# Patient Record
Sex: Female | Born: 1940 | Race: Black or African American | Hispanic: No | Smoking: Never smoker
Health system: Southern US, Community
[De-identification: ages and names within clinical notes are randomized; demographics above are authoritative.]

## PROBLEM LIST (undated history)

## (undated) DIAGNOSIS — K219 Gastro-esophageal reflux disease without esophagitis: Secondary | ICD-10-CM

## (undated) DIAGNOSIS — M1990 Unspecified osteoarthritis, unspecified site: Secondary | ICD-10-CM

## (undated) DIAGNOSIS — E119 Type 2 diabetes mellitus without complications: Secondary | ICD-10-CM

## (undated) DIAGNOSIS — Z9289 Personal history of other medical treatment: Secondary | ICD-10-CM

## (undated) DIAGNOSIS — I10 Essential (primary) hypertension: Secondary | ICD-10-CM

## (undated) DIAGNOSIS — R0600 Dyspnea, unspecified: Secondary | ICD-10-CM

## (undated) DIAGNOSIS — D126 Benign neoplasm of colon, unspecified: Secondary | ICD-10-CM

## (undated) DIAGNOSIS — Z950 Presence of cardiac pacemaker: Secondary | ICD-10-CM

## (undated) DIAGNOSIS — E785 Hyperlipidemia, unspecified: Secondary | ICD-10-CM

## (undated) DIAGNOSIS — I442 Atrioventricular block, complete: Secondary | ICD-10-CM

## (undated) HISTORY — PX: JOINT REPLACEMENT: SHX530

## (undated) HISTORY — PX: TUBAL LIGATION: SHX77

## (undated) HISTORY — PX: COLONOSCOPY: SHX5424

## (undated) HISTORY — DX: Benign neoplasm of colon, unspecified: D12.6

## (undated) HISTORY — DX: Personal history of other medical treatment: Z92.89

---

## 1999-02-28 ENCOUNTER — Inpatient Hospital Stay (HOSPITAL_COMMUNITY): Admission: EM | Admit: 1999-02-28 | Discharge: 1999-03-02 | Payer: Self-pay | Admitting: *Deleted

## 2003-06-27 ENCOUNTER — Emergency Department (HOSPITAL_COMMUNITY): Admission: EM | Admit: 2003-06-27 | Discharge: 2003-06-27 | Payer: Self-pay | Admitting: *Deleted

## 2003-06-27 ENCOUNTER — Encounter: Payer: Self-pay | Admitting: *Deleted

## 2004-12-13 ENCOUNTER — Emergency Department (HOSPITAL_COMMUNITY): Admission: EM | Admit: 2004-12-13 | Discharge: 2004-12-14 | Payer: Self-pay | Admitting: *Deleted

## 2006-03-08 ENCOUNTER — Ambulatory Visit (HOSPITAL_COMMUNITY): Admission: RE | Admit: 2006-03-08 | Discharge: 2006-03-08 | Payer: Self-pay | Admitting: Family Medicine

## 2006-03-29 ENCOUNTER — Ambulatory Visit (HOSPITAL_COMMUNITY): Admission: RE | Admit: 2006-03-29 | Discharge: 2006-03-29 | Payer: Self-pay | Admitting: Family Medicine

## 2006-10-25 ENCOUNTER — Ambulatory Visit (HOSPITAL_COMMUNITY): Admission: RE | Admit: 2006-10-25 | Discharge: 2006-10-25 | Payer: Self-pay | Admitting: Family Medicine

## 2007-03-26 ENCOUNTER — Encounter: Admission: RE | Admit: 2007-03-26 | Discharge: 2007-03-26 | Payer: Self-pay | Admitting: Family Medicine

## 2008-02-17 ENCOUNTER — Emergency Department (HOSPITAL_COMMUNITY): Admission: EM | Admit: 2008-02-17 | Discharge: 2008-02-17 | Payer: Self-pay | Admitting: Emergency Medicine

## 2008-03-26 ENCOUNTER — Encounter: Admission: RE | Admit: 2008-03-26 | Discharge: 2008-03-26 | Payer: Self-pay | Admitting: Family Medicine

## 2008-11-25 ENCOUNTER — Ambulatory Visit (HOSPITAL_COMMUNITY): Admission: RE | Admit: 2008-11-25 | Discharge: 2008-11-25 | Payer: Self-pay | Admitting: Gastroenterology

## 2008-11-25 ENCOUNTER — Encounter: Payer: Self-pay | Admitting: Gastroenterology

## 2008-11-25 ENCOUNTER — Ambulatory Visit: Payer: Self-pay | Admitting: Gastroenterology

## 2009-04-08 ENCOUNTER — Ambulatory Visit (HOSPITAL_COMMUNITY): Admission: RE | Admit: 2009-04-08 | Discharge: 2009-04-08 | Payer: Self-pay | Admitting: Family Medicine

## 2009-07-20 ENCOUNTER — Ambulatory Visit: Payer: Self-pay | Admitting: Orthopedic Surgery

## 2009-07-20 DIAGNOSIS — M1710 Unilateral primary osteoarthritis, unspecified knee: Secondary | ICD-10-CM

## 2009-07-20 DIAGNOSIS — IMO0002 Reserved for concepts with insufficient information to code with codable children: Secondary | ICD-10-CM | POA: Insufficient documentation

## 2009-07-20 DIAGNOSIS — I10 Essential (primary) hypertension: Secondary | ICD-10-CM

## 2009-07-20 DIAGNOSIS — M25569 Pain in unspecified knee: Secondary | ICD-10-CM | POA: Insufficient documentation

## 2009-07-20 DIAGNOSIS — E119 Type 2 diabetes mellitus without complications: Secondary | ICD-10-CM | POA: Insufficient documentation

## 2009-10-19 ENCOUNTER — Ambulatory Visit: Payer: Self-pay | Admitting: Orthopedic Surgery

## 2009-11-13 ENCOUNTER — Telehealth: Payer: Self-pay | Admitting: Orthopedic Surgery

## 2010-04-12 ENCOUNTER — Ambulatory Visit (HOSPITAL_COMMUNITY): Admission: RE | Admit: 2010-04-12 | Discharge: 2010-04-12 | Payer: Self-pay | Admitting: Internal Medicine

## 2011-04-11 ENCOUNTER — Other Ambulatory Visit (HOSPITAL_COMMUNITY): Payer: Self-pay | Admitting: Internal Medicine

## 2011-04-11 DIAGNOSIS — Z139 Encounter for screening, unspecified: Secondary | ICD-10-CM

## 2011-04-14 ENCOUNTER — Ambulatory Visit (HOSPITAL_COMMUNITY)
Admission: RE | Admit: 2011-04-14 | Discharge: 2011-04-14 | Disposition: A | Discharge status: Home / Self Care | Payer: Medicare Other | Source: Ambulatory Visit | Attending: Internal Medicine | Admitting: Internal Medicine

## 2011-04-14 DIAGNOSIS — Z1231 Encounter for screening mammogram for malignant neoplasm of breast: Secondary | ICD-10-CM | POA: Insufficient documentation

## 2011-04-14 DIAGNOSIS — Z139 Encounter for screening, unspecified: Secondary | ICD-10-CM

## 2011-05-10 NOTE — Op Note (Signed)
NAMEGIBSON, Natalie Castro                  ACCOUNT NO.:  [id number]   MEDICAL RECORD NO.:  [id number]          PATIENT TYPE:  AMB   LOCATION:  DAY                           FACILITY:  [facility]   PHYSICIAN:  Kassie Mends, M.D.      DATE OF BIRTH:  [birth year]   DATE OF PROCEDURE:  11/25/2008  DATE OF DISCHARGE:                                PROCEDURE NOTE   REFERRING PHYSICIAN:  Scott A. Gerda Diss, MD   PROCEDURE:  Colonoscopy with snare cautery polypectomy.   INDICATION FOR EXAMINATION:  [name] is a 70 year old female who  presents for average-risk colon cancer screening.   FINDINGS:  1. A 6 mm sessile hepatic flexure polyp removed via cold forceps.  2. A 6-mm pedunculated descending colon polyp removed via snare      cautery.  A 4-mm sessile descending colon polyp removed via snare      cautery.  3. Frequent sigmoid colon diverticula.  Otherwise, no masses,      inflammatory changes, or AVM seen.  4. Normal retroflexed view of the rectum.   RECOMMENDATIONS:  1. Will call [name] with the results of her biopsies.  If she has a      simple adenoma then a screening colonoscopy in 10 years.  2. She should follow a high-fiber diet.  She was given a handout on      high-fiber diet and diverticulosis.  3. No aspirin, NSAIDS, or anticoagulation for 7 days.   PROCEDURE TECHNIQUE:  Physical exam was performed.  Informed consent was  obtained from the patient after explaining the benefits, risks, and  alternatives to the procedure.  The patient was connected to the monitor  and placed in the left lateral position.  Continuous oxygen was provided  by nasal cannula.  IV medicine was administered through an indwelling  cannula.  After administration of sedation and rectal exam, the  patient's rectum was intubated and the scope was advanced under direct  visualization to the cecum.  The scope was removed slowly  by carefully examining the color, texture, anatomy, and integrity of the  mucosa on the  way out.  The patient was recovered in Endoscopy and  discharged home in satisfactory condition.   PATH:  Simple adenomas. TCS in 10 yrs.      Kassie Mends, M.D.  Electronically Signed     SM/MEDQ  D:  11/25/2008  T:  11/25/2008  Job:  [id number]   cc:   Lorin Picket A. Gerda Diss, MD  Fax: [phone]

## 2011-09-16 LAB — BASIC METABOLIC PANEL
BUN: 15
CO2: 29
Calcium: 8.7
Chloride: 100
Creatinine, Ser: 0.85
GFR calc Af Amer: >60
GFR calc non Af Amer: >60
Glucose, Bld: 276 — ABNORMAL HIGH
Potassium: 3.3 — ABNORMAL LOW
Sodium: 139

## 2011-09-16 LAB — DIFFERENTIAL
Basophils Absolute: 0
Basophils Relative: 0
Eosinophils Absolute: 0.1
Eosinophils Relative: 1
Lymphocytes Relative: 21
Lymphs Abs: 1.2
Monocytes Absolute: 0.3
Monocytes Relative: 5
Neutro Abs: 4.2
Neutrophils Relative %: 72

## 2011-09-16 LAB — CBC
HCT: 39.1
Hemoglobin: 13.3
MCHC: 34.1
MCV: 85.1
Platelets: 177
RBC: 4.59
RDW: 13.7
WBC: 5.8

## 2011-09-16 LAB — URINALYSIS, ROUTINE W REFLEX MICROSCOPIC
Glucose, UA: 250 — AB
Hgb urine dipstick: NEGATIVE
Ketones, ur: 15 — AB
Nitrite: NEGATIVE
Protein, ur: NEGATIVE
Specific Gravity, Urine: >1.03 — ABNORMAL HIGH
Urobilinogen, UA: 0.2
pH: 5.5

## 2011-09-30 LAB — GLUCOSE, CAPILLARY: Glucose-Capillary: 142 mg/dL — ABNORMAL HIGH (ref 70–99)

## 2012-03-13 ENCOUNTER — Other Ambulatory Visit (HOSPITAL_COMMUNITY): Payer: Self-pay | Admitting: Internal Medicine

## 2012-03-13 DIAGNOSIS — Z139 Encounter for screening, unspecified: Secondary | ICD-10-CM

## 2012-04-16 ENCOUNTER — Ambulatory Visit (HOSPITAL_COMMUNITY)
Admission: RE | Admit: 2012-04-16 | Discharge: 2012-04-16 | Disposition: A | Discharge status: Home / Self Care | Payer: Medicare Other | Source: Ambulatory Visit | Attending: Internal Medicine | Admitting: Internal Medicine

## 2012-04-16 DIAGNOSIS — Z139 Encounter for screening, unspecified: Secondary | ICD-10-CM

## 2012-04-16 DIAGNOSIS — Z1231 Encounter for screening mammogram for malignant neoplasm of breast: Secondary | ICD-10-CM | POA: Insufficient documentation

## 2013-04-29 ENCOUNTER — Other Ambulatory Visit (HOSPITAL_COMMUNITY): Payer: Self-pay | Admitting: Internal Medicine

## 2013-04-29 DIAGNOSIS — Z139 Encounter for screening, unspecified: Secondary | ICD-10-CM

## 2013-05-02 ENCOUNTER — Ambulatory Visit (HOSPITAL_COMMUNITY)
Admission: RE | Admit: 2013-05-02 | Discharge: 2013-05-02 | Disposition: A | Discharge status: Home / Self Care | Payer: Medicare Other | Source: Ambulatory Visit | Attending: Internal Medicine | Admitting: Internal Medicine

## 2013-05-02 DIAGNOSIS — Z1231 Encounter for screening mammogram for malignant neoplasm of breast: Secondary | ICD-10-CM | POA: Insufficient documentation

## 2013-05-02 DIAGNOSIS — Z139 Encounter for screening, unspecified: Secondary | ICD-10-CM

## 2014-04-24 ENCOUNTER — Other Ambulatory Visit (HOSPITAL_COMMUNITY): Payer: Self-pay | Admitting: Internal Medicine

## 2014-04-24 DIAGNOSIS — Z1231 Encounter for screening mammogram for malignant neoplasm of breast: Secondary | ICD-10-CM

## 2014-05-05 ENCOUNTER — Ambulatory Visit (HOSPITAL_COMMUNITY)
Admission: RE | Admit: 2014-05-05 | Discharge: 2014-05-05 | Disposition: A | Discharge status: Home / Self Care | Payer: Medicare Other | Source: Ambulatory Visit | Attending: Internal Medicine | Admitting: Internal Medicine

## 2014-05-05 DIAGNOSIS — Z1231 Encounter for screening mammogram for malignant neoplasm of breast: Secondary | ICD-10-CM | POA: Insufficient documentation

## 2015-01-13 DIAGNOSIS — E119 Type 2 diabetes mellitus without complications: Secondary | ICD-10-CM | POA: Diagnosis not present

## 2015-01-13 DIAGNOSIS — H5203 Hypermetropia, bilateral: Secondary | ICD-10-CM | POA: Diagnosis not present

## 2015-01-13 DIAGNOSIS — H52223 Regular astigmatism, bilateral: Secondary | ICD-10-CM | POA: Diagnosis not present

## 2015-01-13 DIAGNOSIS — H524 Presbyopia: Secondary | ICD-10-CM | POA: Diagnosis not present

## 2015-04-06 ENCOUNTER — Other Ambulatory Visit (HOSPITAL_COMMUNITY): Payer: Self-pay | Admitting: Internal Medicine

## 2015-04-06 DIAGNOSIS — Z1231 Encounter for screening mammogram for malignant neoplasm of breast: Secondary | ICD-10-CM

## 2015-04-09 DIAGNOSIS — E669 Obesity, unspecified: Secondary | ICD-10-CM | POA: Diagnosis not present

## 2015-04-09 DIAGNOSIS — M1990 Unspecified osteoarthritis, unspecified site: Secondary | ICD-10-CM | POA: Diagnosis not present

## 2015-04-09 DIAGNOSIS — E785 Hyperlipidemia, unspecified: Secondary | ICD-10-CM | POA: Diagnosis not present

## 2015-04-09 DIAGNOSIS — I10 Essential (primary) hypertension: Secondary | ICD-10-CM | POA: Diagnosis not present

## 2015-05-11 ENCOUNTER — Ambulatory Visit (HOSPITAL_COMMUNITY)
Admission: RE | Admit: 2015-05-11 | Discharge: 2015-05-11 | Disposition: A | Discharge status: Home / Self Care | Payer: Medicare Other | Source: Ambulatory Visit | Attending: Internal Medicine | Admitting: Internal Medicine

## 2015-05-11 DIAGNOSIS — Z1231 Encounter for screening mammogram for malignant neoplasm of breast: Secondary | ICD-10-CM | POA: Diagnosis not present

## 2015-06-18 ENCOUNTER — Encounter (HOSPITAL_COMMUNITY): Payer: Self-pay | Admitting: *Deleted

## 2015-06-18 ENCOUNTER — Emergency Department (HOSPITAL_COMMUNITY)
Admission: EM | Admit: 2015-06-18 | Discharge: 2015-06-19 | Disposition: A | Discharge status: Home / Self Care | Payer: Medicare Other | Attending: Emergency Medicine | Admitting: Emergency Medicine

## 2015-06-18 DIAGNOSIS — K5730 Diverticulosis of large intestine without perforation or abscess without bleeding: Secondary | ICD-10-CM | POA: Diagnosis not present

## 2015-06-18 DIAGNOSIS — R531 Weakness: Secondary | ICD-10-CM | POA: Diagnosis not present

## 2015-06-18 DIAGNOSIS — R110 Nausea: Secondary | ICD-10-CM | POA: Diagnosis not present

## 2015-06-18 DIAGNOSIS — Z880 Allergy status to penicillin: Secondary | ICD-10-CM | POA: Diagnosis not present

## 2015-06-18 DIAGNOSIS — R109 Unspecified abdominal pain: Secondary | ICD-10-CM | POA: Diagnosis not present

## 2015-06-18 DIAGNOSIS — M6281 Muscle weakness (generalized): Secondary | ICD-10-CM | POA: Diagnosis not present

## 2015-06-18 DIAGNOSIS — E119 Type 2 diabetes mellitus without complications: Secondary | ICD-10-CM | POA: Diagnosis not present

## 2015-06-18 DIAGNOSIS — R1031 Right lower quadrant pain: Secondary | ICD-10-CM | POA: Diagnosis not present

## 2015-06-18 DIAGNOSIS — K219 Gastro-esophageal reflux disease without esophagitis: Secondary | ICD-10-CM | POA: Diagnosis not present

## 2015-06-18 DIAGNOSIS — Z79899 Other long term (current) drug therapy: Secondary | ICD-10-CM | POA: Diagnosis not present

## 2015-06-18 DIAGNOSIS — I10 Essential (primary) hypertension: Secondary | ICD-10-CM | POA: Insufficient documentation

## 2015-06-18 DIAGNOSIS — R42 Dizziness and giddiness: Secondary | ICD-10-CM | POA: Insufficient documentation

## 2015-06-18 HISTORY — DX: Hyperlipidemia, unspecified: E78.5

## 2015-06-18 HISTORY — DX: Gastro-esophageal reflux disease without esophagitis: K21.9

## 2015-06-18 HISTORY — DX: Unspecified osteoarthritis, unspecified site: M19.90

## 2015-06-18 LAB — URINE MICROSCOPIC-ADD ON

## 2015-06-18 LAB — URINALYSIS, ROUTINE W REFLEX MICROSCOPIC
Bilirubin Urine: NEGATIVE
Glucose, UA: NEGATIVE mg/dL
Hgb urine dipstick: NEGATIVE
Nitrite: NEGATIVE
PH: 6 (ref 5.0–8.0)
Protein, ur: NEGATIVE mg/dL
Specific Gravity, Urine: 1.02 (ref 1.005–1.030)
Urobilinogen, UA: 0.2 mg/dL (ref 0.0–1.0)

## 2015-06-18 NOTE — ED Notes (Signed)
Pt reporting pain in right flank area moving into lower abdomen starting about an hour ago.  Reporting some nausea, denies vomiting.  Also reporting small amount of generalized weakness.

## 2015-06-18 NOTE — ED Provider Notes (Signed)
CSN: [id number]     Arrival date & time 06/18/15  2029 History  This chart was scribed for Jola Schmidt, MD by Rayfield Citizen, ED Scribe. This patient was seen in room [facility] and the patient's care was started at 11:43 PM.    Chief Complaint  Patient presents with  . Flank Pain   The history is provided by the patient. No language interpreter was used.     HPI Comments: Natalie Castro is a 74 y.o. female with past medical history of DM, HTN, HLD who presents to the Emergency Department complaining of sudden onset right flank pain, radiating into her RLQ, beginning around 20:00 tonight. Patient notes associated nausea, lightheadedness, and mild generalized weakness. She explains that her pain lasted for approximately 10 minutes before resolving without treatment. She denies vomiting. Patient denies prior history of kidney stones, denies experience of discomfort after eating.   Past Medical History  Diagnosis Date  . Diabetes mellitus without complication   . Hypertension   . Arthritis   . Hyperlipidemia   . Acid reflux    Past Surgical History  Procedure Laterality Date  . Tubal ligation    . Joint replacement     History reviewed. No pertinent family history. History  Substance Use Topics  . Smoking status: Never Smoker   . Smokeless tobacco: Not on file  . Alcohol Use: No   OB History    No data available     Review of Systems  A complete 10 system review of systems was obtained and all systems are negative except as noted in the HPI and PMH.    Allergies  Penicillins  Home Medications   Prior to Admission medications   Medication Sig Start Date End Date Taking? Authorizing Provider  hydrALAZINE (APRESOLINE) 50 MG tablet Take 50 mg by mouth 3 (three) times daily.   Yes Historical Provider, MD  metFORMIN (GLUCOPHAGE) 1000 MG tablet Take 1,000 mg by mouth 2 (two) times daily with a meal.   Yes Historical Provider, MD  metoCLOPramide (REGLAN) 5 MG tablet Take 5 mg by  mouth 4 (four) times daily.   Yes Historical Provider, MD  NIFEdipine (ADALAT CC) 90 MG 24 hr tablet Take 90 mg by mouth daily.   Yes Historical Provider, MD  ranitidine (ZANTAC) 300 MG capsule Take 300 mg by mouth every evening.   Yes Historical Provider, MD   BP 148/70 mmHg  Pulse 78  Temp(Src) 98.7 F (37.1 C) (Oral)  Resp 18  Ht 5\' 4"  (1.626 m)  Wt 196 lb (88.905 kg)  BMI 33.63 kg/m2  SpO2 99% Physical Exam  Constitutional: She is oriented to person, place, and time. She appears well-developed and well-nourished. No distress.  HENT:  Head: Normocephalic and atraumatic.  Eyes: EOM are normal.  Neck: Normal range of motion.  Cardiovascular: Normal rate, regular rhythm and normal heart sounds.   Pulmonary/Chest: Effort normal and breath sounds normal.  Abdominal: Soft. She exhibits no distension. There is tenderness (Mild right-sided abdominal tenderness). There is no rebound and no guarding.  Musculoskeletal: Normal range of motion.  Neurological: She is alert and oriented to person, place, and time.  Skin: Skin is warm and dry.  Psychiatric: She has a normal mood and affect. Judgment normal.  Nursing note and vitals reviewed.   ED Course  Procedures   DIAGNOSTIC STUDIES: Oxygen Saturation is 100% on RA, normal by my interpretation.    COORDINATION OF CARE: 11:47 PM Discussed treatment plan with pt  at bedside and pt agreed to plan.   Labs Review Labs Reviewed  URINALYSIS, ROUTINE W REFLEX MICROSCOPIC (NOT AT [facility]) - Abnormal; Notable for the following:    Ketones, ur TRACE (*)    Leukocytes, UA SMALL (*)    All other components within normal limits  CBC WITH DIFFERENTIAL/PLATELET - Abnormal; Notable for the following:    RBC 3.28 (*)    Hemoglobin 11.4 (*)    HCT 34.1 (*)    MCV 104.0 (*)    MCH 34.8 (*)    All other components within normal limits  COMPREHENSIVE METABOLIC PANEL - Abnormal; Notable for the following:    Potassium 3.3 (*)    Glucose, Bld 162 (*)     Calcium 8.6 (*)    Albumin 3.4 (*)    AST 13 (*)    ALT 11 (*)    All other components within normal limits  URINE MICROSCOPIC-ADD ON - Abnormal; Notable for the following:    Squamous Epithelial / LPF MANY (*)    Bacteria, UA MANY (*)    Casts HYALINE CASTS (*)    All other components within normal limits  LIPASE, BLOOD    Imaging Review Ct Abdomen Pelvis W Contrast  06/19/2015   CLINICAL DATA:  Sudden onset right flank pain radiating into the right lower quadrant.  EXAM: CT ABDOMEN AND PELVIS WITH CONTRAST  TECHNIQUE: Multidetector CT imaging of the abdomen and pelvis was performed using the standard protocol following bolus administration of intravenous contrast.  CONTRAST:  5mL OMNIPAQUE IOHEXOL 300 MG/ML SOLN, 16mL OMNIPAQUE IOHEXOL 300 MG/ML SOLN  COMPARISON:  None.  FINDINGS: Lower chest: Normal heart size. Dependent atelectasis and or scarring within the right lower lobe.  Hepatobiliary: 2 adjacent cysts are identified within the right hepatic lobe measuring up to 2.6 cm. Additional smaller low-attenuation hepatic lesions are identified, too small to characterize. Gallbladder is decompressed. No intrahepatic or extrahepatic biliary ductal dilatation.  Pancreas: Unremarkable  Spleen: Unremarkable  Adrenals/Urinary Tract: Adrenal glands are normal. Kidneys enhance symmetrically with contrast. No hydronephrosis. Urinary bladder is unremarkable.  Stomach/Bowel: Sigmoid colonic diverticulosis. No CT evidence for acute diverticulitis. Normal appendix. No free fluid or free intraperitoneal air.  Vascular/Lymphatic: Normal caliber abdominal aorta. No retroperitoneal lymphadenopathy.  Other: Uterus is unremarkable. Multiple calcifications demonstrated within the adnexal structures bilaterally.  Musculoskeletal: Diastases of the rectus abdominus musculature. Lower lumbar spine degenerative changes. Possible sebaceous cyst left gluteal fat (image 63; series 2)  IMPRESSION: No acute process within  the abdomen or pelvis.  Sigmoid colonic diverticulosis without evidence for acute diverticulitis. Normal appendix.   Electronically Signed   By: Lovey Newcomer M.D.   On: 06/19/2015 01:51  I personally reviewed the imaging tests through PACS system I reviewed available ER/hospitalization records through the EMR    EKG Interpretation None      MDM   Final diagnoses:  Abdominal pain, unspecified abdominal location    2:10 AM Pt is feeling better at this time. Vitals are normal. Ct without acute pathology. Labs and urine without abnormality. Given the acute nature of this one considers ureteral colic that resolved prior to evaluation. Well appearing now. Dc home. pcp follow up. Understands to return to ER for new or worsening symptoms   I personally performed the services described in this documentation, which was scribed in my presence. The recorded information has been reviewed and is accurate.       Jola Schmidt, MD 06/19/15 [phone]

## 2015-06-19 ENCOUNTER — Emergency Department (HOSPITAL_COMMUNITY): Payer: Medicare Other

## 2015-06-19 DIAGNOSIS — K5730 Diverticulosis of large intestine without perforation or abscess without bleeding: Secondary | ICD-10-CM | POA: Diagnosis not present

## 2015-06-19 LAB — LIPASE, BLOOD: LIPASE: 27 U/L (ref 22–51)

## 2015-06-19 LAB — COMPREHENSIVE METABOLIC PANEL
ALT: 11 U/L — ABNORMAL LOW (ref 14–54)
ANION GAP: 7 (ref 5–15)
AST: 13 U/L — ABNORMAL LOW (ref 15–41)
Albumin: 3.4 g/dL — ABNORMAL LOW (ref 3.5–5.0)
Alkaline Phosphatase: 80 U/L (ref 38–126)
BILIRUBIN TOTAL: 0.5 mg/dL (ref 0.3–1.2)
BUN: 15 mg/dL (ref 6–20)
CHLORIDE: 106 mmol/L (ref 101–111)
CO2: 28 mmol/L (ref 22–32)
CREATININE: 0.76 mg/dL (ref 0.44–1.00)
Calcium: 8.6 mg/dL — ABNORMAL LOW (ref 8.9–10.3)
GFR calc Af Amer: >60 mL/min (ref >60)
GFR calc non Af Amer: >60 mL/min (ref >60)
Glucose, Bld: 162 mg/dL — ABNORMAL HIGH (ref 65–99)
Potassium: 3.3 mmol/L — ABNORMAL LOW (ref 3.5–5.1)
Sodium: 141 mmol/L (ref 135–145)
Total Protein: 7.6 g/dL (ref 6.5–8.1)

## 2015-06-19 LAB — CBC WITH DIFFERENTIAL/PLATELET
Basophils Absolute: 0 10*3/uL (ref 0.0–0.1)
Basophils Relative: 0 % (ref 0–1)
EOS ABS: 0.2 10*3/uL (ref 0.0–0.7)
Eosinophils Relative: 2 % (ref 0–5)
HCT: 34.1 % — ABNORMAL LOW (ref 36.0–46.0)
Hemoglobin: 11.4 g/dL — ABNORMAL LOW (ref 12.0–15.0)
LYMPHS ABS: 3 10*3/uL (ref 0.7–4.0)
LYMPHS PCT: 33 % (ref 12–46)
MCH: 34.8 pg — ABNORMAL HIGH (ref 26.0–34.0)
MCHC: 33.4 g/dL (ref 30.0–36.0)
MCV: 104 fL — AB (ref 78.0–100.0)
Monocytes Absolute: 0.5 10*3/uL (ref 0.1–1.0)
Monocytes Relative: 6 % (ref 3–12)
NEUTROS ABS: 5.3 10*3/uL (ref 1.7–7.7)
NEUTROS PCT: 59 % (ref 43–77)
PLATELETS: 204 10*3/uL (ref 150–400)
RBC: 3.28 MIL/uL — AB (ref 3.87–5.11)
RDW: 14.3 % (ref 11.5–15.5)
WBC: 9 10*3/uL (ref 4.0–10.5)

## 2015-06-19 MED ORDER — IOHEXOL 300 MG/ML  SOLN
100.0000 mL | Freq: Once | INTRAMUSCULAR | Status: AC | PRN
  Administered 2015-06-19: 100 mL via INTRAVENOUS

## 2015-06-19 MED ORDER — IOHEXOL 300 MG/ML  SOLN
25.0000 mL | Freq: Once | INTRAMUSCULAR | Status: AC | PRN
  Administered 2015-06-19: 25 mL via ORAL

## 2015-06-19 NOTE — Discharge Instructions (Signed)

## 2015-07-02 DIAGNOSIS — R531 Weakness: Secondary | ICD-10-CM | POA: Diagnosis not present

## 2015-07-02 DIAGNOSIS — E119 Type 2 diabetes mellitus without complications: Secondary | ICD-10-CM | POA: Diagnosis not present

## 2015-07-02 DIAGNOSIS — E669 Obesity, unspecified: Secondary | ICD-10-CM | POA: Diagnosis not present

## 2015-07-02 DIAGNOSIS — I973 Postprocedural hypertension: Secondary | ICD-10-CM | POA: Diagnosis not present

## 2015-07-02 DIAGNOSIS — M545 Low back pain: Secondary | ICD-10-CM | POA: Diagnosis not present

## 2015-07-02 DIAGNOSIS — E785 Hyperlipidemia, unspecified: Secondary | ICD-10-CM | POA: Diagnosis not present

## 2015-07-02 DIAGNOSIS — E1140 Type 2 diabetes mellitus with diabetic neuropathy, unspecified: Secondary | ICD-10-CM | POA: Diagnosis not present

## 2015-07-06 DIAGNOSIS — I10 Essential (primary) hypertension: Secondary | ICD-10-CM | POA: Diagnosis not present

## 2015-07-06 DIAGNOSIS — E1140 Type 2 diabetes mellitus with diabetic neuropathy, unspecified: Secondary | ICD-10-CM | POA: Diagnosis not present

## 2015-07-06 DIAGNOSIS — E785 Hyperlipidemia, unspecified: Secondary | ICD-10-CM | POA: Diagnosis not present

## 2015-07-06 DIAGNOSIS — E669 Obesity, unspecified: Secondary | ICD-10-CM | POA: Diagnosis not present

## 2015-10-05 DIAGNOSIS — Z23 Encounter for immunization: Secondary | ICD-10-CM | POA: Diagnosis not present

## 2015-10-05 DIAGNOSIS — I10 Essential (primary) hypertension: Secondary | ICD-10-CM | POA: Diagnosis not present

## 2015-10-05 DIAGNOSIS — E669 Obesity, unspecified: Secondary | ICD-10-CM | POA: Diagnosis not present

## 2015-10-05 DIAGNOSIS — E1140 Type 2 diabetes mellitus with diabetic neuropathy, unspecified: Secondary | ICD-10-CM | POA: Diagnosis not present

## 2015-10-05 DIAGNOSIS — E785 Hyperlipidemia, unspecified: Secondary | ICD-10-CM | POA: Diagnosis not present

## 2015-10-05 DIAGNOSIS — E1165 Type 2 diabetes mellitus with hyperglycemia: Secondary | ICD-10-CM | POA: Diagnosis not present

## 2015-11-23 ENCOUNTER — Inpatient Hospital Stay
Admission: AD | Admit: 2015-11-23 | Payer: Medicare Other | Source: Other Acute Inpatient Hospital | Admitting: Cardiology

## 2015-11-23 ENCOUNTER — Inpatient Hospital Stay (HOSPITAL_COMMUNITY)
Admission: EM | Admit: 2015-11-23 | Discharge: 2015-11-25 | DRG: 244 | Disposition: A | Discharge status: Home / Self Care | Payer: Medicare Other | Attending: Internal Medicine | Admitting: Internal Medicine

## 2015-11-23 ENCOUNTER — Emergency Department (HOSPITAL_COMMUNITY): Payer: Medicare Other

## 2015-11-23 ENCOUNTER — Encounter (HOSPITAL_COMMUNITY): Payer: Self-pay | Admitting: Emergency Medicine

## 2015-11-23 DIAGNOSIS — E7800 Pure hypercholesterolemia, unspecified: Secondary | ICD-10-CM | POA: Diagnosis not present

## 2015-11-23 DIAGNOSIS — I10 Essential (primary) hypertension: Secondary | ICD-10-CM | POA: Diagnosis not present

## 2015-11-23 DIAGNOSIS — Z7984 Long term (current) use of oral hypoglycemic drugs: Secondary | ICD-10-CM | POA: Diagnosis not present

## 2015-11-23 DIAGNOSIS — Z79899 Other long term (current) drug therapy: Secondary | ICD-10-CM

## 2015-11-23 DIAGNOSIS — R0602 Shortness of breath: Secondary | ICD-10-CM | POA: Diagnosis not present

## 2015-11-23 DIAGNOSIS — R001 Bradycardia, unspecified: Secondary | ICD-10-CM | POA: Diagnosis not present

## 2015-11-23 DIAGNOSIS — R55 Syncope and collapse: Secondary | ICD-10-CM | POA: Diagnosis not present

## 2015-11-23 DIAGNOSIS — Z880 Allergy status to penicillin: Secondary | ICD-10-CM | POA: Diagnosis not present

## 2015-11-23 DIAGNOSIS — I442 Atrioventricular block, complete: Principal | ICD-10-CM

## 2015-11-23 DIAGNOSIS — E785 Hyperlipidemia, unspecified: Secondary | ICD-10-CM

## 2015-11-23 DIAGNOSIS — K219 Gastro-esophageal reflux disease without esophagitis: Secondary | ICD-10-CM | POA: Diagnosis not present

## 2015-11-23 DIAGNOSIS — E119 Type 2 diabetes mellitus without complications: Secondary | ICD-10-CM | POA: Diagnosis not present

## 2015-11-23 DIAGNOSIS — Z96653 Presence of artificial knee joint, bilateral: Secondary | ICD-10-CM | POA: Diagnosis not present

## 2015-11-23 DIAGNOSIS — R0600 Dyspnea, unspecified: Secondary | ICD-10-CM | POA: Diagnosis not present

## 2015-11-23 DIAGNOSIS — Z959 Presence of cardiac and vascular implant and graft, unspecified: Secondary | ICD-10-CM

## 2015-11-23 DIAGNOSIS — Z8249 Family history of ischemic heart disease and other diseases of the circulatory system: Secondary | ICD-10-CM

## 2015-11-23 DIAGNOSIS — Z6834 Body mass index (BMI) 34.0-34.9, adult: Secondary | ICD-10-CM | POA: Diagnosis not present

## 2015-11-23 DIAGNOSIS — Z7982 Long term (current) use of aspirin: Secondary | ICD-10-CM | POA: Diagnosis not present

## 2015-11-23 DIAGNOSIS — E669 Obesity, unspecified: Secondary | ICD-10-CM | POA: Diagnosis present

## 2015-11-23 DIAGNOSIS — J90 Pleural effusion, not elsewhere classified: Secondary | ICD-10-CM | POA: Diagnosis not present

## 2015-11-23 DIAGNOSIS — Z950 Presence of cardiac pacemaker: Secondary | ICD-10-CM | POA: Diagnosis not present

## 2015-11-23 HISTORY — DX: Essential (primary) hypertension: I10

## 2015-11-23 HISTORY — DX: Atrioventricular block, complete: I44.2

## 2015-11-23 HISTORY — DX: Type 2 diabetes mellitus without complications: E11.9

## 2015-11-23 LAB — CBG MONITORING, ED: Glucose-Capillary: 94 mg/dL (ref 65–99)

## 2015-11-23 LAB — CBC WITH DIFFERENTIAL/PLATELET
BASOS ABS: 0 10*3/uL (ref 0.0–0.1)
Basophils Relative: 0 %
Eosinophils Absolute: 0.2 10*3/uL (ref 0.0–0.7)
Eosinophils Relative: 2 %
HEMATOCRIT: 33.9 % — AB (ref 36.0–46.0)
Hemoglobin: 11.1 g/dL — ABNORMAL LOW (ref 12.0–15.0)
LYMPHS PCT: 34 %
Lymphs Abs: 3.1 10*3/uL (ref 0.7–4.0)
MCH: 29.2 pg (ref 26.0–34.0)
MCHC: 32.7 g/dL (ref 30.0–36.0)
MCV: 89.2 fL (ref 78.0–100.0)
MONO ABS: 0.9 10*3/uL (ref 0.1–1.0)
Monocytes Relative: 10 %
NEUTROS ABS: 4.9 10*3/uL (ref 1.7–7.7)
Neutrophils Relative %: 54 %
Platelets: 234 10*3/uL (ref 150–400)
RBC: 3.8 MIL/uL — AB (ref 3.87–5.11)
RDW: 13.4 % (ref 11.5–15.5)
WBC: 9.1 10*3/uL (ref 4.0–10.5)

## 2015-11-23 LAB — APTT: APTT: 25 s (ref 24–37)

## 2015-11-23 LAB — I-STAT CHEM 8, ED
BUN: 25 mg/dL — AB (ref 6–20)
Calcium, Ion: 1.12 mmol/L — ABNORMAL LOW (ref 1.13–1.30)
Chloride: 108 mmol/L (ref 101–111)
Creatinine, Ser: 0.8 mg/dL (ref 0.44–1.00)
Glucose, Bld: 119 mg/dL — ABNORMAL HIGH (ref 65–99)
HEMATOCRIT: 36 % (ref 36.0–46.0)
Hemoglobin: 12.2 g/dL (ref 12.0–15.0)
POTASSIUM: 4 mmol/L (ref 3.5–5.1)
Sodium: 145 mmol/L (ref 135–145)
TCO2: 20 mmol/L (ref 0–100)

## 2015-11-23 LAB — BASIC METABOLIC PANEL
ANION GAP: 9 (ref 5–15)
BUN: 23 mg/dL — ABNORMAL HIGH (ref 6–20)
CO2: 21 mmol/L — ABNORMAL LOW (ref 22–32)
Calcium: 8.5 mg/dL — ABNORMAL LOW (ref 8.9–10.3)
Chloride: 112 mmol/L — ABNORMAL HIGH (ref 101–111)
Creatinine, Ser: 0.87 mg/dL (ref 0.44–1.00)
GFR calc Af Amer: >60 mL/min (ref >60)
GFR calc non Af Amer: >60 mL/min (ref >60)
GLUCOSE: 122 mg/dL — AB (ref 65–99)
POTASSIUM: 3.5 mmol/L (ref 3.5–5.1)
Sodium: 142 mmol/L (ref 135–145)

## 2015-11-23 LAB — I-STAT TROPONIN, ED: Troponin i, poc: 0.02 ng/mL (ref 0.00–0.08)

## 2015-11-23 LAB — TROPONIN I

## 2015-11-23 LAB — TSH: TSH: 0.713 u[IU]/mL (ref 0.350–4.500)

## 2015-11-23 LAB — PROTIME-INR
INR: 1.19 (ref 0.00–1.49)
PROTHROMBIN TIME: 15.3 s — AB (ref 11.6–15.2)

## 2015-11-23 LAB — MAGNESIUM: Magnesium: 1.2 mg/dL — ABNORMAL LOW (ref 1.7–2.4)

## 2015-11-23 MED ORDER — ATROPINE SULFATE 0.1 MG/ML IJ SOLN
INTRAMUSCULAR | Status: AC
  Filled 2015-11-23: qty 10

## 2015-11-23 MED ORDER — FAMOTIDINE 20 MG PO TABS
20.0000 mg | ORAL_TABLET | Freq: Every day | ORAL | Status: DC
  Administered 2015-11-25: 20 mg via ORAL
  Filled 2015-11-23: qty 1

## 2015-11-23 MED ORDER — ATORVASTATIN CALCIUM 40 MG PO TABS: 40.0000 mg | ORAL_TABLET | Freq: Every day | ORAL | Status: DC

## 2015-11-23 MED ORDER — PANTOPRAZOLE SODIUM 40 MG PO TBEC
40.0000 mg | DELAYED_RELEASE_TABLET | Freq: Every day | ORAL | Status: DC
  Administered 2015-11-23 – 2015-11-25 (×2): 40 mg via ORAL
  Filled 2015-11-23 (×2): qty 1

## 2015-11-23 MED ORDER — LOSARTAN POTASSIUM 50 MG PO TABS
100.0000 mg | ORAL_TABLET | Freq: Every day | ORAL | Status: DC
  Filled 2015-11-23 (×2): qty 2

## 2015-11-23 MED ORDER — LOSARTAN POTASSIUM 25 MG PO TABS: 25.0000 mg | ORAL_TABLET | Freq: Every day | ORAL | Status: DC

## 2015-11-23 MED ORDER — ASPIRIN 81 MG PO CHEW
81.0000 mg | CHEWABLE_TABLET | Freq: Once | ORAL | Status: AC
  Administered 2015-11-23: 81 mg via ORAL
  Filled 2015-11-23: qty 1

## 2015-11-23 MED ORDER — METOCLOPRAMIDE HCL 5 MG PO TABS
5.0000 mg | ORAL_TABLET | Freq: Three times a day (TID) | ORAL | Status: DC
  Administered 2015-11-23 – 2015-11-25 (×5): 5 mg via ORAL
  Filled 2015-11-23 (×5): qty 1

## 2015-11-23 MED ORDER — HYDRALAZINE HCL 25 MG PO TABS: 25.0000 mg | ORAL_TABLET | Freq: Two times a day (BID) | ORAL | Status: DC

## 2015-11-23 MED ORDER — ATORVASTATIN CALCIUM 40 MG PO TABS
40.0000 mg | ORAL_TABLET | Freq: Every day | ORAL | Status: DC
  Administered 2015-11-23 – 2015-11-24 (×2): 40 mg via ORAL
  Filled 2015-11-23 (×4): qty 1

## 2015-11-23 MED ORDER — MAGNESIUM SULFATE 4 GM/100ML IV SOLN
4.0000 g | Freq: Once | INTRAVENOUS | Status: AC
  Administered 2015-11-23: 4 g via INTRAVENOUS
  Filled 2015-11-23: qty 100

## 2015-11-23 MED ORDER — HYDRALAZINE HCL 25 MG PO TABS
25.0000 mg | ORAL_TABLET | Freq: Three times a day (TID) | ORAL | Status: DC
  Administered 2015-11-23: 25 mg via ORAL
  Filled 2015-11-23 (×7): qty 1

## 2015-11-23 MED ORDER — METFORMIN HCL 500 MG PO TABS: 1000.0000 mg | ORAL_TABLET | Freq: Two times a day (BID) | ORAL | Status: DC

## 2015-11-23 MED ORDER — METFORMIN HCL 500 MG PO TABS
1000.0000 mg | ORAL_TABLET | Freq: Two times a day (BID) | ORAL | Status: DC
  Filled 2015-11-23: qty 2

## 2015-11-23 NOTE — ED Provider Notes (Signed)
CSN: [id number]     Arrival date & time 11/23/15  1527 History   First MD Initiated Contact with Patient 11/23/15 1533     Chief Complaint  Patient presents with  . Shortness of Breath    Patient is a 74 y.o. female presenting with shortness of breath. The history is provided by the patient and a relative.  Shortness of Breath Severity:  Moderate Onset quality:  Gradual Duration: several days. Timing:  Intermittent Progression:  Worsening Chronicity:  New Relieved by:  Rest Worsened by:  Activity Associated symptoms: syncope   Associated symptoms: no abdominal pain, no chest pain, no fever and no vomiting   Patient reports over past several days she had dyspnea on exertion and fatigue/weakness She reports syncopal episode a month ago, none since No CP No fever/vomiting No new meds She started feeling worse last week and had an appointment with PCP Today who told her to go to the ER She has no previous cardiac hhistory   Past Medical History  Diagnosis Date  . Diabetes mellitus without complication ([place])   . Hypertension   . Arthritis   . Hyperlipidemia   . Acid reflux    Past Surgical History  Procedure Laterality Date  . Tubal ligation    . Joint replacement     History reviewed. No pertinent family history. Social History  Substance Use Topics  . Smoking status: Never Smoker   . Smokeless tobacco: None  . Alcohol Use: No   OB History    No data available     Review of Systems  Constitutional: Positive for fatigue. Negative for fever.  Respiratory: Positive for shortness of breath.   Cardiovascular: Positive for syncope. Negative for chest pain.  Gastrointestinal: Negative for vomiting, abdominal pain and blood in stool.  Neurological: Positive for syncope and weakness.  All other systems reviewed and are negative.     Allergies  Penicillins  Home Medications   Prior to Admission medications   Medication Sig Start Date End Date Taking?  Authorizing Provider  hydrALAZINE (APRESOLINE) 50 MG tablet Take 50 mg by mouth 3 (three) times daily.    Historical Provider, MD  metFORMIN (GLUCOPHAGE) 1000 MG tablet Take 1,000 mg by mouth 2 (two) times daily with a meal.    Historical Provider, MD  metoCLOPramide (REGLAN) 5 MG tablet Take 5 mg by mouth 4 (four) times daily.    Historical Provider, MD  NIFEdipine (ADALAT CC) 90 MG 24 hr tablet Take 90 mg by mouth daily.    Historical Provider, MD  ranitidine (ZANTAC) 300 MG capsule Take 300 mg by mouth every evening.    Historical Provider, MD   BP 155/64 mmHg  Pulse 39  Temp(Src) 97.8 F (36.6 C) (Oral)  Resp 28  Ht 5\' 4"  (1.626 m)  Wt 93.895 kg  BMI 35.51 kg/m2  SpO2 100% Physical Exam CONSTITUTIONAL: Well developed/well nourished HEAD: Normocephalic/atraumatic EYES: EOMI ENMT: Mucous membranes moist NECK: supple no meningeal signs SPINE/BACK:entire spine nontender CV: bradycardic, no murmurs/rubs/gallops noted LUNGS: Lungs are clear to auscultation bilaterally, no apparent distress ABDOMEN: soft, nontender, no rebound or guarding, bowel sounds noted throughout abdomen NEURO: Pt is awake/alert/appropriate, moves all extremitiesx4.  No facial droop.   EXTREMITIES: pulses normal/equal, full ROM SKIN: warm, color normal PSYCH: no abnormalities of mood noted, alert and oriented to situation  ED Course  Procedures  CRITICAL CARE Performed by: Sharyon Cable Total critical care time: 33 minutes Critical care time was exclusive of separately billable  procedures and treating other patients. Critical care was necessary to treat or prevent imminent or life-threatening deterioration. Critical care was time spent personally by me on the following activities: development of treatment plan with patient and/or surrogate as well as nursing, discussions with consultants, evaluation of patient's response to treatment, examination of patient, obtaining history from patient or surrogate,  ordering and performing treatments and interventions, ordering and review of laboratory studies, ordering and review of radiographic studies, pulse oximetry and re-evaluation of patient's condition. PATIENT WITH COMPLETE HEART BLOCK, PT REQUIRED CONSTANT MONITORING, CONSULTED CARDIOLOGY AND REQUIRES TRANSFER TO CARDIAC CENTER  4:04 PM D/w dr Domenic Polite cardiology He reviewed EKG Pt has complete heart block He will arrange transfer to [facility] 4:24 PM Cardiology now evaluating patient for transfer/admission Pt currently without hypotension, she is awake/alert, no distress Will have monitor/pacer at bedside prior to transfer   Labs Review Labs Reviewed  CBC WITH DIFFERENTIAL/PLATELET - Abnormal; Notable for the following:    RBC 3.80 (*)    Hemoglobin 11.1 (*)    HCT 33.9 (*)    All other components within normal limits  I-STAT CHEM 8, ED - Abnormal; Notable for the following:    BUN 25 (*)    Glucose, Bld 119 (*)    Calcium, Ion 1.12 (*)    All other components within normal limits  BASIC METABOLIC PANEL  I-STAT TROPOININ, ED    Imaging Review Dg Chest Portable 1 View  11/23/2015  CLINICAL DATA:  Shortness of breath, dizzy spells for a month. Blacked out. EXAM: PORTABLE CHEST 1 VIEW COMPARISON:  02/17/2008 FINDINGS: There is no focal parenchymal opacity. There is no pleural effusion or pneumothorax. The heart and mediastinal contours are unremarkable. There is mild stable cardiomegaly. IMPRESSION: No active disease. Electronically Signed   By: Kathreen Devoid   On: 11/23/2015 16:15   I have personally reviewed and evaluated these images and lab results as part of my medical decision-making.   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

## 2015-11-23 NOTE — ED Notes (Signed)
Pt states she feels fine as long as she is not moving around. Denies any pain. No needs voiced at this time. Waiting on transport.
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
## 2015-11-23 NOTE — ED Notes (Signed)
Pt states that she has been short of breath for the past few months.  States that she went to the doctor today and they told her to come here for evaluation.

## 2015-11-23 NOTE — ED Notes (Signed)
Cardiology at bedside.

## 2015-11-23 NOTE — Progress Notes (Addendum)
Please see note by Dr. Domenic Polite signed early today 11/28. In extreme brevis, pt is a 74 yo F w/ a PMH significant for HTN and DMII p/w exertional fatigue, dyspnea, and presyncopal symptoms. Symptomatology have been going on for 1 month. Pt passed out on one occasion. Today presented to OSH ER w/ CHB w/ junctional vs. high ventricular escape rhythm in the 30s. BP stable. Asymptomatic at rest. Labs notable for neg CE and TSH WNL. Not on AV nodal agents. Given stable escape rhythm at rest, do not anticipate any overnight events. Will perform TTE to assess for DC PPM vs. CRT-P based on BLOCK-HF. 1.) Admit to ICU/telemetry, transcutaneous pacer pads/atropine at bedside 2.) Hold all AV nodal agents and anti-HTN (not on any at home) 3.) Bed rest 4.) NPO after midnight 5.) AM labs 6.) TTE in AM 7.) DC PPM vs. CRT-P  Jay Schlichter, MD
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
## 2015-11-24 ENCOUNTER — Inpatient Hospital Stay (HOSPITAL_COMMUNITY): Payer: Medicare Other

## 2015-11-24 ENCOUNTER — Encounter (HOSPITAL_COMMUNITY)
Admission: EM | Disposition: A | Discharge status: Home / Self Care | Payer: Medicare Other | Source: Home / Self Care | Attending: Cardiology

## 2015-11-24 DIAGNOSIS — R0600 Dyspnea, unspecified: Secondary | ICD-10-CM

## 2015-11-24 DIAGNOSIS — I442 Atrioventricular block, complete: Secondary | ICD-10-CM

## 2015-11-24 HISTORY — PX: EP IMPLANTABLE DEVICE: SHX172B

## 2015-11-24 LAB — TROPONIN I: Troponin I: 0.03 ng/mL (ref <0.031)

## 2015-11-24 LAB — CBC
HEMATOCRIT: 31 % — AB (ref 36.0–46.0)
HEMOGLOBIN: 10 g/dL — AB (ref 12.0–15.0)
MCH: 28.6 pg (ref 26.0–34.0)
MCHC: 32.3 g/dL (ref 30.0–36.0)
MCV: 88.6 fL (ref 78.0–100.0)
Platelets: 192 10*3/uL (ref 150–400)
RBC: 3.5 MIL/uL — AB (ref 3.87–5.11)
RDW: 13.5 % (ref 11.5–15.5)
WBC: 6.8 10*3/uL (ref 4.0–10.5)

## 2015-11-24 LAB — BASIC METABOLIC PANEL
ANION GAP: 9 (ref 5–15)
BUN: 16 mg/dL (ref 6–20)
CO2: 21 mmol/L — ABNORMAL LOW (ref 22–32)
Calcium: 8.5 mg/dL — ABNORMAL LOW (ref 8.9–10.3)
Chloride: 112 mmol/L — ABNORMAL HIGH (ref 101–111)
Creatinine, Ser: 0.8 mg/dL (ref 0.44–1.00)
GFR calc Af Amer: >60 mL/min (ref >60)
GLUCOSE: 162 mg/dL — AB (ref 65–99)
POTASSIUM: 4.1 mmol/L (ref 3.5–5.1)
Sodium: 142 mmol/L (ref 135–145)

## 2015-11-24 LAB — MAGNESIUM: MAGNESIUM: 2.2 mg/dL (ref 1.7–2.4)

## 2015-11-24 LAB — HEMOGLOBIN A1C
Hgb A1c MFr Bld: 7.6 % — ABNORMAL HIGH (ref 4.8–5.6)
MEAN PLASMA GLUCOSE: 171 mg/dL

## 2015-11-24 LAB — PROTIME-INR
INR: 1.13 (ref 0.00–1.49)
Prothrombin Time: 14.7 seconds (ref 11.6–15.2)

## 2015-11-24 LAB — MRSA PCR SCREENING: MRSA BY PCR: NEGATIVE

## 2015-11-24 SURGERY — PACEMAKER IMPLANT

## 2015-11-24 MED ORDER — CHLORHEXIDINE GLUCONATE 4 % EX LIQD
60.0000 mL | Freq: Once | CUTANEOUS | Status: AC
  Administered 2015-11-24: 4 via TOPICAL

## 2015-11-24 MED ORDER — YOU HAVE A PACEMAKER BOOK
Freq: Once | Status: AC
  Administered 2015-11-25: 02:00:00
  Filled 2015-11-24: qty 1

## 2015-11-24 MED ORDER — HEPARIN (PORCINE) IN NACL 2-0.9 UNIT/ML-% IJ SOLN
INTRAMUSCULAR | Status: AC
  Filled 2015-11-24: qty 500

## 2015-11-24 MED ORDER — ACETAMINOPHEN 325 MG PO TABS: 325.0000 mg | ORAL_TABLET | ORAL | Status: DC | PRN

## 2015-11-24 MED ORDER — SODIUM CHLORIDE 0.9 % IR SOLN
80.0000 mg | Status: AC
  Administered 2015-11-24: 80 mg
  Filled 2015-11-24: qty 2

## 2015-11-24 MED ORDER — LIDOCAINE HCL (PF) 1 % IJ SOLN
INTRAMUSCULAR | Status: AC
  Filled 2015-11-24: qty 30

## 2015-11-24 MED ORDER — SODIUM CHLORIDE 0.9 % IV SOLN
INTRAVENOUS | Status: DC
  Administered 2015-11-24: 11:00:00 via INTRAVENOUS

## 2015-11-24 MED ORDER — LIDOCAINE HCL (PF) 1 % IJ SOLN
INTRAMUSCULAR | Status: AC
  Filled 2015-11-24: qty 60

## 2015-11-24 MED ORDER — CHLORHEXIDINE GLUCONATE 4 % EX LIQD
60.0000 mL | Freq: Once | CUTANEOUS | Status: AC
  Filled 2015-11-24: qty 60

## 2015-11-24 MED ORDER — LIDOCAINE HCL (PF) 1 % IJ SOLN
INTRAMUSCULAR | Status: DC | PRN
  Administered 2015-11-24: 60 mL

## 2015-11-24 MED ORDER — ONDANSETRON HCL 4 MG/2ML IJ SOLN: 4.0000 mg | Freq: Four times a day (QID) | INTRAMUSCULAR | Status: DC | PRN

## 2015-11-24 MED ORDER — SODIUM CHLORIDE 0.9 % IV SOLN
1000.0000 mg | INTRAVENOUS | Status: DC | PRN
  Administered 2015-11-24: 1000 mg via INTRAVENOUS

## 2015-11-24 MED ORDER — VANCOMYCIN HCL IN DEXTROSE 1-5 GM/200ML-% IV SOLN
1000.0000 mg | Freq: Two times a day (BID) | INTRAVENOUS | Status: AC
  Administered 2015-11-25: 1000 mg via INTRAVENOUS
  Filled 2015-11-24: qty 200

## 2015-11-24 MED ORDER — SODIUM CHLORIDE 0.9 % IJ SOLN: 3.0000 mL | INTRAMUSCULAR | Status: DC | PRN

## 2015-11-24 MED ORDER — IOHEXOL 350 MG/ML SOLN
INTRAVENOUS | Status: DC | PRN
  Administered 2015-11-24: 15 mL via INTRAVENOUS

## 2015-11-24 MED ORDER — HYDROCODONE-ACETAMINOPHEN 5-325 MG PO TABS: 1.0000 | ORAL_TABLET | ORAL | Status: DC | PRN

## 2015-11-24 MED ORDER — VANCOMYCIN HCL IN DEXTROSE 1-5 GM/200ML-% IV SOLN
1000.0000 mg | INTRAVENOUS | Status: DC
Start: 2015-11-24 — End: 2015-11-24
  Filled 2015-11-24: qty 200

## 2015-11-24 MED ORDER — SODIUM CHLORIDE 0.9 % IJ SOLN
3.0000 mL | Freq: Two times a day (BID) | INTRAMUSCULAR | Status: DC
  Administered 2015-11-24 – 2015-11-25 (×2): 3 mL via INTRAVENOUS

## 2015-11-24 MED ORDER — CETYLPYRIDINIUM CHLORIDE 0.05 % MT LIQD
7.0000 mL | Freq: Two times a day (BID) | OROMUCOSAL | Status: DC
  Administered 2015-11-24: 7 mL via OROMUCOSAL

## 2015-11-24 MED ORDER — SODIUM CHLORIDE 0.9 % IR SOLN
Status: AC
  Filled 2015-11-24: qty 2

## 2015-11-24 MED ORDER — SODIUM CHLORIDE 0.9 % IV SOLN: 250.0000 mL | INTRAVENOUS | Status: DC | PRN

## 2015-11-24 SURGICAL SUPPLY — 7 items
CABLE SURGICAL S-101-97-12 (CABLE) ×4 IMPLANT
LEAD CAPSURE NOVUS 5076-58CM (Lead) ×2 IMPLANT
LEAD TENDRIL SDX 2088TC-46CM (Lead) ×2 IMPLANT
PAD DEFIB LIFELINK (PAD) ×2 IMPLANT
PPM ASSURITY DR PM2240 (Pacemaker) ×2 IMPLANT
SHEATH CLASSIC 7F (SHEATH) ×4 IMPLANT
TRAY PACEMAKER INSERTION (PACKS) ×2 IMPLANT
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
## 2015-11-24 NOTE — Care Management Note (Signed)
Case Management Note  Patient Details  Name: Natalie Castro MRN: [id number] Date of Birth: 1941/08/07  Subjective/Objective:     Adm w complete heart block               Action/Plan: lives w husband, pcp [name]   Expected Discharge Date:                  Expected Discharge Plan:     In-House Referral:     Discharge planning Services     Post Acute Care Choice:    Choice offered to:     DME Arranged:    DME Agency:     HH Arranged:    [place] Agency:     Status of Service:     Medicare Important Message Given:    Date Medicare IM Given:    Medicare IM give by:    Date Additional Medicare IM Given:    Additional Medicare Important Message give by:     If discussed at [place] of Stay Meetings, dates discussed:    Additional Comments: ur review done  Lacretia Leigh, RN 11/24/2015, 8:47 AM

## 2015-11-24 NOTE — Progress Notes (Signed)
   11/24/15 1200  Clinical Encounter Type  Visited With Patient and family together  Visit Type Initial;Spiritual support  Referral From Chaplain;Nurse  Spiritual Encounters  Spiritual Needs Literature;Prayer  Chaplain visited with Pt and family members; Chaplain ask about Pt overall well being; Chaplain said a prayer while leaving the room; Pt did ask for a bible, Family chimed in a said that she already had a bible of her own; Family and Pt was very appreciative to my visit
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
## 2015-11-24 NOTE — Progress Notes (Signed)
Patient transferred to tele post pacemaker placement. Personal belongings given to daughter. Teeth, article of clothing and bible.

## 2015-11-24 NOTE — Interval H&P Note (Signed)
History and Physical Interval Note:  11/24/2015 10:22 AM  Natalie Castro  has presented today for surgery, with the diagnosis of hb  The various methods of treatment have been discussed with the patient and family. After consideration of risks, benefits and other options for treatment, the patient has consented to  Procedure(s): Pacemaker Implant (N/A) as a surgical intervention .  The patient's history has been reviewed, patient examined, no change in status, stable for surgery.  I have reviewed the patient's chart and labs.  Questions were answered to the patient's satisfaction.     [name]

## 2015-11-24 NOTE — Discharge Instructions (Signed)
° ° °  Supplemental Discharge Instructions for  Pacemaker/Defibrillator Patients  Activity No heavy lifting or vigorous activity with your left/right arm for 6 to 8 weeks.  Do not raise your left/right arm above your head for one week.  Gradually raise your affected arm as drawn below.           __        11-28-15               11-29-15                   11-30-15                  12-01-15  NO DRIVING   WOUND CARE - Keep the wound area clean and dry.  Do not get this area wet for one week. No showers for one week; you may shower on  12-01-15   . - The tape/steri-strips on your wound will fall off; do not pull them off.  No bandage is needed on the site.  DO  NOT apply any creams, oils, or ointments to the wound area. - If you notice any drainage or discharge from the wound, any swelling or bruising at the site, or you develop a fever > 101? F after you are discharged home, call the office at once.  Special Instructions - You are still able to use cellular telephones; use the ear opposite the side where you have your pacemaker/defibrillator.  Avoid carrying your cellular phone near your device. - When traveling through airports, show security personnel your identification card to avoid being screened in the metal detectors.  Ask the security personnel to use the hand wand. - Avoid arc welding equipment, MRI testing (magnetic resonance imaging), TENS units (transcutaneous nerve stimulators).  Call the office for questions about other devices. - Avoid electrical appliances that are in poor condition or are not properly grounded. - Microwave ovens are safe to be near or to operate.

## 2015-11-24 NOTE — Progress Notes (Signed)
  Echocardiogram 2D Echocardiogram has been performed.  Natalie Castro 11/24/2015, 10:17 AM

## 2015-11-25 ENCOUNTER — Other Ambulatory Visit: Payer: Self-pay | Admitting: Physician Assistant

## 2015-11-25 ENCOUNTER — Inpatient Hospital Stay (HOSPITAL_COMMUNITY): Payer: Medicare Other

## 2015-11-25 ENCOUNTER — Encounter (HOSPITAL_COMMUNITY): Payer: Self-pay | Admitting: Internal Medicine

## 2015-11-25 DIAGNOSIS — E876 Hypokalemia: Secondary | ICD-10-CM

## 2015-11-25 LAB — BASIC METABOLIC PANEL
ANION GAP: 8 (ref 5–15)
BUN: 11 mg/dL (ref 6–20)
CO2: 24 mmol/L (ref 22–32)
Calcium: 8.1 mg/dL — ABNORMAL LOW (ref 8.9–10.3)
Chloride: 108 mmol/L (ref 101–111)
Creatinine, Ser: 0.77 mg/dL (ref 0.44–1.00)
GFR calc Af Amer: >60 mL/min (ref >60)
GLUCOSE: 166 mg/dL — AB (ref 65–99)
POTASSIUM: 3.3 mmol/L — AB (ref 3.5–5.1)
Sodium: 140 mmol/L (ref 135–145)

## 2015-11-25 LAB — GLUCOSE, CAPILLARY: Glucose-Capillary: 154 mg/dL — ABNORMAL HIGH (ref 65–99)

## 2015-11-25 MED ORDER — METFORMIN HCL 500 MG PO TABS
1000.0000 mg | ORAL_TABLET | Freq: Two times a day (BID) | ORAL | Status: DC
  Administered 2015-11-25: 1000 mg via ORAL
  Filled 2015-11-25: qty 2

## 2015-11-25 MED ORDER — POTASSIUM CHLORIDE CRYS ER 20 MEQ PO TBCR
40.0000 meq | EXTENDED_RELEASE_TABLET | Freq: Once | ORAL | Status: AC
  Administered 2015-11-25: 40 meq via ORAL
  Filled 2015-11-25 (×2): qty 2

## 2015-11-25 MED FILL — Heparin Sodium (Porcine) 2 Unit/ML in Sodium Chloride 0.9%: INTRAMUSCULAR | Qty: 500 | Status: AC

## 2015-11-25 MED FILL — Lidocaine HCl Local Preservative Free (PF) Inj 1%: INTRAMUSCULAR | Qty: 30 | Status: AC

## 2015-11-25 NOTE — Progress Notes (Signed)
Reviewed discharge instructions with patient and daughter, verbalize understanding. Pain free, pacer site with steri strips clean, dry & intact.  Portable bedside commode sent home with patient.   York Spaniel

## 2015-11-25 NOTE — Care Management Note (Signed)
Case Management Note Previous CM note initiated by Donne Anon RN CM on 11/24/15  Patient Details  Name: DESHAUNDRA HILMAN MRN: [id number] Date of Birth: [birth year]  Subjective/Objective:     Adm w complete heart block               Action/Plan: lives w husband, pcp [name]   Expected Discharge Date:      11/25/15            Expected Discharge Plan:  Home/Self Care  In-House Referral:     Discharge planning Services  CM Consult  Post Acute Care Choice:    Choice offered to:     DME Arranged:    DME Agency:     HH Arranged:    [place]:     Status of Service:  Completed, signed off  Medicare Important Message Given:    Date Medicare IM Given:    Medicare IM give by:    Date Additional Medicare IM Given:    Additional Medicare Important Message give by:     If discussed at [place] of Stay Meetings, dates discussed:    Additional Comments: ur review done  Dawayne Patricia, RN 11/25/2015, 9:50 AM

## 2015-11-25 NOTE — Progress Notes (Signed)
Patient  And daughter  Viewed the pacemaker video.  Mervyn Skeeters, RN

## 2015-11-25 NOTE — Progress Notes (Addendum)
Left leg and left hand remain edematous and warm. Good pulses. :fob up and left leg up on 2 pillows. Left hand up on pillow

## 2015-12-07 ENCOUNTER — Encounter: Payer: Self-pay | Admitting: Internal Medicine

## 2015-12-07 ENCOUNTER — Ambulatory Visit (INDEPENDENT_AMBULATORY_CARE_PROVIDER_SITE_OTHER): Payer: Medicare Other | Admitting: *Deleted

## 2015-12-07 DIAGNOSIS — I442 Atrioventricular block, complete: Secondary | ICD-10-CM | POA: Diagnosis not present

## 2015-12-07 LAB — CUP PACEART INCLINIC DEVICE CHECK
Battery Remaining Longevity: 76.8
Brady Statistic RA Percent Paced: 0.14 %
Brady Statistic RV Percent Paced: 99.81 %
Date Time Interrogation Session: 20161212143613
Implantable Lead Implant Date: 20161129
Implantable Lead Location: 753859
Lead Channel Impedance Value: 400 Ohm
Lead Channel Impedance Value: 475 Ohm
Lead Channel Pacing Threshold Amplitude: 1 V
Lead Channel Pacing Threshold Pulse Width: 0.4 ms
Lead Channel Sensing Intrinsic Amplitude: 5 mV
MDC IDC LEAD IMPLANT DT: 20161129
MDC IDC LEAD LOCATION: 753860
MDC IDC MSMT BATTERY VOLTAGE: 3.01 V
MDC IDC MSMT LEADCHNL RV PACING THRESHOLD AMPLITUDE: 0.75 V
MDC IDC MSMT LEADCHNL RV PACING THRESHOLD AMPLITUDE: 0.75 V
MDC IDC MSMT LEADCHNL RV PACING THRESHOLD PULSEWIDTH: 0.4 ms
MDC IDC MSMT LEADCHNL RV PACING THRESHOLD PULSEWIDTH: 0.4 ms
MDC IDC MSMT LEADCHNL RV SENSING INTR AMPL: 9.6 mV
MDC IDC PG SERIAL: 7826037
MDC IDC SET LEADCHNL RA PACING AMPLITUDE: 2 V
MDC IDC SET LEADCHNL RV PACING AMPLITUDE: 3.5 V
MDC IDC SET LEADCHNL RV PACING PULSEWIDTH: 0.4 ms
MDC IDC SET LEADCHNL RV SENSING SENSITIVITY: 4 mV

## 2015-12-07 NOTE — Progress Notes (Signed)
Wound check appointment. Steri-strips removed. Wound without redness or edema. Incision edges approximated, wound well healed. Normal device function. Thresholds, sensing, and impedances consistent with implant measurements. Device programmed at 3.5V (RV) and auto capture is programmed on (RA) for extra safety margin until 3 month visit. Histogram distribution appropriate for patient and level of activity. (1) mode switch episode x 8 sec @ 192/70--AT. No high ventricular rates noted. Patient educated about wound care, arm mobility, lifting restrictions. ROV in 3 months with GT/R.

## 2015-12-27 DIAGNOSIS — Z86711 Personal history of pulmonary embolism: Secondary | ICD-10-CM

## 2015-12-27 HISTORY — DX: Personal history of pulmonary embolism: Z86.711

## 2016-01-11 DIAGNOSIS — E785 Hyperlipidemia, unspecified: Secondary | ICD-10-CM | POA: Diagnosis not present

## 2016-01-11 DIAGNOSIS — E1140 Type 2 diabetes mellitus with diabetic neuropathy, unspecified: Secondary | ICD-10-CM | POA: Diagnosis not present

## 2016-01-11 DIAGNOSIS — Z950 Presence of cardiac pacemaker: Secondary | ICD-10-CM | POA: Diagnosis not present

## 2016-01-11 DIAGNOSIS — I10 Essential (primary) hypertension: Secondary | ICD-10-CM | POA: Diagnosis not present

## 2016-01-11 DIAGNOSIS — I442 Atrioventricular block, complete: Secondary | ICD-10-CM | POA: Diagnosis not present

## 2016-01-11 DIAGNOSIS — Z23 Encounter for immunization: Secondary | ICD-10-CM | POA: Diagnosis not present

## 2016-01-11 DIAGNOSIS — E1165 Type 2 diabetes mellitus with hyperglycemia: Secondary | ICD-10-CM | POA: Diagnosis not present

## 2016-01-15 DIAGNOSIS — H524 Presbyopia: Secondary | ICD-10-CM | POA: Diagnosis not present

## 2016-01-15 DIAGNOSIS — H52223 Regular astigmatism, bilateral: Secondary | ICD-10-CM | POA: Diagnosis not present

## 2016-01-15 DIAGNOSIS — H5203 Hypermetropia, bilateral: Secondary | ICD-10-CM | POA: Diagnosis not present

## 2016-01-15 DIAGNOSIS — E113293 Type 2 diabetes mellitus with mild nonproliferative diabetic retinopathy without macular edema, bilateral: Secondary | ICD-10-CM | POA: Diagnosis not present

## 2016-02-01 ENCOUNTER — Encounter: Payer: Self-pay | Admitting: Internal Medicine

## 2016-03-04 ENCOUNTER — Ambulatory Visit (INDEPENDENT_AMBULATORY_CARE_PROVIDER_SITE_OTHER): Payer: Medicare Other | Admitting: Internal Medicine

## 2016-03-04 ENCOUNTER — Encounter: Payer: Self-pay | Admitting: Internal Medicine

## 2016-03-04 VITALS — BP 122/72 | HR 95 | Ht 65.0 in | Wt 201.0 lb

## 2016-03-04 DIAGNOSIS — I442 Atrioventricular block, complete: Secondary | ICD-10-CM | POA: Diagnosis not present

## 2016-03-04 LAB — CUP PACEART INCLINIC DEVICE CHECK
Date Time Interrogation Session: 20170328080158
Implantable Lead Implant Date: 20161129
MDC IDC LEAD IMPLANT DT: 20161129
MDC IDC LEAD LOCATION: 753859
MDC IDC LEAD LOCATION: 753860
MDC IDC PG SERIAL: 7826037
Pulse Gen Model: 2240

## 2016-03-04 NOTE — Patient Instructions (Signed)
Your physician wants you to follow-up in: 9 Months with Dr. Lovena Le.  You will receive a reminder letter in the mail two months in advance. If you don't receive a letter, please call our office to schedule the follow-up appointment.  Your physician recommends that you continue on your current medications as directed. Please refer to the Current Medication list given to you today.  Remote monitoring is used to monitor your Pacemaker of ICD from home. This monitoring reduces the number of office visits required to check your device to one time per year. It allows [place] to keep an eye on the functioning of your device to ensure it is working properly. You are scheduled for a device check from home on 06/06/16. You may send your transmission at any time that day. If you have a wireless device, the transmission will be sent automatically. After your physician reviews your transmission, you will receive a postcard with your next transmission date.  If you need a refill on your cardiac medications before your next appointment, please call your pharmacy.  Thank you for choosing [facility]!

## 2016-03-04 NOTE — Progress Notes (Signed)
HPI Natalie Castro presents today for ongoing evaluation and management of her PPM. She is a pleasant 75 yo woman with a h/o HTN and DM who developed syncope and sob and was found to have CHB, and underwent PPM insertion. She has been stable in the interim but does note that she will feel thumping on her left side when she turns onto this side while trying to sleep. No chest pain. Allergies  Allergen Reactions  . Penicillins Hives and Itching     Current Outpatient Prescriptions  Medication Sig Dispense Refill  . aspirin EC 81 MG tablet Take 81 mg by mouth every morning.    Marland Kitchen atorvastatin (LIPITOR) 40 MG tablet Take 40 mg by mouth at bedtime.     . hydrALAZINE (APRESOLINE) 25 MG tablet Take 25 mg by mouth 2 (two) times daily.    Marland Kitchen LANTUS SOLOSTAR 100 UNIT/ML Solostar Pen Inject 25 Units into the skin at bedtime.     Marland Kitchen losartan (COZAAR) 100 MG tablet Take 100 mg by mouth every morning.     . metFORMIN (GLUCOPHAGE) 1000 MG tablet Take 1,000 mg by mouth 2 (two) times daily with a meal.    . metoCLOPramide (REGLAN) 5 MG tablet Take 5 mg by mouth 4 (four) times daily.    Marland Kitchen NIFEdipine (PROCARDIA XL/ADALAT-CC) 90 MG 24 hr tablet Take 90 mg by mouth every morning.     . ranitidine (ZANTAC) 300 MG tablet Take 300 mg by mouth every morning.      No current facility-administered medications for this visit.     Past Medical History  Diagnosis Date  . Type 2 diabetes mellitus ([place])   . Essential hypertension   . Arthritis   . Hyperlipidemia   . Acid reflux   . Complete heart block (HCC)     STJ PPM Dr. [name] 11/24/15    ROS:   All systems reviewed and negative except as noted in the HPI.   Past Surgical History  Procedure Laterality Date  . Tubal ligation    . Joint replacement      knees bilat.  Marland Kitchen Ep implantable device N/A 11/24/2015    Procedure: Pacemaker Implant;  Surgeon: [name], MD;  Location: [facility];  Service: Cardiovascular;  Laterality: N/A;      Family History  Problem Relation Age of Onset  . Heart failure Mother   . Heart failure Sister   . Hypertension Mother   . Hypertension Father      Social History   Social History  . Marital Status: Married    Spouse Name: N/A  . Number of Children: N/A  . Years of Education: N/A   Occupational History  . Not on file.   Social History Main Topics  . Smoking status: Never Smoker   . Smokeless tobacco: Not on file  . Alcohol Use: No  . Drug Use: No  . Sexual Activity: Yes    Birth Control/ Protection: Post-menopausal   Other Topics Concern  . Not on file   Social History Narrative     BP 122/72 mmHg  Pulse 95  Ht 5\' 5"  (1.651 m)  Wt 201 lb (91.173 kg)  BMI 33.45 kg/m2  SpO2 99%  Physical Exam:  Well appearing NAD HEENT: Unremarkable Neck:  No JVD, no thyromegally Lymphatics:  No adenopathy Back:  No CVA tenderness Lungs:  Clear with no wheezes, well healed PPM incision. HEART:  Regular rate rhythm, no murmurs, no  rubs, no clicks Abd:  soft, positive bowel sounds, no organomegally, no rebound, no guarding Ext:  2 plus pulses, no edema, no cyanosis, no clubbing Skin:  No rashes no nodules Neuro:  CN II through XII intact, motor grossly intact  DEVICE  Normal device function.  See PaceArt for details.   Assess/Plan: 1. Complete heart block - she is doing well, s/p PPM insertion. She has some conduction. 2. Syncope - none since her PPM insertion 3. Diaphragmatic stimulation - this is fairly mild. We were able to reproduce with her outputs turned up. We have reduce her pacing outputs and will allow her to conduct some by increasing her AV delay. 4. HTN - her blood pressure today is well controlled. Will follow.  Mikle Bosworth.D.

## 2016-04-04 DIAGNOSIS — E1140 Type 2 diabetes mellitus with diabetic neuropathy, unspecified: Secondary | ICD-10-CM | POA: Diagnosis not present

## 2016-04-04 DIAGNOSIS — Z950 Presence of cardiac pacemaker: Secondary | ICD-10-CM | POA: Diagnosis not present

## 2016-04-04 DIAGNOSIS — I10 Essential (primary) hypertension: Secondary | ICD-10-CM | POA: Diagnosis not present

## 2016-04-21 ENCOUNTER — Other Ambulatory Visit (HOSPITAL_COMMUNITY): Payer: Self-pay | Admitting: Internal Medicine

## 2016-04-21 DIAGNOSIS — Z1231 Encounter for screening mammogram for malignant neoplasm of breast: Secondary | ICD-10-CM

## 2016-05-12 ENCOUNTER — Ambulatory Visit (HOSPITAL_COMMUNITY): Payer: Medicare Other

## 2016-05-19 ENCOUNTER — Ambulatory Visit (HOSPITAL_COMMUNITY)
Admission: RE | Admit: 2016-05-19 | Discharge: 2016-05-19 | Disposition: A | Discharge status: Home / Self Care | Payer: Medicare Other | Source: Ambulatory Visit | Attending: Internal Medicine | Admitting: Internal Medicine

## 2016-05-19 DIAGNOSIS — Z1231 Encounter for screening mammogram for malignant neoplasm of breast: Secondary | ICD-10-CM | POA: Insufficient documentation

## 2016-06-06 ENCOUNTER — Ambulatory Visit (INDEPENDENT_AMBULATORY_CARE_PROVIDER_SITE_OTHER): Payer: Medicare Other | Admitting: *Deleted

## 2016-06-06 ENCOUNTER — Telehealth: Payer: Self-pay | Admitting: Cardiology

## 2016-06-06 DIAGNOSIS — Z959 Presence of cardiac and vascular implant and graft, unspecified: Secondary | ICD-10-CM | POA: Diagnosis not present

## 2016-06-06 DIAGNOSIS — I442 Atrioventricular block, complete: Secondary | ICD-10-CM | POA: Diagnosis not present

## 2016-06-06 NOTE — Telephone Encounter (Signed)
Spoke with pt and reminded pt of remote transmission that is due today. Pt verbalized understanding.   

## 2016-06-07 NOTE — Progress Notes (Signed)
Remote pacemaker transmission.   

## 2016-06-13 LAB — CUP PACEART REMOTE DEVICE CHECK
Implantable Lead Implant Date: 20161129
Implantable Lead Location: 753860
Implantable Lead Model: 5076
Lead Channel Impedance Value: 400 Ohm
Lead Channel Impedance Value: 550 Ohm
Lead Channel Pacing Threshold Amplitude: 0.875 V
Lead Channel Pacing Threshold Pulse Width: 0.4 ms
Lead Channel Sensing Intrinsic Amplitude: 5 mV
Lead Channel Setting Pacing Amplitude: 3.5 V
MDC IDC LEAD IMPLANT DT: 20161129
MDC IDC LEAD LOCATION: 753859
MDC IDC MSMT LEADCHNL RV SENSING INTR AMPL: 10.2 mV
MDC IDC SESS DTM: 20170619153910
MDC IDC SET LEADCHNL RA PACING AMPLITUDE: 2 V
MDC IDC SET LEADCHNL RV PACING PULSEWIDTH: 0.4 ms
MDC IDC SET LEADCHNL RV SENSING SENSITIVITY: 4 mV
MDC IDC STAT BRADY RA PERCENT PACED: 1 % — AB
MDC IDC STAT BRADY RV PERCENT PACED: 62 %
Pulse Gen Serial Number: 7826037

## 2016-07-13 DIAGNOSIS — I442 Atrioventricular block, complete: Secondary | ICD-10-CM | POA: Diagnosis not present

## 2016-07-13 DIAGNOSIS — I10 Essential (primary) hypertension: Secondary | ICD-10-CM | POA: Diagnosis not present

## 2016-07-13 DIAGNOSIS — Z950 Presence of cardiac pacemaker: Secondary | ICD-10-CM | POA: Diagnosis not present

## 2016-07-13 DIAGNOSIS — E1140 Type 2 diabetes mellitus with diabetic neuropathy, unspecified: Secondary | ICD-10-CM | POA: Diagnosis not present

## 2016-09-06 ENCOUNTER — Telehealth: Payer: Self-pay | Admitting: Cardiology

## 2016-09-06 ENCOUNTER — Ambulatory Visit (INDEPENDENT_AMBULATORY_CARE_PROVIDER_SITE_OTHER): Payer: Medicare Other | Admitting: *Deleted

## 2016-09-06 DIAGNOSIS — I442 Atrioventricular block, complete: Secondary | ICD-10-CM

## 2016-09-06 NOTE — Telephone Encounter (Signed)
Attempted to confirm remote transmission with pt. No answer and was unable to leave a message.   

## 2016-09-09 ENCOUNTER — Encounter: Payer: Self-pay | Admitting: Cardiology

## 2016-09-09 NOTE — Progress Notes (Signed)
Remote pacemaker transmission.   

## 2016-09-16 LAB — CUP PACEART REMOTE DEVICE CHECK
Battery Remaining Longevity: 115 mo
Battery Remaining Percentage: 95.5 %
Battery Voltage: 3.01 V
Brady Statistic AP VP Percent: 1 %
Brady Statistic AP VS Percent: 1 %
Brady Statistic RA Percent Paced: 1 %
Brady Statistic RV Percent Paced: 40 %
Implantable Lead Implant Date: 20161129
Implantable Lead Implant Date: 20161129
Implantable Lead Location: 753859
Lead Channel Impedance Value: 360 Ohm
Lead Channel Impedance Value: 490 Ohm
Lead Channel Pacing Threshold Amplitude: 0.75 V
Lead Channel Pacing Threshold Amplitude: 0.75 V
Lead Channel Pacing Threshold Pulse Width: 0.4 ms
Lead Channel Sensing Intrinsic Amplitude: 5 mV
Lead Channel Setting Pacing Pulse Width: 0.4 ms
Lead Channel Setting Sensing Sensitivity: 4 mV
MDC IDC LEAD LOCATION: 753860
MDC IDC MSMT LEADCHNL RV PACING THRESHOLD PULSEWIDTH: 0.4 ms
MDC IDC MSMT LEADCHNL RV SENSING INTR AMPL: 9.3 mV
MDC IDC PG SERIAL: 7826037
MDC IDC SESS DTM: 20170912173751
MDC IDC SET LEADCHNL RA PACING AMPLITUDE: 1.75 V
MDC IDC SET LEADCHNL RV PACING AMPLITUDE: 2 V
MDC IDC STAT BRADY AS VP PERCENT: 39 %
MDC IDC STAT BRADY AS VS PERCENT: 60 %

## 2016-10-10 DIAGNOSIS — E1140 Type 2 diabetes mellitus with diabetic neuropathy, unspecified: Secondary | ICD-10-CM | POA: Diagnosis not present

## 2016-10-10 DIAGNOSIS — I442 Atrioventricular block, complete: Secondary | ICD-10-CM | POA: Diagnosis not present

## 2016-10-10 DIAGNOSIS — Z23 Encounter for immunization: Secondary | ICD-10-CM | POA: Diagnosis not present

## 2016-10-10 DIAGNOSIS — Z950 Presence of cardiac pacemaker: Secondary | ICD-10-CM | POA: Diagnosis not present

## 2016-10-10 DIAGNOSIS — I10 Essential (primary) hypertension: Secondary | ICD-10-CM | POA: Diagnosis not present

## 2016-11-04 ENCOUNTER — Emergency Department (HOSPITAL_COMMUNITY): Payer: Medicare Other

## 2016-11-04 ENCOUNTER — Encounter (HOSPITAL_COMMUNITY): Payer: Self-pay | Admitting: Emergency Medicine

## 2016-11-04 ENCOUNTER — Inpatient Hospital Stay (HOSPITAL_COMMUNITY)
Admission: EM | Admit: 2016-11-04 | Discharge: 2016-11-10 | DRG: 176 | Disposition: A | Discharge status: Skilled Nursing Facility | Payer: Medicare Other | Attending: Internal Medicine | Admitting: Internal Medicine

## 2016-11-04 DIAGNOSIS — I10 Essential (primary) hypertension: Secondary | ICD-10-CM | POA: Diagnosis present

## 2016-11-04 DIAGNOSIS — Z950 Presence of cardiac pacemaker: Secondary | ICD-10-CM | POA: Diagnosis not present

## 2016-11-04 DIAGNOSIS — K219 Gastro-esophageal reflux disease without esophagitis: Secondary | ICD-10-CM | POA: Diagnosis not present

## 2016-11-04 DIAGNOSIS — Z96653 Presence of artificial knee joint, bilateral: Secondary | ICD-10-CM | POA: Diagnosis present

## 2016-11-04 DIAGNOSIS — B964 Proteus (mirabilis) (morganii) as the cause of diseases classified elsewhere: Secondary | ICD-10-CM | POA: Diagnosis present

## 2016-11-04 DIAGNOSIS — I959 Hypotension, unspecified: Secondary | ICD-10-CM | POA: Diagnosis not present

## 2016-11-04 DIAGNOSIS — I82402 Acute embolism and thrombosis of unspecified deep veins of left lower extremity: Secondary | ICD-10-CM | POA: Diagnosis not present

## 2016-11-04 DIAGNOSIS — E119 Type 2 diabetes mellitus without complications: Secondary | ICD-10-CM | POA: Diagnosis not present

## 2016-11-04 DIAGNOSIS — I2602 Saddle embolus of pulmonary artery with acute cor pulmonale: Secondary | ICD-10-CM

## 2016-11-04 DIAGNOSIS — I2692 Saddle embolus of pulmonary artery without acute cor pulmonale: Principal | ICD-10-CM | POA: Diagnosis present

## 2016-11-04 DIAGNOSIS — R293 Abnormal posture: Secondary | ICD-10-CM | POA: Diagnosis not present

## 2016-11-04 DIAGNOSIS — I824Z2 Acute embolism and thrombosis of unspecified deep veins of left distal lower extremity: Secondary | ICD-10-CM | POA: Diagnosis present

## 2016-11-04 DIAGNOSIS — Z743 Need for continuous supervision: Secondary | ICD-10-CM | POA: Diagnosis not present

## 2016-11-04 DIAGNOSIS — R7989 Other specified abnormal findings of blood chemistry: Secondary | ICD-10-CM | POA: Diagnosis present

## 2016-11-04 DIAGNOSIS — R0602 Shortness of breath: Secondary | ICD-10-CM | POA: Diagnosis not present

## 2016-11-04 DIAGNOSIS — Z79899 Other long term (current) drug therapy: Secondary | ICD-10-CM | POA: Diagnosis not present

## 2016-11-04 DIAGNOSIS — R748 Abnormal levels of other serum enzymes: Secondary | ICD-10-CM | POA: Diagnosis present

## 2016-11-04 DIAGNOSIS — Z880 Allergy status to penicillin: Secondary | ICD-10-CM | POA: Diagnosis not present

## 2016-11-04 DIAGNOSIS — E785 Hyperlipidemia, unspecified: Secondary | ICD-10-CM | POA: Diagnosis not present

## 2016-11-04 DIAGNOSIS — Z8249 Family history of ischemic heart disease and other diseases of the circulatory system: Secondary | ICD-10-CM

## 2016-11-04 DIAGNOSIS — R778 Other specified abnormalities of plasma proteins: Secondary | ICD-10-CM | POA: Diagnosis present

## 2016-11-04 DIAGNOSIS — Z86711 Personal history of pulmonary embolism: Secondary | ICD-10-CM | POA: Diagnosis present

## 2016-11-04 DIAGNOSIS — R509 Fever, unspecified: Secondary | ICD-10-CM | POA: Diagnosis not present

## 2016-11-04 DIAGNOSIS — Z7982 Long term (current) use of aspirin: Secondary | ICD-10-CM | POA: Diagnosis not present

## 2016-11-04 DIAGNOSIS — N390 Urinary tract infection, site not specified: Secondary | ICD-10-CM | POA: Diagnosis present

## 2016-11-04 DIAGNOSIS — Z7901 Long term (current) use of anticoagulants: Secondary | ICD-10-CM | POA: Diagnosis not present

## 2016-11-04 DIAGNOSIS — Z794 Long term (current) use of insulin: Secondary | ICD-10-CM

## 2016-11-04 DIAGNOSIS — M6281 Muscle weakness (generalized): Secondary | ICD-10-CM | POA: Diagnosis not present

## 2016-11-04 DIAGNOSIS — M1990 Unspecified osteoarthritis, unspecified site: Secondary | ICD-10-CM | POA: Diagnosis not present

## 2016-11-04 DIAGNOSIS — R279 Unspecified lack of coordination: Secondary | ICD-10-CM | POA: Diagnosis not present

## 2016-11-04 DIAGNOSIS — I2699 Other pulmonary embolism without acute cor pulmonale: Secondary | ICD-10-CM | POA: Diagnosis not present

## 2016-11-04 DIAGNOSIS — R609 Edema, unspecified: Secondary | ICD-10-CM | POA: Diagnosis not present

## 2016-11-04 LAB — COMPREHENSIVE METABOLIC PANEL
ALBUMIN: 3.6 g/dL (ref 3.5–5.0)
ALT: 14 U/L (ref 14–54)
AST: 17 U/L (ref 15–41)
Alkaline Phosphatase: 77 U/L (ref 38–126)
Anion gap: 9 (ref 5–15)
BILIRUBIN TOTAL: 0.9 mg/dL (ref 0.3–1.2)
BUN: 13 mg/dL (ref 6–20)
CHLORIDE: 104 mmol/L (ref 101–111)
CO2: 26 mmol/L (ref 22–32)
Calcium: 9.3 mg/dL (ref 8.9–10.3)
Creatinine, Ser: 0.73 mg/dL (ref 0.44–1.00)
GFR calc Af Amer: >60 mL/min (ref >60)
GFR calc non Af Amer: >60 mL/min (ref >60)
GLUCOSE: 225 mg/dL — AB (ref 65–99)
POTASSIUM: 3.7 mmol/L (ref 3.5–5.1)
Sodium: 139 mmol/L (ref 135–145)
TOTAL PROTEIN: 7.5 g/dL (ref 6.5–8.1)

## 2016-11-04 LAB — CBC WITH DIFFERENTIAL/PLATELET
BASOS ABS: 0 10*3/uL (ref 0.0–0.1)
BASOS PCT: 0 %
Eosinophils Absolute: 0.1 10*3/uL (ref 0.0–0.7)
Eosinophils Relative: 1 %
HEMATOCRIT: 35.6 % — AB (ref 36.0–46.0)
Hemoglobin: 12.3 g/dL (ref 12.0–15.0)
Lymphocytes Relative: 19 %
Lymphs Abs: 1.7 10*3/uL (ref 0.7–4.0)
MCH: 38.9 pg — ABNORMAL HIGH (ref 26.0–34.0)
MCHC: 34.6 g/dL (ref 30.0–36.0)
MCV: 112.7 fL — ABNORMAL HIGH (ref 78.0–100.0)
MONO ABS: 0.5 10*3/uL (ref 0.1–1.0)
Monocytes Relative: 6 %
NEUTROS ABS: 6.7 10*3/uL (ref 1.7–7.7)
NEUTROS PCT: 75 %
Platelets: 184 10*3/uL (ref 150–400)
RBC: 3.16 MIL/uL — ABNORMAL LOW (ref 3.87–5.11)
RDW: 14.1 % (ref 11.5–15.5)
WBC: 9 10*3/uL (ref 4.0–10.5)

## 2016-11-04 LAB — GLUCOSE, CAPILLARY: Glucose-Capillary: 198 mg/dL — ABNORMAL HIGH (ref 65–99)

## 2016-11-04 LAB — I-STAT TROPONIN, ED: TROPONIN I, POC: 0.14 ng/mL — AB (ref 0.00–0.08)

## 2016-11-04 LAB — BRAIN NATRIURETIC PEPTIDE: B Natriuretic Peptide: 64 pg/mL (ref 0.0–100.0)

## 2016-11-04 LAB — TROPONIN I: Troponin I: 0.18 ng/mL (ref <0.03)

## 2016-11-04 LAB — D-DIMER, QUANTITATIVE: D-Dimer, Quant: 12.84 ug/mL-FEU — ABNORMAL HIGH (ref 0.00–0.50)

## 2016-11-04 MED ORDER — ONDANSETRON HCL 4 MG PO TABS: 4.0000 mg | ORAL_TABLET | Freq: Four times a day (QID) | ORAL | Status: DC | PRN

## 2016-11-04 MED ORDER — SODIUM CHLORIDE 0.9% FLUSH
3.0000 mL | Freq: Two times a day (BID) | INTRAVENOUS | Status: DC
  Administered 2016-11-05 – 2016-11-10 (×9): 3 mL via INTRAVENOUS

## 2016-11-04 MED ORDER — HEPARIN BOLUS VIA INFUSION
4000.0000 [IU] | Freq: Once | INTRAVENOUS | Status: AC
  Administered 2016-11-04: 4000 [IU] via INTRAVENOUS

## 2016-11-04 MED ORDER — ACETAMINOPHEN 325 MG PO TABS
650.0000 mg | ORAL_TABLET | Freq: Four times a day (QID) | ORAL | Status: DC | PRN
  Administered 2016-11-06 – 2016-11-09 (×5): 650 mg via ORAL
  Filled 2016-11-04 (×6): qty 2

## 2016-11-04 MED ORDER — ASPIRIN EC 81 MG PO TBEC
81.0000 mg | DELAYED_RELEASE_TABLET | Freq: Every morning | ORAL | Status: DC
  Administered 2016-11-05 – 2016-11-10 (×6): 81 mg via ORAL
  Filled 2016-11-04 (×6): qty 1

## 2016-11-04 MED ORDER — HEPARIN (PORCINE) IN NACL 100-0.45 UNIT/ML-% IJ SOLN
1300.0000 [IU]/h | INTRAMUSCULAR | Status: DC
  Administered 2016-11-04 – 2016-11-08 (×4): 1300 [IU]/h via INTRAVENOUS
  Filled 2016-11-04 (×5): qty 250

## 2016-11-04 MED ORDER — INSULIN ASPART 100 UNIT/ML ~~LOC~~ SOLN
0.0000 [IU] | Freq: Three times a day (TID) | SUBCUTANEOUS | Status: DC
  Administered 2016-11-05: 5 [IU] via SUBCUTANEOUS
  Administered 2016-11-05: 2 [IU] via SUBCUTANEOUS
  Administered 2016-11-05: 3 [IU] via SUBCUTANEOUS
  Administered 2016-11-06 (×2): 2 [IU] via SUBCUTANEOUS
  Administered 2016-11-07: 3 [IU] via SUBCUTANEOUS
  Administered 2016-11-07: 8 [IU] via SUBCUTANEOUS
  Administered 2016-11-07: 5 [IU] via SUBCUTANEOUS
  Administered 2016-11-08: 2 [IU] via SUBCUTANEOUS
  Administered 2016-11-08: 3 [IU] via SUBCUTANEOUS
  Administered 2016-11-08 – 2016-11-10 (×5): 2 [IU] via SUBCUTANEOUS

## 2016-11-04 MED ORDER — OXYCODONE HCL 5 MG PO TABS
5.0000 mg | ORAL_TABLET | ORAL | Status: DC | PRN
  Administered 2016-11-07: 5 mg via ORAL
  Filled 2016-11-04 (×4): qty 1

## 2016-11-04 MED ORDER — METOCLOPRAMIDE HCL 5 MG PO TABS
5.0000 mg | ORAL_TABLET | Freq: Three times a day (TID) | ORAL | Status: DC
  Administered 2016-11-05 – 2016-11-10 (×22): 5 mg via ORAL
  Filled 2016-11-04 (×23): qty 1

## 2016-11-04 MED ORDER — INSULIN ASPART 100 UNIT/ML ~~LOC~~ SOLN
4.0000 [IU] | Freq: Three times a day (TID) | SUBCUTANEOUS | Status: DC
  Administered 2016-11-05 – 2016-11-07 (×8): 4 [IU] via SUBCUTANEOUS

## 2016-11-04 MED ORDER — INSULIN GLARGINE 100 UNIT/ML ~~LOC~~ SOLN
30.0000 [IU] | Freq: Every day | SUBCUTANEOUS | Status: DC
  Administered 2016-11-04 – 2016-11-06 (×3): 30 [IU] via SUBCUTANEOUS
  Filled 2016-11-04 (×4): qty 0.3

## 2016-11-04 MED ORDER — ACETAMINOPHEN 650 MG RE SUPP
650.0000 mg | Freq: Four times a day (QID) | RECTAL | Status: DC | PRN
Start: 2016-11-04 — End: 2016-11-10

## 2016-11-04 MED ORDER — FAMOTIDINE 20 MG PO TABS
20.0000 mg | ORAL_TABLET | Freq: Every day | ORAL | Status: DC
  Administered 2016-11-04 – 2016-11-09 (×6): 20 mg via ORAL
  Filled 2016-11-04 (×6): qty 1

## 2016-11-04 MED ORDER — IOPAMIDOL (ISOVUE-370) INJECTION 76%
100.0000 mL | Freq: Once | INTRAVENOUS | Status: AC | PRN
  Administered 2016-11-04: 100 mL via INTRAVENOUS

## 2016-11-04 MED ORDER — ATORVASTATIN CALCIUM 40 MG PO TABS
40.0000 mg | ORAL_TABLET | Freq: Every day | ORAL | Status: DC
  Administered 2016-11-04 – 2016-11-09 (×6): 40 mg via ORAL
  Filled 2016-11-04 (×6): qty 1

## 2016-11-04 MED ORDER — INSULIN ASPART 100 UNIT/ML ~~LOC~~ SOLN
0.0000 [IU] | Freq: Every day | SUBCUTANEOUS | Status: DC
  Administered 2016-11-08: 2 [IU] via SUBCUTANEOUS

## 2016-11-04 MED ORDER — ONDANSETRON HCL 4 MG/2ML IJ SOLN: 4.0000 mg | Freq: Four times a day (QID) | INTRAMUSCULAR | Status: DC | PRN

## 2016-11-04 MED ORDER — POLYETHYLENE GLYCOL 3350 17 G PO PACK: 17.0000 g | PACK | Freq: Every day | ORAL | Status: DC | PRN

## 2016-11-04 MED ORDER — SODIUM CHLORIDE 0.9 % IV BOLUS (SEPSIS)
1000.0000 mL | Freq: Once | INTRAVENOUS | Status: AC
  Administered 2016-11-04: 1000 mL via INTRAVENOUS

## 2016-11-04 NOTE — Progress Notes (Signed)
ANTICOAGULATION CONSULT NOTE - Initial Consult  Pharmacy Consult for heparin Indication: pulmonary embolus  Allergies  Allergen Reactions  . Penicillins Hives and Itching    Has patient had a PCN reaction causing immediate rash, facial/tongue/throat swelling, SOB or lightheadedness with hypotension: No Has patient had a PCN reaction causing severe rash involving mucus membranes or skin necrosis: Yes Has patient had a PCN reaction that required hospitalization No Has patient had a PCN reaction occurring within the last 10 years: No If all of the above answers are "NO", then may proceed with Cephalosporin use.     Patient Measurements: Height: 5\' 5"  (165.1 cm) Weight: 201 lb (91.2 kg) IBW/kg (Calculated) : 57 Heparin Dosing Weight: 77 kg  Vital Signs: Temp: 98.1 F (36.7 C) (11/10 1542) Temp Source: Oral (11/10 1542) BP: 111/65 (11/10 1830) Pulse Rate: 109 (11/10 1830)  Labs:  Recent Labs  11/04/16 1633  HGB 12.3  HCT 35.6*  PLT 184  CREATININE 0.73  TROPONINI 0.18*    Estimated Creatinine Clearance: 68.9 mL/min (by C-G formula based on SCr of 0.73 mg/dL).   Medical History: Past Medical History:  Diagnosis Date  . Acid reflux   . Arthritis   . Complete heart block (HCC)    STJ PPM Dr. [name] 11/24/15  . Essential hypertension   . Hyperlipidemia   . Type 2 diabetes mellitus (HCC)     Medications:  See medication history  Assessment: 75 yo lady to start heparin for submassive PE.  CT shows evidence of right heart strain. Goal of Therapy:  Heparin level 0.3-0.7 units/ml Monitor platelets by anticoagulation protocol: Yes   Plan:  Heparin bolus 4000 units and drip at 1300 units/hr Check heparin level ~ 6-8 hours after start  Daily HL and CBC Monitor for bleeding complications  Ysidro Ramsay Poteet 11/04/2016,7:58 PM

## 2016-11-04 NOTE — ED Provider Notes (Signed)
[facility] DEPT Provider Note   CSN: [id number] Arrival date & time: 11/04/16  1533     History   Chief Complaint Chief Complaint  Patient presents with  . Weakness    HPI ORADELL SKOGSTAD is a 75 y.o. female.   Weakness  Primary symptoms include dizziness. This is a new problem. The current episode started 3 to 5 hours ago. The problem has not changed since onset.There was no focality noted. There has been no fever. Associated symptoms include shortness of breath and chest pain. There were no medications administered prior to arrival. Associated medical issues do not include trauma.    Past Medical History:  Diagnosis Date  . Acid reflux   . Arthritis   . Complete heart block (HCC)    STJ PPM Dr. [name] 11/24/15  . Essential hypertension   . Hyperlipidemia   . Type 2 diabetes mellitus [facility])     Patient Active Problem List   Diagnosis Date Noted  . Saddle pulmonary embolus ([place]) 11/04/2016  . Elevated troponin I level 11/04/2016  . Complete heart block ([place]) 11/23/2015  . Diabetes type 2, controlled ([place]) 07/20/2009  . KNEE, ARTHRITIS, 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  . KNEE PAIN 07/20/2009  . Essential hypertension 07/20/2009    Past Surgical History:  Procedure Laterality Date  . EP IMPLANTABLE DEVICE N/A 11/24/2015   Procedure: Pacemaker Implant;  Surgeon: [name], MD;  Location: [facility];  Service: Cardiovascular;  Laterality: N/A;  . JOINT REPLACEMENT     knees bilat.  . TUBAL LIGATION      OB History    Gravida Para Term Preterm AB Living   5 5 5     4    SAB TAB Ectopic Multiple Live Births                   Home Medications    Prior to Admission medications   Medication Sig Start Date End Date Taking? Authorizing Provider  aspirin EC 81 MG tablet Take 81 mg by mouth every morning.   Yes Historical Provider, MD  ibuprofen (ADVIL,MOTRIN) 200 MG tablet Take 200 mg by mouth every 6 (six) hours as needed for mild pain or moderate pain.    Yes Historical Provider, MD  LANTUS SOLOSTAR 100 UNIT/ML Solostar Pen Inject 30 Units into the skin at bedtime.  11/16/15  Yes Historical Provider, MD  metFORMIN (GLUCOPHAGE) 1000 MG tablet Take 1,000 mg by mouth 2 (two) times daily with a meal.   Yes Historical Provider, MD  atorvastatin (LIPITOR) 40 MG tablet Take 40 mg by mouth at bedtime.  11/02/15   Historical Provider, MD  hydrALAZINE (APRESOLINE) 25 MG tablet Take 25 mg by mouth 2 (two) times daily. 11/02/15   Historical Provider, MD  losartan (COZAAR) 100 MG tablet Take 100 mg by mouth every morning.  11/02/15   Historical Provider, MD  metoCLOPramide (REGLAN) 5 MG tablet Take 5 mg by mouth 4 (four) times daily.    Historical Provider, MD  NIFEdipine (PROCARDIA XL/ADALAT-CC) 90 MG 24 hr tablet Take 90 mg by mouth every morning.  11/02/15   Historical Provider, MD  ranitidine (ZANTAC) 300 MG tablet Take 300 mg by mouth every morning.  11/02/15   Historical Provider, MD    Family History Family History  Problem Relation Age of Onset  . Heart failure Mother   . Hypertension Mother   . Heart failure Sister   . Hypertension Father     Social History Social History  Substance Use Topics  . Smoking status: Never Smoker  . Smokeless tobacco: Never Used  . Alcohol use No     Allergies   Penicillins   Review of Systems Review of Systems  Respiratory: Positive for shortness of breath.   Cardiovascular: Positive for chest pain.  Neurological: Positive for dizziness and weakness.  All other systems reviewed and are negative.    Physical Exam Updated Vital Signs BP 118/71 (BP Location: Left Arm)   Pulse (!) 108   Temp 98.8 F (37.1 C) (Oral)   Resp (!) 24   Ht 5\' 5"  (1.651 m)   Wt 201 lb (91.2 kg)   SpO2 95%   BMI 33.45 kg/m   Physical Exam  Constitutional: She is oriented to person, place, and time. She appears well-developed and well-nourished. No distress.  HENT:  Head: Normocephalic and atraumatic.  Eyes:  Conjunctivae and EOM are normal. Pupils are equal, round, and reactive to light.  Neck: Neck supple.  Cardiovascular: Regular rhythm.  Tachycardia present.   No murmur heard. Pulmonary/Chest: Effort normal and breath sounds normal. Tachypnea noted. No respiratory distress.  Abdominal: Soft. There is no tenderness.  Musculoskeletal: She exhibits no edema.  Neurological: She is alert and oriented to person, place, and time.  Skin: Skin is warm and dry.  Psychiatric: She has a normal mood and affect.  Nursing note and vitals reviewed.    ED Treatments / Results  Labs (all labs ordered are listed, but only abnormal results are displayed) Labs Reviewed  CBC WITH DIFFERENTIAL/PLATELET - Abnormal; Notable for the following:       Result Value   RBC 3.16 (*)    HCT 35.6 (*)    MCV 112.7 (*)    MCH 38.9 (*)    All other components within normal limits  COMPREHENSIVE METABOLIC PANEL - Abnormal; Notable for the following:    Glucose, Bld 225 (*)    All other components within normal limits  TROPONIN I - Abnormal; Notable for the following:    Troponin I 0.18 (*)    All other components within normal limits  D-DIMER, QUANTITATIVE (NOT AT [facility]) - Abnormal; Notable for the following:    D-Dimer, Quant 12.84 (*)    All other components within normal limits  I-STAT TROPOININ, ED - Abnormal; Notable for the following:    Troponin i, poc 0.14 (*)    All other components within normal limits  BRAIN NATRIURETIC PEPTIDE  HEPARIN LEVEL (UNFRACTIONATED)  CBC  TROPONIN I  TROPONIN I  TROPONIN I    EKG  EKG Interpretation  Date/Time:  Friday November 04 2016 15:47:16 EST Ventricular Rate:  134 PR Interval:    QRS Duration: 140 QT Interval:  374 QTC Calculation: 558 R Axis:   -77 Text Interpretation:  Wide QRS tachycardia Left axis deviation Left bundle branch block Abnormal ECG Confirmed by Elvert Cumpton MD, [name] [phone] on 11/04/2016 4:28:32 PM       Radiology Ct Angio Chest Pe W  And/or Wo Contrast  Result Date: 11/04/2016 CLINICAL DATA:  Weakness and dyspnea since this morning. EXAM: CT ANGIOGRAPHY CHEST WITH CONTRAST TECHNIQUE: Multidetector CT imaging of the chest was performed using the standard protocol during bolus administration of intravenous contrast. Multiplanar CT image reconstructions and MIPs were obtained to evaluate the vascular anatomy. CONTRAST:  100 cc IV Isovue 370 COMPARISON:  Same day chest radiograph CT abdomen from 06/19/2015 FINDINGS: Cardiovascular: There is a large saddle embolus with extension into the right and left  lobar and right proximal segmental pulmonary arteries. RV/ LV ratio 1.62 consistent with right heart strain. The heart is enlarged. Aortic atherosclerosis without dissection. No pericardial effusion. Mediastinum/Nodes: Unremarkable esophagus and trachea. Substernal goiter. More No adenopathy. Lungs/Pleura: No pneumothorax. Minimal right and left lower lobe atelectasis. No pneumonic consolidation or dominant mass. Upper Abdomen: Stable pair of right hepatic lobe cysts the largest measuring 2.6 cm have remained stable. The gallbladder is physiologically distended. The visualized spleen and pancreas as well as adrenal glands are without acute appearing abnormalities. Musculoskeletal: No chest wall abnormality. No acute osseous abnormality. Multilevel mild degenerative disc disease of the visualized dorsal spine. Review of the MIP images confirms the above findings. IMPRESSION: Positive for acute PE with CT evidence of right heart strain (RV/LV Ratio = 1.6) consistent with at least submassive (intermediate risk) PE. The presence of right heart strain has been associated with an increased risk of morbidity and mortality. Saddle pulmonary embolus with extension into bilateral lower lobar and proximal right lower segmental pulmonary arteries. Critical Value/emergent results were called by telephone at the time of interpretation on 11/04/2016 at 7:29 pm to  Dr. Merrily Pew , who verbally acknowledged these results. Electronically Signed   By: Ashley Royalty M.D.   On: 11/04/2016 19:30   Dg Chest Portable 1 View  Result Date: 11/04/2016 CLINICAL DATA:  Tachycardia and shortness of breath EXAM: PORTABLE CHEST 1 VIEW COMPARISON:  November 25, 2015 FINDINGS: Mild cardiomegaly. Stable pacemaker. No pneumothorax. No pulmonary nodules, masses, or focal infiltrates. IMPRESSION: No active disease. Electronically Signed   By: Dorise Bullion III M.D   On: 11/04/2016 17:23    Procedures Procedures (including critical care time)  CRITICAL CARE Performed by: Merrily Pew Total critical care time: 35 minutes Critical care time was exclusive of separately billable procedures and treating other patients. Critical care was necessary to treat or prevent imminent or life-threatening deterioration. Critical care was time spent personally by me on the following activities: development of treatment plan with patient and/or surrogate as well as nursing, discussions with consultants, evaluation of patient's response to treatment, examination of patient, obtaining history from patient or surrogate, ordering and performing treatments and interventions, ordering and review of laboratory studies, ordering and review of radiographic studies, pulse oximetry and re-evaluation of patient's condition.   Medications Ordered in ED Medications  heparin ADULT infusion 100 units/mL (25000 units/243mL sodium chloride 0.45%) (1,300 Units/hr Intravenous Transfusing/Transfer 11/04/16 2046)  atorvastatin (LIPITOR) tablet 40 mg (not administered)  aspirin EC tablet 81 mg (not administered)  insulin glargine (LANTUS) injection 30 Units (not administered)  famotidine (PEPCID) tablet 20 mg (not administered)  metoCLOPramide (REGLAN) tablet 5 mg (not administered)  acetaminophen (TYLENOL) tablet 650 mg (not administered)    Or  acetaminophen (TYLENOL) suppository 650 mg (not administered)   insulin aspart (novoLOG) injection 0-15 Units (not administered)  insulin aspart (novoLOG) injection 0-5 Units (not administered)  insulin aspart (novoLOG) injection 4 Units (not administered)  sodium chloride flush (NS) 0.9 % injection 3 mL (not administered)  oxyCODONE (Oxy IR/ROXICODONE) immediate release tablet 5 mg (not administered)  polyethylene glycol (MIRALAX / GLYCOLAX) packet 17 g (not administered)  ondansetron (ZOFRAN) tablet 4 mg (not administered)    Or  ondansetron (ZOFRAN) injection 4 mg (not administered)  sodium chloride 0.9 % bolus 1,000 mL (0 mLs Intravenous Stopped 11/04/16 2059)  iopamidol (ISOVUE-370) 76 % injection 100 mL (100 mLs Intravenous Contrast Given 11/04/16 1847)  heparin bolus via infusion 4,000 Units (4,000 Units Intravenous Bolus  from Bag 11/04/16 2018)     Initial Impression / Assessment and Plan / ED Course  I have reviewed the triage vital signs and the nursing notes.  Pertinent labs & imaging results that were available during my care of the patient were reviewed by me and considered in my medical decision making (see chart for details).  Clinical Course     Patient with saddle embolus of unknown origin. No hypotension. Mild hypoxia but better with 2 L nasal cannula. No respiratory distress and no altered mental status. Patient started on heparin. Also has evidence of right heart strain on her CT scan so I spoke with medicine about admission and they recommended I speak with ICU to make sure she didn't need ICU care and spoke with Dr. Rolla Etienne and he felt the patient was okay for stepdown admission. He stated that someone was to see the patient when she arrived there and help with management.  Final Clinical Impressions(s) / ED Diagnoses   Final diagnoses:  Acute saddle pulmonary embolism without acute cor pulmonale [facility])    New Prescriptions Current Discharge Medication List       Merrily Pew, MD 11/04/16 2222

## 2016-11-04 NOTE — H&P (Signed)
History and Physical  Natalie Castro [id number] DOB: 1941-07-22 DOA: 11/04/2016  Referring physician: Dr Dayna Barker, ED physician PCP: [name], MD  Outpatient Specialists: None  Chief Complaint: Shortness of breath, lightheadedness, chest pain  HPI: Natalie Castro is a 75 y.o. female with a history of essential hypertension, type 2 diabetes, hyperlipidemia, complete heart block. Patient has been having a couple weeks of not feeling well with increased dyspnea on exertion and mild chest pain or the past couple of weeks. This has been worsening is acutely worse today. Symptoms are worse with activity and improved with rest. Patient came to emergency department for evaluation.  Emergency department course: Patient had a d-dimer that was 15 and a CTA of the chest which showed subtle embolus with right heart strain. Patient has not been hypotensive and has been on room air. PCN was consulted, who determined the patient could be admitted to stepdown, although they would evaluate the patient upon arrival at [facility] to see if condition is changed and whether the patient needed intervention.   Review of Systems:   Pt denies any fevers, chills, nausea, vomiting, diarrhea, constipation, abdominal pain, orthopnea, cough, wheezing, palpitations, headache, vision changes, lightheadedness, dizziness, melena, rectal bleeding.  Review of systems are otherwise negative  Past Medical History:  Diagnosis Date  . Acid reflux   . Arthritis   . Complete heart block (HCC)    STJ PPM Dr. [name] 11/24/15  . Essential hypertension   . Hyperlipidemia   . Type 2 diabetes mellitus ([address])    Past Surgical History:  Procedure Laterality Date  . EP IMPLANTABLE DEVICE N/A 11/24/2015   Procedure: Pacemaker Implant;  Surgeon: [name], MD;  Location: [place] [facility];  Service: Cardiovascular;  Laterality: N/A;  . JOINT REPLACEMENT     knees bilat.  . TUBAL LIGATION     Social History:  reports that  she has never smoked. She has never used smokeless tobacco. She reports that she does not drink alcohol or use drugs. Patient lives at [place]  . Penicillins Hives and Itching    Has patient had a PCN reaction causing immediate rash, facial/tongue/throat swelling, SOB or lightheadedness with hypotension: No Has patient had a PCN reaction causing severe rash involving mucus membranes or skin necrosis: Yes Has patient had a PCN reaction that required hospitalization No Has patient had a PCN reaction occurring within the last 10 years: No If all of the above answers are "NO", then may proceed with Cephalosporin use.     Family History  Problem Relation Age of Onset  . Heart failure Mother   . Hypertension Mother   . Heart failure Sister   . Hypertension Father      Prior to Admission medications   Medication Sig Start Date End Date Taking? Authorizing Provider  aspirin EC 81 MG tablet Take 81 mg by mouth every morning.   Yes Historical Provider, MD  ibuprofen (ADVIL,MOTRIN) 200 MG tablet Take 200 mg by mouth every 6 (six) hours as needed for mild pain or moderate pain.   Yes Historical Provider, MD  LANTUS SOLOSTAR 100 UNIT/ML Solostar Pen Inject 30 Units into the skin at bedtime.  11/16/15  Yes Historical Provider, MD  metFORMIN (GLUCOPHAGE) 1000 MG tablet Take 1,000 mg by mouth 2 (two) times daily with a meal.   Yes Historical Provider, MD  atorvastatin (LIPITOR) 40 MG tablet Take 40 mg by mouth at bedtime.  11/02/15   Historical  Provider, MD  hydrALAZINE (APRESOLINE) 25 MG tablet Take 25 mg by mouth 2 (two) times daily. 11/02/15   Historical Provider, MD  losartan (COZAAR) 100 MG tablet Take 100 mg by mouth every morning.  11/02/15   Historical Provider, MD  metoCLOPramide (REGLAN) 5 MG tablet Take 5 mg by mouth 4 (four) times daily.    Historical Provider, MD  NIFEdipine (PROCARDIA XL/ADALAT-CC) 90 MG 24 hr tablet Take 90 mg by mouth every morning.  11/02/15    Historical Provider, MD  ranitidine (ZANTAC) 300 MG tablet Take 300 mg by mouth every morning.  11/02/15   Historical Provider, MD    Physical Exam: BP 136/90 (BP Location: Left Arm)   Pulse 113   Temp 98.1 F (36.7 C) (Oral)   Resp 22   Ht 5\' 5"  (1.651 m)   Wt 91.2 kg (201 lb)   SpO2 100%   BMI 33.45 kg/m   General: Older black female. Awake and alert and oriented x3. No acute cardiopulmonary distress.  HEENT: Normocephalic atraumatic.  Right and left ears normal in appearance.  Pupils equal, round, reactive to light. Extraocular muscles are intact. Sclerae anicteric and noninjected.  Moist mucosal membranes. No mucosal lesions.  Neck: Neck supple without lymphadenopathy. No carotid bruits. No masses palpated.  Cardiovascular: Regular rate with normal S1-S2 sounds. No murmurs, rubs, gallops auscultated. No JVD.  Respiratory: Good respiratory effort with no wheezes, rales, rhonchi. Lungs clear to auscultation bilaterally.  No accessory muscle use. Abdomen: Soft, nontender, nondistended. Active bowel sounds. No masses or hepatosplenomegaly  Skin: No rashes, lesions, or ulcerations.  Dry, warm to touch. 2+ dorsalis pedis and radial pulses. Musculoskeletal: No calf or leg pain. All major joints not erythematous nontender.  No upper or lower joint deformation.  Good ROM.  No contractures  Psychiatric: Intact judgment and insight. Pleasant and cooperative. Neurologic: No focal neurological deficits. Strength is 5/5 and symmetric in upper and lower extremities.  Cranial nerves II through XII are grossly intact.           Labs on Admission: I have personally reviewed following labs and imaging studies  CBC:  Recent Labs Lab 11/04/16 1633  WBC 9.0  NEUTROABS 6.7  HGB 12.3  HCT 35.6*  MCV 112.7*  PLT [id number]   Basic Metabolic Panel:  Recent Labs Lab 11/04/16 1633  NA 139  K 3.7  CL 104  CO2 26  GLUCOSE 225*  BUN 13  CREATININE 0.73  CALCIUM 9.3   GFR: Estimated Creatinine  Clearance: 68.9 mL/min (by C-G formula based on SCr of 0.73 mg/dL). Liver Function Tests:  Recent Labs Lab 11/04/16 1633  AST 17  ALT 14  ALKPHOS 77  BILITOT 0.9  PROT 7.5  ALBUMIN 3.6   No results for input(s): LIPASE, AMYLASE in the last 168 hours. No results for input(s): AMMONIA in the last 168 hours. Coagulation Profile: No results for input(s): INR, PROTIME in the last 168 hours. Cardiac Enzymes:  Recent Labs Lab 11/04/16 1633  TROPONINI 0.18*   BNP (last 3 results) No results for input(s): PROBNP in the last 8760 hours. HbA1C: No results for input(s): HGBA1C in the last 72 hours. CBG: No results for input(s): GLUCAP in the last 168 hours. Lipid Profile: No results for input(s): CHOL, HDL, LDLCALC, TRIG, CHOLHDL, LDLDIRECT in the last 72 hours. Thyroid Function Tests: No results for input(s): TSH, T4TOTAL, FREET4, T3FREE, THYROIDAB in the last 72 hours. Anemia Panel: No results for input(s): VITAMINB12, FOLATE, FERRITIN, TIBC, IRON,  RETICCTPCT in the last 72 hours. Urine analysis:    Component Value Date/Time   COLORURINE YELLOW 06/18/2015 2300   APPEARANCEUR CLEAR 06/18/2015 2300   LABSPEC 1.020 06/18/2015 2300   PHURINE 6.0 06/18/2015 2300   GLUCOSEU NEGATIVE 06/18/2015 2300   HGBUR NEGATIVE 06/18/2015 2300   BILIRUBINUR NEGATIVE 06/18/2015 2300   KETONESUR TRACE (A) 06/18/2015 2300   PROTEINUR NEGATIVE 06/18/2015 2300   UROBILINOGEN 0.2 06/18/2015 2300   NITRITE NEGATIVE 06/18/2015 2300   LEUKOCYTESUR SMALL (A) 06/18/2015 2300   Sepsis Labs: @LABRCNTIP (procalcitonin:4,lacticidven:4) )No results found for this or any previous visit (from the past 240 hour(s)).   Radiological Exams on Admission: Ct Angio Chest Pe W And/or Wo Contrast  Result Date: 11/04/2016 CLINICAL DATA:  Weakness and dyspnea since this morning. EXAM: CT ANGIOGRAPHY CHEST WITH CONTRAST TECHNIQUE: Multidetector CT imaging of the chest was performed using the standard protocol during  bolus administration of intravenous contrast. Multiplanar CT image reconstructions and MIPs were obtained to evaluate the vascular anatomy. CONTRAST:  100 cc IV Isovue 370 COMPARISON:  Same day chest radiograph CT abdomen from 06/19/2015 FINDINGS: Cardiovascular: There is a large saddle embolus with extension into the right and left lobar and right proximal segmental pulmonary arteries. RV/ LV ratio 1.62 consistent with right heart strain. The heart is enlarged. Aortic atherosclerosis without dissection. No pericardial effusion. Mediastinum/Nodes: Unremarkable esophagus and trachea. Substernal goiter. More No adenopathy. Lungs/Pleura: No pneumothorax. Minimal right and left lower lobe atelectasis. No pneumonic consolidation or dominant mass. Upper Abdomen: Stable pair of right hepatic lobe cysts the largest measuring 2.6 cm have remained stable. The gallbladder is physiologically distended. The visualized spleen and pancreas as well as adrenal glands are without acute appearing abnormalities. Musculoskeletal: No chest wall abnormality. No acute osseous abnormality. Multilevel mild degenerative disc disease of the visualized dorsal spine. Review of the MIP images confirms the above findings. IMPRESSION: Positive for acute PE with CT evidence of right heart strain (RV/LV Ratio = 1.6) consistent with at least submassive (intermediate risk) PE. The presence of right heart strain has been associated with an increased risk of morbidity and mortality. Saddle pulmonary embolus with extension into bilateral lower lobar and proximal right lower segmental pulmonary arteries. Critical Value/emergent results were called by telephone at the time of interpretation on 11/04/2016 at 7:29 pm to Dr. Merrily Pew , who verbally acknowledged these results. Electronically Signed   By: Ashley Royalty M.D.   On: 11/04/2016 19:30   Dg Chest Portable 1 View  Result Date: 11/04/2016 CLINICAL DATA:  Tachycardia and shortness of breath EXAM:  PORTABLE CHEST 1 VIEW COMPARISON:  November 25, 2015 FINDINGS: Mild cardiomegaly. Stable pacemaker. No pneumothorax. No pulmonary nodules, masses, or focal infiltrates. IMPRESSION: No active disease. Electronically Signed   By: Dorise Bullion III M.D   On: 11/04/2016 17:23    EKG: Independently reviewed. Wide-complex tachycardia with a left bundle branch block. No acute ST changes.  Assessment/Plan: Principal Problem:   Saddle pulmonary embolus (HCC) Active Problems:   Diabetes type 2, controlled ([place])   Essential hypertension   Elevated troponin I level    This patient was discussed with the ED physician, including pertinent vitals, physical exam findings, labs, and imaging.  We also discussed care given by the ED provider.  #1 acute saddle pulmonary embolus  Admit to stepdown at [facility]  Continue heparin drip per pharmacy consult  Check CBC in the morning  Echo cardiogram in the morning to evaluate right heart strain #2 elevated  troponin I level  Follow troponins #3 essential hypertension  Hold antihypertensives as the patient is mildly hypotensive #4 diabetes type 2  Hold metformin secondary to IV contrast  Signed scale insulin with pre-meal insulin line CBG before meals and daily at bedtime  Continue long-acting insulin  DVT prophylaxis: On heparin drip Consultants: Pharmacy Code Status: Full code Family Communication: Multiple daughters in the room  Disposition Plan: Patient should be able to return home following admission   Truett Mainland, DO [facility] Pager [phone]  If 7PM-7AM, please contact night-coverage [email or web address] Password [email or web address]

## 2016-11-04 NOTE — ED Triage Notes (Signed)
Pt reports weakness and SOB that started this am when she awakened. Pt able to speak in complete sentences and RR is normal. Pt states she has a pacemaker that was placed last year.

## 2016-11-05 ENCOUNTER — Inpatient Hospital Stay (HOSPITAL_COMMUNITY): Payer: Medicare Other

## 2016-11-05 DIAGNOSIS — I10 Essential (primary) hypertension: Secondary | ICD-10-CM

## 2016-11-05 DIAGNOSIS — R748 Abnormal levels of other serum enzymes: Secondary | ICD-10-CM

## 2016-11-05 DIAGNOSIS — I2692 Saddle embolus of pulmonary artery without acute cor pulmonale: Principal | ICD-10-CM

## 2016-11-05 DIAGNOSIS — R609 Edema, unspecified: Secondary | ICD-10-CM

## 2016-11-05 DIAGNOSIS — E119 Type 2 diabetes mellitus without complications: Secondary | ICD-10-CM

## 2016-11-05 DIAGNOSIS — Z794 Long term (current) use of insulin: Secondary | ICD-10-CM

## 2016-11-05 DIAGNOSIS — I2699 Other pulmonary embolism without acute cor pulmonale: Secondary | ICD-10-CM

## 2016-11-05 LAB — CBC
HEMATOCRIT: 29 % — AB (ref 36.0–46.0)
HEMOGLOBIN: 9.7 g/dL — AB (ref 12.0–15.0)
MCH: 37.3 pg — ABNORMAL HIGH (ref 26.0–34.0)
MCHC: 33.4 g/dL (ref 30.0–36.0)
MCV: 111.5 fL — ABNORMAL HIGH (ref 78.0–100.0)
Platelets: 175 10*3/uL (ref 150–400)
RBC: 2.6 MIL/uL — ABNORMAL LOW (ref 3.87–5.11)
RDW: 14 % (ref 11.5–15.5)
WBC: 8.4 10*3/uL (ref 4.0–10.5)

## 2016-11-05 LAB — TROPONIN I
TROPONIN I: 0.47 ng/mL — AB (ref <0.03)
TROPONIN I: 0.26 ng/mL — AB (ref <0.03)
Troponin I: 0.5 ng/mL (ref <0.03)

## 2016-11-05 LAB — GLUCOSE, CAPILLARY
GLUCOSE-CAPILLARY: 205 mg/dL — AB (ref 65–99)
GLUCOSE-CAPILLARY: 168 mg/dL — AB (ref 65–99)
Glucose-Capillary: 137 mg/dL — ABNORMAL HIGH (ref 65–99)
Glucose-Capillary: 135 mg/dL — ABNORMAL HIGH (ref 65–99)

## 2016-11-05 LAB — MRSA PCR SCREENING: MRSA BY PCR: NEGATIVE

## 2016-11-05 LAB — HEPARIN LEVEL (UNFRACTIONATED)
HEPARIN UNFRACTIONATED: 0.65 [IU]/mL (ref 0.30–0.70)
Heparin Unfractionated: 0.63 IU/mL (ref 0.30–0.70)

## 2016-11-05 NOTE — Progress Notes (Signed)
ANTICOAGULATION CONSULT NOTE - Follow Up Consult  Pharmacy Consult for heparin Indication: pulmonary embolus  Patient Measurements: Height: 5\' 5"  (165.1 cm) Weight: 201 lb (91.2 kg) IBW/kg (Calculated) : 57 Heparin Dosing Weight: 77 kg  Vital Signs: Temp: 98.2 F (36.8 C) (11/11 1200) Temp Source: Oral (11/11 1200) BP: 124/69 (11/11 1200) Pulse Rate: 94 (11/11 1200)  Labs:  Recent Labs  11/04/16 1633 11/04/16 2302 11/05/16 0327 11/05/16 0929 11/05/16 1523  HGB 12.3  --  9.7*  --   --   HCT 35.6*  --  29.0*  --   --   PLT 184  --  175  --   --   HEPARINUNFRC  --   --   --  0.65 0.63  CREATININE 0.73  --   --   --   --   TROPONINI 0.18* 0.50* 0.47* 0.26*  --    Estimated Creatinine Clearance: 68.9 mL/min (by C-G formula based on SCr of 0.73 mg/dL).  Medications:  Prescriptions Prior to Admission  Medication Sig Dispense Refill Last Dose  . aspirin EC 81 MG tablet Take 81 mg by mouth every morning.   11/04/2016 at Unknown time  . atorvastatin (LIPITOR) 40 MG tablet Take 40 mg by mouth at bedtime.    11/04/2016 at Unknown time  . hydrALAZINE (APRESOLINE) 25 MG tablet Take 25 mg by mouth 2 (two) times daily.   11/04/2016 at Unknown time  . ibuprofen (ADVIL,MOTRIN) 200 MG tablet Take 200 mg by mouth every 6 (six) hours as needed for mild pain or moderate pain.   Past Week at Unknown time  . LANTUS SOLOSTAR 100 UNIT/ML Solostar Pen Inject 30 Units into the skin at bedtime.    11/04/2016 at Unknown time  . losartan (COZAAR) 100 MG tablet Take 100 mg by mouth every morning.    11/04/2016 at Unknown time  . metFORMIN (GLUCOPHAGE) 1000 MG tablet Take 1,000 mg by mouth 2 (two) times daily with a meal.   11/04/2016 at Unknown time  . NIFEdipine (PROCARDIA XL/ADALAT-CC) 90 MG 24 hr tablet Take 90 mg by mouth every morning.    11/04/2016 at Unknown time  . ranitidine (ZANTAC) 300 MG tablet Take 300 mg by mouth every morning.    11/04/2016 at Unknown time    Assessment: 76 yo lady  to start heparin for submassive PE.  CT shows evidence of right heart strain.  Confirmatory heparin level at goal (0.6). No bleeding issues noted. Will continue at current rate and check labs in am.  Goal of Therapy:  Heparin level 0.3-0.7 units/ml Monitor platelets by anticoagulation protocol: Yes  Plan:  Continue heparin gtt at 1300 units/hr Daily HL and CBC Monitor for bleeding complications  Erin Hearing PharmD., BCPS Clinical Pharmacist Pager [phone] 11/05/2016 4:22 PM

## 2016-11-05 NOTE — Progress Notes (Signed)
[place] TEAM 1 - Stepdown/ICU TEAM  SUHEILY KAUTZ  [id number] DOB: [birth year] DOA: 11/04/2016 PCP: [name], MD    Brief Narrative:  75 y.o. female with a history of essential hypertension, type 2 diabetes, hyperlipidemia, complete heart block who reported dyspnea on exertion and mild chest pain for a couple of weeks.  In the ED CTA of the chest showed saddle embolus with right heart strain.   Subjective: The patient is currently resting comfortably in bed.  She denies chest pain shortness breath fevers chills nausea or vomiting.  She does report some intermittent crampy-type pains in both legs.  She is alert and quite pleasant.  Assessment & Plan:  acute saddle pulmonary embolus heparin drip per pharmacy consult x48hrs - LE venous duplex pending - TTE pending   elevated troponin I  Due to above - follow trend   essential hypertension Blood pressures currently well-controlled  diabetes type 2 Follow trend without change in treatment plan today  CHB s/p pacer   DVT prophylaxis: IV heparin  Code Status: FULL CODE Family Communication: Spoke with patient and multiple siblings at bedside Disposition Plan: SDU  Consultants:  PCCM  Procedures: TTE - pending  Venous duplex - pending  Antimicrobials:  none  Objective: Blood pressure 124/69, pulse 94, temperature 98.2 F (36.8 C), temperature source Oral, resp. rate (!) 24, height 5\' 5"  (1.651 m), weight 91.2 kg (201 lb), SpO2 99 %.  Intake/Output Summary (Last 24 hours) at 11/05/16 1426 Last data filed at 11/05/16 1200  Gross per 24 hour  Intake           635.57 ml  Output              750 ml  Net          -114.43 ml   Filed Weights   11/04/16 1541  Weight: 91.2 kg (201 lb)    Examination: General: No acute respiratory distress Lungs: Clear to auscultation bilaterally without wheezes or crackles Cardiovascular: Regular rate and rhythm without murmur gallop or rub normal S1 and S2 Abdomen:  Nontender, nondistended, soft, bowel sounds positive, no rebound, no ascites, no appreciable mass Extremities: No significant cyanosis, clubbing, or edema bilateral lower extremities  CBC:  Recent Labs Lab 11/04/16 1633 11/05/16 0327  WBC 9.0 8.4  NEUTROABS 6.7  --   HGB 12.3 9.7*  HCT 35.6* 29.0*  MCV 112.7* 111.5*  PLT 184 0000000   Basic Metabolic Panel:  Recent Labs Lab 11/04/16 1633  NA 139  K 3.7  CL 104  CO2 26  GLUCOSE 225*  BUN 13  CREATININE 0.73  CALCIUM 9.3   GFR: Estimated Creatinine Clearance: 68.9 mL/min (by C-G formula based on SCr of 0.73 mg/dL).  Liver Function Tests:  Recent Labs Lab 11/04/16 1633  AST 17  ALT 14  ALKPHOS 77  BILITOT 0.9  PROT 7.5  ALBUMIN 3.6   Cardiac Enzymes:  Recent Labs Lab 11/04/16 1633 11/04/16 2302 11/05/16 0327 11/05/16 0929  TROPONINI 0.18* 0.50* 0.47* 0.26*    HbA1C: Hgb A1c MFr Bld  Date/Time Value Ref Range Status  11/23/2015 03:59 PM 7.6 (H) 4.8 - 5.6 % Final    Comment:    (NOTE)         Pre-diabetes: 5.7 - 6.4         Diabetes: >6.4         Glycemic control for adults with diabetes: <7.0     CBG:  Recent Labs Lab 11/04/16 2229 11/05/16  [id number] 11/05/16 1311  GLUCAP 198* 205* 168*    Recent Results (from the past 240 hour(s))  MRSA PCR Screening     Status: None   Collection Time: 11/05/16 12:08 AM  Result Value Ref Range Status   MRSA by PCR NEGATIVE NEGATIVE Final    Comment:        The GeneXpert MRSA Assay (FDA approved for NASAL specimens only), is one component of a comprehensive MRSA colonization surveillance program. It is not intended to diagnose MRSA infection nor to guide or monitor treatment for MRSA infections.      Scheduled Meds: . aspirin EC  81 mg Oral q morning - 10a  . atorvastatin  40 mg Oral QHS  . famotidine  20 mg Oral QHS  . insulin aspart  0-15 Units Subcutaneous TID WC  . insulin aspart  0-5 Units Subcutaneous QHS  . insulin aspart  4 Units  Subcutaneous TID WC  . insulin glargine  30 Units Subcutaneous QHS  . metoCLOPramide  5 mg Oral TID AC & HS  . sodium chloride flush  3 mL Intravenous Q12H   Continuous Infusions: . heparin 1,300 Units/hr (11/05/16 1231)     LOS: 1 day   Cherene Altes, MD [facility] Office  [phone] Pager - Text Page per Shea Evans as per below:  On-Call/Text Page:      [email or web address]      password [email or web address]  If 7PM-7AM, please contact night-coverage [email or web address] Password [email or web address] 11/05/2016, 2:26 PM

## 2016-11-05 NOTE — Consult Note (Signed)
Name: Natalie Castro MRN: [id number] DOB: 13-Jun-1941    ADMISSION DATE:  11/04/2016 CONSULTATION DATE:  11/05/16  REFERRING MD :  Dr. Thereasa Solo   CHIEF COMPLAINT:  SOB, Chest Pain - Pulmonary Embolism   HISTORY OF PRESENT ILLNESS:  75 y/o F, never smoker, 2nd hand exposure through husband with PMH of GERD, DM II, arthritis, HTN, HLD, and complete heart block s/p pacemaker who presented to [facility] on 11/10 with complaints of dyspnea and chest discomfort.    ER evaluation found her to be hemodynamically stable.  D-Dimer assessed and 12.84 which lead to a CTA of the chest which was positive for acute saddle pulmonary embolism with extension into bilateral lobar and proximal RL segment pulmonary arteries.  RV/LV ratio of 1.6.  Initial troponin 0.18.  The patient was started on IV heparin per pharmacy.  She was admitted per TRH.  Troponin rose to a peak of 0.50.  The patient had minimal oxygen requirement, 98-100% on 2 L.  ECHO is pending as well as LE venous duplex.    The patient reports she is mostly sedentary. She predominantly sits in her chair at home. Her family prepares her meals. She is able to bathe herself independently. She notes that she has been short of breath and has been less active since her pacemaker placement in 2016. Occasionally she and her husband will go out for a drive but no other physical activity.  She reports 2 weeks prior to presentation she began developing increasing shortness of breath with exertion. She also noted central chest tightness/discomfort. No reported syncope/presyncope.  She has noted lower extremity swelling, left greater than right. She reports she is followed by Dr. Legrand Rams in [place] and is up-to-date on colonoscopy/cancer screenings.  She has lost approximately 30 lbs in the last year with decreased appetite.   PCCM consulted for evaluation of PE.    PAST MEDICAL HISTORY :   has a past medical history of Acid reflux; Arthritis; Complete heart block ([place]);  Essential hypertension; Hyperlipidemia; and Type 2 diabetes mellitus ([place]).   has a past surgical history that includes Tubal ligation; Joint replacement; and Cardiac catheterization (N/A, 11/24/2015).  Prior to Admission medications   Medication Sig Start Date End Date Taking? Authorizing Provider  aspirin EC 81 MG tablet Take 81 mg by mouth every morning.   Yes Historical Provider, MD  ibuprofen (ADVIL,MOTRIN) 200 MG tablet Take 200 mg by mouth every 6 (six) hours as needed for mild pain or moderate pain.   Yes Historical Provider, MD  LANTUS SOLOSTAR 100 UNIT/ML Solostar Pen Inject 30 Units into the skin at bedtime.  11/16/15  Yes Historical Provider, MD  metFORMIN (GLUCOPHAGE) 1000 MG tablet Take 1,000 mg by mouth 2 (two) times daily with a meal.   Yes Historical Provider, MD  atorvastatin (LIPITOR) 40 MG tablet Take 40 mg by mouth at bedtime.  11/02/15   Historical Provider, MD  hydrALAZINE (APRESOLINE) 25 MG tablet Take 25 mg by mouth 2 (two) times daily. 11/02/15   Historical Provider, MD  losartan (COZAAR) 100 MG tablet Take 100 mg by mouth every morning.  11/02/15   Historical Provider, MD  metoCLOPramide (REGLAN) 5 MG tablet Take 5 mg by mouth 4 (four) times daily.    Historical Provider, MD  NIFEdipine (PROCARDIA XL/ADALAT-CC) 90 MG 24 hr tablet Take 90 mg by mouth every morning.  11/02/15   Historical Provider, MD  ranitidine (ZANTAC) 300 MG tablet Take 300 mg by mouth every morning.  11/02/15   Historical Provider, MD    Allergies  Allergen Reactions  . Penicillins Hives and Itching    Has patient had a PCN reaction causing immediate rash, facial/tongue/throat swelling, SOB or lightheadedness with hypotension: No Has patient had a PCN reaction causing severe rash involving mucus membranes or skin necrosis: Yes Has patient had a PCN reaction that required hospitalization No Has patient had a PCN reaction occurring within the last 10 years: No If all of the above answers are "NO", then  may proceed with Cephalosporin use.     FAMILY HISTORY:  family history includes Heart failure in her mother and sister; Hypertension in her father and mother.  SOCIAL HISTORY:  reports that she has never smoked. She has never used smokeless tobacco. She reports that she does not drink alcohol or use drugs.  REVIEW OF SYSTEMS:  POSITIVES IN BOLD Constitutional: Negative for fever, chills, weight loss, malaise/fatigue and diaphoresis.  HENT: Negative for hearing loss, ear pain, nosebleeds, congestion, sore throat, neck pain, tinnitus and ear discharge.   Eyes: Negative for blurred vision, double vision, photophobia, pain, discharge and redness.  Respiratory: Negative for cough, hemoptysis, sputum production, shortness of breath, wheezing and stridor.   Cardiovascular: Negative for chest pain, palpitations, orthopnea, claudication, leg swelling and PND.  Gastrointestinal: Negative for heartburn, nausea, vomiting, abdominal pain, diarrhea, constipation, blood in stool and melena.  Genitourinary: Negative for dysuria, urgency, frequency, hematuria and flank pain.  Musculoskeletal: Negative for myalgias, back pain, joint pain and falls.  Skin: Negative for itching and rash.  Neurological: Negative for dizziness, tingling, tremors, sensory change, speech change, focal weakness, seizures, loss of consciousness, weakness and headaches.  Endo/Heme/Allergies: Negative for environmental allergies and polydipsia. Does not bruise/bleed easily.  SUBJECTIVE:   VITAL SIGNS: Temp:  [98.1 F (36.7 C)-98.8 F (37.1 C)] 98.3 F (36.8 C) (11/11 0800) Pulse Rate:  [91-127] 91 (11/11 0800) Resp:  [7-26] 9 (11/11 0800) BP: (91-136)/(55-90) 103/59 (11/11 0800) SpO2:  [90 %-100 %] 98 % (11/11 0800) Weight:  [201 lb (91.2 kg)] 201 lb (91.2 kg) (11/10 1541)  PHYSICAL EXAMINATION: General:  Well developed female in NAD Neuro:  AAOx4, speech clear, MAE HEENT:  MM pink/moist, no jvd, no LAN Cardiovascular:   s1s2 rrr, no m/r/g Lungs:  Even/non-labored, diminished breath sounds  Abdomen:  Obese/soft, bsx4 active, non-tender  Musculoskeletal:  No acute deformities  Skin:  Warm/dry, LLE larger than RLE   Recent Labs Lab 11/04/16 1633  NA 139  K 3.7  CL 104  CO2 26  BUN 13  CREATININE 0.73  GLUCOSE 225*     Recent Labs Lab 11/04/16 1633 11/05/16 0327  HGB 12.3 9.7*  HCT 35.6* 29.0*  WBC 9.0 8.4  PLT 184 175    Ct Angio Chest Pe W And/or Wo Contrast  Result Date: 11/04/2016 CLINICAL DATA:  Weakness and dyspnea since this morning. EXAM: CT ANGIOGRAPHY CHEST WITH CONTRAST TECHNIQUE: Multidetector CT imaging of the chest was performed using the standard protocol during bolus administration of intravenous contrast. Multiplanar CT image reconstructions and MIPs were obtained to evaluate the vascular anatomy. CONTRAST:  100 cc IV Isovue 370 COMPARISON:  Same day chest radiograph CT abdomen from 06/19/2015 FINDINGS: Cardiovascular: There is a large saddle embolus with extension into the right and left lobar and right proximal segmental pulmonary arteries. RV/ LV ratio 1.62 consistent with right heart strain. The heart is enlarged. Aortic atherosclerosis without dissection. No pericardial effusion. Mediastinum/Nodes: Unremarkable esophagus and trachea. Substernal goiter. More  No adenopathy. Lungs/Pleura: No pneumothorax. Minimal right and left lower lobe atelectasis. No pneumonic consolidation or dominant mass. Upper Abdomen: Stable pair of right hepatic lobe cysts the largest measuring 2.6 cm have remained stable. The gallbladder is physiologically distended. The visualized spleen and pancreas as well as adrenal glands are without acute appearing abnormalities. Musculoskeletal: No chest wall abnormality. No acute osseous abnormality. Multilevel mild degenerative disc disease of the visualized dorsal spine. Review of the MIP images confirms the above findings. IMPRESSION: Positive for acute PE with  CT evidence of right heart strain (RV/LV Ratio = 1.6) consistent with at least submassive (intermediate risk) PE. The presence of right heart strain has been associated with an increased risk of morbidity and mortality. Saddle pulmonary embolus with extension into bilateral lower lobar and proximal right lower segmental pulmonary arteries. Critical Value/emergent results were called by telephone at the time of interpretation on 11/04/2016 at 7:29 pm to Dr. Merrily Pew , who verbally acknowledged these results. Electronically Signed   By: Ashley Royalty M.D.   On: 11/04/2016 19:30   Dg Chest Portable 1 View  Result Date: 11/04/2016 CLINICAL DATA:  Tachycardia and shortness of breath EXAM: PORTABLE CHEST 1 VIEW COMPARISON:  November 25, 2015 FINDINGS: Mild cardiomegaly. Stable pacemaker. No pneumothorax. No pulmonary nodules, masses, or focal infiltrates. IMPRESSION: No active disease. Electronically Signed   By: Dorise Bullion III M.D   On: 11/04/2016 17:23      SIGNIFICANT EVENTS  11/10  Admit with DOE, chest discomfort for ~2 weeks, work up positive for PE  STUDIES:  CTA Chest 11/10 >> acute PE with CT evidence of RV strain, RV/LV ratio 1.6, saddle PE with extension into bilateral lobar and proximal RL segment pulmonary arteries ECHO 11/11 >>   ASSESSMENT / PLAN:  Saddle Pulmonary Embolism - suspect provoked embolism with sedentary lifestyle. However, she has noted decreased appetite and weight loss which is concerning / raises question of underlying malignancy.  She reports she is up to date on screenings.  No masses or lesions identified on CTA chest with partial imagine of upper abdomen. Currently, she is hemodynamically stable and does not warrant thrombolytics.  Plan: Continue heparin gtt per pharmacy  Consider transition to Bre[place] / eliquis in am pending insurance review of coverage Will ask CM to review coverage for above  O2 to support sats >92% Pulmonary hygiene  Would not  mobilize until heparin level therapeutic Assess LE venous duplex to r/o mobile clot Await ECHO  Trend troponin  Intermittent CXR   Noe Gens, NP-C [facility] Pgr: [phone] or if no answer [id number] 11/05/2016, 11:15 AM      Noe Gens, NP-C [facility] Pgr: [phone] or if no answer [phone] 11/05/2016, 10:08 AM

## 2016-11-05 NOTE — Progress Notes (Signed)
ANTICOAGULATION CONSULT NOTE - Follow Up Consult  Pharmacy Consult for heparin Indication: pulmonary embolus  Patient Measurements: Height: 5\' 5"  (165.1 cm) Weight: 201 lb (91.2 kg) IBW/kg (Calculated) : 57 Heparin Dosing Weight: 77 kg  Vital Signs: Temp: 98.3 F (36.8 C) (11/11 0800) Temp Source: Oral (11/11 0800) BP: 103/59 (11/11 0800) Pulse Rate: 91 (11/11 0800)  Labs:  Recent Labs  11/04/16 1633 11/04/16 2302 11/05/16 0327 11/05/16 0929  HGB 12.3  --  9.7*  --   HCT 35.6*  --  29.0*  --   PLT 184  --  175  --   HEPARINUNFRC  --   --   --  0.65  CREATININE 0.73  --   --   --   TROPONINI 0.18* 0.50* 0.47* 0.26*   Estimated Creatinine Clearance: 68.9 mL/min (by C-G formula based on SCr of 0.73 mg/dL).  Medications:  Prescriptions Prior to Admission  Medication Sig Dispense Refill Last Dose  . aspirin EC 81 MG tablet Take 81 mg by mouth every morning.   Past Week at Unknown time  . ibuprofen (ADVIL,MOTRIN) 200 MG tablet Take 200 mg by mouth every 6 (six) hours as needed for mild pain or moderate pain.   Past Week at Unknown time  . LANTUS SOLOSTAR 100 UNIT/ML Solostar Pen Inject 30 Units into the skin at bedtime.    11/03/2016 at Unknown time  . metFORMIN (GLUCOPHAGE) 1000 MG tablet Take 1,000 mg by mouth 2 (two) times daily with a meal.   11/04/2016 at Unknown time  . atorvastatin (LIPITOR) 40 MG tablet Take 40 mg by mouth at bedtime.    Taking  . hydrALAZINE (APRESOLINE) 25 MG tablet Take 25 mg by mouth 2 (two) times daily.   Taking  . losartan (COZAAR) 100 MG tablet Take 100 mg by mouth every morning.    Taking  . metoCLOPramide (REGLAN) 5 MG tablet Take 5 mg by mouth 4 (four) times daily.   Taking  . NIFEdipine (PROCARDIA XL/ADALAT-CC) 90 MG 24 hr tablet Take 90 mg by mouth every morning.    Taking  . ranitidine (ZANTAC) 300 MG tablet Take 300 mg by mouth every morning.    Taking    Assessment: 75 yo lady to start heparin for submassive PE.  CT shows evidence of  right heart strain.  Goal of Therapy:  Heparin level 0.3-0.7 units/ml Monitor platelets by anticoagulation protocol: Yes  Plan:  Continue heparin gtt at 1300 units/hr Confirmatory heparin level at 1600 Daily HL and CBC Monitor for bleeding complications  Nicole Kindred L Deagen Krass 11/05/2016,12:39 PM

## 2016-11-05 NOTE — Progress Notes (Signed)
MD made aware via text page of lower extremity dopplers, positive for multiple DVT. Heparin level therapeutic at this time per pharmacy. No new orders received at this time, will continue to monitor patient closely.

## 2016-11-05 NOTE — Progress Notes (Signed)
Vascular tech advised Echo & Doppler studies are unable to be completed 11/05/16 due to low staffing. Per MD Thereasa Solo, ok to wait for studies until morning of Sunday 11/06/16.

## 2016-11-05 NOTE — Progress Notes (Signed)
VASCULAR LAB PRELIMINARY  PRELIMINARY  PRELIMINARY  PRELIMINARY  Bilateral lower extremity venous duplex completed.    Preliminary report:  There is acute, occluisve DVT noted in the left posterior tibial, peroneal, and popliteal veins.  There is acute, non occlusive DVT noted in the left mid to distal femoral veins.    Called report to [place], Therapist, sports.  Shermika Balthaser, RVT 11/05/2016, 6:35 PM

## 2016-11-05 NOTE — Progress Notes (Signed)
CRITICAL VALUE ALERT  Critical value received: Troponin 0.50  Date of notification: 11/05/2016   Time of notification: 0005  Critical value read back: YES  Nurse who received alert: Eulis Foster RN  MD notified (1st page): NP Jonette Eva  Time of first page: 0009  MD notified (2nd page):  Time of second page:  Responding MD:  NP Jonette Eva  Time MD responded:  [id number]

## 2016-11-06 ENCOUNTER — Inpatient Hospital Stay (HOSPITAL_COMMUNITY): Payer: Medicare Other

## 2016-11-06 DIAGNOSIS — I2699 Other pulmonary embolism without acute cor pulmonale: Secondary | ICD-10-CM | POA: Diagnosis not present

## 2016-11-06 LAB — GLUCOSE, CAPILLARY
GLUCOSE-CAPILLARY: 119 mg/dL — AB (ref 65–99)
GLUCOSE-CAPILLARY: 133 mg/dL — AB (ref 65–99)
GLUCOSE-CAPILLARY: 195 mg/dL — AB (ref 65–99)
Glucose-Capillary: 122 mg/dL — ABNORMAL HIGH (ref 65–99)
Glucose-Capillary: 175 mg/dL — ABNORMAL HIGH (ref 65–99)

## 2016-11-06 LAB — TROPONIN I: TROPONIN I: 0.19 ng/mL — AB (ref <0.03)

## 2016-11-06 LAB — CBC
HEMATOCRIT: 29.1 % — AB (ref 36.0–46.0)
HEMOGLOBIN: 9.8 g/dL — AB (ref 12.0–15.0)
MCH: 37.8 pg — AB (ref 26.0–34.0)
MCHC: 33.7 g/dL (ref 30.0–36.0)
MCV: 112.4 fL — ABNORMAL HIGH (ref 78.0–100.0)
Platelets: 156 10*3/uL (ref 150–400)
RBC: 2.59 MIL/uL — AB (ref 3.87–5.11)
RDW: 14.1 % (ref 11.5–15.5)
WBC: 8.2 10*3/uL (ref 4.0–10.5)

## 2016-11-06 LAB — ECHOCARDIOGRAM COMPLETE
Height: 65 in
Weight: 3216 oz

## 2016-11-06 LAB — BASIC METABOLIC PANEL
Anion gap: 5 (ref 5–15)
BUN: 13 mg/dL (ref 6–20)
CHLORIDE: 106 mmol/L (ref 101–111)
CO2: 28 mmol/L (ref 22–32)
Calcium: 8.7 mg/dL — ABNORMAL LOW (ref 8.9–10.3)
Creatinine, Ser: 0.92 mg/dL (ref 0.44–1.00)
GFR calc non Af Amer: 60 mL/min — ABNORMAL LOW (ref >60)
Glucose, Bld: 158 mg/dL — ABNORMAL HIGH (ref 65–99)
POTASSIUM: 3.9 mmol/L (ref 3.5–5.1)
SODIUM: 139 mmol/L (ref 135–145)

## 2016-11-06 LAB — HEPARIN LEVEL (UNFRACTIONATED): Heparin Unfractionated: 0.62 IU/mL (ref 0.30–0.70)

## 2016-11-06 MED ORDER — NIFEDIPINE ER OSMOTIC RELEASE 30 MG PO TB24
90.0000 mg | ORAL_TABLET | Freq: Every morning | ORAL | Status: DC
  Administered 2016-11-07 – 2016-11-10 (×4): 90 mg via ORAL
  Filled 2016-11-06: qty 1
  Filled 2016-11-06 (×3): qty 3

## 2016-11-06 NOTE — Plan of Care (Signed)
Problem: Consults Goal: Diagnosis - Venous Thromboembolism (VTE) Choose a selection Outcome: Completed/Met Date Met: 11/06/16 PE (Pulmonary Embolism) - confirmed by CT, DVT confirmed by venous extremity dopplers Goal: Pharmacy Consult for anticoagulation Outcome: Completed/Met Date Met: 11/06/16 Pharmacy is managing heparin IV Goal: Nutrition Consult-if indicated Outcome: Not Applicable Date Met: 11/06/16 Patient consumes 100% of each meal provided Goal: Diabetes Guidelines if Diabetic/Glucose > 140 If diabetic or lab glucose is > 140 mg/dl - Initiate Diabetes/Hyperglycemia Guidelines & Document Interventions  Outcome: Progressing Patient is receiving sliding scale insulin at each meal  Problem: Phase I Progression Outcomes Goal: Pain controlled with appropriate interventions Outcome: Progressing Patient c/o bilateral pain in knees, requested tylenol and administered. Goal: Dyspnea controlled at rest (PE) Outcome: Progressing Patient is not experiencing dyspnea while at rest however when sleeping oxygen saturations drop to 83, 2L of oxygen applied via nasal cannula while sleeping. Oxygen saturations remain 99 after cannula application. Goal: Tolerating diet Outcome: Completed/Met Date Met: 11/06/16 Patient given scheduled reglan before each meal and has no complaints of nausea or vomiting. Goal: Voiding-avoid urinary catheter unless indicated Outcome: Completed/Met Date Met: 11/06/16 Patient able to urinate sufficient quantities of urine using bedpan.   Problem: Phase II Progression Outcomes Goal: Therapeutic drug levels for anticoagulation Outcome: Progressing Patient has therapeutic heparin levels at this time with no signs of bleeding.   

## 2016-11-06 NOTE — Progress Notes (Signed)
ANTICOAGULATION CONSULT NOTE - Follow Up Consult  Pharmacy Consult for heparin Indication: pulmonary embolus  Patient Measurements: Height: 5\' 5"  (165.1 cm) Weight: 201 lb (91.2 kg) IBW/kg (Calculated) : 57 Heparin Dosing Weight: 77 kg  Vital Signs: Temp: 97.8 F (36.6 C) (11/12 0748) Temp Source: Oral (11/12 0748) BP: 155/79 (11/12 0748) Pulse Rate: 94 (11/12 0714)  Labs:  Recent Labs  11/04/16 1633  11/05/16 0327 11/05/16 0929 11/05/16 1523 11/06/16 0238  HGB 12.3  --  9.7*  --   --  9.8*  HCT 35.6*  --  29.0*  --   --  29.1*  PLT 184  --  175  --   --  156  HEPARINUNFRC  --   --   --  0.65 0.63 0.62  CREATININE 0.73  --   --   --   --  0.92  TROPONINI 0.18*  < > 0.47* 0.26*  --  0.19*  < > = values in this interval not displayed. Estimated Creatinine Clearance: 59.9 mL/min (by C-G formula based on SCr of 0.92 mg/dL).  Medications:  Prescriptions Prior to Admission  Medication Sig Dispense Refill Last Dose  . aspirin EC 81 MG tablet Take 81 mg by mouth every morning.   11/04/2016 at Unknown time  . atorvastatin (LIPITOR) 40 MG tablet Take 40 mg by mouth at bedtime.    11/04/2016 at Unknown time  . hydrALAZINE (APRESOLINE) 25 MG tablet Take 25 mg by mouth 2 (two) times daily.   11/04/2016 at Unknown time  . ibuprofen (ADVIL,MOTRIN) 200 MG tablet Take 200 mg by mouth every 6 (six) hours as needed for mild pain or moderate pain.   Past Week at Unknown time  . LANTUS SOLOSTAR 100 UNIT/ML Solostar Pen Inject 30 Units into the skin at bedtime.    11/04/2016 at Unknown time  . losartan (COZAAR) 100 MG tablet Take 100 mg by mouth every morning.    11/04/2016 at Unknown time  . metFORMIN (GLUCOPHAGE) 1000 MG tablet Take 1,000 mg by mouth 2 (two) times daily with a meal.   11/04/2016 at Unknown time  . NIFEdipine (PROCARDIA XL/ADALAT-CC) 90 MG 24 hr tablet Take 90 mg by mouth every morning.    11/04/2016 at Unknown time  . ranitidine (ZANTAC) 300 MG tablet Take 300 mg by mouth  every morning.    11/04/2016 at Unknown time   Assessment: 75 yo lady to start heparin for submassive PE. Chest CT shows evidence of right heart strain and bilateral lower extremity venous duplex showed multiple DVTs. RN reports blood around IV site, but not actively bleeding. Advised to watch closely for additional s/sx of bleeding and report any changes. Patient has been therapeutic on heparin 1300 units/hr; will continue current rate given large clot burden. HgB low but stable, Plt WNL stable.   Goal of Therapy:  Heparin level 0.3-0.7 units/ml Monitor platelets by anticoagulation protocol: Yes  Plan:  Continue heparin gtt at 1300 units/hr Daily HL and CBC Monitor for bleeding complications  Georga Bora, PharmD Clinical Pharmacist Pager: [phone] 11/06/2016 12:10 PM

## 2016-11-06 NOTE — Progress Notes (Signed)
[place] TEAM 1 - Stepdown/ICU TEAM  Natalie Castro  [id number] DOB: Mar 28, 1941 DOA: 11/04/2016 PCP: [name], MD    Brief Narrative:  75 y.o. female with a history of essential hypertension, type 2 diabetes, hyperlipidemia, complete heart block who reported dyspnea on exertion and mild chest pain for a couple of weeks.  In the ED CTA of the chest showed saddle embolus with right heart strain.   Subjective: Resting comfortably in bed.  Alert oriented and quite pleasant.  Reports only mild dyspnea at rest.  Has not yet ambulated.  Complains of arthritic type pain in left knee but no lower extremity crampy-type pains.  Assessment & Plan:  acute saddle pulmonary embolus - extensive L LE DVT heparin drip per pharmacy consult x48hrs before transitioning to oral tx - TTE pending - duplex confirms extensive DVT L LE - investigate co-pay status of NOACs - no clear inciting factor - elevated risk due to overweight and mobility limited by arthritis   elevated troponin I  due to above - appears to have peaked at 0.5  essential hypertension Blood pressure trending upward - resume home meds and stepwise fashion  diabetes type 2 CBG stable  CHB s/p pacer   DVT prophylaxis: IV heparin  Code Status: FULL CODE Family Communication: Spoke with patient and multiple daughters at bedside at length Disposition Plan: SDU  Consultants:  PCCM  Procedures: TTE - pending  Venous duplex - extensive left lower extremity acute DVT  Antimicrobials:  none  Objective: Blood pressure (!) 161/79, pulse 94, temperature (P) 99.4 F (37.4 C), temperature source (P) Oral, resp. rate (!) 23, height 5\' 5"  (1.651 m), weight 91.2 kg (201 lb), SpO2 98 %.  Intake/Output Summary (Last 24 hours) at 11/06/16 1644 Last data filed at 11/06/16 1422  Gross per 24 hour  Intake           791.72 ml  Output              825 ml  Net           -33.28 ml   Filed Weights   11/04/16 1541  Weight: 91.2 kg (201  lb)    Examination: General: No acute respiratory distress Lungs: Clear to auscultation bilaterally  Cardiovascular: Regular rate and rhythm without murmur  Abdomen: Nontender, nondistended, soft, bowel sounds positive, no rebound, no ascites, no appreciable mass Extremities: No significant cyanosis, clubbing, or edema bilateral lower extremities  CBC:  Recent Labs Lab 11/04/16 1633 11/05/16 0327 11/06/16 0238  WBC 9.0 8.4 8.2  NEUTROABS 6.7  --   --   HGB 12.3 9.7* 9.8*  HCT 35.6* 29.0* 29.1*  MCV 112.7* 111.5* 112.4*  PLT 184 175 [id number]   Basic Metabolic Panel:  Recent Labs Lab 11/04/16 1633 11/06/16 0238  NA 139 139  K 3.7 3.9  CL 104 106  CO2 26 28  GLUCOSE 225* 158*  BUN 13 13  CREATININE 0.73 0.92  CALCIUM 9.3 8.7*   GFR: Estimated Creatinine Clearance: 59.9 mL/min (by C-G formula based on SCr of 0.92 mg/dL).  Liver Function Tests:  Recent Labs Lab 11/04/16 1633  AST 17  ALT 14  ALKPHOS 77  BILITOT 0.9  PROT 7.5  ALBUMIN 3.6   Cardiac Enzymes:  Recent Labs Lab 11/04/16 1633 11/04/16 2302 11/05/16 0327 11/05/16 0929 11/06/16 0238  TROPONINI 0.18* 0.50* 0.47* 0.26* 0.19*    HbA1C: Hgb A1c MFr Bld  Date/Time Value Ref Range Status  11/23/2015 03:59 PM 7.6 (H)  4.8 - 5.6 % Final    Comment:    (NOTE)         Pre-diabetes: 5.7 - 6.4         Diabetes: >6.4         Glycemic control for adults with diabetes: <7.0     CBG:  Recent Labs Lab 11/05/16 1311 11/05/16 1708 11/05/16 2105 11/06/16 0900 11/06/16 1247  GLUCAP 168* 137* 135* 119* 122*    Recent Results (from the past 240 hour(s))  MRSA PCR Screening     Status: None   Collection Time: 11/05/16 12:08 AM  Result Value Ref Range Status   MRSA by PCR NEGATIVE NEGATIVE Final    Comment:        The GeneXpert MRSA Assay (FDA approved for NASAL specimens only), is one component of a comprehensive MRSA colonization surveillance program. It is not intended to diagnose  MRSA infection nor to guide or monitor treatment for MRSA infections.      Scheduled Meds: . aspirin EC  81 mg Oral q morning - 10a  . atorvastatin  40 mg Oral QHS  . famotidine  20 mg Oral QHS  . insulin aspart  0-15 Units Subcutaneous TID WC  . insulin aspart  0-5 Units Subcutaneous QHS  . insulin aspart  4 Units Subcutaneous TID WC  . insulin glargine  30 Units Subcutaneous QHS  . metoCLOPramide  5 mg Oral TID AC & HS  . sodium chloride flush  3 mL Intravenous Q12H   Continuous Infusions: . heparin Stopped (11/06/16 1250)     LOS: 2 days   Cherene Altes, MD [facility] Office  [phone] Pager - Text Page per Shea Evans as per below:  On-Call/Text Page:      [email or web address]      password [email or web address]  If 7PM-7AM, please contact night-coverage [email or web address] Password [facility] 11/06/2016, 4:44 PM

## 2016-11-07 LAB — CBC
HCT: 32.1 % — ABNORMAL LOW (ref 36.0–46.0)
HEMOGLOBIN: 10.9 g/dL — AB (ref 12.0–15.0)
MCH: 37.7 pg — ABNORMAL HIGH (ref 26.0–34.0)
MCHC: 34 g/dL (ref 30.0–36.0)
MCV: 111.1 fL — ABNORMAL HIGH (ref 78.0–100.0)
PLATELETS: 167 10*3/uL (ref 150–400)
RBC: 2.89 MIL/uL — AB (ref 3.87–5.11)
RDW: 14.1 % (ref 11.5–15.5)
WBC: 8.6 10*3/uL (ref 4.0–10.5)

## 2016-11-07 LAB — URINALYSIS, ROUTINE W REFLEX MICROSCOPIC
Bilirubin Urine: NEGATIVE
Glucose, UA: NEGATIVE mg/dL
Ketones, ur: NEGATIVE mg/dL
Leukocytes, UA: NEGATIVE
Nitrite: NEGATIVE
Protein, ur: NEGATIVE mg/dL
Specific Gravity, Urine: 1.005 (ref 1.005–1.030)
pH: 7.5 (ref 5.0–8.0)

## 2016-11-07 LAB — GLUCOSE, CAPILLARY
GLUCOSE-CAPILLARY: 149 mg/dL — AB (ref 65–99)
GLUCOSE-CAPILLARY: 253 mg/dL — AB (ref 65–99)
Glucose-Capillary: 212 mg/dL — ABNORMAL HIGH (ref 65–99)
Glucose-Capillary: 166 mg/dL — ABNORMAL HIGH (ref 65–99)
Glucose-Capillary: 187 mg/dL — ABNORMAL HIGH (ref 65–99)

## 2016-11-07 LAB — HEPARIN LEVEL (UNFRACTIONATED): HEPARIN UNFRACTIONATED: 0.41 [IU]/mL (ref 0.30–0.70)

## 2016-11-07 LAB — URINE MICROSCOPIC-ADD ON

## 2016-11-07 MED ORDER — INSULIN GLARGINE 100 UNIT/ML ~~LOC~~ SOLN
32.0000 [IU] | Freq: Every day | SUBCUTANEOUS | Status: DC
  Administered 2016-11-07 – 2016-11-09 (×3): 32 [IU] via SUBCUTANEOUS
  Filled 2016-11-07 (×5): qty 0.32

## 2016-11-07 MED ORDER — METFORMIN HCL 500 MG PO TABS
1000.0000 mg | ORAL_TABLET | Freq: Two times a day (BID) | ORAL | Status: DC
  Administered 2016-11-07 – 2016-11-08 (×2): 1000 mg via ORAL
  Filled 2016-11-07 (×2): qty 2

## 2016-11-07 MED ORDER — INSULIN ASPART 100 UNIT/ML ~~LOC~~ SOLN
6.0000 [IU] | Freq: Three times a day (TID) | SUBCUTANEOUS | Status: DC
  Administered 2016-11-07 – 2016-11-10 (×9): 6 [IU] via SUBCUTANEOUS

## 2016-11-07 NOTE — Progress Notes (Signed)
Attempted to transition patient from bed to chair for breakfast. Patient able to stand with Lizabeth Fellner and 2 person max assist. Patient unable to move feet to step and turn to chair. Patient stated she felt the same way as before she came in. Patient positioned sitting on EOB with daughter at bedside. VSS. Patient has PT/OT eval scheduled while inpatient. Will continue to monitor patient.

## 2016-11-07 NOTE — Evaluation (Addendum)
Occupational Therapy Evaluation Patient Details Name: Natalie Castro MRN: [id number] DOB: Dec 25, 1941 Today's Date: 11/07/2016    History of Present Illness Pt admitted with saddle pulmonary embolus. PMH: HTN, DM, HLD, complete heart block, arthritis in knees.   Clinical Impression   Pt was ambulating with a cane and assisted for IADL. She was able to perform self care independently and supervise her husband who has dementia. Pt presents with generalized weakness with L side slightly weaker than the R and reports of intermittent numbness on L side, none currently. Pt requires 2 person assist for OOB. Recommending short term rehab upon discharge, but family would like to try to take her home with home health if possible. Will follow acutely.  Follow Up Recommendations  SNF;Supervision/Assistance - 24 hour    Equipment Recommendations  3 in 1 bedside comode    Recommendations for Other Services       Precautions / Restrictions Precautions Precautions: Fall Restrictions Weight Bearing Restrictions: No      Mobility Bed Mobility Overal bed mobility: Needs Assistance Bed Mobility: Supine to Sit     Supine to sit: Supervision     General bed mobility comments: from flat bed  Transfers Overall transfer level: Needs assistance Equipment used: Rolling walker (2 wheeled) Transfers: Sit to/from [employer] Transfers Sit to Stand: +2 physical assistance;Max assist Stand pivot transfers: +2 physical assistance;Max assist       General transfer comment: assist to rise, cues for hand placement, flexed posture    Balance Overall balance assessment: Needs assistance Sitting-balance support: Feet supported Sitting balance-Leahy Scale: Fair       Standing balance-Leahy Scale: Poor                              ADL Overall ADL's : Needs assistance/impaired Eating/Feeding: Independent;Sitting   Grooming: Set up;Sitting   Upper Body Bathing: Minimal  assitance;Sitting   Lower Body Bathing: Total assistance;+2 for physical assistance;Sit to/from stand   Upper Body Dressing : Minimal assistance;Sitting   Lower Body Dressing: +2 for physical assistance;Total assistance;Sit to/from stand                       Vision     Perception     Praxis      Pertinent Vitals/Pain Pain Assessment: No/denies pain     Hand Dominance Right   Extremity/Trunk Assessment Upper Extremity Assessment Upper Extremity Assessment: Generalized weakness (4/5 R, 4-/5 L)   Lower Extremity Assessment Lower Extremity Assessment: Defer to PT evaluation       Communication Communication Communication: No difficulties   Cognition Arousal/Alertness: Awake/alert Behavior During Therapy: WFL for tasks assessed/performed Overall Cognitive Status: Within Functional Limits for tasks assessed                     General Comments       Exercises       Shoulder Instructions      Home Living Family/patient expects to be discharged to:: Private residence Living Arrangements: Spouse/significant other (spouse has dementia) Available Help at Discharge: Family;Available PRN/intermittently Type of Home: House Home Access: Stairs to enter [employer] of Steps: 3 Entrance Stairs-Rails: Right Home Layout: One level     Bathroom Shower/Tub: Teacher, early years/pre: Standard     Home Equipment: Cane - single point;Shower seat          Prior Functioning/Environment Level of  Independence: Independent with assistive device(s)        Comments: walks with cane, sponge bathes, does not drive        OT Problem List: Decreased strength;Decreased activity tolerance;Impaired balance (sitting and/or standing);Decreased knowledge of use of DME or AE;Obesity   OT Treatment/Interventions: Self-care/ADL training;DME and/or AE instruction;Therapeutic activities;Patient/family education;Balance training    OT  Goals(Current goals can be found in the care plan section) Acute Rehab OT Goals Patient Stated Goal: return home OT Goal Formulation: With patient Time For Goal Achievement: 11/21/16 Potential to Achieve Goals: Good ADL Goals Pt Will Perform Grooming: with supervision;standing Pt Will Perform Upper Body Bathing: with set-up;sitting Pt Will Perform Lower Body Bathing: with supervision;sit to/from stand Pt Will Perform Upper Body Dressing: with set-up;sitting Pt Will Perform Lower Body Dressing: with supervision;sit to/from stand Pt Will Transfer to Toilet: with supervision;ambulating;bedside commode Pt Will Perform Toileting - Clothing Manipulation and hygiene: with supervision;sit to/from stand  OT Frequency: Min 2X/week   Barriers to D/C:            Co-evaluation PT/OT/SLP Co-Evaluation/Treatment: Yes Reason for Co-Treatment: For patient/therapist safety   OT goals addressed during session: ADL's and self-care      End of Session Equipment Utilized During Treatment: Gait belt;Rolling walker Nurse Communication: Mobility status  Activity Tolerance: Patient tolerated treatment well Patient left: in chair;with call bell/phone within reach;with family/visitor present   Time: 0825-0849 OT Time Calculation (min): 23 min Charges:  OT General Charges $OT Visit: 1 Procedure OT Evaluation $OT Eval Moderate Complexity: 1 Procedure G-Codes:    Malka So 11/07/2016, 9:26 AM  [phone]

## 2016-11-07 NOTE — Progress Notes (Addendum)
Complaining of sudden polyuria and increased frequency beginning around 2100 tonight. Schorr, NP notified and urinalysis ordered. Post void residual bladder scan 38 ml, cbg 149. Will continue to monitor.

## 2016-11-07 NOTE — Evaluation (Signed)
Physical Therapy Evaluation Patient Details Name: Natalie Castro MRN: [id number] DOB: 21-Jan-1941 Today's Date: 11/07/2016   History of Present Illness  Pt admitted with saddle pulmonary embolus. PMH: HTN, DM, HLD, complete heart block, arthritis in knees.  Clinical Impression  Patient demonstrates deficits in functional mobility as indicated below. Will need continued skilled PT to address deficits and maximize function. Will see as indicated and progress as tolerated. Patient was ambulating with SPC prior to admission, currently requires +2 assist for OOB, unable to ambulate at this time. Recommend ST SNF upon acute discharge.    Follow Up Recommendations SNF;Supervision/Assistance - 24 hour    Equipment Recommendations  Rolling walker with 5" wheels    Recommendations for Other Services       Precautions / Restrictions Precautions Precautions: Fall Restrictions Weight Bearing Restrictions: No      Mobility  Bed Mobility Overal bed mobility: Needs Assistance Bed Mobility: Supine to Sit     Supine to sit: Supervision     General bed mobility comments: from flat bed  Transfers Overall transfer level: Needs assistance Equipment used: Rolling walker (2 wheeled) Transfers: Sit to/from Stand;Stand Pivot Transfers Sit to Stand: +2 physical assistance;Max assist Stand pivot transfers: +2 physical assistance;Max assist       General transfer comment: assist to rise, cues for hand placement, flexed posture  Ambulation/Gait             General Gait Details: attempted side steps to [facility], able to shuffle approx 1 foot with +2 moderate assist, max cues for upright posture and positioning, faciliatation of weight shifts required  Stairs            Wheelchair Mobility    Modified Rankin (Stroke Patients Only)       Balance Overall balance assessment: Needs assistance Sitting-balance support: Feet supported Sitting balance-Leahy Scale: Fair       Standing  balance-Leahy Scale: Poor                               Pertinent Vitals/Pain Pain Assessment: No/denies pain    Home Living Family/patient expects to be discharged to:: Private residence Living Arrangements: Spouse/significant other (spouse has dementia) Available Help at Discharge: Family;Available PRN/intermittently Type of Home: House Home Access: Stairs to enter Entrance Stairs-Rails: Right Entrance Stairs-Number of Steps: 3 Home Layout: One level Home Equipment: Cane - single point;Shower seat      Prior Function Level of Independence: Independent with assistive device(s)         Comments: walks with cane, sponge bathes, does not drive, family helps with IADL     Hand Dominance   Dominant Hand: Right    Extremity/Trunk Assessment   Upper Extremity Assessment: Generalized weakness (4/5 R, 4-/5 L)           Lower Extremity Assessment: Generalized weakness;LLE deficits/detail         Communication   Communication: No difficulties  Cognition Arousal/Alertness: Awake/alert Behavior During Therapy: WFL for tasks assessed/performed Overall Cognitive Status: Within Functional Limits for tasks assessed                      General Comments      Exercises     Assessment/Plan    PT Assessment Patient needs continued PT services  PT Problem List Decreased strength;Decreased activity tolerance;Decreased balance;Decreased mobility;Decreased cognition;Decreased safety awareness;Obesity;Pain          PT Treatment Interventions  DME instruction;Gait training;Stair training;Functional mobility training;Therapeutic activities;Therapeutic exercise;Balance training;Patient/family education    PT Goals (Current goals can be found in the Care Plan section)  Acute Rehab PT Goals Patient Stated Goal: return home PT Goal Formulation: With patient/family Time For Goal Achievement: 11/21/16 Potential to Achieve Goals: Fair    Frequency Min  2X/week   Barriers to discharge Decreased caregiver support      Co-evaluation PT/OT/SLP Co-Evaluation/Treatment: Yes Reason for Co-Treatment: For patient/therapist safety PT goals addressed during session: Mobility/safety with mobility OT goals addressed during session: ADL's and self-care       End of Session Equipment Utilized During Treatment: Gait belt Activity Tolerance: Patient limited by fatigue;Patient limited by pain Patient left: in chair;with call bell/phone within reach;with family/visitor present Nurse Communication: Mobility status         Time: 0825-0850 PT Time Calculation (min) (ACUTE ONLY): 25 min   Charges:   PT Evaluation $PT Eval Moderate Complexity: 1 Procedure     PT G CodesDuncan Dull 11-09-16, 10:58 AM Alben Deeds, PT DPT  [phone]

## 2016-11-07 NOTE — Progress Notes (Signed)
Pt received from 4E to 2w28. Tele box placed, CCMD notified x2. Pt in no pain, no SOB. Daughters at bedside. Updated on plan of care. Will continue to monitor.

## 2016-11-07 NOTE — Clinical Social Work Note (Signed)
Clinical Social Work Assessment  Patient Details  Name: Natalie Castro MRN: [id number] Date of Birth: July 08, 1941  Date of referral:  11/07/16               Reason for consult:  Facility Placement                Permission sought to share information with:  Facility Sport and exercise psychologist, Family Supports Permission granted to share information::  Yes, Verbal Permission Granted  Name::     Pharmacologist::  SNFs  Relationship::  Daughter  Contact Information:  [phone]  Housing/Transportation Living arrangements for the past 2 months:  Single Family Home Source of Information:  Patient, Adult Children, Spouse Patient Interpreter Needed:  None Criminal Activity/Legal Involvement Pertinent to Current Situation/Hospitalization:  No - Comment as needed Significant Relationships:  Adult Children, Spouse, Other Family Members Lives with:  Spouse Do you feel safe going back to the place where you live?  No Need for family participation in patient care:  Yes (Comment)  Care giving concerns:  CSW received consult for possible SNF placement at time of discharge. CSW met with patient and patient's spouse and two daughters at bedside regarding PT recommendation of SNF placement at time of discharge. Per patient's daughters, patient's spouse is currently unable to care for patient at their home given patient's current physical needs and fall risk. Patient and patient's spouse and daughters expressed understanding of PT recommendation and may be agreeable to SNF placement at time of discharge. CSW to continue to follow and assist with discharge planning needs.   Social Worker assessment / plan:  CSW spoke with patient and patient's spouse and daughters concerning possibility of rehab at [facility] before returning home.  Employment status:  Retired Nurse, adult PT Recommendations:  [place] / Referral to community resources:  [place]  Patient/Family's Response to care:  Patient and patient's spouse and daughters recognize need for rehab before returning home and may be agreeable to a SNF in [place] or [place]. Patient wants to see how she feels at discharge and to see which snfs are available before making her decision.  Patient/Family's Understanding of and Emotional Response to Diagnosis, Current Treatment, and Prognosis:  Patient/family is realistic regarding therapy needs and expressed being hopeful for SNF placement. Patient expressed understanding of CSW role and discharge process. No questions/concerns about plan or treatment.    Emotional Assessment Appearance:  Appears stated age Attitude/Demeanor/Rapport:  Other (Appropriate) Affect (typically observed):  Accepting, Appropriate Orientation:  Oriented to Self, Oriented to Situation, Oriented to Place, Oriented to  Time Alcohol / Substance use:  Not Applicable Psych involvement (Current and /or in the community):  No (Comment)  Discharge Needs  Concerns to be addressed:  Care Coordination Readmission within the last 30 days:  No Current discharge risk:  None Barriers to Discharge:  Continued Medical Work up   [employer], [place] 11/07/2016, 2:38 PM

## 2016-11-07 NOTE — Progress Notes (Signed)
ANTICOAGULATION CONSULT NOTE - Follow Up Consult  Pharmacy Consult for heparin Indication: pulmonary embolus  Patient Measurements: Height: 5\' 5"  (165.1 cm) Weight: 201 lb (91.2 kg) IBW/kg (Calculated) : 57 Heparin Dosing Weight: 77 kg  Vital Signs: Temp: 99.9 F (37.7 C) (11/13 0400) Temp Source: Oral (11/13 0400) BP: 186/95 (11/13 0400) Pulse Rate: 103 (11/13 0400)   Assessment: 74 yo lady to start heparin for submassive PE. Chest CT shows evidence of right heart strain and bilateral lower extremity venous duplex showed multiple DVTs. Last HL therapeutic at 0.41. Hgb stable at 10.9, plts wnl. No s/s of bleed.  Goal of Therapy:  Heparin level 0.3-0.7 units/ml Monitor platelets by anticoagulation protocol: Yes  Plan:  Continue heparin gtt at 1300 units/hr Monitor daily HL, CBC, s/s of bleed F/U transition to oral anticoag  Elenor Quinones, PharmD, BCPS Clinical Pharmacist Pager [phone] 11/07/2016 7:25 AM

## 2016-11-07 NOTE — Progress Notes (Signed)
[place] TEAM 1 - Stepdown/ICU TEAM  Natalie Castro  [id number] DOB: 06-06-1941 DOA: 11/04/2016 PCP: [name], MD    Brief Narrative:  75 y.o. female with a history of essential hypertension, type 2 diabetes, hyperlipidemia, complete heart block who reported dyspnea on exertion and mild chest pain for a couple of weeks.  In the ED CTA of the chest showed saddle embolus with right heart strain.   Subjective: The patient is resting comfortably in bed.  She denies chest pain shortness breath nausea vomiting fevers or chills.  She cannot with physical therapy today but was found to be extremely weak in general and required full assist.  Assessment & Plan:  acute saddle pulmonary embolus - extensive L LE DVT heparin drip per pharmacy until able to clarify oral tx options available per her insurance - TTE noted only moderate RV dysfunction - duplex confirms extensive DVT L LE - no clear inciting factor - elevated risk due to overweight and mobility limited by arthritis - 1 year full anticoagulation given the significant clot burden is likely most appropriate - as such time that she can temporarily safely come off of anticoagulation a reevaluation of ongoing clotting risk should be carried out  elevated troponin I  due to above - appears to have peaked at 0.5  essential hypertension Blood pressure stable today   diabetes type 2 CBG trending up - adjust treatment and follow  CHB s/p pacer   DVT prophylaxis: IV heparin  Code Status: FULL CODE Family Communication: Spoke with patient and 2 daughters at bedside at length Disposition Plan: Transfer to telemetry bed - therapy suggesting SNF but patient likely to refuse - investigate options for oral anticoagulation  Consultants:  PCCM  Procedures: TTE - preserved systolic function - moderately dilated RV with moderately decreased RV systolic function  Venous duplex - extensive left lower extremity acute DVT  Antimicrobials:    none  Objective: Blood pressure 118/71, pulse 93, temperature 99 F (37.2 C), temperature source Oral, resp. rate 11, height 5\' 5"  (1.651 m), weight 91.2 kg (201 lb), SpO2 97 %.  Intake/Output Summary (Last 24 hours) at 11/07/16 1545 Last data filed at 11/07/16 1400  Gross per 24 hour  Intake          1402.89 ml  Output              200 ml  Net          1202.89 ml   Filed Weights   11/04/16 1541  Weight: 91.2 kg (201 lb)    Examination: General: No acute respiratory distress Lungs: Clear to auscultation bilaterally - no wheezing  Cardiovascular: Regular rate and rhythm - no murmur  Abdomen: Nontender, nondistended, soft, bowel sounds positive, no rebound Extremities: No significant cyanosis, clubbing, edema bilateral lower extremities  CBC:  Recent Labs Lab 11/04/16 1633 11/05/16 0327 11/06/16 0238 11/07/16 0236  WBC 9.0 8.4 8.2 8.6  NEUTROABS 6.7  --   --   --   HGB 12.3 9.7* 9.8* 10.9*  HCT 35.6* 29.0* 29.1* 32.1*  MCV 112.7* 111.5* 112.4* 111.1*  PLT 184 175 156 [id number]   Basic Metabolic Panel:  Recent Labs Lab 11/04/16 1633 11/06/16 0238  NA 139 139  K 3.7 3.9  CL 104 106  CO2 26 28  GLUCOSE 225* 158*  BUN 13 13  CREATININE 0.73 0.92  CALCIUM 9.3 8.7*   GFR: Estimated Creatinine Clearance: 59.9 mL/min (by C-G formula based on SCr of 0.92 mg/dL).  Liver Function Tests:  Recent Labs Lab 11/04/16 1633  AST 17  ALT 14  ALKPHOS 77  BILITOT 0.9  PROT 7.5  ALBUMIN 3.6   Cardiac Enzymes:  Recent Labs Lab 11/04/16 1633 11/04/16 2302 11/05/16 0327 11/05/16 0929 11/06/16 0238  TROPONINI 0.18* 0.50* 0.47* 0.26* 0.19*    HbA1C: Hgb A1c MFr Bld  Date/Time Value Ref Range Status  11/23/2015 03:59 PM 7.6 (H) 4.8 - 5.6 % Final    Comment:    (NOTE)         Pre-diabetes: 5.7 - 6.4         Diabetes: >6.4         Glycemic control for adults with diabetes: <7.0     CBG:  Recent Labs Lab 11/06/16 2054 11/06/16 2247 11/07/16 0429  11/07/16 0843 11/07/16 1235  GLUCAP 195* 175* 149* 212* 253*    Recent Results (from the past 240 hour(s))  MRSA PCR Screening     Status: None   Collection Time: 11/05/16 12:08 AM  Result Value Ref Range Status   MRSA by PCR NEGATIVE NEGATIVE Final    Comment:        The GeneXpert MRSA Assay (FDA approved for NASAL specimens only), is one component of a comprehensive MRSA colonization surveillance program. It is not intended to diagnose MRSA infection nor to guide or monitor treatment for MRSA infections.      Scheduled Meds: . aspirin EC  81 mg Oral q morning - 10a  . atorvastatin  40 mg Oral QHS  . famotidine  20 mg Oral QHS  . insulin aspart  0-15 Units Subcutaneous TID WC  . insulin aspart  0-5 Units Subcutaneous QHS  . insulin aspart  4 Units Subcutaneous TID WC  . insulin glargine  30 Units Subcutaneous QHS  . metoCLOPramide  5 mg Oral TID AC & HS  . NIFEdipine  90 mg Oral q morning - 10a  . sodium chloride flush  3 mL Intravenous Q12H   Continuous Infusions: . heparin 1,300 Units/hr (11/07/16 0413)     LOS: 3 days   Cherene Altes, MD [facility] Office  [phone] Pager - Text Page per Shea Evans as per below:  On-Call/Text Page:      [email or web address]      password [email or web address]  If 7PM-7AM, please contact night-coverage [email or web address] Password [email or web address] 11/07/2016, 3:45 PM

## 2016-11-07 NOTE — NC FL2 (Signed)
[place] MEDICAID FL2 LEVEL OF CARE SCREENING TOOL     IDENTIFICATION  Patient Name: Natalie Castro Birthdate: [birth year] Sex: female Admission Date (Current Location): 11/04/2016  [facility] and [place] Number:  Herbalist and Address:  [facility], [place] [address] [place], [place] [address]      Provider Number: [id number]  Attending Physician Name and Address:  Cherene Altes, MD  Relative Name and Phone Number:  [name], daughter, [phone]    Current Level of Care: Hospital Recommended Level of Care: [place] Prior Approval Number:    Date Approved/Denied:   PASRR Number: [id number]  Discharge Plan: SNF    Current Diagnoses: Patient Active Problem List   Diagnosis Date Noted  . Saddle pulmonary embolus ([place]) 11/04/2016  . Elevated troponin I level 11/04/2016  . Complete heart block ([place]) 11/23/2015  . Diabetes type 2, controlled ([place]) 07/20/2009  . KNEE, ARTHRITIS, 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  . KNEE PAIN 07/20/2009  . Essential hypertension 07/20/2009    Orientation RESPIRATION BLADDER Height & Weight     Self, Time, Situation, Place  Normal Continent Weight: 91.2 kg (201 lb) Height:  5\' 5"  (165.1 cm)  BEHAVIORAL SYMPTOMS/MOOD NEUROLOGICAL BOWEL NUTRITION STATUS      Continent Diet (Please see DC Summary)  AMBULATORY STATUS COMMUNICATION OF NEEDS Skin   Limited Assist Verbally Normal                       Personal Care Assistance Level of Assistance  Bathing, Feeding, Dressing Bathing Assistance: Limited assistance Feeding assistance: Independent Dressing Assistance: Limited assistance     Functional Limitations Info             SPECIAL CARE FACTORS FREQUENCY  PT (By licensed PT)     PT Frequency: 5x/week              Contractures      Additional Factors Info  Code Status, Allergies, Insulin Sliding Scale Code Status Info: Full Allergies Info: Penicilins   Insulin Sliding  Scale Info: 8x/day       Current Medications (11/07/2016):  This is the current hospital active medication list Current Facility-Administered Medications  Medication Dose Route Frequency Provider Last Rate Last Dose  . acetaminophen (TYLENOL) tablet 650 mg  650 mg Oral Q6H PRN Truett Mainland, DO   650 mg at 11/07/16 [id number]   Or  . acetaminophen (TYLENOL) suppository 650 mg  650 mg Rectal Q6H PRN Truett Mainland, DO      . aspirin EC tablet 81 mg  81 mg Oral q morning - 10a Tanna Savoy Stinson, DO   81 mg at 11/07/16 [id number]  . atorvastatin (LIPITOR) tablet 40 mg  40 mg Oral QHS Tanna Savoy Stinson, DO   40 mg at 11/06/16 2247  . famotidine (PEPCID) tablet 20 mg  20 mg Oral QHS Tanna Savoy Stinson, DO   20 mg at 11/06/16 2247  . heparin ADULT infusion 100 units/mL (25000 units/225mL sodium chloride 0.45%)  1,300 Units/hr Intravenous Continuous Merrily Pew, MD 13 mL/hr at 11/07/16 0413 1,300 Units/hr at 11/07/16 0413  . insulin aspart (novoLOG) injection 0-15 Units  0-15 Units Subcutaneous TID WC Tanna Savoy Stinson, DO   8 Units at 11/07/16 1309  . insulin aspart (novoLOG) injection 0-5 Units  0-5 Units Subcutaneous QHS Tanna Savoy Stinson, DO      . insulin aspart (novoLOG) injection 4 Units  4 Units Subcutaneous TID WC  Truett Mainland, DO   4 Units at 11/07/16 1308  . insulin glargine (LANTUS) injection 30 Units  30 Units Subcutaneous QHS Tanna Savoy Stinson, DO   30 Units at 11/06/16 2248  . metoCLOPramide (REGLAN) tablet 5 mg  5 mg Oral TID AC & HS Tanna Savoy Stinson, DO   5 mg at 11/07/16 1220  . NIFEdipine (PROCARDIA XL/ADALAT-CC) 24 hr tablet 90 mg  90 mg Oral q morning - 10a Cherene Altes, MD   90 mg at 11/07/16 0738  . ondansetron (ZOFRAN) tablet 4 mg  4 mg Oral Q6H PRN Tanna Savoy Stinson, DO       Or  . ondansetron [facility]) injection 4 mg  4 mg Intravenous Q6H PRN Tanna Savoy Stinson, DO      . oxyCODONE (Oxy IR/ROXICODONE) immediate release tablet 5 mg  5 mg Oral Q4H PRN Tanna Savoy Stinson, DO   5 mg at 11/07/16 1220   . polyethylene glycol (MIRALAX / GLYCOLAX) packet 17 g  17 g Oral Daily PRN Tanna Savoy Stinson, DO      . sodium chloride flush (NS) 0.9 % injection 3 mL  3 mL Intravenous Q12H Tanna Savoy Stinson, DO   3 mL at 11/06/16 2251     Discharge Medications: Please see discharge summary for a list of discharge medications.  Relevant Imaging Results:  Relevant Lab Results:   Additional Information SSN: [place], [place]

## 2016-11-08 ENCOUNTER — Inpatient Hospital Stay (HOSPITAL_COMMUNITY): Payer: Medicare Other

## 2016-11-08 LAB — URINALYSIS, ROUTINE W REFLEX MICROSCOPIC
BILIRUBIN URINE: NEGATIVE
GLUCOSE, UA: NEGATIVE mg/dL
Ketones, ur: NEGATIVE mg/dL
Nitrite: NEGATIVE
PROTEIN: NEGATIVE mg/dL
SPECIFIC GRAVITY, URINE: 1.026 (ref 1.005–1.030)
pH: 6 (ref 5.0–8.0)

## 2016-11-08 LAB — URINE MICROSCOPIC-ADD ON

## 2016-11-08 LAB — CBC
HCT: 30.7 % — ABNORMAL LOW (ref 36.0–46.0)
HEMOGLOBIN: 10.5 g/dL — AB (ref 12.0–15.0)
MCH: 38 pg — AB (ref 26.0–34.0)
MCHC: 34.2 g/dL (ref 30.0–36.0)
MCV: 111.2 fL — ABNORMAL HIGH (ref 78.0–100.0)
PLATELETS: 187 10*3/uL (ref 150–400)
RBC: 2.76 MIL/uL — AB (ref 3.87–5.11)
RDW: 13.7 % (ref 11.5–15.5)
WBC: 9.9 10*3/uL (ref 4.0–10.5)

## 2016-11-08 LAB — GLUCOSE, CAPILLARY
GLUCOSE-CAPILLARY: 157 mg/dL — AB (ref 65–99)
GLUCOSE-CAPILLARY: 143 mg/dL — AB (ref 65–99)
GLUCOSE-CAPILLARY: 141 mg/dL — AB (ref 65–99)
GLUCOSE-CAPILLARY: 210 mg/dL — AB (ref 65–99)

## 2016-11-08 LAB — HEPARIN LEVEL (UNFRACTIONATED): HEPARIN UNFRACTIONATED: 0.33 [IU]/mL (ref 0.30–0.70)

## 2016-11-08 MED ORDER — APIXABAN 5 MG PO TABS
10.0000 mg | ORAL_TABLET | Freq: Two times a day (BID) | ORAL | Status: DC
  Administered 2016-11-08 – 2016-11-10 (×5): 10 mg via ORAL
  Filled 2016-11-08 (×5): qty 2

## 2016-11-08 MED ORDER — APIXABAN 5 MG PO TABS: 5.0000 mg | ORAL_TABLET | Freq: Two times a day (BID) | ORAL | Status: DC

## 2016-11-08 NOTE — Progress Notes (Addendum)
ANTICOAGULATION CONSULT NOTE - Follow Up Consult  Pharmacy Consult for IV heparin > switch  Indication: pulmonary embolus and DVT  Allergies  Allergen Reactions  . Penicillins Hives and Itching    Has patient had a PCN reaction causing immediate rash, facial/tongue/throat swelling, SOB or lightheadedness with hypotension: No Has patient had a PCN reaction causing severe rash involving mucus membranes or skin necrosis: Yes Has patient had a PCN reaction that required hospitalization No Has patient had a PCN reaction occurring within the last 10 years: No If all of the above answers are "NO", then may proceed with Cephalosporin use.     Patient Measurements: Height: 5\' 5"  (165.1 cm) Weight: 201 lb (91.2 kg) IBW/kg (Calculated) : 57 Heparin Dosing Weight: 77.2 kg  Vital Signs: Temp: 98.2 F (36.8 C) (11/14 0553) Temp Source: Oral (11/14 0553) BP: 136/63 (11/14 0553) Pulse Rate: 91 (11/14 0553)  Labs:  Recent Labs  11/06/16 0238 11/07/16 0236 11/08/16 0310  HGB 9.8* 10.9* 10.5*  HCT 29.1* 32.1* 30.7*  PLT 156 167 187  HEPARINUNFRC 0.62 0.41 0.33  CREATININE 0.92  --   --   TROPONINI 0.19*  --   --     Estimated Creatinine Clearance: 59.9 mL/min (by C-G formula based on SCr of 0.92 mg/dL).   Medications:  Infusions:  . heparin 1,300 Units/hr (11/08/16 0227)    Assessment: 75 yo F on IV heparin for submassive PE. Chest CT shows evidence of right heart strain and bilateral lower extremity venous duplex showed multiple DVTs. Heparin level remains therapeutic on IV heparin. Hgb stable at 10.5, plts wnl. No s/s of bleed.  Asked by Dr. Tyrell Antonio to switch to Eliquis this morning.  Goal of Therapy:  Heparin level 0.3-0.7 units/ml Monitor platelets by anticoagulation protocol: Yes   Plan:  1. D/c IV heparin. 2. Start Eliquis 10 mg BID x 7 days, then 5 mg BID. 3. Completed education with patient and daughter.  Uvaldo Rising, BCPS  Clinical  Pharmacist Pager [phone]  11/08/2016 10:15 AM

## 2016-11-08 NOTE — Progress Notes (Signed)
Natalie Castro  [id number] DOB: 03/14/1941 DOA: 11/04/2016 PCP: [name], MD    Brief Narrative:  75 y.o. female with a history of essential hypertension, type 2 diabetes, hyperlipidemia, complete heart block who reported dyspnea on exertion and mild chest pain for a couple of weeks.  In the ED CTA of the chest showed saddle embolus with right heart strain.   Subjective: She is feeling well, denies cough. No worsening dyspnea.  Denies dysuria.   Assessment & Plan:  Acute saddle pulmonary embolus - extensive L LE DVT; -Treated with heparin Gtt initially. Transition to Eliquis 11-14. Discussed option with patient and family. They are aware that there is not reversal agent. Patient would like to proceed with eliquis.  -TTE noted only moderate RV dysfunction - duplex confirms extensive DVT L LE - no clear inciting factor - elevated risk due to overweight and mobility limited by arthritis - 1 year full anticoagulation given the significant clot burden is likely most appropriate - as such time that she can temporarily safely come off of anticoagulation a reevaluation of ongoing clotting risk should be carried out  Fever;  Check ua, urine culture.  Chest x ray negative for infection.  If spike fever again, might need to start IV antibiotics.   elevated troponin I  due to above - appears to have peaked at 0.5  essential hypertension Blood pressure stable today   diabetes type 2 CBG trending up - adjust treatment and follow  CHB s/p pacer   DVT prophylaxis: IV heparin  Code Status: FULL CODE Family Communication: Spoke with patient and 2 daughters at bedside at length Disposition Plan: Transfer to telemetry bed - therapy suggesting SNF  - investigate options for oral anticoagulation  Consultants:  PCCM  Procedures: TTE - preserved systolic function - moderately dilated RV with moderately decreased RV systolic function  Venous duplex - extensive left lower extremity  acute DVT  Antimicrobials:    Objective: Blood pressure 136/63, pulse 91, temperature 98.2 F (36.8 C), temperature source Oral, resp. rate 18, height 5\' 5"  (1.651 m), weight 91.2 kg (201 lb), SpO2 96 %.  Intake/Output Summary (Last 24 hours) at 11/08/16 0949 Last data filed at 11/08/16 0900  Gross per 24 hour  Intake           891.56 ml  Output              350 ml  Net           541.56 ml   Filed Weights   11/04/16 1541  Weight: 91.2 kg (201 lb)    Examination: General: No acute respiratory distress Lungs: Clear to auscultation bilaterally - no wheezing  Cardiovascular: Regular rate and rhythm - no murmur  Abdomen: Nontender, nondistended, soft, bowel sounds positive, no rebound Extremities: No significant cyanosis, clubbing, edema bilateral lower extremities  CBC:  Recent Labs Lab 11/04/16 1633 11/05/16 0327 11/06/16 0238 11/07/16 0236 11/08/16 0310  WBC 9.0 8.4 8.2 8.6 9.9  NEUTROABS 6.7  --   --   --   --   HGB 12.3 9.7* 9.8* 10.9* 10.5*  HCT 35.6* 29.0* 29.1* 32.1* 30.7*  MCV 112.7* 111.5* 112.4* 111.1* 111.2*  PLT 184 175 156 167 [id number]   Basic Metabolic Panel:  Recent Labs Lab 11/04/16 1633 11/06/16 0238  NA 139 139  K 3.7 3.9  CL 104 106  CO2 26 28  GLUCOSE 225* 158*  BUN 13 13  CREATININE 0.73 0.92  CALCIUM 9.3 8.7*  GFR: Estimated Creatinine Clearance: 59.9 mL/min (by C-G formula based on SCr of 0.92 mg/dL).  Liver Function Tests:  Recent Labs Lab 11/04/16 1633  AST 17  ALT 14  ALKPHOS 77  BILITOT 0.9  PROT 7.5  ALBUMIN 3.6   Cardiac Enzymes:  Recent Labs Lab 11/04/16 1633 11/04/16 2302 11/05/16 0327 11/05/16 0929 11/06/16 0238  TROPONINI 0.18* 0.50* 0.47* 0.26* 0.19*    HbA1C: Hgb A1c MFr Bld  Date/Time Value Ref Range Status  11/23/2015 03:59 PM 7.6 (H) 4.8 - 5.6 % Final    Comment:    (NOTE)         Pre-diabetes: 5.7 - 6.4         Diabetes: >6.4         Glycemic control for adults with diabetes: <7.0      CBG:  Recent Labs Lab 11/07/16 0843 11/07/16 1235 11/07/16 1733 11/07/16 2110 11/08/16 0616  GLUCAP 212* 253* 166* 187* 157*    Recent Results (from the past 240 hour(s))  MRSA PCR Screening     Status: None   Collection Time: 11/05/16 12:08 AM  Result Value Ref Range Status   MRSA by PCR NEGATIVE NEGATIVE Final    Comment:        The GeneXpert MRSA Assay (FDA approved for NASAL specimens only), is one component of a comprehensive MRSA colonization surveillance program. It is not intended to diagnose MRSA infection nor to guide or monitor treatment for MRSA infections.      Scheduled Meds: . aspirin EC  81 mg Oral q morning - 10a  . atorvastatin  40 mg Oral QHS  . famotidine  20 mg Oral QHS  . insulin aspart  0-15 Units Subcutaneous TID WC  . insulin aspart  0-5 Units Subcutaneous QHS  . insulin aspart  6 Units Subcutaneous TID WC  . insulin glargine  32 Units Subcutaneous QHS  . metFORMIN  1,000 mg Oral BID WC  . metoCLOPramide  5 mg Oral TID AC & HS  . NIFEdipine  90 mg Oral q morning - 10a  . sodium chloride flush  3 mL Intravenous Q12H   Continuous Infusions: . heparin 1,300 Units/hr (11/08/16 0227)     LOS: 4 days   Niel Hummer,  MD [facility] [phone] Pager - Text Page per Shea Evans as per below:  On-Call/Text Page:      [email or web address]      password [email or web address]  If 7PM-7AM, please contact night-coverage [email or web address] Password [facility] 11/08/2016, 9:49 AM

## 2016-11-08 NOTE — Progress Notes (Signed)
Insurance check completed per MD request for Eliquis, Xarelto, Pradaxa S/W [facility] @ [facility] #  [phone]   1.ELIQUIS 2.5 MG BID     COVER- YES               CO-PAY- $ 45.00   Q/L 2 PER DAY          TIER- 3 DRUG               PRIOR APPROVAL- YES # [id number]       1. ELIQUIS  5 MG BID  SAME AS ABOVE   2. XARELTO 15 MG BID FOR 21 DAYS  COVER- YES  CO-PAY- $ 45.00  Q/L 2 PER DAY  TIER- 3 DRUG  PRIOR APPROVAL- YES # [id number]   2. XARELTO 20 MG QD  SAME AS ABOVE    3.PRADAXA 150 BID  COVER- YES  CO-PAY- $ 95.00 Q/L 2 PER DAY  TIER-4 DRUG  PRIOR APPROVAL- YES [phone]    PHARMACY : [facility] [place], [facility], [facility] AND  [facility]

## 2016-11-08 NOTE — Care Management Note (Signed)
Case Management Note Marvetta Gibbons RN, BSN Unit 2W-Case Manager [phone]  Patient Details  Name: Natalie Castro MRN: [id number] Date of Birth: 1941-07-31  Subjective/Objective:  Pt admitted with bil PEand multiple DVT                   Action/Plan: PTA pt lived at home with spouse, PT recommendation for SNF- CSW has been consulted for possible STSNF placement- pt has been started on Eliquis- per insurance check- .ELIQUIS 2.5 MG BID     COVER- YES               CO-PAY- $ 45.00   Q/L 2 PER DAY          TIER- 3 DRUG               PRIOR APPROVAL- YES # [id number]       1. ELIQUIS  5 MG BID  SAME AS ABOVE ----   spoke with pt at bedside- coverage info for Eliquis shared with pt and 30 day free card given to pt- plan at this time is for STSNF- CSW following.     Expected Discharge Date:                  Expected Discharge Plan:  [place]  In-House Referral:  Clinical Social Work  Discharge planning Services  CM Consult, Medication Assistance  Post Acute Care Choice:    Choice offered to:     DME Arranged:    DME Agency:     HH Arranged:    HH Agency:     Status of Service:  In process, will continue to follow  If discussed at Long Length of Stay Meetings, dates discussed:    Additional Comments:  Dawayne Patricia, RN 11/08/2016, 2:37 PM

## 2016-11-08 NOTE — Care Management Important Message (Signed)
Important Message  Patient Details  Name: Natalie Castro MRN: [id number] Date of Birth: 1941-06-10   Medicare Important Message Given:  Yes    [name] May 11/08/2016, 8:43 AM

## 2016-11-08 NOTE — Discharge Instructions (Signed)
Information on my medicine - ELIQUIS (apixaban)  This medication education was reviewed with me or my healthcare representative as part of my discharge preparation.  The pharmacist that spoke with me during my hospital stay was:  Pat Patrick, [facility]  Why was Eliquis prescribed for you? Eliquis was prescribed to treat blood clots that may have been found in the veins of your legs (deep vein thrombosis) or in your lungs (pulmonary embolism) and to reduce the risk of them occurring again.  What do You need to know about Eliquis ? The starting dose is 10 mg (two 5 mg tablets) taken TWICE daily for the FIRST SEVEN (7) DAYS, then on (enter date)  11/15/16  the dose is reduced to ONE 5 mg tablet taken TWICE daily.  Eliquis may be taken with or without food.   Try to take the dose about the same time in the morning and in the evening. If you have difficulty swallowing the tablet whole please discuss with your pharmacist how to take the medication safely.  Take Eliquis exactly as prescribed and DO NOT stop taking Eliquis without talking to the doctor who prescribed the medication.  Stopping may increase your risk of developing a new blood clot.  Refill your prescription before you run out.  After discharge, you should have regular check-up appointments with your healthcare provider that is prescribing your Eliquis.    What do you do if you miss a dose? If a dose of ELIQUIS is not taken at the scheduled time, take it as soon as possible on the same day and twice-daily administration should be resumed. The dose should not be doubled to make up for a missed dose.  Important Safety Information A possible side effect of Eliquis is bleeding. You should call your healthcare provider right away if you experience any of the following: ? Bleeding from an injury or your nose that does not stop. ? Unusual colored urine (red or dark brown) or unusual colored stools (red or black). ? Unusual bruising  for unknown reasons. ? A serious fall or if you hit your head (even if there is no bleeding).  Some medicines may interact with Eliquis and might increase your risk of bleeding or clotting while on Eliquis. To help avoid this, consult your healthcare provider or pharmacist prior to using any new prescription or non-prescription medications, including herbals, vitamins, non-steroidal anti-inflammatory drugs (NSAIDs) and supplements.  This website has more information on Eliquis (apixaban): http://[email or web address].com/eliquis/home

## 2016-11-08 NOTE — Consult Note (Signed)
   [facility] CM Primary Care Navigator  11/08/2016  Natalie Castro 1941/09/21 [id number]   Met with patient and daughter [name]) at the bedside to identify possible discharge needs.  Patient verbalized "not feeling well", increased shortness of breath with activity and chest pain relieved with rest that had led to this admission. Patient reports Dr. Truett Mainland with [facility] office as her primary care provider.    Patient states using [facility] to obtain medications with no problem at this time. She reports managing her own medications at home using blistered pack system.  Patient's husband [name]) provides transportation to her doctors' appointments as stated.  Daughters Little Silver, Vega and [name]) will serve asprimary caregivers for patient when she discharges home. Patient lives with husband at home.  Discharge plan is for skilled nursing facility for short term rehabilitation prior to going back home as stated by daughter.Awaiting for list of skilled nursing facilities to choose from.  Patient and daughter voiced understanding to call primary care provider's office once discharged home, for a post discharge follow-up appointment within a week or sooner if needs arise. Patient letter provided as her reminder.  Patient and daughter Erline Hau needs for disease management/ education and care coordination at this time.   For additional questions please contact:  Edwena Felty A. Tangi Shroff, BSN, RN-BC [facility] PRIMARY CARE Navigator Cell: [phone]

## 2016-11-09 LAB — BASIC METABOLIC PANEL WITH GFR
Anion gap: 7 (ref 5–15)
BUN: 18 mg/dL (ref 6–20)
CO2: 28 mmol/L (ref 22–32)
Calcium: 8.8 mg/dL — ABNORMAL LOW (ref 8.9–10.3)
Chloride: 104 mmol/L (ref 101–111)
Creatinine, Ser: 0.77 mg/dL (ref 0.44–1.00)
GFR calc Af Amer: >60 mL/min
GFR calc non Af Amer: >60 mL/min
Glucose, Bld: 108 mg/dL — ABNORMAL HIGH (ref 65–99)
Potassium: 4 mmol/L (ref 3.5–5.1)
Sodium: 139 mmol/L (ref 135–145)

## 2016-11-09 LAB — CBC
HEMATOCRIT: 31 % — AB (ref 36.0–46.0)
Hemoglobin: 10.5 g/dL — ABNORMAL LOW (ref 12.0–15.0)
MCH: 37.8 pg — AB (ref 26.0–34.0)
MCHC: 33.9 g/dL (ref 30.0–36.0)
MCV: 111.5 fL — AB (ref 78.0–100.0)
Platelets: 214 10*3/uL (ref 150–400)
RBC: 2.78 MIL/uL — AB (ref 3.87–5.11)
RDW: 13.5 % (ref 11.5–15.5)
WBC: 8.7 10*3/uL (ref 4.0–10.5)

## 2016-11-09 LAB — HEPARIN LEVEL (UNFRACTIONATED)

## 2016-11-09 LAB — GLUCOSE, CAPILLARY
Glucose-Capillary: 104 mg/dL — ABNORMAL HIGH (ref 65–99)
Glucose-Capillary: 144 mg/dL — ABNORMAL HIGH (ref 65–99)
Glucose-Capillary: 124 mg/dL — ABNORMAL HIGH (ref 65–99)
Glucose-Capillary: 138 mg/dL — ABNORMAL HIGH (ref 65–99)

## 2016-11-09 NOTE — Progress Notes (Signed)
PROGRESS NOTE  Natalie Castro [id number] DOB: August 29, 1941 DOA: 11/04/2016 PCP: [name], MD   LOS: 5 days   Brief Narrative: 75 y.o.femalewith a history of essential hypertension, type 2 diabetes, hyperlipidemia, complete heart block who reported dyspnea on exertion and mild chest pain for a couple of weeks. In the ED CTA of the chest showed saddle embolus with right heart strain.   Assessment & Plan: Principal Problem:   Saddle pulmonary embolus (HCC) Active Problems:   Diabetes type 2, controlled ([place])   Essential hypertension   Elevated troponin I level   Acute saddle pulmonary embolus - extensive L LE DVT; - Treated with heparin Gtt initially. Transition to Eliquis 11-14. Discussed option with patient and family. They are aware that there is not reversal agent. Patient would like to proceed with eliquis.  - TTE noted only moderate RV dysfunction - duplex confirms extensive DVT L LE - no clear inciting factor - elevated risk due to overweight and mobility limited by arthritis - 1 year full anticoagulation given the significant clot burden is likely most appropriate - as such time that she can temporarily safely come off of anticoagulation a reevaluation of ongoing clotting risk should be carried out  Fever - check ua, urine culture pending, not resulted - Chest x ray negative for infection.  - If spike fever again, might need to start IV antibiotics, d/c to SNF tomorrow if remains afebrile tonight  elevated troponin I  - due to above - appears to have peaked at 0.5  essential hypertension - Blood pressure stable today   diabetes type 2 - CBG trending up - adjust treatment and follow  CHB s/p pacer   DVT prophylaxis: Eliquis Code Status: Full Family Communication: daughters bedside Disposition Plan: SNF 1 day  Consultants:   None   Procedures:  TTE - preserved systolic function - moderately dilated RV with moderately decreased RV systolic function    Venous duplex - extensive left lower extremity acute DVT  Antimicrobials:  None    Subjective: - no chest pain, shortness of breath, no abdominal pain, nausea or vomiting.   Objective: Vitals:   11/08/16 0553 11/08/16 1300 11/08/16 2115 11/09/16 0504  BP: 136/63 125/62 134/69 (!) 118/53  Pulse: 91 93 (!) 104 91  Resp: 18 18 18 18   Temp: 98.2 F (36.8 C) 98.9 F (37.2 C) 99.8 F (37.7 C) 98.2 F (36.8 C)  TempSrc: Oral Oral Oral Oral  SpO2: 96% 95% 92% 94%  Weight:      Height:        Intake/Output Summary (Last 24 hours) at 11/09/16 1157 Last data filed at 11/09/16 0830  Gross per 24 hour  Intake              840 ml  Output              200 ml  Net              640 ml   Filed Weights   11/04/16 1541  Weight: 91.2 kg (201 lb)    Examination: Constitutional: NAD Vitals:   11/08/16 0553 11/08/16 1300 11/08/16 2115 11/09/16 0504  BP: 136/63 125/62 134/69 (!) 118/53  Pulse: 91 93 (!) 104 91  Resp: 18 18 18 18   Temp: 98.2 F (36.8 C) 98.9 F (37.2 C) 99.8 F (37.7 C) 98.2 F (36.8 C)  TempSrc: Oral Oral Oral Oral  SpO2: 96% 95% 92% 94%  Weight:      Height:  Eyes: PERRL, lids and conjunctivae normal Respiratory: clear to auscultation bilaterally, no wheezing, no crackles. Cardiovascular: Regular rate and rhythm, no murmurs / rubs / gallops.  Abdomen: no tenderness. Bowel sounds positive.  Musculoskeletal: no clubbing / cyanosis.  Skin: no rashes   Data Reviewed: I have personally reviewed following labs and imaging studies  CBC:  Recent Labs Lab 11/04/16 1633 11/05/16 0327 11/06/16 0238 11/07/16 0236 11/08/16 0310 11/09/16 0339  WBC 9.0 8.4 8.2 8.6 9.9 8.7  NEUTROABS 6.7  --   --   --   --   --   HGB 12.3 9.7* 9.8* 10.9* 10.5* 10.5*  HCT 35.6* 29.0* 29.1* 32.1* 30.7* 31.0*  MCV 112.7* 111.5* 112.4* 111.1* 111.2* 111.5*  PLT 184 175 156 167 187 [id number]   Basic Metabolic Panel:  Recent Labs Lab 11/04/16 1633 11/06/16 0238  11/09/16 0339  NA 139 139 139  K 3.7 3.9 4.0  CL 104 106 104  CO2 26 28 28   GLUCOSE 225* 158* 108*  BUN 13 13 18   CREATININE 0.73 0.92 0.77  CALCIUM 9.3 8.7* 8.8*   GFR: Estimated Creatinine Clearance: 68.9 mL/min (by C-G formula based on SCr of 0.77 mg/dL). Liver Function Tests:  Recent Labs Lab 11/04/16 1633  AST 17  ALT 14  ALKPHOS 77  BILITOT 0.9  PROT 7.5  ALBUMIN 3.6   No results for input(s): LIPASE, AMYLASE in the last 168 hours. No results for input(s): AMMONIA in the last 168 hours. Coagulation Profile: No results for input(s): INR, PROTIME in the last 168 hours. Cardiac Enzymes:  Recent Labs Lab 11/04/16 1633 11/04/16 2302 11/05/16 0327 11/05/16 0929 11/06/16 0238  TROPONINI 0.18* 0.50* 0.47* 0.26* 0.19*   BNP (last 3 results) No results for input(s): PROBNP in the last 8760 hours. HbA1C: No results for input(s): HGBA1C in the last 72 hours. CBG:  Recent Labs Lab 11/08/16 1122 11/08/16 1659 11/08/16 2113 11/09/16 0602 11/09/16 1121  GLUCAP 143* 141* 210* 104* 144*   Lipid Profile: No results for input(s): CHOL, HDL, LDLCALC, TRIG, CHOLHDL, LDLDIRECT in the last 72 hours. Thyroid Function Tests: No results for input(s): TSH, T4TOTAL, FREET4, T3FREE, THYROIDAB in the last 72 hours. Anemia Panel: No results for input(s): VITAMINB12, FOLATE, FERRITIN, TIBC, IRON, RETICCTPCT in the last 72 hours. Urine analysis:    Component Value Date/Time   COLORURINE AMBER (A) 11/08/2016 1605   APPEARANCEUR CLOUDY (A) 11/08/2016 1605   LABSPEC 1.026 11/08/2016 1605   PHURINE 6.0 11/08/2016 1605   GLUCOSEU NEGATIVE 11/08/2016 1605   HGBUR TRACE (A) 11/08/2016 1605   BILIRUBINUR NEGATIVE 11/08/2016 1605   KETONESUR NEGATIVE 11/08/2016 1605   PROTEINUR NEGATIVE 11/08/2016 1605   UROBILINOGEN 0.2 06/18/2015 2300   NITRITE NEGATIVE 11/08/2016 1605   LEUKOCYTESUR TRACE (A) 11/08/2016 1605   Sepsis Labs: Invalid input(s): PROCALCITONIN,  LACTICIDVEN  Recent Results (from the past 240 hour(s))  MRSA PCR Screening     Status: None   Collection Time: 11/05/16 12:08 AM  Result Value Ref Range Status   MRSA by PCR NEGATIVE NEGATIVE Final    Comment:        The GeneXpert MRSA Assay (FDA approved for NASAL specimens only), is one component of a comprehensive MRSA colonization surveillance program. It is not intended to diagnose MRSA infection nor to guide or monitor treatment for MRSA infections.       Radiology Studies: Dg Chest Port 1 View  Result Date: 11/08/2016 CLINICAL DATA:  75 year old female with fever. Subsequent encounter. EXAM:  PORTABLE CHEST 1 VIEW COMPARISON:  11/04/2016, 11/25/2015 and 02/17/2008. FINDINGS: Sequential pacemaker enters from the left with leads in relatively similar position. Cardiomegaly. No infiltrate, congestive heart failure or pneumothorax. Calcified aorta No plain film evidence of pulmonary malignancy. IMPRESSION: No infiltrate or congestive heart failure. Pacemaker in place with mild cardiomegaly. Aortic atherosclerosis. Electronically Signed   By: Genia Del M.D.   On: 11/08/2016 12:07     Scheduled Meds: . apixaban  10 mg Oral BID   Followed by  . [START ON 11/15/2016] apixaban  5 mg Oral BID  . aspirin EC  81 mg Oral q morning - 10a  . atorvastatin  40 mg Oral QHS  . famotidine  20 mg Oral QHS  . insulin aspart  0-15 Units Subcutaneous TID WC  . insulin aspart  0-5 Units Subcutaneous QHS  . insulin aspart  6 Units Subcutaneous TID WC  . insulin glargine  32 Units Subcutaneous QHS  . metoCLOPramide  5 mg Oral TID AC & HS  . NIFEdipine  90 mg Oral q morning - 10a  . sodium chloride flush  3 mL Intravenous Q12H   Continuous Infusions:   Marzetta Board, MD, PhD [facility] Pager [phone]  If 7PM-7AM, please contact night-coverage [email or web address] Password [email or web address] 11/09/2016, 11:57 AM

## 2016-11-09 NOTE — Clinical Social Work Note (Signed)
CSW provided family with SNF responses and their preference is [facility]. Contact made with SW Lieutenant Diego at [facility] and they can take patient on Thursday, 11/16. CSW will continue to follow and assist with discharge to a skilled nursing facility when medically stable.  Ellis Mehaffey Givens, MSW, LCSW Licensed Clinical Social Worker [place] [phone]

## 2016-11-09 NOTE — Progress Notes (Signed)
Physical Therapy Treatment Patient Details Name: Natalie Castro MRN: [id number] DOB: [birth year] Today's Date: 11/09/2016    History of Present Illness Pt admitted with saddle pulmonary embolus. PMH: HTN, DM, HLD, complete heart block, arthritis in knees.    PT Comments    Pt able to ambulate 10' today with RW and min A +2, was very encouraged to be out of the bed and walking. Limited by fatigue. PT will continue to follow.   Follow Up Recommendations  SNF;Supervision/Assistance - 24 hour     Equipment Recommendations  Rolling walker with 5" wheels    Recommendations for Other Services       Precautions / Restrictions Precautions Precautions: Fall Restrictions Weight Bearing Restrictions: No    Mobility  Bed Mobility Overal bed mobility: Needs Assistance Bed Mobility: Supine to Sit     Supine to sit: Supervision     General bed mobility comments: pt needed increased time to get to EOB but no physical assistance  Transfers Overall transfer level: Needs assistance Equipment used: Rolling walker (2 wheeled) Transfers: Sit to/from Stand Sit to Stand: Min assist;+2 physical assistance         General transfer comment: min A +2 for power up and steadying as well as increased time  Ambulation/Gait Ambulation/Gait assistance: Min assist;+2 safety/equipment Ambulation Distance (Feet): 10 Feet Assistive device: Rolling walker (2 wheeled) Gait Pattern/deviations: Shuffle;Trunk flexed Gait velocity: decreased Gait velocity interpretation: <1.8 ft/sec, indicative of risk for recurrent falls General Gait Details: Min A to steady, pt with fwd flexed posture and increased fatigue so could not achieve complete upright, chair brought behind pt for safety. Decreased step height   Stairs            Wheelchair Mobility    Modified Rankin (Stroke Patients Only)       Balance Overall balance assessment: Needs assistance Sitting-balance support: Single extremity  supported Sitting balance-Leahy Scale: Poor Sitting balance - Comments: relies on UE for support EOB   Standing balance support: Bilateral upper extremity supported Standing balance-Leahy Scale: Poor Standing balance comment: heavy reliance on RW                    Cognition Arousal/Alertness: Awake/alert Behavior During Therapy: WFL for tasks assessed/performed Overall Cognitive Status: Within Functional Limits for tasks assessed                      Exercises General Exercises - Lower Extremity Long Arc Quad: AROM;Both;10 reps;Seated    General Comments General comments (skin integrity, edema, etc.): pt encouraged to be able to ambulate any distance      Pertinent Vitals/Pain Pain Assessment: No/denies pain    Home Living                      Prior Function            PT Goals (current goals can now be found in the care plan section) Acute Rehab PT Goals Patient Stated Goal: return home PT Goal Formulation: With patient/family Time For Goal Achievement: 11/21/16 Potential to Achieve Goals: Fair Progress towards PT goals: Progressing toward goals    Frequency    Min 2X/week      PT Plan Current plan remains appropriate    Co-evaluation             End of Session Equipment Utilized During Treatment: Gait belt Activity Tolerance: Patient tolerated treatment well;Patient limited by fatigue Patient left: in  chair;with call bell/phone within reach;with family/visitor present;with chair alarm set     Time: [phone] PT Time Calculation (min) (ACUTE ONLY): 19 min  Charges:  $Gait Training: 8-22 mins                    G Codes:     Leighton Roach, PT  Acute Rehab Services  [place] 11/09/2016, 4:31 PM

## 2016-11-10 ENCOUNTER — Inpatient Hospital Stay
Admission: RE | Admit: 2016-11-10 | Discharge: 2016-11-30 | Disposition: A | Discharge status: Home / Self Care | Payer: Medicare Other | Source: Ambulatory Visit | Attending: Internal Medicine | Admitting: Internal Medicine

## 2016-11-10 DIAGNOSIS — M130 Polyarthritis, unspecified: Secondary | ICD-10-CM | POA: Diagnosis not present

## 2016-11-10 DIAGNOSIS — I442 Atrioventricular block, complete: Secondary | ICD-10-CM | POA: Diagnosis not present

## 2016-11-10 DIAGNOSIS — R293 Abnormal posture: Secondary | ICD-10-CM | POA: Diagnosis not present

## 2016-11-10 DIAGNOSIS — Z794 Long term (current) use of insulin: Secondary | ICD-10-CM | POA: Diagnosis not present

## 2016-11-10 DIAGNOSIS — M1990 Unspecified osteoarthritis, unspecified site: Secondary | ICD-10-CM | POA: Diagnosis not present

## 2016-11-10 DIAGNOSIS — R2681 Unsteadiness on feet: Secondary | ICD-10-CM | POA: Diagnosis not present

## 2016-11-10 DIAGNOSIS — K219 Gastro-esophageal reflux disease without esophagitis: Secondary | ICD-10-CM | POA: Diagnosis not present

## 2016-11-10 DIAGNOSIS — D649 Anemia, unspecified: Secondary | ICD-10-CM | POA: Diagnosis not present

## 2016-11-10 DIAGNOSIS — R42 Dizziness and giddiness: Secondary | ICD-10-CM | POA: Diagnosis not present

## 2016-11-10 DIAGNOSIS — I82402 Acute embolism and thrombosis of unspecified deep veins of left lower extremity: Secondary | ICD-10-CM | POA: Diagnosis not present

## 2016-11-10 DIAGNOSIS — E119 Type 2 diabetes mellitus without complications: Secondary | ICD-10-CM | POA: Diagnosis not present

## 2016-11-10 DIAGNOSIS — M6281 Muscle weakness (generalized): Secondary | ICD-10-CM | POA: Diagnosis not present

## 2016-11-10 DIAGNOSIS — E785 Hyperlipidemia, unspecified: Secondary | ICD-10-CM | POA: Diagnosis not present

## 2016-11-10 DIAGNOSIS — I10 Essential (primary) hypertension: Secondary | ICD-10-CM | POA: Diagnosis not present

## 2016-11-10 DIAGNOSIS — I2692 Saddle embolus of pulmonary artery without acute cor pulmonale: Secondary | ICD-10-CM | POA: Diagnosis not present

## 2016-11-10 DIAGNOSIS — Z7901 Long term (current) use of anticoagulants: Secondary | ICD-10-CM | POA: Diagnosis not present

## 2016-11-10 DIAGNOSIS — I82432 Acute embolism and thrombosis of left popliteal vein: Secondary | ICD-10-CM | POA: Diagnosis not present

## 2016-11-10 DIAGNOSIS — N390 Urinary tract infection, site not specified: Secondary | ICD-10-CM | POA: Diagnosis not present

## 2016-11-10 LAB — URINE CULTURE: Culture: 80000 — AB

## 2016-11-10 LAB — CBC
HCT: 31 % — ABNORMAL LOW (ref 36.0–46.0)
HEMOGLOBIN: 10.7 g/dL — AB (ref 12.0–15.0)
MCH: 38.5 pg — ABNORMAL HIGH (ref 26.0–34.0)
MCHC: 34.5 g/dL (ref 30.0–36.0)
MCV: 111.5 fL — ABNORMAL HIGH (ref 78.0–100.0)
PLATELETS: 191 10*3/uL (ref 150–400)
RBC: 2.78 MIL/uL — AB (ref 3.87–5.11)
RDW: 13.4 % (ref 11.5–15.5)
WBC: 7.5 10*3/uL (ref 4.0–10.5)

## 2016-11-10 LAB — GLUCOSE, CAPILLARY
GLUCOSE-CAPILLARY: 132 mg/dL — AB (ref 65–99)
GLUCOSE-CAPILLARY: 133 mg/dL — AB (ref 65–99)

## 2016-11-10 MED ORDER — APIXABAN 5 MG PO TABS: 10.0000 mg | ORAL_TABLET | Freq: Two times a day (BID) | ORAL | Status: DC

## 2016-11-10 MED ORDER — CIPROFLOXACIN HCL 500 MG PO TABS
500.0000 mg | ORAL_TABLET | Freq: Two times a day (BID) | ORAL | Status: DC
  Administered 2016-11-10: 500 mg via ORAL
  Filled 2016-11-10: qty 1

## 2016-11-10 MED ORDER — CIPROFLOXACIN HCL 500 MG PO TABS: 500.0000 mg | ORAL_TABLET | Freq: Two times a day (BID) | ORAL | Status: DC

## 2016-11-10 MED ORDER — OXYCODONE HCL 5 MG PO TABS: 5.0000 mg | ORAL_TABLET | ORAL | 0 refills | Status: DC | PRN

## 2016-11-10 NOTE — Clinical Social Work Placement (Signed)
   CLINICAL SOCIAL WORK PLACEMENT  NOTE  Date:  11/10/2016  Patient Details  Name: Natalie Castro MRN: [id number] Date of Birth: October 15, 1941  Clinical Social Work is seeking post-discharge placement for this patient at the [place] level of care (*CSW will initial, date and re-position this form in  chart as items are completed):  Yes   Patient/family provided with [facility] Work Department's list of facilities offering this level of care within the geographic area requested by the patient (or if unable, by the patient's family).  Yes   Patient/family informed of their freedom to choose among providers that offer the needed level of care, that participate in Medicare, Medicaid or managed care program needed by the patient, have an available bed and are willing to accept the patient.  Yes   Patient/family informed of 's ownership interest in [facility] and [facility], as well as of the fact that they are under no obligation to receive care at these facilities.  PASRR submitted to EDS on 11/07/16     PASRR number received on 11/07/16     Existing PASRR number confirmed on       FL2 transmitted to all facilities in geographic area requested by pt/family on 11/07/16     FL2 transmitted to all facilities within larger geographic area on       Patient informed that his/her managed care company has contracts with or will negotiate with certain facilities, including the following:        Yes   Patient/family informed of bed offers received.  Patient chooses bed at [facility]     Physician recommends and patient chooses bed at      Patient to be transferred to [facility] on 11/10/16.  Patient to be transferred to facility by Ambulance     Patient family notified on 11/10/16 of transfer.  Name of family member notified:  Patient Daughter at bedside     PHYSICIAN Please sign FL2     Additional Comment:     Barbette Or, [place]

## 2016-11-10 NOTE — Discharge Summary (Signed)
Physician Discharge Summary  Natalie Castro [id number] DOB: [birth year] DOA: 11/04/2016  PCP: Natalie Fire, MD  Admit date: 11/04/2016 Discharge date: 11/10/2016  Admitted From: home Disposition:  SNF  Recommendations for Outpatient Follow-up:  1. Follow up with PCP in 1-2 weeks 2. Patient started on Eliquis for PE, continue 10 mg twice daily up until 10/21 when she needs to be transitioned to 5 mg twice daily 3. Ciprofloxacin for 3 additional days   Home Health: none Equipment/Devices: none  Discharge Condition: stable CODE STATUS: Full Diet recommendation: heart healthy / diabetic  HPI: Per Dr. Nehemiah Castro, Natalie Castro is a 75 y.o. female with a history of essential hypertension, type 2 diabetes, hyperlipidemia, complete heart block. Patient has been having a couple weeks of not feeling well with increased dyspnea on exertion and mild chest pain or the past couple of weeks. This has been worsening is acutely worse today. Symptoms are worse with activity and improved with rest. Patient came to emergency department for evaluation.  Emergency department course: Patient had a d-dimer that was 15 and a CTA of the chest which showed subtle embolus with right heart strain. Patient has not been hypotensive and has been on room air. PCN was consulted, who determined the patient could be admitted to stepdown, although they would evaluate the patient upon arrival at [facility] to see if condition is changed and whether the patient needed intervention.  Hospital Course: Discharge Diagnoses:  Principal Problem:   Saddle pulmonary embolus ([place]) Active Problems:   Diabetes type 2, controlled ([place])   Essential hypertension   Elevated troponin I level  Acute saddle pulmonary embolus - extensive L LE DVT - Treated with heparin Gtt initially. Transition to Eliquis 11-14. Discussed option with patient and family. They are aware that there is not reversal agent. Patient would like to proceed  with eliquis - TTE noted only moderate RV dysfunction - duplex confirms extensive DVT L LE - no clear inciting factor - elevated risk due to overweight and mobility limited by arthritis - 1 year full anticoagulation given the significant clot burden is likely most appropriate - as such time that she can temporarily safely come off of anticoagulation a reevaluation of ongoing clotting risk should be carried out Fever due to UTI - patient with slight evidence of UTI, urine cultures with Proteus pan-sensitive, place on Ciprofloxacin for 3 days. Asymptomatic, fever resolved.  elevated troponin I - due to above - appears to have peaked at 0.5 essential hypertension - Blood pressure stable diabetes type 2 - continue home regimen  CHB s/p pacer    Discharge Instructions     Medication List    STOP taking these medications   aspirin EC 81 MG tablet   ibuprofen 200 MG tablet Commonly known as:  ADVIL,MOTRIN     TAKE these medications   apixaban 5 MG Tabs tablet Commonly known as:  ELIQUIS Take 2 tablets (10 mg total) by mouth 2 (two) times daily. For 4 more days, on Tuesday, 11/15/16, change dose to 5 mg twice daily   atorvastatin 40 MG tablet Commonly known as:  LIPITOR Take 40 mg by mouth at bedtime.   ciprofloxacin 500 MG tablet Commonly known as:  CIPRO Take 1 tablet (500 mg total) by mouth 2 (two) times daily. For 3 days   hydrALAZINE 25 MG tablet Commonly known as:  APRESOLINE Take 25 mg by mouth 2 (two) times daily.   LANTUS SOLOSTAR 100 UNIT/ML Solostar Pen Generic drug:  Insulin Glargine Inject 30 Units into the skin at bedtime.   losartan 100 MG tablet Commonly known as:  COZAAR Take 100 mg by mouth every morning.   metFORMIN 1000 MG tablet Commonly known as:  GLUCOPHAGE Take 1,000 mg by mouth 2 (two) times daily with a meal.   NIFEdipine 90 MG 24 hr tablet Commonly known as:  PROCARDIA XL/ADALAT-CC Take 90 mg by mouth every morning.   oxyCODONE 5 MG immediate  release tablet Commonly known as:  Oxy IR/ROXICODONE Take 1 tablet (5 mg total) by mouth every 4 (four) hours as needed for moderate pain.   ranitidine 300 MG tablet Commonly known as:  ZANTAC Take 300 mg by mouth every morning.      Follow-up Information    FANTA,TESFAYE, MD. Schedule an appointment as soon as possible for a visit in 1 month(s).   Specialty:  Internal Medicine Contact information: [place] [address] [phone]          Allergies  Allergen Reactions  . Penicillins Hives and Itching    Has patient had a PCN reaction causing immediate rash, facial/tongue/throat swelling, SOB or lightheadedness with hypotension: No Has patient had a PCN reaction causing severe rash involving mucus membranes or skin necrosis: Yes Has patient had a PCN reaction that required hospitalization No Has patient had a PCN reaction occurring within the last 10 years: No If all of the above answers are "NO", then may proceed with Cephalosporin use.     Consultations:  None   Procedures/Studies:  2D echo Impressions: - Normal LV size with EF 50-55%, low normal to moderately decreased. Moderately dilated RV with moderately decreased systolic function. D-shaped inteventricular septum suggestive of RV pressure/volume overload. Mild pulmonary hypertension  Ct Angio Chest Pe W And/or Wo Contrast  Result Date: 11/04/2016 CLINICAL DATA:  Weakness and dyspnea since this morning. EXAM: CT ANGIOGRAPHY CHEST WITH CONTRAST TECHNIQUE: Multidetector CT imaging of the chest was performed using the standard protocol during bolus administration of intravenous contrast. Multiplanar CT image reconstructions and MIPs were obtained to evaluate the vascular anatomy. CONTRAST:  100 cc IV Isovue 370 COMPARISON:  Same day chest radiograph CT abdomen from 06/19/2015 FINDINGS: Cardiovascular: There is a large saddle embolus with extension into the right and left lobar and right  proximal segmental pulmonary arteries. RV/ LV ratio 1.62 consistent with right heart strain. The heart is enlarged. Aortic atherosclerosis without dissection. No pericardial effusion. Mediastinum/Nodes: Unremarkable esophagus and trachea. Substernal goiter. More No adenopathy. Lungs/Pleura: No pneumothorax. Minimal right and left lower lobe atelectasis. No pneumonic consolidation or dominant mass. Upper Abdomen: Stable pair of right hepatic lobe cysts the largest measuring 2.6 cm have remained stable. The gallbladder is physiologically distended. The visualized spleen and pancreas as well as adrenal glands are without acute appearing abnormalities. Musculoskeletal: No chest wall abnormality. No acute osseous abnormality. Multilevel mild degenerative disc disease of the visualized dorsal spine. Review of the MIP images confirms the above findings. IMPRESSION: Positive for acute PE with CT evidence of right heart strain (RV/LV Ratio = 1.6) consistent with at least submassive (intermediate risk) PE. The presence of right heart strain has been associated with an increased risk of morbidity and mortality. Saddle pulmonary embolus with extension into bilateral lower lobar and proximal right lower segmental pulmonary arteries. Critical Value/emergent results were called by telephone at the time of interpretation on 11/04/2016 at 7:29 pm to Dr. Merrily Pew , who verbally acknowledged these results. Electronically Signed   By:  Ashley Royalty M.D.   On: 11/04/2016 19:30   Dg Chest Port 1 View  Result Date: 11/08/2016 CLINICAL DATA:  75 year old female with fever. Subsequent encounter. EXAM: PORTABLE CHEST 1 VIEW COMPARISON:  11/04/2016, 11/25/2015 and 02/17/2008. FINDINGS: Sequential pacemaker enters from the left with leads in relatively similar position. Cardiomegaly. No infiltrate, congestive heart failure or pneumothorax. Calcified aorta No plain film evidence of pulmonary malignancy. IMPRESSION: No infiltrate or  congestive heart failure. Pacemaker in place with mild cardiomegaly. Aortic atherosclerosis. Electronically Signed   By: Genia Del M.D.   On: 11/08/2016 12:07   Dg Chest Portable 1 View  Result Date: 11/04/2016 CLINICAL DATA:  Tachycardia and shortness of breath EXAM: PORTABLE CHEST 1 VIEW COMPARISON:  November 25, 2015 FINDINGS: Mild cardiomegaly. Stable pacemaker. No pneumothorax. No pulmonary nodules, masses, or focal infiltrates. IMPRESSION: No active disease. Electronically Signed   By: Dorise Bullion III M.D   On: 11/04/2016 17:23     Subjective: - no chest pain, shortness of breath, no abdominal pain, nausea or vomiting.   Discharge Exam: Vitals:   11/09/16 2009 11/10/16 0556  BP: 132/62 (!) 122/50  Pulse: 89 88  Resp:  18  Temp: 98.3 F (36.8 C) 98.1 F (36.7 C)   Vitals:   11/09/16 0504 11/09/16 1351 11/09/16 2009 11/10/16 0556  BP: (!) 118/53 129/71 132/62 (!) 122/50  Pulse: 91 95 89 88  Resp: 18 18  18   Temp: 98.2 F (36.8 C) 98.4 F (36.9 C) 98.3 F (36.8 C) 98.1 F (36.7 C)  TempSrc: Oral Oral Oral Oral  SpO2: 94% 98% 97% 93%  Weight:      Height:        General: Pt is alert, awake, not in acute distress Cardiovascular: RRR, S1/S2 +, no rubs, no gallops Respiratory: CTA bilaterally, no wheezing, no rhonchi Abdominal: Soft, NT, ND, bowel sounds + Extremities: no edema, no cyanosis    The results of significant diagnostics from this hospitalization (including imaging, microbiology, ancillary and laboratory) are listed below for reference.     Microbiology: Recent Results (from the past 240 hour(s))  MRSA PCR Screening     Status: None   Collection Time: 11/05/16 12:08 AM  Result Value Ref Range Status   MRSA by PCR NEGATIVE NEGATIVE Final    Comment:        The GeneXpert MRSA Assay (FDA approved for NASAL specimens only), is one component of a comprehensive MRSA colonization surveillance program. It is not intended to diagnose  MRSA infection nor to guide or monitor treatment for MRSA infections.   Urine culture     Status: Abnormal   Collection Time: 11/08/16  4:05 PM  Result Value Ref Range Status   Specimen Description URINE, RANDOM  Final   Special Requests NONE  Final   Culture 80,000 COLONIES/mL PROTEUS MIRABILIS (A)  Final   Report Status 11/10/2016 FINAL  Final   Organism ID, Bacteria PROTEUS MIRABILIS (A)  Final      Susceptibility   Proteus mirabilis - MIC*    AMPICILLIN <=2 SENSITIVE Sensitive     CEFAZOLIN <=4 SENSITIVE Sensitive     CEFTRIAXONE <=1 SENSITIVE Sensitive     CIPROFLOXACIN <=0.25 SENSITIVE Sensitive     GENTAMICIN <=1 SENSITIVE Sensitive     IMIPENEM 2 SENSITIVE Sensitive     NITROFURANTOIN 128 RESISTANT Resistant     TRIMETH/SULFA <=20 SENSITIVE Sensitive     AMPICILLIN/SULBACTAM <=2 SENSITIVE Sensitive     PIP/TAZO <=4 SENSITIVE Sensitive     *  80,000 COLONIES/mL PROTEUS MIRABILIS     Labs: BNP (last 3 results)  Recent Labs  11/04/16 1633  BNP [id number]   Basic Metabolic Panel:  Recent Labs Lab 11/04/16 1633 11/06/16 0238 11/09/16 0339  NA 139 139 139  K 3.7 3.9 4.0  CL 104 106 104  CO2 26 28 28   GLUCOSE 225* 158* 108*  BUN 13 13 18   CREATININE 0.73 0.92 0.77  CALCIUM 9.3 8.7* 8.8*   Liver Function Tests:  Recent Labs Lab 11/04/16 1633  AST 17  ALT 14  ALKPHOS 77  BILITOT 0.9  PROT 7.5  ALBUMIN 3.6   No results for input(s): LIPASE, AMYLASE in the last 168 hours. No results for input(s): AMMONIA in the last 168 hours. CBC:  Recent Labs Lab 11/04/16 1633  11/06/16 0238 11/07/16 0236 11/08/16 0310 11/09/16 0339 11/10/16 0316  WBC 9.0  < > 8.2 8.6 9.9 8.7 7.5  NEUTROABS 6.7  --   --   --   --   --   --   HGB 12.3  < > 9.8* 10.9* 10.5* 10.5* 10.7*  HCT 35.6*  < > 29.1* 32.1* 30.7* 31.0* 31.0*  MCV 112.7*  < > 112.4* 111.1* 111.2* 111.5* 111.5*  PLT 184  < > 156 167 187 214 191  < > = values in this interval not displayed. Cardiac  Enzymes:  Recent Labs Lab 11/04/16 1633 11/04/16 2302 11/05/16 0327 11/05/16 0929 11/06/16 0238  TROPONINI 0.18* 0.50* 0.47* 0.26* 0.19*   BNP: Invalid input(s): POCBNP CBG:  Recent Labs Lab 11/09/16 0602 11/09/16 1121 11/09/16 1635 11/09/16 2256 11/10/16 0634  GLUCAP 104* 144* 124* 138* 132*   D-Dimer No results for input(s): DDIMER in the last 72 hours. Hgb A1c No results for input(s): HGBA1C in the last 72 hours. Lipid Profile No results for input(s): CHOL, HDL, LDLCALC, TRIG, CHOLHDL, LDLDIRECT in the last 72 hours. Thyroid function studies No results for input(s): TSH, T4TOTAL, T3FREE, THYROIDAB in the last 72 hours.  Invalid input(s): FREET3 Anemia work up No results for input(s): VITAMINB12, FOLATE, FERRITIN, TIBC, IRON, RETICCTPCT in the last 72 hours. Urinalysis    Component Value Date/Time   COLORURINE AMBER (A) 11/08/2016 1605   APPEARANCEUR CLOUDY (A) 11/08/2016 1605   LABSPEC 1.026 11/08/2016 1605   PHURINE 6.0 11/08/2016 1605   GLUCOSEU NEGATIVE 11/08/2016 1605   HGBUR TRACE (A) 11/08/2016 1605   BILIRUBINUR NEGATIVE 11/08/2016 1605   KETONESUR NEGATIVE 11/08/2016 1605   PROTEINUR NEGATIVE 11/08/2016 1605   UROBILINOGEN 0.2 06/18/2015 2300   NITRITE NEGATIVE 11/08/2016 1605   LEUKOCYTESUR TRACE (A) 11/08/2016 1605   Sepsis Labs Invalid input(s): PROCALCITONIN,  WBC,  LACTICIDVEN Microbiology Recent Results (from the past 240 hour(s))  MRSA PCR Screening     Status: None   Collection Time: 11/05/16 12:08 AM  Result Value Ref Range Status   MRSA by PCR NEGATIVE NEGATIVE Final    Comment:        The GeneXpert MRSA Assay (FDA approved for NASAL specimens only), is one component of a comprehensive MRSA colonization surveillance program. It is not intended to diagnose MRSA infection nor to guide or monitor treatment for MRSA infections.   Urine culture     Status: Abnormal   Collection Time: 11/08/16  4:05 PM  Result Value Ref Range  Status   Specimen Description URINE, RANDOM  Final   Special Requests NONE  Final   Culture 80,000 COLONIES/mL PROTEUS MIRABILIS (A)  Final   Report Status  11/10/2016 FINAL  Final   Organism ID, Bacteria PROTEUS MIRABILIS (A)  Final      Susceptibility   Proteus mirabilis - MIC*    AMPICILLIN <=2 SENSITIVE Sensitive     CEFAZOLIN <=4 SENSITIVE Sensitive     CEFTRIAXONE <=1 SENSITIVE Sensitive     CIPROFLOXACIN <=0.25 SENSITIVE Sensitive     GENTAMICIN <=1 SENSITIVE Sensitive     IMIPENEM 2 SENSITIVE Sensitive     NITROFURANTOIN 128 RESISTANT Resistant     TRIMETH/SULFA <=20 SENSITIVE Sensitive     AMPICILLIN/SULBACTAM <=2 SENSITIVE Sensitive     PIP/TAZO <=4 SENSITIVE Sensitive     * 80,000 COLONIES/mL PROTEUS MIRABILIS     Time coordinating discharge: Over 30 minutes  SIGNED:  Marzetta Board, MD  [facility] 11/10/2016, 10:26 AM Pager [phone]  If 7PM-7AM, please contact night-coverage [email or web address] Password [email or web address]

## 2016-11-10 NOTE — Progress Notes (Signed)
Order to discharge received, IV and tele removed.  CCMD notified

## 2016-11-10 NOTE — Progress Notes (Signed)
[facility] here to transport.  Report called, given to [facility] at [facility] [phone]).

## 2016-11-10 NOTE — Clinical Social Work Note (Signed)
Clinical Social Worker facilitated patient discharge including contacting patient family and facility to confirm patient discharge plans.  Clinical information faxed to facility and family agreeable with plan.  CSW arranged ambulance transport via [facility] to [facility].  RN to call report prior to discharge.  Clinical Social Worker will sign off for now as social work intervention is no longer needed. Please consult us again if new need arises.  Jesse Henri Guedes, LCSW [phone] 

## 2016-11-11 ENCOUNTER — Non-Acute Institutional Stay (SKILLED_NURSING_FACILITY): Payer: Medicare Other | Admitting: Internal Medicine

## 2016-11-11 ENCOUNTER — Encounter: Payer: Self-pay | Admitting: Internal Medicine

## 2016-11-11 ENCOUNTER — Other Ambulatory Visit: Payer: Self-pay

## 2016-11-11 DIAGNOSIS — I2692 Saddle embolus of pulmonary artery without acute cor pulmonale: Secondary | ICD-10-CM | POA: Diagnosis not present

## 2016-11-11 DIAGNOSIS — I10 Essential (primary) hypertension: Secondary | ICD-10-CM | POA: Diagnosis not present

## 2016-11-11 DIAGNOSIS — E119 Type 2 diabetes mellitus without complications: Secondary | ICD-10-CM | POA: Diagnosis not present

## 2016-11-11 DIAGNOSIS — Z794 Long term (current) use of insulin: Secondary | ICD-10-CM | POA: Diagnosis not present

## 2016-11-11 DIAGNOSIS — N390 Urinary tract infection, site not specified: Secondary | ICD-10-CM

## 2016-11-11 MED ORDER — OXYCODONE HCL 5 MG PO TABS: ORAL_TABLET | ORAL | 0 refills | Status: DC

## 2016-11-11 NOTE — Progress Notes (Signed)
Location:    [facility] Room Number: 157/D Place of Service:  SNF (31) Provider:  Glennon Hamilton, MD  Patient Care Team: [name], MD as PCP - General (Internal Medicine)  Extended Emergency Contact Information Primary Emergency Contact: Lyla Son [place] of [place] Work Phone: [phone] Relation: Daughter Secondary Emergency Contact: Gerre Couch [place] Mobile Phone: [phone] Relation: Daughter  Code Status:  Full Code Goals of care: Advanced Directive information Advanced Directives 11/11/2016  Does patient have an advance directive? Yes  Type of Advance Directive (No Data)  Does patient want to make changes to advanced directive? No - Patient declined  Copy of advanced directive(s) in chart? Yes  Would patient like information on creating an advanced directive? -     Acute visit follow-up pulmonary embolism-requiring recent hospitalization  HPI:  Pt is a 75 y.o. female seen today for an acute visit for recent pulmonary embolism that required hospitalization.  Apparently before hospitalization she been having increased shortness of breath on exertion chest pain.  This worsened acutely and she went to the ER where workup revealed acute saddle pulmonary embolus as well as a left lower extremity DVT-she was treated initially with heparin-she has been discharged on Eliq  She also was found to have a fever with the UTI Proteus that was pansensitive she is finishing a course of ciprofloxacin.  Her L her medical issues including hypertension diabetes apparently were stable during her stay.  Currently she is lying in bed comfortably does not complain of any chest pain or shortness of breath vital signs appear stable-she is quite motivated to get back home    Past Medical History:  Diagnosis Date  . Acid reflux   . Arthritis   . Complete heart block (HCC)    STJ PPM Dr. [name] 11/24/15  .  Essential hypertension   . Hyperlipidemia   . Type 2 diabetes mellitus ([place])    Past Surgical History:  Procedure Laterality Date  . EP IMPLANTABLE DEVICE N/A 11/24/2015   Procedure: Pacemaker Implant;  Surgeon: [name], MD;  Location: [facility];  Service: Cardiovascular;  Laterality: N/A;  . JOINT REPLACEMENT     knees bilat.  . TUBAL LIGATION      Allergies  Allergen Reactions  . Penicillins Hives and Itching    Has patient had a PCN reaction causing immediate rash, facial/tongue/throat swelling, SOB or lightheadedness with hypotension: No Has patient had a PCN reaction causing severe rash involving mucus membranes or skin necrosis: Yes Has patient had a PCN reaction that required hospitalization No Has patient had a PCN reaction occurring within the last 10 years: No If all of the above answers are "NO", then may proceed with Cephalosporin use.       Medication List       Accurate as of 11/11/16  9:49 AM. Always use your most recent med list.          apixaban 5 MG Tabs tablet Commonly known as:  ELIQUIS Take 2 tablets (10 mg total) by mouth 2 (two) times daily. For 4 more days, on Tuesday, 11/15/16, change dose to 5 mg twice daily   atorvastatin 40 MG tablet Commonly known as:  LIPITOR Take 40 mg by mouth at bedtime.   ciprofloxacin 500 MG tablet Commonly known as:  CIPRO Take 1 tablet (500 mg total) by mouth 2 (two) times daily. For 3 days   hydrALAZINE 25 MG tablet Commonly known  as:  APRESOLINE Take 25 mg by mouth 2 (two) times daily.   LANTUS SOLOSTAR 100 UNIT/ML Solostar Pen Generic drug:  Insulin Glargine Inject 30 Units into the skin at bedtime.   losartan 100 MG tablet Commonly known as:  COZAAR Take 100 mg by mouth every morning.   metFORMIN 1000 MG tablet Commonly known as:  GLUCOPHAGE Take 1,000 mg by mouth 2 (two) times daily with a meal.   NIFEdipine 90 MG 24 hr tablet Commonly known as:  PROCARDIA XL/ADALAT-CC Take 90 mg  by mouth every morning.   oxyCODONE 5 MG immediate release tablet Commonly known as:  Oxy IR/ROXICODONE Take 1 tablet (5 mg total) by mouth every 4 (four) hours as needed for moderate pain.   ranitidine 300 MG tablet Commonly known as:  ZANTAC Take 300 mg by mouth every morning.       Review of Systems  General no complaints of fever or chills currently.  Skin does not complain of any rashes or itching.  Head ears eyes nose mouth and throat is not complaining of a visual changes or sore throat.  Respiratory is not complaining of shortness breath or cough.  Cardiac denies chest pain area  GI does not complain of nausea vomiting diarrhea constipation or abdominal discomfort.  GU is completing a course of Cipro for Proteus UTI does not complaining of dysuria currently..  Musculoskeletal is not complaining of joint pain does have some debility status post hospitalization -- history of obesity   Neurologic does not complain of headache dizziness numbness or syncope.  Psych does not complain of overt depression or anxiety.     There is no immunization history on file for this patient. Pertinent  Health Maintenance Due  Topic Date Due  . FOOT EXAM  03/05/1951  . OPHTHALMOLOGY EXAM  03/05/1951  . COLONOSCOPY  03/05/1992  . DEXA SCAN  03/06/2007  . PNA vac Low Risk Adult (1 of 2 - PCV13) 03/06/2007  . HEMOGLOBIN A1C  05/22/2016  . INFLUENZA VACCINE  07/26/2016  . MAMMOGRAM  05/19/2018   No flowsheet data found. Functional Status Survey:    Vitals:   11/11/16 0846  BP: 134/78  Pulse: 84  Resp: 20  Temp: 97.7 F (36.5 C)  TempSrc: Oral    Physical Exam   In general this is a pleasant obese elderly female in no distress resting comfortably in bed.  Her skin is warm and dry.  Eyes pupils appear reactive light sclera and conjunctiva are clear visual acuity appears intact.  Oropharynx is clear mucous membranes moist.  Heart is regular rate and rhythm without  murmur gallop or rub she does not really have significant lower extremity edema.  Abdomen is obese soft nontender with positive bowel sounds.  GU could not really appreciate significant suprapubic tenderness.  Musculoskeletal is able to move all extremities 4 with appropriate strength.  Neurologic is grossly intact her speech is clear no lateralizing findings.  Psych she is alert and oriented pleasant and appropriate   Labs reviewed:  Recent Labs  11/23/15 1559  11/24/15 0520  11/04/16 1633 11/06/16 0238 11/09/16 0339  NA 142  < > 142  < > 139 139 139  K 3.5  < > 4.1  < > 3.7 3.9 4.0  CL 112*  < > 112*  < > 104 106 104  CO2 21*  --  21*  < > 26 28 28   GLUCOSE 122*  < > 162*  < > 225* 158*  108*  BUN 23*  < > 16  < > 13 13 18   CREATININE 0.87  < > 0.80  < > 0.73 0.92 0.77  CALCIUM 8.5*  --  8.5*  < > 9.3 8.7* 8.8*  MG 1.2*  --  2.2  --   --   --   --   < > = values in this interval not displayed.  Recent Labs  11/04/16 1633  AST 17  ALT 14  ALKPHOS 77  BILITOT 0.9  PROT 7.5  ALBUMIN 3.6    Recent Labs  11/23/15 1559  11/04/16 1633  11/08/16 0310 11/09/16 0339 11/10/16 0316  WBC 9.1  < > 9.0  < > 9.9 8.7 7.5  NEUTROABS 4.9  --  6.7  --   --   --   --   HGB 11.1*  < > 12.3  < > 10.5* 10.5* 10.7*  HCT 33.9*  < > 35.6*  < > 30.7* 31.0* 31.0*  MCV 89.2  < > 112.7*  < > 111.2* 111.5* 111.5*  PLT 234  < > 184  < > 187 214 191  < > = values in this interval not displayed. Lab Results  Component Value Date   TSH 0.713 11/23/2015   Lab Results  Component Value Date   HGBA1C 7.6 (H) 11/23/2015   No results found for: CHOL, HDL, LDLCALC, LDLDIRECT, TRIG, CHOLHDL  Significant Diagnostic Results in last 30 days:  Ct Angio Chest Pe W And/or Wo Contrast  Result Date: 11/04/2016 CLINICAL DATA:  Weakness and dyspnea since this morning. EXAM: CT ANGIOGRAPHY CHEST WITH CONTRAST TECHNIQUE: Multidetector CT imaging of the chest was performed using the standard protocol  during bolus administration of intravenous contrast. Multiplanar CT image reconstructions and MIPs were obtained to evaluate the vascular anatomy. CONTRAST:  100 cc IV Isovue 370 COMPARISON:  Same day chest radiograph CT abdomen from 06/19/2015 FINDINGS: Cardiovascular: There is a large saddle embolus with extension into the right and left lobar and right proximal segmental pulmonary arteries. RV/ LV ratio 1.62 consistent with right heart strain. The heart is enlarged. Aortic atherosclerosis without dissection. No pericardial effusion. Mediastinum/Nodes: Unremarkable esophagus and trachea. Substernal goiter. More No adenopathy. Lungs/Pleura: No pneumothorax. Minimal right and left lower lobe atelectasis. No pneumonic consolidation or dominant mass. Upper Abdomen: Stable pair of right hepatic lobe cysts the largest measuring 2.6 cm have remained stable. The gallbladder is physiologically distended. The visualized spleen and pancreas as well as adrenal glands are without acute appearing abnormalities. Musculoskeletal: No chest wall abnormality. No acute osseous abnormality. Multilevel mild degenerative disc disease of the visualized dorsal spine. Review of the MIP images confirms the above findings. IMPRESSION: Positive for acute PE with CT evidence of right heart strain (RV/LV Ratio = 1.6) consistent with at least submassive (intermediate risk) PE. The presence of right heart strain has been associated with an increased risk of morbidity and mortality. Saddle pulmonary embolus with extension into bilateral lower lobar and proximal right lower segmental pulmonary arteries. Critical Value/emergent results were called by telephone at the time of interpretation on 11/04/2016 at 7:29 pm to Dr. Merrily Pew , who verbally acknowledged these results. Electronically Signed   By: Ashley Royalty M.D.   On: 11/04/2016 19:30   Dg Chest Port 1 View  Result Date: 11/08/2016 CLINICAL DATA:  75 year old female with fever.  Subsequent encounter. EXAM: PORTABLE CHEST 1 VIEW COMPARISON:  11/04/2016, 11/25/2015 and 02/17/2008. FINDINGS: Sequential pacemaker enters from the left with leads  in relatively similar position. Cardiomegaly. No infiltrate, congestive heart failure or pneumothorax. Calcified aorta No plain film evidence of pulmonary malignancy. IMPRESSION: No infiltrate or congestive heart failure. Pacemaker in place with mild cardiomegaly. Aortic atherosclerosis. Electronically Signed   By: Genia Del M.D.   On: 11/08/2016 12:07   Dg Chest Portable 1 View  Result Date: 11/04/2016 CLINICAL DATA:  Tachycardia and shortness of breath EXAM: PORTABLE CHEST 1 VIEW COMPARISON:  November 25, 2015 FINDINGS: Mild cardiomegaly. Stable pacemaker. No pneumothorax. No pulmonary nodules, masses, or focal infiltrates. IMPRESSION: No active disease. Electronically Signed   By: Dorise Bullion III M.D   On: 11/04/2016 17:23    Assessment/Plan #1-.history of saddle pulmonary embolus with left leg DVT-again she is on   Eliquist--10 mg twice a day until November 21 at which time it will be reduced to 5 mg twice a day recommendation to continue anticoagulation for 1 year-clinically she appears stable no complaints of chest pain or shortness of breath.   #2 history of hypertension at this point appears to be stable but will continue to monitor readings she is on hydralazine 25 mg twice a day Cozaar 100 mg daily-and nifedipine 90 mg a day.  #3 history UTI positive for Proteus she is on Cipro completing a course of this does not complain of dysuria at this time.  #4-history of GERD continues on Zantac.  #5 history diabetes type 2 she is on Lantus 30 units daily as well as Glucophage thousand milligrams twice a day at this point will monitor CBGs.  #6-history of hyperlipidemia she continues atorvastatin 40 mg daily at bedtime--  #7-history of heart block she does have history of pacemaker heart rate appears to be controlled this  apparently has been stable and was not an issue either in the hospital.  Will update lab work including a CBC BMP next week to ensure stability.  [id number] note greater than 35 minutes spent assessing patient-reviewing her chart-reviewing her labs-and coordinating and formulating a plan of care for numerous diagnoses-of note greater than 50% of time spent coordinating plan of care        Oralia Manis, [place]

## 2016-11-11 NOTE — Telephone Encounter (Signed)
Rx request [employer]

## 2016-11-16 ENCOUNTER — Encounter (HOSPITAL_COMMUNITY)
Admission: RE | Admit: 2016-11-16 | Discharge: 2016-11-16 | Disposition: A | Discharge status: Home / Self Care | Payer: Medicare Other | Source: Skilled Nursing Facility | Attending: Internal Medicine | Admitting: Internal Medicine

## 2016-11-16 DIAGNOSIS — Z794 Long term (current) use of insulin: Secondary | ICD-10-CM | POA: Insufficient documentation

## 2016-11-16 DIAGNOSIS — Z7901 Long term (current) use of anticoagulants: Secondary | ICD-10-CM | POA: Insufficient documentation

## 2016-11-16 DIAGNOSIS — Z5189 Encounter for other specified aftercare: Secondary | ICD-10-CM | POA: Insufficient documentation

## 2016-11-16 DIAGNOSIS — I2692 Saddle embolus of pulmonary artery without acute cor pulmonale: Secondary | ICD-10-CM | POA: Insufficient documentation

## 2016-11-16 DIAGNOSIS — I82402 Acute embolism and thrombosis of unspecified deep veins of left lower extremity: Secondary | ICD-10-CM | POA: Insufficient documentation

## 2016-11-16 LAB — BASIC METABOLIC PANEL
Anion gap: 5 (ref 5–15)
BUN: 17 mg/dL (ref 6–20)
CO2: 27 mmol/L (ref 22–32)
CREATININE: 0.77 mg/dL (ref 0.44–1.00)
Calcium: 8.9 mg/dL (ref 8.9–10.3)
Chloride: 105 mmol/L (ref 101–111)
GFR calc Af Amer: >60 mL/min (ref >60)
GLUCOSE: 87 mg/dL (ref 65–99)
Potassium: 4.2 mmol/L (ref 3.5–5.1)
SODIUM: 137 mmol/L (ref 135–145)

## 2016-11-16 LAB — CBC
HCT: 32.1 % — ABNORMAL LOW (ref 36.0–46.0)
Hemoglobin: 10.6 g/dL — ABNORMAL LOW (ref 12.0–15.0)
MCH: 36.9 pg — AB (ref 26.0–34.0)
MCHC: 33 g/dL (ref 30.0–36.0)
MCV: 111.8 fL — AB (ref 78.0–100.0)
PLATELETS: 336 10*3/uL (ref 150–400)
RBC: 2.87 MIL/uL — ABNORMAL LOW (ref 3.87–5.11)
RDW: 14.1 % (ref 11.5–15.5)
WBC: 7.4 10*3/uL (ref 4.0–10.5)

## 2016-11-17 ENCOUNTER — Encounter (HOSPITAL_COMMUNITY)
Admission: RE | Admit: 2016-11-17 | Discharge: 2016-11-17 | Disposition: A | Discharge status: Home / Self Care | Payer: Medicare Other | Source: Skilled Nursing Facility | Attending: *Deleted | Admitting: *Deleted

## 2016-11-17 LAB — BASIC METABOLIC PANEL
ANION GAP: 8 (ref 5–15)
BUN: 17 mg/dL (ref 6–20)
CALCIUM: 8.7 mg/dL — AB (ref 8.9–10.3)
CO2: 26 mmol/L (ref 22–32)
Chloride: 104 mmol/L (ref 101–111)
Creatinine, Ser: 0.75 mg/dL (ref 0.44–1.00)
GFR calc Af Amer: >60 mL/min (ref >60)
GLUCOSE: 121 mg/dL — AB (ref 65–99)
Potassium: 4.1 mmol/L (ref 3.5–5.1)
SODIUM: 138 mmol/L (ref 135–145)

## 2016-11-17 LAB — CBC
HCT: 32.2 % — ABNORMAL LOW (ref 36.0–46.0)
HEMOGLOBIN: 10.7 g/dL — AB (ref 12.0–15.0)
MCH: 36.9 pg — ABNORMAL HIGH (ref 26.0–34.0)
MCHC: 33.2 g/dL (ref 30.0–36.0)
MCV: 111 fL — ABNORMAL HIGH (ref 78.0–100.0)
Platelets: 352 10*3/uL (ref 150–400)
RBC: 2.9 MIL/uL — ABNORMAL LOW (ref 3.87–5.11)
RDW: 14.5 % (ref 11.5–15.5)
WBC: 8.3 10*3/uL (ref 4.0–10.5)

## 2016-11-18 ENCOUNTER — Encounter (HOSPITAL_COMMUNITY)
Admission: AD | Admit: 2016-11-18 | Discharge: 2016-11-18 | Disposition: A | Discharge status: Home / Self Care | Payer: Medicare Other | Source: Skilled Nursing Facility | Attending: Internal Medicine | Admitting: Internal Medicine

## 2016-11-21 ENCOUNTER — Non-Acute Institutional Stay (SKILLED_NURSING_FACILITY): Payer: Medicare Other | Admitting: Internal Medicine

## 2016-11-21 ENCOUNTER — Encounter: Payer: Self-pay | Admitting: Internal Medicine

## 2016-11-21 DIAGNOSIS — R42 Dizziness and giddiness: Secondary | ICD-10-CM | POA: Diagnosis not present

## 2016-11-21 DIAGNOSIS — I10 Essential (primary) hypertension: Secondary | ICD-10-CM | POA: Diagnosis not present

## 2016-11-21 DIAGNOSIS — D649 Anemia, unspecified: Secondary | ICD-10-CM | POA: Diagnosis not present

## 2016-11-21 DIAGNOSIS — Z794 Long term (current) use of insulin: Secondary | ICD-10-CM | POA: Diagnosis not present

## 2016-11-21 DIAGNOSIS — E119 Type 2 diabetes mellitus without complications: Secondary | ICD-10-CM

## 2016-11-21 DIAGNOSIS — I2692 Saddle embolus of pulmonary artery without acute cor pulmonale: Secondary | ICD-10-CM | POA: Diagnosis not present

## 2016-11-21 NOTE — Progress Notes (Signed)
Provider:Datrell Dunton,Tayleigh Wetherell   Location:   [facility] Room Number: 157/D Place of Service:  SNF (31)  PCP: [name], MD Patient Care Team: [name], MD as PCP - General (Internal Medicine)  Extended Emergency Contact Information Primary Emergency Contact: [place] Work Phone: [phone] Relation: Daughter Secondary Emergency Contact: Gerre Couch [place] of [place] Mobile Phone: [phone] Relation: Daughter  Code Status: Full Code  Goals of Care: Advanced Directive information Advanced Directives 11/21/2016  Does Patient Have a Medical Advance Directive? Yes  Type of Advance Directive (No Data)  Does patient want to make changes to medical advance directive? No - Patient declined  Copy of [place] in Chart? -  Would patient like information on creating a medical advance directive? No - Patient declined      Chief Complaint  Patient presents with  . New Admit To SNF    HPI: Patient is a 75 y.o. female seen today for admission to SNF for therapy.  Patient has h/o Hypertension , Diabetes, Hyperlipidemia and Completes heart block S/P Permanent Pacemaker Insertion in 11/16. She presented to ED with Chest pain and SOB. The Chest CT scan showed Saddle Pulmonary Embolism. She was also found to have Left LE DVT. She was started on Eliquis. Her TTE showed Normal LV size with EF 50-55% Moderately dilated RV with moderately decreased systolic function. She was also treated for UTI.  Patient is doing well in SNF . But she says that she gets dizzy if she stands for long time. This has been going on for last few months. She was very independent before this illness. Lives with her husband. Was walking with [employer]. Has lot of family and help around her.  Past Medical History:  Diagnosis Date  . Acid reflux   . Arthritis   . Complete heart block (HCC)    STJ PPM Dr. [name] 11/24/15  . Essential  hypertension   . Hyperlipidemia   . Type 2 diabetes mellitus ([place])    Past Surgical History:  Procedure Laterality Date  . EP IMPLANTABLE DEVICE N/A 11/24/2015   Procedure: Pacemaker Implant;  Surgeon: [name], MD;  Location: [facility];  Service: Cardiovascular;  Laterality: N/A;  . JOINT REPLACEMENT     knees bilat.  . TUBAL LIGATION      reports that she has never smoked. She has never used smokeless tobacco. She reports that she does not drink alcohol or use drugs. Social History   Social History  . Marital status: Married    Spouse name: N/A  . Number of children: N/A  . Years of education: N/A   Occupational History  . Not on file.   Social History Main Topics  . Smoking status: Never Smoker  . Smokeless tobacco: Never Used  . Alcohol use No  . Drug use: No  . Sexual activity: Yes    Birth control/ protection: Post-menopausal   Other Topics Concern  . Not on file   Social History Narrative  . No narrative on file    Functional Status Survey:    Family History  Problem Relation Age of Onset  . Heart failure Mother   . Hypertension Mother   . Heart failure Sister   . Hypertension Father     Health Maintenance  Topic Date Due  . FOOT EXAM  03/05/1951  . OPHTHALMOLOGY EXAM  03/05/1951  . URINE MICROALBUMIN  03[birth year]05/1952  . TETANUS/TDAP  03/05/1961  .  COLONOSCOPY  03/05/1992  . ZOSTAVAX  03/05/2002  . DEXA SCAN  03/06/2007  . PNA vac Low Risk Adult (1 of 2 - PCV13) 03/06/2007  . HEMOGLOBIN A1C  05/22/2016  . INFLUENZA VACCINE  07/26/2016  . MAMMOGRAM  05/19/2018    Allergies  Allergen Reactions  . Penicillins Hives and Itching    Has patient had a PCN reaction causing immediate rash, facial/tongue/throat swelling, SOB or lightheadedness with hypotension: No Has patient had a PCN reaction causing severe rash involving mucus membranes or skin necrosis: Yes Has patient had a PCN reaction that required hospitalization No Has patient had a  PCN reaction occurring within the last 10 years: No If all of the above answers are "NO", then may proceed with Cephalosporin use.     Current Outpatient Prescriptions on File Prior to Visit  Medication Sig Dispense Refill  . atorvastatin (LIPITOR) 40 MG tablet Take 40 mg by mouth at bedtime.     . hydrALAZINE (APRESOLINE) 25 MG tablet Take 25 mg by mouth 2 (two) times daily.    Marland Kitchen LANTUS SOLOSTAR 100 UNIT/ML Solostar Pen Inject 30 Units into the skin at bedtime.     Marland Kitchen losartan (COZAAR) 100 MG tablet Take 100 mg by mouth every morning.     . metFORMIN (GLUCOPHAGE) 1000 MG tablet Take 1,000 mg by mouth 2 (two) times daily with a meal.    . NIFEdipine (PROCARDIA XL/ADALAT-CC) 90 MG 24 hr tablet Take 90 mg by mouth every morning.     Marland Kitchen oxyCODONE (OXY IR/ROXICODONE) 5 MG immediate release tablet Take one tablet by mouth every 4 hours as needed for pain 180 tablet 0  . ranitidine (ZANTAC) 300 MG tablet Take 300 mg by mouth every morning.     Also On Eliquis 5 mg BID No current facility-administered medications on file prior to visit.     Review of Systems  Constitutional: Negative for activity change, appetite change, chills, diaphoresis, fatigue and fever.  HENT: Negative for postnasal drip, rhinorrhea, sneezing and sore throat.   Respiratory: Negative for apnea, cough, choking, chest tightness, shortness of breath and stridor.   Cardiovascular: Positive for leg swelling. Negative for chest pain and palpitations.  Gastrointestinal: Negative for abdominal distention, abdominal pain, anal bleeding, blood in stool, constipation, diarrhea and nausea.  Genitourinary: Negative for difficulty urinating, dyspareunia, dysuria, flank pain and frequency.  Musculoskeletal: Negative for arthralgias, back pain, gait problem, joint swelling and myalgias.  Neurological: Positive for dizziness, weakness and light-headedness. Negative for tremors, seizures, syncope, speech difficulty and numbness.    Psychiatric/Behavioral: Negative for behavioral problems, confusion, decreased concentration, dysphoric mood and hallucinations.    Vitals:   11/21/16 1040  BP: (!) 148/75  Pulse: 90  Resp: 20  Temp: 99.1 F (37.3 C)  TempSrc: Oral   There is no height or weight on file to calculate BMI. Physical Exam  Constitutional: She is oriented to person, place, and time. She appears well-developed and well-nourished.  HENT:  Head: Normocephalic.  Mouth/Throat: Oropharynx is clear and moist.  Cardiovascular: Normal rate, regular rhythm and normal heart sounds.   No murmur heard. Pulmonary/Chest: Effort normal and breath sounds normal. No respiratory distress. She has no wheezes. She has no rales.  Abdominal: Soft. Bowel sounds are normal. She exhibits no distension and no mass. There is no tenderness. There is no rebound and no guarding.  Musculoskeletal: She exhibits edema.  Bilateral Edema. Left more then right.  Neurological: She is alert and oriented to person, place,  and time.  Good strength in all extremities.    Labs reviewed: Basic Metabolic Panel:  Recent Labs  11/23/15 1559  11/24/15 0520  11/09/16 0339 11/16/16 0500 11/17/16 0820  NA 142  < > 142  < > 139 137 138  K 3.5  < > 4.1  < > 4.0 4.2 4.1  CL 112*  < > 112*  < > 104 105 104  CO2 21*  --  21*  < > 28 27 26   GLUCOSE 122*  < > 162*  < > 108* 87 121*  BUN 23*  < > 16  < > 18 17 17   CREATININE 0.87  < > 0.80  < > 0.77 0.77 0.75  CALCIUM 8.5*  --  8.5*  < > 8.8* 8.9 8.7*  MG 1.2*  --  2.2  --   --   --   --   < > = values in this interval not displayed. Liver Function Tests:  Recent Labs  11/04/16 1633  AST 17  ALT 14  ALKPHOS 77  BILITOT 0.9  PROT 7.5  ALBUMIN 3.6   No results for input(s): LIPASE, AMYLASE in the last 8760 hours. No results for input(s): AMMONIA in the last 8760 hours. CBC:  Recent Labs  11/23/15 1559  11/04/16 1633  11/10/16 0316 11/16/16 0500 11/17/16 0820  WBC 9.1  < > 9.0   < > 7.5 7.4 8.3  NEUTROABS 4.9  --  6.7  --   --   --   --   HGB 11.1*  < > 12.3  < > 10.7* 10.6* 10.7*  HCT 33.9*  < > 35.6*  < > 31.0* 32.1* 32.2*  MCV 89.2  < > 112.7*  < > 111.5* 111.8* 111.0*  PLT 234  < > 184  < > 191 336 352  < > = values in this interval not displayed. Cardiac Enzymes:  Recent Labs  11/05/16 0327 11/05/16 0929 11/06/16 0238  TROPONINI 0.47* 0.26* 0.19*   BNP: Invalid input(s): POCBNP Lab Results  Component Value Date   HGBA1C 7.6 (H) 11/23/2015   Lab Results  Component Value Date   TSH 0.713 11/23/2015   No results found for: VITAMINB12 No results found for: FOLATE No results found for: IRON, TIBC, FERRITIN  Imaging and Procedures obtained prior to SNF admission: No results found.  Assessment/Plan  Acute saddle pulmonary embolism   Patient is on Eliquis. Plan is to continue Eliquis for 1 year and then needs revaluation before discontinuing. Her sedentary life style was thought to be reason for her LE clots leading to PE .No further investigation required right now.   Controlled type 2 diabetes mellitus Patient BS in facility are running b/w 100-200. Continue same dose of insulin and Metformin. Continue Accu check. Will Follow A1C.  Dizziness  Patient is on multiple BP meds. Will decrease the dose of Nifedipine to 30mg  Continue to follow BP. Slightly increased today but has been low in the hospital and facility before. Also check orthostatics.  Essential hypertension Patient is on losartan and Hydralazine. Will decreased the dose of Nifedipine and continue to follow BP  Anemia HGB  Has been stable but Low. MCV is elevated. Will check Iron studies and Vit B12.  UTI  Just finished Cipro. Patient stays asymptomatic. Hyperlipidemia Continue Atrovasatatin.   Family/ staff Communication:   Labs/tests ordered:

## 2016-11-22 ENCOUNTER — Encounter (HOSPITAL_COMMUNITY)
Admission: AD | Admit: 2016-11-22 | Discharge: 2016-11-22 | Disposition: A | Discharge status: Home / Self Care | Payer: Medicare Other | Source: Skilled Nursing Facility | Attending: Internal Medicine | Admitting: Internal Medicine

## 2016-11-22 DIAGNOSIS — Z794 Long term (current) use of insulin: Secondary | ICD-10-CM | POA: Insufficient documentation

## 2016-11-22 DIAGNOSIS — Z5189 Encounter for other specified aftercare: Secondary | ICD-10-CM | POA: Insufficient documentation

## 2016-11-22 DIAGNOSIS — I2692 Saddle embolus of pulmonary artery without acute cor pulmonale: Secondary | ICD-10-CM | POA: Insufficient documentation

## 2016-11-22 DIAGNOSIS — Z7901 Long term (current) use of anticoagulants: Secondary | ICD-10-CM | POA: Insufficient documentation

## 2016-11-22 DIAGNOSIS — I10 Essential (primary) hypertension: Secondary | ICD-10-CM | POA: Insufficient documentation

## 2016-11-22 LAB — FERRITIN: FERRITIN: 75 ng/mL (ref 11–307)

## 2016-11-22 LAB — IRON AND TIBC
IRON: 50 ug/dL (ref 28–170)
Saturation Ratios: 22 % (ref 10.4–31.8)
TIBC: 230 ug/dL — ABNORMAL LOW (ref 250–450)
UIBC: 180 ug/dL

## 2016-11-22 LAB — VITAMIN B12

## 2016-11-22 LAB — FOLATE: FOLATE: 9.2 ng/mL (ref >5.9)

## 2016-11-23 LAB — FOLATE RBC
FOLATE, HEMOLYSATE: 226.1 ng/mL
Folate, RBC: 621 ng/mL (ref >498)
Hematocrit: 36.4 % (ref 34.0–46.6)

## 2016-11-23 LAB — HEMOGLOBIN A1C
Hgb A1c MFr Bld: 7.4 % — ABNORMAL HIGH (ref 4.8–5.6)
MEAN PLASMA GLUCOSE: 166 mg/dL

## 2016-11-28 ENCOUNTER — Non-Acute Institutional Stay (SKILLED_NURSING_FACILITY): Payer: Medicare Other | Admitting: Internal Medicine

## 2016-11-28 ENCOUNTER — Encounter: Payer: Self-pay | Admitting: Internal Medicine

## 2016-11-28 DIAGNOSIS — I10 Essential (primary) hypertension: Secondary | ICD-10-CM | POA: Diagnosis not present

## 2016-11-28 DIAGNOSIS — K219 Gastro-esophageal reflux disease without esophagitis: Secondary | ICD-10-CM | POA: Diagnosis not present

## 2016-11-28 DIAGNOSIS — I442 Atrioventricular block, complete: Secondary | ICD-10-CM | POA: Diagnosis not present

## 2016-11-28 DIAGNOSIS — I2692 Saddle embolus of pulmonary artery without acute cor pulmonale: Secondary | ICD-10-CM

## 2016-11-28 DIAGNOSIS — Z794 Long term (current) use of insulin: Secondary | ICD-10-CM | POA: Diagnosis not present

## 2016-11-28 DIAGNOSIS — E119 Type 2 diabetes mellitus without complications: Secondary | ICD-10-CM

## 2016-11-28 NOTE — Progress Notes (Signed)
Location:   [facility] of Service:   SNF room 157/D  Provider: Veleta Miners  PCP: [name], MD Patient Care Team: [name], MD as PCP - General (Internal Medicine)  Extended Emergency Contact Information Primary Emergency Contact: Lyla Son [place] Work Phone: [phone] Relation: Daughter Secondary Emergency Contact: Gerre Couch [place] Mobile Phone: [phone] Relation: Daughter  Code Status: Full Code Goals of care:  Advanced Directive information Advanced Directives 11/28/2016  Does Patient Have a Medical Advance Directive? Yes  Type of Advance Directive (No Data)  Does patient want to make changes to medical advance directive? No - Patient declined  Copy of [place] in Chart? -  Would patient like information on creating a medical advance directive? No - Patient declined     Allergies  Allergen Reactions  . Penicillins Hives and Itching    Has patient had a PCN reaction causing immediate rash, facial/tongue/throat swelling, SOB or lightheadedness with hypotension: No Has patient had a PCN reaction causing severe rash involving mucus membranes or skin necrosis: Yes Has patient had a PCN reaction that required hospitalization No Has patient had a PCN reaction occurring within the last 10 years: No If all of the above answers are "NO", then may proceed with Cephalosporin use.     Chief Complaint  Patient presents with  . Discharge Note    HPI:  75 y.o. female seen today for discharge later this week.  She was admitted here after hospitalization for a saddle pulmonary embolism.  She was also found to have a left lower extremity DVT and continues on Eldoquin.  She also has a history of hypertension diabetes hyperlipidemia and complete heart block status post permanent pacemaker exertion.  Her stay here is been quite unremarkable Dr.Gupta did reduce her nifedipine  secondary to patient complaining of dizziness and she says her dizziness has improved.  Blood pressures appear to be stable 134/55-129/75-127/81 most recently.  She is not complaining of any shortness of breath or chest pain.  He does have a history of diabetes type 2 is on Glucophage as well as Lantus this appears relatively well controlled with blood sugars it appears largely in the 100s occasional spikes into the 200s but this does not appear to be consistent.  She was also found to be significantly B12 deficient and has been started on supplementation  She will be going home with family and will be at least for a while with her daughter family is very supportive.        Past Medical History:  Diagnosis Date  . Acid reflux   . Arthritis   . Complete heart block (HCC)    STJ PPM Dr. [name] 11/24/15  . Essential hypertension   . Hyperlipidemia   . Type 2 diabetes mellitus ([place])     Past Surgical History:  Procedure Laterality Date  . EP IMPLANTABLE DEVICE N/A 11/24/2015   Procedure: Pacemaker Implant;  Surgeon: [name], MD;  Location: [facility];  Service: Cardiovascular;  Laterality: N/A;  . JOINT REPLACEMENT     knees bilat.  . TUBAL LIGATION        reports that she has never smoked. She has never used smokeless tobacco. She reports that she does not drink alcohol or use drugs. Social History   Social History  . Marital status: Married    Spouse name: N/A  . Number of children: N/A  . Years of education: N/A  Occupational History  . Not on file.   Social History Main Topics  . Smoking status: Never Smoker  . Smokeless tobacco: Never Used  . Alcohol use No  . Drug use: No  . Sexual activity: Yes    Birth control/ protection: Post-menopausal   Other Topics Concern  . Not on file   Social History Narrative  . No narrative on file   Functional Status Survey:    Allergies  Allergen Reactions  . Penicillins Hives and Itching    Has  patient had a PCN reaction causing immediate rash, facial/tongue/throat swelling, SOB or lightheadedness with hypotension: No Has patient had a PCN reaction causing severe rash involving mucus membranes or skin necrosis: Yes Has patient had a PCN reaction that required hospitalization No Has patient had a PCN reaction occurring within the last 10 years: No If all of the above answers are "NO", then may proceed with Cephalosporin use.     Pertinent  Health Maintenance Due  Topic Date Due  . FOOT EXAM  03/05/1951  . OPHTHALMOLOGY EXAM  03/05/1951  . URINE MICROALBUMIN  03/05/1951  . COLONOSCOPY  03/05/1992  . DEXA SCAN  03/06/2007  . INFLUENZA VACCINE  11/25/2017 (Originally 07/26/2016)  . PNA vac Low Risk Adult (1 of 2 - PCV13) 11/25/2017 (Originally 03/06/2007)  . HEMOGLOBIN A1C  05/22/2017  . MAMMOGRAM  05/19/2018    Medications: Review of Systems  General no complaints of fever or chills currently.  Skin does not complain of any rashes or itching.  Head ears eyes nose mouth and throat is not complaining of a visual changes or sore throat.  Respiratory is not complaining of shortness breath or cough.  Cardiac denies chest pain area  GI does not complain of nausea vomiting diarrhea constipation or abdominal discomfort.  GU recently treated for UTI does not complaining of dysuria  Musculoskeletal is not complaining of joint pain does have some debility status post hospitalization -- history of obesity   Says the dizziness has improved does not complain of headache  numbness or syncope.--  Psych does not complain of overt depression or anxiety.     Current Outpatient Prescriptions on File Prior to Visit  Medication Sig Dispense Refill  . apixaban (ELIQUIS) 5 MG TABS tablet Take 5 mg by mouth 2 (two) times daily.    Marland Kitchen atorvastatin (LIPITOR) 40 MG tablet Take 40 mg by mouth at bedtime.     . hydrALAZINE (APRESOLINE) 25 MG tablet Take 25 mg by mouth 2 (two) times  daily.    Marland Kitchen LANTUS SOLOSTAR 100 UNIT/ML Solostar Pen Inject 30 Units into the skin at bedtime.     Marland Kitchen losartan (COZAAR) 100 MG tablet Take 100 mg by mouth every morning.     . metFORMIN (GLUCOPHAGE) 1000 MG tablet Take 1,000 mg by mouth 2 (two) times daily with a meal.    . oxyCODONE (OXY IR/ROXICODONE) 5 MG immediate release tablet Take one tablet by mouth every 4 hours as needed for pain 180 tablet 0  . promethazine (PHENERGAN) 12.5 MG tablet Take 12.5 mg by mouth every 6 (six) hours as needed for nausea or vomiting. End Date 11/28/16    . ranitidine (ZANTAC) 300 MG tablet Take 300 mg by mouth every morning.      No current facility-administered medications on file prior to visit.   Of note she also is on nifedipine 30 mg a day      Temperature 97.8 pulse 94 respirations 18 blood pressure  134/55  Physical Exam In general this is a pleasant obese elderly female in no distress   Her skin is warm and dry.  Eyes pupils appear reactive light sclera and conjunctiva are clear visual acuity appears intact.  Oropharynx is clear mucous membranes moist.  Heart is regular rate and rhythm without murmur gallop or rub she appears to have some edema of her left leg she says this is been fairly chronic especially when in a dependent position since the DVT pedal pulse is intact-there is no erythema or tenderness  Abdomen is obese soft nontender with positive bowel sounds.  GU could not really appreciate significant suprapubic tenderness.  Musculoskeletal is able to move all extremities 4 with appropriate strength. Currently ambulating in wheelchair  Neurologic is grossly intact her speech is clear no lateralizing findings.  Psych she is alert and oriented pleasant and appropriate   Labs reviewed: Basic Metabolic Panel:  Recent Labs  11/09/16 0339 11/16/16 0500 11/17/16 0820  NA 139 137 138  K 4.0 4.2 4.1  CL 104 105 104  CO2 28 27 26   GLUCOSE 108* 87 121*  BUN 18 17 17     CREATININE 0.77 0.77 0.75  CALCIUM 8.8* 8.9 8.7*   Liver Function Tests:  Recent Labs  11/04/16 1633  AST 17  ALT 14  ALKPHOS 77  BILITOT 0.9  PROT 7.5  ALBUMIN 3.6   No results for input(s): LIPASE, AMYLASE in the last 8760 hours. No results for input(s): AMMONIA in the last 8760 hours. CBC:  Recent Labs  11/04/16 1633  11/10/16 0316 11/16/16 0500 11/17/16 0820 11/22/16 0700  WBC 9.0  < > 7.5 7.4 8.3  --   NEUTROABS 6.7  --   --   --   --   --   HGB 12.3  < > 10.7* 10.6* 10.7*  --   HCT 35.6*  < > 31.0* 32.1* 32.2* 36.4  MCV 112.7*  < > 111.5* 111.8* 111.0*  --   PLT 184  < > 191 336 352  --   < > = values in this interval not displayed. Cardiac Enzymes:  Recent Labs  11/05/16 0327 11/05/16 0929 11/06/16 0238  TROPONINI 0.47* 0.26* 0.19*   BNP: Invalid input(s): POCBNP CBG:  Recent Labs  11/09/16 2256 11/10/16 0634 11/10/16 1208  GLUCAP 138* 132* 133*    Procedures and Imaging Studies During Stay: Ct Angio Chest Pe W And/or Wo Contrast  Result Date: 11/04/2016 CLINICAL DATA:  Weakness and dyspnea since this morning. EXAM: CT ANGIOGRAPHY CHEST WITH CONTRAST TECHNIQUE: Multidetector CT imaging of the chest was performed using the standard protocol during bolus administration of intravenous contrast. Multiplanar CT image reconstructions and MIPs were obtained to evaluate the vascular anatomy. CONTRAST:  100 cc IV Isovue 370 COMPARISON:  Same day chest radiograph CT abdomen from 06/19/2015 FINDINGS: Cardiovascular: There is a large saddle embolus with extension into the right and left lobar and right proximal segmental pulmonary arteries. RV/ LV ratio 1.62 consistent with right heart strain. The heart is enlarged. Aortic atherosclerosis without dissection. No pericardial effusion. Mediastinum/Nodes: Unremarkable esophagus and trachea. Substernal goiter. More No adenopathy. Lungs/Pleura: No pneumothorax. Minimal right and left lower lobe atelectasis. No  pneumonic consolidation or dominant mass. Upper Abdomen: Stable pair of right hepatic lobe cysts the largest measuring 2.6 cm have remained stable. The gallbladder is physiologically distended. The visualized spleen and pancreas as well as adrenal glands are without acute appearing abnormalities. Musculoskeletal: No chest wall abnormality. No acute  osseous abnormality. Multilevel mild degenerative disc disease of the visualized dorsal spine. Review of the MIP images confirms the above findings. IMPRESSION: Positive for acute PE with CT evidence of right heart strain (RV/LV Ratio = 1.6) consistent with at least submassive (intermediate risk) PE. The presence of right heart strain has been associated with an increased risk of morbidity and mortality. Saddle pulmonary embolus with extension into bilateral lower lobar and proximal right lower segmental pulmonary arteries. Critical Value/emergent results were called by telephone at the time of interpretation on 11/04/2016 at 7:29 pm to Dr. Merrily Pew , who verbally acknowledged these results. Electronically Signed   By: Ashley Royalty M.D.   On: 11/04/2016 19:30   Dg Chest Port 1 View  Result Date: 11/08/2016 CLINICAL DATA:  75 year old female with fever. Subsequent encounter. EXAM: PORTABLE CHEST 1 VIEW COMPARISON:  11/04/2016, 11/25/2015 and 02/17/2008. FINDINGS: Sequential pacemaker enters from the left with leads in relatively similar position. Cardiomegaly. No infiltrate, congestive heart failure or pneumothorax. Calcified aorta No plain film evidence of pulmonary malignancy. IMPRESSION: No infiltrate or congestive heart failure. Pacemaker in place with mild cardiomegaly. Aortic atherosclerosis. Electronically Signed   By: Genia Del M.D.   On: 11/08/2016 12:07   Dg Chest Portable 1 View  Result Date: 11/04/2016 CLINICAL DATA:  Tachycardia and shortness of breath EXAM: PORTABLE CHEST 1 VIEW COMPARISON:  November 25, 2015 FINDINGS: Mild cardiomegaly.  Stable pacemaker. No pneumothorax. No pulmonary nodules, masses, or focal infiltrates. IMPRESSION: No active disease. Electronically Signed   By: Dorise Bullion III M.D   On: 11/04/2016 17:23    Assessment/Plan:    .#1-.history of saddle pulmonary embolus with left leg DVT-again she is on   Eliquist-- 5 mg twice a day recommendation to continue anticoagulation for 1 year-clinically she appears stable no complaints of chest pain or shortness of breath.   #2 history of hypertension at this point appears to be stable -- she is on hydralazine 25 mg twice a day Cozaar 100 mg daily-and nifedipine 30 mg a day. This was reduced secondary to concerns of dizziness-apparently dizziness has improved   #3 history UTI positive for Proteus she has completed a course of Cipro is not complaining of dysuria today  #4-history of GERD continues on Zantac.  #5 history diabetes type 2 she is on Lantus 30 units daily as well as Glucophage thousand milligrams twice a day CBGs appear to be fairly stable in 100's range  with occasional spikes above 200.  #6-history of hyperlipidemia she continues atorvastatin 40 mg daily at bedtime--since her stay here has been short will defer follow-up to primary care provider  #7-history of heart block she does have history of pacemaker heart rate appears to be controlled this apparently has been stable and was not an issue either in the hospital.  #8-B12 deficiency she has been started on supplementation  Anemia-hemoglobin has been stable at 10.7 follow-up with primary care provider as warranted-she has been started on B12-her MCV was elevated             Patient is being discharged with the following home health services:  PT and OT for strengthening    :    Patient has been advised to f/u with their PCP in 1-2 weeks to bring them up to date on their rehab stay.  Social services at facility was responsible for arranging this appointment.  Pt was  provided with a 30 day supply of prescriptions for medications and refills must be obtained  from their PCP.  For controlled substances, a more limited supply may be provided adequate until PCP appointment only.   [id number] note greater than 30 minutes spent on this discharge summary-greater than 50% of time spent coordinating a plan of care for numerous diagnoses

## 2016-12-01 DIAGNOSIS — E119 Type 2 diabetes mellitus without complications: Secondary | ICD-10-CM | POA: Diagnosis not present

## 2016-12-01 DIAGNOSIS — I82402 Acute embolism and thrombosis of unspecified deep veins of left lower extremity: Secondary | ICD-10-CM | POA: Diagnosis not present

## 2016-12-01 DIAGNOSIS — Z7984 Long term (current) use of oral hypoglycemic drugs: Secondary | ICD-10-CM | POA: Diagnosis not present

## 2016-12-01 DIAGNOSIS — I2692 Saddle embolus of pulmonary artery without acute cor pulmonale: Secondary | ICD-10-CM | POA: Diagnosis not present

## 2016-12-01 DIAGNOSIS — Z7901 Long term (current) use of anticoagulants: Secondary | ICD-10-CM | POA: Diagnosis not present

## 2016-12-01 DIAGNOSIS — I10 Essential (primary) hypertension: Secondary | ICD-10-CM | POA: Diagnosis not present

## 2016-12-01 DIAGNOSIS — E785 Hyperlipidemia, unspecified: Secondary | ICD-10-CM | POA: Diagnosis not present

## 2016-12-01 DIAGNOSIS — Z950 Presence of cardiac pacemaker: Secondary | ICD-10-CM | POA: Diagnosis not present

## 2016-12-02 DIAGNOSIS — Z7901 Long term (current) use of anticoagulants: Secondary | ICD-10-CM | POA: Diagnosis not present

## 2016-12-02 DIAGNOSIS — I2692 Saddle embolus of pulmonary artery without acute cor pulmonale: Secondary | ICD-10-CM | POA: Diagnosis not present

## 2016-12-02 DIAGNOSIS — I10 Essential (primary) hypertension: Secondary | ICD-10-CM | POA: Diagnosis not present

## 2016-12-02 DIAGNOSIS — Z950 Presence of cardiac pacemaker: Secondary | ICD-10-CM | POA: Diagnosis not present

## 2016-12-02 DIAGNOSIS — Z7984 Long term (current) use of oral hypoglycemic drugs: Secondary | ICD-10-CM | POA: Diagnosis not present

## 2016-12-02 DIAGNOSIS — E119 Type 2 diabetes mellitus without complications: Secondary | ICD-10-CM | POA: Diagnosis not present

## 2016-12-02 DIAGNOSIS — E785 Hyperlipidemia, unspecified: Secondary | ICD-10-CM | POA: Diagnosis not present

## 2016-12-02 DIAGNOSIS — I82402 Acute embolism and thrombosis of unspecified deep veins of left lower extremity: Secondary | ICD-10-CM | POA: Diagnosis not present

## 2016-12-06 DIAGNOSIS — E119 Type 2 diabetes mellitus without complications: Secondary | ICD-10-CM | POA: Diagnosis not present

## 2016-12-06 DIAGNOSIS — Z950 Presence of cardiac pacemaker: Secondary | ICD-10-CM | POA: Diagnosis not present

## 2016-12-06 DIAGNOSIS — I10 Essential (primary) hypertension: Secondary | ICD-10-CM | POA: Diagnosis not present

## 2016-12-06 DIAGNOSIS — I2692 Saddle embolus of pulmonary artery without acute cor pulmonale: Secondary | ICD-10-CM | POA: Diagnosis not present

## 2016-12-06 DIAGNOSIS — E785 Hyperlipidemia, unspecified: Secondary | ICD-10-CM | POA: Diagnosis not present

## 2016-12-06 DIAGNOSIS — I82402 Acute embolism and thrombosis of unspecified deep veins of left lower extremity: Secondary | ICD-10-CM | POA: Diagnosis not present

## 2016-12-06 DIAGNOSIS — Z7901 Long term (current) use of anticoagulants: Secondary | ICD-10-CM | POA: Diagnosis not present

## 2016-12-06 DIAGNOSIS — Z7984 Long term (current) use of oral hypoglycemic drugs: Secondary | ICD-10-CM | POA: Diagnosis not present

## 2016-12-07 DIAGNOSIS — Z7984 Long term (current) use of oral hypoglycemic drugs: Secondary | ICD-10-CM | POA: Diagnosis not present

## 2016-12-07 DIAGNOSIS — I82402 Acute embolism and thrombosis of unspecified deep veins of left lower extremity: Secondary | ICD-10-CM | POA: Diagnosis not present

## 2016-12-07 DIAGNOSIS — I2782 Chronic pulmonary embolism: Secondary | ICD-10-CM | POA: Diagnosis not present

## 2016-12-07 DIAGNOSIS — E785 Hyperlipidemia, unspecified: Secondary | ICD-10-CM | POA: Diagnosis not present

## 2016-12-07 DIAGNOSIS — I442 Atrioventricular block, complete: Secondary | ICD-10-CM | POA: Diagnosis not present

## 2016-12-07 DIAGNOSIS — E119 Type 2 diabetes mellitus without complications: Secondary | ICD-10-CM | POA: Diagnosis not present

## 2016-12-07 DIAGNOSIS — Z950 Presence of cardiac pacemaker: Secondary | ICD-10-CM | POA: Diagnosis not present

## 2016-12-07 DIAGNOSIS — I2692 Saddle embolus of pulmonary artery without acute cor pulmonale: Secondary | ICD-10-CM | POA: Diagnosis not present

## 2016-12-07 DIAGNOSIS — Z7901 Long term (current) use of anticoagulants: Secondary | ICD-10-CM | POA: Diagnosis not present

## 2016-12-07 DIAGNOSIS — E1140 Type 2 diabetes mellitus with diabetic neuropathy, unspecified: Secondary | ICD-10-CM | POA: Diagnosis not present

## 2016-12-07 DIAGNOSIS — I10 Essential (primary) hypertension: Secondary | ICD-10-CM | POA: Diagnosis not present

## 2016-12-08 DIAGNOSIS — Z7901 Long term (current) use of anticoagulants: Secondary | ICD-10-CM | POA: Diagnosis not present

## 2016-12-08 DIAGNOSIS — E119 Type 2 diabetes mellitus without complications: Secondary | ICD-10-CM | POA: Diagnosis not present

## 2016-12-08 DIAGNOSIS — I2692 Saddle embolus of pulmonary artery without acute cor pulmonale: Secondary | ICD-10-CM | POA: Diagnosis not present

## 2016-12-08 DIAGNOSIS — I82402 Acute embolism and thrombosis of unspecified deep veins of left lower extremity: Secondary | ICD-10-CM | POA: Diagnosis not present

## 2016-12-08 DIAGNOSIS — E785 Hyperlipidemia, unspecified: Secondary | ICD-10-CM | POA: Diagnosis not present

## 2016-12-08 DIAGNOSIS — Z7984 Long term (current) use of oral hypoglycemic drugs: Secondary | ICD-10-CM | POA: Diagnosis not present

## 2016-12-08 DIAGNOSIS — Z950 Presence of cardiac pacemaker: Secondary | ICD-10-CM | POA: Diagnosis not present

## 2016-12-08 DIAGNOSIS — I10 Essential (primary) hypertension: Secondary | ICD-10-CM | POA: Diagnosis not present

## 2016-12-09 DIAGNOSIS — I82402 Acute embolism and thrombosis of unspecified deep veins of left lower extremity: Secondary | ICD-10-CM | POA: Diagnosis not present

## 2016-12-09 DIAGNOSIS — Z7984 Long term (current) use of oral hypoglycemic drugs: Secondary | ICD-10-CM | POA: Diagnosis not present

## 2016-12-09 DIAGNOSIS — Z7901 Long term (current) use of anticoagulants: Secondary | ICD-10-CM | POA: Diagnosis not present

## 2016-12-09 DIAGNOSIS — I10 Essential (primary) hypertension: Secondary | ICD-10-CM | POA: Diagnosis not present

## 2016-12-09 DIAGNOSIS — Z950 Presence of cardiac pacemaker: Secondary | ICD-10-CM | POA: Diagnosis not present

## 2016-12-09 DIAGNOSIS — E785 Hyperlipidemia, unspecified: Secondary | ICD-10-CM | POA: Diagnosis not present

## 2016-12-09 DIAGNOSIS — I2692 Saddle embolus of pulmonary artery without acute cor pulmonale: Secondary | ICD-10-CM | POA: Diagnosis not present

## 2016-12-09 DIAGNOSIS — E119 Type 2 diabetes mellitus without complications: Secondary | ICD-10-CM | POA: Diagnosis not present

## 2016-12-12 DIAGNOSIS — E119 Type 2 diabetes mellitus without complications: Secondary | ICD-10-CM | POA: Diagnosis not present

## 2016-12-12 DIAGNOSIS — I82402 Acute embolism and thrombosis of unspecified deep veins of left lower extremity: Secondary | ICD-10-CM | POA: Diagnosis not present

## 2016-12-12 DIAGNOSIS — Z7984 Long term (current) use of oral hypoglycemic drugs: Secondary | ICD-10-CM | POA: Diagnosis not present

## 2016-12-12 DIAGNOSIS — E785 Hyperlipidemia, unspecified: Secondary | ICD-10-CM | POA: Diagnosis not present

## 2016-12-12 DIAGNOSIS — Z950 Presence of cardiac pacemaker: Secondary | ICD-10-CM | POA: Diagnosis not present

## 2016-12-12 DIAGNOSIS — I10 Essential (primary) hypertension: Secondary | ICD-10-CM | POA: Diagnosis not present

## 2016-12-12 DIAGNOSIS — I2692 Saddle embolus of pulmonary artery without acute cor pulmonale: Secondary | ICD-10-CM | POA: Diagnosis not present

## 2016-12-12 DIAGNOSIS — Z7901 Long term (current) use of anticoagulants: Secondary | ICD-10-CM | POA: Diagnosis not present

## 2016-12-13 DIAGNOSIS — E785 Hyperlipidemia, unspecified: Secondary | ICD-10-CM | POA: Diagnosis not present

## 2016-12-13 DIAGNOSIS — E119 Type 2 diabetes mellitus without complications: Secondary | ICD-10-CM | POA: Diagnosis not present

## 2016-12-13 DIAGNOSIS — Z7984 Long term (current) use of oral hypoglycemic drugs: Secondary | ICD-10-CM | POA: Diagnosis not present

## 2016-12-13 DIAGNOSIS — I10 Essential (primary) hypertension: Secondary | ICD-10-CM | POA: Diagnosis not present

## 2016-12-13 DIAGNOSIS — I82402 Acute embolism and thrombosis of unspecified deep veins of left lower extremity: Secondary | ICD-10-CM | POA: Diagnosis not present

## 2016-12-13 DIAGNOSIS — I2692 Saddle embolus of pulmonary artery without acute cor pulmonale: Secondary | ICD-10-CM | POA: Diagnosis not present

## 2016-12-13 DIAGNOSIS — Z7901 Long term (current) use of anticoagulants: Secondary | ICD-10-CM | POA: Diagnosis not present

## 2016-12-13 DIAGNOSIS — Z950 Presence of cardiac pacemaker: Secondary | ICD-10-CM | POA: Diagnosis not present

## 2016-12-14 DIAGNOSIS — Z7984 Long term (current) use of oral hypoglycemic drugs: Secondary | ICD-10-CM | POA: Diagnosis not present

## 2016-12-14 DIAGNOSIS — Z7901 Long term (current) use of anticoagulants: Secondary | ICD-10-CM | POA: Diagnosis not present

## 2016-12-14 DIAGNOSIS — I82402 Acute embolism and thrombosis of unspecified deep veins of left lower extremity: Secondary | ICD-10-CM | POA: Diagnosis not present

## 2016-12-14 DIAGNOSIS — I10 Essential (primary) hypertension: Secondary | ICD-10-CM | POA: Diagnosis not present

## 2016-12-14 DIAGNOSIS — E119 Type 2 diabetes mellitus without complications: Secondary | ICD-10-CM | POA: Diagnosis not present

## 2016-12-14 DIAGNOSIS — I2692 Saddle embolus of pulmonary artery without acute cor pulmonale: Secondary | ICD-10-CM | POA: Diagnosis not present

## 2016-12-14 DIAGNOSIS — Z950 Presence of cardiac pacemaker: Secondary | ICD-10-CM | POA: Diagnosis not present

## 2016-12-14 DIAGNOSIS — E785 Hyperlipidemia, unspecified: Secondary | ICD-10-CM | POA: Diagnosis not present

## 2016-12-20 DIAGNOSIS — I2692 Saddle embolus of pulmonary artery without acute cor pulmonale: Secondary | ICD-10-CM | POA: Diagnosis not present

## 2016-12-20 DIAGNOSIS — E785 Hyperlipidemia, unspecified: Secondary | ICD-10-CM | POA: Diagnosis not present

## 2016-12-20 DIAGNOSIS — I10 Essential (primary) hypertension: Secondary | ICD-10-CM | POA: Diagnosis not present

## 2016-12-20 DIAGNOSIS — Z7901 Long term (current) use of anticoagulants: Secondary | ICD-10-CM | POA: Diagnosis not present

## 2016-12-20 DIAGNOSIS — Z950 Presence of cardiac pacemaker: Secondary | ICD-10-CM | POA: Diagnosis not present

## 2016-12-20 DIAGNOSIS — I82402 Acute embolism and thrombosis of unspecified deep veins of left lower extremity: Secondary | ICD-10-CM | POA: Diagnosis not present

## 2016-12-20 DIAGNOSIS — E119 Type 2 diabetes mellitus without complications: Secondary | ICD-10-CM | POA: Diagnosis not present

## 2016-12-20 DIAGNOSIS — Z7984 Long term (current) use of oral hypoglycemic drugs: Secondary | ICD-10-CM | POA: Diagnosis not present

## 2016-12-22 DIAGNOSIS — Z950 Presence of cardiac pacemaker: Secondary | ICD-10-CM | POA: Diagnosis not present

## 2016-12-22 DIAGNOSIS — E785 Hyperlipidemia, unspecified: Secondary | ICD-10-CM | POA: Diagnosis not present

## 2016-12-22 DIAGNOSIS — I82402 Acute embolism and thrombosis of unspecified deep veins of left lower extremity: Secondary | ICD-10-CM | POA: Diagnosis not present

## 2016-12-22 DIAGNOSIS — I2692 Saddle embolus of pulmonary artery without acute cor pulmonale: Secondary | ICD-10-CM | POA: Diagnosis not present

## 2016-12-22 DIAGNOSIS — Z7984 Long term (current) use of oral hypoglycemic drugs: Secondary | ICD-10-CM | POA: Diagnosis not present

## 2016-12-22 DIAGNOSIS — I10 Essential (primary) hypertension: Secondary | ICD-10-CM | POA: Diagnosis not present

## 2016-12-22 DIAGNOSIS — E119 Type 2 diabetes mellitus without complications: Secondary | ICD-10-CM | POA: Diagnosis not present

## 2016-12-22 DIAGNOSIS — Z7901 Long term (current) use of anticoagulants: Secondary | ICD-10-CM | POA: Diagnosis not present

## 2016-12-29 DIAGNOSIS — I2692 Saddle embolus of pulmonary artery without acute cor pulmonale: Secondary | ICD-10-CM | POA: Diagnosis not present

## 2017-01-09 ENCOUNTER — Other Ambulatory Visit (HOSPITAL_COMMUNITY): Payer: Self-pay | Admitting: Internal Medicine

## 2017-01-09 DIAGNOSIS — I442 Atrioventricular block, complete: Secondary | ICD-10-CM | POA: Diagnosis not present

## 2017-01-09 DIAGNOSIS — E785 Hyperlipidemia, unspecified: Secondary | ICD-10-CM | POA: Diagnosis not present

## 2017-01-09 DIAGNOSIS — I10 Essential (primary) hypertension: Secondary | ICD-10-CM | POA: Diagnosis not present

## 2017-01-09 DIAGNOSIS — E1165 Type 2 diabetes mellitus with hyperglycemia: Secondary | ICD-10-CM | POA: Diagnosis not present

## 2017-01-09 DIAGNOSIS — Z950 Presence of cardiac pacemaker: Secondary | ICD-10-CM | POA: Diagnosis not present

## 2017-01-09 DIAGNOSIS — Z780 Asymptomatic menopausal state: Secondary | ICD-10-CM

## 2017-01-09 DIAGNOSIS — E1140 Type 2 diabetes mellitus with diabetic neuropathy, unspecified: Secondary | ICD-10-CM | POA: Diagnosis not present

## 2017-01-11 ENCOUNTER — Other Ambulatory Visit (HOSPITAL_COMMUNITY): Payer: Medicare Other

## 2017-01-18 ENCOUNTER — Ambulatory Visit (HOSPITAL_COMMUNITY)
Admission: RE | Admit: 2017-01-18 | Discharge: 2017-01-18 | Disposition: A | Discharge status: Home / Self Care | Payer: Medicare Other | Source: Ambulatory Visit | Attending: Internal Medicine | Admitting: Internal Medicine

## 2017-01-18 DIAGNOSIS — Z780 Asymptomatic menopausal state: Secondary | ICD-10-CM | POA: Diagnosis not present

## 2017-01-20 DIAGNOSIS — I10 Essential (primary) hypertension: Secondary | ICD-10-CM | POA: Diagnosis not present

## 2017-02-16 ENCOUNTER — Ambulatory Visit (INDEPENDENT_AMBULATORY_CARE_PROVIDER_SITE_OTHER): Payer: Medicare Other | Admitting: Internal Medicine

## 2017-02-16 ENCOUNTER — Encounter: Payer: Self-pay | Admitting: Internal Medicine

## 2017-02-16 DIAGNOSIS — I442 Atrioventricular block, complete: Secondary | ICD-10-CM

## 2017-02-16 NOTE — Progress Notes (Signed)
HPI Natalie Castro presents today for ongoing evaluation and management of her PPM. She is a pleasant 76 yo woman with a h/o HTN and DM who developed syncope and sob and was found to have CHB, and underwent PPM insertion. She did not take her meds this morning. She was hospitalized in November with pulmonary emboli. No chest pain. Allergies  Allergen Reactions  . Penicillins Hives and Itching    Has patient had a PCN reaction causing immediate rash, facial/tongue/throat swelling, SOB or lightheadedness with hypotension: No Has patient had a PCN reaction causing severe rash involving mucus membranes or skin necrosis: Yes Has patient had a PCN reaction that required hospitalization No Has patient had a PCN reaction occurring within the last 10 years: No If all of the above answers are "NO", then may proceed with Cephalosporin use.      Current Outpatient Prescriptions  Medication Sig Dispense Refill  . apixaban (ELIQUIS) 5 MG TABS tablet Take 5 mg by mouth 2 (two) times daily.    Marland Kitchen atorvastatin (LIPITOR) 40 MG tablet Take 40 mg by mouth at bedtime.     . hydrALAZINE (APRESOLINE) 25 MG tablet Take 25 mg by mouth 2 (two) times daily.    Marland Kitchen LANTUS SOLOSTAR 100 UNIT/ML Solostar Pen Inject 30 Units into the skin at bedtime.     Marland Kitchen losartan (COZAAR) 100 MG tablet Take 100 mg by mouth every morning.     . metFORMIN (GLUCOPHAGE) 1000 MG tablet Take 1,000 mg by mouth 2 (two) times daily with a meal.    . NIFEdipine (PROCARDIA-XL/ADALAT CC) 30 MG 24 hr tablet Take 30 mg by mouth daily.    . ranitidine (ZANTAC) 300 MG tablet Take 300 mg by mouth every morning.      No current facility-administered medications for this visit.      Past Medical History:  Diagnosis Date  . Acid reflux   . Arthritis   . Complete heart block (HCC)    STJ PPM Dr. [name] 11/24/15  . Essential hypertension   . Hyperlipidemia   . Type 2 diabetes mellitus (HCC)     ROS:   All systems reviewed and negative  except as noted in the HPI.   Past Surgical History:  Procedure Laterality Date  . EP IMPLANTABLE DEVICE N/A 11/24/2015   Procedure: Pacemaker Implant;  Surgeon: [name], MD;  Location: [facility];  Service: Cardiovascular;  Laterality: N/A;  . JOINT REPLACEMENT     knees bilat.  . TUBAL LIGATION       Family History  Problem Relation Age of Onset  . Heart failure Mother   . Hypertension Mother   . Heart failure Sister   . Hypertension Father      Social History   Social History  . Marital status: Married    Spouse name: N/A  . Number of children: N/A  . Years of education: N/A   Occupational History  . Not on file.   Social History Main Topics  . Smoking status: Never Smoker  . Smokeless tobacco: Never Used  . Alcohol use No  . Drug use: No  . Sexual activity: Yes    Birth control/ protection: Post-menopausal   Other Topics Concern  . Not on file   Social History Narrative  . No narrative on file     BP (!) 166/80   Pulse 97   Ht 5\' 5"  (1.651 m)   Wt 206 lb (93.4 kg)  SpO2 95%   BMI 34.28 kg/m   Physical Exam:  Well appearing NAD HEENT: Unremarkable Neck:  No JVD, no thyromegally Lymphatics:  No adenopathy Back:  No CVA tenderness Lungs:  Clear with no wheezes, well healed PPM incision. HEART:  Regular rate rhythm, no murmurs, no rubs, no clicks Abd:  soft, positive bowel sounds, no organomegally, no rebound, no guarding Ext:  2 plus pulses, no edema, no cyanosis, no clubbing Skin:  No rashes no nodules Neuro:  CN II through XII intact, motor grossly intact  DEVICE  Normal device function.  See PaceArt for details.   Assess/Plan: 1. Complete heart block - she is doing well, s/p PPM insertion. She has some conduction. 2. Syncope - none since her PPM insertion 3. Diaphragmatic stimulation - this has resolved. Will follow.  4. HTN - her blood pressure today is elevated but she notes that she did not take her meds this morning.  Will follow.  Mikle Bosworth.D.

## 2017-02-16 NOTE — Patient Instructions (Signed)
Your physician wants you to follow-up in: 1 Year with Dr. Lovena Le. You will receive a reminder letter in the mail two months in advance. If you don't receive a letter, please call our office to schedule the follow-up appointment.  Your physician recommends that you continue on your current medications as directed. Please refer to the Current Medication list given to you today.  Remote monitoring is used to monitor your Pacemaker of ICD from home. This monitoring reduces the number of office visits required to check your device to one time per year. It allows [place] to keep an eye on the functioning of your device to ensure it is working properly. You are scheduled for a device check from home on 05/18/17. You may send your transmission at any time that day. If you have a wireless device, the transmission will be sent automatically. After your physician reviews your transmission, you will receive a postcard with your next transmission date.  If you need a refill on your cardiac medications before your next appointment, please call your pharmacy.  Thank you for choosing [facility]!

## 2017-02-17 LAB — CUP PACEART INCLINIC DEVICE CHECK
Battery Remaining Longevity: 117 mo
Battery Remaining Percentage: 95 %
Battery Voltage: 3.02 V
Brady Statistic RV Percent Paced: 23 %
Implantable Lead Implant Date: 20161129
Implantable Lead Location: 753860
Implantable Lead Model: 5076
Implantable Pulse Generator Implant Date: 20161129
Lead Channel Impedance Value: 390 Ohm
Lead Channel Pacing Threshold Amplitude: 0.5 V
Lead Channel Pacing Threshold Amplitude: 0.75 V
Lead Channel Pacing Threshold Pulse Width: 0.4 ms
Lead Channel Sensing Intrinsic Amplitude: 11 mV
Lead Channel Setting Pacing Amplitude: 2 V
Lead Channel Setting Pacing Pulse Width: 0.4 ms
Lead Channel Setting Sensing Sensitivity: 4 mV
MDC IDC LEAD IMPLANT DT: 20161129
MDC IDC LEAD LOCATION: 753859
MDC IDC MSMT LEADCHNL RA SENSING INTR AMPL: 4.7 mV
MDC IDC MSMT LEADCHNL RV IMPEDANCE VALUE: 530 Ohm
MDC IDC MSMT LEADCHNL RV PACING THRESHOLD PULSEWIDTH: 0.4 ms
MDC IDC PG SERIAL: 7826037
MDC IDC SESS DTM: 20180223103339
MDC IDC SET LEADCHNL RA PACING AMPLITUDE: 1.875
MDC IDC STAT BRADY RA PERCENT PACED: 1 % — AB

## 2017-02-22 ENCOUNTER — Inpatient Hospital Stay (HOSPITAL_COMMUNITY)
Admission: EM | Admit: 2017-02-22 | Discharge: 2017-02-26 | DRG: 195 | Disposition: A | Discharge status: Home / Self Care | Payer: Medicare Other | Attending: Internal Medicine | Admitting: Internal Medicine

## 2017-02-22 ENCOUNTER — Encounter (HOSPITAL_COMMUNITY): Payer: Self-pay | Admitting: *Deleted

## 2017-02-22 ENCOUNTER — Emergency Department (HOSPITAL_COMMUNITY): Payer: Medicare Other

## 2017-02-22 DIAGNOSIS — Z950 Presence of cardiac pacemaker: Secondary | ICD-10-CM

## 2017-02-22 DIAGNOSIS — Z880 Allergy status to penicillin: Secondary | ICD-10-CM

## 2017-02-22 DIAGNOSIS — Z7984 Long term (current) use of oral hypoglycemic drugs: Secondary | ICD-10-CM

## 2017-02-22 DIAGNOSIS — M1990 Unspecified osteoarthritis, unspecified site: Secondary | ICD-10-CM | POA: Diagnosis present

## 2017-02-22 DIAGNOSIS — I442 Atrioventricular block, complete: Secondary | ICD-10-CM | POA: Diagnosis not present

## 2017-02-22 DIAGNOSIS — R410 Disorientation, unspecified: Secondary | ICD-10-CM

## 2017-02-22 DIAGNOSIS — Z7901 Long term (current) use of anticoagulants: Secondary | ICD-10-CM | POA: Diagnosis not present

## 2017-02-22 DIAGNOSIS — M25569 Pain in unspecified knee: Secondary | ICD-10-CM | POA: Diagnosis present

## 2017-02-22 DIAGNOSIS — J189 Pneumonia, unspecified organism: Secondary | ICD-10-CM | POA: Diagnosis not present

## 2017-02-22 DIAGNOSIS — Z86711 Personal history of pulmonary embolism: Secondary | ICD-10-CM

## 2017-02-22 DIAGNOSIS — E119 Type 2 diabetes mellitus without complications: Secondary | ICD-10-CM | POA: Diagnosis not present

## 2017-02-22 DIAGNOSIS — Z96653 Presence of artificial knee joint, bilateral: Secondary | ICD-10-CM | POA: Diagnosis present

## 2017-02-22 DIAGNOSIS — E1165 Type 2 diabetes mellitus with hyperglycemia: Secondary | ICD-10-CM | POA: Diagnosis not present

## 2017-02-22 DIAGNOSIS — Z23 Encounter for immunization: Secondary | ICD-10-CM | POA: Diagnosis not present

## 2017-02-22 DIAGNOSIS — K219 Gastro-esophageal reflux disease without esophagitis: Secondary | ICD-10-CM | POA: Diagnosis present

## 2017-02-22 DIAGNOSIS — Z8249 Family history of ischemic heart disease and other diseases of the circulatory system: Secondary | ICD-10-CM

## 2017-02-22 DIAGNOSIS — E785 Hyperlipidemia, unspecified: Secondary | ICD-10-CM | POA: Diagnosis present

## 2017-02-22 DIAGNOSIS — I10 Essential (primary) hypertension: Secondary | ICD-10-CM | POA: Diagnosis present

## 2017-02-22 DIAGNOSIS — R05 Cough: Secondary | ICD-10-CM | POA: Diagnosis not present

## 2017-02-22 HISTORY — DX: Presence of cardiac pacemaker: Z95.0

## 2017-02-22 LAB — COMPREHENSIVE METABOLIC PANEL
ALBUMIN: 3.1 g/dL — AB (ref 3.5–5.0)
ALK PHOS: 57 U/L (ref 38–126)
ALT: 11 U/L — AB (ref 14–54)
ANION GAP: 10 (ref 5–15)
AST: 20 U/L (ref 15–41)
BUN: 10 mg/dL (ref 6–20)
CALCIUM: 8.6 mg/dL — AB (ref 8.9–10.3)
CO2: 24 mmol/L (ref 22–32)
CREATININE: 0.88 mg/dL (ref 0.44–1.00)
Chloride: 102 mmol/L (ref 101–111)
GFR calc Af Amer: >60 mL/min (ref >60)
GFR calc non Af Amer: >60 mL/min (ref >60)
GLUCOSE: 242 mg/dL — AB (ref 65–99)
Potassium: 3.4 mmol/L — ABNORMAL LOW (ref 3.5–5.1)
Sodium: 136 mmol/L (ref 135–145)
TOTAL PROTEIN: 7.4 g/dL (ref 6.5–8.1)
Total Bilirubin: 0.5 mg/dL (ref 0.3–1.2)

## 2017-02-22 LAB — CBC WITH DIFFERENTIAL/PLATELET
BASOS PCT: 0 %
Basophils Absolute: 0 10*3/uL (ref 0.0–0.1)
EOS PCT: 0 %
Eosinophils Absolute: 0 10*3/uL (ref 0.0–0.7)
HCT: 37.4 % (ref 36.0–46.0)
Hemoglobin: 12.3 g/dL (ref 12.0–15.0)
LYMPHS ABS: 1.9 10*3/uL (ref 0.7–4.0)
Lymphocytes Relative: 10 %
MCH: 29.6 pg (ref 26.0–34.0)
MCHC: 32.9 g/dL (ref 30.0–36.0)
MCV: 90.1 fL (ref 78.0–100.0)
MONOS PCT: 8 %
Monocytes Absolute: 1.5 10*3/uL — ABNORMAL HIGH (ref 0.1–1.0)
NEUTROS PCT: 82 %
Neutro Abs: 14.7 10*3/uL — ABNORMAL HIGH (ref 1.7–7.7)
Platelets: 208 10*3/uL (ref 150–400)
RBC: 4.15 MIL/uL (ref 3.87–5.11)
RDW: 14.1 % (ref 11.5–15.5)
WBC: 18 10*3/uL — ABNORMAL HIGH (ref 4.0–10.5)

## 2017-02-22 LAB — CBG MONITORING, ED: GLUCOSE-CAPILLARY: 262 mg/dL — AB (ref 65–99)

## 2017-02-22 LAB — LIPASE, BLOOD: Lipase: 12 U/L (ref 11–51)

## 2017-02-22 LAB — LACTIC ACID, PLASMA: LACTIC ACID, VENOUS: 1.3 mmol/L (ref 0.5–1.9)

## 2017-02-22 MED ORDER — LEVOFLOXACIN IN D5W 750 MG/150ML IV SOLN
750.0000 mg | Freq: Once | INTRAVENOUS | Status: AC
  Administered 2017-02-22: 750 mg via INTRAVENOUS
  Filled 2017-02-22: qty 150

## 2017-02-22 MED ORDER — VANCOMYCIN HCL IN DEXTROSE 1-5 GM/200ML-% IV SOLN: 1000.0000 mg | Freq: Once | INTRAVENOUS | Status: DC

## 2017-02-22 MED ORDER — SODIUM CHLORIDE 0.9 % IV BOLUS (SEPSIS)
500.0000 mL | Freq: Once | INTRAVENOUS | Status: AC
  Administered 2017-02-22: 500 mL via INTRAVENOUS

## 2017-02-22 NOTE — H&P (Signed)
History and Physical    Natalie Castro [id number] DOB: 1941-04-13 DOA: 02/22/2017  PCP: [name], MD  Patient coming from: Home.    Chief Complaint:  Confusion, transient.   HPI: Natalie Castro is an 76 y.o. female lives at home with her husband, hx of GERD, HTN, DM, heart block s/p ppm, brought to the ER as she was confused at home.  Her confusion was mild, and it was transient, as when she came to the ER, she was lucid and back to her baseline, confirmed by her son.  Work up included a CXR which suggested PNA, and she was given IV Levoquin.  She was found to have leukocytosis with WBC of 18K, normal renal Fx tst, and BS of 247.  Hospitalist was asked to admit her for CAP in the setting of transient confusion.  She was not hypoxic, but she was febrile.   ED Course:  See above.  Rewiew of Systems:  Constitutional: Negative for malaise, fever and chills. No significant weight loss or weight gain Eyes: Negative for eye pain, redness and discharge, diplopia, visual changes, or flashes of light. ENMT: Negative for ear pain, hoarseness, nasal congestion, sinus pressure and sore throat. No headaches; tinnitus, drooling, or problem swallowing. Cardiovascular: Negative for chest pain, palpitations, diaphoresis, dyspnea and peripheral edema. ; No orthopnea, PND Respiratory: Negative for hemoptysis, wheezing and stridor. No pleuritic chestpain. Gastrointestinal: Negative for diarrhea, constipation,  melena, blood in stool, hematemesis, jaundice and rectal bleeding.    Genitourinary: Negative for frequency, dysuria, incontinence,flank pain and hematuria; Musculoskeletal: Negative for back pain and neck pain. Negative for swelling and trauma.;  Skin: . Negative for pruritus, rash, abrasions, bruising and skin lesion.; ulcerations Neuro: Negative for headache, lightheadedness and neck stiffness. Negative for weakness, altered level of consciousness , altered mental status, extremity weakness, burning  feet, involuntary movement, seizure and syncope.  Psych: negative for anxiety, depression, insomnia, tearfulness, panic attacks, hallucinations, paranoia, suicidal or homicidal ideation    Past Medical History:  Diagnosis Date  . Acid reflux   . Arthritis   . Complete heart block (HCC)    STJ PPM Dr. [name] 11/24/15  . Essential hypertension   . Hyperlipidemia   . Type 2 diabetes mellitus ([place])     Past Surgical History:  Procedure Laterality Date  . EP IMPLANTABLE DEVICE N/A 11/24/2015   Procedure: Pacemaker Implant;  Surgeon: [name], MD;  Location: [facility];  Service: Cardiovascular;  Laterality: N/A;  . JOINT REPLACEMENT     knees bilat.  . TUBAL LIGATION       reports that she has never smoked. She has never used smokeless tobacco. She reports that she does not drink alcohol or use drugs.  Allergies  Allergen Reactions  . Penicillins Hives and Itching    Has patient had a PCN reaction causing immediate rash, facial/tongue/throat swelling, SOB or lightheadedness with hypotension: No Has patient had a PCN reaction causing severe rash involving mucus membranes or skin necrosis: Yes Has patient had a PCN reaction that required hospitalization No Has patient had a PCN reaction occurring within the last 10 years: No If all of the above answers are "NO", then may proceed with Cephalosporin use.     Family History  Problem Relation Age of Onset  . Heart failure Mother   . Hypertension Mother   . Heart failure Sister   . Hypertension Father      Prior to Admission medications   Medication Sig Start Date  End Date Taking? Authorizing Provider  apixaban (ELIQUIS) 5 MG TABS tablet Take 5 mg by mouth 2 (two) times daily.   Yes Historical Provider, MD  atorvastatin (LIPITOR) 40 MG tablet Take 40 mg by mouth at bedtime.  11/02/15  Yes Historical Provider, MD  hydrALAZINE (APRESOLINE) 25 MG tablet Take 25 mg by mouth 2 (two) times daily. 11/02/15  Yes Historical  Provider, MD  LANTUS SOLOSTAR 100 UNIT/ML Solostar Pen Inject 30 Units into the skin at bedtime.  11/16/15  Yes Historical Provider, MD  losartan (COZAAR) 100 MG tablet Take 100 mg by mouth every morning.  11/02/15  Yes Historical Provider, MD  metFORMIN (GLUCOPHAGE) 1000 MG tablet Take 1,000 mg by mouth 2 (two) times daily with a meal.   Yes Historical Provider, MD  NIFEdipine (PROCARDIA-XL/ADALAT CC) 30 MG 24 hr tablet Take 30 mg by mouth daily.   Yes Historical Provider, MD  ranitidine (ZANTAC) 300 MG tablet Take 300 mg by mouth every morning.  11/02/15  Yes Historical Provider, MD    Physical Exam: Vitals:   02/22/17 2125 02/22/17 2200 02/22/17 2230 02/22/17 2300  BP: 159/65 147/63 144/79 (!) 132/109  Pulse: 104 94 97 93  Resp: 20 16 22 22   Temp: 101.2 F (38.4 C)     TempSrc: Oral     SpO2: 97% 97% 92% 96%  Weight: 93.4 kg (206 lb)     Height: 5\' 5"  (1.651 m)      Constitutional: NAD, calm, comfortable Vitals:   02/22/17 2125 02/22/17 2200 02/22/17 2230 02/22/17 2300  BP: 159/65 147/63 144/79 (!) 132/109  Pulse: 104 94 97 93  Resp: 20 16 22 22   Temp: 101.2 F (38.4 C)     TempSrc: Oral     SpO2: 97% 97% 92% 96%  Weight: 93.4 kg (206 lb)     Height: 5\' 5"  (1.651 m)      Eyes: PERRL, lids and conjunctivae normal ENMT: Mucous membranes are moist. Posterior pharynx clear of any exudate or lesions.Normal dentition.  Neck: normal, supple, no masses, no thyromegaly Respiratory: clear to auscultation bilaterally, no wheezing, no crackles. Normal respiratory effort. No accessory muscle use.  Cardiovascular: Regular rate and rhythm, no murmurs / rubs / gallops. No extremity edema. 2+ pedal pulses. No carotid bruits.  Abdomen: no tenderness, no masses palpated. No hepatosplenomegaly. Bowel sounds positive.  Musculoskeletal: no clubbing / cyanosis. No joint deformity upper and lower extremities. Good ROM, no contractures. Normal muscle tone.  Skin: no rashes, lesions, ulcers. No  induration Neurologic: CN 2-12 grossly intact. Sensation intact, DTR normal. Strength 5/5 in all 4.  Psychiatric: Normal judgment and insight. Alert and oriented x 3. Normal mood.    Labs on Admission: I have personally reviewed following labs and imaging studies CBC:  Recent Labs Lab 02/22/17 2141  WBC 18.0*  NEUTROABS 14.7*  HGB 12.3  HCT 37.4  MCV 90.1  PLT 123XX123   Basic Metabolic Panel:  Recent Labs Lab 02/22/17 2141  NA 136  K 3.4*  CL 102  CO2 24  GLUCOSE 242*  BUN 10  CREATININE 0.88  CALCIUM 8.6*   GFR: Estimated Creatinine Clearance: 63.4 mL/min (by C-G formula based on SCr of 0.88 mg/dL). Liver Function Tests:  Recent Labs Lab 02/22/17 2141  AST 20  ALT 11*  ALKPHOS 57  BILITOT 0.5  PROT 7.4  ALBUMIN 3.1*    Recent Labs Lab 02/22/17 2141  LIPASE 12   CBG:  Recent Labs Lab 02/22/17 2127  GLUCAP 262*   Urine analysis:    Component Value Date/Time   COLORURINE AMBER (A) 11/08/2016 1605   APPEARANCEUR CLOUDY (A) 11/08/2016 1605   LABSPEC 1.026 11/08/2016 1605   PHURINE 6.0 11/08/2016 1605   GLUCOSEU NEGATIVE 11/08/2016 1605   HGBUR TRACE (A) 11/08/2016 1605   BILIRUBINUR NEGATIVE 11/08/2016 1605   KETONESUR NEGATIVE 11/08/2016 1605   PROTEINUR NEGATIVE 11/08/2016 1605   UROBILINOGEN 0.2 06/18/2015 2300   NITRITE NEGATIVE 11/08/2016 1605   LEUKOCYTESUR TRACE (A) 11/08/2016 1605    Radiological Exams on Admission: Dg Chest 2 View  Result Date: 02/22/2017 CLINICAL DATA:  Cough.  Altered mental status. 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 Mild cardiomegaly and mediastinal contours are stable. There is atherosclerosis of the aortic arch. Patchy opacity in the left mid lung zone, new from prior. Right lung is clear. No pleural fluid or pneumothorax. There is degenerative change in the spine. IMPRESSION: 1. Patchy left midlung zone opacities  suspicious for pneumonia. Followup PA and lateral chest X-ray is recommended in 3-4 weeks following trial of antibiotic therapy to ensure resolution and exclude underlying malignancy. 2. Stable mild cardiomegaly and aortic atherosclerosis. Electronically Signed   By: Jeb Levering M.D.   On: 02/22/2017 22:43    EKG: Independently reviewed.   Assessment/Plan Principal Problem:   CAP (community acquired pneumonia) Active Problems:   Diabetes type 2, controlled ([place])   KNEE PAIN   Essential hypertension   Complete heart block (HCC)    PLAN:   CONFUSION:  Could be from her fever.  In any event, she is lucid now.  Will just follow.   CAP:  Will continue with IV Levoquin.  She is stable respiratory wise. HTN:  BP is controlled.  Will continue with home meds. DM:  Continue with her Lantus.  Reduce from 30 units to 20 units.  Moderate SSI, and continue Metformin.    DVT prophylaxis: Eliquis.  Code Status: FULL CODE.  Family Communication: Son at bedside.  Disposition Plan: Home when ready. Consults called: None. Admission status: OBS.    Corinda Ammon MD FACP. [facility]  If 7PM-7AM, please contact night-coverage [email or web address] Password [facility]  02/22/2017, 11:35 PM

## 2017-02-22 NOTE — ED Provider Notes (Signed)
Emergency Department Provider Note   I have reviewed the triage vital signs and the nursing notes.  By signing my name below, I, Natalie Castro, attest that this documentation has been prepared under the direction and in the presence of Natalie Fast, MD. Electronically signed, Natalie Castro, ED Scribe. 02/22/17. 9:43 PM.   HISTORY  Chief Complaint Altered Mental Status  HPI Comments: Natalie Castro is a 76 y.o. female BIB family who presents to the Emergency Department for complaints of altered mental status today. Pt is unsure why she is here. Her grandchildren reportedly note associated decreased appetite, confusion and fatigue. They state the pt became confused when checking her blood sugar today. The pt notes productive cough x 2-3 weeks. Pt reportedly taking prescribed Eliquis as advised at home. She denies SOB, chest pain, fever, dysuria, medications, any pain, falls or head trauma.  Level 5 caveat: Confusion    Past Medical History:  Diagnosis Date  . Acid reflux   . Arthritis   . Complete heart block (HCC)    STJ PPM Dr. [name] 11/24/15  . Essential hypertension   . Hyperlipidemia   . Presence of permanent cardiac pacemaker   . Type 2 diabetes mellitus [facility])     Patient Active Problem List   Diagnosis Date Noted  . CAP (community acquired pneumonia) 02/22/2017  . 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 Elevated troponin I level 11/04/2016  . Complete heart block ([place]) 11/23/2015  . Diabetes type 2, controlled ([place]) 07/20/2009  . KNEE, ARTHRITIS, 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  . KNEE PAIN 07/20/2009  . Essential hypertension 07/20/2009    Past Surgical History:  Procedure Laterality Date  . EP IMPLANTABLE DEVICE N/A 11/24/2015   Procedure: Pacemaker Implant;  Surgeon: [name], MD;  Location: [place] [facility];  Service: Cardiovascular;  Laterality: N/A;  . JOINT REPLACEMENT     knees bilat.  . TUBAL LIGATION        Allergies Penicillins  Family  History  Problem Relation Age of Onset  . Heart failure Mother   . Hypertension Mother   . Heart failure Sister   . Hypertension Father     Social History Social History  Substance Use Topics  . Smoking status: Never Smoker  . Smokeless tobacco: Never Used  . Alcohol use No    Review of Systems  10-point ROS otherwise negative.  ____________________________________________   PHYSICAL EXAM:  VITAL SIGNS: ED Triage Vitals [02/22/17 2125]  Enc Vitals Group     BP 159/65     Pulse Rate 104     Resp 20     Temp 101.2 F (38.4 C)     Temp Source Oral     SpO2 97 %     Weight 206 lb (93.4 kg)     Height 5\' 5"  (1.651 m)   Constitutional: Alert. Well appearing and in no acute distress. Eyes: Conjunctivae are normal. Head: Atraumatic. Nose: No congestion/rhinnorhea. Mouth/Throat: Mucous membranes are moist.  Oropharynx non-erythematous. Neck: No stridor.   Cardiovascular: Normal rate, regular rhythm. Good peripheral circulation. Grossly normal heart sounds.   Respiratory: Normal respiratory effort.  No retractions. Lungs with bilateral rhonchi.  Gastrointestinal: Soft and nontender. No distention.  Musculoskeletal: No lower extremity tenderness nor edema. No gross deformities of extremities. Neurologic:  Normal speech and language. No gross focal neurologic deficits are appreciated.  Skin:  Skin is warm, dry and intact. No rash noted.  ____________________________________________   DIAGNOSTIC STUDIES: Oxygen Saturation is 97%  on RA, normal by my interpretation.    COORDINATION OF CARE: 9:43 PM Discussed treatment plan with pt at bedside and pt agreed to plan. Will order labs and reassess. LABS (all labs ordered are listed, but only abnormal results are displayed)  Labs Reviewed  COMPREHENSIVE METABOLIC PANEL - Abnormal; Notable for the following:       Result Value   Potassium 3.4 (*)    Glucose, Bld 242 (*)    Calcium 8.6 (*)    Albumin 3.1 (*)    ALT 11 (*)     All other components within normal limits  CBC WITH DIFFERENTIAL/PLATELET - Abnormal; Notable for the following:    WBC 18.0 (*)    Neutro Abs 14.7 (*)    Monocytes Absolute 1.5 (*)    All other components within normal limits  URINALYSIS, ROUTINE W REFLEX MICROSCOPIC - Abnormal; Notable for the following:    APPearance HAZY (*)    Glucose, UA 50 (*)    Hgb urine dipstick SMALL (*)    Protein, ur 30 (*)    Leukocytes, UA SMALL (*)    Bacteria, UA RARE (*)    All other components within normal limits  TSH - Abnormal; Notable for the following:    TSH 0.150 (*)    All other components within normal limits  BASIC METABOLIC PANEL - Abnormal; Notable for the following:    Potassium 3.2 (*)    Glucose, Bld 132 (*)    Calcium 8.2 (*)    All other components within normal limits  CBC - Abnormal; Notable for the following:    WBC 15.8 (*)    RBC 3.81 (*)    Hemoglobin 11.2 (*)    HCT 34.6 (*)    All other components within normal limits  GLUCOSE, CAPILLARY - Abnormal; Notable for the following:    Glucose-Capillary 228 (*)    All other components within normal limits  GLUCOSE, CAPILLARY - Abnormal; Notable for the following:    Glucose-Capillary 109 (*)    All other components within normal limits  GLUCOSE, CAPILLARY - Abnormal; Notable for the following:    Glucose-Capillary 158 (*)    All other components within normal limits  CBG MONITORING, ED - Abnormal; Notable for the following:    Glucose-Capillary 262 (*)    All other components within normal limits  CULTURE, BLOOD (ROUTINE X 2)  CULTURE, BLOOD (ROUTINE X 2)  LIPASE, BLOOD  LACTIC ACID, PLASMA   ____________________________________________  EKG   EKG Interpretation  Date/Time:  Wednesday February 22 2017 21:46:41 EST Ventricular Rate:  94 PR Interval:    QRS Duration: 143 QT Interval:  400 QTC Calculation: 501 R Axis:   147 Text Interpretation:  Sinus rhythm Nonspecific intraventricular conduction delay  Anterolateral infarct, recent No STEMI.  Confirmed by Deaun Rocha MD, Bernise Sylvain [phone]) on 02/22/2017 10:47:38 PM       ____________________________________________  RADIOLOGY  Dg Chest 2 View  Result Date: 02/22/2017 CLINICAL DATA:  Cough.  Altered mental status. 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 Mild cardiomegaly and mediastinal contours are stable. There is atherosclerosis of the aortic arch. Patchy opacity in the left mid lung zone, new from prior. Right lung is clear. No pleural fluid or pneumothorax. There is degenerative change in the spine. IMPRESSION: 1. Patchy left midlung zone opacities suspicious for pneumonia. Followup PA and lateral chest X-ray is recommended in 3-4 weeks  following trial of antibiotic therapy to ensure resolution and exclude underlying malignancy. 2. Stable mild cardiomegaly and aortic atherosclerosis. Electronically Signed   By: Jeb Levering M.D.   On: 02/22/2017 22:43    ____________________________________________   PROCEDURES  Procedure(s) performed:   Procedures  None ____________________________________________   INITIAL IMPRESSION / ASSESSMENT AND PLAN / ED COURSE  Pertinent labs & imaging results that were available during my care of the patient were reviewed by me and considered in my medical decision making (see chart for details).  Patient presents to the ED with confusion and fever and is subsequently found to have PNA. Patient meeting Sepsis criteria shortly after arrival. Normal lactate. Started abx and IVF. No hypoxemia but confused, weak, and having difficulty with ADLs.   Discussed patient's case with hospitalist, Dr. Marin Comment.  Patient and family (if present) updated with plan. Care transferred to Hospitalist service.  I reviewed all nursing notes, vitals, pertinent old records, EKGs, labs, imaging (as  available).  ____________________________________________  FINAL CLINICAL IMPRESSION(S) / ED DIAGNOSES  Final diagnoses:  Community acquired pneumonia, unspecified laterality  Disorientation     MEDICATIONS GIVEN DURING THIS VISIT:  Medications  NIFEdipine (PROCARDIA-XL/ADALAT-CC/NIFEDICAL-XL) 24 hr tablet 30 mg (30 mg Oral Given 02/23/17 0929)  apixaban (ELIQUIS) tablet 5 mg (5 mg Oral Given 02/23/17 0929)  atorvastatin (LIPITOR) tablet 40 mg (40 mg Oral Given 02/23/17 0128)  hydrALAZINE (APRESOLINE) tablet 25 mg (25 mg Oral Given 02/23/17 0929)  losartan (COZAAR) tablet 100 mg (100 mg Oral Given 02/23/17 0929)  famotidine (PEPCID) tablet 20 mg (20 mg Oral Given 02/23/17 0929)  metFORMIN (GLUCOPHAGE) tablet 1,000 mg (1,000 mg Oral Given 02/23/17 0929)  acetaminophen (TYLENOL) tablet 650 mg (650 mg Oral Given 02/23/17 0933)    Or  acetaminophen (TYLENOL) suppository 650 mg ( Rectal See Alternative 02/23/17 0933)  levofloxacin (LEVAQUIN) IVPB 750 mg (not administered)  pneumococcal 23 valent vaccine (PNU-IMMUNE) injection 0.5 mL (not administered)  insulin aspart (novoLOG) injection 0-5 Units (2 Units Subcutaneous Given 02/23/17 0154)  insulin aspart (novoLOG) injection 0-15 Units (3 Units Subcutaneous Given 02/23/17 1319)  insulin glargine (LANTUS) injection 20 Units (20 Units Subcutaneous Given 02/23/17 0155)  potassium chloride SA (K-DUR,KLOR-CON) CR tablet 40 mEq (40 mEq Oral Given 02/23/17 0929)  sodium chloride 0.9 % bolus 500 mL (500 mLs Intravenous New Bag/Given 02/22/17 2343)  levofloxacin (LEVAQUIN) IVPB 750 mg (750 mg Intravenous New Bag/Given 02/22/17 2343)     NEW OUTPATIENT MEDICATIONS STARTED DURING THIS VISIT:  None   Note:  This document was prepared using Dragon voice recognition software and may include unintentional dictation errors.  Natalie Quinton, MD Emergency Medicine   I personally performed the services described in this documentation, which was scribed in my presence. The  recorded information has been reviewed and is accurate.     Natalie Fast, MD 02/23/17 [phone]

## 2017-02-22 NOTE — ED Triage Notes (Signed)
Pt c/o not feeling well; pt's granddaughter states pt has been acting tired and confused today; pt's cbg has been elevated today with last one at 2030 at 281

## 2017-02-23 ENCOUNTER — Encounter (HOSPITAL_COMMUNITY): Payer: Self-pay | Admitting: *Deleted

## 2017-02-23 LAB — CBC
HEMATOCRIT: 34.6 % — AB (ref 36.0–46.0)
HEMOGLOBIN: 11.2 g/dL — AB (ref 12.0–15.0)
MCH: 29.4 pg (ref 26.0–34.0)
MCHC: 32.4 g/dL (ref 30.0–36.0)
MCV: 90.8 fL (ref 78.0–100.0)
Platelets: 190 10*3/uL (ref 150–400)
RBC: 3.81 MIL/uL — ABNORMAL LOW (ref 3.87–5.11)
RDW: 14.2 % (ref 11.5–15.5)
WBC: 15.8 10*3/uL — ABNORMAL HIGH (ref 4.0–10.5)

## 2017-02-23 LAB — BASIC METABOLIC PANEL
ANION GAP: 9 (ref 5–15)
BUN: 12 mg/dL (ref 6–20)
CO2: 26 mmol/L (ref 22–32)
Calcium: 8.2 mg/dL — ABNORMAL LOW (ref 8.9–10.3)
Chloride: 104 mmol/L (ref 101–111)
Creatinine, Ser: 0.78 mg/dL (ref 0.44–1.00)
GFR calc Af Amer: >60 mL/min (ref >60)
GFR calc non Af Amer: >60 mL/min (ref >60)
GLUCOSE: 132 mg/dL — AB (ref 65–99)
Potassium: 3.2 mmol/L — ABNORMAL LOW (ref 3.5–5.1)
Sodium: 139 mmol/L (ref 135–145)

## 2017-02-23 LAB — GLUCOSE, CAPILLARY
GLUCOSE-CAPILLARY: 138 mg/dL — AB (ref 65–99)
Glucose-Capillary: 109 mg/dL — ABNORMAL HIGH (ref 65–99)
Glucose-Capillary: 158 mg/dL — ABNORMAL HIGH (ref 65–99)
Glucose-Capillary: 168 mg/dL — ABNORMAL HIGH (ref 65–99)
Glucose-Capillary: 228 mg/dL — ABNORMAL HIGH (ref 65–99)

## 2017-02-23 LAB — URINALYSIS, ROUTINE W REFLEX MICROSCOPIC
Bilirubin Urine: NEGATIVE
Glucose, UA: 50 mg/dL — AB
Ketones, ur: NEGATIVE mg/dL
Nitrite: NEGATIVE
PROTEIN: 30 mg/dL — AB
SPECIFIC GRAVITY, URINE: 1.014 (ref 1.005–1.030)
pH: 7 (ref 5.0–8.0)

## 2017-02-23 LAB — TSH: TSH: 0.15 u[IU]/mL — ABNORMAL LOW (ref 0.350–4.500)

## 2017-02-23 MED ORDER — PNEUMOCOCCAL VAC POLYVALENT 25 MCG/0.5ML IJ INJ
0.5000 mL | INJECTION | INTRAMUSCULAR | Status: AC
  Administered 2017-02-24: 0.5 mL via INTRAMUSCULAR
  Filled 2017-02-23: qty 0.5

## 2017-02-23 MED ORDER — ONDANSETRON HCL 4 MG/2ML IJ SOLN: 4.0000 mg | Freq: Four times a day (QID) | INTRAMUSCULAR | Status: DC | PRN

## 2017-02-23 MED ORDER — ONDANSETRON HCL 4 MG PO TABS
4.0000 mg | ORAL_TABLET | Freq: Four times a day (QID) | ORAL | Status: DC | PRN
Start: 2017-02-23 — End: 2017-02-23

## 2017-02-23 MED ORDER — INSULIN ASPART 100 UNIT/ML ~~LOC~~ SOLN
0.0000 [IU] | Freq: Three times a day (TID) | SUBCUTANEOUS | Status: DC
  Administered 2017-02-23: 2 [IU] via SUBCUTANEOUS
  Administered 2017-02-23: 3 [IU] via SUBCUTANEOUS
  Administered 2017-02-24: 2 [IU] via SUBCUTANEOUS
  Administered 2017-02-25: 3 [IU] via SUBCUTANEOUS
  Administered 2017-02-25: 2 [IU] via SUBCUTANEOUS

## 2017-02-23 MED ORDER — LOSARTAN POTASSIUM 50 MG PO TABS
100.0000 mg | ORAL_TABLET | Freq: Every morning | ORAL | Status: DC
  Administered 2017-02-23 – 2017-02-26 (×4): 100 mg via ORAL
  Filled 2017-02-23 (×4): qty 2

## 2017-02-23 MED ORDER — METFORMIN HCL 500 MG PO TABS
1000.0000 mg | ORAL_TABLET | Freq: Two times a day (BID) | ORAL | Status: DC
  Administered 2017-02-23 – 2017-02-26 (×7): 1000 mg via ORAL
  Filled 2017-02-23 (×7): qty 2

## 2017-02-23 MED ORDER — APIXABAN 5 MG PO TABS
5.0000 mg | ORAL_TABLET | Freq: Two times a day (BID) | ORAL | Status: DC
  Administered 2017-02-23 – 2017-02-26 (×8): 5 mg via ORAL
  Filled 2017-02-23 (×8): qty 1

## 2017-02-23 MED ORDER — ATORVASTATIN CALCIUM 40 MG PO TABS
40.0000 mg | ORAL_TABLET | Freq: Every day | ORAL | Status: DC
  Administered 2017-02-23 – 2017-02-25 (×4): 40 mg via ORAL
  Filled 2017-02-23 (×4): qty 1

## 2017-02-23 MED ORDER — ACETAMINOPHEN 650 MG RE SUPP: 650.0000 mg | Freq: Four times a day (QID) | RECTAL | Status: DC | PRN

## 2017-02-23 MED ORDER — HYDRALAZINE HCL 25 MG PO TABS
25.0000 mg | ORAL_TABLET | Freq: Two times a day (BID) | ORAL | Status: DC
  Administered 2017-02-23 – 2017-02-26 (×8): 25 mg via ORAL
  Filled 2017-02-23 (×8): qty 1

## 2017-02-23 MED ORDER — INSULIN ASPART 100 UNIT/ML ~~LOC~~ SOLN
0.0000 [IU] | Freq: Every day | SUBCUTANEOUS | Status: DC
  Administered 2017-02-23: 2 [IU] via SUBCUTANEOUS

## 2017-02-23 MED ORDER — NIFEDIPINE ER OSMOTIC RELEASE 30 MG PO TB24
30.0000 mg | ORAL_TABLET | Freq: Every day | ORAL | Status: DC
Start: 2017-02-23 — End: 2017-02-26
  Administered 2017-02-23 – 2017-02-26 (×4): 30 mg via ORAL
  Filled 2017-02-23 (×4): qty 1

## 2017-02-23 MED ORDER — LEVOFLOXACIN IN D5W 750 MG/150ML IV SOLN
750.0000 mg | INTRAVENOUS | Status: DC
  Administered 2017-02-23 – 2017-02-25 (×3): 750 mg via INTRAVENOUS
  Filled 2017-02-23 (×3): qty 150

## 2017-02-23 MED ORDER — FAMOTIDINE 20 MG PO TABS
20.0000 mg | ORAL_TABLET | Freq: Every day | ORAL | Status: DC
  Administered 2017-02-23 – 2017-02-26 (×4): 20 mg via ORAL
  Filled 2017-02-23 (×4): qty 1

## 2017-02-23 MED ORDER — ACETAMINOPHEN 325 MG PO TABS
650.0000 mg | ORAL_TABLET | Freq: Four times a day (QID) | ORAL | Status: DC | PRN
  Administered 2017-02-23 – 2017-02-25 (×7): 650 mg via ORAL
  Filled 2017-02-23 (×7): qty 2

## 2017-02-23 MED ORDER — INSULIN GLARGINE 100 UNIT/ML ~~LOC~~ SOLN
20.0000 [IU] | Freq: Every day | SUBCUTANEOUS | Status: DC
Start: 2017-02-23 — End: 2017-02-26
  Administered 2017-02-23 – 2017-02-25 (×4): 20 [IU] via SUBCUTANEOUS
  Filled 2017-02-23 (×5): qty 0.2

## 2017-02-23 MED ORDER — POTASSIUM CHLORIDE CRYS ER 20 MEQ PO TBCR
40.0000 meq | EXTENDED_RELEASE_TABLET | Freq: Two times a day (BID) | ORAL | Status: AC
  Administered 2017-02-23 (×2): 40 meq via ORAL
  Filled 2017-02-23 (×2): qty 2

## 2017-02-23 NOTE — ACP (Advance Care Planning) (Signed)
Gave Advance Directive information to Ms Journigan. Will follow up on completion on 02/24/17.

## 2017-02-23 NOTE — Care Management Note (Signed)
Case Management Note  Patient Details  Name: GLEMA COMFORT MRN: [id number] Date of Birth: 03/26/1941  Subjective/Objective:   Patient adm from home with CAP. Currently staying with daughter while sick. Usually lives with husband. Ind with ADL's, walks with a rollator. No current home health. Plans to return home at DC.                  Action/Plan: Anticipate DC home with self care. Will follow for needs.    Expected Discharge Date:    02/25/2017              Expected Discharge Plan:  Home/Self Care  In-House Referral:     Discharge planning Services  CM Consult  Post Acute Care Choice:  NA Choice offered to:  NA  DME Arranged:    DME Agency:     HH Arranged:    HH Agency:     Status of Service:  In process, will continue to follow  If discussed at Long Length of Stay Meetings, dates discussed:    Additional Comments:  Phylliss Strege, Chauncey Reading, RN 02/23/2017, 12:09 PM

## 2017-02-23 NOTE — Progress Notes (Signed)
Subjective: This is a 76  Years old female who was brought to ER due to recurrent cough and confusion. She was found leucocytosis and patchy infiltrate on chest x-ray. She was admitted as case of pneumonia and was started on IV antibiotics. Patient feels much better this morning.  Objective: Vital signs in last 24 hours: Temp:  [99.9 F (37.7 C)-101.2 F (38.4 C)] 99.9 F (37.7 C) (03/01 0553) Pulse Rate:  [87-104] 89 (03/01 0553) Resp:  [16-22] 16 (03/01 0553) BP: (132-159)/(63-109) 138/70 (03/01 0553) SpO2:  [92 %-100 %] 98 % (03/01 0553) Weight:  [93.4 kg (206 lb)] 93.4 kg (206 lb) (02/28 2125) Weight change:  Last BM Date: 02/22/17  Intake/Output from previous day: 02/28 0701 - 03/01 0700 In: 240 [P.O.:240] Out: 200 [Urine:200]  PHYSICAL EXAM General appearance: alert and no distress Resp: diminished breath sounds bilaterally and rhonchi bilaterally Cardio: S1, S2 normal GI: soft, non-tender; bowel sounds normal; no masses,  no organomegaly Extremities: extremities normal, atraumatic, no cyanosis or edema  Lab Results:  Results for orders placed or performed during the hospital encounter of 02/22/17 (from the past 48 hour(s))  POC CBG, ED     Status: Abnormal   Collection Time: 02/22/17  9:27 PM  Result Value Ref Range   Glucose-Capillary 262 (H) 65 - 99 mg/dL  Comprehensive metabolic panel     Status: Abnormal   Collection Time: 02/22/17  9:41 PM  Result Value Ref Range   Sodium 136 135 - 145 mmol/L   Potassium 3.4 (L) 3.5 - 5.1 mmol/L   Chloride 102 101 - 111 mmol/L   CO2 24 22 - 32 mmol/L   Glucose, Bld 242 (H) 65 - 99 mg/dL   BUN 10 6 - 20 mg/dL   Creatinine, Ser 0.88 0.44 - 1.00 mg/dL   Calcium 8.6 (L) 8.9 - 10.3 mg/dL   Total Protein 7.4 6.5 - 8.1 g/dL   Albumin 3.1 (L) 3.5 - 5.0 g/dL   AST 20 15 - 41 U/L   ALT 11 (L) 14 - 54 U/L   Alkaline Phosphatase 57 38 - 126 U/L   Total Bilirubin 0.5 0.3 - 1.2 mg/dL   GFR calc non Af Amer >60 >60 mL/min   GFR calc  Af Amer >60 >60 mL/min    Comment: (NOTE) The eGFR has been calculated using the CKD EPI equation. This calculation has not been validated in all clinical situations. eGFR's persistently <60 mL/min signify possible Chronic Kidney Disease.    Anion gap 10 5 - 15  Lipase, blood     Status: None   Collection Time: 02/22/17  9:41 PM  Result Value Ref Range   Lipase 12 11 - 51 U/L  CBC with Differential     Status: Abnormal   Collection Time: 02/22/17  9:41 PM  Result Value Ref Range   WBC 18.0 (H) 4.0 - 10.5 K/uL   RBC 4.15 3.87 - 5.11 MIL/uL   Hemoglobin 12.3 12.0 - 15.0 g/dL   HCT 37.4 36.0 - 46.0 %   MCV 90.1 78.0 - 100.0 fL   MCH 29.6 26.0 - 34.0 pg   MCHC 32.9 30.0 - 36.0 g/dL   RDW 14.1 11.5 - 15.5 %   Platelets 208 150 - 400 K/uL   Neutrophils Relative % 82 %   Neutro Abs 14.7 (H) 1.7 - 7.7 K/uL   Lymphocytes Relative 10 %   Lymphs Abs 1.9 0.7 - 4.0 K/uL   Monocytes Relative 8 %  Monocytes Absolute 1.5 (H) 0.1 - 1.0 K/uL   Eosinophils Relative 0 %   Eosinophils Absolute 0.0 0.0 - 0.7 K/uL   Basophils Relative 0 %   Basophils Absolute 0.0 0.0 - 0.1 K/uL  Urinalysis, Routine w reflex microscopic     Status: Abnormal   Collection Time: 02/22/17  9:41 PM  Result Value Ref Range   Color, Urine YELLOW YELLOW   APPearance HAZY (A) CLEAR   Specific Gravity, Urine 1.014 1.005 - 1.030   pH 7.0 5.0 - 8.0   Glucose, UA 50 (A) NEGATIVE mg/dL   Hgb urine dipstick SMALL (A) NEGATIVE   Bilirubin Urine NEGATIVE NEGATIVE   Ketones, ur NEGATIVE NEGATIVE mg/dL   Protein, ur 30 (A) NEGATIVE mg/dL   Nitrite NEGATIVE NEGATIVE   Leukocytes, UA SMALL (A) NEGATIVE   RBC / HPF 0-5 0 - 5 RBC/hpf   WBC, UA 6-30 0 - 5 WBC/hpf   Bacteria, UA RARE (A) NONE SEEN  Blood Culture (routine x 2)     Status: None (Preliminary result)   Collection Time: 02/22/17 11:01 PM  Result Value Ref Range   Specimen Description LEFT ANTECUBITAL    Special Requests BOTTLES DRAWN AEROBIC AND ANAEROBIC [place]     Culture PENDING    Report Status PENDING   Lactic acid, plasma     Status: None   Collection Time: 02/22/17 11:01 PM  Result Value Ref Range   Lactic Acid, Venous 1.3 0.5 - 1.9 mmol/L  Blood Culture (routine x 2)     Status: None (Preliminary result)   Collection Time: 02/22/17 11:12 PM  Result Value Ref Range   Specimen Description BLOOD LEFT HAND    Special Requests BOTTLES DRAWN AEROBIC AND ANAEROBIC 6CC    Culture PENDING    Report Status PENDING   TSH     Status: Abnormal   Collection Time: 02/22/17 11:12 PM  Result Value Ref Range   TSH 0.150 (L) 0.350 - 4.500 uIU/mL    Comment: Performed by a 3rd Generation assay with a functional sensitivity of <=0.01 uIU/mL.  Glucose, capillary     Status: Abnormal   Collection Time: 02/23/17 12:55 AM  Result Value Ref Range   Glucose-Capillary 228 (H) 65 - 99 mg/dL   Comment 1 Notify RN    Comment 2 Document in Chart   Basic metabolic panel     Status: Abnormal   Collection Time: 02/23/17  5:30 AM  Result Value Ref Range   Sodium 139 135 - 145 mmol/L   Potassium 3.2 (L) 3.5 - 5.1 mmol/L   Chloride 104 101 - 111 mmol/L   CO2 26 22 - 32 mmol/L   Glucose, Bld 132 (H) 65 - 99 mg/dL   BUN 12 6 - 20 mg/dL   Creatinine, Ser 0.78 0.44 - 1.00 mg/dL   Calcium 8.2 (L) 8.9 - 10.3 mg/dL   GFR calc non Af Amer >60 >60 mL/min   GFR calc Af Amer >60 >60 mL/min    Comment: (NOTE) The eGFR has been calculated using the CKD EPI equation. This calculation has not been validated in all clinical situations. eGFR's persistently <60 mL/min signify possible Chronic Kidney Disease.    Anion gap 9 5 - 15  CBC     Status: Abnormal   Collection Time: 02/23/17  5:30 AM  Result Value Ref Range   WBC 15.8 (H) 4.0 - 10.5 K/uL   RBC 3.81 (L) 3.87 - 5.11 MIL/uL   Hemoglobin 11.2 (L)  12.0 - 15.0 g/dL   HCT 34.6 (L) 36.0 - 46.0 %   MCV 90.8 78.0 - 100.0 fL   MCH 29.4 26.0 - 34.0 pg   MCHC 32.4 30.0 - 36.0 g/dL   RDW 14.2 11.5 - 15.5 %   Platelets 190 150  - 400 K/uL  Glucose, capillary     Status: Abnormal   Collection Time: 02/23/17  7:56 AM  Result Value Ref Range   Glucose-Capillary 109 (H) 65 - 99 mg/dL   Comment 1 Notify RN    Comment 2 Document in Chart     ABGS No results for input(s): PHART, PO2ART, TCO2, HCO3 in the last 72 hours.  Invalid input(s): PCO2 CULTURES Recent Results (from the past 240 hour(s))  Blood Culture (routine x 2)     Status: None (Preliminary result)   Collection Time: 02/22/17 11:01 PM  Result Value Ref Range Status   Specimen Description LEFT ANTECUBITAL  Final   Special Requests BOTTLES DRAWN AEROBIC AND ANAEROBIC [place]  Final   Culture PENDING  Incomplete   Report Status PENDING  Incomplete  Blood Culture (routine x 2)     Status: None (Preliminary result)   Collection Time: 02/22/17 11:12 PM  Result Value Ref Range Status   Specimen Description BLOOD LEFT HAND  Final   Special Requests BOTTLES DRAWN AEROBIC AND ANAEROBIC 6CC  Final   Culture PENDING  Incomplete   Report Status PENDING  Incomplete   Studies/Results: Dg Chest 2 View  Result Date: 02/22/2017 CLINICAL DATA:  Cough.  Altered mental status. 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 Mild cardiomegaly and mediastinal contours are stable. There is atherosclerosis of the aortic arch. Patchy opacity in the left mid lung zone, new from prior. Right lung is clear. No pleural fluid or pneumothorax. There is degenerative change in the spine. IMPRESSION: 1. Patchy left midlung zone opacities suspicious for pneumonia. Followup PA and lateral chest X-ray is recommended in 3-4 weeks following trial of antibiotic therapy to ensure resolution and exclude underlying malignancy. 2. Stable mild cardiomegaly and aortic atherosclerosis. Electronically Signed   By: Jeb Levering M.D.   On: 02/22/2017 22:43    Medications: I have reviewed the patient's current  medications.  Assesment:  Principal Problem:   CAP (community acquired pneumonia) Active Problems:   Diabetes type 2, controlled ([place])   KNEE PAIN   Essential hypertension   Complete heart block ([place])    Plan:  Medications reviewed Continue IV antibiotics Will supplement K+ Continue regular treatment.    LOS: 1 day   Granger Chui 02/23/2017, 8:17 AM

## 2017-02-24 LAB — GLUCOSE, CAPILLARY
GLUCOSE-CAPILLARY: 136 mg/dL — AB (ref 65–99)
Glucose-Capillary: 98 mg/dL (ref 65–99)
Glucose-Capillary: 108 mg/dL — ABNORMAL HIGH (ref 65–99)
Glucose-Capillary: 191 mg/dL — ABNORMAL HIGH (ref 65–99)

## 2017-02-24 NOTE — Care Management Important Message (Signed)
Important Message  Patient Details  Name: LACEY VENDER MRN: [id number] Date of Birth: 06/21/1941   Medicare Important Message Given:  Yes    Sherald Barge, RN 02/24/2017, 9:33 AM

## 2017-02-24 NOTE — Progress Notes (Signed)
Subjective: Patient is more alert and awake. Her po intake is improving. She feels better. She is coughing intermittently but no shortness of breath.  Objective: Vital signs in last 24 hours: Temp:  [99.1 F (37.3 C)-102 F (38.9 C)] 99.7 F (37.6 C) (03/02 0551) Pulse Rate:  [78-105] 78 (03/02 0551) Resp:  [18] 18 (03/02 0551) BP: (131-170)/(59-101) 131/101 (03/02 0551) SpO2:  [97 %-99 %] 97 % (03/02 0551) Weight change:  Last BM Date: 02/23/17  Intake/Output from previous day: 03/01 0701 - 03/02 0700 In: 390 [P.O.:240; IV Piggyback:150] Out: 750 [Urine:750]  PHYSICAL EXAM General appearance: alert and no distress Resp: diminished breath sounds bilaterally and rhonchi bilaterally Cardio: S1, S2 normal GI: soft, non-tender; bowel sounds normal; no masses,  no organomegaly Extremities: extremities normal, atraumatic, no cyanosis or edema  Lab Results:  Results for orders placed or performed during the hospital encounter of 02/22/17 (from the past 48 hour(s))  POC CBG, ED     Status: Abnormal   Collection Time: 02/22/17  9:27 PM  Result Value Ref Range   Glucose-Capillary 262 (H) 65 - 99 mg/dL  Comprehensive metabolic panel     Status: Abnormal   Collection Time: 02/22/17  9:41 PM  Result Value Ref Range   Sodium 136 135 - 145 mmol/L   Potassium 3.4 (L) 3.5 - 5.1 mmol/L   Chloride 102 101 - 111 mmol/L   CO2 24 22 - 32 mmol/L   Glucose, Bld 242 (H) 65 - 99 mg/dL   BUN 10 6 - 20 mg/dL   Creatinine, Ser 0.88 0.44 - 1.00 mg/dL   Calcium 8.6 (L) 8.9 - 10.3 mg/dL   Total Protein 7.4 6.5 - 8.1 g/dL   Albumin 3.1 (L) 3.5 - 5.0 g/dL   AST 20 15 - 41 U/L   ALT 11 (L) 14 - 54 U/L   Alkaline Phosphatase 57 38 - 126 U/L   Total Bilirubin 0.5 0.3 - 1.2 mg/dL   GFR calc non Af Amer >60 >60 mL/min   GFR calc Af Amer >60 >60 mL/min    Comment: (NOTE) The eGFR has been calculated using the CKD EPI equation. This calculation has not been validated in all clinical  situations. eGFR's persistently <60 mL/min signify possible Chronic Kidney Disease.    Anion gap 10 5 - 15  Lipase, blood     Status: None   Collection Time: 02/22/17  9:41 PM  Result Value Ref Range   Lipase 12 11 - 51 U/L  CBC with Differential     Status: Abnormal   Collection Time: 02/22/17  9:41 PM  Result Value Ref Range   WBC 18.0 (H) 4.0 - 10.5 K/uL   RBC 4.15 3.87 - 5.11 MIL/uL   Hemoglobin 12.3 12.0 - 15.0 g/dL   HCT 37.4 36.0 - 46.0 %   MCV 90.1 78.0 - 100.0 fL   MCH 29.6 26.0 - 34.0 pg   MCHC 32.9 30.0 - 36.0 g/dL   RDW 14.1 11.5 - 15.5 %   Platelets 208 150 - 400 K/uL   Neutrophils Relative % 82 %   Neutro Abs 14.7 (H) 1.7 - 7.7 K/uL   Lymphocytes Relative 10 %   Lymphs Abs 1.9 0.7 - 4.0 K/uL   Monocytes Relative 8 %   Monocytes Absolute 1.5 (H) 0.1 - 1.0 K/uL   Eosinophils Relative 0 %   Eosinophils Absolute 0.0 0.0 - 0.7 K/uL   Basophils Relative 0 %   Basophils Absolute 0.0  0.0 - 0.1 K/uL  Urinalysis, Routine w reflex microscopic     Status: Abnormal   Collection Time: 02/22/17  9:41 PM  Result Value Ref Range   Color, Urine YELLOW YELLOW   APPearance HAZY (A) CLEAR   Specific Gravity, Urine 1.014 1.005 - 1.030   pH 7.0 5.0 - 8.0   Glucose, UA 50 (A) NEGATIVE mg/dL   Hgb urine dipstick SMALL (A) NEGATIVE   Bilirubin Urine NEGATIVE NEGATIVE   Ketones, ur NEGATIVE NEGATIVE mg/dL   Protein, ur 30 (A) NEGATIVE mg/dL   Nitrite NEGATIVE NEGATIVE   Leukocytes, UA SMALL (A) NEGATIVE   RBC / HPF 0-5 0 - 5 RBC/hpf   WBC, UA 6-30 0 - 5 WBC/hpf   Bacteria, UA RARE (A) NONE SEEN  Blood Culture (routine x 2)     Status: None (Preliminary result)   Collection Time: 02/22/17 11:01 PM  Result Value Ref Range   Specimen Description LEFT ANTECUBITAL    Special Requests BOTTLES DRAWN AEROBIC AND ANAEROBIC [place]    Culture NO GROWTH < 12 HOURS    Report Status PENDING   Lactic acid, plasma     Status: None   Collection Time: 02/22/17 11:01 PM  Result Value Ref Range    Lactic Acid, Venous 1.3 0.5 - 1.9 mmol/L  Blood Culture (routine x 2)     Status: None (Preliminary result)   Collection Time: 02/22/17 11:12 PM  Result Value Ref Range   Specimen Description BLOOD LEFT HAND    Special Requests BOTTLES DRAWN AEROBIC AND ANAEROBIC 6CC    Culture NO GROWTH < 12 HOURS    Report Status PENDING   TSH     Status: Abnormal   Collection Time: 02/22/17 11:12 PM  Result Value Ref Range   TSH 0.150 (L) 0.350 - 4.500 uIU/mL    Comment: Performed by a 3rd Generation assay with a functional sensitivity of <=0.01 uIU/mL.  Glucose, capillary     Status: Abnormal   Collection Time: 02/23/17 12:55 AM  Result Value Ref Range   Glucose-Capillary 228 (H) 65 - 99 mg/dL   Comment 1 Notify RN    Comment 2 Document in Chart   Basic metabolic panel     Status: Abnormal   Collection Time: 02/23/17  5:30 AM  Result Value Ref Range   Sodium 139 135 - 145 mmol/L   Potassium 3.2 (L) 3.5 - 5.1 mmol/L   Chloride 104 101 - 111 mmol/L   CO2 26 22 - 32 mmol/L   Glucose, Bld 132 (H) 65 - 99 mg/dL   BUN 12 6 - 20 mg/dL   Creatinine, Ser 0.78 0.44 - 1.00 mg/dL   Calcium 8.2 (L) 8.9 - 10.3 mg/dL   GFR calc non Af Amer >60 >60 mL/min   GFR calc Af Amer >60 >60 mL/min    Comment: (NOTE) The eGFR has been calculated using the CKD EPI equation. This calculation has not been validated in all clinical situations. eGFR's persistently <60 mL/min signify possible Chronic Kidney Disease.    Anion gap 9 5 - 15  CBC     Status: Abnormal   Collection Time: 02/23/17  5:30 AM  Result Value Ref Range   WBC 15.8 (H) 4.0 - 10.5 K/uL   RBC 3.81 (L) 3.87 - 5.11 MIL/uL   Hemoglobin 11.2 (L) 12.0 - 15.0 g/dL   HCT 34.6 (L) 36.0 - 46.0 %   MCV 90.8 78.0 - 100.0 fL   MCH 29.4 26.0 -  34.0 pg   MCHC 32.4 30.0 - 36.0 g/dL   RDW 14.2 11.5 - 15.5 %   Platelets 190 150 - 400 K/uL  Glucose, capillary     Status: Abnormal   Collection Time: 02/23/17  7:56 AM  Result Value Ref Range    Glucose-Capillary 109 (H) 65 - 99 mg/dL   Comment 1 Notify RN    Comment 2 Document in Chart   Glucose, capillary     Status: Abnormal   Collection Time: 02/23/17 12:08 PM  Result Value Ref Range   Glucose-Capillary 158 (H) 65 - 99 mg/dL   Comment 1 Notify RN    Comment 2 Document in Chart   Glucose, capillary     Status: Abnormal   Collection Time: 02/23/17  5:36 PM  Result Value Ref Range   Glucose-Capillary 138 (H) 65 - 99 mg/dL   Comment 1 Notify RN    Comment 2 Document in Chart   Glucose, capillary     Status: Abnormal   Collection Time: 02/23/17  9:34 PM  Result Value Ref Range   Glucose-Capillary 168 (H) 65 - 99 mg/dL   Comment 1 Notify RN    Comment 2 Document in Chart   Glucose, capillary     Status: None   Collection Time: 02/24/17  7:41 AM  Result Value Ref Range   Glucose-Capillary 98 65 - 99 mg/dL    ABGS No results for input(s): PHART, PO2ART, TCO2, HCO3 in the last 72 hours.  Invalid input(s): PCO2 CULTURES Recent Results (from the past 240 hour(s))  Blood Culture (routine x 2)     Status: None (Preliminary result)   Collection Time: 02/22/17 11:01 PM  Result Value Ref Range Status   Specimen Description LEFT ANTECUBITAL  Final   Special Requests BOTTLES DRAWN AEROBIC AND ANAEROBIC [place]  Final   Culture NO GROWTH < 12 HOURS  Final   Report Status PENDING  Incomplete  Blood Culture (routine x 2)     Status: None (Preliminary result)   Collection Time: 02/22/17 11:12 PM  Result Value Ref Range Status   Specimen Description BLOOD LEFT HAND  Final   Special Requests BOTTLES DRAWN AEROBIC AND ANAEROBIC 6CC  Final   Culture NO GROWTH < 12 HOURS  Final   Report Status PENDING  Incomplete   Studies/Results: Dg Chest 2 View  Result Date: 02/22/2017 CLINICAL DATA:  Cough.  Altered mental status. 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 Mild cardiomegaly and mediastinal  contours are stable. There is atherosclerosis of the aortic arch. Patchy opacity in the left mid lung zone, new from prior. Right lung is clear. No pleural fluid or pneumothorax. There is degenerative change in the spine. IMPRESSION: 1. Patchy left midlung zone opacities suspicious for pneumonia. Followup PA and lateral chest X-ray is recommended in 3-4 weeks following trial of antibiotic therapy to ensure resolution and exclude underlying malignancy. 2. Stable mild cardiomegaly and aortic atherosclerosis. Electronically Signed   By: Jeb Levering M.D.   On: 02/22/2017 22:43    Medications: I have reviewed the patient's current medications.  Assesment:  Principal Problem:   CAP (community acquired pneumonia) Active Problems:   Diabetes type 2, controlled ([place])   KNEE PAIN   Essential hypertension   Complete heart block ([place])    Plan:  Medications reviewed Continue IV antibiotics CBC and BMP in AM Continue regular treatment.    LOS:  2 days   Fritz Cauthon 02/24/2017, 8:12 AM

## 2017-02-25 LAB — CBC
HCT: 34.1 % — ABNORMAL LOW (ref 36.0–46.0)
Hemoglobin: 11.1 g/dL — ABNORMAL LOW (ref 12.0–15.0)
MCH: 29.6 pg (ref 26.0–34.0)
MCHC: 32.6 g/dL (ref 30.0–36.0)
MCV: 90.9 fL (ref 78.0–100.0)
PLATELETS: 239 10*3/uL (ref 150–400)
RBC: 3.75 MIL/uL — ABNORMAL LOW (ref 3.87–5.11)
RDW: 13.9 % (ref 11.5–15.5)
WBC: 9.5 10*3/uL (ref 4.0–10.5)

## 2017-02-25 LAB — GLUCOSE, CAPILLARY
GLUCOSE-CAPILLARY: 131 mg/dL — AB (ref 65–99)
Glucose-Capillary: 114 mg/dL — ABNORMAL HIGH (ref 65–99)
Glucose-Capillary: 192 mg/dL — ABNORMAL HIGH (ref 65–99)
Glucose-Capillary: 139 mg/dL — ABNORMAL HIGH (ref 65–99)

## 2017-02-25 LAB — BASIC METABOLIC PANEL
Anion gap: 8 (ref 5–15)
BUN: 9 mg/dL (ref 6–20)
CHLORIDE: 105 mmol/L (ref 101–111)
CO2: 24 mmol/L (ref 22–32)
CREATININE: 0.68 mg/dL (ref 0.44–1.00)
Calcium: 8.9 mg/dL (ref 8.9–10.3)
GFR calc Af Amer: >60 mL/min (ref >60)
GFR calc non Af Amer: >60 mL/min (ref >60)
Glucose, Bld: 129 mg/dL — ABNORMAL HIGH (ref 65–99)
Potassium: 3.8 mmol/L (ref 3.5–5.1)
SODIUM: 137 mmol/L (ref 135–145)

## 2017-02-25 NOTE — Progress Notes (Signed)
Subjective: Patient feels better. She is more alert and awake. Her breathing is better.  Objective: Vital signs in last 24 hours: Temp:  [98.9 F (37.2 C)-101.1 F (38.4 C)] 99.1 F (37.3 C) (03/03 0500) Pulse Rate:  [81-93] 81 (03/03 0500) Resp:  [20] 20 (03/03 0500) BP: (128-161)/(55-82) 152/70 (03/03 0500) SpO2:  [96 %-100 %] 98 % (03/03 0500) Weight change:  Last BM Date: 02/23/17  Intake/Output from previous day: 03/02 0701 - 03/03 0700 In: 480 [P.O.:480] Out: 1550 [Urine:1550]  PHYSICAL EXAM General appearance: alert and no distress Resp: diminished breath sounds bilaterally and rhonchi bilaterally Cardio: S1, S2 normal GI: soft, non-tender; bowel sounds normal; no masses,  no organomegaly Extremities: extremities normal, atraumatic, no cyanosis or edema  Lab Results:  Results for orders placed or performed during the hospital encounter of 02/22/17 (from the past 48 hour(s))  Glucose, capillary     Status: Abnormal   Collection Time: 02/23/17 12:08 PM  Result Value Ref Range   Glucose-Capillary 158 (H) 65 - 99 mg/dL   Comment 1 Notify RN    Comment 2 Document in Chart   Glucose, capillary     Status: Abnormal   Collection Time: 02/23/17  5:36 PM  Result Value Ref Range   Glucose-Capillary 138 (H) 65 - 99 mg/dL   Comment 1 Notify RN    Comment 2 Document in Chart   Glucose, capillary     Status: Abnormal   Collection Time: 02/23/17  9:34 PM  Result Value Ref Range   Glucose-Capillary 168 (H) 65 - 99 mg/dL   Comment 1 Notify RN    Comment 2 Document in Chart   Glucose, capillary     Status: None   Collection Time: 02/24/17  7:41 AM  Result Value Ref Range   Glucose-Capillary 98 65 - 99 mg/dL  Glucose, capillary     Status: Abnormal   Collection Time: 02/24/17 11:07 AM  Result Value Ref Range   Glucose-Capillary 136 (H) 65 - 99 mg/dL  Glucose, capillary     Status: Abnormal   Collection Time: 02/24/17  4:50 PM  Result Value Ref Range   Glucose-Capillary  108 (H) 65 - 99 mg/dL   Comment 1 Notify RN    Comment 2 Document in Chart   Glucose, capillary     Status: Abnormal   Collection Time: 02/24/17  8:40 PM  Result Value Ref Range   Glucose-Capillary 191 (H) 65 - 99 mg/dL   Comment 1 Notify RN    Comment 2 Document in Chart   Basic metabolic panel     Status: Abnormal   Collection Time: 02/25/17  5:48 AM  Result Value Ref Range   Sodium 137 135 - 145 mmol/L   Potassium 3.8 3.5 - 5.1 mmol/L   Chloride 105 101 - 111 mmol/L   CO2 24 22 - 32 mmol/L   Glucose, Bld 129 (H) 65 - 99 mg/dL   BUN 9 6 - 20 mg/dL   Creatinine, Ser 0.68 0.44 - 1.00 mg/dL   Calcium 8.9 8.9 - 10.3 mg/dL   GFR calc non Af Amer >60 >60 mL/min   GFR calc Af Amer >60 >60 mL/min    Comment: (NOTE) The eGFR has been calculated using the CKD EPI equation. This calculation has not been validated in all clinical situations. eGFR's persistently <60 mL/min signify possible Chronic Kidney Disease.    Anion gap 8 5 - 15  CBC     Status: Abnormal  Collection Time: 02/25/17  5:48 AM  Result Value Ref Range   WBC 9.5 4.0 - 10.5 K/uL   RBC 3.75 (L) 3.87 - 5.11 MIL/uL   Hemoglobin 11.1 (L) 12.0 - 15.0 g/dL   HCT 34.1 (L) 36.0 - 46.0 %   MCV 90.9 78.0 - 100.0 fL   MCH 29.6 26.0 - 34.0 pg   MCHC 32.6 30.0 - 36.0 g/dL   RDW 13.9 11.5 - 15.5 %   Platelets 239 150 - 400 K/uL  Glucose, capillary     Status: Abnormal   Collection Time: 02/25/17  7:32 AM  Result Value Ref Range   Glucose-Capillary 114 (H) 65 - 99 mg/dL   Comment 1 Notify RN    Comment 2 Document in Chart     ABGS No results for input(s): PHART, PO2ART, TCO2, HCO3 in the last 72 hours.  Invalid input(s): PCO2 CULTURES Recent Results (from the past 240 hour(s))  Blood Culture (routine x 2)     Status: None (Preliminary result)   Collection Time: 02/22/17 11:01 PM  Result Value Ref Range Status   Specimen Description LEFT ANTECUBITAL  Final   Special Requests BOTTLES DRAWN AEROBIC AND ANAEROBIC [place]   Final   Culture NO GROWTH 3 DAYS  Final   Report Status PENDING  Incomplete  Blood Culture (routine x 2)     Status: None (Preliminary result)   Collection Time: 02/22/17 11:12 PM  Result Value Ref Range Status   Specimen Description BLOOD LEFT HAND  Final   Special Requests BOTTLES DRAWN AEROBIC AND ANAEROBIC 6CC  Final   Culture NO GROWTH 3 DAYS  Final   Report Status PENDING  Incomplete   Studies/Results: No results found.  Medications: I have reviewed the patient's current medications.  Assesment:  Principal Problem:   CAP (community acquired pneumonia) Active Problems:   Diabetes type 2, controlled ([place])   KNEE PAIN   Essential hypertension   Complete heart block ([place])    Plan:  Medications reviewed Continue IV antibiotics Continue regular treatment. Supportive care.    LOS: 3 days   Giordan Fordham 02/25/2017, 8:34 AM

## 2017-02-25 NOTE — Plan of Care (Signed)
Problem: Safety: Goal: Ability to remain free from injury will improve Outcome: Progressing Bed alarm on , up with assistance

## 2017-02-26 DIAGNOSIS — Z23 Encounter for immunization: Secondary | ICD-10-CM | POA: Diagnosis not present

## 2017-02-26 LAB — GLUCOSE, CAPILLARY: Glucose-Capillary: 101 mg/dL — ABNORMAL HIGH (ref 65–99)

## 2017-02-26 MED ORDER — LEVOFLOXACIN 500 MG PO TABS: 500.0000 mg | ORAL_TABLET | Freq: Every day | ORAL | 0 refills | Status: DC

## 2017-02-26 NOTE — Progress Notes (Signed)
Pt's IV catheter removed and intact. Pt's IV site clean dry and intact. Discharge instructions including medications and follow up appointments were reviewed and discussed with patient's daughter. Pt's daughter verbalized understanding of discharge instructions including medications and follow up appointments. All questions were answered and no further questions at this time. Pt in stable condition and in no acute distress at time of discharge. Pt escorted by RN.

## 2017-02-26 NOTE — Discharge Summary (Signed)
Physician Discharge Summary  Patient ID: Natalie Castro MRN: [id number] DOB/AGE: 01/10/1941 76 y.o. Primary [place], MD Admit date: 02/22/2017 Discharge date: 02/26/2017    Discharge Diagnoses:   Principal Problem:   CAP (community acquired pneumonia) Active Problems:   Diabetes type 2, controlled ([place])   KNEE PAIN   Essential hypertension   Complete heart block (HCC)   Allergies as of 02/26/2017      Reactions   Penicillins Hives, Itching   Has patient had a PCN reaction causing immediate rash, facial/tongue/throat swelling, SOB or lightheadedness with hypotension: No Has patient had a PCN reaction causing severe rash involving mucus membranes or skin necrosis: Yes Has patient had a PCN reaction that required hospitalization No Has patient had a PCN reaction occurring within the last 10 years: No If all of the above answers are "NO", then may proceed with Cephalosporin use.      Medication List    TAKE these medications   apixaban 5 MG Tabs tablet Commonly known as:  ELIQUIS Take 5 mg by mouth 2 (two) times daily.   atorvastatin 40 MG tablet Commonly known as:  LIPITOR Take 40 mg by mouth at bedtime.   hydrALAZINE 25 MG tablet Commonly known as:  APRESOLINE Take 25 mg by mouth 2 (two) times daily.   LANTUS SOLOSTAR 100 UNIT/ML Solostar Pen Generic drug:  Insulin Glargine Inject 30 Units into the skin at bedtime.   levofloxacin 500 MG tablet Commonly known as:  LEVAQUIN Take 1 tablet (500 mg total) by mouth daily.   losartan 100 MG tablet Commonly known as:  COZAAR Take 100 mg by mouth every morning.   metFORMIN 1000 MG tablet Commonly known as:  GLUCOPHAGE Take 1,000 mg by mouth 2 (two) times daily with a meal.   NIFEdipine 30 MG 24 hr tablet Commonly known as:  PROCARDIA-XL/ADALAT CC Take 30 mg by mouth daily.   ranitidine 300 MG tablet Commonly known as:  ZANTAC Take 300 mg by mouth every morning.       Discharged Condition:  improved    Consults: none  Significant Diagnostic Studies: Dg Chest 2 View  Result Date: 02/22/2017 CLINICAL DATA:  Cough.  Altered mental status. 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 Mild cardiomegaly and mediastinal contours are stable. There is atherosclerosis of the aortic arch. Patchy opacity in the left mid lung zone, new from prior. Right lung is clear. No pleural fluid or pneumothorax. There is degenerative change in the spine. IMPRESSION: 1. Patchy left midlung zone opacities suspicious for pneumonia. Followup PA and lateral chest X-ray is recommended in 3-4 weeks following trial of antibiotic therapy to ensure resolution and exclude underlying malignancy. 2. Stable mild cardiomegaly and aortic atherosclerosis. Electronically Signed   By: Jeb Levering M.D.   On: 02/22/2017 22:43    Lab Results: Basic Metabolic Panel:  Recent Labs  02/25/17 0548  NA 137  K 3.8  CL 105  CO2 24  GLUCOSE 129*  BUN 9  CREATININE 0.68  CALCIUM 8.9   Liver Function Tests: No results for input(s): AST, ALT, ALKPHOS, BILITOT, PROT, ALBUMIN in the last 72 hours.   CBC:  Recent Labs  02/25/17 0548  WBC 9.5  HGB 11.1*  HCT 34.1*  MCV 90.9  PLT 239    Recent Results (from the past 240 hour(s))  Blood Culture (routine x 2)     Status: None (Preliminary result)  Collection Time: 02/22/17 11:01 PM  Result Value Ref Range Status   Specimen Description LEFT ANTECUBITAL  Final   Special Requests BOTTLES DRAWN AEROBIC AND ANAEROBIC [place]  Final   Culture NO GROWTH 4 DAYS  Final   Report Status PENDING  Incomplete  Blood Culture (routine x 2)     Status: None (Preliminary result)   Collection Time: 02/22/17 11:12 PM  Result Value Ref Range Status   Specimen Description BLOOD LEFT HAND  Final   Special Requests BOTTLES DRAWN AEROBIC AND ANAEROBIC 6CC  Final   Culture NO GROWTH 4 DAYS  Final    Report Status PENDING  Incomplete     Hospital Course:   This is a 76 years old female who come to ER due to confusion and change in mental status. Patient evaluations including chest x-ray was suggestive of pneumonia. Patient was started on IV Levaquin. Patient improved and back to her baseline. She is being discharged home in stable condition on oral antibiotics to be followed in the office.  Discharge Exam: Blood pressure (!) 144/75, pulse 77, temperature 98.1 F (36.7 C), temperature source Oral, resp. rate 18, height 5\' 5"  (1.651 m), weight 93.4 kg (206 lb), SpO2 98 %.    Disposition:  home    Follow-up Information    Jiovanny Burdell, MD Follow up in 1 week(s).   Specialty:  Internal Medicine Contact information: [place] [address] [phone]           Signed: [name]   02/26/2017, 9:03 AM

## 2017-02-27 LAB — CULTURE, BLOOD (ROUTINE X 2)
Culture: NO GROWTH
Culture: NO GROWTH

## 2017-03-09 DIAGNOSIS — J189 Pneumonia, unspecified organism: Secondary | ICD-10-CM | POA: Diagnosis not present

## 2017-03-09 DIAGNOSIS — E1140 Type 2 diabetes mellitus with diabetic neuropathy, unspecified: Secondary | ICD-10-CM | POA: Diagnosis not present

## 2017-03-09 DIAGNOSIS — I442 Atrioventricular block, complete: Secondary | ICD-10-CM | POA: Diagnosis not present

## 2017-03-09 DIAGNOSIS — I10 Essential (primary) hypertension: Secondary | ICD-10-CM | POA: Diagnosis not present

## 2017-03-16 DIAGNOSIS — H524 Presbyopia: Secondary | ICD-10-CM | POA: Diagnosis not present

## 2017-03-16 DIAGNOSIS — H25813 Combined forms of age-related cataract, bilateral: Secondary | ICD-10-CM | POA: Diagnosis not present

## 2017-03-16 DIAGNOSIS — H52223 Regular astigmatism, bilateral: Secondary | ICD-10-CM | POA: Diagnosis not present

## 2017-03-16 DIAGNOSIS — H5203 Hypermetropia, bilateral: Secondary | ICD-10-CM | POA: Diagnosis not present

## 2017-04-28 ENCOUNTER — Other Ambulatory Visit: Payer: Self-pay | Admitting: *Deleted

## 2017-04-28 NOTE — Patient Outreach (Signed)
[facility]) Care Management  04/28/2017  Natalie Castro 1941-09-25 [id number]  Care Coordination  During [facility] CM visit with Natalie. SAMARRAH Castro, the patient's spouse, on 4/02/01/17 Natalie Castro inquired about need of Tree surgeon service.  Natalie Castro reports hospitalization x 2 in November 2017 and one hospitalization in February 2018.  She and Natalie Zollner have no longer been able to care for themselves in their home and have moved in with their daughter [name] not to far from their home.   Natalie Castro is a 77 year old female with [place] healthcare medicare coverage and PMH of DM type II, arthritis of knees, essential hypertension, pneumonia, complete heart block, saddle pulmonary embolism and elevated troponin level.  EPIC lists a [place] registry [place] for Natalie Castro.  Her pcp is Dr [name] (participating [facility] provider) Natalie Castro has had 2 admissions in the last 6 months (November 2017 and February 2018).  She uses a Corporate investment banker for mobility and is able to complete her own ADLs (bathing, feeding and dressing) with minimal set up from her   Plans:  [facility] CM made contact with [facility] CMA after reviewing EPIC chart information of Natalie Castro for referral as an active [facility] patient.   THN CM assisted Natalie Castro with finding a used rolling walker from [place] and an application for LEAF services during the last home visit to see Natalie Frein [facility] CM contacted [facility] connected with [place] healthcare to order Natalie Castro a first Medical catalog to be sent to her home to assist with DME as needed [facility] CM will see Natalie Castro in 1-2 weeks for follow hone visit  [place]. [name], RN, BSN, [place] Care Management [phone]

## 2017-05-05 ENCOUNTER — Other Ambulatory Visit: Payer: Self-pay | Admitting: *Deleted

## 2017-05-05 NOTE — Patient Outreach (Signed)
[facility]) Care Management   05/06/2017  JAMYLAH MARINACCIO [birth year] [id number]  BIANCA RANERI is an 76 y.o. female lives at her daughter ([name]/"juanita") home with her husband, hx of GERD, DM, heart block s/p ppm, confused PNA, arthritis of knees, essential hypertension, complete heart block (with a STJ dual chamber pacemaker place by surgeon, [name] on 11/24/15), saddle pulmonary embolism and elevated troponin level   Mrs Lundeen inquired about [place] referral services during the 04/21/17 [facility] CM home visit to see her husband, [name].  THN CM verified Mrs Schlegel had been hospitalized x 2 in the last 6 months with DM and HTN (chronic medical conditions followed by [facility]).  Mrs Rozar reports hospitalization x 2 in November 2017 and one hospitalization in February 2018.  THN CM initiated referral process   Subjective:  "I feel good today"  "I have not gotten the catalog yet" "the cost of my medicines have gone from thirty -six dollars to seventy two dollars"  Objective:   BP (!) 138/91 (BP Location: Left Arm, Patient Position: Sitting, Cuff Size: Large)   Pulse 77   Resp 20   SpO2 96%   Review of Systems  Constitutional: Negative for chills, diaphoresis, fever, malaise/fatigue and weight loss.  HENT: Negative for congestion, ear discharge, ear pain, hearing loss, nosebleeds, sinus pain, sore throat and tinnitus.   Eyes: Negative.  Negative for blurred vision, double vision, photophobia, pain, discharge and redness.  Respiratory: Negative.  Negative for cough, hemoptysis, sputum production, shortness of breath, wheezing and stridor.   Cardiovascular: Positive for leg swelling. Negative for chest pain, palpitations, orthopnea, claudication and PND.       Has a pacemaker   Gastrointestinal: Positive for constipation. Negative for abdominal pain, blood in stool, diarrhea, heartburn, melena, nausea and vomiting.  Genitourinary: Negative.  Negative for dysuria, flank pain,  frequency, hematuria and urgency.  Musculoskeletal: Negative for falls and myalgias.  Skin: Negative for itching and rash.  Neurological: Positive for weakness. Negative for dizziness, tingling, tremors, sensory change, speech change, focal weakness, seizures, loss of consciousness and headaches.  Endo/Heme/Allergies: Negative for environmental allergies and polydipsia. Does not bruise/bleed easily.  Psychiatric/Behavioral: Negative for depression, hallucinations, memory loss, substance abuse and suicidal ideas. The patient is not nervous/anxious and does not have insomnia.     Physical Exam  Constitutional: She is oriented to person, place, and time. She appears well-developed and well-nourished.  HENT:  Head: Normocephalic and atraumatic.  Right Ear: External ear normal.  Left Ear: External ear normal.  Eyes: Conjunctivae are normal. Pupils are equal, round, and reactive to light.  Neck: Normal range of motion. Neck supple.  Cardiovascular: Normal rate, regular rhythm, normal heart sounds and intact distal pulses.   Respiratory: Breath sounds normal.  GI: Soft. Bowel sounds are normal.  Musculoskeletal: She exhibits edema.  Neurological: She is alert and oriented to person, place, and time.  Skin: Skin is warm and dry.  Psychiatric: She has a normal mood and affect. Her behavior is normal. Judgment and thought content normal.    Encounter Medications:   Outpatient Encounter Prescriptions as of 05/05/2017  Medication Sig Note  . apixaban (ELIQUIS) 5 MG TABS tablet Take 5 mg by mouth 2 (two) times daily.   Marland Kitchen atorvastatin (LIPITOR) 40 MG tablet Take 40 mg by mouth at bedtime.    . hydrALAZINE (APRESOLINE) 25 MG tablet Take 25 mg by mouth 2 (two) times daily.   Marland Kitchen LANTUS SOLOSTAR 100 UNIT/ML  Solostar Pen Inject 30 Units into the skin at bedtime.    Marland Kitchen losartan (COZAAR) 100 MG tablet Take 100 mg by mouth every morning.    . metFORMIN (GLUCOPHAGE) 1000 MG tablet Take 1,000 mg by mouth 2  (two) times daily with a meal.   . NIFEdipine (PROCARDIA-XL/ADALAT CC) 30 MG 24 hr tablet Take 30 mg by mouth daily.   . ranitidine (ZANTAC) 300 MG tablet Take 300 mg by mouth every morning.    Marland Kitchen levofloxacin (LEVAQUIN) 500 MG tablet Take 1 tablet (500 mg total) by mouth daily. (Patient not taking: Reported on 05/05/2017) 05/05/2017: Finished    No facility-administered encounter medications on file as of 05/05/2017.     Functional Status:   In your present state of health, do you have any difficulty performing the following activities: 02/26/2017 02/23/2017  Hearing? - N  Vision? - N  Difficulty concentrating or making decisions? - N  Walking or climbing stairs? - Y  Dressing or bathing? - Y  Doing errands, shopping? N N  Some recent data might be hidden    Fall/Depression Screening:    No flowsheet data found. No flowsheet data found.  Assessment:    Mrs Arrazola presents today sitting in the living room of her daughter, [name]'s trailer Dressed and alert x 3 with her Rolator present bur also has a rolling walker she prefers to take with her when she goes out that she reports is not as bulky as her Rolator for transporting in her family member's cars..  She is pleasant and cooperative and is able to sign her own [facility] consent form after it was reviewed with her.   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  Acute medical conditions No acute medical issues identified today  Chronic medical conditions  Last BM this morning, formed.  Her DM and HTN are monitored by [name] and controlled with insulin, Lantus and BP medications.  She has voiced concerns about the cost of her medications and agrees to a referral to [place] to check on medication assistance programs.  Her pacemaker is monitored by Dr "Marcello Moores" She is scheduled to have her pacemaker checked on 05/18/17 at 0920 remotely.   She was noted to eat a serving of fried potatoes with onions and eat a pastry for dessert for lunch with  cranberry juice cooked and provided by her daughter and is reporting an increase in cbgs "since I my last time being in the hospital."  When [facility] CM inquired about recent sick days she denied having any in the last 2 weeks.  THN CM educated her in increase cbgs with an intake of increased carbohydrates.and encouraged a balanced diet to include proteins, carbohydrates, fruits and vegetables.  Mrs Noto discussed wanting to go to the [employer] farm.    Plan: [facility] referral for community resources, Transportation to [employer] center Mrs Herd spoke with [facility] SW during this home visit about LEAF center and placing her and husband on the mobile wheels waiting list, checking for local pantries to assist with food while staying with daughter  [place] referral for check for possible medication assistance programs for Lantus,and possible other medications She reports a change in the cost of her medications (increase in cost) at her pharmacy. Check for interactions THN CM will follow up with Mrs Hildebrant and see her in 2 weeks for home visit THN to route note to pcp Legrand Rams) and CV Lovena Le)  [place]  Problem One     Most Recent Value  Care Plan Problem One  Lack of community resources   Role Documenting the Problem One  Care Management Coordinator  Care Plan for Problem One  Active  THN Long Term Goal (31-90 days)  Over the next 31 days the patient will participate in community programs available to her   [place] Goal Start Date  05/05/17  Interventions for Problem One Long Term Goal  Referral to [facility] SW after assessing needs, Refer to [employer], educate patient on resources, provide aplplicatons for [employer], assist with obtaining a united health care DME catalog  THN CM Short Term Goal #1 (0-30 days)  over the next 30 days patient agree to available resources in the community and via united health care   [facility] CM Short Term Goal #1 Start Date  05/05/17  Interventions  for Short Term Goal #1  Referral to [facility] SW after assessing needs, Refer to [employer] health care resources, educate patient on resources, provide aplplicatons for [employer], assist with obtaining a united health care DME catalog    [facility] CM Care Plan Problem Two     Most Recent Value  Care Plan Problem Two  Knowledge deficit care for chronic medical conditions (DM, HTN)  Role Documenting the Problem Two  Care Management Coordinator  Care Plan for Problem Two  Active  Interventions for Problem Two Long Term Goal   Assess knowledge of home care needs, educated on chronic medical conditions, Assist with DMe needed to monitor medical conditions.monitor medical conditions re evaluate home care knowledge of chronic medical conditions (DM, HTN)  THN Long Term Goal (31-90) days  over the next 45 days the patient will be able to verbalize and demonstrate home care for chronic medical conditions (DM, HTN)  THN Long Term Goal Start Date  05/05/17  THN CM Short Term Goal #1 (0-30 days)  over the next 30 days patient will demonstrate and verbalize improvements in diet to manage DM, HTN and document cbgs and BP values that monitor chronic medical conditions, (DM, HTN)  THN CM Short Term Goal #1 Start Date  05/05/17  Interventions for Short Term Goal #2   assist with getting DME for monitoring HTN, assess knowledge of HTN, DM care, educate on low sodium, heart healthy & DM diets      Annie Saephan L. [name], RN, BSN, [place] Care Management [phone]

## 2017-05-05 NOTE — Patient Outreach (Signed)
[facility]) Care Management  05/05/2017  Natalie Castro Jul 14, 1941 [id number]   Care Coordination  Call to [name] to remind of the home visit for today During last visit Pt was not ready when Cm arrived.   CM inquired if [facility] Medicare book had arrived and informed it had not arrived  Plan to see Patient today   [name], RN, BSN, [place] Care Management [phone]

## 2017-05-08 ENCOUNTER — Other Ambulatory Visit: Payer: Self-pay | Admitting: Licensed Clinical Social Worker

## 2017-05-08 NOTE — Patient Outreach (Signed)
Assessment:  CSW [employer] transferred Natalie Castro, DOB 2041-10-13, to [place] on 05/08/17.  Norva Riffle.Anjelo Pullman MSW, LCSW Licensed Clinical Social Worker [facility] Care Management [phone]

## 2017-05-10 ENCOUNTER — Encounter: Payer: Self-pay | Admitting: Student-PharmD

## 2017-05-12 ENCOUNTER — Other Ambulatory Visit: Payer: Self-pay | Admitting: Pharmacist

## 2017-05-12 NOTE — Patient Outreach (Signed)
[facility]) Care Management  [place]   05/12/2017  Natalie Castro 09/03/1941 [id number]  Subjective: Patient is a 76 year old female who was referred to [facility] for medication assistance due to the cost of her medications increasing per patient report.   Medication reconciliation was performed based on medication fill history obtained from [facility]. Patient uses [facility] to blister pack her and her husband's medications. She reports missing no doses since using bubble packs, as well as no bruising or bleeding. Patient was able to confirm her dose of Lantus.  Patient was also concerned about the cost of her husband's prescription medications, however in reviewing them, we saw nothing of great cost that we could help with. 2018 out-of-pocket spend was obtained for both the patient and her husband for review from [place].  Objective:  Encounter Medications: Outpatient Encounter Prescriptions as of 05/12/2017  Medication Sig Note  . apixaban (ELIQUIS) 5 MG TABS tablet Take 5 mg by mouth 2 (two) times daily.   Marland Kitchen atorvastatin (LIPITOR) 40 MG tablet Take 40 mg by mouth at bedtime.    . hydrALAZINE (APRESOLINE) 25 MG tablet Take 25 mg by mouth 2 (two) times daily.   Marland Kitchen LANTUS SOLOSTAR 100 UNIT/ML Solostar Pen Inject 30 Units into the skin at bedtime.    Marland Kitchen losartan (COZAAR) 100 MG tablet Take 100 mg by mouth every morning.    . metFORMIN (GLUCOPHAGE) 1000 MG tablet Take 1,000 mg by mouth 2 (two) times daily with a meal.   . NIFEdipine (PROCARDIA-XL/ADALAT CC) 30 MG 24 hr tablet Take 30 mg by mouth daily.   . ranitidine (ZANTAC) 300 MG tablet Take 300 mg by mouth every morning.    . [DISCONTINUED] levofloxacin (LEVAQUIN) 500 MG tablet Take 1 tablet (500 mg total) by mouth daily. (Patient not taking: Reported on 05/05/2017) 05/05/2017: Finished    No facility-administered encounter medications on file as of 05/12/2017.     Functional Status: In your present state of health, do you  have any difficulty performing the following activities: 02/26/2017 02/23/2017  Hearing? - N  Vision? - N  Difficulty concentrating or making decisions? - N  Walking or climbing stairs? - Y  Dressing or bathing? - Y  Doing errands, shopping? N N  Some recent data might be hidden    Fall/Depression Screening: No flowsheet data found. No flowsheet data found.    Assessment: Drugs sorted by system:   Cardiovascular: Eliquis, Procardia, hydralazine, losartan, Lipitor Gastrointestinal: ranitidine Endocrine: Lantus, metformin  Duplications in therapy: None Gaps in therapy: None Medications to avoid in the elderly: None Drug interactions: None  Other issues noted: None -Patient is not eligible for low income subsidy based on her household size and income. She may be eligible for manufacturer patient assistance through [employer] or [employer] in the future, but she has only spent $562 out-of-pocket so far on her prescription medications and she will need to spend around [phone] before she is eligible.  Patient believes she can afford her medications for now.   Plan: [facility] pharmacist performed medication review -Will follow up with patient via phone in 3 months when she is closer to reaching her out-of-pocket spend required for medication assistance.  -We will send a letter with our information to the patient and have told her to call us if she has issues affording her medications between now and follow up in 3 months  Bennye Alm, PharmD, [place] PGY2 Pharmacy Resident [phone]

## 2017-05-16 ENCOUNTER — Other Ambulatory Visit: Payer: Self-pay | Admitting: *Deleted

## 2017-05-16 NOTE — Patient Outreach (Signed)
[facility]) Care Management  05/16/2017  Natalie Castro 1941-01-30 [id number]   CSW received referral from [place], Joellyn Quails for community resources/transportation to Natalie Castro. CSW called to schedule initial home visit for Fri, June 8th @ 11am when CSW is scheduled for follow-up visit with patient's husband, [name]. Initial assessment & note to follow.    Raynaldo Opitz, LCSW [facility]  Clinical Social Worker cell #: [phone]

## 2017-05-18 ENCOUNTER — Ambulatory Visit (INDEPENDENT_AMBULATORY_CARE_PROVIDER_SITE_OTHER): Payer: Medicare Other | Admitting: *Deleted

## 2017-05-18 DIAGNOSIS — I442 Atrioventricular block, complete: Secondary | ICD-10-CM | POA: Diagnosis not present

## 2017-05-18 NOTE — Progress Notes (Signed)
Remote pacemaker transmission.   

## 2017-05-19 ENCOUNTER — Other Ambulatory Visit: Payer: Self-pay | Admitting: *Deleted

## 2017-05-19 ENCOUNTER — Encounter: Payer: Self-pay | Admitting: Cardiology

## 2017-05-19 NOTE — Patient Outreach (Signed)
[facility]) Care Management   05/20/2017  Natalie Castro 1941/03/02 [id number]  Natalie Castro is an 76 y.o. 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  THN CM viewed [facility] referral criteria and verified [name] had been hospitalized x 2 in the last 6 months with DM and HTN (chronic medical conditions followed by [facility]).  [name] reports hospitalization x 2 in November 2017 and one hospitalization in February 2018.  THN CM initiated referral process   Subjective:  "I been doing quite good lately" "I urinate more at night" "No issues with medicines"   Objective:   Review of Systems  Constitutional: Negative.  Negative for chills, diaphoresis, fever, malaise/fatigue and weight loss.  HENT: Negative.  Negative for congestion, ear discharge, ear pain, hearing loss, nosebleeds, sinus pain, sore throat and tinnitus.   Eyes: Negative.  Negative for blurred vision, double vision, photophobia, pain, discharge and redness.  Respiratory: Negative.  Negative for cough, hemoptysis, sputum production, shortness of breath, wheezing and stridor.   Cardiovascular: Negative.  Negative for chest pain, palpitations, orthopnea, claudication, leg swelling and PND.  Gastrointestinal: Negative.  Negative for abdominal pain, blood in stool, constipation, diarrhea, melena, nausea and vomiting.  Genitourinary: Negative.  Negative for dysuria, flank pain, frequency, hematuria and urgency.  Musculoskeletal: Positive for back pain and joint pain. Negative for falls, myalgias and neck  pain.       Back and knee pain with activity use of rollator to sit and rest   Skin: Negative.  Negative for itching and rash.  Neurological: Negative.  Negative for dizziness, tingling, tremors, sensory change, speech change, focal weakness, seizures, loss of consciousness, weakness and headaches.  Endo/Heme/Allergies: Negative.  Negative for environmental allergies and polydipsia. Does not bruise/bleed easily.  Psychiatric/Behavioral: Negative.  Negative for depression, hallucinations, memory loss, substance abuse and suicidal ideas. The patient is not nervous/anxious and does not have insomnia.     Physical Exam  Constitutional: She is oriented to person, place, and time. She appears well-developed and well-nourished.  HENT:  Head: Normocephalic and atraumatic.  Right Ear: External ear normal.  Left Ear: External ear normal.  Eyes: Conjunctivae are normal. Pupils are equal, round, and reactive to light.  Neck: Normal range of motion. Neck supple.  Cardiovascular: Normal rate, regular rhythm, normal heart sounds and intact distal pulses.   Respiratory: Effort normal and breath sounds normal.  GI: Soft. Bowel sounds are normal.  Musculoskeletal: Normal range of motion.  Neurological: She is alert and oriented to person, place, and time.  Skin: Skin is warm and dry.  Psychiatric: She has a normal mood and affect. Her behavior is normal. Judgment and thought content normal.    Encounter Medications:   Outpatient Encounter Prescriptions as of 05/19/2017  Medication Sig  . apixaban (ELIQUIS) 5 MG TABS tablet Take 5 mg by mouth 2 (two) times daily.  Marland Kitchen atorvastatin (LIPITOR) 40 MG tablet Take 40 mg by mouth at bedtime.   . hydrALAZINE (APRESOLINE) 25 MG tablet Take 25 mg by mouth 2 (two) times daily.  Marland Kitchen LANTUS SOLOSTAR 100 UNIT/ML  Solostar Pen Inject 30 Units into the skin at bedtime.   Marland Kitchen losartan (COZAAR) 100 MG tablet Take 100 mg by mouth every morning.   . metFORMIN (GLUCOPHAGE) 1000 MG  tablet Take 1,000 mg by mouth 2 (two) times daily with a meal.  . NIFEdipine (PROCARDIA-XL/ADALAT CC) 30 MG 24 hr tablet Take 30 mg by mouth daily.  . ranitidine (ZANTAC) 300 MG tablet Take 300 mg by mouth every morning.    No facility-administered encounter medications on file as of 05/19/2017.     Functional Status:   In your present state of health, do you have any difficulty performing the following activities: 05/19/2017 02/26/2017  Hearing? N -  Vision? N -  Difficulty concentrating or making decisions? N -  Walking or climbing stairs? Y -  Dressing or bathing? N -  Doing errands, shopping? Y N  Preparing Food and eating ? Y -  Using the Toilet? N -  In the past six months, have you accidently leaked urine? N -  Do you have problems with loss of bowel control? N -  Managing your Medications? N -  Managing your Finances? Y -  Housekeeping or managing your Housekeeping? Y -  Some recent data might be hidden    Fall/Depression Screening:    Fall Risk  05/19/2017  Falls in the past year? No  Risk for fall due to : Impaired balance/gait;Impaired mobility;Medication side effect;Impaired vision   PHQ 2/9 Scores 05/19/2017  PHQ - 2 Score 0    Assessment:    .[name] presents today sitting in the living room on the couch with her husband wrapped in her individual blanket and sitting on an incontinent pad with her Rolator near her.  THN CM greeted her and commented on her new hair style.    Acute medical concerns denied by [name] other than noting more urination at night during the day She is not listed to have a diuretic on her medication list  Encouraged her to discuss with pcp during June 2018 appointment.  Chronic medical concerns (DM, HTN, Hyperlipidemia, CAD, arthritis of bilateral knees) is being managed with home monitoring, diet and medications. Pacemaker monitoring occurred on 05/18/17 Diabetic diet information was reviewed today  She reports pain mainly during ambulation.     Medications/DME. [place] has contacted her and resolved medicatin issues [place] pharmacy call next quarter scheduled No BP cuff She and Mr Yerby have received catalogs from [facility] through united health care plans but she has not purchased an item yet  [facility] CM encouraged her to obtain a BP cuff to start monitoring her BP prior to BP medication administration and HTN education will be reviewed during next home visit  Referrals active with [facility] SW and pharmacy  Future medical appointments: See Legrand Rams in June 2018 and last saw him in March 2018   [facility] to route note to pcp ( ) and CV ( )  Kimberly L. [name], RN, BSN, CCM [facility] Care Management [phone]    Plan:  [facility] CM Care Plan Problem One     Most Recent Value  Care Plan Problem One  Lack of community resources   Role Documenting the Problem One  Care Management [place] for Problem One  Active  THN Long Term Goal   Over the next 31 days the patient will participate in community programs available to her   [place] Goal Start Date  05/05/17  Interventions for  Problem One Long Term Goal  Referral to [facility] SW after assessing needs, Refer to [place] health care resources, educate patient on resources, provide aplplicatons for [employer], assist with obtaining a united health care DME catalog  [facility] CM Short Term Goal #1   over the next 30 days patient agree to available resources in the community and via united health care   [facility] CM Short Term Goal #1 Start Date  05/05/17  Interventions for Short Term Goal #1  Referral to [facility] SW after assessing needs, Refer to [employer] health care resources, educate patient on resources, provide aplplicatons for [employer], assist with obtaining a united health care DME catalog    [name] CM Care Plan Problem Two     Most Recent Value  Care Plan Problem Two  Knowledge deficit care for chronic medical conditions (DM, HTN)  Role Documenting the Problem Two  Care  Management Coordinator  Care Plan for Problem Two  Active  Interventions for Problem Two Long Term Goal   Assess knowledge of home care needs, educated on chronic medical conditions, Assist with DMe needed to monitor medical conditions.monitor medical conditions re evaluate home care knowledge of chronic medical conditions (DM, HTN)  THN Long Term Goal  over the next 45 days the patient will be able to verbalize and demonstrate home care for chronic medical conditions (DM, HTN)  THN Long Term Goal Start Date  05/05/17  THN CM Short Term Goal #1   over the next 30 days patient will demonstrate and verbalize improvements in diet to manage DM, HTN and document cbgs and BP values that monitor chronic medical conditions, (DM, HTN)  THN CM Short Term Goal #1 Start Date  05/05/17  Interventions for Short Term Goal #2   assist with getting DME for monitoring HTN, assess knowledge of HTN, DM care, educate on low sodium, heart healthy & DM diets     Kimberly L. [name], RN, BSN, [place] Care Management [phone]

## 2017-05-23 LAB — CUP PACEART REMOTE DEVICE CHECK
Battery Voltage: 3.01 V
Brady Statistic AP VP Percent: 1 %
Brady Statistic AS VP Percent: 15 %
Brady Statistic AS VS Percent: 84 %
Brady Statistic RV Percent Paced: 15 %
Implantable Lead Implant Date: 20161129
Implantable Lead Location: 753860
Implantable Pulse Generator Implant Date: 20161129
Lead Channel Impedance Value: 530 Ohm
Lead Channel Pacing Threshold Amplitude: 0.875 V
Lead Channel Pacing Threshold Pulse Width: 0.4 ms
Lead Channel Sensing Intrinsic Amplitude: 8.8 mV
Lead Channel Setting Pacing Amplitude: 1.875
Lead Channel Setting Pacing Amplitude: 2 V
Lead Channel Setting Pacing Pulse Width: 0.4 ms
Lead Channel Setting Sensing Sensitivity: 4 mV
MDC IDC LEAD IMPLANT DT: 20161129
MDC IDC LEAD LOCATION: 753859
MDC IDC MSMT BATTERY REMAINING LONGEVITY: 126 mo
MDC IDC MSMT BATTERY REMAINING PERCENTAGE: 95.5 %
MDC IDC MSMT LEADCHNL RA IMPEDANCE VALUE: 360 Ohm
MDC IDC MSMT LEADCHNL RA PACING THRESHOLD PULSEWIDTH: 0.4 ms
MDC IDC MSMT LEADCHNL RA SENSING INTR AMPL: 4.6 mV
MDC IDC MSMT LEADCHNL RV PACING THRESHOLD AMPLITUDE: 0.75 V
MDC IDC PG SERIAL: 7826037
MDC IDC SESS DTM: 20180524080016
MDC IDC STAT BRADY AP VS PERCENT: 1 %
MDC IDC STAT BRADY RA PERCENT PACED: 1 %

## 2017-05-26 ENCOUNTER — Telehealth: Payer: Self-pay | Admitting: *Deleted

## 2017-05-26 NOTE — Patient Outreach (Signed)
[place] [facility]) Care Management  05/26/2017  VAN EHLERT Jul 04, 1941 [id number]  Care coordination  [facility] CM spoke with [facility] staff on 05/24/17 to inquire about Mrs 7 Mr Purdy total DME credits quarterly Staff confirmed they have only 40 credits a quarter that does not roll over to the next quarter  Mrs Wilborn was updated    Plans Mr & Mrs Piontek will be seen next week for home visit  [place]. [name], RN, BSN, [place] Care Management [phone]

## 2017-06-02 ENCOUNTER — Other Ambulatory Visit: Payer: Self-pay | Admitting: *Deleted

## 2017-06-02 ENCOUNTER — Encounter: Payer: Self-pay | Admitting: *Deleted

## 2017-06-02 NOTE — Patient Outreach (Signed)
[facility] (THN) Care Management  THN Care Manager  06/02/2017   Natalie Castro 09/17/1941 [id number]   Encounter Medications:  Outpatient Encounter Prescriptions as of 06/02/2017  Medication Sig  . apixaban (ELIQUIS) 5 MG TABS tablet Take 5 mg by mouth 2 (two) times daily.  . atorvastatin (LIPITOR) 40 MG tablet Take 40 mg by mouth at bedtime.   . hydrALAZINE (APRESOLINE) 25 MG tablet Take 25 mg by mouth 2 (two) times daily.  . LANTUS SOLOSTAR 100 UNIT/ML Solostar Pen Inject 30 Units into the skin at bedtime.   . losartan (COZAAR) 100 MG tablet Take 100 mg by mouth every morning.   . metFORMIN (GLUCOPHAGE) 1000 MG tablet Take 1,000 mg by mouth 2 (two) times daily with a meal.  . NIFEdipine (PROCARDIA-XL/ADALAT CC) 30 MG 24 hr tablet Take 30 mg by mouth daily.  . ranitidine (ZANTAC) 300 MG tablet Take 300 mg by mouth every morning.    No facility-administered encounter medications on file as of 06/02/2017.     Functional Status:  In your present state of health, do you have any difficulty performing the following activities: 06/02/2017 05/19/2017  Hearing? N N  Vision? Y N  Difficulty concentrating or making decisions? N N  Walking or climbing stairs? Y Y  Dressing or bathing? N N  Doing errands, shopping? Y Y  Preparing Food and eating ? - Y  Using the Toilet? - N  In the past six months, have you accidently leaked urine? - N  Do you have problems with loss of bowel control? - N  Managing your Medications? - N  Managing your Finances? - Y  Housekeeping or managing your Housekeeping? - Y  Some recent data might be hidden    Fall/Depression Screening: Fall Risk  06/02/2017 05/19/2017  Falls in the past year? No No  Risk for fall due to : Impaired mobility Impaired balance/gait;Impaired mobility;Medication side effect;Impaired vision   PHQ 2/9 Scores 06/02/2017 05/19/2017  PHQ - 2 Score 0 0    Assessment:  CSW met with patient & daughter, Natalie "[name]" in daughter's  home where patient & her husband have been living. CSW met with patient to discuss community resources - informed her that CSW called Aging, Disability & Transit Services to get her & her husand both on the waitlist for Meals on Wheels and that currently there is a 14 month waitlist. CSW explained that if they needed meals emergently, that they could always call [employer] back to have them deliver meals but pay for them out of pocket. Patient declined stating that currently they live with their daughter & no need as of yet. CSW also provided patient with information on the state-funded Project CARE (Caregiver Alternatives to Running on Empty). Patient asked about resources for dealing with Alzheimers Dementia specific to anger issues as her husband will at times become obstinate and stubborn (i.e: refusing to leave his bedroom). Patient has an appointment with her PCP - Dr. Tesfaye Fanta on July 16th at 10:30am, but daughter will transport her.    Plan:  CSW will have information mailed to patient's wife on resources as well as Alzheimers Dementia resource guide & follow-up with patient in 2 weeks.   Kelly Harrison, LCSW [facility]  Clinical Social Worker cell #: [phone]  

## 2017-06-05 NOTE — Patient Outreach (Signed)
[facility]) Care Management   06/02/2017  Natalie Castro 06/09/1941 [id number]  Natalie Castro is an 76 y.o. 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 Natalie Castro viewed [facility] referral criteria and verified Natalie Castro had been hospitalized x 2 in the last 6 months with DM and HTN (chronic medical conditions followed by [facility]). Natalie Castro reports hospitalization x 2 in November 2017 and one hospitalization in February 2018. Natalie Castro initiated referral process and Natalie Castro services started on 05/05/17  This is the 3rd [facility] Castro home visit with Natalie Castro.   Subjective: "I have to check it every day? Related to being advised to check her BP prior to taking BP medications.  "I eat what is they cook" referring to her family members- now lives with her daughter  Objective:   BP 140/78   Pulse 81   Temp 97.6 F (36.4 C) (Oral)   Resp 20   Ht 1.651 m (5\' 5" )   Wt 206 lb (93.4 kg)   SpO2 98%   BMI 34.28 kg/m  Review of Systems  Constitutional: Negative for chills, diaphoresis, fever, malaise/fatigue and weight loss.  HENT: Negative for congestion, ear discharge, ear pain, hearing loss, nosebleeds, sinus pain, sore throat and tinnitus.   Eyes: Negative for blurred vision, double vision, photophobia, pain, discharge and redness.  Respiratory: Negative for cough, hemoptysis, sputum production, shortness of breath, wheezing and stridor.   Cardiovascular: Positive for leg swelling. Negative for chest pain, palpitations, orthopnea, claudication and PND.  Gastrointestinal:  Negative for abdominal pain, blood in stool, constipation, diarrhea, heartburn, melena, nausea and vomiting.  Genitourinary: Negative for dysuria, flank pain, frequency, hematuria and urgency.  Musculoskeletal: Positive for joint pain. Negative for back pain, falls, myalgias and neck pain.  Skin: Negative for itching and rash.  Neurological: Positive for weakness. Negative for dizziness, tingling, tremors, sensory change, speech change, focal weakness, seizures, loss of consciousness and headaches.  Endo/Heme/Allergies: Negative for environmental allergies and polydipsia. Does not bruise/bleed easily.  Psychiatric/Behavioral: Negative for depression, hallucinations, memory loss, substance abuse and suicidal ideas. The patient is not nervous/anxious and does not have insomnia.     Physical Exam  Constitutional: She is oriented to person, place, and time. She appears well-developed and well-nourished.  HENT:  Head: Normocephalic and atraumatic.  Eyes: Conjunctivae are normal. Pupils are equal, round, and reactive to light.  Neck: Normal range of motion. Neck supple.  Cardiovascular: Normal rate, normal heart sounds and intact distal pulses.   Respiratory: Effort normal and breath sounds normal.  GI: Soft. Bowel sounds are normal.  Musculoskeletal: Normal range of motion.  Neurological: She is alert and oriented to person, place, and time.  Skin: Skin is warm.  Psychiatric: She has a normal mood and affect. Her behavior is normal. Judgment and thought content normal.    Encounter Medications:   Outpatient Encounter Prescriptions as of 06/02/2017  Medication Sig  . apixaban (ELIQUIS) 5 MG TABS tablet Take 5 mg by mouth 2 (two) times daily.  Marland Kitchen atorvastatin (LIPITOR) 40 MG tablet Take 40 mg by mouth at bedtime.   Marland Kitchen  hydrALAZINE (APRESOLINE) 25 MG tablet Take 25 mg by mouth 2 (two) times daily.  Marland Kitchen LANTUS SOLOSTAR 100 UNIT/ML Solostar Pen Inject 30 Units into the skin at bedtime.   Marland Kitchen losartan  (COZAAR) 100 MG tablet Take 100 mg by mouth every morning.   . metFORMIN (GLUCOPHAGE) 1000 MG tablet Take 1,000 mg by mouth 2 (two) times daily with a meal.  . NIFEdipine (PROCARDIA-XL/ADALAT CC) 30 MG 24 hr tablet Take 30 mg by mouth daily.  . ranitidine (ZANTAC) 300 MG tablet Take 300 mg by mouth every morning.    No facility-administered encounter medications on file as of 06/02/2017.     Functional Status:   In your present state of health, do you have any difficulty performing the following activities: 06/02/2017 05/19/2017  Hearing? N N  Vision? Y N  Difficulty concentrating or making decisions? N N  Walking or climbing stairs? Y Y  Dressing or bathing? N N  Doing errands, shopping? [name]  Preparing Food and eating ? - Y  Using the Toilet? - N  In the past six months, have you accidently leaked urine? - N  Do you have problems with loss of bowel control? - N  Managing your Medications? - N  Managing your Finances? - Y  Housekeeping or managing your Housekeeping? - Y  Some recent data might be hidden    Fall/Depression Screening:    Fall Risk  06/02/2017 05/19/2017  Falls in the past year? No No  Risk for fall due to : Impaired mobility Impaired balance/gait;Impaired mobility;Medication side effect;Impaired vision   PHQ 2/9 Scores 06/02/2017 05/19/2017  PHQ - 2 Score 0 0    Assessment:   Presents today sitting on the couch next to Natalie Castro at her daughter's home.  She offered [facility] Castro her sit and sat on her rolator.  Acute Medical Conditions: Elevation in BP 140/78 She denies pain to her knee today because " I have not done much walking"but reports it can get to a pain score of 8 on a scale of 1-10 with 10 being the most extreme pain she can tolerate  Chronic Medical Conditions (DM, HTN, knee pain, cardiac conditions) Natalie Castro continues to manage her medical conditions with medications and last tele monitoring for her pacemaker was on 05/18/17  Today Natalie Castro completed HTN assessment and  education. Encouraged BP checks, DM checks and low sodium, heart healthy DM diet.  Today daughter is preparing mackerel patties during the assessment and this is what Natalie Almendariz states she will eat.  Medications/DME Her medications are sent via [employer] blister pack delivery and she is being followed by [place] for medication needs Natalie Selman reports having a BP cuff at her home that she will have family to find to start taking her bp regularly    Referrals [facility] SW arrived and offered POA/living will forms  Future medical appointments: [facility] confirmed by contacting her pcp office that she had an appointment for 06/16/17 that no one is aware of the reason for.  While on speaker phone Natalie Caldwell, her daughter and the pcp receptionist agreed to cancel the 06/16/17 appt and for her to attend the 07/10/17 1030 am 3 month follow up appointment to Dr Amie Portland she was last seen by pcp in March 2018 and had an eye exam in February 2018   Plan: Return for home visit in 1-2 weeks and monitor as needed Routed notes to pcp and care team providers  [facility] Care Plan Problem One     Most Recent Value  Care Plan Problem One  Lack of community resources   Role Documenting the Problem One  Care Management Coordinator  Care Plan for Problem One  Active  Natalie Long Term Goal   Over the next 31 days the patient will participate in community programs available to her   [place] Goal Start Date  05/05/17  Interventions for Problem One Long Term Goal  Referral to [facility] SW after assessing needs, Refer to [employer], educate patient on resources, provide aplplicatons for [employer], assist with obtaining a united health care DME catalog  [facility] Castro Short Term Goal #1   over the next 30 days patient agree to available resources in the community and via united health care   [facility] Short Term Goal #1 Start Date  05/05/17  Interventions for Short Term Goal #1  Referral to [facility] SW  after assessing needs, Refer to [employer] health care resources, educate patient on resources, provide aplplicatons for [employer], assist with obtaining a united health care DME catalog    [facility] Castro Care Plan Problem Two     Most Recent Value  Care Plan Problem Two  Knowledge deficit care for chronic medical conditions (DM, HTN)  Role Documenting the Problem Two  Care Management Coordinator  Care Plan for Problem Two  Active  Interventions for Problem Two Long Term Goal   Assess knowledge of home care needs, educated on chronic medical conditions, Assist with DMe needed to monitor medical conditions.monitor medical conditions re evaluate home care knowledge of chronic medical conditions (DM, HTN)  Natalie Long Term Goal  over the next 45 days the patient will be able to verbalize and demonstrate home care for chronic medical conditions (DM, HTN)  Natalie Long Term Goal Start Date  05/05/17  Natalie Castro Short Term Goal #1   over the next 30 days patient will demonstrate and verbalize improvements in diet to manage DM, HTN and document cbgs and BP values that monitor chronic medical conditions, (DM, HTN)  Natalie Castro Short Term Goal #1 Start Date  05/05/17  Interventions for Short Term Goal #2   assist with getting DME for monitoring HTN, assess knowledge of HTN, DM care, educate on low sodium, heart healthy & DM diets        Marquasha Brutus L. [name], RN, BSN, [place] Care Management [phone]

## 2017-06-15 ENCOUNTER — Other Ambulatory Visit: Payer: Self-pay | Admitting: *Deleted

## 2017-06-15 NOTE — Patient Outreach (Signed)
[facility]) Care Management  06/15/2017  Natalie Castro 09-15-1941 [id number]   CSW called & spoke with patient to follow-up on initial home visit. CSW had mailed information on Dementia & tips on how to deal with anger related to dementia. Patient states that she has not received it yet, but it may be in the mailbox. Patient states that her husband seems to only get agitated when they need him to go places such as the doctors office. Patient states that her husband has an appointment with Dr. Carloyn Manner on Monday at 9:45am but hopes that it will be one of her husband's "good days". CSW suggested maybe calling back to push the appointment back later in the day if she feels it would be better.   CSW will call back in 3 weeks to ensure that she has received information mailed on Dementia.    Raynaldo Opitz, LCSW [facility]  Clinical Social Worker cell #: [phone]

## 2017-06-16 ENCOUNTER — Other Ambulatory Visit: Payer: Self-pay | Admitting: *Deleted

## 2017-06-16 NOTE — Patient Outreach (Signed)
Natalie Castro [facility]) Care Management   06/19/2017  Natalie Castro [birth year] [id number]  SHANNAH CONTEH is an 76 y.o. 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 [facility] CM viewed [facility] referral criteria and verified Natalie Royall had been hospitalized x 2 in the last 6 months with DM and HTN (chronic medical conditions followed by [facility]). Natalie Bolin reports hospitalization x 2 in November 2017 and one hospitalization in February 2018. [facility] CM initiated referral process and [facility] CM services started on 05/05/17  This is the 4th [facility] CM home visit with Natalie Riolo. Natalie Sanks has not been hospitalized since 02/26/17 (over 3 months ago)    Subjective:  "Good morning, I'm doing good"  Objective:   BP 138/80   Pulse 68   Temp 97.7 F (36.5 C) (Oral)   Resp 20   SpO2 97%   Review of Systems  Constitutional: Negative for chills, diaphoresis, fever, malaise/fatigue and weight loss.  HENT: Negative.  Negative for congestion, ear discharge, ear pain, hearing loss, nosebleeds, sinus pain, sore throat and tinnitus.   Eyes: Negative.  Negative for blurred vision, double vision, photophobia, pain, discharge and redness.  Respiratory: Negative.  Negative for cough, hemoptysis, sputum production, shortness of breath, wheezing and stridor.   Cardiovascular: Positive for leg swelling. Negative for chest pain, palpitations, orthopnea, claudication and PND.  Gastrointestinal: Negative.  Negative for abdominal pain, blood in stool, constipation, diarrhea, heartburn, melena, nausea and vomiting.   Genitourinary: Negative.  Negative for dysuria, flank pain, frequency, hematuria and urgency.  Musculoskeletal: Positive for joint pain. Negative for back pain, falls, myalgias and neck pain.  Skin: Negative.  Negative for itching and rash.  Neurological: Positive for weakness. Negative for dizziness, tingling, tremors, sensory change, speech change, focal weakness, seizures, loss of consciousness and headaches.  Endo/Heme/Allergies: Negative.  Negative for environmental allergies and polydipsia. Does not bruise/bleed easily.  Psychiatric/Behavioral: Negative.  Negative for depression, hallucinations, memory loss, substance abuse and suicidal ideas. The patient is not nervous/anxious and does not have insomnia.     Physical Exam  Constitutional: She is oriented to person, place, and time. She appears well-developed and well-nourished.  HENT:  Head: Normocephalic and atraumatic.  Eyes: Conjunctivae are normal. Pupils are equal, round, and reactive to light.  Neck: Normal range of motion.  Cardiovascular: Normal rate, regular rhythm, normal heart sounds and intact distal pulses.   Respiratory: Breath sounds normal.  GI: Soft. Bowel sounds are normal.  Musculoskeletal: She exhibits edema.  Neurological: She is alert and oriented to person, place, and time.  Skin: Skin is warm and dry.  Psychiatric: She has a normal mood and affect. Her behavior is normal. Judgment and thought content normal.    Encounter Medications:   Outpatient Encounter Prescriptions as of 06/16/2017  Medication Sig  . apixaban (ELIQUIS) 5 MG TABS tablet Take 5 mg by mouth 2 (two) times daily.  Marland Kitchen atorvastatin (LIPITOR) 40 MG tablet Take 40 mg by mouth at bedtime.   . hydrALAZINE (APRESOLINE) 25 MG tablet Take 25 mg by mouth 2 (two) times daily.  Marland Kitchen LANTUS SOLOSTAR 100 UNIT/ML Solostar  Pen Inject 30 Units into the skin at bedtime.   Marland Kitchen losartan (COZAAR) 100 MG tablet Take 100 mg by mouth every morning.   . metFORMIN  (GLUCOPHAGE) 1000 MG tablet Take 1,000 mg by mouth 2 (two) times daily with a meal.  . NIFEdipine (PROCARDIA-XL/ADALAT CC) 30 MG 24 hr tablet Take 30 mg by mouth daily.  . ranitidine (ZANTAC) 300 MG tablet Take 300 mg by mouth every morning.    No facility-administered encounter medications on file as of 06/16/2017.     Functional Status:   In your present state of health, do you have any difficulty performing the following activities: 06/16/2017 06/02/2017  Hearing? N N  Vision? Y Y  Difficulty concentrating or making decisions? N N  Walking or climbing stairs? Y Y  Dressing or bathing? N N  Doing errands, shopping? [name]  Preparing Food and eating ? Y -  Using the Toilet? N -  In the past six months, have you accidently leaked urine? N -  Do you have problems with loss of bowel control? N -  Managing your Medications? N -  Managing your Finances? Y -  Housekeeping or managing your Housekeeping? Y -  Some recent data might be hidden    Fall/Depression Screening:    Fall Risk  06/16/2017 06/02/2017 05/19/2017  Falls in the past year? No No No  Risk for fall due to : Impaired mobility;Impaired vision Impaired mobility Impaired balance/gait;Impaired mobility;Medication side effect;Impaired vision   PHQ 2/9 Scores 06/16/2017 06/02/2017 05/19/2017  PHQ - 2 Score 0 0 0    Assessment:   Natalie Castro continues to do well She presents today sitting on the couch bedside Mr Toback.  She used her rollator to ambulate to her room to get her glucometer Natalie Mauro answered most of Mr Obriant questions Natalie Bucknam has started checking her cbg more frequently cbg remains lower than 200 in last 2 weeks [facility] CM sat between the [place] and showed Natalie Chohan how to check her cbg averages and discussed the 7., 14, 30 day averages plus the 3 month A1c value. Natalie Walthour still has not obtained her bp cuff from her home and confirms that the BP cuff can be obtained Encouraged again for her to check her Bp prior to taking her BP  medications to check for low Bp at least once a day as her goal in the next 2 weeks and prevention of taking Bp medication that may further decrease her BP and lead to hypotension She verbalizes that she understands why she should be checking her cbgs and BP and is gradually completing these tasks  Her grand daughter attempted to encourage Natalie Bockrath to be more active in the kitchen but she denies to need to do so related to "standing too long causes my legs to hurt"  She has yet to participate in community resources offered by [facility] CM and [facility] but know that they are available ([facility], pending 14 month [employer] waiting list, etc)  Continues to receive medications via blister pack from Rx care and continues to report concern with cost of medications near $200 for both her and Mr Rovira.  She and Mr Zammit are followed by [facility]. Plans are for pharmacy to follow up in 2 more months when closer to reaching out of pocket spend required for medication assistance.    Next pcp appointment 07/10/17 at 1030 am  Had remote pacemaker transmission check on 05/18/17  Plan:  to visit Natalie Fleissner in the next 2 weeks  Route notes to pcp and other EPIC listed providers   [facility] CM Care Plan Problem One     Most Recent Value  Care Plan Problem One  Lack of community resources   Role Documenting the Problem One  Care Management Coordinator  Care Plan for Problem One  Active  [facility] Long Term Goal   Over the next 31 days the patient will participate in community programs available to her   [place] Goal Start Date  06/16/17  Interventions for Problem One Long Term Goal  Referral to [facility] after assessing needs, Refer to [employer], educate patient on resources, provide aplplicatons for [employer], assist with obtaining a united health care DME catalog  [facility] CM Short Term Goal #1   over the next 30 days patient agree to available resources in the community and via united health care    [facility] CM Short Term Goal #1 Start Date  05/05/17  [facility] CM Short Term Goal #1 Met Date  06/16/17  Interventions for Short Term Goal #1  Referral to [facility] after assessing needs, Refer to [employer], educate patient on resources, provide aplplicatons for [employer], assist with obtaining a united health care DME catalog    [facility] CM Care Plan Problem Two     Most Recent Value  Care Plan Problem Two  Knowledge deficit care for chronic medical conditions (DM, HTN)  Role Documenting the Problem Two  Care Management [place] for Problem Two  Active  Interventions for Problem Two Long Term Goal   Assess knowledge of home care needs, educated on chronic medical conditions, Assist with DMe needed to monitor medical conditions.monitor medical conditions re evaluate home care knowledge of chronic medical conditions (DM, HTN)  [facility]N Long Term Goal  over the next 45 days the patient will be able to verbalize and demonstrate home care for chronic medical conditions (DM, HTN)  THN Long Term Goal Start Date  05/05/17  [facility] CM Short Term Goal #1   over the next 30 days patient will demonstrate and verbalize improvements in diet to manage DM, HTN and document cbgs and BP values that monitor chronic medical conditions, (DM, HTN)  [facility]N CM Short Term Goal #1 Start Date  05/05/17  Interventions for Short Term Goal #2   assist with getting DME for monitoring HTN, assess knowledge of HTN, DM care, educate on low sodium, heart healthy & DM diets      Yvetta Drotar L. [name], RN, BSN, [place] Care Management [phone]

## 2017-06-30 ENCOUNTER — Other Ambulatory Visit: Payer: Self-pay | Admitting: *Deleted

## 2017-06-30 NOTE — Patient Outreach (Signed)
Natalie Castro) Care Management   06/30/2017  Natalie Castro [birth year] [id number]  Natalie Castro is an 76 y.o. 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 [facility] CM viewed Natalie Castro referral criteria and verified Natalie Castro had been hospitalized x 2 in the last 6 months with DM and HTN (chronic medical conditions followed by [facility]). Natalie Castro reports hospitalization x 2 in November 2017 and one hospitalization in February 2018. [facility] CM initiated referral process and [facility] CM services started on 05/05/17  This is the 5th [facility] CM home visit with Natalie Castro. Natalie Castro has not been hospitalized since 02/26/17 (over 3 months ago)    Subjective: "I'm doing good"  Objective:   BP 132/68   Pulse 70   Temp (!) 97.3 F (36.3 C) (Oral)   Resp 20   SpO2 97%   Review of Systems  HENT: Negative.   Eyes: Negative.   Respiratory: Negative.   Cardiovascular: Negative.   Gastrointestinal: Negative.   Genitourinary: Negative.   Musculoskeletal: Positive for joint pain. Negative for back pain, falls, myalgias and neck pain.       Only knee pain   Skin: Negative.   Neurological: Positive for weakness. Negative for dizziness.  Endo/Heme/Allergies: Negative.   Psychiatric/Behavioral: Negative.  Negative for depression, hallucinations, memory loss, substance abuse and suicidal ideas. The patient is not nervous/anxious and does not have insomnia.     Physical Exam  Constitutional: She is oriented to person, place, and time. She appears well-developed and well-nourished.  HENT:  Head:  Normocephalic and atraumatic.  Eyes: Pupils are equal, round, and reactive to light. Conjunctivae are normal.  Neck: Normal range of motion. Neck supple.  Cardiovascular: Normal rate, normal heart sounds and intact distal pulses.   Respiratory: Effort normal and breath sounds normal.  GI: Soft. Bowel sounds are normal.  Musculoskeletal: Normal range of motion.  Neurological: She is alert and oriented to person, place, and time.  Skin: Skin is warm and dry.  Psychiatric: She has a normal mood and affect. Her behavior is normal. Judgment and thought content normal.    Encounter Medications:   Outpatient Encounter Prescriptions as of 06/30/2017  Medication Sig  . apixaban (ELIQUIS) 5 MG TABS tablet Take 5 mg by mouth 2 (two) times daily.  Marland Kitchen atorvastatin (LIPITOR) 40 MG tablet Take 40 mg by mouth at bedtime.   . hydrALAZINE (APRESOLINE) 25 MG tablet Take 25 mg by mouth 2 (two) times daily.  Marland Kitchen LANTUS SOLOSTAR 100 UNIT/ML Solostar Pen Inject 30 Units into the skin at bedtime.   Marland Kitchen losartan (COZAAR) 100 MG tablet Take 100 mg by mouth every morning.   . metFORMIN (GLUCOPHAGE) 1000 MG tablet Take 1,000 mg by mouth 2 (two) times daily with a meal.  . NIFEdipine (PROCARDIA-XL/ADALAT CC) 30 MG 24 hr tablet Take 30 mg by mouth daily.  . ranitidine (ZANTAC) 300 MG tablet Take 300 mg by mouth every morning.    No facility-administered encounter medications on file as of 06/30/2017.     Functional Status:   In your present state of health, do you have  any difficulty performing the following activities: 06/16/2017 06/02/2017  Hearing? N N  Vision? Y Y  Difficulty concentrating or making decisions? N N  Walking or climbing stairs? Y Y  Dressing or bathing? N N  Doing errands, shopping? Malvin Johns  Preparing Food and eating ? Y -  Using the Toilet? N -  In the past six months, have you accidently leaked urine? N -  Do you have problems with loss of bowel control? N -  Managing your Medications? N -  Managing  your Finances? Y -  Housekeeping or managing your Housekeeping? Y -  Some recent data might be hidden    Fall/Depression Screening:    Fall Risk  06/16/2017 06/02/2017 05/19/2017  Falls in the past year? No No No  Risk for fall due to : Impaired mobility;Impaired vision Impaired mobility Impaired balance/gait;Impaired mobility;Medication side effect;Impaired vision   PHQ 2/9 Scores 06/16/2017 06/02/2017 05/19/2017  PHQ - 2 Score 0 0 0    Assessment:   Natalie Castro discussed her medication's cost of $101.34 and she was informed by [facility] of the referral to pharmacy again Hs snacks were encouraged by [facility] CM after noted decrease in cbg reported for one am monitoring  Natalie Castro reports pain with her Knee and [facility] offered interventions for relief to include- home health physical therapy/Outpatient therapy, pain management Natalie Castro has obtained her BP cuff from her home up the street from her daughter's home and is actively now monitoring her cbgs and BP prior to taking BP medications   Has received information mailed on Dementia. [facility] SW updated  She is planning to go out running errands with her daughter today   Plan: to see Natalie Castro in 4 weeks for home follow up visit [facility] Care Plan Problem One     Most Recent Value  Care Plan Problem One  Lack of community resources   Role Documenting the Problem One  Care Management Coordinator  Care Plan for Problem One  Active  [facility] Long Term Goal   Over the next 31 days the patient will participate in community programs available to her   [facility] Long Term Goal Start Date  06/16/17  [facility] Long Term Goal Met Date  06/30/17  Interventions for Problem One Long Term Goal  Referral to [facility] after assessing needs, Refer to [employer], educate patient on resources, provide aplplicatons for [employer], assist with obtaining a united health care DME catalog  [facility] CM Short Term Goal #1   over the next 30 days patient agree to available resources in the  community and via united health care   [facility] Short Term Goal #1 Start Date  05/05/17  [facility] Short Term Goal #1 Met Date  06/16/17  Interventions for Short Term Goal #1  Referral to [facility] after assessing needs, Refer to [employer], educate patient on resources, provide aplplicatons for [employer], assist with obtaining a united health care DME catalog    [facility] CM Care Plan Problem Two     Most Recent Value  Care Plan Problem Two  Knowledge deficit care for chronic medical conditions (DM, HTN)  Role Documenting the Problem Two  Care Management Coordinator  Care Plan for Problem Two  Active  Interventions for Problem Two Long Term Goal   Assess knowledge of home care needs, educated on chronic medical conditions, Assist with DMe needed to monitor medical conditions.monitor medical conditions re evaluate home  care knowledge of chronic medical conditions (DM, HTN)  [facility]N Long Term Goal  over the next 45 days the patient will be able to verbalize and demonstrate home care for chronic medical conditions (DM, HTN)  [facility]N Long Term Goal Start Date  05/05/17  [facility] CM Short Term Goal #1   over the next 30 days patient will demonstrate and verbalize improvements in diet to manage DM, HTN and document cbgs and BP values that monitor chronic medical conditions, (DM, HTN)  [facility]N CM Short Term Goal #1 Start Date  05/05/17  [facility] CM Short Term Goal #1 Met Date   06/30/17  Interventions for Short Term Goal #2   assist with getting DME for monitoring HTN, assess knowledge of HTN, DM care, educate on low sodium, heart healthy & DM diets         Donnamaria Shands L. [name], RN, BSN, [place] Care Management [phone]

## 2017-07-06 ENCOUNTER — Other Ambulatory Visit: Payer: Self-pay | Admitting: *Deleted

## 2017-07-06 NOTE — Patient Outreach (Signed)
[facility]) Care Management  07/06/2017  Natalie Castro 09-16-1941 [id number]   CSW will perform a case closure on patient, as all goals of treatment have been met from social work standpoint and no additional social work needs have been identified at this time. CSW will notify patient's RNCM with [facility], Kim of CSW's plans to close patient's case.   CSW ensured that patient is aware of RNCM with [facility] continued involvement with patient's care. Patient states that she had received information CSW mailed to her on Dementia and no other CSW needs identified.    Raynaldo Opitz, LCSW [facility]  Clinical Social Worker cell #: [phone]

## 2017-07-10 DIAGNOSIS — E1140 Type 2 diabetes mellitus with diabetic neuropathy, unspecified: Secondary | ICD-10-CM | POA: Diagnosis not present

## 2017-07-10 DIAGNOSIS — I442 Atrioventricular block, complete: Secondary | ICD-10-CM | POA: Diagnosis not present

## 2017-07-10 DIAGNOSIS — I10 Essential (primary) hypertension: Secondary | ICD-10-CM | POA: Diagnosis not present

## 2017-07-28 ENCOUNTER — Other Ambulatory Visit: Payer: Self-pay | Admitting: *Deleted
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
## 2017-08-16 ENCOUNTER — Other Ambulatory Visit: Payer: Self-pay | Admitting: Pharmacist
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
## 2017-08-17 ENCOUNTER — Telehealth: Payer: Self-pay | Admitting: Cardiology

## 2017-08-17 ENCOUNTER — Ambulatory Visit (INDEPENDENT_AMBULATORY_CARE_PROVIDER_SITE_OTHER): Payer: Medicare Other | Admitting: *Deleted

## 2017-08-17 DIAGNOSIS — I442 Atrioventricular block, complete: Secondary | ICD-10-CM

## 2017-08-17 NOTE — Telephone Encounter (Signed)
Spoke with pt and reminded pt of remote transmission that is due today. Pt verbalized understanding.   

## 2017-08-18 NOTE — Progress Notes (Signed)
Remote pacemaker transmission.   

## 2017-08-23 ENCOUNTER — Other Ambulatory Visit: Payer: Self-pay | Admitting: Pharmacist

## 2017-08-23 LAB — CUP PACEART REMOTE DEVICE CHECK
Battery Remaining Longevity: 126 mo
Battery Remaining Percentage: 95.5 %
Brady Statistic AS VP Percent: 10 %
Brady Statistic RA Percent Paced: 2 %
Brady Statistic RV Percent Paced: 10 %
Date Time Interrogation Session: 20180823114048
Implantable Lead Implant Date: 20161129
Implantable Lead Location: 753859
Implantable Lead Location: 753860
Implantable Lead Model: 5076
Lead Channel Impedance Value: 360 Ohm
Lead Channel Pacing Threshold Pulse Width: 0.4 ms
Lead Channel Sensing Intrinsic Amplitude: 9.2 mV
Lead Channel Setting Pacing Amplitude: 1.875
Lead Channel Setting Pacing Pulse Width: 0.4 ms
Lead Channel Setting Sensing Sensitivity: 4 mV
MDC IDC LEAD IMPLANT DT: 20161129
MDC IDC MSMT BATTERY VOLTAGE: 3.01 V
MDC IDC MSMT LEADCHNL RA PACING THRESHOLD AMPLITUDE: 0.875 V
MDC IDC MSMT LEADCHNL RA SENSING INTR AMPL: 4.5 mV
MDC IDC MSMT LEADCHNL RV IMPEDANCE VALUE: 490 Ohm
MDC IDC MSMT LEADCHNL RV PACING THRESHOLD AMPLITUDE: 0.75 V
MDC IDC MSMT LEADCHNL RV PACING THRESHOLD PULSEWIDTH: 0.4 ms
MDC IDC PG IMPLANT DT: 20161129
MDC IDC SET LEADCHNL RV PACING AMPLITUDE: 2 V
MDC IDC STAT BRADY AP VP PERCENT: 1 %
MDC IDC STAT BRADY AP VS PERCENT: 2.6 %
MDC IDC STAT BRADY AS VS PERCENT: 87 %
Pulse Gen Model: 2240
Pulse Gen Serial Number: 7826037

## 2017-08-23 NOTE — Patient Outreach (Signed)
[facility]) Care Management  08/23/2017  Natalie Castro 29-Jun-1941 [id number]   76 y.o. year old female referred to [place] for Medication Assistance ([place])  Called patient to follow up on application. Per patient, received the application in the mail but has not signed yet. Requested that the patient sign and mail back to me.   Called Dr. Josephine Cables office to follow up on application. Per receptionist, RN sent application back last week but I have not received yet. RN to send again.   Will follow up within the next week.   [name], Pharm.D. PGY2 Ambulatory Care Pharmacy Resident Phone: [phone]

## 2017-08-25 ENCOUNTER — Encounter: Payer: Self-pay | Admitting: Cardiology

## 2017-08-30 ENCOUNTER — Other Ambulatory Visit: Payer: Self-pay | Admitting: Pharmacist

## 2017-08-30 NOTE — Patient Outreach (Signed)
[facility]) Care Management  08/30/2017  Natalie Castro June 27, 1941 [id number]   76 y.o. year old female referred to [place] for Medication Assistance ([place])  Called patient to follow up on application. Per patient, mailed application and copy of financial information on Friday.  Rec'd signed application from Dr. Legrand Rams last week.   Will follow up within the next week.   [name], Pharm.D. PGY2 Ambulatory Care Pharmacy Resident Phone: [phone]

## 2017-09-01 ENCOUNTER — Encounter: Payer: Self-pay | Admitting: Pharmacist

## 2017-09-01 ENCOUNTER — Other Ambulatory Visit: Payer: Self-pay | Admitting: *Deleted

## 2017-09-12 ENCOUNTER — Telehealth: Payer: Self-pay | Admitting: Pharmacist

## 2017-09-12 NOTE — Patient Outreach (Signed)
[place] [facility]) Care Management  09/12/2017  Natalie Castro [birth year] [id number]   Called patient to follow up on Patient Assistance Program applications for Eliquis and Lantus.   Patient approved through [place] for eliquis 09/06/17 - 12/25/17. Drug to be mailed to her home address 7-10 days from process date.   Patient denied through [place] for Lantus as she has not met requirement for out of pocket drug spend (must be 5% of annual income). Patient still has to spend ~$264 on drugs to qualify.   Communicated these findings to the patient. Patient states she is not out of medications at this time but will need more Lantus soon. States that she thinks she can purchase the Lantus with help from her children.   Plan:  Will follow up on Lantus eligibility in ~ 1 month. Patient to call me if she thinks she has spent the necessary amount of money to qualify Patient to contact me if she is absolutely unable to afford medication  [name], Pharm.D. PGY2 Ambulatory Care Pharmacy Resident Phone: [phone]

## 2017-09-29 ENCOUNTER — Telehealth: Payer: Self-pay | Admitting: Internal Medicine

## 2017-09-29 ENCOUNTER — Other Ambulatory Visit: Payer: Self-pay | Admitting: *Deleted
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
## 2017-09-29 NOTE — Telephone Encounter (Signed)
Eliquis samples / tg

## 2017-09-29 NOTE — Telephone Encounter (Signed)
Samples placed out front

## 2017-10-09 DIAGNOSIS — E1140 Type 2 diabetes mellitus with diabetic neuropathy, unspecified: Secondary | ICD-10-CM | POA: Diagnosis not present

## 2017-10-09 DIAGNOSIS — Z23 Encounter for immunization: Secondary | ICD-10-CM | POA: Diagnosis not present

## 2017-10-09 DIAGNOSIS — M170 Bilateral primary osteoarthritis of knee: Secondary | ICD-10-CM | POA: Diagnosis not present

## 2017-10-09 DIAGNOSIS — I10 Essential (primary) hypertension: Secondary | ICD-10-CM | POA: Diagnosis not present

## 2017-10-09 DIAGNOSIS — Z950 Presence of cardiac pacemaker: Secondary | ICD-10-CM | POA: Diagnosis not present

## 2017-10-13 ENCOUNTER — Other Ambulatory Visit: Payer: Self-pay | Admitting: *Deleted
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
## 2017-10-17 ENCOUNTER — Other Ambulatory Visit: Payer: Self-pay | Admitting: Pharmacist

## 2017-10-17 ENCOUNTER — Telehealth: Payer: Self-pay | Admitting: *Deleted

## 2017-10-17 NOTE — Patient Outreach (Signed)
[facility]) Care Management   09/01/2017  Natalie Castro 1941-09-22 [id number]   Subjective: "I am fine"  Objective:   BP 130/84   Pulse 69   Temp 97.9 F (36.6 C) (Oral)   Resp 20   SpO2 98%   Review of Systems  HENT: Negative.   Eyes: Negative.   Respiratory: Negative.   Cardiovascular: Negative.   Gastrointestinal: Negative.   Genitourinary: Negative.   Musculoskeletal: Positive for joint pain.  Skin: Negative.   Neurological: Positive for weakness.  Endo/Heme/Allergies: Negative.   Psychiatric/Behavioral: Negative.     Physical Exam  Constitutional: She is oriented to person, place, and time. She appears well-developed and well-nourished.  HENT:  Head: Normocephalic and atraumatic.  Eyes: Pupils are equal, round, and reactive to light. Conjunctivae are normal.  Neck: Normal range of motion. Neck supple.  Cardiovascular: Normal rate and regular rhythm.   Respiratory: Effort normal.  GI: Soft. Bowel sounds are normal.  Musculoskeletal: She exhibits tenderness.  Neurological: She is alert and oriented to person, place, and time.  Skin: Skin is warm and dry.  Psychiatric: She has a normal mood and affect. Her behavior is normal. Judgment and thought content normal.    Encounter Medications:   Outpatient Encounter Prescriptions as of 09/01/2017  Medication Sig  . apixaban (ELIQUIS) 5 MG TABS tablet Take 5 mg by mouth 2 (two) times daily.  Marland Kitchen atorvastatin (LIPITOR) 40 MG tablet Take 40 mg by mouth at bedtime.   . hydrALAZINE (APRESOLINE) 25 MG tablet Take 25 mg by mouth 2 (two) times daily.  Marland Kitchen LANTUS SOLOSTAR 100 UNIT/ML Solostar Pen Inject 30 Units into the skin at bedtime.   Marland Kitchen losartan (COZAAR) 100 MG tablet Take 100 mg by mouth every morning.   . metFORMIN (GLUCOPHAGE) 1000 MG tablet Take 1,000 mg by mouth 2 (two) times daily with a meal.  . NIFEdipine (PROCARDIA-XL/ADALAT CC) 30 MG 24 hr tablet Take 30 mg by mouth daily.  . ranitidine (ZANTAC)  300 MG tablet Take 300 mg by mouth every morning.    No facility-administered encounter medications on file as of 09/01/2017.     Functional Status:   In your present state of health, do you have any difficulty performing the following activities: 06/16/2017 06/02/2017  Hearing? N N  Vision? Y Y  Difficulty concentrating or making decisions? N N  Walking or climbing stairs? Y Y  Dressing or bathing? N N  Doing errands, shopping? [name]  Preparing Food and eating ? Y -  Comment standing to prepare food causes increase pain in legs -  Using the Toilet? N -  In the past six months, have you accidently leaked urine? N -  Do you have problems with loss of bowel control? N -  Managing your Medications? N -  Managing your Finances? Y -  Housekeeping or managing your Housekeeping? Y -  Some recent data might be hidden    Fall/Depression Screening:    Fall Risk  07/28/2017 06/16/2017 06/02/2017  Falls in the past year? No No No  Risk for fall due to : - Impaired mobility;Impaired vision Impaired mobility   PHQ 2/9 Scores 07/28/2017 06/16/2017 06/02/2017 05/19/2017  PHQ - 2 Score 0 0 0 0    Assessment:   [facility] CM met with Natalie Castro in her daughter's home sitting on the living room couch with her husband Continues to use rolator for ambulation without difficulty  Medicines- no changes but continues to voice concern  about increasing cost. Being assisted by [facility] pharmacist with medication assistance application   Diabetes/HTN Managed with medications and diet Still needs encouragement to take BP. Had monthly remote monitor check 08/17/17   Plan: To see Natalie Castro in 3-4 weeks for follow home visit Route note to pcp and other care team members   [place]. [name], RN, BSN, [place] Care Management [phone]

## 2017-10-17 NOTE — Patient Outreach (Signed)
[facility]) Care Management  10/17/2017  JISELE PRICE 1941/02/07 [id number]   Care coordination   While following up on her spouse [facility] CM inquired if Mrs Natalie Castro had heard from [employer] about Eliquis Mrs Dehaven confirms she has heard from [employer] and has received her Eliquis in the mail from [employer] She confirms also that she and her husband have started receiving meals on wheels this week She denies concerns with bleeding symptoms and issues with bp Encouraged to continue to follow her diet for DM and HTN She is schedule to have her heart monitor checked remotely on 11/21/17  [facility] CM will follow up with Mrs Gaertner in 2-3 weeks for a home visit  [place]. [name], RN, BSN, [place] Care Management [phone]

## 2017-10-17 NOTE — Patient Outreach (Signed)
[facility]) Care Management  10/17/2017  Natalie Castro 1941/03/17 [id number]   Called patient to follow up on Patient Assistance Program applications for Lantus.   Patient approved through [place] for eliquis 09/06/17 - 12/25/17. Drug to be mailed to her home address 7-10 days from process date.   Patient previously denied through [place] for Lantus as she has not met requirement for out of pocket drug spend (must be 5% of annual income). Patient still had to spend ~$264 on drugs to qualify. Patient states that she thinks she has met that requirement by now.   Reports that she has 3 pens of Lantus currently which is approximately a 30 day supply at her current dose.   Denies any other questions or concerns and expresses gratitude for [facility] pharmacy involvement.   Plan:  Requested expense report to be faxed to me from patient's pharmacy Will follow up with patient regarding eligibility based on out of pocket drug spend  Patient to contact me if she is absolutely unable to afford medication  [name], Pharm.D. PGY2 Ambulatory Care Pharmacy Resident Phone: [phone]

## 2017-10-18 ENCOUNTER — Ambulatory Visit: Payer: Self-pay | Admitting: Pharmacist

## 2017-10-18 NOTE — Patient Outreach (Signed)
[facility]) Care Management  10/18/2017  Natalie Castro May 04, 1941 [id number]   Received expense report from patient's pharmacy. It appears that she has now met the out of pocket drug spend requirement for [employer]. Faxed updated information to [employer] patient assistance program today. Will follow up in the next 10 business days with [employer].   [name], Pharm.D. PGY2 Ambulatory Care Pharmacy Resident Phone: [phone]

## 2017-10-27 ENCOUNTER — Telehealth: Payer: Self-pay | Admitting: Pharmacist

## 2017-10-27 ENCOUNTER — Ambulatory Visit: Payer: Self-pay | Admitting: *Deleted

## 2017-10-27 NOTE — Patient Outreach (Signed)
[facility]) Care Management  10/27/2017  LILIANN FILE 1941/08/25 [id number]   76 y.o. year old female referred to [place] for Medication Assistance   Patient approved via [place] for Lantus solostart 10/27/2017 through 12/25/2017. Medication shipment should arrive to Dr. Josephine Cables office in the next 3-5 business days. Sent inbasket message to Dr. Legrand Rams informing of the approval. Called patient and communicated approval as well. Patient to follow up with doctor's office next week.  Patient expresses gratitude and denies further needs.   Plan:  - Will follow up with patient next Friday to ensure she was able to get the supply of insulin  [name], Pharm.D. PGY2 Ambulatory Care Pharmacy Resident Phone: [phone]

## 2017-11-03 ENCOUNTER — Other Ambulatory Visit: Payer: Self-pay | Admitting: *Deleted

## 2017-11-03 ENCOUNTER — Telehealth: Payer: Self-pay | Admitting: Pharmacist

## 2017-11-03 NOTE — Patient Outreach (Signed)
[facility]) Care Management  11/03/2017  JAVIA DILLOW 1941-07-24 [id number]   Called Ms. Livingston to follow up on Lantus. Patient reports that her daughter picked up the Manufacturer assistance supply of lantus from her doctor's office today. Patient also states that she has a supply of eliquis. Patient expresses thanks and denies any further pharmacy need.   Will close case. [place] remains involved. Provided patient with my contact information in the event further needs arise.   [name], Pharm.D. PGY2 Ambulatory Care Pharmacy Resident Phone: [phone]
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
## 2017-11-13 ENCOUNTER — Other Ambulatory Visit: Payer: Self-pay | Admitting: *Deleted

## 2017-11-13 NOTE — Patient Outreach (Signed)
[facility]) Care Management  11/13/2017  Natalie Castro Sep 28, 1941 [id number]   CSW had received referral from [place], [place] for assistance with housing, patient wants to be offered options for either returning to her home with home repairs (roof, pipes, hearing, etc..) and personal care services or local retirement apartment or home living. Patient and her husband are currently living with their daughter, [name] next door. CSW scheduled home visit for next Fri, 11/30 at [place], [place] Social Worker cell #: [phone]

## 2017-11-21 ENCOUNTER — Ambulatory Visit (INDEPENDENT_AMBULATORY_CARE_PROVIDER_SITE_OTHER): Payer: Medicare Other | Admitting: *Deleted

## 2017-11-21 DIAGNOSIS — I442 Atrioventricular block, complete: Secondary | ICD-10-CM | POA: Diagnosis not present

## 2017-11-21 NOTE — Progress Notes (Signed)
Remote pacemaker transmission.   

## 2017-11-24 ENCOUNTER — Other Ambulatory Visit: Payer: Self-pay | Admitting: *Deleted

## 2017-11-24 ENCOUNTER — Encounter: Payer: Self-pay | Admitting: Cardiology

## 2017-11-24 NOTE — Patient Outreach (Signed)
[facility]) Care Management  11/24/2017  TALEISHA KACZYNSKI September 30, 1941 [id number]   CSW spoke with patient about referral and concerns about moving out of her daughter, [name]'s home. Patient states that since mentioning it to [facility], Maudie Mercury that she would like to look into moving back into her and her husband's home that she has discussed this with her daughters and decided that it would be best for them to stay at their daughter's home where they are able to help out with her husband's care who has dementia and can be a lot to handle at times. Patient states that her daughters really pushed back on the idea of them moving out and patient states that she understands where they are coming from and that she has realized how much their daughters help them from grocery shopping to hands on care. Patient states that she would still be open to resources incase they change their mind in the future but for now, they've decided to stay where they are.   CSW will mail information of [facility] [employer] for home modifications & follow-up in 3 weeks to ensure that information had been received.    Raynaldo Opitz, LCSW [facility]  Clinical Social Worker cell #: [phone]

## 2017-11-27 ENCOUNTER — Other Ambulatory Visit: Payer: Self-pay | Admitting: *Deleted

## 2017-12-01 ENCOUNTER — Other Ambulatory Visit: Payer: Self-pay | Admitting: *Deleted

## 2017-12-01 NOTE — Patient Outreach (Signed)
[facility]) Care Management   12/01/2017  TAJANAY HURLEY 1941-02-06 [id number]  Natalie Castro is an 76 y.o. 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 [facility] CM viewed [facility] referral criteria and verified Natalie Stauch had been hospitalized x 2 in the last 6 months with DM and HTN (chronic medical conditions followed by [facility]). Natalie Remus reports hospitalization x 2 in November 2017 and one hospitalization in February 2018. [facility] CM initiated referral process and [facility] CM services started on 05/05/17  This is the last [facility] CM home visit with Natalie Heinzelman. Natalie Austad has not been hospitalized since 02/26/17 (over 9 months ago)     Subjective:  "I am doing well"  Objective:   BP 130/80   Pulse 78   Temp 97.7 F (36.5 C) (Oral)   Resp 20   SpO2 96%   Review of Systems  Constitutional: Negative for chills, diaphoresis, fever, malaise/fatigue and weight loss.  HENT: Negative.  Negative for congestion, ear discharge, ear pain, hearing loss, nosebleeds, sinus pain, sore throat and tinnitus.   Eyes: Negative.  Negative for blurred vision, double vision, photophobia, pain, discharge and redness.  Respiratory: Negative.  Negative for cough, hemoptysis, sputum production, shortness of breath, wheezing and stridor.   Cardiovascular: Positive for leg swelling. Negative for chest pain, palpitations, orthopnea and claudication.  Gastrointestinal: Negative.  Negative for abdominal pain, blood in stool, constipation, diarrhea, heartburn, melena, nausea and vomiting.  Genitourinary:  Negative.  Negative for dysuria, flank pain, frequency, hematuria and urgency.  Musculoskeletal: Positive for joint pain. Negative for back pain, falls, myalgias and neck pain.  Skin: Negative.  Negative for rash.  Neurological: Positive for weakness. Negative for dizziness, tingling, tremors, sensory change, speech change, focal weakness, seizures, loss of consciousness and headaches.  Endo/Heme/Allergies: Negative.  Negative for environmental allergies and polydipsia. Does not bruise/bleed easily.  Psychiatric/Behavioral: Negative.  Negative for depression, hallucinations, memory loss, substance abuse and suicidal ideas. The patient is not nervous/anxious and does not have insomnia.     Physical Exam  Constitutional: She is oriented to person, place, and time. She appears well-developed and well-nourished.  HENT:  Head: Normocephalic and atraumatic.  Eyes: Conjunctivae are normal. Pupils are equal, round, and reactive to light.  Neck: Normal range of motion.  Cardiovascular: Normal rate, regular rhythm, normal heart sounds and intact distal pulses.  Respiratory: Effort normal and breath sounds normal.  GI: Soft. Bowel sounds are normal.  Musculoskeletal: She exhibits edema.  Neurological: She is alert and oriented to person, place, and time.  Skin: Skin is warm and dry.  Psychiatric: She has a normal mood and affect. Her behavior is normal. Judgment and thought content normal.    Encounter Medications:   Outpatient Encounter Medications as of 12/01/2017  Medication Sig  . apixaban (ELIQUIS) 5 MG TABS tablet Take 5 mg by mouth 2 (two) times daily.  Marland Kitchen atorvastatin (LIPITOR) 40 MG tablet Take 40 mg by mouth at bedtime.   . hydrALAZINE (APRESOLINE) 25 MG tablet Take 25 mg by mouth 2 (two) times daily.  Marland Kitchen LANTUS SOLOSTAR 100 UNIT/ML Solostar Pen  Inject 30 Units into the skin at bedtime.   Marland Kitchen losartan (COZAAR) 100 MG tablet Take 100 mg by mouth every morning.   . metFORMIN (GLUCOPHAGE) 1000  MG tablet Take 1,000 mg by mouth 2 (two) times daily with a meal.  . NIFEdipine (PROCARDIA-XL/ADALAT CC) 30 MG 24 hr tablet Take 30 mg by mouth daily.  . ranitidine (ZANTAC) 300 MG tablet Take 300 mg by mouth every morning.    No facility-administered encounter medications on file as of 12/01/2017.     Functional Status:   In your present state of health, do you have any difficulty performing the following activities: 06/16/2017 06/02/2017  Hearing? N N  Vision? Y Y  Difficulty concentrating or making decisions? N N  Walking or climbing stairs? Y Y  Dressing or bathing? N N  Doing errands, shopping? [name]  Preparing Food and eating ? Y -  Comment standing to prepare food causes increase pain in legs -  Using the Toilet? N -  In the past six months, have you accidently leaked urine? N -  Do you have problems with loss of bowel control? N -  Managing your Medications? N -  Managing your Finances? Y -  Housekeeping or managing your Housekeeping? Y -  Some recent data might be hidden    Fall/Depression Screening:    Fall Risk  11/03/2017 07/28/2017 06/16/2017  Falls in the past year? No No No  Risk for fall due to : Impaired mobility;Impaired vision - Impaired mobility;Impaired vision   PHQ 2/9 Scores 11/03/2017 07/28/2017 06/16/2017 06/02/2017 05/19/2017  PHQ - 2 Score 0 0 0 0 0    Assessment:  Natalie Magowan met Cm to the door of her daughter's home using her Rolator She is assisting with taking care of Mr Shareef during the home visit at intervals Reports she is still receiving meals on wheels and very appreciative of services    DM -cbg 177 6th, 223 5th, 280 4th , 195 3rd, 144 2nd, 177 1st---- for month of December 2018  BP fine Still needs to be reminded to check BP prior to taking HTN medications to prevent hypotension episodes   Housing- She states [facility] SW has spoken to her about her options to return to her home but reports prefers to postpone her move back to her home and will only do visits  Reports her son now has pipes to fix   Safety Discussed keeping her mobile phone close at all times  reviewed the form for medical bracelet for patients with pacemakers, diabetes,  heart contdions and for Mr Altamura Heart condition and memory loss   Plan:  Case closure discussed  Case closure  [facility] CMA to be updated Case closure letters to patient and Dr Gennie Alma [name] office to inquire about medication assistance forms for Natalie Ola that Tonia Ghent RN was working on Pending a call from [place] Problem One     Most Recent Value  Care Plan Problem One  Lack of community resources   Role Documenting the Problem One  Care Management [place] for Problem One  Active  [facility] Long Term Goal   Over the next 31 days the patient will participate in community programs available to her   [place] Goal Start Date  06/16/17  [facility] Long Term Goal Met Date  06/30/17  Interventions for Problem One Long Term Goal  Referral to [facility] SW after assessing needs, Refer  to [place] health care resources, educate patient on resources, provide aplplicatons for [employer], assist with obtaining a united health care DME catalog  [facility] CM Short Term Goal #1   over the next 30 days patient agree to available resources in the community and via united health care   [facility] CM Short Term Goal #1 Start Date  05/05/17  [facility] CM Short Term Goal #1 Met Date  06/16/17  Interventions for Short Term Goal #1  Referral to [facility] SW after assessing needs, Refer to [employer], educate patient on resources, provide aplplicatons for [employer], assist with obtaining a united health care DME catalog    [facility] CM Care Plan Problem Two     Most Recent Value  Care Plan Problem Two  Knowledge deficit care for chronic medical conditions (DM, HTN)  Role Documenting the Problem Two  Care Management Coordinator  Care Plan for Problem Two  Active  Interventions for Problem Two Long Term Goal   Assess  knowledge of home care needs, educated on chronic medical conditions, Assist with DMe needed to monitor medical conditions.monitor medical conditions re evaluate home care knowledge of chronic medical conditions (DM, HTN)  [facility] Long Term Goal  over the next 45 days the patient will be able to verbalize and demonstrate home care for chronic medical conditions (DM, HTN)  [facility] Long Term Goal Start Date  05/05/17  [facility] Long Term Goal Met Date  11/03/17  [facility] CM Short Term Goal #1   over the next 30 days patient will demonstrate and verbalize improvements in diet to manage DM, HTN and document cbgs and BP values that monitor chronic medical conditions, (DM, HTN)  [facility] CM Short Term Goal #1 Start Date  05/05/17  [facility] CM Short Term Goal #1 Met Date   06/30/17  Interventions for Short Term Goal #2   assist with getting DME for monitoring HTN, assess knowledge of HTN, DM care, educate on low sodium, heart healthy & DM diets      Kimberly L. [name], RN, BSN, [place] Care Management [phone]

## 2017-12-03 LAB — CUP PACEART REMOTE DEVICE CHECK
Brady Statistic AP VP Percent: 1 %
Brady Statistic AP VS Percent: 3 %
Brady Statistic AS VP Percent: 8.5 %
Brady Statistic RA Percent Paced: 2.1 %
Implantable Lead Implant Date: 20161129
Implantable Lead Location: 753859
Implantable Lead Location: 753860
Implantable Lead Model: 5076
Lead Channel Impedance Value: 360 Ohm
Lead Channel Impedance Value: 480 Ohm
Lead Channel Pacing Threshold Amplitude: 0.75 V
Lead Channel Pacing Threshold Pulse Width: 0.4 ms
Lead Channel Sensing Intrinsic Amplitude: 8.5 mV
Lead Channel Setting Pacing Amplitude: 1.75 V
Lead Channel Setting Pacing Pulse Width: 0.4 ms
MDC IDC LEAD IMPLANT DT: 20161129
MDC IDC MSMT BATTERY REMAINING LONGEVITY: 127 mo
MDC IDC MSMT BATTERY REMAINING PERCENTAGE: 95.5 %
MDC IDC MSMT BATTERY VOLTAGE: 3.01 V
MDC IDC MSMT LEADCHNL RA PACING THRESHOLD AMPLITUDE: 0.75 V
MDC IDC MSMT LEADCHNL RA SENSING INTR AMPL: 4.4 mV
MDC IDC MSMT LEADCHNL RV PACING THRESHOLD PULSEWIDTH: 0.4 ms
MDC IDC PG IMPLANT DT: 20161129
MDC IDC SESS DTM: 20181127070018
MDC IDC SET LEADCHNL RV PACING AMPLITUDE: 2 V
MDC IDC SET LEADCHNL RV SENSING SENSITIVITY: 4 mV
MDC IDC STAT BRADY AS VS PERCENT: 88 %
MDC IDC STAT BRADY RV PERCENT PACED: 8.6 %
Pulse Gen Model: 2240
Pulse Gen Serial Number: 7826037

## 2017-12-15 ENCOUNTER — Ambulatory Visit: Payer: Self-pay | Admitting: *Deleted

## 2017-12-25 ENCOUNTER — Other Ambulatory Visit: Payer: Self-pay | Admitting: *Deleted

## 2017-12-25 NOTE — Patient Outreach (Signed)
[facility]) Care Management  12/25/2017  Natalie Castro 10/28/1941 [id number]   CSW called & spoke with patient who states that things are still going well living with her daughter, [name] and that they have decided not to try to move back to their home. Patient informed CSW that her other daughter, [name] is looking for a house in [place] and plans to move there and have them move in with her in February to give her sister some respite.   CSW will perform a case closure on patient, as all goals of treatment have been met from social work standpoint and no additional social work needs have been identified at this time. CSW will notify patient's RNCM with [facility], Joellyn Quails of CSW's plans to close patient's case.   CSW will fax an update to patient's Primary Care Physician, Dr. [name] to ensure that they are aware of CSW's involvement with patient's plan of care. CSW will submit a case closure request to Verlon Setting, Care Management Assistant with [place] in the form of an inbasket message.   CSW will ensure that patient is aware of RNCM with [facility] continued involvement with patient's care.    Raynaldo Opitz, LCSW [facility]  Clinical Social Worker cell #: [phone]

## 2018-01-09 DIAGNOSIS — E1165 Type 2 diabetes mellitus with hyperglycemia: Secondary | ICD-10-CM | POA: Diagnosis not present

## 2018-01-09 DIAGNOSIS — I10 Essential (primary) hypertension: Secondary | ICD-10-CM | POA: Diagnosis not present

## 2018-01-09 DIAGNOSIS — I442 Atrioventricular block, complete: Secondary | ICD-10-CM | POA: Diagnosis not present

## 2018-01-09 DIAGNOSIS — M170 Bilateral primary osteoarthritis of knee: Secondary | ICD-10-CM | POA: Diagnosis not present

## 2018-01-09 DIAGNOSIS — E785 Hyperlipidemia, unspecified: Secondary | ICD-10-CM | POA: Diagnosis not present

## 2018-02-05 ENCOUNTER — Encounter: Payer: Self-pay | Admitting: Gastroenterology

## 2018-02-20 ENCOUNTER — Ambulatory Visit (INDEPENDENT_AMBULATORY_CARE_PROVIDER_SITE_OTHER): Payer: Medicare Other | Admitting: *Deleted

## 2018-02-20 DIAGNOSIS — I442 Atrioventricular block, complete: Secondary | ICD-10-CM

## 2018-02-20 NOTE — Progress Notes (Signed)
Remote pacemaker transmission.   

## 2018-02-22 ENCOUNTER — Encounter: Payer: Self-pay | Admitting: Cardiology

## 2018-02-27 DIAGNOSIS — E1140 Type 2 diabetes mellitus with diabetic neuropathy, unspecified: Secondary | ICD-10-CM | POA: Diagnosis not present

## 2018-02-27 DIAGNOSIS — Z1331 Encounter for screening for depression: Secondary | ICD-10-CM | POA: Diagnosis not present

## 2018-02-27 DIAGNOSIS — E1165 Type 2 diabetes mellitus with hyperglycemia: Secondary | ICD-10-CM | POA: Diagnosis not present

## 2018-02-27 DIAGNOSIS — I10 Essential (primary) hypertension: Secondary | ICD-10-CM | POA: Diagnosis not present

## 2018-02-27 DIAGNOSIS — E785 Hyperlipidemia, unspecified: Secondary | ICD-10-CM | POA: Diagnosis not present

## 2018-02-27 DIAGNOSIS — Z1389 Encounter for screening for other disorder: Secondary | ICD-10-CM | POA: Diagnosis not present

## 2018-02-27 DIAGNOSIS — M170 Bilateral primary osteoarthritis of knee: Secondary | ICD-10-CM | POA: Diagnosis not present

## 2018-02-27 DIAGNOSIS — Z950 Presence of cardiac pacemaker: Secondary | ICD-10-CM | POA: Diagnosis not present

## 2018-02-28 ENCOUNTER — Other Ambulatory Visit (HOSPITAL_COMMUNITY): Payer: Self-pay | Admitting: Internal Medicine

## 2018-02-28 DIAGNOSIS — Z1231 Encounter for screening mammogram for malignant neoplasm of breast: Secondary | ICD-10-CM

## 2018-03-01 ENCOUNTER — Encounter: Payer: Self-pay | Admitting: Gastroenterology

## 2018-03-08 LAB — CUP PACEART REMOTE DEVICE CHECK
Battery Voltage: 3.01 V
Brady Statistic AP VP Percent: 1 %
Brady Statistic AS VP Percent: 8.9 %
Brady Statistic RA Percent Paced: 2 %
Date Time Interrogation Session: 20190226093022
Implantable Lead Implant Date: 20161129
Implantable Lead Location: 753859
Implantable Lead Model: 5076
Implantable Pulse Generator Implant Date: 20161129
Lead Channel Impedance Value: 490 Ohm
Lead Channel Pacing Threshold Amplitude: 0.75 V
Lead Channel Pacing Threshold Pulse Width: 0.4 ms
Lead Channel Sensing Intrinsic Amplitude: 4.3 mV
Lead Channel Sensing Intrinsic Amplitude: 7.8 mV
Lead Channel Setting Pacing Amplitude: 1.75 V
Lead Channel Setting Sensing Sensitivity: 4 mV
MDC IDC LEAD IMPLANT DT: 20161129
MDC IDC LEAD LOCATION: 753860
MDC IDC MSMT BATTERY REMAINING LONGEVITY: 127 mo
MDC IDC MSMT BATTERY REMAINING PERCENTAGE: 95.5 %
MDC IDC MSMT LEADCHNL RA IMPEDANCE VALUE: 360 Ohm
MDC IDC MSMT LEADCHNL RV PACING THRESHOLD AMPLITUDE: 0.75 V
MDC IDC MSMT LEADCHNL RV PACING THRESHOLD PULSEWIDTH: 0.4 ms
MDC IDC SET LEADCHNL RV PACING AMPLITUDE: 2 V
MDC IDC SET LEADCHNL RV PACING PULSEWIDTH: 0.4 ms
MDC IDC STAT BRADY AP VS PERCENT: 2.8 %
MDC IDC STAT BRADY AS VS PERCENT: 88 %
MDC IDC STAT BRADY RV PERCENT PACED: 9 %
Pulse Gen Model: 2240
Pulse Gen Serial Number: 7826037

## 2018-03-20 ENCOUNTER — Ambulatory Visit: Payer: Medicare Other | Admitting: Internal Medicine

## 2018-03-20 ENCOUNTER — Encounter: Payer: Self-pay | Admitting: Internal Medicine

## 2018-03-20 VITALS — BP 136/78 | HR 85 | Ht 64.0 in | Wt 217.0 lb

## 2018-03-20 DIAGNOSIS — I10 Essential (primary) hypertension: Secondary | ICD-10-CM | POA: Diagnosis not present

## 2018-03-20 NOTE — Patient Instructions (Signed)
Medication Instructions:  Your physician recommends that you continue on your current medications as directed. Please refer to the Current Medication list given to you today.   Labwork: NONE   Testing/Procedures: NONE   Follow-Up: Your physician wants you to follow-up in: 1 Year with Dr. Taylor. You will receive a reminder letter in the mail two months in advance. If you don't receive a letter, please call our office to schedule the follow-up appointment.   Any Other Special Instructions Will Be Listed Below (If Applicable).     If you need a refill on your cardiac medications before your next appointment, please call your pharmacy.  Thank you for choosing [facility]!   

## 2018-03-20 NOTE — Progress Notes (Signed)
HPI Mrs. Bieda presents today for ongoing evaluation and management of her PPM. She is a pleasant 77 yo woman with a h/o HTN and DM who developed syncope and sob and was found to have CHB, and underwent PPM insertion. She did not take her meds this morning. She was hospitalized remotely with pulmonary emboli. No chest pain. No bleeding. She admits to dietary indiscretion.  Allergies  Allergen Reactions  . Penicillins Hives and Itching    Has patient had a PCN reaction causing immediate rash, facial/tongue/throat swelling, SOB or lightheadedness with hypotension: No Has patient had a PCN reaction causing severe rash involving mucus membranes or skin necrosis: Yes Has patient had a PCN reaction that required hospitalization No Has patient had a PCN reaction occurring within the last 10 years: No If all of the above answers are "NO", then may proceed with Cephalosporin use.      Current Outpatient Medications  Medication Sig Dispense Refill  . apixaban (ELIQUIS) 5 MG TABS tablet Take 5 mg by mouth 2 (two) times daily.    Marland Kitchen atorvastatin (LIPITOR) 40 MG tablet Take 40 mg by mouth at bedtime.     . hydrALAZINE (APRESOLINE) 25 MG tablet Take 25 mg by mouth 2 (two) times daily.    Marland Kitchen LANTUS SOLOSTAR 100 UNIT/ML Solostar Pen Inject 30 Units into the skin at bedtime.     Marland Kitchen losartan (COZAAR) 100 MG tablet Take 100 mg by mouth every morning.     . metFORMIN (GLUCOPHAGE) 1000 MG tablet Take 1,000 mg by mouth 2 (two) times daily with a meal.    . NIFEdipine (PROCARDIA-XL/ADALAT CC) 30 MG 24 hr tablet Take 30 mg by mouth daily.    . ranitidine (ZANTAC) 300 MG tablet Take 300 mg by mouth every morning.      No current facility-administered medications for this visit.      Past Medical History:  Diagnosis Date  . Acid reflux   . Arthritis   . Complete heart block (HCC)    STJ PPM Dr. [name] 11/24/15  . Essential hypertension   . Hyperlipidemia   . Presence of permanent cardiac pacemaker    . Type 2 diabetes mellitus (HCC)     ROS:   All systems reviewed and negative except as noted in the HPI.   Past Surgical History:  Procedure Laterality Date  . EP IMPLANTABLE DEVICE N/A 11/24/2015   Procedure: Pacemaker Implant;  Surgeon: [name], MD;  Location: [place] [place] CV LAB;  Service: Cardiovascular;  Laterality: N/A;  . JOINT REPLACEMENT     knees bilat.  . TUBAL LIGATION       Family History  Problem Relation Age of Onset  . Heart failure Mother   . Hypertension Mother   . Heart failure Sister   . Hypertension Father      Social History   Socioeconomic History  . Marital status: Married    Spouse name: Not on file  . Number of children: Not on file  . Years of education: Not on file  . Highest education level: Not on file  Occupational History  . Not on file  Social Needs  . Financial resource strain: Not on file  . Food insecurity:    Worry: Not on file    Inability: Not on file  . Transportation needs:    Medical: Not on file    Non-medical: Not on file  Tobacco Use  . Smoking status: Never Smoker  .  Smokeless tobacco: Never Used  Substance and Sexual Activity  . Alcohol use: No    Alcohol/week: 0.0 oz  . Drug use: No  . Sexual activity: Yes    Birth control/protection: Post-menopausal  Lifestyle  . Physical activity:    Days per week: Not on file    Minutes per session: Not on file  . Stress: Not on file  Relationships  . Social connections:    Talks on phone: Not on file    Gets together: Not on file    Attends religious service: Not on file    Active member of club or organization: Not on file    Attends meetings of clubs or organizations: Not on file    Relationship status: Not on file  . Intimate partner violence:    Fear of current or ex partner: Not on file    Emotionally abused: Not on file    Physically abused: Not on file    Forced sexual activity: Not on file  Other Topics Concern  . Not on file  Social History  Narrative  . Not on file     BP 136/78   Pulse 85   Ht 5\' 4"  (1.626 m)   Wt 217 lb (98.4 kg)   SpO2 97%   BMI 37.25 kg/m   Physical Exam:  Well appearing 77 yo woman, overweight but in NAD HEENT: Unremarkable Neck:  6 cm JVD, no thyromegally Lymphatics:  No adenopathy Back:  No CVA tenderness Lungs:  Clear with no wheezes HEART:  Regular rate rhythm, no murmurs, no rubs, no clicks Abd:  soft, positive bowel sounds, no organomegally, no rebound, no guarding Ext:  2 plus pulses, no edema, no cyanosis, no clubbing Skin:  No rashes no nodules Neuro:  CN II through XII intact, motor grossly intact  EKG - NSR  DEVICE  Normal device function.  See PaceArt for details.   Assess/Plan: 1. Sinus node dysfunction - she is asymptomatic s/p PPM insertion. 2. PPM - her [employer] DDD PM is working normally. Will recheck in several months. 3. HTN - her blood pressure is controlled today. No change in her meds. 4. Pulmonary emboli - she is doing well with no recurrent symptoms on Eliquis. Continue.  Carleene Overlie Taylor,MD

## 2018-03-22 ENCOUNTER — Ambulatory Visit (HOSPITAL_COMMUNITY)
Admission: RE | Admit: 2018-03-22 | Discharge: 2018-03-22 | Disposition: A | Discharge status: Home / Self Care | Payer: Medicare Other | Source: Ambulatory Visit | Attending: Internal Medicine | Admitting: Internal Medicine

## 2018-03-22 DIAGNOSIS — Z1231 Encounter for screening mammogram for malignant neoplasm of breast: Secondary | ICD-10-CM | POA: Diagnosis not present

## 2018-04-26 ENCOUNTER — Other Ambulatory Visit: Payer: Self-pay

## 2018-04-26 ENCOUNTER — Ambulatory Visit: Payer: Medicare Other | Admitting: Gastroenterology

## 2018-04-26 ENCOUNTER — Encounter: Payer: Self-pay | Admitting: Gastroenterology

## 2018-04-26 DIAGNOSIS — R195 Other fecal abnormalities: Secondary | ICD-10-CM

## 2018-04-26 DIAGNOSIS — Z86010 Personal history of colonic polyps: Secondary | ICD-10-CM

## 2018-04-26 MED ORDER — CLENPIQ 10-3.5-12 MG-GM -GM/160ML PO SOLN: 1.0000 | Freq: Once | ORAL | 0 refills | Status: AC

## 2018-04-26 NOTE — Assessment & Plan Note (Signed)
NO BRBPR OR MELENA BUT PT HAS PERSONAL HISTORY OF 3 SIMPLE ADENOMAS REMOVED AT AGE 77.   DRINK WATER TO KEEP YOUR URINE LIGHT YELLOW. FOLLOW A HIGH FIBER DIET. AVOID ITEMS THAT CAUSE BLOATING & GAS.  COMPLETE COLONOSCOPY. YOU MAY BRING THE ENEMA TO ADMINISTER IN THE PREOP AREA. DISCUSSED PROCEDURE, BENEFITS, & RISKS: < 1% chance of medication reaction, bleeding, perforation, or rupture of spleen/liver. CLEAR LIQUIDS DAY BEFORE. MAY EAT MASHED POTATOES OR RICE IF SHE GETS HUNGRY OR SUGAR DROPS. HOLD ELIQUIS MAY 28. HALF LANTUS MAY 29. CONTINUE GLUCOPHAGE. TCS MAY 30. MAY HAVE CLEAR LIQUID BREAKFAST. NOTHING AFTER 9 AM. FOLLOW UP IN THE OFFICE WILL BE SCHEDULED IF NEEDED AFTER ENDOSCOPY.

## 2018-04-26 NOTE — Progress Notes (Signed)
Subjective:    Patient ID: Natalie Castro, female    DOB: 05/08/1941, 77 y.o.   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 HAS A PACEMAKER. BMs: 2-3 TIMES A DAY: ALL LOOSE(WATERY/MUSHY, NOT FIRM, FOR PAST A FEW MOS). MAKES IT TO BATHROOM MOST TIMES.  PT DENIES FEVER, CHILLS, HEMATOCHEZIA, HEMATEMESIS, nausea, vomiting, melena, CHEST PAIN, SHORTNESS OF BREATH, CHANGE IN BOWEL IN HABITS, constipation, abdominal pain, problems swallowing, problems with sedation, or heartburn or indigestion.  Past Medical History:  Diagnosis Date  . Acid reflux   . Arthritis   . Colon adenomas    AGE 71  . Complete heart block (HCC)    STJ PPM Dr. [name] 11/24/15  . Essential hypertension   . Hyperlipidemia   . Presence of permanent cardiac pacemaker   . Type 2 diabetes mellitus ([place])    Past Surgical History:  Procedure Laterality Date  . COLONOSCOPY     3 SIMPLE ADENOMAS, AGE 41  . EP IMPLANTABLE DEVICE N/A 11/24/2015     . JOINT REPLACEMENT     knees bilat.  . TUBAL LIGATION     Allergies  Allergen Reactions  . Penicillins Hives and Itching       Current Outpatient Medications  Medication Sig    . apixaban (ELIQUIS) 5 MG TABS tablet Take 5 mg by mouth 2 (two) times daily.    Marland Kitchen atorvastatin (LIPITOR) 40 MG tablet Take 40 mg by mouth at bedtime.     . hydrALAZINE (APRESOLINE) 25 MG tablet Take 25 mg by mouth 2 (two) times daily.    Marland Kitchen LANTUS SOLOSTAR 100 UNIT/ML Solostar Pen Inject 30 Units into the skin at bedtime.     Marland Kitchen losartan (COZAAR) 100 MG tablet Take 100 mg by mouth every morning.     . metFORMIN (GLUCOPHAGE) 1000 MG tablet Take 1,000 mg by mouth 2 (two) times daily with a meal.    . NIFEdipine (PROCARDIA-XL/ADALAT CC) 30 MG 24 hr tablet Take 30 mg by mouth daily.    . ranitidine (ZANTAC) 300 MG tablet Take 300 mg by mouth every morning.      Family History  Problem Relation Age of Onset  . Heart failure Mother   . Hypertension Mother   . Heart failure  Sister   . Hypertension Father   . Colon cancer Neg Hx   . Colon polyps Neg Hx     Social History   Socioeconomic History  . Marital status: Married    Spouse name: Not on file  . Number of children: Not on file  . Years of education: Not on file  . Highest education level: Not on file  Occupational History  . Not on file  Social Needs  . Financial resource strain: Not on file  . Food insecurity:    Worry: Not on file    Inability: Not on file  . Transportation needs:    Medical: Not on file    Non-medical: Not on file  Tobacco Use  . Smoking status: Never Smoker  . Smokeless tobacco: Never Used  Substance and Sexual Activity  . Alcohol use: No    Alcohol/week: 0.0 oz  . Drug use: No  . Sexual activity: Yes    Birth control/protection: Post-menopausal  Lifestyle  . Physical activity:    Days per week: Not on file    Minutes per session: Not on file  . Stress: Not on file  Relationships  .  Social connections:    Talks on phone: Not on file    Gets together: Not on file    Attends religious service: Not on file    Active member of club or organization: Not on file    Attends meetings of clubs or organizations: Not on file    Relationship status: Not on file  Other Topics Concern  . Not on file  Social History Narrative   MARRIED FOR 72 YRS. 5 KIDS: #4 PRESENT TODAY(AGE 17)   Review of Systems PER HPI OTHERWISE ALL SYSTEMS ARE NEGATIVE.    Objective:   Physical Exam  Constitutional: She is oriented to person, place, and time. She appears well-developed and well-nourished. No distress.  HENT:  Head: Normocephalic and atraumatic.  Mouth/Throat: Oropharynx is clear and moist. No oropharyngeal exudate.  Eyes: Pupils are equal, round, and reactive to light. No scleral icterus.  Neck: Normal range of motion. Neck supple.  Cardiovascular: Normal rate, regular rhythm and normal heart sounds.  Pulmonary/Chest: Effort normal and breath sounds normal. No respiratory  distress.  Abdominal: Soft. Bowel sounds are normal. She exhibits no distension. There is no tenderness.  Musculoskeletal: She exhibits edema (BILATERAL 1+).  WALKS ASSISTED WITH A CANE.  Lymphadenopathy:    She has no cervical adenopathy.  Neurological: She is alert and oriented to person, place, and time.  NO  NEW FOCAL DEFICITS  Psychiatric: She has a normal mood and affect.  Vitals reviewed.     Assessment & Plan:

## 2018-04-26 NOTE — Patient Instructions (Addendum)
DRINK WATER TO KEEP YOUR URINE LIGHT YELLOW.  FOLLOW A HIGH FIBER DIET. AVOID ITEMS THAT CAUSE BLOATING & GAS.   COMPLETE COLONOSCOPY ON MAY 30. YOU MAY BRING THE ENEMA TO ADMINISTER IN THE PREOP AREA.      HOLD ELIQUIS MAY 28.      HALF LANTUS MAY 29.  CONTINUE GLUCOPHAGE.     ON MAY 29 START CLEAR LIQUID DIET. YOU MAY EAT MASHED POTATOES OR RICE IF YOU GET HUNGRY OR YOUR SUGAR DROPS.     ON MAY 30. YOU CAN HAVE A CLEAR LIQUID BREAKFAST OR GLUCERNA. ON MAY 30 NOTHING TO EAT AFTER 9 AM.  FOLLOW UP IN THE OFFICE WILL BE SCHEDULED IF NEEDED AFTER ENDOSCOPY.

## 2018-05-22 ENCOUNTER — Ambulatory Visit (INDEPENDENT_AMBULATORY_CARE_PROVIDER_SITE_OTHER): Payer: Medicare Other | Admitting: *Deleted

## 2018-05-22 DIAGNOSIS — I442 Atrioventricular block, complete: Secondary | ICD-10-CM

## 2018-05-22 NOTE — Progress Notes (Signed)
Remote pacemaker transmission.   

## 2018-05-24 ENCOUNTER — Ambulatory Visit (HOSPITAL_COMMUNITY)
Admission: RE | Admit: 2018-05-24 | Discharge: 2018-05-24 | Disposition: A | Discharge status: Home / Self Care | Payer: Medicare Other | Source: Ambulatory Visit | Attending: Gastroenterology | Admitting: Gastroenterology

## 2018-05-24 ENCOUNTER — Other Ambulatory Visit: Payer: Self-pay

## 2018-05-24 ENCOUNTER — Encounter (HOSPITAL_COMMUNITY)
Admission: RE | Disposition: A | Discharge status: Home / Self Care | Payer: Self-pay | Source: Ambulatory Visit | Attending: Gastroenterology

## 2018-05-24 ENCOUNTER — Encounter (HOSPITAL_COMMUNITY): Payer: Self-pay | Admitting: *Deleted

## 2018-05-24 DIAGNOSIS — R195 Other fecal abnormalities: Secondary | ICD-10-CM | POA: Insufficient documentation

## 2018-05-24 DIAGNOSIS — K644 Residual hemorrhoidal skin tags: Secondary | ICD-10-CM | POA: Insufficient documentation

## 2018-05-24 DIAGNOSIS — Z8249 Family history of ischemic heart disease and other diseases of the circulatory system: Secondary | ICD-10-CM | POA: Diagnosis not present

## 2018-05-24 DIAGNOSIS — Q438 Other specified congenital malformations of intestine: Secondary | ICD-10-CM | POA: Insufficient documentation

## 2018-05-24 DIAGNOSIS — Z86010 Personal history of colonic polyps: Secondary | ICD-10-CM | POA: Diagnosis not present

## 2018-05-24 DIAGNOSIS — E785 Hyperlipidemia, unspecified: Secondary | ICD-10-CM | POA: Diagnosis not present

## 2018-05-24 DIAGNOSIS — E119 Type 2 diabetes mellitus without complications: Secondary | ICD-10-CM | POA: Diagnosis not present

## 2018-05-24 DIAGNOSIS — Z7901 Long term (current) use of anticoagulants: Secondary | ICD-10-CM | POA: Insufficient documentation

## 2018-05-24 DIAGNOSIS — M1990 Unspecified osteoarthritis, unspecified site: Secondary | ICD-10-CM | POA: Insufficient documentation

## 2018-05-24 DIAGNOSIS — D122 Benign neoplasm of ascending colon: Secondary | ICD-10-CM | POA: Insufficient documentation

## 2018-05-24 DIAGNOSIS — K648 Other hemorrhoids: Secondary | ICD-10-CM | POA: Insufficient documentation

## 2018-05-24 DIAGNOSIS — K219 Gastro-esophageal reflux disease without esophagitis: Secondary | ICD-10-CM | POA: Diagnosis not present

## 2018-05-24 DIAGNOSIS — I10 Essential (primary) hypertension: Secondary | ICD-10-CM | POA: Diagnosis not present

## 2018-05-24 DIAGNOSIS — D123 Benign neoplasm of transverse colon: Secondary | ICD-10-CM | POA: Insufficient documentation

## 2018-05-24 DIAGNOSIS — Z79899 Other long term (current) drug therapy: Secondary | ICD-10-CM | POA: Diagnosis not present

## 2018-05-24 DIAGNOSIS — Z880 Allergy status to penicillin: Secondary | ICD-10-CM | POA: Insufficient documentation

## 2018-05-24 DIAGNOSIS — Z794 Long term (current) use of insulin: Secondary | ICD-10-CM | POA: Diagnosis not present

## 2018-05-24 DIAGNOSIS — Z950 Presence of cardiac pacemaker: Secondary | ICD-10-CM | POA: Insufficient documentation

## 2018-05-24 DIAGNOSIS — I442 Atrioventricular block, complete: Secondary | ICD-10-CM | POA: Insufficient documentation

## 2018-05-24 HISTORY — PX: POLYPECTOMY: SHX5525

## 2018-05-24 HISTORY — PX: COLONOSCOPY: SHX5424

## 2018-05-24 LAB — GLUCOSE, CAPILLARY: Glucose-Capillary: 171 mg/dL — ABNORMAL HIGH (ref 65–99)

## 2018-05-24 SURGERY — COLONOSCOPY
Anesthesia: Moderate Sedation

## 2018-05-24 MED ORDER — MEPERIDINE HCL 100 MG/ML IJ SOLN
INTRAMUSCULAR | Status: DC | PRN
  Administered 2018-05-24 (×2): 25 mg via INTRAVENOUS

## 2018-05-24 MED ORDER — MEPERIDINE HCL 100 MG/ML IJ SOLN
INTRAMUSCULAR | Status: AC
  Filled 2018-05-24: qty 2

## 2018-05-24 MED ORDER — MIDAZOLAM HCL 5 MG/5ML IJ SOLN
INTRAMUSCULAR | Status: AC
  Filled 2018-05-24: qty 10

## 2018-05-24 MED ORDER — SODIUM CHLORIDE 0.9 % IV SOLN
INTRAVENOUS | Status: DC
  Administered 2018-05-24: 13:00:00 via INTRAVENOUS

## 2018-05-24 MED ORDER — MIDAZOLAM HCL 5 MG/5ML IJ SOLN
INTRAMUSCULAR | Status: DC | PRN
  Administered 2018-05-24 (×2): 2 mg via INTRAVENOUS

## 2018-05-24 NOTE — Op Note (Addendum)
[facility] Patient Name: Natalie Castro Procedure Date: 05/24/2018 1:21 PM MRN: [id number] Date of Birth: November 15, 1941 Attending MD: Barney Drain MD, MD CSN: [id number] Age: 77 Admit Type: Outpatient Procedure:                Colonoscopy WITH COLD SNARE POLYPECTOMY Indications:              Personal history of colonic polyps: 2009 3 SIMPLE                            ADENOMAS REMOVED, Positive fecal immunochemical                            test: 2019 Providers:                Barney Drain MD, MD, Lurline Del, RN, Nelma Rothman,                            Technician Referring MD:             [name] MD, MD Medicines:                Meperidine 50 mg IV, Midazolam 4 mg IV Complications:            No immediate complications. Estimated Blood Loss:     Estimated blood loss was minimal. Procedure:                Pre-Anesthesia Assessment:                           - Prior to the procedure, a History and Physical                            was performed, and patient medications and                            allergies were reviewed. The patient's tolerance of                            previous anesthesia was also reviewed. The risks                            and benefits of the procedure and the sedation                            options and risks were discussed with the patient.                            All questions were answered, and informed consent                            was obtained. Prior Anticoagulants: The patient has                            taken Eliquis (apixaban), last dose was 3 days  prior to procedure. ASA Grade Assessment: II - A                            patient with mild systemic disease. After reviewing                            the risks and benefits, the patient was deemed in                            satisfactory condition to undergo the procedure.                            After obtaining informed consent, the colonoscope                             was passed under direct vision. Throughout the                            procedure, the patient's blood pressure, pulse, and                            oxygen saturations were monitored continuously. The                            EC-[id number] ([id number]) scope was introduced through                            the anus and advanced to the the cecum, identified                            by appendiceal orifice and ileocecal valve. The                            colonoscopy was somewhat difficult due to a                            tortuous colon. Successful completion of the                            procedure was aided by straightening and shortening                            the scope to obtain bowel loop reduction and                            COLOWRAP. The patient tolerated the procedure well.                            The quality of the bowel preparation was good. The                            ileocecal valve, appendiceal orifice, and rectum  were photographed. Scope In: 1:30:11 PM Scope Out: 1:51:40 PM Scope Withdrawal Time: 0 hours 19 minutes 13 seconds  Total Procedure Duration: 0 hours 21 minutes 29 seconds  Findings:      Four sessile polyps were found in the distal transverse colon, hepatic       flexure and ascending colon. The polyps were 2 to 4 mm in size. These       polyps were removed with a cold snare. Resection and retrieval were       complete.      The recto-sigmoid colon and sigmoid colon were mildly redundant.      External and internal hemorrhoids were found during retroflexion. The       hemorrhoids were small. Impression:               - Four 2 to 4 mm polyps in the distal transverse                            colon, at the hepatic flexure(2) and in the                            ascending colon, removed with a cold snare.                            Resected and retrieved.                           - Redundant  LEFT colon.                           - External and internal hemorrhoids. Moderate Sedation:      Moderate (conscious) sedation was administered by the endoscopy nurse       and supervised by the endoscopist. The following parameters were       monitored: oxygen saturation, heart rate, blood pressure, and response       to care. Total physician intraservice time was 31 minutes. Recommendation:           - Repeat colonoscopy is not recommended due to                            current age (23 years or older) for surveillance.                            NO NEED FOR ADDITIONAL SCREENING FOR COLON CANCER                            DUE TO AGE > 75.                           - High fiber diet. LOSE WEIGHT T O BMI < 30.                           - Continue present medications.                           - Await pathology results.                           -  Patient has a contact number available for                            emergencies. The signs and symptoms of potential                            delayed complications were discussed with the                            patient. Return to normal activities tomorrow.                            Written discharge instructions were provided to the                            patient. Procedure Code(s):        --- Professional ---                           [phone], Colonoscopy, flexible; with removal of                            tumor(s), polyp(s), or other lesion(s) by snare                            technique                           G0500, Moderate sedation services provided by the                            same physician or other qualified health care                            professional performing a gastrointestinal                            endoscopic service that sedation supports,                            requiring the presence of an independent trained                            observer to assist in the monitoring of the                             patient's level of consciousness and physiological                            status; initial 15 minutes of intra-service time;                            patient age 60 years or older (additional time 7  be reported with [phone], as appropriate)                           [phone], Moderate sedation services provided by the                            same physician or other qualified health care                            professional performing the diagnostic or                            therapeutic service that the sedation supports,                            requiring the presence of an independent trained                            observer to assist in the monitoring of the                            patient's level of consciousness and physiological                            status; each additional 15 minutes intraservice                            time (List separately in addition to code for                            primary service) Diagnosis Code(s):        --- Professional ---                           D12.3, Benign neoplasm of transverse colon (hepatic                            flexure or splenic flexure)                           D12.2, Benign neoplasm of ascending colon                           K64.8, Other hemorrhoids                           Z86.010, Personal history of colonic polyps                           R19.5, Other fecal abnormalities                           Q43.8, Other specified congenital malformations of                            intestine CPT copyright 2017 American  Medical Association. All rights reserved. The codes documented in this report are preliminary and upon coder review may  be revised to meet current compliance requirements. Barney Drain, MD Barney Drain MD, MD 05/24/2018 2:14:43 PM This report has been signed electronically. Number of Addenda: 0

## 2018-05-24 NOTE — Discharge Instructions (Signed)
You had 4 small polyps removed. You have moderate external hemorrhoids.   RE-START ELIQUIS MAY 31. CALLED PT TO DISCUSS AND SPOKE WITH DAUGHTER @1522 .  DRINK WATER TO KEEP YOUR URINE LIGHT YELLOW.  CONTINUE YOUR WEIGHT LOSS EFFORTS.  WHILE I DO NOT WANT TO ALARM YOU, YOUR BODY MASS INDEX IS OVER 30 WHICH MEANS YOU ARE OBESE. OBESITY IS ASSOCIATED WITH AN INCREASED RISK FOR CIRRHOSIS AND ALL CANCERS, INCLUDING ESOPHAGEAL AND COLON CANCER. A WEIGHT OF 179 LBS OR LESS  WILL GET YOUR BODY MASS INDEX(BMI) UNDER 30.  FOLLOW A HIGH FIBER DIET. AVOID ITEMS THAT CAUSE BLOATING & GAS. SEE INFO BELOW.  YOUR BIOPSY RESULTS WILL BE AVAILABLE IN 7 days.      Colonoscopy Care After Read the instructions outlined below and refer to this sheet in the next week. These discharge instructions provide you with general information on caring for yourself after you leave the hospital. While your treatment has been planned according to the most current medical practices available, unavoidable complications occasionally occur. If you have any problems or questions after discharge, call DR. Ahaana Rochette, [phone].  ACTIVITY  You may resume your regular activity, but move at a slower pace for the next 24 hours.   Take frequent rest periods for the next 24 hours.   Walking will help get rid of the air and reduce the bloated feeling in your belly (abdomen).   No driving for 24 hours (because of the medicine (anesthesia) used during the test).   You may shower.   Do not sign any important legal documents or operate any machinery for 24 hours (because of the anesthesia used during the test).    NUTRITION  Drink plenty of fluids.   You may resume your normal diet as instructed by your doctor.   Begin with a light meal and progress to your normal diet. Heavy or fried foods are harder to digest and may make you feel sick to your stomach (nauseated).   Avoid alcoholic beverages for 24 hours or as instructed.      MEDICATIONS  You may resume your normal medications.   WHAT YOU CAN EXPECT TODAY  Some feelings of bloating in the abdomen.   Passage of more gas than usual.   Spotting of blood in your stool or on the toilet paper  .  IF YOU HAD POLYPS REMOVED DURING THE COLONOSCOPY:  Eat a soft diet IF YOU HAVE NAUSEA, BLOATING, ABDOMINAL PAIN, OR VOMITING.    FINDING OUT THE RESULTS OF YOUR TEST Not all test results are available during your visit. [name] WILL CALL YOU WITHIN 14 DAYS OF YOUR PROCEDUE WITH YOUR RESULTS. Do not assume everything is normal if you have not heard from DR. Zoe Creasman, CALL HER OFFICE AT [phone].  SEEK IMMEDIATE MEDICAL ATTENTION AND CALL THE OFFICE: [phone] IF:  You have more than a spotting of blood in your stool.   Your belly is swollen (abdominal distention).   You are nauseated or vomiting.   You have a temperature over 101F.   You have abdominal pain or discomfort that is severe or gets worse throughout the day.   High-Fiber Diet A high-fiber diet changes your normal diet to include more whole grains, legumes, fruits, and vegetables. Changes in the diet involve replacing refined carbohydrates with unrefined foods. The calorie level of the diet is essentially unchanged. The Dietary Reference Intake (recommended amount) for adult males is 38 grams per day. For adult females, it is 25 grams  per day. Pregnant and lactating women should consume 28 grams of fiber per day. Fiber is the intact part of a plant that is not broken down during digestion. Functional fiber is fiber that has been isolated from the plant to provide a beneficial effect in the body.  PURPOSE  Increase stool bulk.   Ease and regulate bowel movements.   Lower cholesterol.   REDUCE RISK OF COLON CANCER  INDICATIONS THAT YOU NEED MORE FIBER  Constipation and hemorrhoids.   Uncomplicated diverticulosis (intestine condition) and irritable bowel syndrome.   Weight  management.   As a protective measure against hardening of the arteries (atherosclerosis), diabetes, and cancer.   GUIDELINES FOR INCREASING FIBER IN THE DIET  Start adding fiber to the diet slowly. A gradual increase of about 5 more grams (2 slices of whole-wheat bread, 2 servings of most fruits or vegetables, or 1 bowl of high-fiber cereal) per day is best. Too rapid an increase in fiber may result in constipation, flatulence, and bloating.   Drink enough water and fluids to keep your urine clear or pale yellow. Water, juice, or caffeine-free drinks are recommended. Not drinking enough fluid may cause constipation.   Eat a variety of high-fiber foods rather than one type of fiber.   Try to increase your intake of fiber through using high-fiber foods rather than fiber pills or supplements that contain small amounts of fiber.   The goal is to change the types of food eaten. Do not supplement your present diet with high-fiber foods, but replace foods in your present diet.   INCLUDE A VARIETY OF FIBER SOURCES  Replace refined and processed grains with whole grains, canned fruits with fresh fruits, and incorporate other fiber sources. White rice, white breads, and most bakery goods contain little or no fiber.   Brown whole-grain rice, buckwheat oats, and many fruits and vegetables are all good sources of fiber. These include: broccoli, Brussels sprouts, cabbage, cauliflower, beets, sweet potatoes, white potatoes (skin on), carrots, tomatoes, eggplant, squash, berries, fresh fruits, and dried fruits.   Cereals appear to be the richest source of fiber. Cereal fiber is found in whole grains and bran. Bran is the fiber-rich outer coat of cereal grain, which is largely removed in refining. In whole-grain cereals, the bran remains. In breakfast cereals, the largest amount of fiber is found in those with "bran" in their names. The fiber content is sometimes indicated on the label.   You may need to  include additional fruits and vegetables each day.   In baking, for 1 cup white flour, you may use the following substitutions:   1 cup whole-wheat flour minus 2 tablespoons.   1/2 cup white flour plus 1/2 cup whole-wheat flour.   Polyps, Colon  A polyp is extra tissue that grows inside your body. Colon polyps grow in the large intestine. The large intestine, also called the colon, is part of your digestive system. It is a long, hollow tube at the end of your digestive tract where your body makes and stores stool. Most polyps are not dangerous. They are benign. This means they are not cancerous. But over time, some types of polyps can turn into cancer. Polyps that are smaller than a pea are usually not harmful. But larger polyps could someday become or may already be cancerous. To be safe, doctors remove all polyps and test them.   WHO GETS POLYPS? Anyone can get polyps, but certain people are more likely than others. You may have a  greater chance of getting polyps if:  You are over 50.   You have had polyps before.   Someone in your family has had polyps.   Someone in your family has had cancer of the large intestine.   Find out if someone in your family has had polyps. You may also be more likely to get polyps if you:   Eat a lot of fatty foods   Smoke   Drink alcohol   Do not exercise  Eat too much   PREVENTION There is not one sure way to prevent polyps. You might be able to lower your risk of getting them if you:  Eat more fruits and vegetables and less fatty food.   Do not smoke.   Avoid alcohol.   Exercise every day.   Lose weight if you are overweight.   Eating more calcium and folate can also lower your risk of getting polyps. Some foods that are rich in calcium are milk, cheese, and broccoli. Some foods that are rich in folate are chickpeas, kidney beans, and spinach.   Hemorrhoids Hemorrhoids are dilated (enlarged) veins around the rectum. Sometimes clots  will form in the veins. This makes them swollen and painful. These are called thrombosed hemorrhoids. Causes of hemorrhoids include:  Constipation.   Straining to have a bowel movement.   HEAVY LIFTING  HOME CARE INSTRUCTIONS  Eat a well balanced diet and drink 6 to 8 glasses of water every day to avoid constipation. You may also use a bulk laxative.   Avoid straining to have bowel movements.   Keep anal area dry and clean.   Do not use a donut shaped pillow or sit on the toilet for long periods. This increases blood pooling and pain.   Move your bowels when your body has the urge; this will require less straining and will decrease pain and pressure.

## 2018-05-24 NOTE — H&P (Signed)
. Primary Care Physician:  [name], MD Primary Gastroenterologist:  [name]  Pre-Procedure History & Physical: HPI:  Natalie Castro is a 77 y.o. female here for  PERSONAL HISTORY OF POLYPS.  Past Medical History:  Diagnosis Date  . Acid reflux   . Arthritis   . Colon adenomas    AGE 72  . Complete heart block (HCC)    STJ PPM Dr. [name] 11/24/15  . Essential hypertension   . Hyperlipidemia   . Presence of permanent cardiac pacemaker   . Type 2 diabetes mellitus ([facility])     Past Surgical History:  Procedure Laterality Date  . COLONOSCOPY     3 SIMPLE ADENOMAS, AGE 24  . EP IMPLANTABLE DEVICE N/A 11/24/2015   Procedure: Pacemaker Implant;  Surgeon: [name], MD;  Location: [facility];  Service: Cardiovascular;  Laterality: N/A;  . JOINT REPLACEMENT     knees bilat.  . TUBAL LIGATION      Prior to Admission medications   Medication Sig Start Date End Date Taking? Authorizing Provider  apixaban (ELIQUIS) 5 MG TABS tablet Take 5 mg by mouth 2 (two) times daily.   Yes [provider]  atorvastatin (LIPITOR) 40 MG tablet Take 40 mg by mouth at bedtime.  11/02/15  Yes [provider]  hydrALAZINE (APRESOLINE) 25 MG tablet Take 25 mg by mouth 2 (two) times daily. 11/02/15  Yes [provider]  LANTUS SOLOSTAR 100 UNIT/ML Solostar Pen Inject 30 Units into the skin at bedtime.  11/16/15  Yes [provider]  losartan (COZAAR) 100 MG tablet Take 100 mg by mouth every morning.  11/02/15  Yes [provider]  metFORMIN (GLUCOPHAGE) 1000 MG tablet Take 1,000 mg by mouth 2 (two) times daily with a meal.   Yes [provider]  NIFEdipine (PROCARDIA-XL/ADALAT CC) 30 MG 24 hr tablet Take 30 mg by mouth daily.   Yes [provider]  ranitidine (ZANTAC) 300 MG tablet Take 300 mg by mouth every morning.  11/02/15  Yes [provider]    Allergies as of 04/26/2018 - Review Complete 04/26/2018  Allergen  Reaction Noted  . Penicillins Hives and Itching     Family History  Problem Relation Age of Onset  . Heart failure Mother   . Hypertension Mother   . Heart failure Sister   . Hypertension Father   . Cancer Sister   . Dementia Sister   . Colon cancer Neg Hx   . Colon polyps Neg Hx     Social History   Socioeconomic History  . Marital status: Married    Spouse name: Not on file  . Number of children: Not on file  . Years of education: Not on file  . Highest education level: Not on file  Occupational History  . Not on file  Social Needs  . Financial resource strain: Not on file  . Food insecurity:    Worry: Not on file    Inability: Not on file  . Transportation needs:    Medical: Not on file    Non-medical: Not on file  Tobacco Use  . Smoking status: Never Smoker  . Smokeless tobacco: Never Used  Substance and Sexual Activity  . Alcohol use: No    Alcohol/week: 0.0 oz  . Drug use: No  . Sexual activity: Yes    Birth control/protection: Post-menopausal  Lifestyle  . Physical activity:    Days per week: Not on file  Minutes per session: Not on file  . Stress: Not on file  Relationships  . Social connections:    Talks on phone: Not on file    Gets together: Not on file    Attends religious service: Not on file    Active member of club or organization: Not on file    Attends meetings of clubs or organizations: Not on file    Relationship status: Not on file  . Intimate partner violence:    Fear of current or ex partner: Not on file    Emotionally abused: Not on file    Physically abused: Not on file    Forced sexual activity: Not on file  Other Topics Concern  . Not on file  Social History Narrative   MARRIED FOR 39 YRS. 5 KIDS: #4 PRESENT TODAY(AGE 12)    Review of Systems: See HPI, otherwise negative ROS   Physical Exam: BP (!) 157/78   Pulse 79   Temp 98.4 F (36.9 C) (Oral)   Resp 12   Ht 5\' 5"  (1.651 m)   Wt 215 lb (97.5 kg)   SpO2 98%    BMI 35.78 kg/m  General:   Alert,  pleasant and cooperative in NAD Head:  Normocephalic and atraumatic. Neck:  Supple; Lungs:  Clear throughout to auscultation.    Heart:  Regular rate and rhythm. Abdomen:  Soft, nontender and nondistended. Normal bowel sounds, without guarding, and without rebound.   Neurologic:  Alert and  oriented x4;  grossly normal neurologically.  Impression/Plan:     PERSONAL HISTORY OF POLYPS.  PLAN: 1. TCS TODAY. DISCUSSED PROCEDURE, BENEFITS, & RISKS: < 1% chance of medication reaction, bleeding, perforation, or rupture of spleen/liver.

## 2018-05-25 ENCOUNTER — Encounter: Payer: Self-pay | Admitting: Cardiology

## 2018-05-29 ENCOUNTER — Encounter (HOSPITAL_COMMUNITY): Payer: Self-pay | Admitting: Gastroenterology

## 2018-05-29 DIAGNOSIS — E1140 Type 2 diabetes mellitus with diabetic neuropathy, unspecified: Secondary | ICD-10-CM | POA: Diagnosis not present

## 2018-05-29 DIAGNOSIS — I2782 Chronic pulmonary embolism: Secondary | ICD-10-CM | POA: Diagnosis not present

## 2018-05-29 DIAGNOSIS — I10 Essential (primary) hypertension: Secondary | ICD-10-CM | POA: Diagnosis not present

## 2018-05-31 ENCOUNTER — Other Ambulatory Visit: Payer: Self-pay | Admitting: *Deleted

## 2018-05-31 NOTE — Patient Outreach (Addendum)
[facility]) Care Management  05/31/2018  Natalie Castro 01/31/1941 [id number]   Telephone Screen  Referral Date: 05/30/18  Referral Source: MD referral, Dr Natalie Castro Referral Reason: Patient need assistance with medication cost Insurance: [employer] attempt #1 Patient is able to verify HIPAA Reviewed and addressed referral to [facility] with patient She reports running out of Lantus prior to having a recent colonoscopy and not restarting it until 05/29/18 after seen by her primary care  MD, Natalie Castro.  She reports being on a fixed income and her lantus and eliquis generally are about $36 but in April she paid $100 for Eliquis. It later was $45 in May 2019. On 05/29/18 paid $40 for Lantus. She doe not know if she is in the donut hole  Social: Presently she and her husband are staying with her daughter, Natalie Castro Loss adjuster, chartered) in [place].  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 Medications are delivered in blister packaging by Rx care   Appointments: seen by Dr Natalie Castro on 05/29/18 and has a f/u with Dr Natalie Castro in September 2019, Pacemaker scheduled to be checked in August 2019  Advance Directives: She has been offered forms by another [facility] SW but has not completed forms and can not recall where forms are. Requests forms to be sent to her daughter's home   Consent: [facility] RN CM reviewed [facility] services with patient. Patient gave verbal consent for services.  Plan:  Consumer assessed no further interventions needed for telephonic [facility] RN CM [facility] RN CM will refer Natalie Castro to [facility] and send copy of advance directives and EMMI education on Diabetes diet, diabetes exchange diet and weight loss diet to her in the  mail  Renick. [name], RN, BSN, CCM [facility] Telephonic Care Management Care Coordinator Direct number [phone]  Main [facility] number [phone] Fax number [phone]

## 2018-06-01 ENCOUNTER — Other Ambulatory Visit: Payer: Self-pay | Admitting: Pharmacist

## 2018-06-01 NOTE — Patient Outreach (Signed)
[facility]) Care Management  06/01/2018  Natalie Castro [birth year] [id number]   77 y.o. year old female referred to [facility] for Medication Assistance (Lantus, Eliquis) Referred by Joellyn Quails, [facility] Telephonic RN.  Patient has previously worked with [facility], [place]) for medication assistance.  Patient with [employer].   Patient confirms identity with HIPAA-identifiers x2 and gives verbal consent to speak over the phone about medications.   Plan: Scheduled home visit with patient for next week at patient's request. Patient is staying with her daughter in [place].   Ruben Reason, PharmD Clinical Pharmacist, [facility] [phone]

## 2018-06-05 ENCOUNTER — Other Ambulatory Visit: Payer: Self-pay | Admitting: Pharmacist

## 2018-06-05 NOTE — Patient Outreach (Signed)
Natalie Castro) Care Management  [place]   06/05/2018  Natalie Castro Natalie Castro, Natalie Castro Natalie Castro  Subjective: 77 y.o. year old female referred to [facility] for Medication Management (Initial medication review) and Medication Assistance (Eliquis, Lantus) Referred by Joellyn Quails, [facility] Telephonic RN.  Patient has previously worked with [place] Lowella Grip, [place]) for medication assistance.   PMH s/f: hypertension, complete heart block, history of DVT and saddle pulmonary embolus, type 2 diabetes (controlled), arthritis, positive FIT  Patient with [employer] plan.   Patient confirms identity with HIPAA-identifiers x2 and gives verbal consent to speak over the phone about medications.    Medication Adherence: Based on fill history, patient is adherent to medications. Patient additionally has very good knowledge of her medications and feels confident in what each medication is for and when to take each medication.    Medication Assistance:  Patient states that her Eliquis recently cost about $200, but then went back down to $45. She is not sure what happened exactly or if she is in the Medicare Gap. Patient states her most recent Lantus prescription cost $90. Her other prescriptions cost around $30 combined. She is able to get her testing strips for free, but has to pay for pen needles.    Medication Management:  [facility] packages pills for her (AM, 6PM, and 8PM packs). Patient manages her medication well on her own.    Objective:   Encounter Medications: Outpatient Encounter Medications as of 06/05/2018  Medication Sig  . apixaban (ELIQUIS) 5 MG TABS tablet Take 5 mg by mouth 2 (two) times daily.  Marland Kitchen atorvastatin (LIPITOR) 40 MG tablet Take 40 mg by mouth at bedtime.   . hydrALAZINE (APRESOLINE) 25 MG tablet Take 25 mg by mouth 2 (two) times daily.  Marland Kitchen LANTUS SOLOSTAR 100 UNIT/ML Solostar Pen Inject 30 Units into the skin at  bedtime.   Marland Kitchen losartan (COZAAR) 100 MG tablet Take 100 mg by mouth every morning.   . metFORMIN (GLUCOPHAGE) 1000 MG tablet Take 1,000 mg by mouth 2 (two) times daily with a meal.  . NIFEdipine (PROCARDIA-XL/ADALAT CC) 30 MG 24 hr tablet Take 30 mg by mouth daily.  . ranitidine (ZANTAC) 300 MG tablet Take 300 mg by mouth every morning.    No facility-administered encounter medications on file as of 06/05/2018.     Functional Status: In your present state of health, do you have any difficulty performing the following activities: 06/16/2017  Hearing? N  Vision? Y  Difficulty concentrating or making decisions? N  Walking or climbing stairs? Y  Dressing or bathing? N  Doing errands, shopping? Y  Preparing Food and eating ? Y  Comment standing to prepare food causes increase pain in legs  Using the Toilet? N  In the past six months, have you accidently leaked urine? N  Do you have problems with loss of bowel control? N  Managing your Medications? N  Managing your Finances? Y  Housekeeping or managing your Housekeeping? Y  Some recent data might be hidden    Fall/Depression Screening: Fall Risk  11/03/2017 07/28/2017 06/16/2017  Falls in the past year? No No No  Risk for fall due to : Impaired mobility;Impaired vision - Impaired mobility;Impaired vision   PHQ 2/9 Scores 05/31/2018 11/03/2017 07/28/2017 06/16/2017 06/02/2017 05/19/2017  PHQ - 2 Score 0 0 0 0 0 0   Assessment:  Drugs sorted by system:  Cardiovascular: apixaban, atorvastatin, hydralazine, losartan, nifedipine  Gastrointestinal: ranitidine  Endocrine:  metformin, Lantus  Drugs sorted by problem list:  HTN: hydralazine, losartan, nifedipine  History of PE/DVT: apixaban  DM: Lantus, metformin, atorvastatin  No indication: ranitidine  Gaps in therapy: ASA Drug interactions: none noted Other issues noted: unclear if apixaban therapy needed to extend greater than Castro months   Plan: # Call Dr. Lovena Le (cardiology) to  double check patient needs to remain on apixaban. Original notes indicate that therapy was to last for one year (10/2016-11/ 2018). If therapy is to be continued, would it be appropriate to reduce dose to 2.5mg  BID?    #Obtain patient's out of pocket prescription expenditure from [employer]. If patient has reached minimum prescription spend threshold per MAPs (below), patient is considering switch to [facility] order pharmacy) as Tier 1 and 2 generics would have a $0 copay. Patient understands that [facility] would not be able to package medications in pill packs. Patient states she has no difficulty opening prescription bottles.  #MAPs:  Eliquis (BMS): below income limit (300% of FPL) and spent 3% of household income on prescriptions  Lantus (Sanofi): spent 5% of household income on prescriptions  #Provided counseling to patient on Medicare part D plan, United Healthcare's OTC benefit catolog (she has plan 2 and has $60 per quarter to spend via Natalie Sumter)  Follow up in 3-4 business days.   Ruben Reason, PharmD Clinical Pharmacist, [facility] [phone]

## 2018-06-05 NOTE — Progress Notes (Signed)
LMOM to call.

## 2018-06-06 NOTE — Progress Notes (Signed)
Letter mailed to pt ( ATTN Natalie Castro, daughter) for a call about results and plan of care.

## 2018-06-08 ENCOUNTER — Other Ambulatory Visit: Payer: Self-pay | Admitting: Pharmacist

## 2018-06-08 NOTE — Patient Outreach (Signed)
[facility]) Care Management  06/08/2018  Natalie Castro 1941/11/26 [id number]    77 y.o. year old female referred to [place] for Medication Assistance (Eliquis, Lantus) Referred by Joellyn Quails, [facility] Telephonic RN.  Patient has previously worked with [place] Lowella Grip, [place]) for medication assistance.   Patient with [employer].   Patient confirms identity with HIPAA-identifiers x2 and gives verbal consent to speak over the phone about medications.    Subjective:   Medication Assistance:  Called patient's preferred pharmacy, [facility] to ask for patient's out of pocket prescription drug expenditure report. Spoke with technician who stated unfortunately patient had to sign a release for this information.   Called Dr. Tanna Furry office to double check if patient needs to remain on apixaban or if dose should be reduced to 2.5mg  BID per guidelines. Unfortunately, office was closed for an all day staff meeting.   Plan:  Spoke with patient to update her on today's (lack of) findings. Patient states she will look for her EOB paperwork this weekend, and if she can't find it she will go to the pharmacy to sign the release. She has not ordered her OTC products from the [facility] catalog yet but plans to do so before the end of hte month. Patient is still in agreement with our plan to reduce her prescription costs. Will follow up with Dr. Tanna Furry office and patient in 2-1 business days.   Ruben Reason, PharmD Clinical Pharmacist, [place] [facility] [phone]

## 2018-06-11 ENCOUNTER — Other Ambulatory Visit: Payer: Self-pay | Admitting: Pharmacist

## 2018-06-11 ENCOUNTER — Telehealth: Payer: Self-pay | Admitting: Internal Medicine

## 2018-06-11 NOTE — Patient Outreach (Signed)
[facility]) Care Management  06/11/2018  Natalie Castro Feb 04, 1941 [id number]  77 y.o. year old female referred to [place] for Medication Assistance (Eliquis, Lantus) Referred byKim [name] [facility] Telephonic RN.  Patient has previously worked with [place] Lowella Grip, [place]) for medication assistance.  Patient with [employer].   Patient confirms identity with HIPAA-identifiers x2 and gives verbal consent to speak over the phone about medications.    Subjective:   Medication Assistance:  Called Dr. Tanna Furry office to double check if patient needs to remain on apixaban or if dose should be reduced to 2.5mg  BID. Message has been routed to Dr. Lovena Le for his review. Awaiting call back.   Patient states that she found her EOB over the weekend from May. She doesn't have the paper right now and needs to call me back in the morning.   Plan:  Will await call back from patient and Dr. Lovena Le before proceeding. Will follow up in 3-5 business days if I have not received call backs by that time.   Ruben Reason, PharmD Clinical Pharmacist, [place] [facility] [phone]

## 2018-06-11 NOTE — Telephone Encounter (Signed)
Natalie Castro pharmacist with [facility] calling to let us know she is helping pt with assistance with her Eliquis, she would like to know if pt will continue to be on Eliquis and per guidelines if you would like to decrease dosage to 2.5 mg BID? Pls call [phone]

## 2018-06-12 LAB — CUP PACEART REMOTE DEVICE CHECK
Battery Remaining Longevity: 127 mo
Battery Remaining Percentage: 95.5 %
Brady Statistic AS VP Percent: 3.9 %
Brady Statistic RA Percent Paced: 1 %
Brady Statistic RV Percent Paced: 3.9 %
Date Time Interrogation Session: 20190528060723
Implantable Lead Implant Date: 20161129
Implantable Lead Location: 753859
Implantable Lead Location: 753860
Implantable Lead Model: 5076
Lead Channel Impedance Value: 360 Ohm
Lead Channel Pacing Threshold Pulse Width: 0.4 ms
Lead Channel Sensing Intrinsic Amplitude: 4.6 mV
Lead Channel Sensing Intrinsic Amplitude: 8.8 mV
Lead Channel Setting Pacing Amplitude: 1.75 V
Lead Channel Setting Pacing Pulse Width: 0.4 ms
Lead Channel Setting Sensing Sensitivity: 2 mV
MDC IDC LEAD IMPLANT DT: 20161129
MDC IDC MSMT BATTERY VOLTAGE: 3.01 V
MDC IDC MSMT LEADCHNL RA PACING THRESHOLD AMPLITUDE: 0.75 V
MDC IDC MSMT LEADCHNL RV IMPEDANCE VALUE: 490 Ohm
MDC IDC MSMT LEADCHNL RV PACING THRESHOLD AMPLITUDE: 0.75 V
MDC IDC MSMT LEADCHNL RV PACING THRESHOLD PULSEWIDTH: 0.4 ms
MDC IDC PG IMPLANT DT: 20161129
MDC IDC SET LEADCHNL RV PACING AMPLITUDE: 2 V
MDC IDC STAT BRADY AP VP PERCENT: 1 %
MDC IDC STAT BRADY AP VS PERCENT: 1 %
MDC IDC STAT BRADY AS VS PERCENT: 95 %
Pulse Gen Model: 2240
Pulse Gen Serial Number: 7826037

## 2018-06-15 ENCOUNTER — Other Ambulatory Visit: Payer: Self-pay | Admitting: Pharmacist

## 2018-06-15 NOTE — Patient Outreach (Signed)
[place] [name]) Care Management  06/15/2018  Natalie Castro 06-27-1941 [id number]  Incoming call from Claiborne Billings, [place] with Dr. Lovena Le at [facility]. Claiborne Billings states that Dr. Lovena Le is not the prescriber for Eliquis. We both found that in the chart the medication is listed as "historical provider", so it is unclear who has been providing refills to the patient.   Called patient's preferred pharmacy to check prescriber on Eliquis prescription. Spoke with pharmacist who says that the prescriber is Dr. Legrand Rams.   Called Dr. Josephine Cables office. Unfortunately had to leave a message stating reason for call: Original note from when patient started Eliquis during hospitalization states a duration of 1 year. It has been 1.5 years at this point and I would like to double check if Dr. Legrand Rams wants to continue the Eliquis before proceeding with medication assistance application. If Dr. Legrand Rams would like to continue the Eliquis, should the dose be reduced to 2.5mg  BID per guideline recommendations? Left my callback information.   Plan:  Waiting for follow up from Dr. Legrand Rams. Will double check in 2-4 business days. Will reach out to patient in this time to check EOB.   Ruben Reason, PharmD Clinical Pharmacist, [place] [facility] [phone]

## 2018-06-15 NOTE — Telephone Encounter (Signed)
Returned call to [facility] pharmacist.  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  Dr. Lovena Le did not order Eliquis, and has never refilled it. Unsure who is managing Pt PE and Eliquis.    Per review of Pt medication history, last order of Eliquis was 02/23/2017 when Pt was in hospital by Dr. Tyrell Antonio.  Pt PCP is Dr. Legrand Rams.    Left this nurse call back if Almyra Free has any other questions.

## 2018-06-19 ENCOUNTER — Other Ambulatory Visit: Payer: Self-pay | Admitting: Pharmacist

## 2018-06-19 NOTE — Patient Outreach (Signed)
[facility]) Care Management  06/19/2018  Natalie Castro 04-Apr-1941 [id number]   77 y.o. year old female referred to [place] for Care Coordination and Medication Assistance (Eliquis)  Spoke with Tonia Ghent, RN with Dr. Legrand Rams this morning to seek clarification on whether patient needs to remain on Eliquis (nearly 19 months of treatment following PE in November 2017) or have reduced 2.5mg  BID dose.   Tonia Ghent states that Dr. Legrand Rams has been refilling patient's Eliquis prescription but is not managing the Eliquis. Additionally, Tonia Ghent states that Dr. Legrand Rams will be out of the country until July 09 2018.   If I need to assist patient in applying to medication assistance, I will need clarification prior to July 15, as MAP process has a long turn around time.  Plan:  1. Seek Dr. Tanna Furry professional opinion in this matter. Will send direct inbasket message.   Follow up: 1-3 business days.   Ruben Reason, PharmD Clinical Pharmacist, [place] [facility] [phone]

## 2018-06-20 NOTE — Patient Outreach (Signed)
[facility]) Care Management  06/20/2018  Natalie Castro 1941-01-21 [id number]  After seeking guidance from Karrie Meres, [place] manager, will not contact Dr. Lovena Le but wait for prescriber (Dr. Legrand Rams). Per Tonia Ghent, RN, Dr. Legrand Rams will return to the country on July 15.   Ruben Reason, PharmD Clinical Pharmacist, [place] [phone]

## 2018-06-26 ENCOUNTER — Other Ambulatory Visit: Payer: Self-pay | Admitting: Pharmacist

## 2018-06-26 NOTE — Patient Outreach (Signed)
[facility]) Care Management  06/26/2018  HEELA HEISHMAN 05-11-1941 [id number]   Follow up with patient regarding EOB from [facility]. (Attempt # 3). Patient confirmed HIPAA identifers x 2.   Patient states she does not have her EOB. I counseled her that she will probably receive a new report in the mail in the next couple of days as it is the first week of the month. We need the EOB to proceed with the Eliquis application and the possible transfer of prescriptions to [facility] mail order pharmacy.   Patient verbalizes understanding. She states she will call me at my direct contact number once she recieves the EOB in the mail. Provided patient with my contact information. Will follow up within 1 week.   Ruben Reason, PharmD Clinical Pharmacist, [place] [facility] [phone]

## 2018-07-06 ENCOUNTER — Ambulatory Visit: Payer: Self-pay | Admitting: Pharmacist

## 2018-07-06 ENCOUNTER — Other Ambulatory Visit: Payer: Self-pay | Admitting: Pharmacist

## 2018-07-06 NOTE — Patient Outreach (Signed)
[facility]) Care Management  07/06/2018  Natalie Castro 03-06-1941 [id number]  77 y.o. year old female referred to [place] for Case Closure   Was unable to reach patient via telephone today. Unfortunately, phone number has been disconnected. (unsuccessful outreach #4).  Plan: Will close pharmacy episode due to inability to maintain contact with the patient. Will remove myself from the care team. I am happy to reopen the pharmacy episode if patient has clinical pharmacy needs in the future and is an active participant in her care.   Ruben Reason, PharmD Clinical Pharmacist, [place] [facility] [phone]

## 2018-07-16 DIAGNOSIS — H524 Presbyopia: Secondary | ICD-10-CM | POA: Diagnosis not present

## 2018-07-16 DIAGNOSIS — H52223 Regular astigmatism, bilateral: Secondary | ICD-10-CM | POA: Diagnosis not present

## 2018-07-16 DIAGNOSIS — H5203 Hypermetropia, bilateral: Secondary | ICD-10-CM | POA: Diagnosis not present

## 2018-07-16 DIAGNOSIS — H25813 Combined forms of age-related cataract, bilateral: Secondary | ICD-10-CM | POA: Diagnosis not present

## 2018-07-23 ENCOUNTER — Telehealth: Payer: Self-pay | Admitting: Gastroenterology

## 2018-07-23 NOTE — Telephone Encounter (Signed)
Pt's daughter, [name] called and is aware of results.

## 2018-07-23 NOTE — Telephone Encounter (Signed)
[phone] patient returned call, please call back

## 2018-07-23 NOTE — Telephone Encounter (Signed)
Pt is aware of her results from her procedure and will have her daughter Natalie Castro call also.

## 2018-07-23 NOTE — Telephone Encounter (Signed)
Natalie Castro, SHE IS RETURNING CALL FROM THE LETTER SHE RECEIVED LAST MONTH

## 2018-07-23 NOTE — Telephone Encounter (Signed)
Tried to call twice and the number is not working.

## 2018-07-23 NOTE — Telephone Encounter (Signed)
See separate note.

## 2018-08-21 ENCOUNTER — Ambulatory Visit (INDEPENDENT_AMBULATORY_CARE_PROVIDER_SITE_OTHER): Payer: Medicare Other | Admitting: *Deleted

## 2018-08-21 DIAGNOSIS — I442 Atrioventricular block, complete: Secondary | ICD-10-CM

## 2018-08-21 NOTE — Progress Notes (Signed)
Remote pacemaker transmission.   

## 2018-08-23 ENCOUNTER — Encounter: Payer: Self-pay | Admitting: Cardiology

## 2018-09-10 ENCOUNTER — Other Ambulatory Visit: Payer: Self-pay | Admitting: *Deleted

## 2018-09-10 ENCOUNTER — Other Ambulatory Visit: Payer: Self-pay | Admitting: Pharmacist

## 2018-09-10 NOTE — Patient Outreach (Signed)
[place] [facility]) Care Management  09/10/2018  KEANDRA MEDERO 13-Apr-1941 [id number]   Care coordination  [facility] RN CM received a message left by Mrs Tilmon from 09/07/18  CM returned her call 09/10/18  She requests [facility] pharmacy contact information to assist with changing from one to three month supply of medication delivery for cost concerns CM referred her to her optum[facility] number on her insurance card as [phone] and if further issues to [place] pharmacist and provided julie contact number Mrs Warrior also voiced concern about Mr Lechuga returning to the hospital for a UTI that she feels could have been resolved prior to admission (calls to providers prior to admission) CM encouraged to speak with primary MD and reminded her of access to urgent care services CM provided her a list of contacts for neurologists and neurologist listed as in network per united health care web site in [place] She reports since Mr [name] D/c she and he are now back in [place] at her Daughter, Myra's home  Mrs Pope voiced appreciation of services rendered  [place] L. [name], RN, BSN, [place] Coordinator Office number [phone] Mobile number [phone]  Main [facility] number [phone] Fax number [phone]

## 2018-09-10 NOTE — Patient Outreach (Signed)
[facility] [facility]) Care Management  09/10/2018  Natalie Castro 1941/09/01 [id number]  77 year old female referred to [place] for medication management.  Patient well known to [place] as she has had frequent follow-up for medication assistance since May 2018 for Eliquis and Lantus, both approved via PAPs last year.    PMHx includes hypertension, complete heart block, history of DVT and saddle pulmonary embolus, type 2 diabetes (controlled), arthritis, positive FIT.   Patient with [employer].   Per notes, patient currently staying with daughter in [place], [place].  Previously receiving pill packaging through [facility].    ---------------------------------------------------------------  Unsuccessful call placed to [name] today on home, mobile, and temporary numbers listed in [facility].  No voicemail able to be left on any of these lines.   Plan: I will send patient an unsuccessful outreach letter and follow-up again with her within 3-4 business days.   Ralene Bathe, PharmD, [place] [phone]

## 2018-09-12 ENCOUNTER — Other Ambulatory Visit: Payer: Self-pay | Admitting: Pharmacist

## 2018-09-12 ENCOUNTER — Other Ambulatory Visit: Payer: Self-pay | Admitting: Pharmacy Technician

## 2018-09-12 NOTE — Patient Outreach (Signed)
[place] [facility]) Care Management  09/12/2018  MICAL BRUN [birth year] [id number]   Received [place] Cares and [place] patient assistance referral from [place] for [employer] and Engineer, agricultural. Colleen requested that I fax entire sets of applications to Dr. Josephine Cables office where [place] will help patient fill out applications at appointment. I also faxed a "Letter of Hardship" for patient to sign as well.  Will follow up with Colleen in 7-10 business days to confirm if I need to follow up with providers office or patient assistance companies.  [name] [place], [place] Management [phone]

## 2018-09-12 NOTE — Patient Outreach (Addendum)
[name]) Care Management  09/12/2018  Natalie Castro [birth year] [id number]  77 year old female referred to [place] for medication management.  Patient well known to [place] as she has had frequent follow-up for medication assistance since May 2018 for Eliquis and Lantus, both approved via PAPs last year.    PMHx includeshypertension, complete heart block, history of DVT and saddle pulmonary embolus, type 2 diabetes (controlled), arthritis, positive FIT.   Patient withUnited HealthcareMedicare Advantage plan.   Per notes, patient currently staying with daughter in [place], [place].  Previously receiving pill packaging through [place].    Successful call placed to [name] today.  HIPAA identifiers verified. Patient reports she is staying with a daughter temporarily and hopes to return to her own home in 2-3 months.  She reports she recently had to pay $147 for Lantus and all other medications totaled $75.  She confirms she received patient assistance for Lantus and Eliquis last year and therefore did not need to have these filled from pharmacy until June and April respectively.  She is not sure what she has spent since January on medications and does not have her financial information.  She thinks her financial documents are at her other house.    Care Coordination call placed to Natalie Castro. Co-pays confirmed as follows:   Lantus - June $90 / 50 DS, Sept 13 $147 / 58 DS  Eliquis - April $140 /30 DS (must have had deductible), then paid $45 / 30DS   TROOP = [phone]   Patient still receiving monthly delivery of compliance packs from pharmacy  Medication Assistance:  Extra Help: Not eligible based on reported income.    Patient Assistance Programs:   Lantus:  PAP via Sanofi: Patient meets income requirements, but not TROOP (5% income, estimated around $1440).  Could consider changing to [employer] and apply for [employer] with letter of financial hardship  to avoid TROOP requirement OR wait until patient spends additional ~$600  Call placed to Natalie Castro office - ok to substitute to [employer] per RN.  RN will assist patient tomorrow with completing application.    Eliquis: PAP via BMS: Meets income requirements, and very close to meeting TROOP (3% income, estimated at [phone]) - needs to spend $21.15 more.  Patient to find financial information and bring with her to appointment with Natalie Castro tomorrow at 2:30 PM  Plan: I will route patient assistance letter to pharmacy technician, Etter Sjogren, who will coordinate application process for Eliquis and Basaglar .  She will assist with obtaining all pertinent documents from both patient and Natalie Castro and submit application once completed.    Applications will be faxed to Natalie Castro office directly so patient can complete during office visit tomorrow.  MD office will then fax back to [facility].    Natalie Castro, PharmD, [place] [phone]

## 2018-09-13 DIAGNOSIS — Z23 Encounter for immunization: Secondary | ICD-10-CM | POA: Diagnosis not present

## 2018-09-13 DIAGNOSIS — E1140 Type 2 diabetes mellitus with diabetic neuropathy, unspecified: Secondary | ICD-10-CM | POA: Diagnosis not present

## 2018-09-13 DIAGNOSIS — I10 Essential (primary) hypertension: Secondary | ICD-10-CM | POA: Diagnosis not present

## 2018-09-13 DIAGNOSIS — I2782 Chronic pulmonary embolism: Secondary | ICD-10-CM | POA: Diagnosis not present

## 2018-09-19 ENCOUNTER — Other Ambulatory Visit: Payer: Self-pay | Admitting: Pharmacist

## 2018-09-19 LAB — CUP PACEART REMOTE DEVICE CHECK
Brady Statistic AP VP Percent: 1 %
Brady Statistic AP VS Percent: 1 %
Brady Statistic AS VS Percent: 95 %
Brady Statistic RV Percent Paced: 3.8 %
Date Time Interrogation Session: 20190827065018
Implantable Lead Implant Date: 20161129
Implantable Lead Location: 753860
Lead Channel Impedance Value: 490 Ohm
Lead Channel Pacing Threshold Amplitude: 0.75 V
Lead Channel Pacing Threshold Pulse Width: 0.4 ms
Lead Channel Sensing Intrinsic Amplitude: 4.6 mV
Lead Channel Setting Pacing Amplitude: 2 V
Lead Channel Setting Pacing Pulse Width: 0.4 ms
Lead Channel Setting Sensing Sensitivity: 2 mV
MDC IDC LEAD IMPLANT DT: 20161129
MDC IDC LEAD LOCATION: 753859
MDC IDC MSMT BATTERY REMAINING LONGEVITY: 127 mo
MDC IDC MSMT BATTERY REMAINING PERCENTAGE: 95.5 %
MDC IDC MSMT BATTERY VOLTAGE: 3.01 V
MDC IDC MSMT LEADCHNL RA IMPEDANCE VALUE: 360 Ohm
MDC IDC MSMT LEADCHNL RA PACING THRESHOLD AMPLITUDE: 0.75 V
MDC IDC MSMT LEADCHNL RA PACING THRESHOLD PULSEWIDTH: 0.4 ms
MDC IDC MSMT LEADCHNL RV SENSING INTR AMPL: 9.3 mV
MDC IDC PG IMPLANT DT: 20161129
MDC IDC PG SERIAL: 7826037
MDC IDC SET LEADCHNL RA PACING AMPLITUDE: 1.75 V
MDC IDC STAT BRADY AS VP PERCENT: 3.8 %
MDC IDC STAT BRADY RA PERCENT PACED: 1 %

## 2018-09-19 NOTE — Patient Outreach (Signed)
[place]atalie [facility]) Care Management  09/19/2018  Natalie Castro 1941/08/01 [id number]  77 y.o. year old female referred to [place] for medication.   Care coordination call placed to Dr. Josephine Cables office.  Provider portion for PAPs for Eliquis and Basaglar completed.  Patient took patient portion home to show her daughter and will bring back to office completed.  Office confirms they still have cover sheet with [name] fax number to return to [name] when able.   Successful call to Natalie Castro.  Patient reports she has completed her portion of applications and will have her granddaughter return to Dr. Josephine Cables office today.   Plan: Await receipt of PAP applications from Dr. Legrand Rams.   Ralene Bathe, PharmD, [place] [phone]

## 2018-09-20 ENCOUNTER — Other Ambulatory Visit: Payer: Self-pay | Admitting: Pharmacy Technician

## 2018-09-20 NOTE — Patient Outreach (Signed)
[facility]) Care Management  09/20/2018  Natalie Castro Apr 25, 1941 [id number]   Successful outreach call to patient, HIPAA identifiers verified. Informed [name] that Dr. Josephine Cables office faxed over her application and other documents. I informed her that I need proof of her spouses income as well. She stated that she has it and she would have it faxed over to me.  Once document is received, will fax completed applications into companies.  [name] [place], [place] Management [phone]

## 2018-09-24 ENCOUNTER — Other Ambulatory Visit: Payer: Self-pay | Admitting: Pharmacy Technician

## 2018-09-24 NOTE — Patient Outreach (Signed)
[place] [facility]) Care Management  09/24/2018  Natalie Castro October 17, 1941 [id number]   Received financial documents from patient. Faxed completed application and required documents into [employer] and [employer].  Will follow up with [place] in 10-14 business days to check status of applications.  [name] [place], [place] Management [phone]

## 2018-09-26 ENCOUNTER — Ambulatory Visit: Payer: Self-pay | Admitting: Pharmacist

## 2018-10-02 ENCOUNTER — Ambulatory Visit: Payer: Medicare Other | Admitting: Pharmacy Technician

## 2018-10-02 ENCOUNTER — Other Ambulatory Visit: Payer: Self-pay | Admitting: Pharmacy Technician

## 2018-10-02 NOTE — Patient Outreach (Signed)
[facility] [facility]) Care Management  10/02/2018  Natalie Castro Oct 12, 1941 [id number]   Follow up call to check status of patients application for Basaglar. Virgilio Belling stated that is in "Specialty" review since Letter of hardship was included.  Will follow up with company in 7-10 business days to check for updated status.  [name] [place], [place] Management [phone]

## 2018-10-04 ENCOUNTER — Ambulatory Visit: Payer: Medicare Other | Admitting: Pharmacy Technician

## 2018-10-09 ENCOUNTER — Encounter: Payer: Self-pay | Admitting: Gastroenterology

## 2018-10-10 ENCOUNTER — Other Ambulatory Visit: Payer: Self-pay | Admitting: Pharmacy Technician

## 2018-10-10 NOTE — Patient Outreach (Signed)
[place] [facility]) Care Management  10/10/2018  BRYNDA HEICK [birth year] [id number]   Follow up call to check status of patient application for Eliquis. Felicia stated that patients application is still under review.  Will follow up with company in 3-5 business days to check status.  [name] [place], [place] Management [phone]

## 2018-10-11 ENCOUNTER — Other Ambulatory Visit: Payer: Self-pay | Admitting: Pharmacy Technician

## 2018-10-11 NOTE — Patient Outreach (Signed)
[facility] [facility]) Care Management  10/11/2018  Natalie Castro [birth year] [id number]   Follow up call to check status of patients application for Basaglar. [name] stated that application is still under review.  Will follow up with [employer] in 2-3 business days to check status.  [name], [place] Management [phone]

## 2018-10-15 ENCOUNTER — Other Ambulatory Visit: Payer: Self-pay | Admitting: Pharmacy Technician

## 2018-10-15 ENCOUNTER — Telehealth: Payer: Self-pay | Admitting: *Deleted

## 2018-10-15 DIAGNOSIS — I472 Ventricular tachycardia, unspecified: Secondary | ICD-10-CM

## 2018-10-15 NOTE — Telephone Encounter (Signed)
Spoke to patient regarding alert for VT x 21sec on 10/05/18. Patient denies any sx's on the day of the episodes.  Episode reviewed with Dr.Allred - recommendation, BMP, MAG, CBC, TSH, Echo and f/u with GT/R.   Informed patient of recommendations. Patient verbalized understanding.  Will contact [place] office to help arrange labs/echo.

## 2018-10-15 NOTE — Patient Outreach (Signed)
[facility]) Care Management  10/15/2018  Natalie Castro [birth year] [id number]   Follow up call to check status of application for Eliquis. Darlyne Russian stated that application is still being reviewed and stated that I should follow back up around Thursday the 24th.  Will follow up with [place] in 3-4 business days.  [name] Guy, [place] Management [phone]

## 2018-10-18 ENCOUNTER — Other Ambulatory Visit: Payer: Self-pay | Admitting: Pharmacy Technician

## 2018-10-18 NOTE — Patient Outreach (Signed)
[facility]) Care Management  10/18/2018  JEANETTA ALONZO 07/02/1941 [id number]  Follow up call to [place] patient assistance to inquire about the status of patient's applications for Eliquis & Basaglar respectively.  Spoke to Idalia at [facility] who confirms patient was approved for Eliquis from 10/15/2018 thru 12/25/2018. She said med is being shipped to patient's home within 7-10 business days of approval date.  Spoke to Zachary at [employer] who confirms patient was approved for [employer] from 10/18/2018 thru 12/25/18. Patient will receive enough medication until the end of the enrollment period and the medication would be shipped to the physician's office within 3-7 business days.  Will route note to [place] for followup.  Shahrukh Pasch P. Asami Lambright, [place] Management [phone]

## 2018-10-23 NOTE — Telephone Encounter (Signed)
Dr.Taylor has no availability until March 2019. Patient may need to be followed @ [facility] if she needs sooner appointment. / tg

## 2018-10-23 NOTE — Telephone Encounter (Signed)
error 

## 2018-10-26 ENCOUNTER — Ambulatory Visit (HOSPITAL_COMMUNITY)
Admission: RE | Admit: 2018-10-26 | Discharge: 2018-10-26 | Disposition: A | Discharge status: Home / Self Care | Payer: Medicare Other | Source: Ambulatory Visit | Attending: Internal Medicine | Admitting: Internal Medicine

## 2018-10-26 DIAGNOSIS — I472 Ventricular tachycardia, unspecified: Secondary | ICD-10-CM

## 2018-10-26 NOTE — Progress Notes (Signed)
*  PRELIMINARY RESULTS* Echocardiogram 2D Echocardiogram has been performed.  Natalie Castro 10/26/2018, 1:53 PM

## 2018-10-29 ENCOUNTER — Other Ambulatory Visit: Payer: Self-pay | Admitting: Internal Medicine

## 2018-10-30 ENCOUNTER — Other Ambulatory Visit: Payer: Self-pay | Admitting: Pharmacy Technician

## 2018-10-30 NOTE — Patient Outreach (Signed)
[facility]) Care Management  10/30/2018  JENIECE HANNIS [birth year] [id number]   Successful call to patient, HIPAA identifiers verified. Mrs. Vanschaick confirmed that she has received her Basaglar from providers office but she has not received Eliquis from [employer].  Follow up call to [employer] to check status of shipment, Wyatt Portela stated that drug had not been shipped. Wyatt Portela stated that she will have order placed and patient will received medication at her home tomorrow.  Return call to patient ans informed her that Eliquis will be delivered to her home tomorrow and requested that she please call me if its not received.  Will route note to [place] for case closure.  [name] Chana Bode [place] Certified Pharmacy Technician [place] Management Direct Dial:[phone]

## 2018-10-31 ENCOUNTER — Telehealth: Payer: Self-pay | Admitting: *Deleted

## 2018-10-31 NOTE — Telephone Encounter (Signed)
-----   Message from Evans Lance, MD sent at 10/30/2018 10:03 PM EST ----- Her heart pumping function is normal.

## 2018-10-31 NOTE — Telephone Encounter (Signed)
Called patient with test results. No answer. Unable to leave msg.  

## 2018-11-01 ENCOUNTER — Other Ambulatory Visit (HOSPITAL_COMMUNITY)
Admission: RE | Admit: 2018-11-01 | Discharge: 2018-11-01 | Disposition: A | Discharge status: Home / Self Care | Payer: Medicare Other | Source: Ambulatory Visit | Attending: Internal Medicine | Admitting: Internal Medicine

## 2018-11-01 DIAGNOSIS — I472 Ventricular tachycardia: Secondary | ICD-10-CM | POA: Diagnosis not present

## 2018-11-01 LAB — BASIC METABOLIC PANEL
Anion gap: 8 (ref 5–15)
BUN: 11 mg/dL (ref 8–23)
CALCIUM: 9 mg/dL (ref 8.9–10.3)
CO2: 25 mmol/L (ref 22–32)
CREATININE: 0.85 mg/dL (ref 0.44–1.00)
Chloride: 106 mmol/L (ref 98–111)
GFR calc non Af Amer: >60 mL/min (ref >60)
Glucose, Bld: 240 mg/dL — ABNORMAL HIGH (ref 70–99)
Potassium: 3.7 mmol/L (ref 3.5–5.1)
SODIUM: 139 mmol/L (ref 135–145)

## 2018-11-01 LAB — MAGNESIUM: MAGNESIUM: 1.4 mg/dL — AB (ref 1.7–2.4)

## 2018-11-01 LAB — CBC
HCT: 41.1 % (ref 36.0–46.0)
Hemoglobin: 12.5 g/dL (ref 12.0–15.0)
MCH: 27.5 pg (ref 26.0–34.0)
MCHC: 30.4 g/dL (ref 30.0–36.0)
MCV: 90.3 fL (ref 80.0–100.0)
PLATELETS: 235 10*3/uL (ref 150–400)
RBC: 4.55 MIL/uL (ref 3.87–5.11)
RDW: 14 % (ref 11.5–15.5)
WBC: 6.9 10*3/uL (ref 4.0–10.5)
nRBC: 0 % (ref 0.0–0.2)

## 2018-11-01 LAB — TSH: TSH: 0.573 u[IU]/mL (ref 0.350–4.500)

## 2018-11-05 ENCOUNTER — Telehealth: Payer: Self-pay

## 2018-11-05 ENCOUNTER — Other Ambulatory Visit: Payer: Self-pay | Admitting: Pharmacist

## 2018-11-05 MED ORDER — MAGNESIUM GLUCONATE 500 MG PO TABS: 500.0000 mg | ORAL_TABLET | Freq: Every day | ORAL | 3 refills | Status: DC

## 2018-11-05 NOTE — Telephone Encounter (Signed)
-----   Message from Evans Lance, MD sent at 11/01/2018  4:35 PM EST ----- TSH is better

## 2018-11-05 NOTE — Patient Outreach (Signed)
[facility]) Care Management  [place] 11/05/2018  SHAMA MONFILS March 10, 1941 [id number]  Reason for call: f/u on arrival of Eliquis from BMS   Successful call to Ms. Karras.  Patient reports she received Eliquis and is also using Basaglar.  She denies having any questions on medications. She has my contact number if she needs to reach out to me in 2020 to re-apply.   [facility] pharmacy case is being closed due to the following reasons:  Goals have been met.   Thank you for allowing [facility] pharmacy to be involved in this patient's care.    Ralene Bathe, PharmD, [place] [phone]

## 2018-11-05 NOTE — Telephone Encounter (Signed)
Per Dr. Lovena Le have Pt start magnesium supplement.  Sent to Pt pharmacy.  Pt aware.  All questions answered.

## 2018-11-20 ENCOUNTER — Ambulatory Visit (INDEPENDENT_AMBULATORY_CARE_PROVIDER_SITE_OTHER): Payer: Medicare Other

## 2018-11-20 DIAGNOSIS — I472 Ventricular tachycardia, unspecified: Secondary | ICD-10-CM

## 2018-11-20 DIAGNOSIS — I442 Atrioventricular block, complete: Secondary | ICD-10-CM | POA: Diagnosis not present

## 2018-11-20 NOTE — Progress Notes (Signed)
Remote pacemaker transmission.   

## 2018-12-11 DIAGNOSIS — E1140 Type 2 diabetes mellitus with diabetic neuropathy, unspecified: Secondary | ICD-10-CM | POA: Diagnosis not present

## 2018-12-11 DIAGNOSIS — I10 Essential (primary) hypertension: Secondary | ICD-10-CM | POA: Diagnosis not present

## 2018-12-11 DIAGNOSIS — I2782 Chronic pulmonary embolism: Secondary | ICD-10-CM | POA: Diagnosis not present

## 2018-12-17 ENCOUNTER — Other Ambulatory Visit: Payer: Self-pay | Admitting: Pharmacy Technician

## 2018-12-17 NOTE — Patient Outreach (Signed)
[facility]) Care Management  12/17/2018  BRENN GATTON [birth year] [id number]    Successful call placed to patient regarding patient assistance application(s) for [place] re-enrollment for 2020. , HIPAA identifiers verified. Patient is agreeable to reapplying for Basaglar with a letter of hardship. Will prepare application to be mailed to patient.  Will follow up with patient in 5-10 business days due to holidays to confirm application has been received.   [name] Chana Bode [place] Certified Pharmacy Technician [place] Management Direct Dial:[phone]

## 2018-12-18 ENCOUNTER — Encounter: Payer: Self-pay | Admitting: Pharmacy Technician

## 2019-01-04 ENCOUNTER — Other Ambulatory Visit: Payer: Self-pay | Admitting: Pharmacy Technician

## 2019-01-04 NOTE — Patient Outreach (Signed)
[facility]) Care Management  01/04/2019  Natalie Castro 1941-08-24 [id number]    Successful call placed to patient regarding patient assistance application(s) for Basaglar , HIPAA identifiers verified. [name] confirms that she received application that was mailed. She wanted to confirm that she is allowed to apply now since she has not reached "Donut hole". She states that she will fill it out and send it back into me.  Will follow up with patient is documents have not bee received in the next 10-14 business days.   [name] Chana Bode [place] Certified Pharmacy Technician [place] Management Direct Dial:[phone]

## 2019-01-10 ENCOUNTER — Other Ambulatory Visit: Payer: Self-pay | Admitting: Pharmacy Technician

## 2019-01-10 NOTE — Patient Outreach (Signed)
[facility]) Care Management  01/10/2019  Natalie Castro 1941/08/24 [id number]   Incoming call from patient to inform me that she had not mailed [place] [employer] application for [employer] back in due to he husband passing. She states that she hopes to get around to it after his funeral. I expressed my condolences and informed her that I would follow up with her in about a month or so in case she needs to be reminded of application. She stated that would be fine.  Will follow up with patient in 21-28 business days if documents have not been received.  [name] Chana Bode [place] Certified Pharmacy Technician [place] Management Direct Dial:[phone]

## 2019-01-11 LAB — CUP PACEART REMOTE DEVICE CHECK
Battery Remaining Longevity: 127 mo
Battery Remaining Percentage: 95.5 %
Brady Statistic AP VS Percent: 1 %
Brady Statistic AS VS Percent: 95 %
Brady Statistic RA Percent Paced: 1 %
Brady Statistic RV Percent Paced: 3.9 %
Date Time Interrogation Session: 20191126070014
Implantable Lead Implant Date: 20161129
Implantable Lead Location: 753860
Implantable Pulse Generator Implant Date: 20161129
Lead Channel Impedance Value: 390 Ohm
Lead Channel Impedance Value: 480 Ohm
Lead Channel Pacing Threshold Amplitude: 0.75 V
Lead Channel Pacing Threshold Pulse Width: 0.4 ms
Lead Channel Sensing Intrinsic Amplitude: 4.5 mV
Lead Channel Sensing Intrinsic Amplitude: 8.2 mV
Lead Channel Setting Pacing Amplitude: 1.75 V
Lead Channel Setting Pacing Amplitude: 2 V
Lead Channel Setting Pacing Pulse Width: 0.4 ms
MDC IDC LEAD IMPLANT DT: 20161129
MDC IDC LEAD LOCATION: 753859
MDC IDC MSMT BATTERY VOLTAGE: 3.01 V
MDC IDC MSMT LEADCHNL RA PACING THRESHOLD AMPLITUDE: 0.75 V
MDC IDC MSMT LEADCHNL RA PACING THRESHOLD PULSEWIDTH: 0.4 ms
MDC IDC SET LEADCHNL RV SENSING SENSITIVITY: 2 mV
MDC IDC STAT BRADY AP VP PERCENT: 1 %
MDC IDC STAT BRADY AS VP PERCENT: 3.8 %
Pulse Gen Serial Number: 7826037

## 2019-02-11 ENCOUNTER — Ambulatory Visit: Payer: Self-pay | Admitting: Pharmacy Technician

## 2019-02-13 ENCOUNTER — Other Ambulatory Visit: Payer: Self-pay | Admitting: Pharmacy Technician

## 2019-02-13 NOTE — Patient Outreach (Signed)
[facility]) Care Management  02/13/2019  Natalie Castro Jan 13, 1941 [id number]   Incoming call from patient stating she has started filling out patient assistance application for [place] and that she will mail it back in as soon as she makes a copy of her income statement.  Will follow up with patient in 14-21 business days if document has not been received.  [name] Chana Bode [place] Certified Pharmacy Technician [place] Management Direct Dial:[phone]

## 2019-02-19 ENCOUNTER — Ambulatory Visit (INDEPENDENT_AMBULATORY_CARE_PROVIDER_SITE_OTHER): Payer: Medicare Other | Admitting: *Deleted

## 2019-02-19 DIAGNOSIS — I442 Atrioventricular block, complete: Secondary | ICD-10-CM

## 2019-02-20 ENCOUNTER — Telehealth: Payer: Self-pay

## 2019-02-20 NOTE — Telephone Encounter (Signed)
Unable to leave a message for patient to remind of missed remote transmission.  

## 2019-02-24 LAB — CUP PACEART REMOTE DEVICE CHECK
Battery Remaining Longevity: 128 mo
Battery Remaining Percentage: 95.5 %
Battery Voltage: 3.01 V
Brady Statistic AP VS Percent: 1 %
Brady Statistic RA Percent Paced: 1 %
Brady Statistic RV Percent Paced: 3.7 %
Date Time Interrogation Session: 20200301041129
Implantable Lead Implant Date: 20161129
Implantable Lead Location: 753859
Implantable Lead Location: 753860
Implantable Lead Model: 5076
Implantable Pulse Generator Implant Date: 20161129
Lead Channel Impedance Value: 400 Ohm
Lead Channel Pacing Threshold Amplitude: 0.75 V
Lead Channel Pacing Threshold Pulse Width: 0.4 ms
Lead Channel Sensing Intrinsic Amplitude: 4.4 mV
Lead Channel Setting Sensing Sensitivity: 2 mV
MDC IDC LEAD IMPLANT DT: 20161129
MDC IDC MSMT LEADCHNL RV IMPEDANCE VALUE: 490 Ohm
MDC IDC MSMT LEADCHNL RV PACING THRESHOLD AMPLITUDE: 0.75 V
MDC IDC MSMT LEADCHNL RV PACING THRESHOLD PULSEWIDTH: 0.4 ms
MDC IDC MSMT LEADCHNL RV SENSING INTR AMPL: 9.3 mV
MDC IDC SET LEADCHNL RA PACING AMPLITUDE: 1.75 V
MDC IDC SET LEADCHNL RV PACING AMPLITUDE: 2 V
MDC IDC SET LEADCHNL RV PACING PULSEWIDTH: 0.4 ms
MDC IDC STAT BRADY AP VP PERCENT: 1 %
MDC IDC STAT BRADY AS VP PERCENT: 3.7 %
MDC IDC STAT BRADY AS VS PERCENT: 96 %
Pulse Gen Model: 2240
Pulse Gen Serial Number: 7826037

## 2019-02-26 ENCOUNTER — Encounter: Payer: Self-pay | Admitting: Cardiology

## 2019-02-26 NOTE — Progress Notes (Signed)
Remote pacemaker transmission.   

## 2019-03-12 DIAGNOSIS — I2782 Chronic pulmonary embolism: Secondary | ICD-10-CM | POA: Diagnosis not present

## 2019-03-12 DIAGNOSIS — Z0001 Encounter for general adult medical examination with abnormal findings: Secondary | ICD-10-CM | POA: Diagnosis not present

## 2019-03-12 DIAGNOSIS — Z1389 Encounter for screening for other disorder: Secondary | ICD-10-CM | POA: Diagnosis not present

## 2019-03-12 DIAGNOSIS — E1140 Type 2 diabetes mellitus with diabetic neuropathy, unspecified: Secondary | ICD-10-CM | POA: Diagnosis not present

## 2019-03-27 ENCOUNTER — Encounter: Payer: Medicare Other | Admitting: Internal Medicine

## 2019-04-03 ENCOUNTER — Other Ambulatory Visit: Payer: Self-pay

## 2019-04-03 NOTE — Patient Outreach (Signed)
[place] [facility]) Care Management  04/03/2019  Natalie Castro May 31, 1941 [id number]   Medication Adherence call to [name] patient did not answer patient is past due on Metformin 1000 mg, Atorvastatin 40 mg and Losartan 100 mg. [facility] said they pill pack every 20th of the month and deliver for patient. [name] is showing due under [place].   [place] Management Direct Dial [phone]  Fax [phone] [email or web address]

## 2019-05-03 ENCOUNTER — Other Ambulatory Visit: Payer: Self-pay | Admitting: Pharmacy Technician

## 2019-05-03 NOTE — Patient Outreach (Signed)
[place] [facility]) Care Management  05/03/2019  Natalie Castro [birth year] [id number]   Per last conversation with patient, she stated she would mail application for [employer] back in. Have not received documents at this time.   Will remove myself from care team.  [name]. Chana Bode [place] Certified Pharmacy Technician [place] Management Direct Dial:[phone]

## 2019-05-15 ENCOUNTER — Other Ambulatory Visit: Payer: Self-pay | Admitting: *Deleted

## 2019-05-21 ENCOUNTER — Ambulatory Visit (INDEPENDENT_AMBULATORY_CARE_PROVIDER_SITE_OTHER): Payer: Medicare Other | Admitting: *Deleted

## 2019-05-21 ENCOUNTER — Other Ambulatory Visit (HOSPITAL_COMMUNITY): Payer: Self-pay | Admitting: Internal Medicine

## 2019-05-21 DIAGNOSIS — Z1231 Encounter for screening mammogram for malignant neoplasm of breast: Secondary | ICD-10-CM

## 2019-05-21 DIAGNOSIS — I442 Atrioventricular block, complete: Secondary | ICD-10-CM | POA: Diagnosis not present

## 2019-05-21 DIAGNOSIS — I472 Ventricular tachycardia, unspecified: Secondary | ICD-10-CM

## 2019-05-21 LAB — CUP PACEART REMOTE DEVICE CHECK
Date Time Interrogation Session: 20200526115848
Implantable Lead Implant Date: 20161129
Implantable Lead Implant Date: 20161129
Implantable Lead Location: 753859
Implantable Lead Location: 753860
Implantable Lead Model: 5076
Implantable Pulse Generator Implant Date: 20161129
Pulse Gen Model: 2240
Pulse Gen Serial Number: 7826037

## 2019-05-23 ENCOUNTER — Telehealth: Payer: Self-pay | Admitting: Internal Medicine

## 2019-05-23 NOTE — Telephone Encounter (Signed)
Virtual Visit Pre-Appointment Phone Call  "(Name), I am calling you today to discuss your upcoming appointment. We are currently trying to limit exposure to the virus that causes COVID-19 by seeing patients at home rather than in the office."  1. "What is the BEST phone number to call the day of the visit?" - include this in appointment notes  2. Do you have or have access to (through a family member/friend) a smartphone with video capability that we can use for your visit?" a. If yes - list this number in appt notes as cell (if different from BEST phone #) and list the appointment type as a VIDEO visit in appointment notes b. If no - list the appointment type as a PHONE visit in appointment notes  3. Confirm consent - "In the setting of the current Covid19 crisis, you are scheduled for a (phone or video) visit with your provider on (date) at (time).  Just as we do with many in-office visits, in order for you to participate in this visit, we must obtain consent.  If you'd like, I can send this to your mychart (if signed up) or email for you to review.  Otherwise, I can obtain your verbal consent now.  All virtual visits are billed to your insurance company just like a normal visit would be.  By agreeing to a virtual visit, we'd like you to understand that the technology does not allow for your provider to perform an examination, and thus may limit your provider's ability to fully assess your condition. If your provider identifies any concerns that need to be evaluated in person, we will make arrangements to do so.  Finally, though the technology is pretty good, we cannot assure that it will always work on either your or our end, and in the setting of a video visit, we may have to convert it to a phone-only visit.  In either situation, we cannot ensure that we have a secure connection.  Are you willing to proceed?" STAFF: Did the patient verbally acknowledge consent to telehealth visit? Document  YES/NO here: Yes  4. Advise patient to be prepared - "Two hours prior to your appointment, go ahead and check your blood pressure, pulse, oxygen saturation, and your weight (if you have the equipment to check those) and write them all down. When your visit starts, your provider will ask you for this information. If you have an Apple Watch or Kardia device, please plan to have heart rate information ready on the day of your appointment. Please have a pen and paper handy nearby the day of the visit as well."  5. Give patient instructions for MyChart download to smartphone OR Doximity/Doxy.me as below if video visit (depending on what platform provider is using)  6. Inform patient they will receive a phone call 15 minutes prior to their appointment time (may be from unknown caller ID) so they should be prepared to answer    TELEPHONE CALL NOTE  Natalie Castro has been deemed a candidate for a follow-up tele-health visit to limit community exposure during the Covid-19 pandemic. I spoke with the patient via phone to ensure availability of phone/video source, confirm preferred email & phone number, and discuss instructions and expectations.  I reminded Natalie Castro to be prepared with any vital sign and/or heart rhythm information that could potentially be obtained via home monitoring, at the time of her visit. I reminded Natalie Castro to expect a phone call prior to  her visit.  Terry L Goins 05/23/2019 12:00 PM

## 2019-05-29 ENCOUNTER — Other Ambulatory Visit: Payer: Self-pay

## 2019-05-29 ENCOUNTER — Telehealth (INDEPENDENT_AMBULATORY_CARE_PROVIDER_SITE_OTHER): Payer: Medicare Other | Admitting: Internal Medicine

## 2019-05-29 DIAGNOSIS — I442 Atrioventricular block, complete: Secondary | ICD-10-CM

## 2019-05-29 DIAGNOSIS — Z950 Presence of cardiac pacemaker: Secondary | ICD-10-CM | POA: Diagnosis not present

## 2019-05-29 NOTE — Progress Notes (Signed)
Electrophysiology TeleHealth Note   Due to national recommendations of social distancing due to COVID 19, an audio/video telehealth visit is felt to be most appropriate for this patient at this time.  See MyChart message from today for the patient's consent to telehealth for [facility].   Date:  05/29/2019   ID:  Natalie Castro, DOB 01-Aug-1941, MRN [id number]  Location: patient's home  Provider location: [address], [place]  Evaluation Performed: Follow-up visit  PCP:  Natalie Fire, MD  Cardiologist:  No primary care provider on file.  Electrophysiologist:  Dr Lovena Le  Chief Complaint:  "I'm doing good."  History of Present Illness:    Natalie Castro is a 78 y.o. female who presents via audio/video conferencing for a telehealth visit today.  She is a pleasant 78 yo woman with CHB, s/p PPM insertion, HTN, and obesity. Since last being seen in our clinic, the patient reports doing very well.  Today, she denies symptoms of palpitations, chest pain, shortness of breath,  lower extremity edema, dizziness, presyncope, or syncope.  The patient is otherwise without complaint today.  The patient denies symptoms of fevers, chills, cough, or new SOB worrisome for COVID 19.  Past Medical History:  Diagnosis Date  . Acid reflux   . Arthritis   . Colon adenomas    AGE 38  . Complete heart block (HCC)    STJ PPM Dr. [name] 11/24/15  . Essential hypertension   . Hyperlipidemia   . Presence of permanent cardiac pacemaker   . Type 2 diabetes mellitus ([place])     Past Surgical History:  Procedure Laterality Date  . COLONOSCOPY     3 SIMPLE ADENOMAS, AGE 62  . COLONOSCOPY N/A 05/24/2018   Procedure: COLONOSCOPY;  Surgeon: Danie Binder, MD;  Location: [facility] SUITE;  Service: Endoscopy;  Laterality: N/A;  1:00pm  . EP IMPLANTABLE DEVICE N/A 11/24/2015   Procedure: Pacemaker Implant;  Surgeon: [name], MD;  Location: [facility] LAB;  Service: Cardiovascular;   Laterality: N/A;  . JOINT REPLACEMENT     knees bilat.  Marland Kitchen POLYPECTOMY  05/24/2018   Procedure: POLYPECTOMY;  Surgeon: Danie Binder, MD;  Location: [facility] SU[facility];  Service: Endoscopy;;  ascending and hepatic flexure, transverse  . TUBAL LIGATION      Current Outpatient Medications  Medication Sig Dispense Refill  . apixaban (ELIQUIS) 5 MG TABS tablet Take 5 mg by mouth 2 (two) times daily.    Marland Kitchen atorvastatin (LIPITOR) 40 MG tablet Take 40 mg by mouth at bedtime.     . hydrALAZINE (APRESOLINE) 25 MG tablet Take 25 mg by mouth 2 (two) times daily.    Marland Kitchen LANTUS SOLOSTAR 100 UNIT/ML Solostar Pen Inject 30 Units into the skin at bedtime.     Marland Kitchen losartan (COZAAR) 100 MG tablet Take 100 mg by mouth every morning.     . magnesium gluconate (MAGONATE) 500 MG tablet Take 1 tablet (500 mg total) by mouth daily. 90 tablet 3  . metFORMIN (GLUCOPHAGE) 1000 MG tablet Take 1,000 mg by mouth 2 (two) times daily with a meal.    . NIFEdipine (PROCARDIA-XL/ADALAT CC) 30 MG 24 hr tablet Take 30 mg by mouth daily.    . ranitidine (ZANTAC) 300 MG tablet Take 300 mg by mouth every morning.      No current facility-administered medications for this visit.     Allergies:   Penicillins   Social History:  The patient  reports that she has never smoked. She has never used smokeless tobacco. She reports that she does not drink alcohol or use drugs.   Family History:  The patient's  family history includes Cancer in her sister; Dementia in her sister; Heart failure in her mother and sister; Hypertension in her father and mother.   ROS:  Please see the history of present illness.   All other systems are personally reviewed and negative.    Exam:    Vital Signs:  There were no vitals taken for this visit.  Well appearing, alert and conversant, regular work of breathing,  good skin color Eyes- anicteric, neuro- grossly intact, skin- no apparent rash or lesions or cyanosis, mouth- oral mucosa is pink   Labs/Other  Tests and Data Reviewed:    Recent Labs: 11/01/2018: BUN 11; Creatinine, Ser 0.85; Hemoglobin 12.5; Magnesium 1.4; Platelets 235; Potassium 3.7; Sodium 139; TSH 0.573   Wt Readings from Last 3 Encounters:  05/24/18 215 lb (97.5 kg)  04/26/18 215 lb 12.8 oz (97.9 kg)  03/20/18 217 lb (98.4 kg)     Other studies personally reviewed  Last device remote is reviewed from [place] PDF dated 05/21/19 which reveals normal device function, no arrhythmias    ASSESSMENT & PLAN:    1.  Sinus node dysfunction - she is doing well, s/p PPM insertion. 2. PPM - her [employer] DDD PM has almost 10 years of battery longevity. 3. HTN - her pressure has been controlled. 4. COVID 19 screen The patient denies symptoms of COVID 19 at this time.  The importance of social distancing was discussed today.  Follow-up:  12 months. Next remote: 8/20  Current medicines are reviewed at length with the patient today.   The patient does not have concerns regarding her medicines.  The following changes were made today:  none  Labs/ tests ordered today include: none No orders of the defined types were placed in this encounter.    Patient Risk:  after full review of this patients clinical status, I feel that they are at moderate risk at this time.  Today, I have spent 15 minutes with the patient with telehealth technology discussing all of the above .    Signed, Cristopher Peru, MD  05/29/2019 10:10 AM     [facility] [address] [place] [address] [phone] (office) [phone] (fax)

## 2019-05-29 NOTE — Progress Notes (Signed)
Remote pacemaker transmission.   

## 2019-05-29 NOTE — Patient Instructions (Signed)
Medication Instructions:  Your physician recommends that you continue on your current medications as directed. Please refer to the Current Medication list given to you today.   Labwork: NONE   Testing/Procedures: NONE   Follow-Up: Your physician wants you to follow-up in: 1 Year with Dr. Taylor. You will receive a reminder letter in the mail two months in advance. If you don't receive a letter, please call our office to schedule the follow-up appointment.   Any Other Special Instructions Will Be Listed Below (If Applicable).     If you need a refill on your cardiac medications before your next appointment, please call your pharmacy.  Thank you for choosing [facility]!   

## 2019-06-05 ENCOUNTER — Telehealth: Payer: Self-pay | Admitting: *Deleted

## 2019-06-05 ENCOUNTER — Other Ambulatory Visit: Payer: Medicare Other

## 2019-06-05 DIAGNOSIS — Z20822 Contact with and (suspected) exposure to covid-19: Secondary | ICD-10-CM

## 2019-06-05 DIAGNOSIS — R6889 Other general symptoms and signs: Secondary | ICD-10-CM | POA: Diagnosis not present

## 2019-06-05 NOTE — Telephone Encounter (Signed)
Pt scheduled for Covid testing at [facility] site, 1030 today. Requested by Dr. [name]; possible exposure.  Practice # [phone] Fax # :  [phone]  Pts CB#  [phone]

## 2019-06-11 LAB — NOVEL CORONAVIRUS, NAA: SARS-CoV-2, NAA: NOT DETECTED

## 2019-08-21 ENCOUNTER — Ambulatory Visit (INDEPENDENT_AMBULATORY_CARE_PROVIDER_SITE_OTHER): Payer: Medicare Other | Admitting: *Deleted

## 2019-08-21 DIAGNOSIS — I442 Atrioventricular block, complete: Secondary | ICD-10-CM | POA: Diagnosis not present

## 2019-08-21 LAB — CUP PACEART REMOTE DEVICE CHECK
Battery Remaining Longevity: 128 mo
Battery Remaining Percentage: 95.5 %
Battery Voltage: 3.01 V
Brady Statistic AP VP Percent: 1 %
Brady Statistic AP VS Percent: 1 %
Brady Statistic AS VP Percent: 3.4 %
Brady Statistic AS VS Percent: 96 %
Brady Statistic RA Percent Paced: 1 %
Brady Statistic RV Percent Paced: 3.5 %
Date Time Interrogation Session: 20200825060034
Implantable Lead Implant Date: 20161129
Implantable Lead Implant Date: 20161129
Implantable Lead Location: 753859
Implantable Lead Location: 753860
Implantable Lead Model: 5076
Implantable Pulse Generator Implant Date: 20161129
Lead Channel Impedance Value: 360 Ohm
Lead Channel Impedance Value: 530 Ohm
Lead Channel Pacing Threshold Amplitude: 0.75 V
Lead Channel Pacing Threshold Amplitude: 0.75 V
Lead Channel Pacing Threshold Pulse Width: 0.4 ms
Lead Channel Pacing Threshold Pulse Width: 0.4 ms
Lead Channel Sensing Intrinsic Amplitude: 4.4 mV
Lead Channel Sensing Intrinsic Amplitude: 7.3 mV
Lead Channel Setting Pacing Amplitude: 1.75 V
Lead Channel Setting Pacing Amplitude: 2 V
Lead Channel Setting Pacing Pulse Width: 0.4 ms
Lead Channel Setting Sensing Sensitivity: 2 mV
Pulse Gen Model: 2240
Pulse Gen Serial Number: 7826037

## 2019-08-29 ENCOUNTER — Encounter: Payer: Self-pay | Admitting: Cardiology

## 2019-08-29 NOTE — Progress Notes (Signed)
Remote pacemaker transmission.   

## 2019-09-11 ENCOUNTER — Other Ambulatory Visit: Payer: Self-pay | Admitting: Pharmacy Technician

## 2019-09-11 NOTE — Patient Outreach (Addendum)
[facility]) Care Management  09/11/2019  REALITY CLENDENIN 1941-04-14 [id number]                                       Medication Assistance Referral  Referral From: Self  - Mailing address verified - Contact number verified - Prescribing provider verified  Patient states that Eliquis copay has increased to about $150 from $47.  Medication/Company: Eliquis / [employer] [place] Patient application portion:  Mailed Provider application portion: Faxed  to Dr. Legrand Rams Provider address/fax verified via: Office website  Medication/Company: Lantus Solostar / [employer] Patient application portion:  Education officer, museum portion: Faxed  to Dr. Legrand Rams Provider address/fax verified via: Office website  *Reviewed with patient that OOP spend for medications and proof of income documents are required for applications.    Follow up:  Will follow up with patient in 7-10 business days to confirm application(s) have been received.  [name] Chana Bode [place] Certified Pharmacy Technician [place] Management Direct Dial:[phone]

## 2019-09-20 ENCOUNTER — Telehealth: Payer: Self-pay | Admitting: Internal Medicine

## 2019-09-20 NOTE — Telephone Encounter (Signed)
Pt is in the doughnut hole for her Eliquis

## 2019-09-20 NOTE — Telephone Encounter (Signed)
Called pt. No answer. Unable to leave voicemail.

## 2019-09-23 NOTE — Telephone Encounter (Signed)
Called pt. No answer. Unable to leave message.  

## 2019-09-24 NOTE — Telephone Encounter (Signed)
Called pt. No answer, left message for pt to return call. Will mail pt assistance form for pt to fill out.

## 2019-09-27 DIAGNOSIS — E113393 Type 2 diabetes mellitus with moderate nonproliferative diabetic retinopathy without macular edema, bilateral: Secondary | ICD-10-CM | POA: Diagnosis not present

## 2019-09-27 LAB — HM DIABETES EYE EXAM

## 2019-10-03 ENCOUNTER — Other Ambulatory Visit: Payer: Self-pay | Admitting: Pharmacy Technician

## 2019-10-03 NOTE — Patient Outreach (Signed)
[place] [facility]) Care Management  10/03/2019  Natalie Castro 1941-07-11 [id number]    Incoming call from patient regarding patient assistance application(s) for Lantus and Eliquis , HIPAA identifiers verified. Natalie Castro called to inform me that she received applications and has obtained required documents needed. She states that she plans to mail them back in today or tomorrow.  Follow up:  Will submit applications to companies once received   [name]. Chana Bode [place] Certified Pharmacy Technician [place] Management Direct [phone]

## 2019-10-04 ENCOUNTER — Other Ambulatory Visit: Payer: Self-pay

## 2019-10-04 ENCOUNTER — Other Ambulatory Visit: Payer: Self-pay | Admitting: Pharmacy Technician

## 2019-10-04 DIAGNOSIS — Z20822 Contact with and (suspected) exposure to covid-19: Secondary | ICD-10-CM

## 2019-10-04 NOTE — Patient Outreach (Signed)
[place]) Care Management  10/04/2019  VENNIE BIFFLE 02/16/1941 [id number]   Received patient portion(s) of patient assistance application(s) for Lantus Solostar and Eliquis.   -Faxed provider portion of Eliquis application to Dr. Lovena Le for completion -Re-faxed provider portion of Lantus Solostar to Dr. Legrand Rams  Will fax to companies once documents have been received.  [name] Chana Bode [place] Certified Pharmacy Technician [place] Management Direct Dial:[phone]

## 2019-10-06 LAB — NOVEL CORONAVIRUS, NAA: SARS-CoV-2, NAA: NOT DETECTED

## 2019-10-07 ENCOUNTER — Other Ambulatory Visit: Payer: Self-pay | Admitting: Pharmacy Technician

## 2019-10-07 NOTE — Patient Outreach (Signed)
[facility]) Care Management  10/07/2019  REILLY MANGIERI Oct 06, 1941 [id number]   Received provider portion(s) of patient assistance application(s) for Eliquis. Faxed completed application and required documents into [employer].  Will follow up with company(ies) in 5-7 business days to check status of application(s).  [name] Chana Bode [place] Certified Pharmacy Technician [place] Management Direct Dial:[phone]

## 2019-10-07 NOTE — Telephone Encounter (Signed)
**Note De-Identified Natalie Castro Obfuscation** We received the provider part of a BMS pt asst application for Elquis from Etter Sjogren, CPhT with [facility] with a request to complete form and fax back to her. I called Caryl Pina and made her aware that Dr Lovena Le will not be back in the office until 10/20 and offered to have our DOD Dr [name] sign it today. Caryl Pina requested that Dr [name] sign it today as she wants to get the pts application to BMS ASAP.  We completed the provider part of the application, Dr [name] signed it and we fax it back to [place] at [phone].

## 2019-10-08 ENCOUNTER — Telehealth: Payer: Self-pay | Admitting: General Practice

## 2019-10-08 ENCOUNTER — Other Ambulatory Visit: Payer: Self-pay | Admitting: Pharmacy Technician

## 2019-10-08 NOTE — Patient Outreach (Signed)
[facility]) Care Management  10/08/2019  DARINDA GARTON Nov 09, 1941 [id number]   Received provider portion(s) of patient assistance application(s) for Lantus Solostar. Faxed completed application and required documents into [employer].  Will follow up with company(ies) in 5-7 business days to check status of application(s).  [name] Chana Bode [place] Certified Pharmacy Technician [place] Management Direct Dial:[phone]

## 2019-10-08 NOTE — Telephone Encounter (Signed)
Gave patient negative covid test results Patient understood 

## 2019-10-14 ENCOUNTER — Other Ambulatory Visit: Payer: Self-pay | Admitting: Pharmacy Technician

## 2019-10-14 NOTE — Patient Outreach (Signed)
[facility]) Care Management  10/14/2019  NIVRITI LORES 12-27-1940 [id number]    Follow up call placed to [place] regarding patient assistance application(s) for Eliquis , Lovette Cliche confirms patient has been approved as of 10/19 until 12/26/19.   Will follow up with [place] in 3-5 business days to check shipping details.  Follow up call placed to [employer] regarding patient assistance application(s) for Lantus Solostar , Rodena Piety states that application has been received but is still being processed.   Will follow up with [employer] in 2-3 business days to check application status.  [name] Chana Bode [place] Certified Pharmacy Technician [place] Management Direct [phone]

## 2019-10-17 ENCOUNTER — Other Ambulatory Visit: Payer: Self-pay | Admitting: Pharmacy Technician

## 2019-10-17 NOTE — Patient Outreach (Signed)
[place] [facility]) Care Management  10/17/2019  Natalie Castro [birth year] [id number]    Follow up call placed to [place] regarding patient assistance application(s) for Lantus Solostar , Montine Circle confirms patient has been approved as of 10/20 until 12/26/19. Medication to arrive at providers office today (10/22).  Follow up:  Will follow up with patient in 5-7 business days to confirms medications have been recieved.  [name] Chana Bode [place] Certified Pharmacy Technician [place] Management Direct [phone]

## 2019-10-22 ENCOUNTER — Other Ambulatory Visit: Payer: Self-pay | Admitting: Pharmacy Technician

## 2019-10-22 NOTE — Patient Outreach (Addendum)
[facility] [place] [facility]) Care Management  10/22/2019  SEBA TSCHOEPE [birth year] [id number]     Successful call placed to patient regarding patient assistance medication delivery of Eliquis and Lantus Solostar, HIPAA identifiers verified. Ms. Joson confirms that she received a 90 days supply of Eliquis but has not received Lantus.  Outreach call made to [facility] at Dr. Josephine Cables office, confirmed with Emmie Niemann that office had received 2 boxes of Lantus Solostar for patient.  Return call made to Ms. Carden to inform her to contact Dr. Josephine Cables office to set up a time to pick her medication up. Ms. Comte confirms that she has no additional questions at this time.  Reviewed with her on required Out of pocket spend amounts needed to reapply for programs in 2021.  Follow up:  Will route note to [place] for case closure  [name]. Chana Bode [place] Certified Pharmacy Technician [place] Management Direct Dial:[phone]

## 2019-10-31 ENCOUNTER — Other Ambulatory Visit: Payer: Self-pay | Admitting: Pharmacist

## 2019-10-31 NOTE — Patient Outreach (Signed)
[place] [facility]) Care Management  10/31/2019  Natalie Castro Jan 27, 1941 [id number]  Received message from pharmacy technician Caryl Pina, see note in chart from 10/22/2019, member received medication patient assistance for lantus solostar and is aware of medication being ready at provider's office for pick up (Dr Truett Mainland).   Plan:  Will close this case at this time.   Karrie Meres, PharmD, [place] [phone]

## 2019-11-04 ENCOUNTER — Encounter: Payer: Self-pay | Admitting: Nutrition

## 2019-11-04 ENCOUNTER — Encounter: Payer: Self-pay | Admitting: Family Medicine

## 2019-11-04 ENCOUNTER — Other Ambulatory Visit: Payer: Self-pay | Admitting: Internal Medicine

## 2019-11-04 ENCOUNTER — Other Ambulatory Visit: Payer: Self-pay

## 2019-11-04 ENCOUNTER — Ambulatory Visit (INDEPENDENT_AMBULATORY_CARE_PROVIDER_SITE_OTHER): Payer: Medicare Other | Admitting: Family Medicine

## 2019-11-04 ENCOUNTER — Encounter: Payer: Medicare Other | Attending: Family Medicine | Admitting: Nutrition

## 2019-11-04 VITALS — Wt 220.0 lb

## 2019-11-04 VITALS — BP 160/80 | HR 100 | Temp 99.2°F | Ht 65.0 in | Wt 220.8 lb

## 2019-11-04 DIAGNOSIS — Z23 Encounter for immunization: Secondary | ICD-10-CM | POA: Insufficient documentation

## 2019-11-04 DIAGNOSIS — E785 Hyperlipidemia, unspecified: Secondary | ICD-10-CM | POA: Diagnosis not present

## 2019-11-04 DIAGNOSIS — E118 Type 2 diabetes mellitus with unspecified complications: Secondary | ICD-10-CM | POA: Insufficient documentation

## 2019-11-04 DIAGNOSIS — E119 Type 2 diabetes mellitus without complications: Secondary | ICD-10-CM | POA: Diagnosis not present

## 2019-11-04 DIAGNOSIS — I442 Atrioventricular block, complete: Secondary | ICD-10-CM

## 2019-11-04 DIAGNOSIS — I10 Essential (primary) hypertension: Secondary | ICD-10-CM

## 2019-11-04 DIAGNOSIS — E1165 Type 2 diabetes mellitus with hyperglycemia: Secondary | ICD-10-CM | POA: Insufficient documentation

## 2019-11-04 DIAGNOSIS — E782 Mixed hyperlipidemia: Secondary | ICD-10-CM | POA: Diagnosis not present

## 2019-11-04 DIAGNOSIS — IMO0002 Reserved for concepts with insufficient information to code with codable children: Secondary | ICD-10-CM

## 2019-11-04 LAB — POCT GLYCOSYLATED HEMOGLOBIN (HGB A1C): Hemoglobin A1C: 10 % — AB (ref 4.0–5.6)

## 2019-11-04 LAB — POCT UA - MICROALBUMIN
A1c: <30
Creatinine, POC: 200 mg/dL
Microalbumin Ur, POC: 30 mg/L

## 2019-11-04 MED ORDER — GLUCOSE BLOOD VI STRP: ORAL_STRIP | 12 refills | Status: DC

## 2019-11-04 MED ORDER — ONETOUCH ULTRASOFT LANCETS MISC: 12 refills | Status: DC

## 2019-11-04 NOTE — Patient Instructions (Addendum)
Consider obtaining a blood pressure cuff for home use Check blood glucose fasting, before lunch and dinner and after your largest meal Referral to podiatry Referral to diabetic education

## 2019-11-04 NOTE — Patient Instructions (Signed)
Goals  Eat three balanced meals per day per MY Plate Method Cut out sodas and only drink water Test blood sugars 4 times per day and record on sheet. Bring log sheet and meter back at next appt. Goal is BS less than 150 before meals.
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
## 2019-11-04 NOTE — Progress Notes (Signed)
New Patient Office Visit  Subjective:  Patient ID: Natalie Castro, female    DOB: 1941/12/09  Age: 78 y.o. MRN: [id number]  CC:  Chief Complaint  Patient presents with  . Establish Care  . Hypertension    HPI Natalie Castro presents for HTN-takes 3 meds daily DM-pt does not know how to check your glucose readings-no classes in awhile-pt using Lantus greater than 5 years-30units-no recent change, metformin long term twice a day. Pt has not seen podiatry. Pt does have regular eye exams. Fasting blood glucose 167 taken in the office Hyperlipidemia-takes atorvastatin daily-no side effects Heart block-has a pacer-sees cardio   Past Medical History:  Diagnosis Date  . Acid reflux   . Arthritis   . Colon adenomas    AGE 57  . Complete heart block (HCC)    STJ PPM Dr. [name] 11/24/15  . Essential hypertension   . Hyperlipidemia   . Presence of permanent cardiac pacemaker   . Type 2 diabetes mellitus ([place])     Past Surgical History:  Procedure Laterality Date  . COLONOSCOPY     3 SIMPLE ADENOMAS, AGE 75  . COLONOSCOPY N/A 05/24/2018   Procedure: COLONOSCOPY;  Surgeon: Danie Binder, MD;  [facility];  Service: Endoscopy;  Laterality: N/A;  1:00pm  . EP IMPLANTABLE DEVICE N/A 11/24/2015   Procedure: Pacemaker Implant;  Surgeon: [name], MD;  Location: [place] CV LAB;  Service: Cardiovascular;  Laterality: N/A;  . JOINT REPLACEMENT     knees bilat.  Marland Kitchen POLYPECTOMY  05/24/2018   Procedure: POLYPECTOMY;  Surgeon: Danie Binder, MD;  [facility];  Service: Endoscopy;;  ascending and hepatic flexure, transverse  . TUBAL LIGATION      Family History  Problem Relation Age of Onset  . Heart failure Mother   . Hypertension Mother   . Heart failure Sister   . Hypertension Father   . Cancer Sister   . Dementia Sister   . Colon cancer Neg Hx   . Colon polyps Neg Hx     Social History   Socioeconomic History  . Marital status: Married    Spouse  name: Not on file  . Number of children: Not on file  . Years of education: Not on file  . Highest education level: Not on file  Occupational History  . Occupation: retired  Scientific laboratory technician  . Financial resource strain: Not on file  . Food insecurity    Worry: Not on file    Inability: Not on file  . Transportation needs    Medical: Not on file    Non-medical: Not on file  Tobacco Use  . Smoking status: Never Smoker  . Smokeless tobacco: Never Used  Substance and Sexual Activity  . Alcohol use: No    Alcohol/week: 0.0 standard drinks  . Drug use: No  . Sexual activity: Yes    Birth control/protection: Post-menopausal  Lifestyle  . Physical activity    Days per week: Not on file    Minutes per session: Not on file  . Stress: Not on file  Relationships  . Social Herbalist on phone: Not on file    Gets together: Not on file    Attends religious service: Not on file    Active member of club or organization: Not on file    Attends meetings of clubs or organizations: Not on file    Relationship status: Not on file  .  Intimate partner violence    Fear of current or ex partner: Not on file    Emotionally abused: Not on file    Physically abused: Not on file    Forced sexual activity: Not on file  Other Topics Concern  . Not on file  Social History Narrative   MARRIED FOR 47 YRS. 5 KIDS: #4 PRESENT TODAY(AGE 59)    ROS Review of Systems  Constitutional: Negative.   HENT: Negative.   Eyes:       Glasses-up to date  Respiratory: Negative.   Cardiovascular:       Pacemarker Uses Eliquis  Gastrointestinal: Negative.   Endocrine: Negative.   Genitourinary: Negative.   Musculoskeletal: Negative.   Allergic/Immunologic: Negative.   Neurological: Negative.   Hematological: Negative.   Psychiatric/Behavioral: Negative.     Objective:   Today's Vitals: BP (!) 160/80 (BP Location: Left Arm, Patient Position: Sitting, Cuff Size: Normal)   Pulse 100   Temp  99.2 F (37.3 C) (Oral)   Ht 5\' 5"  (1.651 m)   Wt 220 lb 12.8 oz (100.2 kg)   SpO2 96%   BMI 36.74 kg/m   Physical Exam Constitutional:      Appearance: Normal appearance.  HENT:     Head: Normocephalic and atraumatic.     Right Ear: Tympanic membrane, ear canal and external ear normal.     Left Ear: Tympanic membrane, ear canal and external ear normal.     Nose: Nose normal.     Mouth/Throat:     Mouth: Mucous membranes are moist.  Eyes:     Conjunctiva/sclera: Conjunctivae normal.  Neck:     Musculoskeletal: Normal range of motion and neck supple.  Cardiovascular:     Rate and Rhythm: Normal rate and regular rhythm.     Pulses: Normal pulses.     Heart sounds: Normal heart sounds.  Pulmonary:     Effort: Pulmonary effort is normal.     Breath sounds: Normal breath sounds.  Skin:    Comments: Right primary toe-callous  Neurological:     Mental Status: She is alert and oriented to person, place, and time.  Psychiatric:        Mood and Affect: Mood normal.        Behavior: Behavior normal.     Assessment & Plan:  1. Controlled type 2 diabetes mellitus without complication, without long-term current use of insulin ([place]) Metformin-continue Lantus-currently taking 30units nightly-pt has not been checking glucose as she can not use her monitor Monitor given as sample-demonstration of use of monitor today-needs diabetic education - POCT UA - Microalbumin - Referral to Nutrition and Diabetes Services - POCT HgB A1C - Ambulatory referral to Podiatry Callous right primary toe- 2. Essential hypertension Hydralazine/losartan/nifedipine-continue daily - COMPLETE METABOLIC PANEL WITH GFR - TSH 3. Hyperlipidemia, unspecified hyperlipidemia type atorvastatin - Lipid panel  4. Need for immunization against influenza - Flu Vaccine QUAD High Dose(Fluad)  5. Complete heart block ([place]) eliquis daily-sees cardiology  Outpatient Encounter Medications as of 11/04/2019   Medication Sig  . apixaban (ELIQUIS) 5 MG TABS tablet Take 5 mg by mouth 2 (two) times daily.  Marland Kitchen atorvastatin (LIPITOR) 40 MG tablet Take 40 mg by mouth at bedtime.   . hydrALAZINE (APRESOLINE) 25 MG tablet Take 25 mg by mouth 2 (two) times daily.  Marland Kitchen LANTUS SOLOSTAR 100 UNIT/ML Solostar Pen Inject 30 Units into the skin at bedtime.   Marland Kitchen losartan (COZAAR) 100 MG tablet Take 100 mg by  mouth every morning.   . magnesium gluconate (MAGONATE) 500 MG tablet Take 1 tablet (500 mg total) by mouth daily.  . metFORMIN (GLUCOPHAGE) 1000 MG tablet Take 1,000 mg by mouth 2 (two) times daily with a meal.  . NIFEdipine (PROCARDIA-XL/ADALAT CC) 30 MG 24 hr tablet Take 30 mg by mouth daily.  . ranitidine (ZANTAC) 300 MG tablet Take 300 mg by mouth every morning.    No facility-administered encounter medications on file as of 11/04/2019.     Follow-up: nutritional consult, 1 month f/u Temica Righetti Hannah Beat, MD

## 2019-11-06 DIAGNOSIS — I10 Essential (primary) hypertension: Secondary | ICD-10-CM | POA: Diagnosis not present

## 2019-11-06 DIAGNOSIS — E785 Hyperlipidemia, unspecified: Secondary | ICD-10-CM | POA: Diagnosis not present

## 2019-11-06 LAB — COMPLETE METABOLIC PANEL WITH GFR
AG Ratio: 1.1 (calc) (ref 1.0–2.5)
ALT: 7 U/L (ref 6–29)
AST: 11 U/L (ref 10–35)
Albumin: 3.6 g/dL (ref 3.6–5.1)
Alkaline phosphatase (APISO): 71 U/L (ref 37–153)
BUN: 16 mg/dL (ref 7–25)
CO2: 29 mmol/L (ref 20–32)
Calcium: 9.6 mg/dL (ref 8.6–10.4)
Chloride: 103 mmol/L (ref 98–110)
Creat: 0.88 mg/dL (ref 0.60–0.93)
GFR, Est African American: 73 mL/min/{1.73_m2} (ref >=60)
GFR, Est Non African American: 63 mL/min/{1.73_m2} (ref >=60)
Globulin: 3.2 g/dL (calc) (ref 1.9–3.7)
Glucose, Bld: 135 mg/dL — ABNORMAL HIGH (ref 65–99)
Potassium: 4.6 mmol/L (ref 3.5–5.3)
Sodium: 142 mmol/L (ref 135–146)
Total Bilirubin: 0.4 mg/dL (ref 0.2–1.2)
Total Protein: 6.8 g/dL (ref 6.1–8.1)

## 2019-11-06 LAB — TSH: TSH: 0.75 mIU/L (ref 0.40–4.50)

## 2019-11-06 LAB — LIPID PANEL
Cholesterol: 120 mg/dL (ref <200)
HDL: 43 mg/dL — ABNORMAL LOW (ref >=50)
LDL Cholesterol (Calc): 62 mg/dL (calc)
Non-HDL Cholesterol (Calc): 77 mg/dL (calc) (ref <130)
Total CHOL/HDL Ratio: 2.8 (calc) (ref <5.0)
Triglycerides: 66 mg/dL (ref <150)

## 2019-11-19 ENCOUNTER — Encounter: Payer: Self-pay | Admitting: Podiatry

## 2019-11-19 ENCOUNTER — Ambulatory Visit: Payer: Medicare Other | Admitting: Podiatry

## 2019-11-19 ENCOUNTER — Other Ambulatory Visit: Payer: Self-pay

## 2019-11-19 VITALS — BP 157/80 | HR 91

## 2019-11-19 DIAGNOSIS — B351 Tinea unguium: Secondary | ICD-10-CM

## 2019-11-19 DIAGNOSIS — M2042 Other hammer toe(s) (acquired), left foot: Secondary | ICD-10-CM

## 2019-11-19 DIAGNOSIS — M2011 Hallux valgus (acquired), right foot: Secondary | ICD-10-CM | POA: Diagnosis not present

## 2019-11-19 DIAGNOSIS — M2041 Other hammer toe(s) (acquired), right foot: Secondary | ICD-10-CM | POA: Diagnosis not present

## 2019-11-19 DIAGNOSIS — M79675 Pain in left toe(s): Secondary | ICD-10-CM | POA: Diagnosis not present

## 2019-11-19 DIAGNOSIS — M79674 Pain in right toe(s): Secondary | ICD-10-CM | POA: Diagnosis not present

## 2019-11-19 DIAGNOSIS — E119 Type 2 diabetes mellitus without complications: Secondary | ICD-10-CM

## 2019-11-19 DIAGNOSIS — M2012 Hallux valgus (acquired), left foot: Secondary | ICD-10-CM

## 2019-11-19 LAB — CUP PACEART REMOTE DEVICE CHECK
Battery Remaining Longevity: 128 mo
Battery Remaining Percentage: 95.5 %
Battery Voltage: 3.01 V
Brady Statistic AP VP Percent: 1 %
Brady Statistic AP VS Percent: 1 %
Brady Statistic AS VP Percent: 8.2 %
Brady Statistic AS VS Percent: 91 %
Brady Statistic RA Percent Paced: 1 %
Brady Statistic RV Percent Paced: 8.3 %
Date Time Interrogation Session: 20201124020013
Implantable Lead Implant Date: 20161129
Implantable Lead Implant Date: 20161129
Implantable Lead Location: 753859
Implantable Lead Location: 753860
Implantable Lead Model: 5076
Implantable Pulse Generator Implant Date: 20161129
Lead Channel Impedance Value: 360 Ohm
Lead Channel Impedance Value: 530 Ohm
Lead Channel Pacing Threshold Amplitude: 0.75 V
Lead Channel Pacing Threshold Amplitude: 0.75 V
Lead Channel Pacing Threshold Pulse Width: 0.4 ms
Lead Channel Pacing Threshold Pulse Width: 0.4 ms
Lead Channel Sensing Intrinsic Amplitude: 4.6 mV
Lead Channel Sensing Intrinsic Amplitude: 6.8 mV
Lead Channel Setting Pacing Amplitude: 1.75 V
Lead Channel Setting Pacing Amplitude: 2 V
Lead Channel Setting Pacing Pulse Width: 0.4 ms
Lead Channel Setting Sensing Sensitivity: 2 mV
Pulse Gen Model: 2240
Pulse Gen Serial Number: 7826037

## 2019-11-19 NOTE — Patient Instructions (Signed)
Diabetes Mellitus and Foot Care Foot care is an important part of your health, especially when you have diabetes. Diabetes may cause you to have problems because of poor blood flow (circulation) to your feet and legs, which can cause your skin to:  Become thinner and drier.  Break more easily.  Heal more slowly.  Peel and crack. You may also have nerve damage (neuropathy) in your legs and feet, causing decreased feeling in them. This means that you may not notice minor injuries to your feet that could lead to more serious problems. Noticing and addressing any potential problems early is the best way to prevent future foot problems. How to care for your feet Foot hygiene  Wash your feet daily with warm water and mild soap. Do not use hot water. Then, pat your feet and the areas between your toes until they are completely dry. Do not soak your feet as this can dry your skin.  Trim your toenails straight across. Do not dig under them or around the cuticle. File the edges of your nails with an emery board or nail file.  Apply a moisturizing lotion or petroleum jelly to the skin on your feet and to dry, brittle toenails. Use lotion that does not contain alcohol and is unscented. Do not apply lotion between your toes. Shoes and socks  Wear clean socks or stockings every day. Make sure they are not too tight. Do not wear knee-high stockings since they may decrease blood flow to your legs.  Wear shoes that fit properly and have enough cushioning. Always look in your shoes before you put them on to be sure there are no objects inside.  To break in new shoes, wear them for just a few hours a day. This prevents injuries on your feet. Wounds, scrapes, corns, and calluses  Check your feet daily for blisters, cuts, bruises, sores, and redness. If you cannot see the bottom of your feet, use a mirror or ask someone for help.  Do not cut corns or calluses or try to remove them with medicine.  If you  find a minor scrape, cut, or break in the skin on your feet, keep it and the skin around it clean and dry. You may clean these areas with mild soap and water. Do not clean the area with peroxide, alcohol, or iodine.  If you have a wound, scrape, corn, or callus on your foot, look at it several times a day to make sure it is healing and not infected. Check for: ? Redness, swelling, or pain. ? Fluid or blood. ? Warmth. ? Pus or a bad smell. General instructions  Do not cross your legs. This may decrease blood flow to your feet.  Do not use heating pads or hot water bottles on your feet. They may burn your skin. If you have lost feeling in your feet or legs, you may not know this is happening until it is too late.  Protect your feet from hot and cold by wearing shoes, such as at the beach or on hot pavement.  Schedule a complete foot exam at least once a year (annually) or more often if you have foot problems. If you have foot problems, report any cuts, sores, or bruises to your health care provider immediately. Contact a health care provider if:  You have a medical condition that increases your risk of infection and you have any cuts, sores, or bruises on your feet.  You have an injury that is not   healing.  You have redness on your legs or feet.  You feel burning or tingling in your legs or feet.  You have pain or cramps in your legs and feet.  Your legs or feet are numb.  Your feet always feel cold.  You have pain around a toenail. Get help right away if:  You have a wound, scrape, corn, or callus on your foot and: ? You have pain, swelling, or redness that gets worse. ? You have fluid or blood coming from the wound, scrape, corn, or callus. ? Your wound, scrape, corn, or callus feels warm to the touch. ? You have pus or a bad smell coming from the wound, scrape, corn, or callus. ? You have a fever. ? You have a red line going up your leg. Summary  Check your feet every day  for cuts, sores, red spots, swelling, and blisters.  Moisturize feet and legs daily.  Wear shoes that fit properly and have enough cushioning.  If you have foot problems, report any cuts, sores, or bruises to your health care provider immediately.  Schedule a complete foot exam at least once a year (annually) or more often if you have foot problems. This information is not intended to replace advice given to you by your health care provider. Make sure you discuss any questions you have with your health care provider. Document Released: 12/09/2000 Document Revised: 01/24/2018 Document Reviewed: 01/13/2017 Elsevier Patient Education  2020 Elsevier Inc.  

## 2019-11-20 ENCOUNTER — Ambulatory Visit (INDEPENDENT_AMBULATORY_CARE_PROVIDER_SITE_OTHER): Payer: Medicare Other | Admitting: *Deleted

## 2019-11-20 DIAGNOSIS — I442 Atrioventricular block, complete: Secondary | ICD-10-CM

## 2019-11-23 NOTE — Progress Notes (Signed)
Subjective: Natalie Castro presents today referred by Maryruth Hancock, MD for diabetic foot evaluation.  Patient relates 15-20 year history of diabetes.  Patient denies any history of foot wounds.  Patient denies any history of tingling, burning, and pins/needles sensations.  She does have numbness on occasion, but it does not keep her up at night.   Today, patient c/o of painful, discolored, thick toenails which interfere with daily activities.  Pain is aggravated when wearing enclosed shoe gear.   Past Medical History:  Diagnosis Date  . Acid reflux   . Arthritis   . Colon adenomas    AGE 78  . Complete heart block (HCC)    STJ PPM Dr. [name] 11/24/15  . Essential hypertension   . Hyperlipidemia   . Presence of permanent cardiac pacemaker   . Type 2 diabetes mellitus [facility])     Patient Active Problem List   Diagnosis Date Noted  . Hyperlipidemia 11/04/2019  . Need for immunization against influenza 11/04/2019  . Positive FIT (fecal immunochemical test) 04/26/2018  . CAP (community acquired pneumonia) 02/22/2017  . 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 Elevated troponin I level 11/04/2016  . Complete heart block ([place]) 11/23/2015  . Controlled type 2 diabetes mellitus without complication, without long-term current use of insulin ([place]) 07/20/2009  . KNEE, ARTHRITIS, 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  . KNEE PAIN 07/20/2009  . Essential hypertension 07/20/2009    Past Surgical History:  Procedure Laterality Date  . COLONOSCOPY     3 SIMPLE ADENOMAS, AGE 78  . COLONOSCOPY N/A 05/24/2018   Procedure: COLONOSCOPY;  Surgeon: Danie Binder, MD;  [facility];  Service: Endoscopy;  Laterality: N/A;  1:00pm  . EP IMPLANTABLE DEVICE N/A 11/24/2015   Procedure: Pacemaker Implant;  Surgeon: [name], MD;  Location: [place] CV LAB;  Service: Cardiovascular;  Laterality: N/A;  . JOINT REPLACEMENT     knees bilat.  Marland Kitchen POLYPECTOMY  05/24/2018   Procedure: POLYPECTOMY;   Surgeon: Danie Binder, MD;  [facility];  Service: Endoscopy;;  ascending and hepatic flexure, transverse  . TUBAL LIGATION      Current Outpatient Medications on File Prior to Visit  Medication Sig Dispense Refill  . apixaban (ELIQUIS) 5 MG TABS tablet Take 5 mg by mouth 2 (two) times daily.    Marland Kitchen atorvastatin (LIPITOR) 40 MG tablet Take 40 mg by mouth at bedtime.     Marland Kitchen glucose blood test strip Use to check blood glucose fasting , before lunch and dinner and after your largest meal 120 each 12  . hydrALAZINE (APRESOLINE) 25 MG tablet Take 25 mg by mouth 2 (two) times daily.    . Lancets (ONETOUCH ULTRASOFT) lancets Use to check glucose 4 x daily 120 each 12  . LANTUS SOLOSTAR 100 UNIT/ML Solostar Pen Inject 30 Units into the skin at bedtime.     Marland Kitchen losartan (COZAAR) 100 MG tablet Take 100 mg by mouth every morning.     . magnesium gluconate (MAGONATE) 500 MG tablet Take 1 tablet (500 mg total) by mouth daily. 90 tablet 3  . Magnesium Oxide 500 MG TABS TAKE 1 TABLET BY MOUTH ONCE A DAY. 30 tablet 11  . metFORMIN (GLUCOPHAGE) 1000 MG tablet Take 1,000 mg by mouth 2 (two) times daily with a meal.    . NIFEdipine (PROCARDIA-XL/ADALAT CC) 30 MG 24 hr tablet Take 30 mg by mouth daily.    Marland Kitchen NIFEdipine (PROCARDIA-XL/NIFEDICAL-XL) 30 MG 24 hr tablet Take  30 mg by mouth daily.    . ranitidine (ZANTAC) 300 MG tablet Take 300 mg by mouth every morning.      No current facility-administered medications on file prior to visit.      Allergies  Allergen Reactions  . Penicillins Hives, Itching and Other (See Comments)    Has patient had a PCN reaction causing immediate rash, facial/tongue/throat swelling, SOB or lightheadedness with hypotension: No Has patient had a PCN reaction causing severe rash involving mucus membranes or skin necrosis: Yes Has patient had a PCN reaction that required hospitalization No Has patient had a PCN reaction occurring within the last 10 years: No If all of  the above answers are "NO", then may proceed with Cephalosporin use.     Social History   Occupational History  . Occupation: retired  Tobacco Use  . Smoking status: Never Smoker  . Smokeless tobacco: Never Used  Substance and Sexual Activity  . Alcohol use: No    Alcohol/week: 0.0 standard drinks  . Drug use: No  . Sexual activity: Yes    Birth control/protection: Post-menopausal    Family History  Problem Relation Age of Onset  . Heart failure Mother   . Hypertension Mother   . Heart failure Sister   . Hypertension Father   . Cancer Sister   . Dementia Sister   . Colon cancer Neg Hx   . Colon polyps Neg Hx     Immunization History  Administered Date(s) Administered  . Fluad Quad(high Dose 65+) 11/04/2019  . Influenza, Seasonal, Injecte, Preservative Fre 09/25/2017  . Pneumococcal Polysaccharide-23 02/24/2017    Review of systems: Positive Findings in bold print.  Constitutional:  chills, fatigue, fever, sweats, weight change Communication: Optometrist, sign Ecologist, hand writing, iPad/Android device Head: headaches, head injury Eyes: changes in vision, eye pain, glaucoma, cataracts, macular degeneration, diplopia, glare,  light sensitivity, eyeglasses or contacts, blindness Ears nose mouth throat: hearing impaired, hearing aids,  ringing in ears, deaf, sign language,  vertigo, nosebleeds,  rhinitis,  cold sores, snoring, swollen glands Cardiovascular: HTN, edema, arrhythmia, pacemaker in place, defibrillator in place, chest pain/tightness, chronic anticoagulation, blood clot, heart failure, MI Peripheral Vascular: leg cramps, varicose veins, blood clots, lymphedema, varicosities Respiratory:  difficulty breathing, denies congestion, SOB, wheezing, cough, emphysema Gastrointestinal: change in appetite or weight, abdominal pain, constipation, diarrhea, nausea, vomiting, vomiting blood, change in bowel habits, abdominal pain, jaundice, rectal bleeding,  hemorrhoids, GERD Genitourinary:  nocturia,  pain on urination, polyuria,  blood in urine, Foley catheter, urinary urgency, ESRD on hemodialysis Musculoskeletal: amputation, cramping, stiff joints, painful joints, decreased joint motion, fractures, OA, gout, hemiplegia, paraplegia, uses cane, wheelchair bound, uses walker, uses rollator Skin: +changes in toenails, color change, dryness, itching, mole changes,  rash, wound(s) Neurological: headaches, numbness in feet, paresthesias in feet, burning in feet, fainting,  seizures, change in speech,  headaches, memory problems/poor historian, cerebral palsy, weakness, paralysis, CVA, TIA Endocrine: diabetes, hypothyroidism, hyperthyroidism,  goiter, dry mouth, flushing, heat intolerance,  cold intolerance,  excessive thirst, denies polyuria,  nocturia Hematological:  easy bleeding, excessive bleeding, easy bruising, enlarged lymph nodes, on long term blood thinner, history of past transusions Allergy/immunological:  hives, eczema, frequent infections, multiple drug allergies, seasonal allergies, transplant recipient, multiple food allergies Psychiatric:  anxiety, depression, mood disorder, suicidal ideations, hallucinations, insomnia  Objective: Vitals:   11/19/19 1054  BP: (!) 157/80  Pulse: 91   Vascular Examination: Capillary refill time <3 seconds b/l.   Dorsalis pedis pulses faintly palpable b/l.  Posterior tibial pulses faintly palpable b/l.  Digital hair absent x 10 digits  Skin temperature gradient WNL b/l.  Dermatological Examination: Skin with normal turgor, texture and tone b/l.  Toenails 1-5 b/l discolored, thick, dystrophic with subungual debris and pain with palpation to nailbeds due to thickness of nails.  Musculoskeletal: Muscle strength 5/5 to all LE muscle groups b/l.   HAV with bunion b/l.   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  Neurological: Sensation intact 5/5 with 10 gram monofilament b/l.   Vibratory sensation intact  b/l.  Assessment: 1. Painful onychomycosis toenails 1-5 b/l  2. HAV with bunion b/l 3. 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 NIDDM  Plan: 1. Discussed diabetic foot care principles. Literature dispensed on today. 2. Toenails 1-5 b/l were debrided in length and girth without iatrogenic bleeding. 3. Patient to continue soft, supportive shoe gear b/l.  4. Patient to report any pedal injuries to medical professional immediately. 5. Follow up 3 months.  6. Patient/POA to call should there be a concern in the interim.

## 2019-12-17 NOTE — Progress Notes (Signed)
PPM remote 

## 2019-12-30 ENCOUNTER — Telehealth: Payer: Self-pay | Admitting: Internal Medicine

## 2019-12-30 NOTE — Telephone Encounter (Signed)
Returned call to Pt.  Call went to VM.  Unable to leave a message.  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 Appears + Eliquis due to PE according to chart.  Cindy   Pt is not on any rate controlling medication per review of her chart.  Dr. Lovena Le will be in [facility] 12/31/2019. Pt is a [employer].  Will forward to [place] office to follow up.

## 2019-12-30 NOTE — Telephone Encounter (Signed)
Patient c/o Palpitations:  High priority if patient c/o lightheadedness, shortness of breath, or chest pain  1) How long have you had palpitations/irregular HR/ Afib? Are you having the symptoms now? Believes it is palpitations.  2) Are you currently experiencing lightheadedness, SOB or CP? No, "i'm feeling alright"  3) Do you have a history of afib (atrial fibrillation) or irregular heart rhythm? yes  4) Have you checked your BP or HR? (document readings if available): no, not today  5) Are you experiencing any other symptoms? Stomach feels up and down  Patient has a pacemaker.

## 2019-12-31 MED ORDER — METOPROLOL SUCCINATE ER 25 MG PO TB24: 25.0000 mg | ORAL_TABLET | Freq: Every day | ORAL | 1 refills | Status: DC

## 2019-12-31 NOTE — Telephone Encounter (Signed)
Per Dr.taylor, patient should start Toprol XL 25 mg daily.I e-scribed to pharmacy.Pt's phone goes to full voice mail,unable to reach.

## 2020-01-01 NOTE — Telephone Encounter (Signed)
I spoke with patient and she will start Toprol

## 2020-02-08 ENCOUNTER — Ambulatory Visit: Payer: Medicare Other

## 2020-02-18 ENCOUNTER — Ambulatory Visit: Payer: Medicare Other | Admitting: Podiatry

## 2020-02-18 ENCOUNTER — Other Ambulatory Visit: Payer: Self-pay

## 2020-02-18 ENCOUNTER — Encounter: Payer: Self-pay | Admitting: Podiatry

## 2020-02-18 ENCOUNTER — Other Ambulatory Visit: Payer: Medicare Other | Admitting: Orthotics

## 2020-02-18 DIAGNOSIS — M79674 Pain in right toe(s): Secondary | ICD-10-CM | POA: Diagnosis not present

## 2020-02-18 DIAGNOSIS — B351 Tinea unguium: Secondary | ICD-10-CM | POA: Diagnosis not present

## 2020-02-18 DIAGNOSIS — M79675 Pain in left toe(s): Secondary | ICD-10-CM | POA: Diagnosis not present

## 2020-02-18 DIAGNOSIS — E119 Type 2 diabetes mellitus without complications: Secondary | ICD-10-CM

## 2020-02-18 NOTE — Patient Instructions (Signed)
Diabetes Mellitus and Foot Care Foot care is an important part of your health, especially when you have diabetes. Diabetes may cause you to have problems because of poor blood flow (circulation) to your feet and legs, which can cause your skin to:  Become thinner and drier.  Break more easily.  Heal more slowly.  Peel and crack. You may also have nerve damage (neuropathy) in your legs and feet, causing decreased feeling in them. This means that you may not notice minor injuries to your feet that could lead to more serious problems. Noticing and addressing any potential problems early is the best way to prevent future foot problems. How to care for your feet Foot hygiene  Wash your feet daily with warm water and mild soap. Do not use hot water. Then, pat your feet and the areas between your toes until they are completely dry. Do not soak your feet as this can dry your skin.  Trim your toenails straight across. Do not dig under them or around the cuticle. File the edges of your nails with an emery board or nail file.  Apply a moisturizing lotion or petroleum jelly to the skin on your feet and to dry, brittle toenails. Use lotion that does not contain alcohol and is unscented. Do not apply lotion between your toes. Shoes and socks  Wear clean socks or stockings every day. Make sure they are not too tight. Do not wear knee-high stockings since they may decrease blood flow to your legs.  Wear shoes that fit properly and have enough cushioning. Always look in your shoes before you put them on to be sure there are no objects inside.  To break in new shoes, wear them for just a few hours a day. This prevents injuries on your feet. Wounds, scrapes, corns, and calluses  Check your feet daily for blisters, cuts, bruises, sores, and redness. If you cannot see the bottom of your feet, use a mirror or ask someone for help.  Do not cut corns or calluses or try to remove them with medicine.  If you  find a minor scrape, cut, or break in the skin on your feet, keep it and the skin around it clean and dry. You may clean these areas with mild soap and water. Do not clean the area with peroxide, alcohol, or iodine.  If you have a wound, scrape, corn, or callus on your foot, look at it several times a day to make sure it is healing and not infected. Check for: ? Redness, swelling, or pain. ? Fluid or blood. ? Warmth. ? Pus or a bad smell. General instructions  Do not cross your legs. This may decrease blood flow to your feet.  Do not use heating pads or hot water bottles on your feet. They may burn your skin. If you have lost feeling in your feet or legs, you may not know this is happening until it is too late.  Protect your feet from hot and cold by wearing shoes, such as at the beach or on hot pavement.  Schedule a complete foot exam at least once a year (annually) or more often if you have foot problems. If you have foot problems, report any cuts, sores, or bruises to your health care provider immediately. Contact a health care provider if:  You have a medical condition that increases your risk of infection and you have any cuts, sores, or bruises on your feet.  You have an injury that is not   healing.  You have redness on your legs or feet.  You feel burning or tingling in your legs or feet.  You have pain or cramps in your legs and feet.  Your legs or feet are numb.  Your feet always feel cold.  You have pain around a toenail. Get help right away if:  You have a wound, scrape, corn, or callus on your foot and: ? You have pain, swelling, or redness that gets worse. ? You have fluid or blood coming from the wound, scrape, corn, or callus. ? Your wound, scrape, corn, or callus feels warm to the touch. ? You have pus or a bad smell coming from the wound, scrape, corn, or callus. ? You have a fever. ? You have a red line going up your leg. Summary  Check your feet every day  for cuts, sores, red spots, swelling, and blisters.  Moisturize feet and legs daily.  Wear shoes that fit properly and have enough cushioning.  If you have foot problems, report any cuts, sores, or bruises to your health care provider immediately.  Schedule a complete foot exam at least once a year (annually) or more often if you have foot problems. This information is not intended to replace advice given to you by your health care provider. Make sure you discuss any questions you have with your health care provider. Document Revised: 09/04/2019 Document Reviewed: 01/13/2017 Elsevier Patient Education  2020 Elsevier Inc.  

## 2020-02-18 NOTE — Progress Notes (Signed)
Subjective: Natalie Castro presents today for follow up of painful mycotic nails b/l that are difficult to trim. Pain interferes with ambulation. Aggravating factors include wearing enclosed shoe gear. Pain is relieved with periodic professional debridement.   Patient also has h/o diabetes. Her sister is accompanying her on today's visit. They voice no new pedal concerns on today's visit.  Allergies  Allergen Reactions  . Penicillins Hives, Itching and Other (See Comments)    Has patient had a PCN reaction causing immediate rash, facial/tongue/throat swelling, SOB or lightheadedness with hypotension: No Has patient had a PCN reaction causing severe rash involving mucus membranes or skin necrosis: Yes Has patient had a PCN reaction that required hospitalization No Has patient had a PCN reaction occurring within the last 10 years: No If all of the above answers are "NO", then may proceed with Cephalosporin use.      Objective: There were no vitals filed for this visit.  Vascular Examination:  Capillary fill time to digits <3s b/l, faintly palpable pedal pulses b/l, pedal hair absent b/l and skin temperature gradient within normal limits b/l  Dermatological Examination: Pedal skin with normal turgor, texture and tone bilaterally, no open wounds bilaterally, no interdigital macerations bilaterally and toenails 1-5 b/l elongated, dystrophic, thickened, crumbly with subungual debris  Musculoskeletal: Normal muscle strength 5/5 to all lower extremity muscle groups bilaterally, no pain crepitus or joint limitation noted with ROM b/l, bunion deformity noted b/l and hammertoes noted to the  L 5th toe and R 5th toe  Neurological: Protective sensation intact 5/5 intact bilaterally with 10g monofilament b/l and vibratory sensation intact b/l  Assessment: 1. Pain due to onychomycosis of toenails of both feet   2. Controlled type 2 diabetes mellitus without complication, without long-term current use  of insulin ([place])    Plan: -Continue diabetic foot care principles. Literature dispensed on today.  -Toenails 1-5 b/l were debrided in length and girth with sterile nail nippers and dremel without iatrogenic bleeding. -Patient to continue soft, supportive shoe gear daily. -Patient to report any pedal injuries to medical professional immediately. -Patient/POA to call should there be question/concern in the interim.  Return in about 3 months (around 05/17/2020) for diabetic nail trim/ Eliquis.

## 2020-02-19 ENCOUNTER — Ambulatory Visit (INDEPENDENT_AMBULATORY_CARE_PROVIDER_SITE_OTHER): Payer: Medicare Other | Admitting: *Deleted

## 2020-02-19 ENCOUNTER — Telehealth: Payer: Self-pay | Admitting: Family Medicine

## 2020-02-19 DIAGNOSIS — I442 Atrioventricular block, complete: Secondary | ICD-10-CM

## 2020-02-19 LAB — CUP PACEART REMOTE DEVICE CHECK
Battery Remaining Longevity: 127 mo
Battery Remaining Percentage: 95.5 %
Battery Voltage: 3.01 V
Brady Statistic AP VP Percent: 1 %
Brady Statistic AP VS Percent: 1 %
Brady Statistic AS VP Percent: 11 %
Brady Statistic AS VS Percent: 88 %
Brady Statistic RA Percent Paced: 1 %
Brady Statistic RV Percent Paced: 11 %
Date Time Interrogation Session: 20210224020019
Implantable Lead Implant Date: 20161129
Implantable Lead Implant Date: 20161129
Implantable Lead Location: 753859
Implantable Lead Location: 753860
Implantable Lead Model: 5076
Implantable Pulse Generator Implant Date: 20161129
Lead Channel Impedance Value: 360 Ohm
Lead Channel Impedance Value: 490 Ohm
Lead Channel Pacing Threshold Amplitude: 0.75 V
Lead Channel Pacing Threshold Amplitude: 0.875 V
Lead Channel Pacing Threshold Pulse Width: 0.4 ms
Lead Channel Pacing Threshold Pulse Width: 0.4 ms
Lead Channel Sensing Intrinsic Amplitude: 4.2 mV
Lead Channel Sensing Intrinsic Amplitude: 7.4 mV
Lead Channel Setting Pacing Amplitude: 1.875
Lead Channel Setting Pacing Amplitude: 2 V
Lead Channel Setting Pacing Pulse Width: 0.4 ms
Lead Channel Setting Sensing Sensitivity: 2 mV
Pulse Gen Model: 2240
Pulse Gen Serial Number: 7826037

## 2020-02-19 NOTE — Telephone Encounter (Signed)
Spoke with pt, will proceed with vaccine

## 2020-02-19 NOTE — Telephone Encounter (Signed)
Patient is scheduled to get her covid vaccine next week and just wants the okay from Dr. Holly Bodily that this is a good decision for her

## 2020-02-20 NOTE — Progress Notes (Signed)
PPM Remote  

## 2020-02-25 ENCOUNTER — Ambulatory Visit: Payer: Medicare Other | Attending: Internal Medicine

## 2020-02-25 DIAGNOSIS — Z23 Encounter for immunization: Secondary | ICD-10-CM | POA: Insufficient documentation

## 2020-02-25 NOTE — Progress Notes (Signed)
   Covid-19 Vaccination Clinic  Name:  Natalie Castro    MRN: [id number] DOB: May 02, 1941  02/25/2020  [name] was observed post Covid-19 immunization for 15 minutes without incident. She was provided with Vaccine Information Sheet and instruction to access the V-Safe system.   [name] was instructed to call 911 with any severe reactions post vaccine: Marland Kitchen Difficulty breathing  . Swelling of face and throat  . A fast heartbeat  . A bad rash all over body  . Dizziness and weakness   Immunizations Administered    Name Date Dose VIS Date Route   Moderna COVID-19 Vaccine 02/25/2020  9:30 AM 0.5 mL 11/26/2019 Intramuscular   Manufacturer: Moderna   Lot: [id number]   [id number]

## 2020-03-04 ENCOUNTER — Other Ambulatory Visit: Payer: Self-pay | Admitting: Family Medicine

## 2020-03-04 ENCOUNTER — Other Ambulatory Visit: Payer: Self-pay | Admitting: Emergency Medicine

## 2020-03-04 NOTE — Telephone Encounter (Signed)
Patient wants refills on med you have not perscribed. You last seen her 11/04/19

## 2020-03-10 ENCOUNTER — Encounter: Payer: Self-pay | Admitting: Family Medicine

## 2020-03-10 ENCOUNTER — Other Ambulatory Visit: Payer: Self-pay

## 2020-03-10 ENCOUNTER — Ambulatory Visit (INDEPENDENT_AMBULATORY_CARE_PROVIDER_SITE_OTHER): Payer: Medicare Other | Admitting: Family Medicine

## 2020-03-10 VITALS — BP 162/70 | HR 80 | Temp 97.1°F | Ht 61.5 in | Wt 217.6 lb

## 2020-03-10 DIAGNOSIS — I10 Essential (primary) hypertension: Secondary | ICD-10-CM

## 2020-03-10 DIAGNOSIS — E119 Type 2 diabetes mellitus without complications: Secondary | ICD-10-CM

## 2020-03-10 DIAGNOSIS — Z794 Long term (current) use of insulin: Secondary | ICD-10-CM | POA: Diagnosis not present

## 2020-03-10 LAB — POCT GLYCOSYLATED HEMOGLOBIN (HGB A1C): Hemoglobin A1C: 7.7 % — AB (ref 4.0–5.6)

## 2020-03-10 NOTE — Patient Instructions (Addendum)
Check blood pressure at home-goal is 140/90 or LESS  Continue to check glucose readings   Keep appointment with [facility] tomorrow

## 2020-03-10 NOTE — Progress Notes (Signed)
Established Patient Office Visit  Subjective:  Patient ID: Natalie Castro, female    DOB: 1941-10-22  Age: 79 y.o. MRN: [id number]  CC:  Chief Complaint  Patient presents with  . Follow-up    3 Month f/u on diabetes. At noon today blood sugar was 109    HPI Natalie Castro presents for DM-glucose readings daily-pt did not bring to appointment.  Pt taking glucophage BID, Lantus 25units at night-seen by podiatry 2/21 foot care, A1c 10 in Nov 2020, Nutritional appt  In Nov with follow up this week  HTN-procardia 30mg , cozaar 100mg , hydralazine 25mg . Dr. Lovena Le recommended starting Toprol 25mg  at night-improved palpitations. Pt does not check bp at home. Pt states she does not have a monitor  Past Medical History:  Diagnosis Date  . Acid reflux   . Arthritis   . Colon adenomas    AGE 68  . Complete heart block (HCC)    STJ PPM Dr. [name] 11/24/15  . Essential hypertension   . Hyperlipidemia   . Presence of permanent cardiac pacemaker   . Type 2 diabetes mellitus ([place])     Past Surgical History:  Procedure Laterality Date  . COLONOSCOPY     3 SIMPLE ADENOMAS, AGE 24  . COLONOSCOPY N/A 05/24/2018   Procedure: COLONOSCOPY;  Surgeon: Danie Binder, MD;  Location: [facility];  Service: Endoscopy;  Laterality: N/A;  1:00pm  . EP IMPLANTABLE DEVICE N/A 11/24/2015   Procedure: Pacemaker Implant;  Surgeon: [name], MD;  Location: [facility];  Service: Cardiovascular;  Laterality: N/A;  . JOINT REPLACEMENT     knees bilat.  Marland Kitchen POLYPECTOMY  05/24/2018   Procedure: POLYPECTOMY;  Surgeon: Danie Binder, MD;  Location: [facility];  Service: Endoscopy;;  ascending and hepatic flexure, transverse  . TUBAL LIGATION      Family History  Problem Relation Age of Onset  . Heart failure Mother   . Hypertension Mother   . Heart failure Sister   . Hypertension Father   . Cancer Sister   . Dementia Sister   . Colon cancer Neg Hx   . Colon polyps Neg Hx     Social History    Socioeconomic History  . Marital status: Married    Spouse name: Not on file  . Number of children: Not on file  . Years of education: Not on file  . Highest education level: Not on file  Occupational History  . Occupation: retired  Tobacco Use  . Smoking status: Never Smoker  . Smokeless tobacco: Never Used  Substance and Sexual Activity  . Alcohol use: No    Alcohol/week: 0.0 standard drinks  . Drug use: No  . Sexual activity: Yes    Birth control/protection: Post-menopausal  Other Topics Concern  . Not on file  Social History Narrative   MARRIED FOR 57 YRS. 5 KIDS: #4 PRESENT TODAY(AGE 64)   Social Determinants of Health   Financial Resource Strain:   . Difficulty of Paying Living Expenses:   Food Insecurity:   . Worried About Charity fundraiser in the Last Year:   . Arboriculturist in the Last Year:   Transportation Needs:   . Film/video editor (Medical):   Marland Kitchen Lack of Transportation (Non-Medical):   Physical Activity:   . Days of Exercise per Week:   . Minutes of Exercise per Session:   Stress:   . Feeling of Stress :   Social Connections:   .  Frequency of Communication with Friends and Family:   . Frequency of Social Gatherings with Friends and Family:   . Attends Religious Services:   . Active Member of Clubs or Organizations:   . Attends Archivist Meetings:   Marland Kitchen Marital Status:   Intimate Partner Violence:   . Fear of Current or Ex-Partner:   . Emotionally Abused:   Marland Kitchen Physically Abused:   . Sexually Abused:     Outpatient Medications Prior to Visit  Medication Sig Dispense Refill  . apixaban (ELIQUIS) 5 MG TABS tablet Take 5 mg by mouth 2 (two) times daily.    Marland Kitchen atorvastatin (LIPITOR) 40 MG tablet Take 40 mg by mouth at bedtime.     Marland Kitchen glucose blood test strip Use to check blood glucose fasting , before lunch and dinner and after your largest meal 120 each 12  . hydrALAZINE (APRESOLINE) 25 MG tablet TAKE (1) TABLET BY MOUTH TWICE DAILY.  60 tablet 0  . Lancets (ONETOUCH ULTRASOFT) lancets Use to check glucose 4 x daily 120 each 12  . LANTUS SOLOSTAR 100 UNIT/ML Solostar Pen Inject 30 Units into the skin at bedtime.     Marland Kitchen losartan (COZAAR) 100 MG tablet TAKE (1) TABLET BY MOUTH ONCE DAILY. 30 tablet 0  . magnesium gluconate (MAGONATE) 500 MG tablet Take 1 tablet (500 mg total) by mouth daily. 90 tablet 3  . Magnesium Oxide 500 MG TABS TAKE 1 TABLET BY MOUTH ONCE A DAY. 30 tablet 11  . metFORMIN (GLUCOPHAGE) 1000 MG tablet Take 1,000 mg by mouth 2 (two) times daily with a meal.    . NIFEdipine (PROCARDIA-XL/NIFEDICAL-XL) 30 MG 24 hr tablet TAKE 1 TABLET BY MOUTH ONCE A DAY. 30 tablet 0  . ranitidine (ZANTAC) 300 MG tablet Take 300 mg by mouth every morning.      No facility-administered medications prior to visit.    Allergies  Allergen Reactions  . Penicillins Hives, Itching and Other (See Comments)    Has patient had a PCN reaction causing immediate rash, facial/tongue/throat swelling, SOB or lightheadedness with hypotension: No Has patient had a PCN reaction causing severe rash involving mucus membranes or skin necrosis: Yes Has patient had a PCN reaction that required hospitalization No Has patient had a PCN reaction occurring within the last 10 years: No If all of the above answers are "NO", then may proceed with Cephalosporin use.     ROS Review of Systems  Constitutional: Negative.   HENT: Negative.   Eyes:       No recent eye exam  Respiratory: Negative.   Cardiovascular: Negative for chest pain, palpitations and leg swelling.       Palpitations resolved with toprol  Endocrine:       DM-taking lantus + metformin  Genitourinary: Negative.   Musculoskeletal: Positive for gait problem.  Neurological: Positive for dizziness. Negative for headaches.      Objective:    Physical Exam  Constitutional: She is oriented to person, place, and time. She appears well-developed and well-nourished. No distress.   HENT:  Head: Normocephalic and atraumatic.  Eyes: Conjunctivae are normal.  Cardiovascular: Normal rate, regular rhythm, normal heart sounds and intact distal pulses.  Pulmonary/Chest: Effort normal and breath sounds normal.  Musculoskeletal:        General: No edema.     Cervical back: Normal range of motion and neck supple.  Neurological: She is oriented to person, place, and time.  Psychiatric: She has a normal mood and affect.  Her behavior is normal.    BP (!) 162/70 (BP Location: Right Arm, Patient Position: Sitting, Cuff Size: Large)   Pulse 80   Temp (!) 97.1 F (36.2 C) (Temporal)   Ht 5' 1.5" (1.562 m)   Wt 217 lb 9.6 oz (98.7 kg)   SpO2 96%   BMI 40.45 kg/m  Wt Readings from Last 3 Encounters:  03/10/20 217 lb 9.6 oz (98.7 kg)  11/04/19 220 lb (99.8 kg)  11/04/19 220 lb 12.8 oz (100.2 kg)     Health Maintenance Due  Topic Date Due  . PNA vac Low Risk Adult (2 of 2 - PCV13) 02/24/2018     Lab Results  Component Value Date   TSH 0.75 11/06/2019   Lab Results  Component Value Date   WBC 6.9 11/01/2018   HGB 12.5 11/01/2018   HCT 41.1 11/01/2018   MCV 90.3 11/01/2018   PLT 235 11/01/2018   Lab Results  Component Value Date   NA 142 11/06/2019   K 4.6 11/06/2019   CO2 29 11/06/2019   GLUCOSE 135 (H) 11/06/2019   BUN 16 11/06/2019   CREATININE 0.88 11/06/2019   BILITOT 0.4 11/06/2019   ALKPHOS 57 02/22/2017   AST 11 11/06/2019   ALT 7 11/06/2019   PROT 6.8 11/06/2019   ALBUMIN 3.1 (L) 02/22/2017   CALCIUM 9.6 11/06/2019   ANIONGAP 8 11/01/2018   Lab Results  Component Value Date   CHOL 120 11/06/2019   Lab Results  Component Value Date   HDL 43 (L) 11/06/2019   Lab Results  Component Value Date   LDLCALC 62 11/06/2019   Lab Results  Component Value Date   TRIG 66 11/06/2019   Lab Results  Component Value Date   CHOLHDL 2.8 11/06/2019   Lab Results  Component Value Date   HGBA1C 10.0 (A) 11/04/2019      Assessment & Plan:    1. Controlled type 2 diabetes mellitus without complication, with long-term current use of insulin (HCC) Pt using Lantus 25units +metformin-A1c improvement with dietary changes-7.7%(from 10)pt to bring glucose readings to dietitian appointment tomorrow. D/w pt need to reach goal of 7.0%-pt declines additional medication and wishes to focus on additional lifestyle changes to reach goal.  Morning glucose readings 109-agree with holding at 25units Lantus.. pt agreed to see Dr. Dorris Fetch for DM - POCT glycosylated hemoglobin (Hb A1C)  2. Essential hypertension Concern for elevation-d/w pt -toprol 25mg  added by cardiology for palpitations and this has improved symptoms. Pt will obtain a bp cuff and check blood pressure.  Pt understands concern for elevated bp-sees cardiology -taking procardia/cozaar/-now toprol. Pt will call with bp readings-concern for need to adjust dosage of procaria Follow-up: endocrinology, diabetic education   Ziza Hastings Hannah Beat, MD

## 2020-03-11 ENCOUNTER — Encounter: Payer: Medicare Other | Attending: Family Medicine | Admitting: Nutrition

## 2020-03-11 ENCOUNTER — Encounter: Payer: Self-pay | Admitting: Nutrition

## 2020-03-11 VITALS — BP 127/71 | Wt 219.0 lb

## 2020-03-11 DIAGNOSIS — E782 Mixed hyperlipidemia: Secondary | ICD-10-CM | POA: Diagnosis not present

## 2020-03-11 DIAGNOSIS — E118 Type 2 diabetes mellitus with unspecified complications: Secondary | ICD-10-CM | POA: Insufficient documentation

## 2020-03-11 DIAGNOSIS — E66813 Obesity, class 3: Secondary | ICD-10-CM

## 2020-03-11 DIAGNOSIS — I10 Essential (primary) hypertension: Secondary | ICD-10-CM

## 2020-03-11 DIAGNOSIS — E1165 Type 2 diabetes mellitus with hyperglycemia: Secondary | ICD-10-CM

## 2020-03-11 DIAGNOSIS — IMO0002 Reserved for concepts with insufficient information to code with codable children: Secondary | ICD-10-CM

## 2020-03-11 NOTE — Patient Instructions (Addendum)
  Goals  Follow My Plate Drink only water Keep eating meals on time. Don't skip breakfast. Get A1C to 7%. Test blood sugars twice a day. Take 30 units of Lantus at night.

## 2020-03-11 NOTE — Progress Notes (Signed)
  Medical Nutrition Therapy:  Appt start time: 1100 end time:  1130.   Assessment:  Primary concerns today: Diabetes Type 2, Obesity, HTN. . A1C 7.7%. Has been working on eating better foods. Has cut out sweets and sodas. Drinking more water. Tries to eat larger meal at lunch time  Instead of bedtime.. She is doing well testing blood sugars now. BS readings: 7 day 156 mg/dl 14 day 162  Mg/dl 30 day 157 mg/dl. Feeling better.  Saw Dr. Holly Bodily yesterday. BP today was 121/71 mg/dl. Avoiding processed foods.  Lantus 30 units, Metformin 1000 mg BID.  Scheduled to see DR. Nida, Endocrinologist next week for her DM Mgt.  Preferred Learning Style:   No preference indicated   Learning Readiness:   Ready  Change in progress   MEDICATIONS:   DIETARY INTAKE:      B)Egg, oatmeal L) meat and vegetables, water D) Meat, vegetabes, fruit, ater  Usual physical activity: ADL  Estimated energy needs: 1200 calories 135 g carbohydrates 90 g protein 33 g fat  Progress Towards Goal(s):  In progress.   Nutritional Diagnosis:  NB-1.1 Food and nutrition-related knowledge deficit As related to Diabetes Type 2, Obesity.  As evidenced by A1C 7.8% and BMI > 40.    Intervention:  Nutrition and Diabetes education provided on My Plate, CHO counting, meal planning, portion sizes, timing of meals, avoiding snacks between meals unless having a low blood sugar, target ranges for A1C and blood sugars, signs/symptoms and treatment of hyper/hypoglycemia, monitoring blood sugars, taking medications as prescribed, benefits of exercising 30 minutes per day and prevention of complications of DM. Marland Kitchen  Goals  Follow My Plate Drink only water Keep eating meals on time. Don't skip breakfast. Get A1C to 7%. Test blood sugars twice a day. Take 30 units of Lantus at night.   Teaching Method Utilized:  Visual Auditory Hands on  Handouts given during visit include:  The Plate Method   Diabetes  Instructions.  Barriers to learning/adherence to lifestyle change: none  Demonstrated degree of understanding via:  Teach Back   Monitoring/Evaluation:  Dietary intake, exercise, , and body weight in 1 month(s).

## 2020-03-24 ENCOUNTER — Ambulatory Visit: Payer: Medicare Other | Attending: Internal Medicine

## 2020-03-24 DIAGNOSIS — Z23 Encounter for immunization: Secondary | ICD-10-CM

## 2020-03-24 NOTE — Progress Notes (Signed)
Observed for 30 minutes.

## 2020-03-24 NOTE — Progress Notes (Signed)
   Covid-19 Vaccination Clinic  Name:  Natalie Castro    MRN: [id number] DOB: 1941-11-06  03/24/2020  [name] was observed post Covid-19 immunization for 15 minutes without incident. She was provided with Vaccine Information Sheet and instruction to access the V-Safe system.   [name] was instructed to call 911 with any severe reactions post vaccine: Marland Kitchen Difficulty breathing  . Swelling of face and throat  . A fast heartbeat  . A bad rash all over body  . Dizziness and weakness   Immunizations Administered    Name Date Dose VIS Date Route   Moderna COVID-19 Vaccine 03/24/2020  9:11 AM 0.5 mL 11/26/2019 Intramuscular   Manufacturer: Moderna   Lot: [id number]   [id number]

## 2020-04-03 ENCOUNTER — Other Ambulatory Visit: Payer: Self-pay | Admitting: Family Medicine

## 2020-04-06 ENCOUNTER — Other Ambulatory Visit: Payer: Self-pay

## 2020-04-06 ENCOUNTER — Encounter: Payer: Medicare Other | Attending: Family Medicine | Admitting: Nutrition

## 2020-04-06 ENCOUNTER — Encounter: Payer: Self-pay | Admitting: "Endocrinology

## 2020-04-06 ENCOUNTER — Ambulatory Visit (INDEPENDENT_AMBULATORY_CARE_PROVIDER_SITE_OTHER): Payer: Medicare Other | Admitting: "Endocrinology

## 2020-04-06 ENCOUNTER — Encounter: Payer: Self-pay | Admitting: Nutrition

## 2020-04-06 ENCOUNTER — Other Ambulatory Visit: Payer: Self-pay | Admitting: Family Medicine

## 2020-04-06 VITALS — BP 147/63 | HR 80 | Ht 61.5 in | Wt 215.0 lb

## 2020-04-06 DIAGNOSIS — E1165 Type 2 diabetes mellitus with hyperglycemia: Secondary | ICD-10-CM | POA: Insufficient documentation

## 2020-04-06 DIAGNOSIS — E782 Mixed hyperlipidemia: Secondary | ICD-10-CM | POA: Diagnosis not present

## 2020-04-06 DIAGNOSIS — I10 Essential (primary) hypertension: Secondary | ICD-10-CM | POA: Insufficient documentation

## 2020-04-06 NOTE — Progress Notes (Signed)
Endocrinology Consult Note       04/06/2020, 6:40 PM   Subjective:    Patient ID: Natalie Castro, female    DOB: [birth year].  Natalie Castro is being seen in consultation for management of currently uncontrolled symptomatic diabetes requested by  Maryruth Hancock, MD.   Past Medical History:  Diagnosis Date  . Acid reflux   . Arthritis   . Colon adenomas    AGE 79  . Complete heart block (HCC)    STJ PPM Dr. [name] 11/24/15  . Essential hypertension   . Hyperlipidemia   . Presence of permanent cardiac pacemaker   . Type 2 diabetes mellitus ([place])     Past Surgical History:  Procedure Laterality Date  . COLONOSCOPY     3 SIMPLE ADENOMAS, AGE 89  . COLONOSCOPY N/A 05/24/2018   Procedure: COLONOSCOPY;  Surgeon: Danie Binder, MD;  Location: [facility];  Service: Endoscopy;  Laterality: N/A;  1:00pm  . EP IMPLANTABLE DEVICE N/A 11/24/2015   Procedure: Pacemaker Implant;  Surgeon: [name], MD;  Location: [place] [facility];  Service: Cardiovascular;  Laterality: N/A;  . JOINT REPLACEMENT     knees bilat.  Marland Kitchen POLYPECTOMY  05/24/2018   Procedure: POLYPECTOMY;  Surgeon: Danie Binder, MD;  Location: [facility];  Service: Endoscopy;;  ascending and hepatic flexure, transverse  . TUBAL LIGATION      Social History   Socioeconomic History  . Marital status: Married    Spouse name: Not on file  . Number of children: Not on file  . Years of education: Not on file  . Highest education level: Not on file  Occupational History  . Occupation: retired  Tobacco Use  . Smoking status: Never Smoker  . Smokeless tobacco: Never Used  Substance and Sexual Activity  . Alcohol use: No    Alcohol/week: 0.0 standard drinks  . Drug use: No  . Sexual activity: Yes    Birth control/protection: Post-menopausal  Other Topics Concern  . Not on file  Social History Narrative   MARRIED FOR 58 YRS. 5 KIDS: #4  PRESENT TODAY(AGE 48)   Social Determinants of Health   Financial Resource Strain:   . Difficulty of Paying Living Expenses:   Food Insecurity:   . Worried About Charity fundraiser in the Last Year:   . Arboriculturist in the Last Year:   Transportation Needs:   . Film/video editor (Medical):   Marland Kitchen Lack of Transportation (Non-Medical):   Physical Activity:   . Days of Exercise per Week:   . Minutes of Exercise per Session:   Stress:   . Feeling of Stress :   Social Connections:   . Frequency of Communication with Friends and Family:   . Frequency of Social Gatherings with Friends and Family:   . Attends Religious Services:   . Active Member of Clubs or Organizations:   . Attends Archivist Meetings:   Marland Kitchen Marital Status:     Family History  Problem Relation Age of Onset  . Heart failure Mother   . Hypertension Mother   .  Heart failure Sister   . Hypertension Father   . Cancer Sister   . Dementia Sister   . Colon cancer Neg Hx   . Colon polyps Neg Hx     Outpatient Encounter Medications as of 04/06/2020  Medication Sig  . apixaban (ELIQUIS) 5 MG TABS tablet Take 5 mg by mouth 2 (two) times daily.  Marland Kitchen atorvastatin (LIPITOR) 40 MG tablet TAKE (1) TABLET BY MOUTH AT BEDTIME.  Marland Kitchen glucose blood test strip Use to check blood glucose fasting , before lunch and dinner and after your largest meal  . hydrALAZINE (APRESOLINE) 25 MG tablet TAKE (1) TABLET BY MOUTH TWICE DAILY.  Marland Kitchen Lancets (ONETOUCH ULTRASOFT) lancets Use to check glucose 4 x daily  . LANTUS SOLOSTAR 100 UNIT/ML Solostar Pen Inject 30 Units into the skin at bedtime.   Marland Kitchen losartan (COZAAR) 100 MG tablet TAKE (1) TABLET BY MOUTH ONCE DAILY.  . magnesium gluconate (MAGONATE) 500 MG tablet Take 1 tablet (500 mg total) by mouth daily.  . Magnesium Oxide 500 MG TABS TAKE 1 TABLET BY MOUTH ONCE A DAY.  . metFORMIN (GLUCOPHAGE) 1000 MG tablet TAKE (1) TABLET BY MOUTH TWICE DAILY.  Marland Kitchen NIFEdipine  (PROCARDIA-XL/NIFEDICAL-XL) 30 MG 24 hr tablet TAKE 1 TABLET BY MOUTH ONCE A DAY.  . [DISCONTINUED] atorvastatin (LIPITOR) 40 MG tablet Take 40 mg by mouth at bedtime.   . [DISCONTINUED] metFORMIN (GLUCOPHAGE) 1000 MG tablet Take 1,000 mg by mouth 2 (two) times daily with a meal.   No facility-administered encounter medications on file as of 04/06/2020.    ALLERGIES: Allergies  Allergen Reactions  . Penicillins Hives, Itching and Other (See Comments)    Has patient had a PCN reaction causing immediate rash, facial/tongue/throat swelling, SOB or lightheadedness with hypotension: No Has patient had a PCN reaction causing severe rash involving mucus membranes or skin necrosis: Yes Has patient had a PCN reaction that required hospitalization No Has patient had a PCN reaction occurring within the last 10 years: No If all of the above answers are "NO", then may proceed with Cephalosporin use.     VACCINATION STATUS: Immunization History  Administered Date(s) Administered  . Fluad Quad(high Dose 65+) 11/04/2019  . Influenza, Seasonal, Injecte, Preservative Fre 09/25/2017  . Influenza-Unspecified 10/26/2018  . Moderna SARS-COVID-2 Vaccination 02/25/2020, 03/24/2020  . Pneumococcal Polysaccharide-23 02/24/2017    Diabetes She presents for her initial diabetic visit. She has type 2 diabetes mellitus. Onset time: She was diagnosed at approximate age of 81 years. Her disease course has been improving. There are no hypoglycemic associated symptoms. There are no diabetic associated symptoms. There are no hypoglycemic complications. Symptoms are improving. Diabetic complications include heart disease. Pertinent negatives for diabetic complications include no nephropathy. Risk factors for coronary artery disease include tobacco exposure, obesity, hypertension, dyslipidemia, diabetes mellitus, post-menopausal and sedentary lifestyle. Current diabetic treatment includes insulin injections. She is  compliant with treatment most of the time. Her home blood glucose trend is decreasing steadily. (She did not bring any logs nor meter to review.  She denies hypoglycemia.  Her recent A1c was 7.7% improving from 10 percent.)  Hyperlipidemia  Hypertension     Review of Systems  Objective:    Vitals with BMI 04/06/2020 03/11/2020 03/10/2020  Height 5' 1.5" - -  Weight 215 lbs 219 lbs -  BMI [id number] -  Systolic [id number]  Diastolic 63 71 70  Pulse 80 - -    BP (!) 147/63   Pulse 80  Ht 5' 1.5" (1.562 m)   Wt 215 lb (97.5 kg)   BMI 39.97 kg/m   Wt Readings from Last 3 Encounters:  04/06/20 215 lb (97.5 kg)  03/11/20 219 lb (99.3 kg)  03/10/20 217 lb 9.6 oz (98.7 kg)     Physical Exam    CMP ( most recent) CMP     Component Value Date/Time   NA 142 11/06/2019 0810   K 4.6 11/06/2019 0810   CL 103 11/06/2019 0810   CO2 29 11/06/2019 0810   GLUCOSE 135 (H) 11/06/2019 0810   BUN 16 11/06/2019 0810   CREATININE 0.88 11/06/2019 0810   CALCIUM 9.6 11/06/2019 0810   PROT 6.8 11/06/2019 0810   ALBUMIN 3.1 (L) 02/22/2017 2141   AST 11 11/06/2019 0810   ALT 7 11/06/2019 0810   ALKPHOS 57 02/22/2017 2141   BILITOT 0.4 11/06/2019 0810   GFRNONAA 63 11/06/2019 0810   GFRAA 73 11/06/2019 0810     Diabetic Labs (most recent): Lab Results  Component Value Date   HGBA1C 7.7 (A) 03/10/2020   HGBA1C 10.0 (A) 11/04/2019   HGBA1C 7.4 (H) 11/22/2016    Lipid Panel     Component Value Date/Time   CHOL 120 11/06/2019 0810   TRIG 66 11/06/2019 0810   HDL 43 (L) 11/06/2019 0810   CHOLHDL 2.8 11/06/2019 0810   LDLCALC 62 11/06/2019 0810      Lab Results  Component Value Date   TSH 0.75 11/06/2019   TSH 0.573 11/01/2018   TSH 0.150 (L) 02/22/2017   TSH 0.713 11/23/2015      Assessment & Plan:   1. Uncontrolled type 2 diabetes mellitus with hyperglycemia (HCC)   - Diondria B Christine has currently uncontrolled symptomatic type 2 DM since  79 years of age. She  seems to have responded to recent adjustment of her treatment. She did not bring any logs nor meter to review.  He denies hypoglycemia.  Her recent A1c was 7.7% improving from 10+ percent.  - Recent labs reviewed. - I had a long discussion with her about the progressive nature of diabetes and the pathology behind its complications. -her diabetes is complicated by coronary artery disease, retinopathy, deconditioning/sedentary life, obesity and she remains at a high risk for more acute and chronic complications which include CAD, CVA, CKD, retinopathy, and neuropathy. These are all discussed in detail with her.  - I have counseled her on diet  and weight management  by adopting a carbohydrate restricted/protein rich diet. Patient is encouraged to switch to  unprocessed or minimally processed     complex starch and increased protein intake (animal or plant source), fruits, and vegetables. -  she is advised to stick to a routine mealtimes to eat 3 meals  a day and avoid unnecessary snacks ( to snack only to correct hypoglycemia).   - she admits that there is a room for improvement in her food and drink choices. - Suggestion is made for her to avoid simple carbohydrates  from her diet including Cakes, Sweet Desserts, Ice Cream, Soda (diet and regular), Sweet Tea, Candies, Chips, Cookies, Store Bought Juices, Alcohol in Excess of  1-2 drinks a day, Artificial Sweeteners,  Coffee Creamer, and "Sugar-free" Products. This will help patient to have more stable blood glucose profile and potentially avoid unintended weight gain.  - she will be scheduled with Jearld Fenton, RDN, CDE for diabetes education.  - I have approached her with the following individualized plan to manage  her diabetes and patient agrees:   - she will continue to need at least basal insulin in order for her to maintain control of diabetes to target.  She is advised to continue Lantus 30 units nightly, associated with monitoring of blood  glucose twice a day-daily before breakfast and at bedtime.   - she is warned not to take insulin without proper monitoring per orders.  - she is encouraged to call clinic for blood glucose levels less than 70 or above 300 mg /dl. - she is advised to continue Metformin 1000 mg p.o. twice daily, therapeutically suitable for patient .  - she will be considered for incretin therapy as appropriate next visit.  - Specific targets for  A1c;  LDL, HDL,  and Triglycerides were discussed with the patient.  2) Blood Pressure /Hypertension:  her blood pressure is not controlled to target.   she is advised to continue her current medications including Procardia XL/Nifedical XL  30mg  p.o. daily with breakfast .  She is also on Cozaar 100 mg p.o. daily.  3) Lipids/Hyperlipidemia:   Review of her recent lipid panel showed  controlled  LDL at 62 .  she  is advised to continue    atorvastatin 40 mg daily at bedtime.  Side effects and precautions discussed with her.  4)  Weight/Diet:  Body mass index is 39.97 kg/m.  -   clearly complicating her diabetes care.   she is  a candidate for weight loss. I discussed with her the fact that loss of 5 - 10% of her  current body weight will have the most impact on her diabetes management.  Exercise, and detailed carbohydrates information provided  -  detailed on discharge instructions.  5) Chronic Care/Health Maintenance:  -she  is on ACEI/ARB and Statin medications and  is encouraged to initiate and continue to follow up with Ophthalmology, Dentist,  Podiatrist at least yearly or according to recommendations, and advised to   stay away from smoking. I have recommended yearly flu vaccine and pneumonia vaccine at least every 5 years; moderate intensity exercise for up to 150 minutes weekly; and  sleep for at least 7 hours a day.  - she is  advised to maintain close follow up with Corum, Rex Kras, MD for primary care needs, as well as her other providers for optimal and  coordinated care.   - Time spent in this patient care: 60 min, of which > 50% was spent in  counseling  her about her currently uncontrolled, complicated type 2 diabetes; hyperlipidemia; hypertension and the rest reviewing her blood glucose logs , discussing her hypoglycemia and hyperglycemia episodes, reviewing her current and  previous labs / studies  ( including abstraction from other facilities) and medications  doses and developing a  long term treatment plan based on the latest standards of care/ guidelines; and documenting her care.    Please refer to Patient Instructions for Blood Glucose Monitoring and Insulin/Medications Dosing Guide"  in media tab for additional information. Please  also refer to " Patient Self Inventory" in the Media  tab for reviewed elements of pertinent patient history.  Yolonda Kida participated in the discussions, expressed understanding, and voiced agreement with the above plans.  All questions were answered to her satisfaction. she is encouraged to contact clinic should she have any questions or concerns prior to her return visit.   Follow up plan: - Return in about 3 months (around 07/06/2020) for Bring Meter and Logs- A1c in  Office, Follow up with Pre-visit Labs.  Glade Lloyd, MD [facility] [address] [place], [place] [address] Phone: [phone]  Fax: [phone]    04/06/2020, 6:40 PM  This note was partially dictated with voice recognition software. Similar sounding words can be transcribed inadequately or may not  be corrected upon review.

## 2020-04-06 NOTE — Progress Notes (Signed)
  Medical Nutrition Therapy:  Appt start time: 1330 end time:  [id number]  Assessment:  Primary concerns today: Diabetes Type 2, Obesity, HTN.A1C 7.7%. Lost 4 lbs.  Trying to  Avoid sweets. Eating more salad and vegetables. Drinking mostly water. Lantus 30 units, Metformin 1000 mg BID.  Using a walker. Not able to stand for long periods of time. Does some chair exercises at home. She is glad her A1C is better.   Preferred Learning Style:   No preference indicated   Learning Readiness:   Ready  Change in progress   MEDICATIONS:   DIETARY INTAKE:      B) 2 boiled eggs and 2 slices toast, water L) steak, creamed potatoes, app water D)   Usual physical activity: ADL  Estimated energy needs: 1200 calories 135 g carbohydrates 90 g protein 33 g fat  Progress Towards Goal(s):  In progress.   Nutritional Diagnosis:  NB-1.1 Food and nutrition-related knowledge deficit As related to Diabetes Type 2, Obesity.  As evidenced by A1C 7.8% and BMI > 40.    Intervention:  Nutrition and Diabetes education provided on My Plate, CHO counting, meal planning, portion sizes, timing of meals, avoiding snacks between meals unless having a low blood sugar, target ranges for A1C and blood sugars, signs/symptoms and treatment of hyper/hypoglycemia, monitoring blood sugars, taking medications as prescribed, benefits of exercising 30 minutes per day and prevention of complications of DM. Marland Kitchen  Goals Try to eat three meals per day Don't skip meals Keep drinking water Add more lower carb vegetables with lunch and dinner Lose 2-3 lbs per month.  Teaching Method Utilized:  Visual Auditory Hands on  Handouts given during visit include:  The Plate Method   Diabetes Instructions.  Barriers to learning/adherence to lifestyle change: none  Demonstrated degree of understanding via:  Teach Back   Monitoring/Evaluation:  Dietary intake, exercise, , and body weight in 3 month(s).

## 2020-04-06 NOTE — Patient Instructions (Signed)

## 2020-04-06 NOTE — Patient Instructions (Signed)
  Goals Try to eat three meals per day Don't skip meals Keep drinking water Add more lower carb vegetables with lunch and dinner Lose 2-3 lbs per month.

## 2020-05-04 ENCOUNTER — Other Ambulatory Visit: Payer: Self-pay | Admitting: Family Medicine

## 2020-05-20 ENCOUNTER — Ambulatory Visit: Payer: Medicare Other | Admitting: Podiatry

## 2020-05-20 ENCOUNTER — Encounter: Payer: Self-pay | Admitting: Podiatry

## 2020-05-20 ENCOUNTER — Other Ambulatory Visit: Payer: Self-pay

## 2020-05-20 ENCOUNTER — Ambulatory Visit (INDEPENDENT_AMBULATORY_CARE_PROVIDER_SITE_OTHER): Payer: Medicare Other | Admitting: *Deleted

## 2020-05-20 DIAGNOSIS — M2011 Hallux valgus (acquired), right foot: Secondary | ICD-10-CM

## 2020-05-20 DIAGNOSIS — E119 Type 2 diabetes mellitus without complications: Secondary | ICD-10-CM

## 2020-05-20 DIAGNOSIS — L84 Corns and callosities: Secondary | ICD-10-CM | POA: Diagnosis not present

## 2020-05-20 DIAGNOSIS — B351 Tinea unguium: Secondary | ICD-10-CM

## 2020-05-20 DIAGNOSIS — M79674 Pain in right toe(s): Secondary | ICD-10-CM

## 2020-05-20 DIAGNOSIS — M2042 Other hammer toe(s) (acquired), left foot: Secondary | ICD-10-CM

## 2020-05-20 DIAGNOSIS — M79675 Pain in left toe(s): Secondary | ICD-10-CM | POA: Diagnosis not present

## 2020-05-20 DIAGNOSIS — I442 Atrioventricular block, complete: Secondary | ICD-10-CM | POA: Diagnosis not present

## 2020-05-20 DIAGNOSIS — M2041 Other hammer toe(s) (acquired), right foot: Secondary | ICD-10-CM

## 2020-05-20 DIAGNOSIS — M2012 Hallux valgus (acquired), left foot: Secondary | ICD-10-CM

## 2020-05-20 LAB — CUP PACEART REMOTE DEVICE CHECK
Battery Remaining Longevity: 127 mo
Battery Remaining Percentage: 95.5 %
Battery Voltage: 2.99 V
Brady Statistic AP VP Percent: 1 %
Brady Statistic AP VS Percent: 1.1 %
Brady Statistic AS VP Percent: 11 %
Brady Statistic AS VS Percent: 88 %
Brady Statistic RA Percent Paced: 1.1 %
Brady Statistic RV Percent Paced: 11 %
Date Time Interrogation Session: 20210526032528
Implantable Lead Implant Date: 20161129
Implantable Lead Implant Date: 20161129
Implantable Lead Location: 753859
Implantable Lead Location: 753860
Implantable Lead Model: 5076
Implantable Pulse Generator Implant Date: 20161129
Lead Channel Impedance Value: 360 Ohm
Lead Channel Impedance Value: 530 Ohm
Lead Channel Pacing Threshold Amplitude: 0.75 V
Lead Channel Pacing Threshold Amplitude: 0.875 V
Lead Channel Pacing Threshold Pulse Width: 0.4 ms
Lead Channel Pacing Threshold Pulse Width: 0.4 ms
Lead Channel Sensing Intrinsic Amplitude: 4.2 mV
Lead Channel Sensing Intrinsic Amplitude: 7.8 mV
Lead Channel Setting Pacing Amplitude: 1.875
Lead Channel Setting Pacing Amplitude: 2 V
Lead Channel Setting Pacing Pulse Width: 0.4 ms
Lead Channel Setting Sensing Sensitivity: 2 mV
Pulse Gen Model: 2240
Pulse Gen Serial Number: 7826037

## 2020-05-20 NOTE — Patient Instructions (Signed)
Diabetes Mellitus and Foot Care Foot care is an important part of your health, especially when you have diabetes. Diabetes may cause you to have problems because of poor blood flow (circulation) to your feet and legs, which can cause your skin to:  Become thinner and drier.  Break more easily.  Heal more slowly.  Peel and crack. You may also have nerve damage (neuropathy) in your legs and feet, causing decreased feeling in them. This means that you may not notice minor injuries to your feet that could lead to more serious problems. Noticing and addressing any potential problems early is the best way to prevent future foot problems. How to care for your feet Foot hygiene  Wash your feet daily with warm water and mild soap. Do not use hot water. Then, pat your feet and the areas between your toes until they are completely dry. Do not soak your feet as this can dry your skin.  Trim your toenails straight across. Do not dig under them or around the cuticle. File the edges of your nails with an emery board or nail file.  Apply a moisturizing lotion or petroleum jelly to the skin on your feet and to dry, brittle toenails. Use lotion that does not contain alcohol and is unscented. Do not apply lotion between your toes. Shoes and socks  Wear clean socks or stockings every day. Make sure they are not too tight. Do not wear knee-high stockings since they may decrease blood flow to your legs.  Wear shoes that fit properly and have enough cushioning. Always look in your shoes before you put them on to be sure there are no objects inside.  To break in new shoes, wear them for just a few hours a day. This prevents injuries on your feet. Wounds, scrapes, corns, and calluses  Check your feet daily for blisters, cuts, bruises, sores, and redness. If you cannot see the bottom of your feet, use a mirror or ask someone for help.  Do not cut corns or calluses or try to remove them with medicine.  If you  find a minor scrape, cut, or break in the skin on your feet, keep it and the skin around it clean and dry. You may clean these areas with mild soap and water. Do not clean the area with peroxide, alcohol, or iodine.  If you have a wound, scrape, corn, or callus on your foot, look at it several times a day to make sure it is healing and not infected. Check for: ? Redness, swelling, or pain. ? Fluid or blood. ? Warmth. ? Pus or a bad smell. General instructions  Do not cross your legs. This may decrease blood flow to your feet.  Do not use heating pads or hot water bottles on your feet. They may burn your skin. If you have lost feeling in your feet or legs, you may not know this is happening until it is too late.  Protect your feet from hot and cold by wearing shoes, such as at the beach or on hot pavement.  Schedule a complete foot exam at least once a year (annually) or more often if you have foot problems. If you have foot problems, report any cuts, sores, or bruises to your health care provider immediately. Contact a health care provider if:  You have a medical condition that increases your risk of infection and you have any cuts, sores, or bruises on your feet.  You have an injury that is not   healing.  You have redness on your legs or feet.  You feel burning or tingling in your legs or feet.  You have pain or cramps in your legs and feet.  Your legs or feet are numb.  Your feet always feel cold.  You have pain around a toenail. Get help right away if:  You have a wound, scrape, corn, or callus on your foot and: ? You have pain, swelling, or redness that gets worse. ? You have fluid or blood coming from the wound, scrape, corn, or callus. ? Your wound, scrape, corn, or callus feels warm to the touch. ? You have pus or a bad smell coming from the wound, scrape, corn, or callus. ? You have a fever. ? You have a red line going up your leg. Summary  Check your feet every day  for cuts, sores, red spots, swelling, and blisters.  Moisturize feet and legs daily.  Wear shoes that fit properly and have enough cushioning.  If you have foot problems, report any cuts, sores, or bruises to your health care provider immediately.  Schedule a complete foot exam at least once a year (annually) or more often if you have foot problems. This information is not intended to replace advice given to you by your health care provider. Make sure you discuss any questions you have with your health care provider. Document Revised: 09/04/2019 Document Reviewed: 01/13/2017 Elsevier Patient Education  2020 Elsevier Inc.  

## 2020-05-20 NOTE — Progress Notes (Signed)
Remote pacemaker transmission.   

## 2020-05-28 ENCOUNTER — Other Ambulatory Visit: Payer: Self-pay | Admitting: Family Medicine

## 2020-05-28 NOTE — Progress Notes (Signed)
Subjective: Natalie Castro presents today preventative diabetic foot care and painful callus(es) right foot and painful mycotic toenails b/l that are difficult to trim. Pain interferes with ambulation. Aggravating factors include wearing enclosed shoe gear. Pain is relieved with periodic professional debridement.   She is inquiring about diabetic shoes on today's visit.  Corum, Rex Kras, MD is patient's PCP. Last visit was: 03/10/2020.  Past Medical History:  Diagnosis Date  . Acid reflux   . Arthritis   . Colon adenomas    AGE 10  . Complete heart block (HCC)    STJ PPM Dr. [name] 11/24/15  . Essential hypertension   . Hyperlipidemia   . Presence of permanent cardiac pacemaker   . Type 2 diabetes mellitus ([place])      Current Outpatient Medications on File Prior to Visit  Medication Sig Dispense Refill  . atorvastatin (LIPITOR) 40 MG tablet TAKE (1) TABLET BY MOUTH AT BEDTIME. 30 tablet 0  . ELIQUIS 5 MG TABS tablet TAKE 1 TABLET BY MOUTH TWICE A DAY. 60 tablet 0  . glucose blood test strip Use to check blood glucose fasting , before lunch and dinner and after your largest meal 120 each 12  . hydrALAZINE (APRESOLINE) 25 MG tablet TAKE (1) TABLET BY MOUTH TWICE DAILY. 60 tablet 0  . Lancets (ONETOUCH ULTRASOFT) lancets Use to check glucose 4 x daily 120 each 12  . LANTUS SOLOSTAR 100 UNIT/ML Solostar Pen Inject 30 Units into the skin at bedtime.     Marland Kitchen losartan (COZAAR) 100 MG tablet TAKE (1) TABLET BY MOUTH ONCE DAILY. 30 tablet 0  . magnesium gluconate (MAGONATE) 500 MG tablet Take 1 tablet (500 mg total) by mouth daily. 90 tablet 3  . Magnesium Oxide 500 MG TABS TAKE 1 TABLET BY MOUTH ONCE A DAY. 30 tablet 11  . metFORMIN (GLUCOPHAGE) 1000 MG tablet TAKE (1) TABLET BY MOUTH TWICE DAILY. 60 tablet 0  . metoprolol succinate (TOPROL-XL) 25 MG 24 hr tablet Take 25 mg by mouth daily.    Marland Kitchen NIFEdipine (PROCARDIA-XL/NIFEDICAL-XL) 30 MG 24 hr tablet TAKE 1 TABLET BY MOUTH ONCE A DAY. 30 tablet 0    No current facility-administered medications on file prior to visit.     Allergies  Allergen Reactions  . Penicillins Hives, Itching and Other (See Comments)    Has patient had a PCN reaction causing immediate rash, facial/tongue/throat swelling, SOB or lightheadedness with hypotension: No Has patient had a PCN reaction causing severe rash involving mucus membranes or skin necrosis: Yes Has patient had a PCN reaction that required hospitalization No Has patient had a PCN reaction occurring within the last 10 years: No If all of the above answers are "NO", then may proceed with Cephalosporin use.     Objective: [name] is a pleasant 79 y.o. y.o. Patient Race: Black or African American [2]  female in NAD. AAO x 3.  There were no vitals filed for this visit.  Vascular Examination: Neurovascular status unchanged b/l lower extremities. Capillary fill time to digits <3 seconds b/l lower extremities. Faintly palpable pedal pulses b/l. Pedal hair absent b/l Skin temperature gradient within normal limits b/l.  Dermatological Examination: Pedal skin with normal turgor, texture and tone bilaterally. No open wounds bilaterally. No interdigital macerations bilaterally. Toenails 1-5 b/l elongated, discolored, dystrophic, thickened, crumbly with subungual debris and tenderness to dorsal palpation. Hyperkeratotic lesion(s) R hallux.  No erythema, no edema, no drainage, no flocculence.  Musculoskeletal: Normal muscle strength 5/5 to all  lower extremity muscle groups bilaterally. No pain crepitus or joint limitation noted with ROM b/l. 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 Hammertoes noted to the L 5th toe and R 5th toe.  Neurological Examination: Protective sensation intact 5/5 intact bilaterally with 10g monofilament b/l. Vibratory sensation intact b/l. Proprioception intact bilaterally.  Assessment: 1. Pain due to onychomycosis of toenails of both feet   2.  Callus   3. Hallux valgus, acquired, bilateral   4. Acquired hammertoes of both feet   5. Controlled type 2 diabetes mellitus without complication, without long-term current use of insulin ([place])   Plan: -Examined patient. -Continue diabetic foot care principles. Literature dispensed on today.  -Toenails 1-5 b/l were debrided in length and girth with sterile nail nippers and dremel without iatrogenic bleeding.  -Patient to continue soft, supportive shoe gear daily. Start procedure for diabetic shoes. Patient qualifies based on diagnoses. -Patient to report any pedal injuries to medical professional immediately. -Patient/POA to call should there be question/concern in the interim.  Return in about 3 months (around 08/20/2020) for diabetic nail trim/ Eliquis.  Marzetta Board, DPM

## 2020-06-05 ENCOUNTER — Other Ambulatory Visit: Payer: Self-pay

## 2020-06-05 ENCOUNTER — Ambulatory Visit: Payer: Medicare Other | Admitting: Orthotics

## 2020-06-09 DIAGNOSIS — E782 Mixed hyperlipidemia: Secondary | ICD-10-CM | POA: Diagnosis not present

## 2020-06-09 DIAGNOSIS — Z86718 Personal history of other venous thrombosis and embolism: Secondary | ICD-10-CM | POA: Diagnosis not present

## 2020-06-09 DIAGNOSIS — Z0189 Encounter for other specified special examinations: Secondary | ICD-10-CM | POA: Diagnosis not present

## 2020-06-09 DIAGNOSIS — Z950 Presence of cardiac pacemaker: Secondary | ICD-10-CM | POA: Diagnosis not present

## 2020-06-09 DIAGNOSIS — E118 Type 2 diabetes mellitus with unspecified complications: Secondary | ICD-10-CM | POA: Diagnosis not present

## 2020-06-11 ENCOUNTER — Other Ambulatory Visit: Payer: Self-pay | Admitting: Family Medicine

## 2020-06-23 DIAGNOSIS — I10 Essential (primary) hypertension: Secondary | ICD-10-CM | POA: Diagnosis not present

## 2020-06-23 DIAGNOSIS — E785 Hyperlipidemia, unspecified: Secondary | ICD-10-CM | POA: Diagnosis not present

## 2020-06-23 DIAGNOSIS — Z0000 Encounter for general adult medical examination without abnormal findings: Secondary | ICD-10-CM | POA: Diagnosis not present

## 2020-06-23 DIAGNOSIS — E1169 Type 2 diabetes mellitus with other specified complication: Secondary | ICD-10-CM | POA: Diagnosis not present

## 2020-06-30 DIAGNOSIS — Z950 Presence of cardiac pacemaker: Secondary | ICD-10-CM | POA: Diagnosis not present

## 2020-06-30 DIAGNOSIS — E782 Mixed hyperlipidemia: Secondary | ICD-10-CM | POA: Diagnosis not present

## 2020-06-30 DIAGNOSIS — E118 Type 2 diabetes mellitus with unspecified complications: Secondary | ICD-10-CM | POA: Diagnosis not present

## 2020-06-30 DIAGNOSIS — E1165 Type 2 diabetes mellitus with hyperglycemia: Secondary | ICD-10-CM | POA: Diagnosis not present

## 2020-06-30 DIAGNOSIS — Z86718 Personal history of other venous thrombosis and embolism: Secondary | ICD-10-CM | POA: Diagnosis not present

## 2020-07-01 LAB — COMPLETE METABOLIC PANEL WITH GFR
AG Ratio: 1.1 (calc) (ref 1.0–2.5)
ALT: 8 U/L (ref 6–29)
AST: 10 U/L (ref 10–35)
Albumin: 3.5 g/dL — ABNORMAL LOW (ref 3.6–5.1)
Alkaline phosphatase (APISO): 85 U/L (ref 37–153)
BUN: 14 mg/dL (ref 7–25)
CO2: 27 mmol/L (ref 20–32)
Calcium: 8.6 mg/dL (ref 8.6–10.4)
Chloride: 105 mmol/L (ref 98–110)
Creat: 0.73 mg/dL (ref 0.60–0.93)
GFR, Est African American: 91 mL/min/{1.73_m2} (ref >=60)
GFR, Est Non African American: 79 mL/min/{1.73_m2} (ref >=60)
Globulin: 3.2 g/dL (calc) (ref 1.9–3.7)
Glucose, Bld: 166 mg/dL — ABNORMAL HIGH (ref 65–99)
Potassium: 4.1 mmol/L (ref 3.5–5.3)
Sodium: 140 mmol/L (ref 135–146)
Total Bilirubin: 0.2 mg/dL (ref 0.2–1.2)
Total Protein: 6.7 g/dL (ref 6.1–8.1)

## 2020-07-01 LAB — MICROALBUMIN / CREATININE URINE RATIO
Creatinine, Urine: 191 mg/dL (ref 20–275)
Microalb Creat Ratio: 6 ug/mg{creat}
Microalb, Ur: 1.1 mg/dL

## 2020-07-01 LAB — TSH: TSH: 0.88 m[IU]/L (ref 0.40–4.50)

## 2020-07-01 LAB — T4, FREE: Free T4: 1.2 ng/dL (ref 0.8–1.8)

## 2020-07-06 ENCOUNTER — Encounter: Payer: Self-pay | Admitting: "Endocrinology

## 2020-07-06 ENCOUNTER — Ambulatory Visit (INDEPENDENT_AMBULATORY_CARE_PROVIDER_SITE_OTHER): Payer: Medicare Other | Admitting: "Endocrinology

## 2020-07-06 ENCOUNTER — Encounter: Payer: Self-pay | Admitting: Nutrition

## 2020-07-06 ENCOUNTER — Encounter: Payer: Medicare Other | Attending: "Endocrinology | Admitting: Nutrition

## 2020-07-06 ENCOUNTER — Other Ambulatory Visit: Payer: Self-pay

## 2020-07-06 VITALS — Wt 216.0 lb

## 2020-07-06 VITALS — BP 144/84 | HR 92 | Ht 61.5 in | Wt 216.2 lb

## 2020-07-06 DIAGNOSIS — I10 Essential (primary) hypertension: Secondary | ICD-10-CM

## 2020-07-06 DIAGNOSIS — E782 Mixed hyperlipidemia: Secondary | ICD-10-CM

## 2020-07-06 DIAGNOSIS — E1165 Type 2 diabetes mellitus with hyperglycemia: Secondary | ICD-10-CM

## 2020-07-06 DIAGNOSIS — Z6841 Body Mass Index (BMI) 40.0 and over, adult: Secondary | ICD-10-CM | POA: Insufficient documentation

## 2020-07-06 LAB — POCT GLYCOSYLATED HEMOGLOBIN (HGB A1C): Hemoglobin A1C: 7.7 % — AB (ref 4.0–5.6)

## 2020-07-06 MED ORDER — LANTUS SOLOSTAR 100 UNIT/ML ~~LOC~~ SOPN: 30.0000 [IU] | PEN_INJECTOR | Freq: Every day | SUBCUTANEOUS | 2 refills | Status: DC

## 2020-07-06 NOTE — Progress Notes (Signed)
  Medical Nutrition Therapy:  Appt start time: 1330 end time:  [id number]  Assessment:  Primary concerns today: Diabetes Type 2, Obesity, HTN.A1C 7.7%.is the same as last visit.  Trying to  Avoid sweets. Eating more salad and vegetables. Getting better about eating breakfast. Drinking mostly water. Lantus 30 units, Metformin 1000 mg BID.    Using a walker. Not able to stand for long periods of time. Does some chair exercises at home.  Lab Results  Component Value Date   HGBA1C 7.7 (A) 07/06/2020   CMP Latest Ref Rng & Units 06/30/2020 11/06/2019 11/01/2018  Glucose 65 - 99 mg/dL 166(H) 135(H) 240(H)  BUN 7 - 25 mg/dL 14 16 11   Creatinine 0.60 - 0.93 mg/dL 0.73 0.88 0.85  Sodium 135 - 146 mmol/L 140 142 139  Potassium 3.5 - 5.3 mmol/L 4.1 4.6 3.7  Chloride 98 - 110 mmol/L 105 103 106  CO2 20 - 32 mmol/L 27 29 25   Calcium 8.6 - 10.4 mg/dL 8.6 9.6 9.0  Total Protein 6.1 - 8.1 g/dL 6.7 6.8 -  Total Bilirubin 0.2 - 1.2 mg/dL 0.2 0.4 -  Alkaline Phos 38 - 126 U/L - - -  AST 10 - 35 U/L 10 11 -  ALT 6 - 29 U/L 8 7 -   Lipid Panel     Component Value Date/Time   CHOL 120 11/06/2019 0810   TRIG 66 11/06/2019 0810   HDL 43 (L) 11/06/2019 0810   CHOLHDL 2.8 11/06/2019 0810   LDLCALC 62 11/06/2019 0810   .  Preferred Learning Style:   No preference indicated   Learning Readiness:   Ready  Change in progress   MEDICATIONS:   DIETARY INTAKE:      B) 2 boiled eggs and 2 slices toast, water L) shrimp and slaw, water D)  Meat and vegetables, water  Usual physical activity: ADL  Estimated energy needs: 1200 calories 135 g carbohydrates 90 g protein 33 g fat  Progress Towards Goal(s):  In progress.   Nutritional Diagnosis:  NB-1.1 Food and nutrition-related knowledge deficit As related to Diabetes Type 2, Obesity.  As evidenced by A1C 7.8% and BMI > 40.    Intervention:  Nutrition and Diabetes education provided on My Plate, CHO counting, meal planning, portion sizes,  timing of meals, avoiding snacks between meals unless having a low blood sugar, target ranges for A1C and blood sugars, signs/symptoms and treatment of hyper/hypoglycemia, monitoring blood sugars, taking medications as prescribed, benefits of exercising 30 minutes per day and prevention of complications of DM. Marland Kitchen  Goals  Eat meals on time. Increase walking in the house for exercise Lose 2 lbs a month. Cut out sweet tea Get A1C 7.5%    Teaching Method Utilized:  Visual Auditory Hands on  Handouts given during visit include:  The Plate Method   Diabetes Instructions.  Barriers to learning/adherence to lifestyle change: none  Demonstrated degree of understanding via:  Teach Back   Monitoring/Evaluation:  Dietary intake, exercise, , and body weight in 3 month(s).

## 2020-07-06 NOTE — Patient Instructions (Signed)

## 2020-07-06 NOTE — Patient Instructions (Addendum)
Goals  Eat meals on time. Increase walking in the house for exercise Lose 2 lbs a month. Cut out sweet tea Get A1C 7.5%

## 2020-07-06 NOTE — Progress Notes (Signed)
Endocrinology Consult Note       07/06/2020, 2:04 PM   Subjective:    Patient ID: Natalie Castro, female    DOB: [birth year].  Natalie Castro is being seen in follow-up after she was seen in consultation for management of currently uncontrolled symptomatic diabetes requested by  [name], MD.   Past Medical History:  Diagnosis Date  . Acid reflux   . Arthritis   . Colon adenomas    AGE 79  . Complete heart block (HCC)    STJ PPM Dr. [name] 11/24/15  . Essential hypertension   . Hyperlipidemia   . Presence of permanent cardiac pacemaker   . Type 2 diabetes mellitus ([place])     Past Surgical History:  Procedure Laterality Date  . COLONOSCOPY     3 SIMPLE ADENOMAS, AGE 83  . COLONOSCOPY N/A 05/24/2018   Procedure: COLONOSCOPY;  Surgeon: Danie Binder, MD;  Location: [facility];  Service: Endoscopy;  Laterality: N/A;  1:00pm  . EP IMPLANTABLE DEVICE N/A 11/24/2015   Procedure: Pacemaker Implant;  Surgeon: [name], MD;  Location: [facility];  Service: Cardiovascular;  Laterality: N/A;  . JOINT REPLACEMENT     knees bilat.  Marland Kitchen POLYPECTOMY  05/24/2018   Procedure: POLYPECTOMY;  Surgeon: Danie Binder, MD;  Location: [facility];  Service: Endoscopy;;  ascending and hepatic flexure, transverse  . TUBAL LIGATION      Social History   Socioeconomic History  . Marital status: Married    Spouse name: Not on file  . Number of children: Not on file  . Years of education: Not on file  . Highest education level: Not on file  Occupational History  . Occupation: retired  Tobacco Use  . Smoking status: Never Smoker  . Smokeless tobacco: Never Used  Vaping Use  . Vaping Use: Never used  Substance and Sexual Activity  . Alcohol use: No    Alcohol/week: 0.0 standard drinks  . Drug use: No  . Sexual activity: Yes    Birth control/protection: Post-menopausal  Other Topics Concern  .  Not on file  Social History Narrative   MARRIED FOR 58 YRS. 5 KIDS: #4 PRESENT TODAY(AGE 94)   Social Determinants of Health   Financial Resource Strain:   . Difficulty of Paying Living Expenses:   Food Insecurity:   . Worried About Charity fundraiser in the Last Year:   . Arboriculturist in the Last Year:   Transportation Needs:   . Film/video editor (Medical):   Marland Kitchen Lack of Transportation (Non-Medical):   Physical Activity:   . Days of Exercise per Week:   . Minutes of Exercise per Session:   Stress:   . Feeling of Stress :   Social Connections:   . Frequency of Communication with Friends and Family:   . Frequency of Social Gatherings with Friends and Family:   . Attends Religious Services:   . Active Member of Clubs or Organizations:   . Attends Archivist Meetings:   Marland Kitchen Marital Status:     Family History  Problem Relation Age of Onset  . Heart failure Mother   . Hypertension Mother   . Heart failure Sister   . Hypertension Father   . Cancer Sister   . Dementia Sister   . Colon cancer Neg Hx   . Colon polyps Neg Hx     Outpatient Encounter Medications as of 07/06/2020  Medication Sig  . hydrALAZINE (APRESOLINE) 50 MG tablet Take 50 mg by mouth 2 (two) times daily.  Marland Kitchen atorvastatin (LIPITOR) 40 MG tablet TAKE (1) TABLET BY MOUTH AT BEDTIME.  Marland Kitchen ELIQUIS 5 MG TABS tablet TAKE 1 TABLET BY MOUTH TWICE A DAY.  Marland Kitchen glucose blood test strip Use to check blood glucose fasting , before lunch and dinner and after your largest meal  . Lancets (ONETOUCH ULTRASOFT) lancets Use to check glucose 4 x daily  . LANTUS SOLOSTAR 100 UNIT/ML Solostar Pen Inject 30 Units into the skin at bedtime.  Marland Kitchen losartan (COZAAR) 100 MG tablet TAKE (1) TABLET BY MOUTH ONCE DAILY.  . magnesium gluconate (MAGONATE) 500 MG tablet Take 1 tablet (500 mg total) by mouth daily.  . Magnesium Oxide 500 MG TABS TAKE 1 TABLET BY MOUTH ONCE A DAY.  . metFORMIN (GLUCOPHAGE) 1000 MG tablet TAKE (1)  TABLET BY MOUTH TWICE DAILY.  . metoprolol succinate (TOPROL-XL) 25 MG 24 hr tablet Take 25 mg by mouth daily.  Marland Kitchen NIFEdipine (PROCARDIA-XL/NIFEDICAL-XL) 30 MG 24 hr tablet TAKE 1 TABLET BY MOUTH ONCE A DAY.  . [DISCONTINUED] hydrALAZINE (APRESOLINE) 25 MG tablet TAKE (1) TABLET BY MOUTH TWICE DAILY.  . [DISCONTINUED] LANTUS SOLOSTAR 100 UNIT/ML Solostar Pen Inject 30 Units into the skin at bedtime.    No facility-administered encounter medications on file as of 07/06/2020.    ALLERGIES: Allergies  Allergen Reactions  . Penicillins Hives, Itching and Other (See Comments)    Has patient had a PCN reaction causing immediate rash, facial/tongue/throat swelling, SOB or lightheadedness with hypotension: No Has patient had a PCN reaction causing severe rash involving mucus membranes or skin necrosis: Yes Has patient had a PCN reaction that required hospitalization No Has patient had a PCN reaction occurring within the last 10 years: No If all of the above answers are "NO", then may proceed with Cephalosporin use.     VACCINATION STATUS: Immunization History  Administered Date(s) Administered  . Fluad Quad(high Dose 65+) 11/04/2019  . Influenza, Seasonal, Injecte, Preservative Fre 09/25/2017  . Influenza-Unspecified 10/26/2018  . Moderna SARS-COVID-2 Vaccination 02/25/2020, 03/24/2020  . Pneumococcal Polysaccharide-23 02/24/2017    Diabetes She presents for her follow-up diabetic visit. She has type 2 diabetes mellitus. Onset time: She was diagnosed at approximate age of 28 years. Her disease course has been stable. There are no hypoglycemic associated symptoms. Pertinent negatives for hypoglycemia include no confusion, headaches, pallor or seizures. There are no diabetic associated symptoms. Pertinent negatives for diabetes include no chest pain, no polydipsia, no polyphagia and no polyuria. There are no hypoglycemic complications. Symptoms are stable. Diabetic complications include heart  disease. Pertinent negatives for diabetic complications include no nephropathy. Risk factors for coronary artery disease include tobacco exposure, obesity, hypertension, dyslipidemia, diabetes mellitus, post-menopausal and sedentary lifestyle. Current diabetic treatment includes insulin injections. She is compliant with treatment most of the time. Her weight is fluctuating minimally. She is following a generally unhealthy diet. When asked about meal planning, she reported none. She has had a previous visit with a dietitian. Her home blood glucose trend is fluctuating minimally. (She did not bring any logs  nor meter to review.  Her point-of-care A1c remains stable at 7.7%.  She has improved her A1c overall from 10%.   ) An ACE inhibitor/angiotensin II receptor blocker is being taken.  Hyperlipidemia This is a chronic problem. Exacerbating diseases include diabetes. Pertinent negatives include no chest pain, myalgias or shortness of breath. Current antihyperlipidemic treatment includes statins. Risk factors for coronary artery disease include diabetes mellitus, dyslipidemia, family history, hypertension, post-menopausal and a sedentary lifestyle.  Hypertension This is a chronic problem. The current episode started more than 1 year ago. Pertinent negatives include no chest pain, headaches, palpitations or shortness of breath. Risk factors for coronary artery disease include diabetes mellitus, dyslipidemia, sedentary lifestyle and post-menopausal state.     Review of Systems  Constitutional: Negative for chills, fever and unexpected weight change.  HENT: Negative for trouble swallowing and voice change.   Eyes: Negative for visual disturbance.  Respiratory: Negative for cough, shortness of breath and wheezing.   Cardiovascular: Negative for chest pain, palpitations and leg swelling.  Gastrointestinal: Negative for diarrhea, nausea and vomiting.  Endocrine: Negative for cold intolerance, heat intolerance,  polydipsia, polyphagia and polyuria.  Musculoskeletal: Negative for arthralgias and myalgias.  Skin: Negative for color change, pallor, rash and wound.  Neurological: Negative for seizures and headaches.  Psychiatric/Behavioral: Negative for confusion and suicidal ideas.    Objective:    Vitals with BMI 07/06/2020 07/06/2020 04/06/2020  Height - 5' 1.5" 5' 1.5"  Weight 216 lbs 216 lbs 3 oz 215 lbs  BMI 40.16 90.24 09.73  Systolic - 532 992  Diastolic - 84 63  Pulse - 92 80    BP (!) 144/84   Pulse 92   Ht 5' 1.5" (1.562 m)   Wt 216 lb 3.2 oz (98.1 kg)   BMI 40.19 kg/m   Wt Readings from Last 3 Encounters:  07/06/20 216 lb (98 kg)  07/06/20 216 lb 3.2 oz (98.1 kg)  04/06/20 215 lb (97.5 kg)     Physical Exam Constitutional:      Appearance: She is well-developed.  HENT:     Head: Normocephalic and atraumatic.  Neck:     Thyroid: No thyromegaly.     Trachea: No tracheal deviation.  Cardiovascular:     Rate and Rhythm: Normal rate and regular rhythm.  Pulmonary:     Effort: Pulmonary effort is normal.  Abdominal:     Tenderness: There is no abdominal tenderness. There is no guarding.  Musculoskeletal:        General: Normal range of motion.     Cervical back: Normal range of motion and neck supple.     Comments: Walks with a cane.  Skin:    General: Skin is warm and dry.     Coloration: Skin is not pale.     Findings: No erythema or rash.  Neurological:     Mental Status: She is alert and oriented to person, place, and time.     Cranial Nerves: No cranial nerve deficit.     Coordination: Coordination normal.     Deep Tendon Reflexes: Reflexes are normal and symmetric.  Psychiatric:        Judgment: Judgment normal.       CMP ( most recent) CMP     Component Value Date/Time   NA 140 06/30/2020 1527   K 4.1 06/30/2020 1527   CL 105 06/30/2020 1527   CO2 27 06/30/2020 1527   GLUCOSE 166 (H) 06/30/2020 1527   BUN 14 06/30/2020 1527  CREATININE 0.73  06/30/2020 1527   CALCIUM 8.6 06/30/2020 1527   PROT 6.7 06/30/2020 1527   ALBUMIN 3.1 (L) 02/22/2017 2141   AST 10 06/30/2020 1527   ALT 8 06/30/2020 1527   ALKPHOS 57 02/22/2017 2141   BILITOT 0.2 06/30/2020 1527   GFRNONAA 79 06/30/2020 1527   GFRAA 91 06/30/2020 1527     Diabetic Labs (most recent): Lab Results  Component Value Date   HGBA1C 7.7 (A) 07/06/2020   HGBA1C 7.7 (A) 03/10/2020   HGBA1C 10.0 (A) 11/04/2019    Lipid Panel     Component Value Date/Time   CHOL 120 11/06/2019 0810   TRIG 66 11/06/2019 0810   HDL 43 (L) 11/06/2019 0810   CHOLHDL 2.8 11/06/2019 0810   LDLCALC 62 11/06/2019 0810      Lab Results  Component Value Date   TSH 0.88 06/30/2020   TSH 0.75 11/06/2019   TSH 0.573 11/01/2018   TSH 0.150 (L) 02/22/2017   TSH 0.713 11/23/2015   FREET4 1.2 06/30/2020      Assessment & Plan:   1. Uncontrolled type 2 diabetes mellitus with hyperglycemia (HCC)   - Natalie Castro has currently uncontrolled symptomatic type 2 DM since  79 years of age.  She seems to have responded to recent adjustment of her treatment. She did not bring any logs nor meter to review.  He denies hypoglycemia.  Her point-of-care A1c is 7.7%, improving from 1o+%.   - Recent labs reviewed. - I had a long discussion with her about the progressive nature of diabetes and the pathology behind its complications. -her diabetes is complicated by coronary artery disease, retinopathy, deconditioning/sedentary life, obesity and she remains at a high risk for more acute and chronic complications which include CAD, CVA, CKD, retinopathy, and neuropathy. These are all discussed in detail with her.  - I have counseled her on diet  and weight management  by adopting a carbohydrate restricted/protein rich diet. Patient is encouraged to switch to  unprocessed or minimally processed     complex starch and increased protein intake (animal or plant source), fruits, and vegetables. -  she is  advised to stick to a routine mealtimes to eat 3 meals  a day and avoid unnecessary snacks ( to snack only to correct hypoglycemia).   - she  admits there is a room for improvement in her diet and drink choices. -  Suggestion is made for her to avoid simple carbohydrates  from her diet including Cakes, Sweet Desserts / Pastries, Ice Cream, Soda (diet and regular), Sweet Tea, Candies, Chips, Cookies, Sweet Pastries,  Store Bought Juices, Alcohol in Excess of  1-2 drinks a day, Artificial Sweeteners, Coffee Creamer, and "Sugar-free" Products. This will help patient to have stable blood glucose profile and potentially avoid unintended weight gain.  - she will be scheduled with Jearld Fenton, RDN, CDE for diabetes education.  - I have approached her with the following individualized plan to manage  her diabetes and patient agrees:   - she will continue to need at least basal insulin in order for her to maintain control of diabetes to target.  She is advised to continue Lantus 30 units nightly, associated with monitoring of blood glucose twice a day-daily before breakfast and at bedtime.   - she is warned not to take insulin without proper monitoring per orders.  - she is encouraged to call clinic for blood glucose levels less than 70 or above 300 mg /dl. - she  is advised to continue Metformin 1000 mg p.o. twice daily, therapeutically suitable for patient .  - she will be considered for incretin therapy as appropriate next visit.  - Specific targets for  A1c;  LDL, HDL,  and Triglycerides were discussed with the patient.  2) Blood Pressure /Hypertension:  her blood pressure is not controlled to target.   she is advised to continue her current medications including Procardia XL/Nifedical XL  30mg  p.o. daily with breakfast .  She is also on Cozaar 100 mg p.o. daily.  3) Lipids/Hyperlipidemia:   Review of her recent lipid panel showed  controlled  LDL at 62 .  she  is advised to continue atorvastatin  40 mg p.o. daily at bedtime.  Side effects and precautions discussed with her.  4)  Weight/Diet:  Body mass index is 40.19 kg/m.  -   clearly complicating her diabetes care.   she is  a candidate for weight loss. I discussed with her the fact that loss of 5 - 10% of her  current body weight will have the most impact on her diabetes management.  Exercise, and detailed carbohydrates information provided  -  detailed on discharge instructions.  5) Chronic Care/Health Maintenance:  -she  is on ACEI/ARB and Statin medications and  is encouraged to initiate and continue to follow up with Ophthalmology, Dentist,  Podiatrist at least yearly or according to recommendations, and advised to   stay away from smoking. I have recommended yearly flu vaccine and pneumonia vaccine at least every 5 years; moderate intensity exercise for up to 150 minutes weekly; and  sleep for at least 7 hours a day.  - she is  advised to maintain close follow up with [name], MD for primary care needs, as well as her other providers for optimal and coordinated care.   - Time spent on this patient care encounter:  35 min, of which > 50% was spent in  counseling and the rest reviewing her blood glucose logs , discussing her hypoglycemia and hyperglycemia episodes, reviewing her current and  previous labs / studies  ( including abstraction from other facilities) and medications  doses and developing a  long term treatment plan and documenting her care.   Please refer to Patient Instructions for Blood Glucose Monitoring and Insulin/Medications Dosing Guide"  in media tab for additional information. Please  also refer to " Patient Self Inventory" in the Media  tab for reviewed elements of pertinent patient history.  Natalie Castro participated in the discussions, expressed understanding, and voiced agreement with the above plans.  All questions were answered to her satisfaction. she is encouraged to contact clinic should she have any  questions or concerns prior to her return visit.   Follow up plan: - Return in about 4 months (around 11/06/2020) for Bring Meter and Logs- A1c in Office.  Glade Lloyd, MD [facility] [address] [place], [place] [address] Phone: [phone]  Fax: [phone]    07/06/2020, 2:04 PM  This note was partially dictated with voice recognition software. Similar sounding words can be transcribed inadequately or may not  be corrected upon review.

## 2020-07-14 ENCOUNTER — Encounter: Payer: Self-pay | Admitting: Nutrition

## 2020-07-14 DIAGNOSIS — I10 Essential (primary) hypertension: Secondary | ICD-10-CM | POA: Diagnosis not present

## 2020-08-19 ENCOUNTER — Encounter: Payer: Self-pay | Admitting: Podiatry

## 2020-08-19 ENCOUNTER — Ambulatory Visit (INDEPENDENT_AMBULATORY_CARE_PROVIDER_SITE_OTHER): Payer: Medicare Other | Admitting: *Deleted

## 2020-08-19 ENCOUNTER — Ambulatory Visit: Payer: Medicare Other | Admitting: Podiatry

## 2020-08-19 ENCOUNTER — Other Ambulatory Visit: Payer: Self-pay

## 2020-08-19 DIAGNOSIS — M2042 Other hammer toe(s) (acquired), left foot: Secondary | ICD-10-CM

## 2020-08-19 DIAGNOSIS — M79675 Pain in left toe(s): Secondary | ICD-10-CM

## 2020-08-19 DIAGNOSIS — M2041 Other hammer toe(s) (acquired), right foot: Secondary | ICD-10-CM

## 2020-08-19 DIAGNOSIS — M79674 Pain in right toe(s): Secondary | ICD-10-CM

## 2020-08-19 DIAGNOSIS — L84 Corns and callosities: Secondary | ICD-10-CM | POA: Diagnosis not present

## 2020-08-19 DIAGNOSIS — E119 Type 2 diabetes mellitus without complications: Secondary | ICD-10-CM

## 2020-08-19 DIAGNOSIS — B351 Tinea unguium: Secondary | ICD-10-CM | POA: Diagnosis not present

## 2020-08-19 DIAGNOSIS — I442 Atrioventricular block, complete: Secondary | ICD-10-CM | POA: Diagnosis not present

## 2020-08-19 DIAGNOSIS — M2011 Hallux valgus (acquired), right foot: Secondary | ICD-10-CM

## 2020-08-19 DIAGNOSIS — M2012 Hallux valgus (acquired), left foot: Secondary | ICD-10-CM

## 2020-08-20 LAB — CUP PACEART REMOTE DEVICE CHECK
Battery Remaining Longevity: 127 mo
Battery Remaining Percentage: 95.5 %
Battery Voltage: 3.01 V
Brady Statistic AP VP Percent: 1 %
Brady Statistic AP VS Percent: 1.3 %
Brady Statistic AS VP Percent: 11 %
Brady Statistic AS VS Percent: 87 %
Brady Statistic RA Percent Paced: 1.2 %
Brady Statistic RV Percent Paced: 11 %
Date Time Interrogation Session: 20210825020017
Implantable Lead Implant Date: 20161129
Implantable Lead Implant Date: 20161129
Implantable Lead Location: 753859
Implantable Lead Location: 753860
Implantable Lead Model: 5076
Implantable Pulse Generator Implant Date: 20161129
Lead Channel Impedance Value: 360 Ohm
Lead Channel Impedance Value: 480 Ohm
Lead Channel Pacing Threshold Amplitude: 0.75 V
Lead Channel Pacing Threshold Amplitude: 0.875 V
Lead Channel Pacing Threshold Pulse Width: 0.4 ms
Lead Channel Pacing Threshold Pulse Width: 0.4 ms
Lead Channel Sensing Intrinsic Amplitude: 4 mV
Lead Channel Sensing Intrinsic Amplitude: 8.6 mV
Lead Channel Setting Pacing Amplitude: 1.875
Lead Channel Setting Pacing Amplitude: 2 V
Lead Channel Setting Pacing Pulse Width: 0.4 ms
Lead Channel Setting Sensing Sensitivity: 2 mV
Pulse Gen Model: 2240
Pulse Gen Serial Number: 7826037

## 2020-08-23 NOTE — Progress Notes (Signed)
Subjective: Natalie Castro presents today preventative diabetic foot care and painful callus(es) right foot and painful mycotic toenails b/l that are difficult to trim. Pain interferes with ambulation. Aggravating factors include wearing enclosed shoe gear. Pain is relieved with periodic professional debridement.   She states she did not check her blood sugar this morning. Last night's blood sugar was 163 mg/dl.   Natalie Squibb, MD is patient's PCP. Last visit was last month per patient recall.  She voices no new pedal problems on today's visit.  Past Medical History:  Diagnosis Date  . Acid reflux   . Arthritis   . Colon adenomas    AGE 76  . Complete heart block (HCC)    STJ PPM Dr. [name] 11/24/15  . Essential hypertension   . Hyperlipidemia   . Presence of permanent cardiac pacemaker   . Type 2 diabetes mellitus ([place])      Current Outpatient Medications on File Prior to Visit  Medication Sig Dispense Refill  . atorvastatin (LIPITOR) 40 MG tablet TAKE (1) TABLET BY MOUTH AT BEDTIME. 30 tablet 0  . ELIQUIS 5 MG TABS tablet TAKE 1 TABLET BY MOUTH TWICE A DAY. 60 tablet 0  . glucose blood test strip Use to check blood glucose fasting , before lunch and dinner and after your largest meal 120 each 12  . hydrALAZINE (APRESOLINE) 50 MG tablet Take 50 mg by mouth 2 (two) times daily.    . Lancets (ONETOUCH ULTRASOFT) lancets Use to check glucose 4 x daily 120 each 12  . LANTUS SOLOSTAR 100 UNIT/ML Solostar Pen Inject 30 Units into the skin at bedtime. 10 pen 2  . losartan (COZAAR) 100 MG tablet TAKE (1) TABLET BY MOUTH ONCE DAILY. 30 tablet 0  . magnesium gluconate (MAGONATE) 500 MG tablet Take 1 tablet (500 mg total) by mouth daily. 90 tablet 3  . Magnesium Oxide 500 MG TABS TAKE 1 TABLET BY MOUTH ONCE A DAY. 30 tablet 11  . metFORMIN (GLUCOPHAGE) 1000 MG tablet TAKE (1) TABLET BY MOUTH TWICE DAILY. 60 tablet 0  . metoprolol succinate (TOPROL-XL) 25 MG 24 hr tablet Take 25 mg by mouth  daily.    Marland Kitchen NIFEdipine (PROCARDIA-XL/NIFEDICAL-XL) 30 MG 24 hr tablet TAKE 1 TABLET BY MOUTH ONCE A DAY. 30 tablet 0   No current facility-administered medications on file prior to visit.     Allergies  Allergen Reactions  . Penicillins Hives, Itching and Other (See Comments)    Has patient had a PCN reaction causing immediate rash, facial/tongue/throat swelling, SOB or lightheadedness with hypotension: No Has patient had a PCN reaction causing severe rash involving mucus membranes or skin necrosis: Yes Has patient had a PCN reaction that required hospitalization No Has patient had a PCN reaction occurring within the last 10 years: No If all of the above answers are "NO", then may proceed with Cephalosporin use.     Objective: Natalie Castro is a pleasant 79 y.o. y.o. Patient Race: Black or African American [2]  female in NAD. AAO x 3.  There were no vitals filed for this visit.  Vascular Examination: Neurovascular status unchanged b/l lower extremities. Capillary fill time to digits <3 seconds b/l lower extremities. Faintly palpable pedal pulses b/l. Pedal hair absent b/l Skin temperature gradient within normal limits b/l.  Dermatological Examination: Pedal skin with normal turgor, texture and tone bilaterally. No open wounds bilaterally. No interdigital macerations bilaterally. Toenails 1-5 b/l elongated, discolored, dystrophic, thickened, crumbly with subungual debris and  tenderness to dorsal palpation. Hyperkeratotic lesion(s) R hallux.  No erythema, no edema, no drainage, no flocculence.  Musculoskeletal: Normal muscle strength 5/5 to all lower extremity muscle groups bilaterally. No pain crepitus or joint limitation noted with ROM b/l. 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 Hammertoes noted to the L 5th toe and R 5th toe.  Neurological Examination: Protective sensation intact 5/5 intact bilaterally with 10g monofilament b/l. Vibratory sensation intact  b/l. Proprioception intact bilaterally.  Assessment: 1. Pain due to onychomycosis of toenails of both feet   2. Callus   3. Hallux valgus, acquired, bilateral   4. Acquired hammertoes of both feet   5. Controlled type 2 diabetes mellitus without complication, without long-term current use of insulin ([place])    Plan: -Examined patient. -Continue diabetic foot care principles. -Toenails 1-5 b/l were debrided in length and girth with sterile nail nippers and dremel without iatrogenic bleeding.  -Callus(es) R hallux pared utilizing sterile scalpel blade without complication or incident. Total number debrided =1. -Patient to report any pedal injuries to medical professional immediately. -Patient to continue soft, supportive shoe gear daily. Start procedure for diabetic shoes. Patient qualifies based on diagnoses. -Patient/POA to call should there be question/concern in the interim.  Return in about 3 months (around 11/19/2020) for nail and callus trim.  Marzetta Board, DPM

## 2020-08-24 NOTE — Progress Notes (Signed)
Remote pacemaker transmission.   

## 2020-08-27 ENCOUNTER — Telehealth: Payer: Self-pay | Admitting: Podiatry

## 2020-08-27 NOTE — Telephone Encounter (Signed)
Pt left message checking to see if we received paperwork for diabetic shoes yet.  I returned call and spoke to pt and let her know we have not gotten it yet. She is going to call the pcp office because she dropped it off there Monday.

## 2020-09-18 ENCOUNTER — Telehealth: Payer: Self-pay | Admitting: Podiatry

## 2020-09-18 NOTE — Telephone Encounter (Signed)
Pt left message checking status of diabetic shoes.  I returned call and told pt her pcp has not signed off on paperwork yet and that is what the hold up is. I tld pt I just refaxed it again. She was calling the pcp office about it.

## 2020-09-28 DIAGNOSIS — E113293 Type 2 diabetes mellitus with mild nonproliferative diabetic retinopathy without macular edema, bilateral: Secondary | ICD-10-CM | POA: Diagnosis not present

## 2020-10-23 ENCOUNTER — Other Ambulatory Visit: Payer: Self-pay

## 2020-10-23 ENCOUNTER — Ambulatory Visit (INDEPENDENT_AMBULATORY_CARE_PROVIDER_SITE_OTHER): Payer: Medicare Other | Admitting: Orthotics

## 2020-10-23 DIAGNOSIS — M2041 Other hammer toe(s) (acquired), right foot: Secondary | ICD-10-CM | POA: Diagnosis not present

## 2020-10-23 DIAGNOSIS — E119 Type 2 diabetes mellitus without complications: Secondary | ICD-10-CM | POA: Diagnosis not present

## 2020-10-23 DIAGNOSIS — M2012 Hallux valgus (acquired), left foot: Secondary | ICD-10-CM | POA: Diagnosis not present

## 2020-10-23 DIAGNOSIS — M2011 Hallux valgus (acquired), right foot: Secondary | ICD-10-CM | POA: Diagnosis not present

## 2020-10-23 DIAGNOSIS — M2042 Other hammer toe(s) (acquired), left foot: Secondary | ICD-10-CM | POA: Diagnosis not present

## 2020-11-06 DIAGNOSIS — I10 Essential (primary) hypertension: Secondary | ICD-10-CM | POA: Diagnosis not present

## 2020-11-06 DIAGNOSIS — R7301 Impaired fasting glucose: Secondary | ICD-10-CM | POA: Diagnosis not present

## 2020-11-06 DIAGNOSIS — Z0000 Encounter for general adult medical examination without abnormal findings: Secondary | ICD-10-CM | POA: Diagnosis not present

## 2020-11-09 ENCOUNTER — Ambulatory Visit: Payer: Medicare Other | Admitting: "Endocrinology

## 2020-11-09 ENCOUNTER — Encounter: Payer: Self-pay | Admitting: Nutrition

## 2020-11-09 ENCOUNTER — Encounter: Payer: Medicare Other | Attending: "Endocrinology | Admitting: Nutrition

## 2020-11-09 ENCOUNTER — Encounter: Payer: Self-pay | Admitting: "Endocrinology

## 2020-11-09 ENCOUNTER — Other Ambulatory Visit: Payer: Self-pay

## 2020-11-09 VITALS — BP 150/82 | HR 92 | Ht 61.5 in | Wt 213.8 lb

## 2020-11-09 VITALS — Ht 64.0 in | Wt 213.0 lb

## 2020-11-09 DIAGNOSIS — I10 Essential (primary) hypertension: Secondary | ICD-10-CM | POA: Diagnosis not present

## 2020-11-09 DIAGNOSIS — E782 Mixed hyperlipidemia: Secondary | ICD-10-CM | POA: Diagnosis not present

## 2020-11-09 DIAGNOSIS — E1165 Type 2 diabetes mellitus with hyperglycemia: Secondary | ICD-10-CM

## 2020-11-09 LAB — POCT GLYCOSYLATED HEMOGLOBIN (HGB A1C): Hemoglobin A1C: 7.5 % — AB (ref 4.0–5.6)

## 2020-11-09 NOTE — Progress Notes (Signed)
Medical Nutrition Therapy:  Appt start time: 1330 end time:  7619  Assessment:  Primary concerns today: Diabetes Type 2, Obesity, HTN.A1C 7.7%.is the same as last visit.   A1C down to 10% a year ago is down to 7.7%. Eating more greens. Trying to stay active picking greens and doing stuff around the house. Drinking water but feels like she could do more. Avoiding sweets. Lost 3 lbs.  Feels much beter.  Will stay at 30 units of Lantus and 1000 mg BID of Metformin. Saw Dr. Dorris Fetch today. Lab Results  Component Value Date   HGBA1C 7.5 (A) 11/09/2020   CMP Latest Ref Rng & Units 06/30/2020 11/06/2019 11/01/2018  Glucose 65 - 99 mg/dL 166(H) 135(H) 240(H)  BUN 7 - 25 mg/dL 14 16 11   Creatinine 0.60 - 0.93 mg/dL 0.73 0.88 0.85  Sodium 135 - 146 mmol/L 140 142 139  Potassium 3.5 - 5.3 mmol/L 4.1 4.6 3.7  Chloride 98 - 110 mmol/L 105 103 106  CO2 20 - 32 mmol/L 27 29 25   Calcium 8.6 - 10.4 mg/dL 8.6 9.6 9.0  Total Protein 6.1 - 8.1 g/dL 6.7 6.8 -  Total Bilirubin 0.2 - 1.2 mg/dL 0.2 0.4 -  Alkaline Phos 38 - 126 U/L - - -  AST 10 - 35 U/L 10 11 -  ALT 6 - 29 U/L 8 7 -   Lipid Panel     Component Value Date/Time   CHOL 120 11/06/2019 0810   TRIG 66 11/06/2019 0810   HDL 43 (L) 11/06/2019 0810   CHOLHDL 2.8 11/06/2019 0810   LDLCALC 62 11/06/2019 0810   .  Preferred Learning Style:   No preference indicated   Learning Readiness:   Ready  Change in progress   MEDICATIONS:   DIETARY INTAKE:      B) 2 boiled eggs and oatmeal , water L) D)  Meat and vegetables, water  Usual physical activity: ADL  Estimated energy needs: 1200 calories 135 g carbohydrates 90 g protein 33 g fat  Progress Towards Goal(s):  In progress.   Nutritional Diagnosis:  NB-1.1 Food and nutrition-related knowledge deficit As related to Diabetes Type 2, Obesity.  As evidenced by A1C 7.8% and BMI > 40.    Intervention:  Nutrition and Diabetes education provided on My Plate, CHO counting, meal  planning, portion sizes, timing of meals, avoiding snacks between meals unless having a low blood sugar, target ranges for A1C and blood sugars, signs/symptoms and treatment of hyper/hypoglycemia, monitoring blood sugars, taking medications as prescribed, benefits of exercising 30 minutes per day and prevention of complications of DM. Marland Kitchen  Goals  Keep eating foods out of the garden. Increase water intake. Get A1C to 7%. Stay active as much as possible.  Lose 5 lbs by next visit.  Teaching Method Utilized:  Visual Auditory Hands on  Handouts given during visit include:  The Plate Method   Diabetes Instructions.  Barriers to learning/adherence to lifestyle change: none  Demonstrated degree of understanding via:  Teach Back   Monitoring/Evaluation:  Dietary intake, exercise, , and body weight in 3 month(s).          Medical Nutrition Therapy:  Appt start time: 1330 end time:  5093  Assessment:  Primary concerns today: Diabetes Type 2, Obesity, HTN.A1C 7.7%.is the same as last visit.  Trying to  Avoid sweets. Eating more salad and vegetables. Getting better about eating breakfast. Drinking mostly water. Lantus 30 units, Metformin 1000 mg BID.  Using a walker. Not able to stand for long periods of time. Does some chair exercises at home.  Lab Results  Component Value Date   HGBA1C 7.5 (A) 11/09/2020   CMP Latest Ref Rng & Units 06/30/2020 11/06/2019 11/01/2018  Glucose 65 - 99 mg/dL 166(H) 135(H) 240(H)  BUN 7 - 25 mg/dL 14 16 11   Creatinine 0.60 - 0.93 mg/dL 0.73 0.88 0.85  Sodium 135 - 146 mmol/L 140 142 139  Potassium 3.5 - 5.3 mmol/L 4.1 4.6 3.7  Chloride 98 - 110 mmol/L 105 103 106  CO2 20 - 32 mmol/L 27 29 25   Calcium 8.6 - 10.4 mg/dL 8.6 9.6 9.0  Total Protein 6.1 - 8.1 g/dL 6.7 6.8 -  Total Bilirubin 0.2 - 1.2 mg/dL 0.2 0.4 -  Alkaline Phos 38 - 126 U/L - - -  AST 10 - 35 U/L 10 11 -  ALT 6 - 29 U/L 8 7 -   Lipid Panel     Component Value Date/Time    CHOL 120 11/06/2019 0810   TRIG 66 11/06/2019 0810   HDL 43 (L) 11/06/2019 0810   CHOLHDL 2.8 11/06/2019 0810   LDLCALC 62 11/06/2019 0810   .  Preferred Learning Style:   No preference indicated   Learning Readiness:   Ready  Change in progress   MEDICATIONS:   DIETARY INTAKE:      B) 2 boiled eggs and 2 slices toast, water L) shrimp and slaw, water D)  Meat and vegetables, water  Usual physical activity: ADL  Estimated energy needs: 1200 calories 135 g carbohydrates 90 g protein 33 g fat  Progress Towards Goal(s):  In progress.   Nutritional Diagnosis:  NB-1.1 Food and nutrition-related knowledge deficit As related to Diabetes Type 2, Obesity.  As evidenced by A1C 7.8% and BMI > 40.    Intervention:  Nutrition and Diabetes education provided on My Plate, CHO counting, meal planning, portion sizes, timing of meals, avoiding snacks between meals unless having a low blood sugar, target ranges for A1C and blood sugars, signs/symptoms and treatment of hyper/hypoglycemia, monitoring blood sugars, taking medications as prescribed, benefits of exercising 30 minutes per day and prevention of complications of DM. Marland Kitchen  Goals  Eat meals on time. Increase walking in the house for exercise Lose 2 lbs a month. Cut out sweet tea Get A1C 7.5%    Teaching Method Utilized:  Visual Auditory Hands on  Handouts given during visit include:  The Plate Method   Diabetes Instructions.  Barriers to learning/adherence to lifestyle change: none  Demonstrated degree of understanding via:  Teach Back   Monitoring/Evaluation:  Dietary intake, exercise, , and body weight in 3 month(s).

## 2020-11-09 NOTE — Progress Notes (Signed)
11/09/2020, 2:08 PM  Endocrinology follow-up note   Subjective:    Patient ID: Natalie Castro, female    DOB: 26-May-1941.  Natalie Castro is being seen in follow-up after she was seen in consultation for management of currently uncontrolled symptomatic diabetes requested by  [name], MD.   Past Medical History:  Diagnosis Date  . Acid reflux   . Arthritis   . Colon adenomas    AGE 79  . Complete heart block (HCC)    STJ PPM Dr. [name] 11/24/15  . Essential hypertension   . Hyperlipidemia   . Presence of permanent cardiac pacemaker   . Type 2 diabetes mellitus ([place])     Past Surgical History:  Procedure Laterality Date  . COLONOSCOPY     3 SIMPLE ADENOMAS, AGE 30  . COLONOSCOPY N/A 05/24/2018   Procedure: COLONOSCOPY;  Surgeon: Danie Binder, MD;  [facility];  Service: Endoscopy;  Laterality: N/A;  1:00pm  . EP IMPLANTABLE DEVICE N/A 11/24/2015   Procedure: Pacemaker Implant;  Surgeon: [name], MD;  Location: [facility];  Service: Cardiovascular;  Laterality: N/A;  . JOINT REPLACEMENT     knees bilat.  Marland Kitchen POLYPECTOMY  05/24/2018   Procedure: POLYPECTOMY;  Surgeon: Danie Binder, MD;  Location: [facility];  Service: Endoscopy;;  ascending and hepatic flexure, transverse  . TUBAL LIGATION      Social History   Socioeconomic History  . Marital status: Married    Spouse name: Not on file  . Number of children: Not on file  . Years of education: Not on file  . Highest education level: Not on file  Occupational History  . Occupation: retired  Tobacco Use  . Smoking status: Never Smoker  . Smokeless tobacco: Never Used  Vaping Use  . Vaping Use: Never used  Substance and Sexual Activity  . Alcohol use: No    Alcohol/week: 0.0 standard drinks  . Drug use: No  . Sexual activity: Yes    Birth control/protection: Post-menopausal  Other Topics Concern  . Not on  file  Social History Narrative   MARRIED FOR 109 YRS. 5 KIDS: #4 PRESENT TODAY(AGE 79)   Social Determinants of Health   Financial Resource Strain:   . Difficulty of Paying Living Expenses: Not on file  Food Insecurity:   . Worried About Charity fundraiser in the Last Year: Not on file  . Ran Out of Food in the Last Year: Not on file  Transportation Needs:   . Lack of Transportation (Medical): Not on file  . Lack of Transportation (Non-Medical): Not on file  Physical Activity:   . Days of Exercise per Week: Not on file  . Minutes of Exercise per Session: Not on file  Stress:   . Feeling of Stress : Not on file  Social Connections:   . Frequency of Communication with Friends and Family: Not on file  . Frequency of Social Gatherings with Friends and Family: Not on file  . Attends Religious Services: Not on file  . Active Member of Clubs or Organizations: Not on file  . Attends Archivist Meetings:  Not on file  . Marital Status: Not on file    Family History  Problem Relation Age of Onset  . Heart failure Mother   . Hypertension Mother   . Heart failure Sister   . Hypertension Father   . Cancer Sister   . Dementia Sister   . Colon cancer Neg Hx   . Colon polyps Neg Hx     Outpatient Encounter Medications as of 11/09/2020  Medication Sig  . atorvastatin (LIPITOR) 40 MG tablet TAKE (1) TABLET BY MOUTH AT BEDTIME.  Marland Kitchen ELIQUIS 5 MG TABS tablet TAKE 1 TABLET BY MOUTH TWICE A DAY.  Marland Kitchen glucose blood test strip Use to check blood glucose fasting , before lunch and dinner and after your largest meal  . hydrALAZINE (APRESOLINE) 50 MG tablet Take 50 mg by mouth 2 (two) times daily.  . Lancets (ONETOUCH ULTRASOFT) lancets Use to check glucose 4 x daily  . LANTUS SOLOSTAR 100 UNIT/ML Solostar Pen Inject 30 Units into the skin at bedtime.  Marland Kitchen losartan (COZAAR) 100 MG tablet TAKE (1) TABLET BY MOUTH ONCE DAILY.  . magnesium gluconate (MAGONATE) 500 MG tablet Take 1 tablet (500  mg total) by mouth daily.  . Magnesium Oxide 500 MG TABS TAKE 1 TABLET BY MOUTH ONCE A DAY.  . metFORMIN (GLUCOPHAGE) 1000 MG tablet TAKE (1) TABLET BY MOUTH TWICE DAILY.  . metoprolol succinate (TOPROL-XL) 25 MG 24 hr tablet Take 25 mg by mouth daily.  Marland Kitchen NIFEdipine (PROCARDIA-XL/NIFEDICAL-XL) 30 MG 24 hr tablet TAKE 1 TABLET BY MOUTH ONCE A DAY.   No facility-administered encounter medications on file as of 11/09/2020.    ALLERGIES: Allergies  Allergen Reactions  . Penicillins Hives, Itching and Other (See Comments)    Has patient had a PCN reaction causing immediate rash, facial/tongue/throat swelling, SOB or lightheadedness with hypotension: No Has patient had a PCN reaction causing severe rash involving mucus membranes or skin necrosis: Yes Has patient had a PCN reaction that required hospitalization No Has patient had a PCN reaction occurring within the last 10 years: No If all of the above answers are "NO", then may proceed with Cephalosporin use.     VACCINATION STATUS: Immunization History  Administered Date(s) Administered  . Fluad Quad(high Dose 65+) 11/04/2019  . Influenza, Seasonal, Injecte, Preservative Fre 09/25/2017  . Influenza-Unspecified 10/26/2018  . Moderna SARS-COVID-2 Vaccination 02/25/2020, 03/24/2020  . Pneumococcal Polysaccharide-23 02/24/2017    Diabetes She presents for her follow-up diabetic visit. She has type 2 diabetes mellitus. Onset time: She was diagnosed at approximate age of 21 years. Her disease course has been improving. There are no hypoglycemic associated symptoms. Pertinent negatives for hypoglycemia include no confusion, headaches, pallor or seizures. There are no diabetic associated symptoms. Pertinent negatives for diabetes include no chest pain, no polydipsia, no polyphagia and no polyuria. There are no hypoglycemic complications. Symptoms are improving. Diabetic complications include heart disease. Pertinent negatives for diabetic  complications include no nephropathy. Risk factors for coronary artery disease include tobacco exposure, obesity, hypertension, dyslipidemia, diabetes mellitus, post-menopausal and sedentary lifestyle. Current diabetic treatment includes insulin injections. She is compliant with treatment most of the time. Her weight is fluctuating minimally. She is following a generally unhealthy diet. When asked about meal planning, she reported none. She has had a previous visit with a dietitian. Her home blood glucose trend is decreasing steadily. Her breakfast blood glucose range is generally 140-180 mg/dl. Her bedtime blood glucose range is generally 140-180 mg/dl. (She brought her meter showing  controlled fasting glycemic profile averaging 156.  Her point-of-care A1c 7.5%, overall improving from 10%.  She did not document or report hypoglycemia.     ) An ACE inhibitor/angiotensin II receptor blocker is being taken.  Hyperlipidemia This is a chronic problem. Exacerbating diseases include diabetes. Pertinent negatives include no chest pain, myalgias or shortness of breath. Current antihyperlipidemic treatment includes statins. Risk factors for coronary artery disease include diabetes mellitus, dyslipidemia, family history, hypertension, post-menopausal and a sedentary lifestyle.  Hypertension This is a chronic problem. The current episode started more than 1 year ago. Pertinent negatives include no chest pain, headaches, palpitations or shortness of breath. Risk factors for coronary artery disease include diabetes mellitus, dyslipidemia, sedentary lifestyle and post-menopausal state.     Review of Systems  Constitutional: Negative for chills, fever and unexpected weight change.  HENT: Negative for trouble swallowing and voice change.   Eyes: Negative for visual disturbance.  Respiratory: Negative for cough, shortness of breath and wheezing.   Cardiovascular: Negative for chest pain, palpitations and leg swelling.   Gastrointestinal: Negative for diarrhea, nausea and vomiting.  Endocrine: Negative for cold intolerance, heat intolerance, polydipsia, polyphagia and polyuria.  Musculoskeletal: Negative for arthralgias and myalgias.  Skin: Negative for color change, pallor, rash and wound.  Neurological: Negative for seizures and headaches.  Psychiatric/Behavioral: Negative for confusion and suicidal ideas.    Objective:    Vitals with BMI 11/09/2020 11/09/2020 07/06/2020  Height 5\' 4"  5' 1.5" -  Weight 213 lbs 213 lbs 13 oz 216 lbs  BMI 36.54 25.05 39.76  Systolic - 734 -  Diastolic - 82 -  Pulse - 92 -    BP (!) 150/82   Pulse 92   Ht 5' 1.5" (1.562 m)   Wt 213 lb 12.8 oz (97 kg)   BMI 39.74 kg/m   Wt Readings from Last 3 Encounters:  11/09/20 213 lb (96.6 kg)  11/09/20 213 lb 12.8 oz (97 kg)  07/06/20 216 lb (98 kg)     Physical Exam Constitutional:      Appearance: She is well-developed.  HENT:     Head: Normocephalic and atraumatic.  Neck:     Thyroid: No thyromegaly.     Trachea: No tracheal deviation.  Cardiovascular:     Rate and Rhythm: Normal rate and regular rhythm.  Pulmonary:     Effort: Pulmonary effort is normal.  Abdominal:     Tenderness: There is no abdominal tenderness. There is no guarding.  Musculoskeletal:        General: Normal range of motion.     Cervical back: Normal range of motion and neck supple.     Comments: Walks with a cane.  Skin:    General: Skin is warm and dry.     Coloration: Skin is not pale.     Findings: No erythema or rash.  Neurological:     Mental Status: She is alert and oriented to person, place, and time.     Cranial Nerves: No cranial nerve deficit.     Coordination: Coordination normal.     Deep Tendon Reflexes: Reflexes are normal and symmetric.  Psychiatric:        Judgment: Judgment normal.       CMP ( most recent) CMP     Component Value Date/Time   NA 140 06/30/2020 1527   K 4.1 06/30/2020 1527   CL 105  06/30/2020 1527   CO2 27 06/30/2020 1527   GLUCOSE 166 (H) 06/30/2020 1527  BUN 14 06/30/2020 1527   CREATININE 0.73 06/30/2020 1527   CALCIUM 8.6 06/30/2020 1527   PROT 6.7 06/30/2020 1527   ALBUMIN 3.1 (L) 02/22/2017 2141   AST 10 06/30/2020 1527   ALT 8 06/30/2020 1527   ALKPHOS 57 02/22/2017 2141   BILITOT 0.2 06/30/2020 1527   GFRNONAA 79 06/30/2020 1527   GFRAA 91 06/30/2020 1527     Diabetic Labs (most recent): Lab Results  Component Value Date   HGBA1C 7.5 (A) 11/09/2020   HGBA1C 7.7 (A) 07/06/2020   HGBA1C 7.7 (A) 03/10/2020    Lipid Panel     Component Value Date/Time   CHOL 120 11/06/2019 0810   TRIG 66 11/06/2019 0810   HDL 43 (L) 11/06/2019 0810   CHOLHDL 2.8 11/06/2019 0810   LDLCALC 62 11/06/2019 0810      Lab Results  Component Value Date   TSH 0.88 06/30/2020   TSH 0.75 11/06/2019   TSH 0.573 11/01/2018   TSH 0.150 (L) 02/22/2017   TSH 0.713 11/23/2015   FREET4 1.2 06/30/2020      Assessment & Plan:   1. Uncontrolled type 2 diabetes mellitus with hyperglycemia (HCC)   - Natalie Castro has currently uncontrolled symptomatic type 2 DM since  79 years of age.  She brought her meter showing controlled fasting glycemic profile averaging 156.  Her point-of-care A1c 7.5%, overall improving from 10%.  She did not document or report hypoglycemia.  - Recent labs reviewed. - I had a long discussion with her about the progressive nature of diabetes and the pathology behind its complications. -her diabetes is complicated by coronary artery disease, retinopathy, deconditioning/sedentary life, obesity and she remains at a high risk for more acute and chronic complications which include CAD, CVA, CKD, retinopathy, and neuropathy. These are all discussed in detail with her.  - I have counseled her on diet  and weight management  by adopting a carbohydrate restricted/protein rich diet. Patient is encouraged to switch to  unprocessed or minimally processed      complex starch and increased protein intake (animal or plant source), fruits, and vegetables. -  she is advised to stick to a routine mealtimes to eat 3 meals  a day and avoid unnecessary snacks ( to snack only to correct hypoglycemia).   - she  admits there is a room for improvement in her diet and drink choices. -  Suggestion is made for her to avoid simple carbohydrates  from her diet including Cakes, Sweet Desserts / Pastries, Ice Cream, Soda (diet and regular), Sweet Tea, Candies, Chips, Cookies, Sweet Pastries,  Store Bought Juices, Alcohol in Excess of  1-2 drinks a day, Artificial Sweeteners, Coffee Creamer, and "Sugar-free" Products. This will help patient to have stable blood glucose profile and potentially avoid unintended weight gain.   - she will be scheduled with Jearld Fenton, RDN, CDE for diabetes education.  - I have approached her with the following individualized plan to manage  her diabetes and patient agrees:   - she will continue to benefit and respond to at least basal insulin.  She is advised to continue Lantus 30 units nightly, associated with monitoring of blood glucose twice a day daily before breakfast and at bedtime.    In light of her presentation with near target A1c of 7.5%, she will not need prandial insulin for now.    - she is encouraged to call clinic for blood glucose levels less than 70 or above 300 mg /dl. -  she is advised to continue Metformin  1000 mg p.o. twice daily, therapeutically suitable for patient .  - she will be considered for incretin therapy as appropriate next visit.  - Specific targets for  A1c;  LDL, HDL,  and Triglycerides were discussed with the patient.  2) Blood Pressure /Hypertension: Her blood pressure slightly above target.   she is advised to continue her current medications including Procardia XL/Nifedical XL  30mg  p.o. daily with breakfast .  She is also on Cozaar 100 mg p.o. daily.  3) Lipids/Hyperlipidemia:   Review of her  recent lipid panel showed  controlled  LDL at 62 .  she  is advised to continue atorvastatin 40 mg p.o. daily at bedtime .  Side effects and precautions discussed with her.  4)  Weight/Diet:  Body mass index is 39.74 kg/m.  -   clearly complicating her diabetes care.   she is  a candidate for weight loss. I discussed with her the fact that loss of 5 - 10% of her  current body weight will have the most impact on her diabetes management.  Exercise, and detailed carbohydrates information provided  -  detailed on discharge instructions.  5) Chronic Care/Health Maintenance:  -she  is on ACEI/ARB and Statin medications and  is encouraged to initiate and continue to follow up with Ophthalmology, Dentist,  Podiatrist at least yearly or according to recommendations, and advised to   stay away from smoking. I have recommended yearly flu vaccine and pneumonia vaccine at least every 5 years; moderate intensity exercise for up to 150 minutes weekly; and  sleep for at least 7 hours a day.  - she is  advised to maintain close follow up with [name], MD for primary care needs, as well as her other providers for optimal and coordinated care.  - Time spent on this patient care encounter:  35 min, of which > 50% was spent in  counseling and the rest reviewing her blood glucose logs , discussing her hypoglycemia and hyperglycemia episodes, reviewing her current and  previous labs / studies  ( including abstraction from other facilities) and medications  doses and developing a  long term treatment plan and documenting her care.   Please refer to Patient Instructions for Blood Glucose Monitoring and Insulin/Medications Dosing Guide"  in media tab for additional information. Please  also refer to " Patient Self Inventory" in the Media  tab for reviewed elements of pertinent patient history.  Yolonda Kida participated in the discussions, expressed understanding, and voiced agreement with the above plans.  All questions  were answered to her satisfaction. she is encouraged to contact clinic should she have any questions or concerns prior to her return visit.   Follow up plan: - Return in about 6 months (around 05/09/2021) for Bring Meter and Logs- A1c in Office.  Glade Lloyd, MD [facility] [address] [place], [place] [address] Phone: [phone]  Fax: [phone]    11/09/2020, 2:08 PM  This note was partially dictated with voice recognition software. Similar sounding words can be transcribed inadequately or may not  be corrected upon review.

## 2020-11-09 NOTE — Patient Instructions (Addendum)
Goals  Keep eating foods out of the garden. Increase water intake. Get A1C to 7%. Stay active as much as possible.  Lose 5 lbs by next visit.

## 2020-11-09 NOTE — Patient Instructions (Signed)

## 2020-11-11 DIAGNOSIS — Z950 Presence of cardiac pacemaker: Secondary | ICD-10-CM | POA: Diagnosis not present

## 2020-11-11 DIAGNOSIS — Z86718 Personal history of other venous thrombosis and embolism: Secondary | ICD-10-CM | POA: Diagnosis not present

## 2020-11-11 DIAGNOSIS — E782 Mixed hyperlipidemia: Secondary | ICD-10-CM | POA: Diagnosis not present

## 2020-11-11 DIAGNOSIS — E118 Type 2 diabetes mellitus with unspecified complications: Secondary | ICD-10-CM | POA: Diagnosis not present

## 2020-11-18 ENCOUNTER — Ambulatory Visit (INDEPENDENT_AMBULATORY_CARE_PROVIDER_SITE_OTHER): Payer: Medicare Other

## 2020-11-18 DIAGNOSIS — I442 Atrioventricular block, complete: Secondary | ICD-10-CM

## 2020-11-18 LAB — CUP PACEART REMOTE DEVICE CHECK
Battery Remaining Longevity: 127 mo
Battery Remaining Percentage: 95.5 %
Battery Voltage: 3.01 V
Brady Statistic AP VP Percent: 1 %
Brady Statistic AP VS Percent: 1.3 %
Brady Statistic AS VP Percent: 10 %
Brady Statistic AS VS Percent: 88 %
Brady Statistic RA Percent Paced: 1.2 %
Brady Statistic RV Percent Paced: 10 %
Date Time Interrogation Session: 20211124020014
Implantable Lead Implant Date: 20161129
Implantable Lead Implant Date: 20161129
Implantable Lead Location: 753859
Implantable Lead Location: 753860
Implantable Lead Model: 5076
Implantable Pulse Generator Implant Date: 20161129
Lead Channel Impedance Value: 360 Ohm
Lead Channel Impedance Value: 490 Ohm
Lead Channel Pacing Threshold Amplitude: 0.75 V
Lead Channel Pacing Threshold Amplitude: 0.875 V
Lead Channel Pacing Threshold Pulse Width: 0.4 ms
Lead Channel Pacing Threshold Pulse Width: 0.4 ms
Lead Channel Sensing Intrinsic Amplitude: 4.3 mV
Lead Channel Sensing Intrinsic Amplitude: 8.4 mV
Lead Channel Setting Pacing Amplitude: 1.875
Lead Channel Setting Pacing Amplitude: 2 V
Lead Channel Setting Pacing Pulse Width: 0.4 ms
Lead Channel Setting Sensing Sensitivity: 2 mV
Pulse Gen Model: 2240
Pulse Gen Serial Number: 7826037

## 2020-11-27 NOTE — Progress Notes (Signed)
Remote pacemaker transmission.   

## 2020-11-30 ENCOUNTER — Other Ambulatory Visit: Payer: Self-pay

## 2020-11-30 ENCOUNTER — Ambulatory Visit: Payer: Medicare Other | Admitting: Podiatry

## 2020-11-30 ENCOUNTER — Encounter: Payer: Self-pay | Admitting: Podiatry

## 2020-11-30 DIAGNOSIS — B351 Tinea unguium: Secondary | ICD-10-CM | POA: Diagnosis not present

## 2020-11-30 DIAGNOSIS — M2011 Hallux valgus (acquired), right foot: Secondary | ICD-10-CM | POA: Diagnosis not present

## 2020-11-30 DIAGNOSIS — L84 Corns and callosities: Secondary | ICD-10-CM

## 2020-11-30 DIAGNOSIS — M2042 Other hammer toe(s) (acquired), left foot: Secondary | ICD-10-CM

## 2020-11-30 DIAGNOSIS — E119 Type 2 diabetes mellitus without complications: Secondary | ICD-10-CM

## 2020-11-30 DIAGNOSIS — M2041 Other hammer toe(s) (acquired), right foot: Secondary | ICD-10-CM

## 2020-11-30 DIAGNOSIS — M2012 Hallux valgus (acquired), left foot: Secondary | ICD-10-CM

## 2020-11-30 DIAGNOSIS — M79675 Pain in left toe(s): Secondary | ICD-10-CM | POA: Diagnosis not present

## 2020-11-30 DIAGNOSIS — M79674 Pain in right toe(s): Secondary | ICD-10-CM

## 2020-11-30 DIAGNOSIS — E1165 Type 2 diabetes mellitus with hyperglycemia: Secondary | ICD-10-CM | POA: Diagnosis not present

## 2020-11-30 NOTE — Progress Notes (Signed)
ANNUAL DIABETIC FOOT EXAM  Subjective: Natalie Castro presents today for for annual diabetic foot examination and painful thick toenails that are difficult to trim. Pain interferes with ambulation. Aggravating factors include wearing enclosed shoe gear. Pain is relieved with periodic professional debridement..  Patient relates 20 year h/o diabetes.  Patient denies any h/o foot wounds.  Patient relates occasional symptoms of foot numbness.  Patient denies symptoms of foot tingling.  Patient denies symptoms of burning in feet.  Patient did not check blood glucose this morning.Patient's blood sugar was 120-something mg/d yesterday morning.   [name], MD is patient's PCP. Last visit was 07/14/2020. Patient's endocrinologist is Dr. Bud Face and last visit was 11/09/2020.  Past Medical History:  Diagnosis Date  . Acid reflux   . Arthritis   . Colon adenomas    AGE 5  . Complete heart block (HCC)    STJ PPM Dr. [name] 11/24/15  . Essential hypertension   . Hyperlipidemia   . Presence of permanent cardiac pacemaker   . Type 2 diabetes mellitus [facility])    Patient Active Problem List   Diagnosis Date Noted  . Uncontrolled type 2 diabetes mellitus with hyperglycemia ([place]) 04/06/2020  . Mixed hyperlipidemia 11/04/2019  . Need for immunization against influenza 11/04/2019  . Positive FIT (fecal immunochemical test) 04/26/2018  . CAP (community acquired pneumonia) 02/22/2017  . Saddle pulmonary embolus ([place]) 11/04/2016  . Elevated troponin I level 11/04/2016  . Complete heart block ([place]) 11/23/2015  . KNEE, ARTHRITIS, 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  . KNEE PAIN 07/20/2009  . Essential hypertension, benign 07/20/2009   Past Surgical History:  Procedure Laterality Date  . COLONOSCOPY     3 SIMPLE ADENOMAS, AGE 9  . COLONOSCOPY N/A 05/24/2018   Procedure: COLONOSCOPY;  Surgeon: Danie Binder, MD;  [facility];  Service: Endoscopy;  Laterality: N/A;  1:00pm  . EP IMPLANTABLE  DEVICE N/A 11/24/2015   Procedure: Pacemaker Implant;  Surgeon: [name], MD;  Location: [facility];  Service: Cardiovascular;  Laterality: N/A;  . JOINT REPLACEMENT     knees bilat.  Marland Kitchen POLYPECTOMY  05/24/2018   Procedure: POLYPECTOMY;  Surgeon: Danie Binder, MD;  [facility];  Service: Endoscopy;;  ascending and hepatic flexure, transverse  . TUBAL LIGATION     Current Outpatient Medications on File Prior to Visit  Medication Sig Dispense Refill  . atorvastatin (LIPITOR) 40 MG tablet TAKE (1) TABLET BY MOUTH AT BEDTIME. 30 tablet 0  . ELIQUIS 5 MG TABS tablet TAKE 1 TABLET BY MOUTH TWICE A DAY. 60 tablet 0  . glucose blood test strip Use to check blood glucose fasting , before lunch and dinner and after your largest meal 120 each 12  . hydrALAZINE (APRESOLINE) 50 MG tablet Take 50 mg by mouth 2 (two) times daily.    . Lancets (ONETOUCH ULTRASOFT) lancets Use to check glucose 4 x daily 120 each 12  . LANTUS SOLOSTAR 100 UNIT/ML Solostar Pen Inject 30 Units into the skin at bedtime. 10 pen 2  . losartan (COZAAR) 100 MG tablet TAKE (1) TABLET BY MOUTH ONCE DAILY. 30 tablet 0  . magnesium gluconate (MAGONATE) 500 MG tablet Take 1 tablet (500 mg total) by mouth daily. 90 tablet 3  . Magnesium Oxide 500 MG TABS TAKE 1 TABLET BY MOUTH ONCE A DAY. 30 tablet 11  . metFORMIN (GLUCOPHAGE) 1000 MG tablet TAKE (1) TABLET BY MOUTH TWICE DAILY. 60 tablet 0  . metoprolol  succinate (TOPROL-XL) 25 MG 24 hr tablet Take 25 mg by mouth daily.    Marland Kitchen NIFEdipine (PROCARDIA-XL/NIFEDICAL-XL) 30 MG 24 hr tablet TAKE 1 TABLET BY MOUTH ONCE A DAY. 30 tablet 0   No current facility-administered medications on file prior to visit.    Allergies  Allergen Reactions  . Penicillins Hives, Itching and Other (See Comments)    Has patient had a PCN reaction causing immediate rash, facial/tongue/throat swelling, SOB or lightheadedness with hypotension: No Has patient had a PCN reaction causing severe  rash involving mucus membranes or skin necrosis: Yes Has patient had a PCN reaction that required hospitalization No Has patient had a PCN reaction occurring within the last 10 years: No If all of the above answers are "NO", then may proceed with Cephalosporin use.    Social History   Occupational History  . Occupation: retired  Tobacco Use  . Smoking status: Never Smoker  . Smokeless tobacco: Never Used  Vaping Use  . Vaping Use: Never used  Substance and Sexual Activity  . Alcohol use: No    Alcohol/week: 0.0 standard drinks  . Drug use: No  . Sexual activity: Yes    Birth control/protection: Post-menopausal   Family History  Problem Relation Age of Onset  . Heart failure Mother   . Hypertension Mother   . Heart failure Sister   . Hypertension Father   . Cancer Sister   . Dementia Sister   . Colon cancer Neg Hx   . Colon polyps Neg Hx    Immunization History  Administered Date(s) Administered  . Fluad Quad(high Dose 65+) 11/04/2019  . Influenza, Seasonal, Injecte, Preservative Fre 09/25/2017  . Influenza-Unspecified 10/26/2018  . Moderna SARS-COVID-2 Vaccination 02/25/2020, 03/24/2020  . Pneumococcal Polysaccharide-23 02/24/2017     Review of Systems: Negative except as noted in the HPI.  Objective: There were no vitals filed for this visit.  Natalie Castro is a pleasant 79 y.o. female, morbidly obese in NAD. AAO X 3.  Vascular Examination: Capillary refill time to digits immediate b/l. Faintly palpable pedal pulses b/l. Pedal hair absent. Lower extremity skin temperature gradient within normal limits. No pain with calf compression b/l. Nonpitting edema noted b/l lower extremities.  Dermatological Examination: Pedal skin with normal turgor, texture and tone bilaterally. No open wounds bilaterally. No interdigital macerations bilaterally. Toenails 1-5 b/l elongated, discolored, dystrophic, thickened, crumbly with subungual debris and tenderness to dorsal palpation.  Hyperkeratotic lesion(s) R hallux.  No erythema, no edema, no drainage, no fluctuance.  Musculoskeletal Examination: Normal muscle strength 5/5 to all lower extremity muscle groups bilaterally. No pain crepitus or joint limitation noted with ROM b/l. 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 Hammertoes noted to the L 5th toe and R 5th toe. Utilizes rollator for ambulation assistance.  Footwear Assessment: Does the patient wear appropriate shoes? Yes. Does the patient need inserts/orthotics? No.  Neurological Examination: Pt has subjective symptoms of neuropathy. Protective sensation intact 5/5 intact bilaterally with 10g monofilament b/l. Vibratory sensation intact b/l.  Hemoglobin A1C Latest Ref Rng & Units 11/09/2020 07/06/2020 03/10/2020  HGBA1C 4.0 - 5.6 % 7.5(A) 7.7(A) 7.7(A)  Some recent data might be hidden   Assessment: 1. Pain due to onychomycosis of toenails of both feet   2. Callus   3. Hallux valgus, acquired, bilateral   4. Acquired hammertoes of both feet   5. Uncontrolled type 2 diabetes mellitus with hyperglycemia ([place])   6. Encounter for diabetic foot exam ([place])  ADA Risk Categorization: Low Risk :  Patient has all of the following: Intact protective sensation No prior foot ulcer  No severe deformity Pedal pulses present  Plan: -Examined patient. -Diabetic foot examination performed on today's visit. -Continue diabetic foot care principles. -Patient to continue soft, supportive shoe gear daily. -Toenails 1-5 b/l were debrided in length and girth with sterile nail nippers and dremel without iatrogenic bleeding.  -Callus(es) R hallux pared utilizing sterile scalpel blade without complication or incident. Total number debrided =1. -Patient to report any pedal injuries to medical professional immediately. -Patient/POA to call should there be question/concern in the interim.  Return in about 3 months (around 02/28/2021) for diabetic foot  care.  Marzetta Board, DPM

## 2020-12-23 ENCOUNTER — Ambulatory Visit: Payer: Medicare Other | Admitting: Internal Medicine

## 2020-12-23 ENCOUNTER — Encounter: Payer: Self-pay | Admitting: Internal Medicine

## 2020-12-23 ENCOUNTER — Other Ambulatory Visit: Payer: Self-pay

## 2020-12-23 VITALS — BP 138/90 | HR 95 | Ht 65.0 in | Wt 216.0 lb

## 2020-12-23 DIAGNOSIS — I442 Atrioventricular block, complete: Secondary | ICD-10-CM | POA: Diagnosis not present

## 2020-12-23 DIAGNOSIS — Z950 Presence of cardiac pacemaker: Secondary | ICD-10-CM

## 2020-12-23 DIAGNOSIS — I472 Ventricular tachycardia, unspecified: Secondary | ICD-10-CM

## 2020-12-23 LAB — CUP PACEART INCLINIC DEVICE CHECK
Battery Remaining Longevity: 138 mo
Battery Voltage: 3.01 V
Brady Statistic RA Percent Paced: 1.1 %
Brady Statistic RV Percent Paced: 9.9 %
Date Time Interrogation Session: 20211229160150
Implantable Lead Implant Date: 20161129
Implantable Lead Implant Date: 20161129
Implantable Lead Location: 753859
Implantable Lead Location: 753860
Implantable Lead Model: 5076
Implantable Pulse Generator Implant Date: 20161129
Lead Channel Impedance Value: 375 Ohm
Lead Channel Impedance Value: 487.5 Ohm
Lead Channel Pacing Threshold Amplitude: 0.75 V
Lead Channel Pacing Threshold Amplitude: 0.875 V
Lead Channel Pacing Threshold Pulse Width: 0.4 ms
Lead Channel Pacing Threshold Pulse Width: 0.4 ms
Lead Channel Sensing Intrinsic Amplitude: 4.4 mV
Lead Channel Sensing Intrinsic Amplitude: 8.5 mV
Lead Channel Setting Pacing Amplitude: 1.875
Lead Channel Setting Pacing Amplitude: 2 V
Lead Channel Setting Pacing Pulse Width: 0.4 ms
Lead Channel Setting Sensing Sensitivity: 2 mV
Pulse Gen Model: 2240
Pulse Gen Serial Number: 7826037

## 2020-12-23 NOTE — Patient Instructions (Signed)
Medication Instructions:  Your physician recommends that you continue on your current medications as directed. Please refer to the Current Medication list given to you today.  *If you need a refill on your cardiac medications before your next appointment, please call your pharmacy*   Lab Work: NONE   If you have labs (blood work) drawn today and your tests are completely normal, you will receive your results only by: . MyChart Message (if you have MyChart) OR . A paper copy in the mail If you have any lab test that is abnormal or we need to change your treatment, we will call you to review the results.   Testing/Procedures: NONE    Follow-Up: At [facility], you and your health needs are our priority.  As part of our continuing mission to provide you with exceptional heart care, we have created designated Provider Care Teams.  These Care Teams include your primary Cardiologist (physician) and Advanced Practice Providers (APPs -  Physician Assistants and Nurse Practitioners) who all work together to provide you with the care you need, when you need it.  We recommend signing up for the patient portal called "MyChart".  Sign up information is provided on this After Visit Summary.  MyChart is used to connect with patients for Virtual Visits (Telemedicine).  Patients are able to view lab/test results, encounter notes, upcoming appointments, etc.  Non-urgent messages can be sent to your provider as well.   To learn more about what you can do with MyChart, go to [email or web address].    Your next appointment:   1 year(s)  The format for your next appointment:   In Person  Provider:   Gregg Taylor, MD   Other Instructions Thank you for choosing [facility]!    

## 2020-12-23 NOTE — Progress Notes (Signed)
HPI Natalie Castro presents today for ongoing evaluation and management of her PPM. She is a pleasant 79yo woman with a h/o HTN and DM who developed syncope and sob and was found to have CHB, and underwent PPM insertion. She did not take her meds this morning. She was hospitalized remotely with pulmonary emboli.No chest pain. No bleeding. She admits to dietary indiscretion. Allergies  Allergen Reactions   Penicillins Hives, Itching and Other (See Comments)    Has patient had a PCN reaction causing immediate rash, facial/tongue/throat swelling, SOB or lightheadedness with hypotension: No Has patient had a PCN reaction causing severe rash involving mucus membranes or skin necrosis: Yes Has patient had a PCN reaction that required hospitalization No Has patient had a PCN reaction occurring within the last 10 years: No If all of the above answers are "NO", then may proceed with Cephalosporin use.      Current Outpatient Medications  Medication Sig Dispense Refill   atorvastatin (LIPITOR) 40 MG tablet TAKE (1) TABLET BY MOUTH AT BEDTIME. 30 tablet 0   ELIQUIS 5 MG TABS tablet TAKE 1 TABLET BY MOUTH TWICE A DAY. 60 tablet 0   glucose blood test strip Use to check blood glucose fasting , before lunch and dinner and after your largest meal 120 each 12   hydrALAZINE (APRESOLINE) 50 MG tablet Take 50 mg by mouth 2 (two) times daily.     Lancets (ONETOUCH ULTRASOFT) lancets Use to check glucose 4 x daily 120 each 12   LANTUS SOLOSTAR 100 UNIT/ML Solostar Pen Inject 30 Units into the skin at bedtime. 10 pen 2   losartan (COZAAR) 100 MG tablet TAKE (1) TABLET BY MOUTH ONCE DAILY. 30 tablet 0   magnesium gluconate (MAGONATE) 500 MG tablet Take 1 tablet (500 mg total) by mouth daily. 90 tablet 3   Magnesium Oxide 500 MG TABS TAKE 1 TABLET BY MOUTH ONCE A DAY. 30 tablet 11   metFORMIN (GLUCOPHAGE) 1000 MG tablet TAKE (1) TABLET BY MOUTH TWICE DAILY. 60 tablet 0   metoprolol succinate  (TOPROL-XL) 25 MG 24 hr tablet Take 25 mg by mouth daily.     NIFEdipine (PROCARDIA-XL/NIFEDICAL-XL) 30 MG 24 hr tablet TAKE 1 TABLET BY MOUTH ONCE A DAY. 30 tablet 0   No current facility-administered medications for this visit.     Past Medical History:  Diagnosis Date   Acid reflux    Arthritis    Colon adenomas    AGE 43   Complete heart block (HCC)    STJ PPM Dr. Johney Frame 11/24/15   Essential hypertension    Hyperlipidemia    Presence of permanent cardiac pacemaker    Type 2 diabetes mellitus (HCC)     ROS:   All systems reviewed and negative except as noted in the HPI.   Past Surgical History:  Procedure Laterality Date   COLONOSCOPY     3 SIMPLE ADENOMAS, AGE 43   COLONOSCOPY N/A 05/24/2018   Procedure: COLONOSCOPY;  Surgeon: West Bali, MD;  [facility];  Service: Endoscopy;  Laterality: N/A;  1:00pm   EP IMPLANTABLE DEVICE N/A 11/24/2015   Procedure: Pacemaker Implant;  Surgeon: Hillis Range, MD;  Location: [facility] INVASIVE CV LAB;  Service: Cardiovascular;  Laterality: N/A;   JOINT REPLACEMENT     knees bilat.   POLYPECTOMY  05/24/2018   Procedure: POLYPECTOMY;  Surgeon: West Bali, MD;  [facility];  Service: Endoscopy;;  ascending and hepatic flexure,  transverse   TUBAL LIGATION       Family History  Problem Relation Age of Onset   Heart failure Mother    Hypertension Mother    Heart failure Sister    Hypertension Father    Cancer Sister    Dementia Sister    Colon cancer Neg Hx    Colon polyps Neg Hx      Social History   Socioeconomic History   Marital status: Married    Spouse name: Not on file   Number of children: Not on file   Years of education: Not on file   Highest education level: Not on file  Occupational History   Occupation: retired  Tobacco Use   Smoking status: Never Smoker   Smokeless tobacco: Never Used  Scientific laboratory technician Use: Never used  Substance and Sexual  Activity   Alcohol use: No    Alcohol/week: 0.0 standard drinks   Drug use: No   Sexual activity: Yes    Birth control/protection: Post-menopausal  Other Topics Concern   Not on file  Social History Narrative   MARRIED FOR 77 YRS. 5 KIDS: #4 PRESENT TODAY(AGE 74)   Social Determinants of Health   Financial Resource Strain: Not on file  Food Insecurity: Not on file  Transportation Needs: Not on file  Physical Activity: Not on file  Stress: Not on file  Social Connections: Not on file  Intimate Partner Violence: Not on file     BP 138/90    Pulse 95    Ht 5\' 5"  (1.651 m)    Wt 216 lb (98 kg)    SpO2 98%    BMI 35.94 kg/m   Physical Exam:  Well appearing NAD HEENT: Unremarkable Neck:  No JVD, no thyromegally Lymphatics:  No adenopathy Back:  No CVA tenderness Lungs:  Clear HEART:  Regular rate rhythm, no murmurs, no rubs, no clicks Abd:  soft, positive bowel sounds, no organomegally, no rebound, no guarding Ext:  2 plus pulses, no edema, no cyanosis, no clubbing Skin:  No rashes no nodules Neuro:  CN II through XII intact, motor grossly intact  EKG - nsr with LBBB  DEVICE  Normal device function.  See PaceArt for details.   Assess/Plan: 1. Sinus node dysfunction - she is asymptomatic s/p PPM insertion. 2. PPM - her [employer] DDD PM is working normally. Will recheck in several months. 3. HTN - her blood pressure is controlled today. No change in her meds. 4. Obesity - I encouraged her to lose weight.  Carleene Overlie Dorethea Strubel,MD

## 2021-02-17 ENCOUNTER — Ambulatory Visit (INDEPENDENT_AMBULATORY_CARE_PROVIDER_SITE_OTHER): Payer: Medicare Other

## 2021-02-17 DIAGNOSIS — I442 Atrioventricular block, complete: Secondary | ICD-10-CM

## 2021-02-18 LAB — CUP PACEART REMOTE DEVICE CHECK
Battery Remaining Longevity: 128 mo
Battery Remaining Percentage: 95.5 %
Battery Voltage: 3.01 V
Brady Statistic AP VP Percent: 1 %
Brady Statistic AP VS Percent: 1 %
Brady Statistic AS VP Percent: 1 %
Brady Statistic AS VS Percent: 99 %
Brady Statistic RA Percent Paced: 1 %
Brady Statistic RV Percent Paced: 1 %
Date Time Interrogation Session: 20220223022032
Implantable Lead Implant Date: 20161129
Implantable Lead Implant Date: 20161129
Implantable Lead Location: 753859
Implantable Lead Location: 753860
Implantable Lead Model: 5076
Implantable Pulse Generator Implant Date: 20161129
Lead Channel Impedance Value: 380 Ohm
Lead Channel Impedance Value: 450 Ohm
Lead Channel Pacing Threshold Amplitude: 0.75 V
Lead Channel Pacing Threshold Amplitude: 0.875 V
Lead Channel Pacing Threshold Pulse Width: 0.4 ms
Lead Channel Pacing Threshold Pulse Width: 0.4 ms
Lead Channel Sensing Intrinsic Amplitude: 4.7 mV
Lead Channel Sensing Intrinsic Amplitude: 8.4 mV
Lead Channel Setting Pacing Amplitude: 1.875
Lead Channel Setting Pacing Amplitude: 2 V
Lead Channel Setting Pacing Pulse Width: 0.4 ms
Lead Channel Setting Sensing Sensitivity: 2 mV
Pulse Gen Model: 2240
Pulse Gen Serial Number: 7826037

## 2021-02-23 ENCOUNTER — Encounter (HOSPITAL_COMMUNITY): Payer: Self-pay | Admitting: Emergency Medicine

## 2021-02-23 ENCOUNTER — Other Ambulatory Visit: Payer: Self-pay

## 2021-02-23 ENCOUNTER — Emergency Department (HOSPITAL_COMMUNITY): Payer: Medicare Other

## 2021-02-23 ENCOUNTER — Emergency Department (HOSPITAL_COMMUNITY)
Admission: EM | Admit: 2021-02-23 | Discharge: 2021-02-23 | Disposition: A | Discharge status: Home / Self Care | Payer: Medicare Other | Attending: Emergency Medicine | Admitting: Emergency Medicine

## 2021-02-23 DIAGNOSIS — Z794 Long term (current) use of insulin: Secondary | ICD-10-CM | POA: Insufficient documentation

## 2021-02-23 DIAGNOSIS — I10 Essential (primary) hypertension: Secondary | ICD-10-CM | POA: Diagnosis not present

## 2021-02-23 DIAGNOSIS — R079 Chest pain, unspecified: Secondary | ICD-10-CM | POA: Diagnosis present

## 2021-02-23 DIAGNOSIS — E1165 Type 2 diabetes mellitus with hyperglycemia: Secondary | ICD-10-CM | POA: Diagnosis not present

## 2021-02-23 DIAGNOSIS — Z7984 Long term (current) use of oral hypoglycemic drugs: Secondary | ICD-10-CM | POA: Diagnosis not present

## 2021-02-23 DIAGNOSIS — Z7901 Long term (current) use of anticoagulants: Secondary | ICD-10-CM | POA: Diagnosis not present

## 2021-02-23 DIAGNOSIS — Z96653 Presence of artificial knee joint, bilateral: Secondary | ICD-10-CM | POA: Insufficient documentation

## 2021-02-23 DIAGNOSIS — Z86711 Personal history of pulmonary embolism: Secondary | ICD-10-CM | POA: Insufficient documentation

## 2021-02-23 DIAGNOSIS — R072 Precordial pain: Secondary | ICD-10-CM

## 2021-02-23 DIAGNOSIS — Z79899 Other long term (current) drug therapy: Secondary | ICD-10-CM | POA: Diagnosis not present

## 2021-02-23 LAB — CBC WITH DIFFERENTIAL/PLATELET
Abs Immature Granulocytes: 0.03 10*3/uL (ref 0.00–0.07)
Basophils Absolute: 0 10*3/uL (ref 0.0–0.1)
Basophils Relative: 0 %
Eosinophils Absolute: 0.1 10*3/uL (ref 0.0–0.5)
Eosinophils Relative: 1 %
HCT: 42.2 % (ref 36.0–46.0)
Hemoglobin: 13 g/dL (ref 12.0–15.0)
Immature Granulocytes: 0 %
Lymphocytes Relative: 30 %
Lymphs Abs: 2.2 10*3/uL (ref 0.7–4.0)
MCH: 29.7 pg (ref 26.0–34.0)
MCHC: 30.8 g/dL (ref 30.0–36.0)
MCV: 96.6 fL (ref 80.0–100.0)
Monocytes Absolute: 0.6 10*3/uL (ref 0.1–1.0)
Monocytes Relative: 9 %
Neutro Abs: 4.4 10*3/uL (ref 1.7–7.7)
Neutrophils Relative %: 60 %
Platelets: 201 10*3/uL (ref 150–400)
RBC: 4.37 MIL/uL (ref 3.87–5.11)
RDW: 14.6 % (ref 11.5–15.5)
WBC: 7.3 10*3/uL (ref 4.0–10.5)
nRBC: 0 % (ref 0.0–0.2)

## 2021-02-23 LAB — D-DIMER, QUANTITATIVE: D-Dimer, Quant: 0.57 ug/mL-FEU — ABNORMAL HIGH (ref 0.00–0.50)

## 2021-02-23 LAB — COMPREHENSIVE METABOLIC PANEL
ALT: 11 U/L (ref 0–44)
AST: 14 U/L — ABNORMAL LOW (ref 15–41)
Albumin: 3.6 g/dL (ref 3.5–5.0)
Alkaline Phosphatase: 63 U/L (ref 38–126)
Anion gap: 9 (ref 5–15)
BUN: 19 mg/dL (ref 8–23)
CO2: 23 mmol/L (ref 22–32)
Calcium: 9.5 mg/dL (ref 8.9–10.3)
Chloride: 107 mmol/L (ref 98–111)
Creatinine, Ser: 0.72 mg/dL (ref 0.44–1.00)
GFR, Estimated: >60 mL/min (ref >60)
Glucose, Bld: 139 mg/dL — ABNORMAL HIGH (ref 70–99)
Potassium: 4.2 mmol/L (ref 3.5–5.1)
Sodium: 139 mmol/L (ref 135–145)
Total Bilirubin: 0.3 mg/dL (ref 0.3–1.2)
Total Protein: 7.8 g/dL (ref 6.5–8.1)

## 2021-02-23 LAB — TROPONIN I (HIGH SENSITIVITY)
Troponin I (High Sensitivity): 12 ng/L (ref <18)
Troponin I (High Sensitivity): 12 ng/L (ref <18)

## 2021-02-23 NOTE — ED Provider Notes (Addendum)
Patient states no chest discomfort or pain since about 12 noon today.  Started around midnight time frame.  And was constant.  Patient's troponins x2 both were 12.  D-dimer was 0.57.  But if you age correct this is not significantly elevated.  Patient has no shortness of breath.  Patient is followed by cardiology here in [place].  Patient states she will return for any recurrent chest pain or discomfort lasting 20 minutes or longer.  We will go ahead and discharge with cardiology follow-up.  Chest x-ray negative.  EKG without significant change.   Fredia Sorrow, MD 02/23/21 1650    Fredia Sorrow, MD 02/23/21 [phone]

## 2021-02-23 NOTE — Discharge Instructions (Addendum)
Call to make an appointment follow-up with cardiology here in [place].  Return for any recurrent chest pain or discomfort lasting 20 minutes or longer.  Today's work-up without any acute findings.

## 2021-02-23 NOTE — ED Provider Notes (Addendum)
[facility] EMERGENCY DEPARTMENT Provider Note   CSN: [id number] Arrival date & time: 02/23/21  1103     History Chief Complaint  Patient presents with  . Chest Pain    Natalie Castro is a 80 y.o. female.  Patient complains of left-sided chest pain off and on since last night.  No shortness of breath no sweating.  The history is provided by the patient and medical records. No language interpreter was used.  Chest Pain Pain location:  L chest Pain quality: aching   Pain radiates to:  Does not radiate Pain severity:  Mild Onset quality:  Sudden Associated symptoms: no abdominal pain, no back pain, no cough, no fatigue and no headache        Past Medical History:  Diagnosis Date  . Acid reflux   . Arthritis   . Colon adenomas    AGE 23  . Complete heart block (HCC)    STJ PPM Dr. [name] 11/24/15  . Essential hypertension   . Hyperlipidemia   . Presence of permanent cardiac pacemaker   . Type 2 diabetes mellitus [facility])     Patient Active Problem List   Diagnosis Date Noted  . Uncontrolled type 2 diabetes mellitus with hyperglycemia ([place]) 04/06/2020  . Mixed hyperlipidemia 11/04/2019  . Need for immunization against influenza 11/04/2019  . Positive FIT (fecal immunochemical test) 04/26/2018  . CAP (community acquired pneumonia) 02/22/2017  . 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 Elevated troponin I level 11/04/2016  . Complete heart block ([place]) 11/23/2015  . KNEE, ARTHRITIS, 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  . KNEE PAIN 07/20/2009  . Essential hypertension, benign 07/20/2009    Past Surgical History:  Procedure Laterality Date  . COLONOSCOPY     3 SIMPLE ADENOMAS, AGE 62  . COLONOSCOPY N/A 05/24/2018   Procedure: COLONOSCOPY;  Surgeon: Danie Binder, MD;  Location: [facility] ENDO SU[facility];  Service: Endoscopy;  Laterality: N/A;  1:00pm  . EP IMPLANTABLE DEVICE N/A 11/24/2015   Procedure: Pacemaker Implant;  Surgeon: [name], MD;  Location: [place] CV LAB;   Service: Cardiovascular;  Laterality: N/A;  . JOINT REPLACEMENT     knees bilat.  Marland Kitchen POLYPECTOMY  05/24/2018   Procedure: POLYPECTOMY;  Surgeon: Danie Binder, MD;  Location: [facility] ENDO SUITE;  Service: Endoscopy;;  ascending and hepatic flexure, transverse  . TUBAL LIGATION       OB History    Gravida  5   Para  5   Term  5   Preterm      AB      Living  4     SAB      IAB      Ectopic      Multiple      Live Births              Family History  Problem Relation Age of Onset  . Heart failure Mother   . Hypertension Mother   . Heart failure Sister   . Hypertension Father   . Cancer Sister   . Dementia Sister   . Colon cancer Neg Hx   . Colon polyps Neg Hx     Social History   Tobacco Use  . Smoking status: Never Smoker  . Smokeless tobacco: Never Used  Vaping Use  . Vaping Use: Never used  Substance Use Topics  . Alcohol use: No    Alcohol/week: 0.0 standard drinks  . Drug use: No    Home Medications Prior to  Admission medications   Medication Sig Start Date End Date Taking? Authorizing Provider  atorvastatin (LIPITOR) 40 MG tablet TAKE (1) TABLET BY MOUTH AT BEDTIME. Patient taking differently: Take 40 mg by mouth daily. 05/28/20   Corum, Rex Kras, MD  ELIQUIS 5 MG TABS tablet TAKE 1 TABLET BY MOUTH TWICE A DAY. Patient taking differently: Take 5 mg by mouth 2 (two) times daily. 05/05/20   Corum, Rex Kras, MD  glucose blood test strip Use to check blood glucose fasting , before lunch and dinner and after your largest meal 11/04/19   Corum, Rex Kras, MD  hydrALAZINE (APRESOLINE) 50 MG tablet Take 50 mg by mouth 2 (two) times daily.    [provider]  Lancets Glory Rosebush ULTRASOFT) lancets Use to check glucose 4 x daily 11/04/19   Corum, Rex Kras, MD  LANTUS SOLOSTAR 100 UNIT/ML Solostar Pen Inject 30 Units into the skin at bedtime. 07/06/20   Cassandria Anger, MD  losartan (COZAAR) 100 MG tablet TAKE (1) TABLET BY MOUTH ONCE DAILY. Patient taking  differently: Take 100 mg by mouth daily. 05/28/20   Corum, Rex Kras, MD  magnesium gluconate (MAGONATE) 500 MG tablet Take 1 tablet (500 mg total) by mouth daily. 11/05/18   Evans Lance, MD  Magnesium Oxide 500 MG TABS TAKE 1 TABLET BY MOUTH ONCE A DAY. Patient taking differently: Take 1 tablet by mouth daily. 11/04/19   Evans Lance, MD  metFORMIN (GLUCOPHAGE) 1000 MG tablet TAKE (1) TABLET BY MOUTH TWICE DAILY. Patient taking differently: Take 1,000 mg by mouth in the morning and at bedtime. 05/28/20   Corum, Rex Kras, MD  metoprolol succinate (TOPROL-XL) 25 MG 24 hr tablet Take 25 mg by mouth daily. 04/03/20   [provider]  NIFEdipine (PROCARDIA-XL/NIFEDICAL-XL) 30 MG 24 hr tablet TAKE 1 TABLET BY MOUTH ONCE A DAY. Patient taking differently: Take 30 mg by mouth daily. 05/28/20   Maryruth Hancock, MD    Allergies    Penicillins  Review of Systems   Review of Systems  Constitutional: Negative for appetite change and fatigue.  HENT: Negative for congestion, ear discharge and sinus pressure.   Eyes: Negative for discharge.  Respiratory: Negative for cough.   Cardiovascular: Positive for chest pain.  Gastrointestinal: Negative for abdominal pain and diarrhea.  Genitourinary: Negative for frequency and hematuria.  Musculoskeletal: Negative for back pain.  Skin: Negative for rash.  Neurological: Negative for seizures and headaches.  Psychiatric/Behavioral: Negative for hallucinations.    Physical Exam Updated Vital Signs BP (!) 176/90   Pulse 83   Temp 98.2 F (36.8 C)   Resp 18   Ht 5\' 5"  (1.651 m)   Wt 96.2 kg   SpO2 99%   BMI 35.28 kg/m   Physical Exam Vitals and nursing note reviewed.  Constitutional:      Appearance: She is well-developed.  HENT:     Head: Normocephalic.     Nose: Nose normal.  Eyes:     General: No scleral icterus.    Extraocular Movements: EOM normal.     Conjunctiva/sclera: Conjunctivae normal.  Neck:     Thyroid: No thyromegaly.   Cardiovascular:     Rate and Rhythm: Normal rate and regular rhythm.     Heart sounds: No murmur heard. No friction rub. No gallop.   Pulmonary:     Breath sounds: No stridor. No wheezing or rales.  Chest:     Chest wall: No tenderness.  Abdominal:  General: There is no distension.     Tenderness: There is no abdominal tenderness. There is no rebound.  Musculoskeletal:        General: No edema. Normal range of motion.     Cervical back: Neck supple.  Lymphadenopathy:     Cervical: No cervical adenopathy.  Skin:    Findings: No erythema or rash.  Neurological:     Mental Status: She is alert and oriented to person, place, and time.     Motor: No abnormal muscle tone.     Coordination: Coordination normal.  Psychiatric:        Mood and Affect: Mood and affect normal.        Behavior: Behavior normal.     ED Results / Procedures / Treatments   Labs (all labs ordered are listed, but only abnormal results are displayed) Labs Reviewed  COMPREHENSIVE METABOLIC PANEL - Abnormal; Notable for the following components:      Result Value   Glucose, Bld 139 (*)    AST 14 (*)    All other components within normal limits  CBC WITH DIFFERENTIAL/PLATELET  D-DIMER, QUANTITATIVE  TROPONIN I (HIGH SENSITIVITY)  TROPONIN I (HIGH SENSITIVITY)    EKG None  Radiology DG Chest Port 1 View  Result Date: 02/23/2021 CLINICAL DATA:  Chest pain, shortness of breath. EXAM: PORTABLE CHEST 1 VIEW COMPARISON:  February 22, 2017. FINDINGS: Stable cardiomegaly. Left-sided pacemaker is unchanged in position. No pneumothorax or pleural effusion is noted. Lungs are clear. Bony thorax is unremarkable. IMPRESSION: No active disease. Aortic Atherosclerosis (ICD10-I70.0). Electronically Signed   By: Marijo Conception M.D.   On: 02/23/2021 12:37    Procedures Procedures   Medications Ordered in ED Medications - No data to display  ED Course  I have reviewed the triage vital signs and the nursing  notes.  Pertinent labs & imaging results that were available during my care of the patient were reviewed by me and considered in my medical decision making (see chart for details).    MDM Rules/Calculators/A&P                        Patient with chest pain.  Troponin negative second troponin pending disposition will be done by Dr. Venita Sheffield  Final Clinical Impression(s) / ED Diagnoses Final diagnoses:  None    Rx / DC Orders ED Discharge Orders    None       Milton Ferguson, MD 02/25/21 1003    Milton Ferguson, MD 02/25/21 1004

## 2021-02-23 NOTE — ED Triage Notes (Signed)
Pt c/o left sided chest tightness and "not feeling right" that started last night. Pt also c/o pain under left breast

## 2021-02-24 ENCOUNTER — Other Ambulatory Visit: Payer: Self-pay

## 2021-02-24 ENCOUNTER — Ambulatory Visit: Payer: Medicare Other | Admitting: Physician Assistant

## 2021-02-24 ENCOUNTER — Encounter: Payer: Self-pay | Admitting: *Deleted

## 2021-02-24 ENCOUNTER — Encounter: Payer: Self-pay | Admitting: Physician Assistant

## 2021-02-24 VITALS — BP 134/82 | HR 100 | Ht 62.0 in | Wt 214.2 lb

## 2021-02-24 DIAGNOSIS — Z950 Presence of cardiac pacemaker: Secondary | ICD-10-CM | POA: Diagnosis not present

## 2021-02-24 DIAGNOSIS — I10 Essential (primary) hypertension: Secondary | ICD-10-CM

## 2021-02-24 DIAGNOSIS — R072 Precordial pain: Secondary | ICD-10-CM | POA: Diagnosis not present

## 2021-02-24 DIAGNOSIS — Z86711 Personal history of pulmonary embolism: Secondary | ICD-10-CM | POA: Diagnosis not present

## 2021-02-24 MED ORDER — NITROGLYCERIN 0.4 MG SL SUBL: 0.4000 mg | SUBLINGUAL_TABLET | SUBLINGUAL | 3 refills | Status: DC | PRN

## 2021-02-24 NOTE — Patient Instructions (Signed)
Medication Instructions:  Your physician has recommended you make the following change in your medication:  1.   If a single episode of chest pain is not relieved by one tablet, the patient will try another within 5 minutes; and if this doesn't relieve the pain, the patient will try another within 5 minutes and if this doesn't relieve the pain the patient is instructed to call 911 for transportation to an emergency department.     *If you need a refill on your cardiac medications before your next appointment, please call your pharmacy*   Lab Work: -None If you have labs (blood work) drawn today and your tests are completely normal, you will receive your results only by: Marland Kitchen MyChart Message (if you have MyChart) OR . A paper copy in the mail If you have any lab test that is abnormal or we need to change your treatment, we will call you to review the results.   Testing/Procedures: Your physician has requested that you have a lexiscan myoview. For further information please visit [email or web address]. Please follow instruction sheet, as given.      Follow-Up: At [facility], you and your health needs are our priority.  As part of our continuing mission to provide you with exceptional heart care, we have created designated Provider Care Teams.  These Care Teams include your primary Cardiologist (physician) and Advanced Practice Providers (APPs -  Physician Assistants and Nurse Practitioners) who all work together to provide you with the care you need, when you need it.  We recommend signing up for the patient portal called "MyChart".  Sign up information is provided on this After Visit Summary.  MyChart is used to connect with patients for Virtual Visits (Telemedicine).  Patients are able to view lab/test results, encounter notes, upcoming appointments, etc.  Non-urgent messages can be sent to your provider as well.   To learn more about what you can do with MyChart, go to  [email or web address].    Your next appointment:   4 week(s)  The format for your next appointment:   In Person  Provider:   Cristopher Peru, MD   Other Instructions I sent device clinic a message to check you device.

## 2021-02-24 NOTE — Progress Notes (Signed)
Remote pacemaker transmission.   

## 2021-02-24 NOTE — Progress Notes (Addendum)
Cardiology Office Note:    Date:  02/24/2021   ID:  Natalie Castro, DOB [birth year], MRN [id number]  PCP:  [name], MD   [employer]  Cardiologist:  Cristopher Peru, MD   Advanced Practice Provider:  None  Electrophysiologist:  None       Referring MD: [name]*   Chief Complaint:  Hospitalization Follow-up (ED visit with chest pain )    Patient Profile:    Natalie Castro is a 80 y.o. female with:   Complete heart block s/p pacemaker  Hypertension   Diabetes mellitus   Hyperlipidemia   Hx of saddle pulmonary embolism   L leg DVT  Apixaban  Prior CV studies: Echocardiogram 10/26/18 Mod LVH, EF 50-55, Gr 1 DD, trivial MR, trivial TR, trivial eff   History of Present Illness:    Natalie Castro was last seen by Dr. Lovena Le in 12/21.  She presented to the ED yesterday with chest pain.  Her hsTrop was normal without significant ?Marland Kitchen  Her DDimer was not significantly elevated ad her CXR was unremarkable.  Her ECG showed LBBB.         Data: K 4.2, SCr 0.72, ALT 11, Hgb 13 hsTrop 12>>12 DDimer 0.57 CXR: NAD EKG: probable NSR, LBBB  She returns for f/u after her recent trip to the ED.  She is here with her daughter.  Yesterday, she had an uncomfortable feeling in her L chest and central chest.  She had a hard time getting comfortable.  She did not have associated shortness of breath.  She has not had orthopnea, leg edema, syncope.  She has not had exertional symptoms, pleuritic symptoms or positional chest pain.  She has not had a recurrence today.  She does feel weak/tired.  She had 2-3 hours of symptoms prior to coming to the ED.    Past Medical History:  Diagnosis Date  . Acid reflux   . Arthritis   . Colon adenomas    AGE 84  . Complete heart block (HCC)    STJ PPM Dr. [name] 11/24/15  . Essential hypertension   . 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 Hyperlipidemia   .  Presence of permanent cardiac pacemaker   . Type 2 diabetes mellitus (HCC)     Current Medications: Current Meds  Medication Sig  . atorvastatin (LIPITOR) 40 MG tablet TAKE (1) TABLET BY MOUTH AT BEDTIME. (Patient taking differently: Take 40 mg by mouth daily.)  . ELIQUIS 5 MG TABS tablet TAKE 1 TABLET BY MOUTH TWICE A DAY. (Patient taking differently: Take 5 mg by mouth 2 (two) times daily.)  . glucose blood test strip Use to check blood glucose fasting , before lunch and dinner and after your largest meal  . hydrALAZINE (APRESOLINE) 50 MG tablet Take 50 mg by mouth 2 (two) times daily.  . Lancets (ONETOUCH ULTRASOFT) lancets Use to check glucose 4 x daily  . LANTUS SOLOSTAR 100 UNIT/ML Solostar Pen Inject 30 Units into the skin at bedtime.  Marland Kitchen losartan (COZAAR) 100 MG tablet TAKE (1) TABLET BY MOUTH ONCE DAILY. (Patient taking differently: Take 100 mg by mouth daily.)  . magnesium gluconate (MAGONATE) 500 MG tablet Take 1 tablet (500 mg total) by mouth daily.  . Magnesium Oxide 500 MG TABS TAKE 1 TABLET BY MOUTH ONCE A DAY. (Patient taking differently: Take 1 tablet by mouth daily.)  . metFORMIN (GLUCOPHAGE) 1000 MG tablet TAKE (  1) TABLET BY MOUTH TWICE DAILY. (Patient taking differently: Take 1,000 mg by mouth in the morning and at bedtime.)  . metoprolol succinate (TOPROL-XL) 25 MG 24 hr tablet Take 25 mg by mouth daily.  Marland Kitchen NIFEdipine (PROCARDIA-XL/NIFEDICAL-XL) 30 MG 24 hr tablet TAKE 1 TABLET BY MOUTH ONCE A DAY. (Patient taking differently: Take 30 mg by mouth daily.)  . nitroGLYCERIN (NITROSTAT) 0.4 MG SL tablet Place 1 tablet (0.4 mg total) under the tongue every 5 (five) minutes as needed for chest pain.     Allergies:   Penicillins   Social History   Tobacco Use  . Smoking status: Never Smoker  . Smokeless tobacco: Never Used  Vaping Use  . Vaping Use: Never used  Substance Use Topics  . Alcohol use: No    Alcohol/week: 0.0 standard drinks  . Drug use: No     Family  Hx: The patient's family history includes Cancer in her sister; Dementia in her sister; Heart failure in her mother and sister; Hypertension in her father and mother. There is no history of Colon cancer or Colon polyps.  ROS   EKGs/Labs/Other Test Reviewed:    EKG:  EKG is   ordered today.  The ekg ordered today demonstrates normal sinus rhythm, HR 91, RAD, IVCD, QTc 509, similar to old tracings   Recent Labs: 06/30/2020: TSH 0.88 02/23/2021: ALT 11; BUN 19; Creatinine, Ser 0.72; Hemoglobin 13.0; Platelets 201; Potassium 4.2; Sodium 139   Recent Lipid Panel Lab Results  Component Value Date/Time   CHOL 120 11/06/2019 08:10 AM   TRIG 66 11/06/2019 08:10 AM   HDL 43 (L) 11/06/2019 08:10 AM   CHOLHDL 2.8 11/06/2019 08:10 AM   LDLCALC 62 11/06/2019 08:10 AM      Risk Assessment/Calculations:      Physical Exam:    VS:  BP 134/82   Pulse 100   Ht 5\' 2"  (1.575 m)   Wt 214 lb 3.2 oz (97.2 kg)   SpO2 98%   BMI 39.18 kg/m     Wt Readings from Last 3 Encounters:  02/24/21 214 lb 3.2 oz (97.2 kg)  02/23/21 212 lb (96.2 kg)  12/23/20 216 lb (98 kg)     Constitutional:      Appearance: Healthy appearance. Not in distress.  Neck:     Vascular: JVD normal.  Pulmonary:     Effort: Pulmonary effort is normal.     Breath sounds: No wheezing. No rales.  Cardiovascular:     Normal rate. Regular rhythm. Normal S1. Normal S2.     Murmurs: There is no murmur.  Edema:    Peripheral edema absent.  Abdominal:     Palpations: Abdomen is soft.  Skin:    General: Skin is warm and dry.  Neurological:     Mental Status: Alert and oriented to person, place and time.     Cranial Nerves: Cranial nerves are intact.       ASSESSMENT & PLAN:    1. Precordial pain She had vague chest symptoms for about 2-3 hours yesterday.  Her hsTrop levels were normal.  She had a normal CXR and no significant elevation in her DDimer.  She has been on anticoagulation with Apixaban without interruption.   It is very reassuring that her hsTrop levels were normal.  She is diabetic and is at risk for obstructive CAD.  I have recommended proceeding with a Lexiscan Myoview.  I will give her a prescription for as needed nitroglycerin.  F/u with  Dr. Lovena Le in 4-6 weeks.   2. Essential hypertension Fair control.  Continue current medications.   3. Pacemaker The description of her symptoms is suspicious for an arrhythmia as well.  I will ask the device clinic to do a remote check to see if she had any significantly abnormal arrhythmia.  F/u with EP as planned.   4. History of pulmonary embolism Hx of acute saddle embolus, unprovoked. She remains on long term anticoagulation.     Shared Decision Making/Informed Consent The risks [chest pain, shortness of breath, cardiac arrhythmias, dizziness, blood pressure fluctuations, myocardial infarction, stroke/transient ischemic attack, nausea, vomiting, allergic reaction, radiation exposure, metallic taste sensation and life-threatening complications (estimated to be 1 in 10,000)], benefits (risk stratification, diagnosing coronary artery disease, treatment guidance) and alternatives of a nuclear stress test were discussed in detail with Ms. Shimmin and she agrees to proceed.    Dispo:  Return in about 4 weeks (around 03/24/2021) for Dr. Lovena Le.     Medication Adjustments/Labs and Tests Ordered: Current medicines are reviewed at length with the patient today.  Concerns regarding medicines are outlined above.  Tests Ordered: Orders Placed This Encounter  Procedures  . NM Myocar Multi W/Spect W/Wall Motion / EF  . EKG 12-Lead   Medication Changes: Meds ordered this encounter  Medications  . nitroGLYCERIN (NITROSTAT) 0.4 MG SL tablet    Sig: Place 1 tablet (0.4 mg total) under the tongue every 5 (five) minutes as needed for chest pain.    Dispense:  25 tablet    Refill:  3    Signed, Richardson Dopp, PA-C  02/24/2021 4:47 PM    [facility]  [facility] [address], [place], [place]  [address] Phone: [phone]; Fax: [phone]

## 2021-02-25 ENCOUNTER — Telehealth: Payer: Self-pay

## 2021-02-25 NOTE — Telephone Encounter (Signed)
Manual transmission received. AS/VS 96 bpm. There were 2 AT events logged 01/19/21, appear (1:1), longest in duration 3.5 hours. This is not a few finding for patient. No events logged during time of symptoms. Advised patient I will froward to Richardson Dopp, PA.

## 2021-02-25 NOTE — Telephone Encounter (Signed)
-----   Message from Tamsen Snider sent at 02/24/2021  4:33 PM EST ----- Can you please interrogate this pt's device per Richardson Dopp, PA was in hospital yesterday and is being seen by [facility] today.  Will you please send Nicki Reaper the update.   Thanks danielle

## 2021-02-25 NOTE — Telephone Encounter (Signed)
Device interrogation without significant findings to explain recent symptoms. Continue current plan. Richardson Dopp, PA-C    02/25/2021 9:21 AM

## 2021-03-02 ENCOUNTER — Encounter: Payer: Self-pay | Admitting: Podiatry

## 2021-03-02 ENCOUNTER — Ambulatory Visit (INDEPENDENT_AMBULATORY_CARE_PROVIDER_SITE_OTHER): Payer: Medicare Other | Admitting: Podiatry

## 2021-03-02 ENCOUNTER — Other Ambulatory Visit: Payer: Self-pay

## 2021-03-02 DIAGNOSIS — M79674 Pain in right toe(s): Secondary | ICD-10-CM

## 2021-03-02 DIAGNOSIS — M79675 Pain in left toe(s): Secondary | ICD-10-CM | POA: Diagnosis not present

## 2021-03-02 DIAGNOSIS — M2041 Other hammer toe(s) (acquired), right foot: Secondary | ICD-10-CM

## 2021-03-02 DIAGNOSIS — M2011 Hallux valgus (acquired), right foot: Secondary | ICD-10-CM

## 2021-03-02 DIAGNOSIS — E1165 Type 2 diabetes mellitus with hyperglycemia: Secondary | ICD-10-CM

## 2021-03-02 DIAGNOSIS — B351 Tinea unguium: Secondary | ICD-10-CM

## 2021-03-02 DIAGNOSIS — M2012 Hallux valgus (acquired), left foot: Secondary | ICD-10-CM

## 2021-03-02 DIAGNOSIS — M2042 Other hammer toe(s) (acquired), left foot: Secondary | ICD-10-CM

## 2021-03-02 DIAGNOSIS — L84 Corns and callosities: Secondary | ICD-10-CM

## 2021-03-07 NOTE — Progress Notes (Addendum)
Subjective: Natalie Castro presents today for preventative diabetic foot care and painful thick toenails that are difficult to trim. Pain interferes with ambulation. Aggravating factors include wearing enclosed shoe gear. Pain is relieved with periodic professional debridement..  Patient states blood sugar was 106 mg/d yesterday morning.   [name], MD is patient's PCP. Last visit was 02/19/2021. Patient's endocrinologist is Dr. Bud Face and last visit was 11/09/2020.   Allergies  Allergen Reactions  . Penicillins Hives, Itching and Other (See Comments)    Has patient had a PCN reaction causing immediate rash, facial/tongue/throat swelling, SOB or lightheadedness with hypotension: No Has patient had a PCN reaction causing severe rash involving mucus membranes or skin necrosis: Yes Has patient had a PCN reaction that required hospitalization No Has patient had a PCN reaction occurring within the last 10 years: No If all of the above answers are "NO", then may proceed with Cephalosporin use.    Review of Systems: Negative except as noted in the HPI.  Objective: There were no vitals filed for this visit.  Natalie Castro is a pleasant 80 y.o. female, morbidly obese in NAD. AAO X 3.  Vascular Examination: Capillary refill time to digits immediate b/l. Faintly palpable pedal pulses b/l. Pedal hair absent. Lower extremity skin temperature gradient within normal limits. No pain with calf compression b/l. Nonpitting edema noted b/l lower extremities.  Dermatological Examination: Pedal skin with normal turgor, texture and tone bilaterally. No open wounds bilaterally. No interdigital macerations bilaterally. Toenails 1-5 b/l elongated, discolored, dystrophic, thickened, crumbly with subungual debris and tenderness to dorsal palpation. Hyperkeratotic lesion(s) R hallux.  No erythema, no edema, no drainage, no fluctuance.  Musculoskeletal Examination: Normal muscle strength 5/5 to all  lower extremity muscle groups bilaterally. No pain crepitus or joint limitation noted with ROM b/l. 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 Hammertoes noted to the L 5th toe and R 5th toe. Utilizes rollator for ambulation assistance.  Neurological Examination: Pt has subjective symptoms of neuropathy. Protective sensation intact 5/5 intact bilaterally with 10g monofilament b/l. Vibratory sensation intact b/l.   1. Pain due to onychomycosis of toenails of both feet   2. Callus   3. Hallux valgus, acquired, bilateral   4. Acquired hammertoes of both feet   5. Uncontrolled type 2 diabetes mellitus with hyperglycemia ([place])    Plan: -Examined patient. -Continue diabetic foot care principles. -Patient to continue soft, supportive shoe gear daily. -Toenails 1-5 b/l were debrided in length and girth with sterile nail nippers and dremel without iatrogenic bleeding.  -Callus(es) R hallux pared utilizing sterile scalpel blade without complication or incident. Total number debrided =1. -Patient to report any pedal injuries to medical professional immediately. -Patient/POA to call should there be question/concern in the interim.  Return in about 3 months (around 06/02/2021).  Marzetta Board, DPM

## 2021-03-16 ENCOUNTER — Encounter (HOSPITAL_COMMUNITY): Payer: Medicare Other

## 2021-03-30 ENCOUNTER — Encounter (HOSPITAL_COMMUNITY)
Admission: RE | Admit: 2021-03-30 | Discharge: 2021-03-30 | Disposition: A | Discharge status: Home / Self Care | Payer: Medicare Other | Source: Ambulatory Visit | Attending: Physician Assistant | Admitting: Physician Assistant

## 2021-03-30 ENCOUNTER — Encounter (HOSPITAL_COMMUNITY): Payer: Self-pay

## 2021-03-30 ENCOUNTER — Encounter (HOSPITAL_BASED_OUTPATIENT_CLINIC_OR_DEPARTMENT_OTHER)
Admission: RE | Admit: 2021-03-30 | Discharge: 2021-03-30 | Disposition: A | Discharge status: Home / Self Care | Payer: Medicare Other | Source: Ambulatory Visit | Attending: Physician Assistant | Admitting: Physician Assistant

## 2021-03-30 DIAGNOSIS — R072 Precordial pain: Secondary | ICD-10-CM | POA: Diagnosis present

## 2021-03-30 LAB — NM MYOCAR MULTI W/SPECT W/WALL MOTION / EF
LV dias vol: 96 mL (ref 46–106)
LV sys vol: 50 mL
Peak HR: 92 {beats}/min
RATE: 0.33
Rest HR: 69 {beats}/min
SDS: 3
SRS: 3
SSS: 6
TID: 0.98

## 2021-03-30 MED ORDER — REGADENOSON 0.4 MG/5ML IV SOLN
INTRAVENOUS | Status: AC
  Administered 2021-03-30: 0.4 mg via INTRAVENOUS
  Filled 2021-03-30: qty 5

## 2021-03-30 MED ORDER — TECHNETIUM TC 99M TETROFOSMIN IV KIT
30.0000 | PACK | Freq: Once | INTRAVENOUS | Status: AC | PRN
  Administered 2021-03-30: 30 via INTRAVENOUS

## 2021-03-30 MED ORDER — SODIUM CHLORIDE FLUSH 0.9 % IV SOLN
INTRAVENOUS | Status: AC
  Administered 2021-03-30: 10 mL via INTRAVENOUS
  Filled 2021-03-30: qty 10

## 2021-03-30 MED ORDER — TECHNETIUM TC 99M TETROFOSMIN IV KIT
10.0000 | PACK | Freq: Once | INTRAVENOUS | Status: AC | PRN
  Administered 2021-03-30: 10.7 via INTRAVENOUS

## 2021-03-31 ENCOUNTER — Encounter: Payer: Self-pay | Admitting: Physician Assistant

## 2021-05-04 ENCOUNTER — Other Ambulatory Visit: Payer: Self-pay

## 2021-05-04 ENCOUNTER — Encounter: Payer: Self-pay | Admitting: Internal Medicine

## 2021-05-04 ENCOUNTER — Ambulatory Visit: Payer: Medicare Other | Admitting: Internal Medicine

## 2021-05-04 VITALS — BP 130/60 | HR 87 | Ht 62.0 in

## 2021-05-04 DIAGNOSIS — I442 Atrioventricular block, complete: Secondary | ICD-10-CM | POA: Diagnosis not present

## 2021-05-04 DIAGNOSIS — I10 Essential (primary) hypertension: Secondary | ICD-10-CM | POA: Diagnosis not present

## 2021-05-04 DIAGNOSIS — Z950 Presence of cardiac pacemaker: Secondary | ICD-10-CM

## 2021-05-04 NOTE — Patient Instructions (Signed)
Medication Instructions:  Your physician recommends that you continue on your current medications as directed. Please refer to the Current Medication list given to you today.  Labwork: None ordered.  Testing/Procedures: None ordered.  Follow-Up: Your physician wants you to follow-up in: one year with Cristopher Peru, MD or one of the following Advanced Practice Providers on your designated Care Team:    Chanetta Marshall, NP  Tommye Standard, PA-C  Legrand Como "Jonni Sanger" [place], [place]  Remote monitoring is used to monitor your Pacemaker from home. This monitoring reduces the number of office visits required to check your device to one time per year. It allows [place] to keep an eye on the functioning of your device to ensure it is working properly. You are scheduled for a device check from home on 05/19/2021. You may send your transmission at any time that day. If you have a wireless device, the transmission will be sent automatically. After your physician reviews your transmission, you will receive a postcard with your next transmission date.  Any Other Special Instructions Will Be Listed Below (If Applicable).  If you need a refill on your cardiac medications before your next appointment, please call your pharmacy.

## 2021-05-04 NOTE — Progress Notes (Signed)
HPI Natalie Castro presents today for ongoing evaluation and management of her PPM. She is a pleasant 80yo woman with a h/o HTN and DM who developed syncope and sob and was found to have CHB, and underwent PPM insertion.  She was hospitalizedremotelywith pulmonary emboli.No chest pain. No bleeding. She admits to dietary indiscretion. She has occaisional palpitations. Allergies  Allergen Reactions  . Penicillins Hives, Itching and Other (See Comments)    Has patient had a PCN reaction causing immediate rash, facial/tongue/throat swelling, SOB or lightheadedness with hypotension: No Has patient had a PCN reaction causing severe rash involving mucus membranes or skin necrosis: Yes Has patient had a PCN reaction that required hospitalization No Has patient had a PCN reaction occurring within the last 10 years: No If all of the above answers are "NO", then may proceed with Cephalosporin use.      Current Outpatient Medications  Medication Sig Dispense Refill  . atorvastatin (LIPITOR) 40 MG tablet TAKE (1) TABLET BY MOUTH AT BEDTIME. (Patient taking differently: Take 40 mg by mouth daily.) 30 tablet 0  . ELIQUIS 5 MG TABS tablet TAKE 1 TABLET BY MOUTH TWICE A DAY. (Patient taking differently: Take 5 mg by mouth 2 (two) times daily.) 60 tablet 0  . glucose blood test strip Use to check blood glucose fasting , before lunch and dinner and after your largest meal 120 each 12  . hydrALAZINE (APRESOLINE) 50 MG tablet Take 50 mg by mouth 2 (two) times daily.    . Lancets (ONETOUCH ULTRASOFT) lancets Use to check glucose 4 x daily 120 each 12  . LANTUS SOLOSTAR 100 UNIT/ML Solostar Pen Inject 30 Units into the skin at bedtime. 10 pen 2  . losartan (COZAAR) 100 MG tablet TAKE (1) TABLET BY MOUTH ONCE DAILY. (Patient taking differently: Take 100 mg by mouth daily.) 30 tablet 0  . magnesium gluconate (MAGONATE) 500 MG tablet Take 1 tablet (500 mg total) by mouth daily. 90 tablet 3  . Magnesium Oxide  500 MG TABS TAKE 1 TABLET BY MOUTH ONCE A DAY. (Patient taking differently: Take 1 tablet by mouth daily.) 30 tablet 11  . metFORMIN (GLUCOPHAGE) 1000 MG tablet TAKE (1) TABLET BY MOUTH TWICE DAILY. (Patient taking differently: Take 1,000 mg by mouth in the morning and at bedtime.) 60 tablet 0  . metoprolol succinate (TOPROL-XL) 25 MG 24 hr tablet Take 25 mg by mouth daily.    Marland Kitchen NIFEdipine (PROCARDIA-XL/NIFEDICAL-XL) 30 MG 24 hr tablet TAKE 1 TABLET BY MOUTH ONCE A DAY. (Patient taking differently: Take 30 mg by mouth daily.) 30 tablet 0  . nitroGLYCERIN (NITROSTAT) 0.4 MG SL tablet Place 1 tablet (0.4 mg total) under the tongue every 5 (five) minutes as needed for chest pain. 25 tablet 3   No current facility-administered medications for this visit.     Past Medical History:  Diagnosis Date  . Acid reflux   . Arthritis   . Colon adenomas    AGE 56  . Complete heart block (HCC)    STJ PPM Dr. [name] 11/24/15  . Essential hypertension   . 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 Hyperlipidemia   . Myoview 03/2021    Myoview 4/22: EF 50, no infarct or ischemia; low risk  . Presence of permanent cardiac pacemaker   . Type 2 diabetes mellitus (HCC)     ROS:   All systems reviewed and negative except as noted  in the HPI.   Past Surgical History:  Procedure Laterality Date  . COLONOSCOPY     3 SIMPLE ADENOMAS, AGE 51  . COLONOSCOPY N/A 05/24/2018   Procedure: COLONOSCOPY;  Surgeon: Danie Binder, MD;  [facility];  Service: Endoscopy;  Laterality: N/A;  1:00pm  . EP IMPLANTABLE DEVICE N/A 11/24/2015   Procedure: Pacemaker Implant;  Surgeon: [name], MD;  Location: [facility];  Service: Cardiovascular;  Laterality: N/A;  . JOINT REPLACEMENT     knees bilat.  Marland Kitchen POLYPECTOMY  05/24/2018   Procedure: POLYPECTOMY;  Surgeon: Danie Binder, MD;  [facility];  Service: Endoscopy;;  ascending and hepatic  flexure, transverse  . TUBAL LIGATION       Family History  Problem Relation Age of Onset  . Heart failure Mother   . Hypertension Mother   . Heart failure Sister   . Hypertension Father   . Cancer Sister   . Dementia Sister   . Colon cancer Neg Hx   . Colon polyps Neg Hx      Social History   Socioeconomic History  . Marital status: Widowed    Spouse name: Not on file  . Number of children: Not on file  . Years of education: Not on file  . Highest education level: Not on file  Occupational History  . Occupation: retired  Tobacco Use  . Smoking status: Never Smoker  . Smokeless tobacco: Never Used  Vaping Use  . Vaping Use: Never used  Substance and Sexual Activity  . Alcohol use: No    Alcohol/week: 0.0 standard drinks  . Drug use: No  . Sexual activity: Yes    Birth control/protection: Post-menopausal  Other Topics Concern  . Not on file  Social History Narrative   MARRIED FOR 1 YRS. 5 KIDS: #4 PRESENT TODAY(AGE 57)   Social Determinants of Health   Financial Resource Strain: Not on file  Food Insecurity: Not on file  Transportation Needs: Not on file  Physical Activity: Not on file  Stress: Not on file  Social Connections: Not on file  Intimate Partner Violence: Not on file     BP 130/60   Pulse 87   Ht 5\' 2"  (1.575 m)   SpO2 95%   BMI 39.18 kg/m   Physical Exam:  Well appearing 80 yo woman NAD HEENT: Unremarkable Neck:  No JVD, no thyromegally Lymphatics:  No adenopathy Back:  No CVA tenderness Lungs:  Clear with no wheezes HEART:  Regular rate rhythm, no murmurs, no rubs, no clicks Abd:  soft, positive bowel sounds, no organomegally, no rebound, no guarding Ext:  2 plus pulses, no edema, no cyanosis, no clubbing Skin:  No rashes no nodules Neuro:  CN II through XII intact, motor grossly intact  EKG -   DEVICE  Normal device function.  See PaceArt for details.   Assess/Plan: 1. Palpitations - her symptoms are short lived. No  change in her meds. She has atrial tachy. 2. PPM - her [employer] DDD PM is working normally. We will follow. 3. HTN - her SBP is controlled. We will follow. 4. Dyslipidemia - she will continue atorvastatin. She is encouraged to lose weight.  Natalie Overlie Berneta Sconyers,MD

## 2021-05-10 ENCOUNTER — Encounter: Payer: Self-pay | Admitting: Nutrition

## 2021-05-10 ENCOUNTER — Other Ambulatory Visit: Payer: Self-pay

## 2021-05-10 ENCOUNTER — Encounter: Payer: Self-pay | Admitting: "Endocrinology

## 2021-05-10 ENCOUNTER — Encounter: Payer: Medicare Other | Attending: "Endocrinology | Admitting: Nutrition

## 2021-05-10 ENCOUNTER — Ambulatory Visit (INDEPENDENT_AMBULATORY_CARE_PROVIDER_SITE_OTHER): Payer: Medicare Other | Admitting: "Endocrinology

## 2021-05-10 VITALS — Ht 62.0 in | Wt 212.0 lb

## 2021-05-10 VITALS — BP 148/80 | HR 93 | Wt 212.0 lb

## 2021-05-10 DIAGNOSIS — E1165 Type 2 diabetes mellitus with hyperglycemia: Secondary | ICD-10-CM | POA: Diagnosis present

## 2021-05-10 DIAGNOSIS — E782 Mixed hyperlipidemia: Secondary | ICD-10-CM

## 2021-05-10 DIAGNOSIS — Z6841 Body Mass Index (BMI) 40.0 and over, adult: Secondary | ICD-10-CM | POA: Diagnosis present

## 2021-05-10 DIAGNOSIS — I10 Essential (primary) hypertension: Secondary | ICD-10-CM | POA: Insufficient documentation

## 2021-05-10 NOTE — Patient Instructions (Addendum)
Goals   Eat three meals per day. Don't skip meals Measure foods out Cut out frying foods and bake and broil foods. Don't inject in 'knots' under the skin. Count to 5 as you push it in and count to 5-10 before you pull it out. Bring meter and log sheets to appointments

## 2021-05-10 NOTE — Patient Instructions (Signed)

## 2021-05-10 NOTE — Progress Notes (Signed)
05/10/2021, 7:47 PM  Endocrinology follow-up note   Subjective:    Patient ID: Natalie Castro, female    DOB: [birth year].  Natalie Castro is being seen in follow-up after she was seen in consultation for management of currently uncontrolled symptomatic diabetes requested by  [name], MD.   Past Medical History:  Diagnosis Date  . Acid reflux   . Arthritis   . Colon adenomas    AGE 80  . Complete heart block (HCC)    STJ PPM Dr. [name] 11/24/15  . Essential hypertension   . 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 Hyperlipidemia   . Myoview 03/2021    Myoview 4/22: EF 50, no infarct or ischemia; low risk  . Presence of permanent cardiac pacemaker   . Type 2 diabetes mellitus ([place])     Past Surgical History:  Procedure Laterality Date  . COLONOSCOPY     3 SIMPLE ADENOMAS, AGE 76  . COLONOSCOPY N/A 05/24/2018   Procedure: COLONOSCOPY;  Surgeon: Danie Binder, MD;  [facility];  Service: Endoscopy;  Laterality: N/A;  1:00pm  . EP IMPLANTABLE DEVICE N/A 11/24/2015   Procedure: Pacemaker Implant;  Surgeon: [name], MD;  Location: [facility];  Service: Cardiovascular;  Laterality: N/A;  . JOINT REPLACEMENT     knees bilat.  Marland Kitchen POLYPECTOMY  05/24/2018   Procedure: POLYPECTOMY;  Surgeon: Danie Binder, MD;  [facility];  Service: Endoscopy;;  ascending and hepatic flexure, transverse  . TUBAL LIGATION      Social History   Socioeconomic History  . Marital status: Widowed    Spouse name: Not on file  . Number of children: Not on file  . Years of education: Not on file  . Highest education level: Not on file  Occupational History  . Occupation: retired  Tobacco Use  . Smoking status: Never Smoker  . Smokeless tobacco: Never Used  Vaping Use  . Vaping Use: Never used  Substance and Sexual Activity  .  Alcohol use: No    Alcohol/week: 0.0 standard drinks  . Drug use: No  . Sexual activity: Yes    Birth control/protection: Post-menopausal  Other Topics Concern  . Not on file  Social History Narrative   MARRIED FOR 33 YRS. 5 KIDS: #4 PRESENT TODAY(AGE 74)   Social Determinants of Health   Financial Resource Strain: Not on file  Food Insecurity: Not on file  Transportation Needs: Not on file  Physical Activity: Not on file  Stress: Not on file  Social Connections: Not on file    Family History  Problem Relation Age of Onset  . Heart failure Mother   . Hypertension Mother   . Heart failure Sister   . Hypertension Father   . Cancer Sister   . Dementia Sister   . Colon cancer Neg Hx   . Colon polyps Neg Hx     Outpatient Encounter Medications as of 05/10/2021  Medication Sig  . atorvastatin (LIPITOR) 40 MG tablet TAKE (1) TABLET BY MOUTH AT BEDTIME. (Patient taking differently: Take 40 mg by mouth daily.)  .  ELIQUIS 5 MG TABS tablet TAKE 1 TABLET BY MOUTH TWICE A DAY. (Patient taking differently: Take 5 mg by mouth 2 (two) times daily.)  . glucose blood test strip Use to check blood glucose fasting , before lunch and dinner and after your largest meal  . hydrALAZINE (APRESOLINE) 50 MG tablet Take 50 mg by mouth 2 (two) times daily.  . Lancets (ONETOUCH ULTRASOFT) lancets Use to check glucose 4 x daily  . LANTUS SOLOSTAR 100 UNIT/ML Solostar Pen Inject 30 Units into the skin at bedtime.  Marland Kitchen losartan (COZAAR) 100 MG tablet TAKE (1) TABLET BY MOUTH ONCE DAILY. (Patient taking differently: Take 100 mg by mouth daily.)  . magnesium gluconate (MAGONATE) 500 MG tablet Take 1 tablet (500 mg total) by mouth daily.  . Magnesium Oxide 500 MG TABS TAKE 1 TABLET BY MOUTH ONCE A DAY. (Patient taking differently: Take 1 tablet by mouth daily.)  . metFORMIN (GLUCOPHAGE) 1000 MG tablet TAKE (1) TABLET BY MOUTH TWICE DAILY. (Patient taking differently: Take 1,000 mg by mouth in the morning and at  bedtime.)  . metoprolol succinate (TOPROL-XL) 25 MG 24 hr tablet Take 25 mg by mouth daily.  Marland Kitchen NIFEdipine (PROCARDIA-XL/NIFEDICAL-XL) 30 MG 24 hr tablet TAKE 1 TABLET BY MOUTH ONCE A DAY. (Patient taking differently: Take 30 mg by mouth daily.)  . nitroGLYCERIN (NITROSTAT) 0.4 MG SL tablet Place 1 tablet (0.4 mg total) under the tongue every 5 (five) minutes as needed for chest pain.   No facility-administered encounter medications on file as of 05/10/2021.    ALLERGIES: Allergies  Allergen Reactions  . Penicillins Hives, Itching and Other (See Comments)    Has patient had a PCN reaction causing immediate rash, facial/tongue/throat swelling, SOB or lightheadedness with hypotension: No Has patient had a PCN reaction causing severe rash involving mucus membranes or skin necrosis: Yes Has patient had a PCN reaction that required hospitalization No Has patient had a PCN reaction occurring within the last 10 years: No If all of the above answers are "NO", then may proceed with Cephalosporin use.     VACCINATION STATUS: Immunization History  Administered Date(s) Administered  . Fluad Quad(high Dose 65+) 11/04/2019  . Influenza, Seasonal, Injecte, Preservative Fre 09/25/2017  . Influenza-Unspecified 10/26/2018  . Moderna Sars-Covid-2 Vaccination 02/25/2020, 03/24/2020  . Pneumococcal Polysaccharide-23 02/24/2017    Diabetes She presents for her follow-up diabetic visit. She has type 2 diabetes mellitus. Onset time: She was diagnosed at approximate age of 65 years. Her disease course has been worsening. There are no hypoglycemic associated symptoms. Pertinent negatives for hypoglycemia include no confusion, headaches, pallor or seizures. There are no diabetic associated symptoms. Pertinent negatives for diabetes include no chest pain, no polydipsia, no polyphagia and no polyuria. There are no hypoglycemic complications. Symptoms are worsening. Diabetic complications include heart disease.  Pertinent negatives for diabetic complications include no nephropathy. Risk factors for coronary artery disease include tobacco exposure, obesity, hypertension, dyslipidemia, diabetes mellitus, post-menopausal and sedentary lifestyle. Current diabetic treatment includes insulin injections. She is compliant with treatment most of the time. Her weight is fluctuating minimally. She is following a generally unhealthy diet. When asked about meal planning, she reported none. She has had a previous visit with a dietitian. Her overall blood glucose range is 180-200 mg/dl. (She did not bring any meter nor logs to review. She reports that her BG profile generally is between 100-200. Her last  point-of-care A1c 7.5%, overall improving from 10%.  She does not report hypoglycemia.     )  An ACE inhibitor/angiotensin II receptor blocker is being taken.  Hyperlipidemia This is a chronic problem. Exacerbating diseases include diabetes. Pertinent negatives include no chest pain, myalgias or shortness of breath. Current antihyperlipidemic treatment includes statins. Risk factors for coronary artery disease include diabetes mellitus, dyslipidemia, family history, hypertension, post-menopausal and a sedentary lifestyle.  Hypertension This is a chronic problem. The current episode started more than 1 year ago. Pertinent negatives include no chest pain, headaches, palpitations or shortness of breath. Risk factors for coronary artery disease include diabetes mellitus, dyslipidemia, sedentary lifestyle and post-menopausal state.     Review of Systems  Constitutional: Negative for chills, fever and unexpected weight change.  HENT: Negative for trouble swallowing and voice change.   Eyes: Negative for visual disturbance.  Respiratory: Negative for cough, shortness of breath and wheezing.   Cardiovascular: Negative for chest pain, palpitations and leg swelling.  Gastrointestinal: Negative for diarrhea, nausea and vomiting.   Endocrine: Negative for cold intolerance, heat intolerance, polydipsia, polyphagia and polyuria.  Musculoskeletal: Negative for arthralgias and myalgias.  Skin: Negative for color change, pallor, rash and wound.  Neurological: Negative for seizures and headaches.  Psychiatric/Behavioral: Negative for confusion and suicidal ideas.    Objective:    Vitals with BMI 05/10/2021 05/10/2021 05/04/2021  Height 5\' 2"  - 5\' 2"   Weight 212 lbs 212 lbs -  BMI 00.93 81.82 -  Systolic - 993 716  Diastolic - 80 60  Pulse - 93 87    BP (!) 148/80   Pulse 93   Wt 212 lb (96.2 kg)   SpO2 99%   BMI 38.78 kg/m   Wt Readings from Last 3 Encounters:  05/10/21 212 lb (96.2 kg)  05/10/21 212 lb (96.2 kg)  02/24/21 214 lb 3.2 oz (97.2 kg)     Physical Exam Constitutional:      Appearance: She is well-developed.  HENT:     Head: Normocephalic and atraumatic.  Neck:     Thyroid: No thyromegaly.     Trachea: No tracheal deviation.  Cardiovascular:     Rate and Rhythm: Normal rate and regular rhythm.  Pulmonary:     Effort: Pulmonary effort is normal.  Abdominal:     Tenderness: There is no abdominal tenderness. There is no guarding.  Musculoskeletal:        General: Normal range of motion.     Cervical back: Normal range of motion and neck supple.     Comments: Walks with a cane.  Skin:    General: Skin is warm and dry.     Coloration: Skin is not pale.     Findings: No erythema or rash.  Neurological:     Mental Status: She is alert and oriented to person, place, and time.     Cranial Nerves: No cranial nerve deficit.     Coordination: Coordination normal.     Deep Tendon Reflexes: Reflexes are normal and symmetric.  Psychiatric:        Judgment: Judgment normal.       CMP ( most recent) CMP     Component Value Date/Time   NA 139 02/23/2021 1146   K 4.2 02/23/2021 1146   CL 107 02/23/2021 1146   CO2 23 02/23/2021 1146   GLUCOSE 139 (H) 02/23/2021 1146   BUN 19 02/23/2021  1146   CREATININE 0.72 02/23/2021 1146   CREATININE 0.73 06/30/2020 1527   CALCIUM 9.5 02/23/2021 1146   PROT 7.8 02/23/2021 1146   ALBUMIN 3.6 02/23/2021 1146  AST 14 (L) 02/23/2021 1146   ALT 11 02/23/2021 1146   ALKPHOS 63 02/23/2021 1146   BILITOT 0.3 02/23/2021 1146   GFRNONAA >60 02/23/2021 1146   GFRNONAA 79 06/30/2020 1527   GFRAA 91 06/30/2020 1527     Diabetic Labs (most recent): Lab Results  Component Value Date   HGBA1C 7.5 (A) 11/09/2020   HGBA1C 7.7 (A) 07/06/2020   HGBA1C 7.7 (A) 03/10/2020    Lipid Panel     Component Value Date/Time   CHOL 120 11/06/2019 0810   TRIG 66 11/06/2019 0810   HDL 43 (L) 11/06/2019 0810   CHOLHDL 2.8 11/06/2019 0810   LDLCALC 62 11/06/2019 0810      Lab Results  Component Value Date   TSH 0.88 06/30/2020   TSH 0.75 11/06/2019   TSH 0.573 11/01/2018   TSH 0.150 (L) 02/22/2017   TSH 0.713 11/23/2015   FREET4 1.2 06/30/2020      Assessment & Plan:   1. Uncontrolled type 2 diabetes mellitus with hyperglycemia (HCC)   - Natalie Castro has currently uncontrolled symptomatic type 2 DM since  80 years of age.  She did not bring any meter nor logs to review. She reports that her BG profile generally is between 100-200. Her last  point-of-care A1c 7.5%, overall improving from 10%.  She does not report hypoglycemia.  - Recent labs reviewed. - I had a long discussion with her about the progressive nature of diabetes and the pathology behind its complications. -her diabetes is complicated by coronary artery disease, retinopathy, deconditioning/sedentary life, obesity and she remains at a high risk for more acute and chronic complications which include CAD, CVA, CKD, retinopathy, and neuropathy. These are all discussed in detail with her.  - I have counseled her on diet  and weight management  by adopting a carbohydrate restricted/protein rich diet. Patient is encouraged to switch to  unprocessed or minimally processed      complex starch and increased protein intake (animal or plant source), fruits, and vegetables. -  she is advised to stick to a routine mealtimes to eat 3 meals  a day and avoid unnecessary snacks ( to snack only to correct hypoglycemia).     - she acknowledges that there is a room for improvement in her food and drink choices. - Suggestion is made for her to avoid simple carbohydrates  from her diet including Cakes, Sweet Desserts, Ice Cream, Soda (diet and regular), Sweet Tea, Candies, Chips, Cookies, Store Bought Juices, Alcohol in Excess of  1-2 drinks a day, Artificial Sweeteners,  Coffee Creamer, and "Sugar-free" Products, Lemonade. This will help patient to have more stable blood glucose profile and potentially avoid unintended weight gain.   - she will be scheduled with Jearld Fenton, RDN, CDE for diabetes education.  - I have approached her with the following individualized plan to manage  her diabetes and patient agrees:   - she will continue to benefit and respond to at least basal insulin.  She is advised to continue Lantus 30 units nightly, associated with monitoring of blood glucose  4 x  a day daily before meals and at bedtime and return in 10 days with her meter and logs.   - she is encouraged to call clinic for blood glucose levels less than 70 or above 300 mg /dl. - she is advised to continue Metformin  1000 mg p.o. twice daily, therapeutically suitable for patient .  - she will be considered for incretin therapy as  appropriate next visit.  - Specific targets for  A1c;  LDL, HDL,  and Triglycerides were discussed with the patient.  2) Blood Pressure /Hypertension: Her blood pressure slightly above target.   she is advised to continue her current medications including Procardia XL/Nifedical XL  30mg  p.o. daily with breakfast .  She is also on Cozaar 100 mg p.o. daily.  3) Lipids/Hyperlipidemia:   Review of her recent lipid panel showed  controlled  LDL at 62 .  she  is advised  to continue Atorvastatin 40 mg p.o. daily at bedtime .  Side effects and precautions discussed with her.  4)  Weight/Diet:  Body mass index is 38.78 kg/m.  -   clearly complicating her diabetes care.   she is  a candidate for weight loss. I discussed with her the fact that loss of 5 - 10% of her  current body weight will have the most impact on her diabetes management.  Exercise, and detailed carbohydrates information provided  -  detailed on discharge instructions.  5) Chronic Care/Health Maintenance:  -she  is on ACEI/ARB and Statin medications and  is encouraged to initiate and continue to follow up with Ophthalmology, Dentist,  Podiatrist at least yearly or according to recommendations, and advised to   stay away from smoking. I have recommended yearly flu vaccine and pneumonia vaccine at least every 5 years; moderate intensity exercise for up to 150 minutes weekly; and  sleep for at least 7 hours a day.  - she is  advised to maintain close follow up with [name], MD for primary care needs, as well as her other providers for optimal and coordinated care.    I spent 33 minutes in the care of the patient today including review of labs from [facility], Lipids, Thyroid Function, Hematology (current and previous including abstractions from other facilities); face-to-face time discussing  her blood glucose readings/logs, discussing hypoglycemia and hyperglycemia episodes and symptoms, medications doses, her options of short and long term treatment based on the latest standards of care / guidelines;  discussion about incorporating lifestyle medicine;  and documenting the encounter.    Please refer to Patient Instructions for Blood Glucose Monitoring and Insulin/Medications Dosing Guide"  in media tab for additional information. Please  also refer to " Patient Self Inventory" in the Media  tab for reviewed elements of pertinent patient history.  Natalie Castro participated in the discussions,  expressed understanding, and voiced agreement with the above plans.  All questions were answered to her satisfaction. she is encouraged to contact clinic should she have any questions or concerns prior to her return visit.  Follow up plan: - Return in about 10 days (around 05/20/2021) for Bring Meter and Logs- A1c in Office.  Glade Lloyd, MD [facility] [address] [place], [place] [address] Phone: [phone]  Fax: [phone]    05/10/2021, 7:47 PM  This note was partially dictated with voice recognition software. Similar sounding words can be transcribed inadequately or may not  be corrected upon review.

## 2021-05-10 NOTE — Progress Notes (Signed)
  Medical Nutrition Therapy:  Appt start time: 1400 end time:  1430 Assessment:  Primary concerns today: Diabetes Type 2, Obesity, HTN.  Not able to get A1C done today.  Saw Dr. Dorris Fetch today. Lost 2 lbs. Changes made: Hasn't made many changes. Still sleeps in and only eats 2 meals per day.   She reports feeling little hard knots under her skin in some of the areas she injects her insulin. Didn't bring meter or logs today. FBS: 90-100's.  Bedtime 150-180's. Hasn't changed much on her foods. Will stay at 30 units of Lantus and 1000 mg BID of Metformin.  Lab Results  Component Value Date   HGBA1C 7.5 (A) 11/09/2020   CMP Latest Ref Rng & Units 02/23/2021 06/30/2020 11/06/2019  Glucose 70 - 99 mg/dL 139(H) 166(H) 135(H)  BUN 8 - 23 mg/dL 19 14 16   Creatinine 0.44 - 1.00 mg/dL 0.72 0.73 0.88  Sodium 135 - 145 mmol/L 139 140 142  Potassium 3.5 - 5.1 mmol/L 4.2 4.1 4.6  Chloride 98 - 111 mmol/L 107 105 103  CO2 22 - 32 mmol/L 23 27 29   Calcium 8.9 - 10.3 mg/dL 9.5 8.6 9.6  Total Protein 6.5 - 8.1 g/dL 7.8 6.7 6.8  Total Bilirubin 0.3 - 1.2 mg/dL 0.3 0.2 0.4  Alkaline Phos 38 - 126 U/L 63 - -  AST 15 - 41 U/L 14(L) 10 11  ALT 0 - 44 U/L 11 8 7    Lipid Panel     Component Value Date/Time   CHOL 120 11/06/2019 0810   TRIG 66 11/06/2019 0810   HDL 43 (L) 11/06/2019 0810   CHOLHDL 2.8 11/06/2019 0810   LDLCALC 62 11/06/2019 0810   .  Preferred Learning Style:   No preference indicated   Learning Readiness:   Ready  Change in progress   MEDICATIONS:   DIETARY INTAKE:      B) 2 boiled eggs and oatmeal , water L) D)  Meat and vegetables, water  Usual physical activity: ADL  Estimated energy needs: 1200 calories 135 g carbohydrates 90 g protein 33 g fat  Progress Towards Goal(s):  In progress.   Nutritional Diagnosis:  NB-1.1 Food and nutrition-related knowledge deficit As related to Diabetes Type 2, Obesity.  As evidenced by A1C 7.8% and BMI > 40.    Intervention:   Nutrition and Diabetes education provided on My Plate, CHO counting, meal planning, portion sizes, timing of meals, avoiding snacks between meals unless having a low blood sugar, target ranges for A1C and blood sugars, signs/symptoms and treatment of hyper/hypoglycemia, monitoring blood sugars, taking medications as prescribed, benefits of exercising 30 minutes per day and prevention of complications of DM. Marland Kitchen  Goals   Eat three meals per day. Don't skip meals Measure foods out Cut out frying foods and bake and broil foods. Don't inject in 'knots' under the skin. Count to 5 as you push it in and count to 5-10 before you pull it out. Bring meter and log sheets to appointments   Teaching Method Utilized:  Visual Auditory Hands on  Handouts given during visit include:  The Plate Method   Diabetes Instructions.  Barriers to learning/adherence to lifestyle change: none  Demonstrated degree of understanding via:  Teach Back   Monitoring/Evaluation:  Dietary intake, exercise, , and body weight in 3 month(s).

## 2021-05-17 ENCOUNTER — Telehealth: Payer: Self-pay | Admitting: "Endocrinology

## 2021-05-17 MED ORDER — METFORMIN HCL 1000 MG PO TABS: ORAL_TABLET | ORAL | 2 refills | Status: DC

## 2021-05-17 NOTE — Telephone Encounter (Signed)
Pt is calling and states that she is needing metFORMIN (GLUCOPHAGE) 1000 MG tablet Refilled  [id number] - [place], [place] - [address] AT [address] [place] Phone:  [phone]  Fax:  [phone]

## 2021-05-17 NOTE — Telephone Encounter (Signed)
Rx sent 

## 2021-05-19 ENCOUNTER — Ambulatory Visit (INDEPENDENT_AMBULATORY_CARE_PROVIDER_SITE_OTHER): Payer: Medicare Other

## 2021-05-19 DIAGNOSIS — I442 Atrioventricular block, complete: Secondary | ICD-10-CM | POA: Diagnosis not present

## 2021-05-20 ENCOUNTER — Encounter: Payer: Medicare Other | Attending: "Endocrinology | Admitting: Nutrition

## 2021-05-20 ENCOUNTER — Other Ambulatory Visit: Payer: Self-pay

## 2021-05-20 ENCOUNTER — Ambulatory Visit (INDEPENDENT_AMBULATORY_CARE_PROVIDER_SITE_OTHER): Payer: Medicare Other | Admitting: "Endocrinology

## 2021-05-20 ENCOUNTER — Encounter: Payer: Self-pay | Admitting: "Endocrinology

## 2021-05-20 VITALS — BP 140/82 | HR 84 | Ht 62.0 in | Wt 209.4 lb

## 2021-05-20 DIAGNOSIS — E1165 Type 2 diabetes mellitus with hyperglycemia: Secondary | ICD-10-CM

## 2021-05-20 DIAGNOSIS — E782 Mixed hyperlipidemia: Secondary | ICD-10-CM | POA: Diagnosis not present

## 2021-05-20 DIAGNOSIS — I10 Essential (primary) hypertension: Secondary | ICD-10-CM

## 2021-05-20 LAB — POCT GLYCOSYLATED HEMOGLOBIN (HGB A1C): HbA1c, POC (controlled diabetic range): 7.3 % — AB (ref 0.0–7.0)

## 2021-05-20 LAB — CUP PACEART REMOTE DEVICE CHECK
Battery Remaining Longevity: 128 mo
Battery Remaining Percentage: 95.5 %
Battery Voltage: 2.99 V
Brady Statistic AP VP Percent: 1 %
Brady Statistic AP VS Percent: 2.3 %
Brady Statistic AS VP Percent: 1 %
Brady Statistic AS VS Percent: 97 %
Brady Statistic RA Percent Paced: 1.8 %
Brady Statistic RV Percent Paced: 1 %
Date Time Interrogation Session: 20220525020012
Implantable Lead Implant Date: 20161129
Implantable Lead Implant Date: 20161129
Implantable Lead Location: 753859
Implantable Lead Location: 753860
Implantable Lead Model: 5076
Implantable Pulse Generator Implant Date: 20161129
Lead Channel Impedance Value: 360 Ohm
Lead Channel Impedance Value: 460 Ohm
Lead Channel Pacing Threshold Amplitude: 0.75 V
Lead Channel Pacing Threshold Amplitude: 0.875 V
Lead Channel Pacing Threshold Pulse Width: 0.4 ms
Lead Channel Pacing Threshold Pulse Width: 0.4 ms
Lead Channel Sensing Intrinsic Amplitude: 4.2 mV
Lead Channel Sensing Intrinsic Amplitude: 7.6 mV
Lead Channel Setting Pacing Amplitude: 1.875
Lead Channel Setting Pacing Amplitude: 2 V
Lead Channel Setting Pacing Pulse Width: 0.4 ms
Lead Channel Setting Sensing Sensitivity: 2 mV
Pulse Gen Model: 2240
Pulse Gen Serial Number: 7826037

## 2021-05-20 NOTE — Progress Notes (Signed)
05/20/2021, 12:06 PM  Endocrinology follow-up note   Subjective:    Patient ID: Natalie Castro, female    DOB: 11-01-1941.  [name] is being seen in follow-up after she was seen in consultation for management of currently uncontrolled symptomatic diabetes requested by  [name], MD.   Past Medical History:  Diagnosis Date  . Acid reflux   . Arthritis   . Colon adenomas    AGE 80  . Complete heart block (HCC)    STJ PPM Dr. [name] 11/24/15  . Essential hypertension   . 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 Hyperlipidemia   . Myoview 03/2021    Myoview 4/22: EF 50, no infarct or ischemia; low risk  . Presence of permanent cardiac pacemaker   . Type 2 diabetes mellitus ([place])     Past Surgical History:  Procedure Laterality Date  . COLONOSCOPY     3 SIMPLE ADENOMAS, AGE 2  . COLONOSCOPY N/A 05/24/2018   Procedure: COLONOSCOPY;  Surgeon: Danie Binder, MD;  Location: [facility];  Service: Endoscopy;  Laterality: N/A;  1:00pm  . EP IMPLANTABLE DEVICE N/A 11/24/2015   Procedure: Pacemaker Implant;  Surgeon: [name], MD;  Location: [facility];  Service: Cardiovascular;  Laterality: N/A;  . JOINT REPLACEMENT     knees bilat.  Marland Kitchen POLYPECTOMY  05/24/2018   Procedure: POLYPECTOMY;  Surgeon: Danie Binder, MD;  Location: [facility];  Service: Endoscopy;;  ascending and hepatic flexure, transverse  . TUBAL LIGATION      Social History   Socioeconomic History  . Marital status: Widowed    Spouse name: Not on file  . Number of children: Not on file  . Years of education: Not on file  . Highest education level: Not on file  Occupational History  . Occupation: retired  Tobacco Use  . Smoking status: Never Smoker  . Smokeless tobacco: Never Used  Vaping Use  . Vaping Use: Never used  Substance and Sexual Activity  .  Alcohol use: No    Alcohol/week: 0.0 standard drinks  . Drug use: No  . Sexual activity: Yes    Birth control/protection: Post-menopausal  Other Topics Concern  . Not on file  Social History Narrative   MARRIED FOR 63 YRS. 5 KIDS: #4 PRESENT TODAY(AGE 80)   Social Determinants of Health   Financial Resource Strain: Not on file  Food Insecurity: Not on file  Transportation Needs: Not on file  Physical Activity: Not on file  Stress: Not on file  Social Connections: Not on file    Family History  Problem Relation Age of Onset  . Heart failure Mother   . Hypertension Mother   . Heart failure Sister   . Hypertension Father   . Cancer Sister   . Dementia Sister   . Colon cancer Neg Hx   . Colon polyps Neg Hx     Outpatient Encounter Medications as of 05/20/2021  Medication Sig  . atorvastatin (LIPITOR) 40 MG tablet TAKE (1) TABLET BY MOUTH AT BEDTIME. (Patient taking differently: Take 40 mg by mouth daily.)  .  ELIQUIS 5 MG TABS tablet TAKE 1 TABLET BY MOUTH TWICE A DAY. (Patient taking differently: Take 5 mg by mouth 2 (two) times daily.)  . glucose blood test strip Use to check blood glucose fasting , before lunch and dinner and after your largest meal  . hydrALAZINE (APRESOLINE) 50 MG tablet Take 50 mg by mouth 2 (two) times daily.  . Lancets (ONETOUCH ULTRASOFT) lancets Use to check glucose 4 x daily  . LANTUS SOLOSTAR 100 UNIT/ML Solostar Pen Inject 30 Units into the skin at bedtime.  Marland Kitchen losartan (COZAAR) 100 MG tablet TAKE (1) TABLET BY MOUTH ONCE DAILY. (Patient taking differently: Take 100 mg by mouth daily.)  . metFORMIN (GLUCOPHAGE) 1000 MG tablet TAKE (1) TABLET BY MOUTH TWICE DAILY.  . metoprolol succinate (TOPROL-XL) 25 MG 24 hr tablet Take 25 mg by mouth daily.  Marland Kitchen NIFEdipine (PROCARDIA-XL/NIFEDICAL-XL) 30 MG 24 hr tablet TAKE 1 TABLET BY MOUTH ONCE A DAY. (Patient taking differently: Take 30 mg by mouth daily.)  . nitroGLYCERIN (NITROSTAT) 0.4 MG SL tablet Place 1  tablet (0.4 mg total) under the tongue every 5 (five) minutes as needed for chest pain.  . [DISCONTINUED] magnesium gluconate (MAGONATE) 500 MG tablet Take 1 tablet (500 mg total) by mouth daily.  . [DISCONTINUED] Magnesium Oxide 500 MG TABS TAKE 1 TABLET BY MOUTH ONCE A DAY. (Patient taking differently: Take 1 tablet by mouth daily.)   No facility-administered encounter medications on file as of 05/20/2021.    ALLERGIES: Allergies  Allergen Reactions  . Penicillins Hives, Itching and Other (See Comments)    Has patient had a PCN reaction causing immediate rash, facial/tongue/throat swelling, SOB or lightheadedness with hypotension: No Has patient had a PCN reaction causing severe rash involving mucus membranes or skin necrosis: Yes Has patient had a PCN reaction that required hospitalization No Has patient had a PCN reaction occurring within the last 10 years: No If all of the above answers are "NO", then may proceed with Cephalosporin use.     VACCINATION STATUS: Immunization History  Administered Date(s) Administered  . Fluad Quad(high Dose 65+) 11/04/2019  . Influenza, Seasonal, Injecte, Preservative Fre 09/25/2017  . Influenza-Unspecified 10/26/2018  . Moderna Sars-Covid-2 Vaccination 02/25/2020, 03/24/2020  . Pneumococcal Polysaccharide-23 02/24/2017    Diabetes She presents for her follow-up diabetic visit. She has type 2 diabetes mellitus. Onset time: She was diagnosed at approximate age of 43 years. Her disease course has been improving. There are no hypoglycemic associated symptoms. Pertinent negatives for hypoglycemia include no confusion, headaches, pallor or seizures. There are no diabetic associated symptoms. Pertinent negatives for diabetes include no chest pain, no polydipsia, no polyphagia and no polyuria. There are no hypoglycemic complications. Symptoms are improving. Diabetic complications include heart disease. Pertinent negatives for diabetic complications include  no nephropathy. Risk factors for coronary artery disease include tobacco exposure, obesity, hypertension, dyslipidemia, diabetes mellitus, post-menopausal and sedentary lifestyle. Current diabetic treatment includes insulin injections. She is compliant with treatment most of the time. Her weight is decreasing steadily. She is following a generally unhealthy diet. When asked about meal planning, she reported none. She has had a previous visit with a dietitian. Her home blood glucose trend is decreasing steadily. Her breakfast blood glucose range is generally 110-130 mg/dl. Her lunch blood glucose range is generally 130-140 mg/dl. Her dinner blood glucose range is generally 130-140 mg/dl. Her bedtime blood glucose range is generally 130-140 mg/dl. Her overall blood glucose range is 130-140 mg/dl. (She presents with controlled glycemic profile both  fasting and postprandial.  Her point-of-care A1c is 7.3%, generally improving from 10%.  She did not document or report any hypoglycemia.       ) An ACE inhibitor/angiotensin II receptor blocker is being taken.  Hyperlipidemia This is a chronic problem. Exacerbating diseases include diabetes. Pertinent negatives include no chest pain, myalgias or shortness of breath. Current antihyperlipidemic treatment includes statins. Risk factors for coronary artery disease include diabetes mellitus, dyslipidemia, family history, hypertension, post-menopausal and a sedentary lifestyle.  Hypertension This is a chronic problem. The current episode started more than 1 year ago. Pertinent negatives include no chest pain, headaches, palpitations or shortness of breath. Risk factors for coronary artery disease include diabetes mellitus, dyslipidemia, sedentary lifestyle and post-menopausal state.     Review of Systems  Constitutional: Negative for chills, fever and unexpected weight change.  HENT: Negative for trouble swallowing and voice change.   Eyes: Negative for visual  disturbance.  Respiratory: Negative for cough, shortness of breath and wheezing.   Cardiovascular: Negative for chest pain, palpitations and leg swelling.  Gastrointestinal: Negative for diarrhea, nausea and vomiting.  Endocrine: Negative for cold intolerance, heat intolerance, polydipsia, polyphagia and polyuria.  Musculoskeletal: Negative for arthralgias and myalgias.  Skin: Negative for color change, pallor, rash and wound.  Neurological: Negative for seizures and headaches.  Psychiatric/Behavioral: Negative for confusion and suicidal ideas.    Objective:    Vitals with BMI 05/20/2021 05/10/2021 05/10/2021  Height 5\' 2"  5\' 2"  -  Weight 209 lbs 6 oz 212 lbs 212 lbs  BMI 38.29 93.90 30.09  Systolic 233 - 007  Diastolic 82 - 80  Pulse 84 - 93    BP 140/82   Pulse 84   Ht 5\' 2"  (1.575 m)   Wt 209 lb 6.4 oz (95 kg)   BMI 38.30 kg/m   Wt Readings from Last 3 Encounters:  05/20/21 209 lb 6.4 oz (95 kg)  05/10/21 212 lb (96.2 kg)  05/10/21 212 lb (96.2 kg)     Physical Exam Constitutional:      Appearance: She is well-developed.  HENT:     Head: Normocephalic and atraumatic.  Neck:     Thyroid: No thyromegaly.     Trachea: No tracheal deviation.  Cardiovascular:     Rate and Rhythm: Normal rate and regular rhythm.  Pulmonary:     Effort: Pulmonary effort is normal.  Abdominal:     Tenderness: There is no abdominal tenderness. There is no guarding.  Musculoskeletal:        General: Normal range of motion.     Cervical back: Normal range of motion and neck supple.     Comments: Walks with a walker.  Skin:    General: Skin is warm and dry.     Coloration: Skin is not pale.     Findings: No erythema or rash.  Neurological:     Mental Status: She is alert and oriented to person, place, and time.     Cranial Nerves: No cranial nerve deficit.     Coordination: Coordination normal.     Deep Tendon Reflexes: Reflexes are normal and symmetric.  Psychiatric:         Judgment: Judgment normal.       CMP ( most recent) CMP     Component Value Date/Time   NA 139 02/23/2021 1146   K 4.2 02/23/2021 1146   CL 107 02/23/2021 1146   CO2 23 02/23/2021 1146   GLUCOSE 139 (H) 02/23/2021 1146  BUN 19 02/23/2021 1146   CREATININE 0.72 02/23/2021 1146   CREATININE 0.73 06/30/2020 1527   CALCIUM 9.5 02/23/2021 1146   PROT 7.8 02/23/2021 1146   ALBUMIN 3.6 02/23/2021 1146   AST 14 (L) 02/23/2021 1146   ALT 11 02/23/2021 1146   ALKPHOS 63 02/23/2021 1146   BILITOT 0.3 02/23/2021 1146   GFRNONAA >60 02/23/2021 1146   GFRNONAA 79 06/30/2020 1527   GFRAA 91 06/30/2020 1527     Diabetic Labs (most recent): Lab Results  Component Value Date   HGBA1C 7.3 (A) 05/20/2021   HGBA1C 7.5 (A) 11/09/2020   HGBA1C 7.7 (A) 07/06/2020    Lipid Panel     Component Value Date/Time   CHOL 120 11/06/2019 0810   TRIG 66 11/06/2019 0810   HDL 43 (L) 11/06/2019 0810   CHOLHDL 2.8 11/06/2019 0810   LDLCALC 62 11/06/2019 0810      Lab Results  Component Value Date   TSH 0.88 06/30/2020   TSH 0.75 11/06/2019   TSH 0.573 11/01/2018   TSH 0.150 (L) 02/22/2017   TSH 0.713 11/23/2015   FREET4 1.2 06/30/2020      Assessment & Plan:   1. Uncontrolled type 2 diabetes mellitus with hyperglycemia (HCC)   - Kamla B Concannon has currently uncontrolled symptomatic type 2 DM since  80 years of age.  She presents with controlled glycemic profile both fasting and postprandial.  Her point-of-care A1c is 7.3%, generally improving from 10%.  She did not document or report any hypoglycemia.   - Recent labs reviewed. - I had a long discussion with her about the progressive nature of diabetes and the pathology behind its complications. -her diabetes is complicated by coronary artery disease, retinopathy, deconditioning/sedentary life, obesity and she remains at a high risk for more acute and chronic complications which include CAD, CVA, CKD, retinopathy, and neuropathy.  These are all discussed in detail with her.  - I have counseled her on diet  and weight management  by adopting a carbohydrate restricted/protein rich diet. Patient is encouraged to switch to  unprocessed or minimally processed     complex starch and increased protein intake (animal or plant source), fruits, and vegetables. -  she is advised to stick to a routine mealtimes to eat 3 meals  a day and avoid unnecessary snacks ( to snack only to correct hypoglycemia).    - she acknowledges that there is a room for improvement in her food and drink choices. - Suggestion is made for her to avoid simple carbohydrates  from her diet including Cakes, Sweet Desserts, Ice Cream, Soda (diet and regular), Sweet Tea, Candies, Chips, Cookies, Store Bought Juices, Alcohol in Excess of  1-2 drinks a day, Artificial Sweeteners,  Coffee Creamer, and "Sugar-free" Products, Lemonade. This will help patient to have more stable blood glucose profile and potentially avoid unintended weight gain.   - she will be scheduled with Jearld Fenton, RDN, CDE for diabetes education.  - I have approached her with the following individualized plan to manage  her diabetes and patient agrees:   - she will continue to need at least basal insulin in order for her to maintain control of diabetes to target.  She is advised to continue Lantus 30 units nightly, associated with monitoring of blood glucose twice a day-daily before breakfast and at bedtime.   - she is encouraged to call clinic for blood glucose levels less than 70 or above 300 mg /dl. - she is advised to  continue metformin 1000 mg p.o. twice daily,  therapeutically suitable for patient .  - she will be considered for incretin therapy as appropriate next visit.  - Specific targets for  A1c;  LDL, HDL,  and Triglycerides were discussed with the patient.  2) Blood Pressure /Hypertension: Her blood pressure was controlled to target. she is advised to continue her current  medications including Procardia XL/Nifedical XL  30mg  p.o. daily with breakfast .  She is also on Cozaar 100 mg p.o. daily.  3) Lipids/Hyperlipidemia:   Review of her recent lipid panel showed  controlled  LDL at 62 .  she  is advised to continue atorvastatin 40 mg p.o. daily at bedtime.  Side effects and precautions discussed with her.  4)  Weight/Diet:  Body mass index is 38.3 kg/m.  -   clearly complicating her diabetes care.   she is  a candidate for weight loss. I discussed with her the fact that loss of 5 - 10% of her  current body weight will have the most impact on her diabetes management.  Exercise, and detailed carbohydrates information provided  -  detailed on discharge instructions.  5) Chronic Care/Health Maintenance:  -she  is on ACEI/ARB and Statin medications and  is encouraged to initiate and continue to follow up with Ophthalmology, Dentist,  Podiatrist at least yearly or according to recommendations, and advised to   stay away from smoking. I have recommended yearly flu vaccine and pneumonia vaccine at least every 5 years; moderate intensity exercise for up to 150 minutes weekly; and  sleep for at least 7 hours a day.  - she is  advised to maintain close follow up with [name], MD for primary care needs, as well as her other providers for optimal and coordinated care.   I spent 32 minutes in the care of the patient today including review of labs from [facility], Lipids, Thyroid Function, Hematology (current and previous including abstractions from other facilities); face-to-face time discussing  her blood glucose readings/logs, discussing hypoglycemia and hyperglycemia episodes and symptoms, medications doses, her options of short and long term treatment based on the latest standards of care / guidelines;  discussion about incorporating lifestyle medicine;  and documenting the encounter.    Please refer to Patient Instructions for Blood Glucose Monitoring and  Insulin/Medications Dosing Guide"  in media tab for additional information. Please  also refer to " Patient Self Inventory" in the Media  tab for reviewed elements of pertinent patient history.  Yolonda Kida participated in the discussions, expressed understanding, and voiced agreement with the above plans.  All questions were answered to her satisfaction. she is encouraged to contact clinic should she have any questions or concerns prior to her return visit.   Follow up plan: - Return in about 3 months (around 08/20/2021) for F/U with Pre-visit Labs, Meter, Logs, A1c here.Glade Lloyd, MD [facility] [address] [place], [place] [address] Phone: [phone]  Fax: [phone]    05/20/2021, 12:06 PM  This note was partially dictated with voice recognition software. Similar sounding words can be transcribed inadequately or may not  be corrected upon review.

## 2021-05-20 NOTE — Progress Notes (Signed)
  Medical Nutrition Therapy:  Appt start time: 1030 end time: 1100 Assessment:  Primary concerns today: Diabetes Type 2, Obesity, HTN.   CHanges made:Has been watching what she is eating. She is moving her injection site around and not having as many knots under skin. Saw Dr. Dorris Fetch today. Lost 3 lbs. Will stay at 30 units of Lantus and 1000 mg BID of Metformin.   Lab Results  Component Value Date   HGBA1C 7.5 (A) 11/09/2020   CMP Latest Ref Rng & Units 02/23/2021 06/30/2020 11/06/2019  Glucose 70 - 99 mg/dL 139(H) 166(H) 135(H)  BUN 8 - 23 mg/dL 19 14 16   Creatinine 0.44 - 1.00 mg/dL 0.72 0.73 0.88  Sodium 135 - 145 mmol/L 139 140 142  Potassium 3.5 - 5.1 mmol/L 4.2 4.1 4.6  Chloride 98 - 111 mmol/L 107 105 103  CO2 22 - 32 mmol/L 23 27 29   Calcium 8.9 - 10.3 mg/dL 9.5 8.6 9.6  Total Protein 6.5 - 8.1 g/dL 7.8 6.7 6.8  Total Bilirubin 0.3 - 1.2 mg/dL 0.3 0.2 0.4  Alkaline Phos 38 - 126 U/L 63 - -  AST 15 - 41 U/L 14(L) 10 11  ALT 0 - 44 U/L 11 8 7    Lipid Panel     Component Value Date/Time   CHOL 120 11/06/2019 0810   TRIG 66 11/06/2019 0810   HDL 43 (L) 11/06/2019 0810   CHOLHDL 2.8 11/06/2019 0810   LDLCALC 62 11/06/2019 0810   .  Preferred Learning Style:   No preference indicated   Learning Readiness:   Ready  Change in progress   MEDICATIONS:   DIETARY INTAKE:     B) 2 boiled eggs and oatmeal , water L) D)  Meat and vegetables, water  Usual physical activity: ADL  Estimated energy needs: 1200 calories 135 g carbohydrates 90 g protein 33 g fat  Progress Towards Goal(s):  In progress.   Nutritional Diagnosis:  NB-1.1 Food and nutrition-related knowledge deficit As related to Diabetes Type 2, Obesity.  As evidenced by A1C 7.8% and BMI > 40.    Intervention:  Nutrition and Diabetes education provided on My Plate, CHO counting, meal planning, portion sizes, timing of meals, avoiding snacks between meals unless having a low blood sugar, target ranges  for A1C and blood sugars, signs/symptoms and treatment of hyper/hypoglycemia, monitoring blood sugars, taking medications as prescribed, benefits of exercising 30 minutes per day and prevention of complications of DM. Marland Kitchen  Goals   Goals  Try to do more walking in the house to stay active. Keep  Up great job.  Teaching Method Utilized:  Visual Auditory Hands on  Handouts given during visit include:  The Plate Method   Diabetes Instructions.  Barriers to learning/adherence to lifestyle change: none  Demonstrated degree of understanding via:  Teach Back   Monitoring/Evaluation:  Dietary intake, exercise, , and body weight in 3 month(s).

## 2021-05-20 NOTE — Patient Instructions (Signed)
                                     Advice for Weight Management  -For most of us the best way to lose weight is by diet management. Generally speaking, diet management means consuming less calories intentionally which over time brings about progressive weight loss.  This can be achieved more effectively by restricting carbohydrate consumption to the minimum possible.  So, it is critically important to know your numbers: how much calorie you are consuming and how much calorie you need. More importantly, our carbohydrates sources should be unprocessed or minimally processed complex starch food items.   Sometimes, it is important to balance nutrition by increasing protein intake (animal or plant source), fruits, and vegetables.  -Sticking to a routine mealtime to eat 3 meals a day and avoiding unnecessary snacks is shown to have a big role in weight control. Under normal circumstances, the only time we lose real weight is when we are hungry, so allow hunger to take place- hunger means no food between meal times, only water.  It is not advisable to starve.   -It is better to avoid simple carbohydrates including: Cakes, Sweet Desserts, Ice Cream, Soda (diet and regular), Sweet Tea, Candies, Chips, Cookies, Store Bought Juices, Alcohol in Excess of  1-2 drinks a day, Lemonade,  Artificial Sweeteners, Doughnuts, Coffee Creamers, "Sugar-free" Products, etc, etc.  This is not a complete list.....    -Consulting with certified diabetes educators is proven to provide you with the most accurate and current information on diet.  Also, you may be  interested in discussing diet options/exchanges , we can schedule a visit with Natalie Castro, RDN, CDE for individualized nutrition education.  -Exercise: If you are able: 30 -60 minutes a day ,4 days a week, or 150 minutes a week.  The longer the better.  Combine stretch, strength, and aerobic activities.  If you were told in the  past that you have high risk for cardiovascular diseases, you may seek evaluation by your heart doctor prior to initiating moderate to intense exercise programs.                                  Additional Care Considerations for Diabetes   -Diabetes  is a chronic disease.  The most important care consideration is regular follow-up with your diabetes care provider with the goal being avoiding or delaying its complications and to take advantage of advances in medications and technology.    -Type 2 diabetes is known to coexist with other important comorbidities such as high blood pressure and high cholesterol.  It is critical to control not only the diabetes but also the high blood pressure and high cholesterol to minimize and delay the risk of complications including coronary artery disease, stroke, amputations, blindness, etc.    - Studies showed that people with diabetes will benefit from a class of medications known as ACE inhibitors and statins.  Unless there are specific reasons not to be on these medications, the standard of care is to consider getting one from these groups of medications at an optimal doses.  These medications are generally considered safe and proven to help protect the heart and the kidneys.    - People with diabetes are encouraged to initiate and maintain regular follow-up with eye doctors, foot   doctors, dentists , and if necessary heart and kidney doctors.     - It is highly recommended that people with diabetes quit smoking or stay away from smoking, and get yearly  flu vaccine and pneumonia vaccine at least every 5 years.  One other important lifestyle recommendation is to ensure adequate sleep - at least 6-7 hours of uninterrupted sleep at night.  -Exercise: If you are able: 30 -60 minutes a day, 4 days a week, or 150 minutes a week.  The longer the better.  Combine stretch, strength, and aerobic activities.  If you were told in the past that you have high risk for  cardiovascular diseases, you may seek evaluation by your heart doctor prior to initiating moderate to intense exercise programs.          

## 2021-05-20 NOTE — Patient Instructions (Signed)
Goals  Try to do more walking in the house to stay active. Keep great job.

## 2021-06-09 NOTE — Progress Notes (Signed)
Remote pacemaker transmission.   

## 2021-06-15 ENCOUNTER — Encounter: Payer: Self-pay | Admitting: Podiatry

## 2021-06-15 ENCOUNTER — Other Ambulatory Visit: Payer: Self-pay

## 2021-06-15 ENCOUNTER — Ambulatory Visit (INDEPENDENT_AMBULATORY_CARE_PROVIDER_SITE_OTHER): Payer: Medicare Other | Admitting: Podiatry

## 2021-06-15 DIAGNOSIS — E1142 Type 2 diabetes mellitus with diabetic polyneuropathy: Secondary | ICD-10-CM

## 2021-06-15 DIAGNOSIS — M79674 Pain in right toe(s): Secondary | ICD-10-CM | POA: Diagnosis not present

## 2021-06-15 DIAGNOSIS — B351 Tinea unguium: Secondary | ICD-10-CM | POA: Diagnosis not present

## 2021-06-15 DIAGNOSIS — L84 Corns and callosities: Secondary | ICD-10-CM

## 2021-06-15 DIAGNOSIS — M79675 Pain in left toe(s): Secondary | ICD-10-CM

## 2021-06-15 NOTE — Progress Notes (Signed)
  Subjective:  Patient ID: Natalie Castro, female    DOB: 05/12/1941,  MRN: [id number]  80 y.o. female presents with preventative diabetic foot care and painful thick toenails that are difficult to trim. Pain interferes with ambulation. Aggravating factors include wearing enclosed shoe gear. Pain is relieved with periodic professional debridement..    Patient's blood sugar was 102 mg/dl this morning.  PCP: [name], MD and last visit was: 06/14/2021.  She relates no new pedal problems on today's visit.  Review of Systems: Negative except as noted in the HPI.   Allergies  Allergen Reactions   Penicillins Hives, Itching and Other (See Comments)    Has patient had a PCN reaction causing immediate rash, facial/tongue/throat swelling, SOB or lightheadedness with hypotension: No Has patient had a PCN reaction causing severe rash involving mucus membranes or skin necrosis: Yes Has patient had a PCN reaction that required hospitalization No Has patient had a PCN reaction occurring within the last 10 years: No If all of the above answers are "NO", then may proceed with Cephalosporin use.     Objective:  There were no vitals filed for this visit. Constitutional Patient is a pleasant 80 y.o. African American female in NAD. AAO x 3.  Vascular Capillary refill time to digits immediate b/l. Faintly palpable DP pulse(s) b/l lower extremities. Faintly palpable PT pulse(s) b/l lower extremities. Pedal hair absent. Lower extremity skin temperature gradient within normal limits. No pain with calf compression b/l. Nonpitting edema noted b/l lower extremities. No cyanosis or clubbing noted.  Neurologic Normal speech. Pt has subjective symptoms of neuropathy. Protective sensation intact 5/5 intact bilaterally with 10g monofilament b/l. Vibratory sensation intact b/l.  Dermatologic Pedal skin with normal turgor, texture and tone b/l lower extremities No open wounds b/l lower extremities No  interdigital macerations b/l lower extremities Toenails 1-5 b/l elongated, discolored, dystrophic, thickened, crumbly with subungual debris and tenderness to dorsal palpation. Hyperkeratotic lesion(s) R hallux.  No erythema, no edema, no drainage, no fluctuance.  Orthopedic: Normal muscle strength 5/5 to all lower extremity muscle groups bilaterally. No pain crepitus or joint limitation noted with ROM b/l. 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 Hammertoe(s) noted to the b/l lower extremities. Utilizes rollator for ambulation assistance.   Hemoglobin A1C Latest Ref Rng & Units 05/20/2021 11/09/2020 07/06/2020  HGBA1C 0.0 - 7.0 % 7.3(A) 7.5(A) 7.7(A)  Some recent data might be hidden   Assessment:   1. Pain due to onychomycosis of toenails of both feet   2. Callus   3. Diabetic peripheral neuropathy associated with type 2 diabetes mellitus ([place])    Plan:  Patient was evaluated and treated and all questions answered.  Onychomycosis with pain -Nails palliatively debridement as below. -Educated on self-care  Procedure: Nail Debridement Rationale: Pain Type of Debridement: manual, sharp debridement. Instrumentation: Nail nipper, rotary burr. Number of Nails: 10  -Examined patient. -No new findings. No new orders. -Patient to continue soft, supportive shoe gear daily. -Toenails 1-5 b/l were debrided in length and girth with sterile nail nippers and dremel without iatrogenic bleeding.  -Callus(es) R hallux pared utilizing sterile scalpel blade without complication or incident. Total number debrided =1. -Patient to report any pedal injuries to medical professional immediately. -Patient/POA to call should there be question/concern in the interim.  Return in about 3 months (around 09/15/2021).  Marzetta Board, DPM

## 2021-06-29 NOTE — Progress Notes (Signed)
Patient in office today and seen by EJ with [facility]. Patient tried on diabetic shoes and was satisfied with the fit. Patient was educated on the break-in process at this time. Patient advised to call the office with any questions, comments, or concerns. Patient verbalized understanding.  

## 2021-07-01 ENCOUNTER — Encounter (HOSPITAL_COMMUNITY): Payer: Self-pay | Admitting: Emergency Medicine

## 2021-07-01 ENCOUNTER — Emergency Department (HOSPITAL_COMMUNITY): Payer: Medicare Other

## 2021-07-01 ENCOUNTER — Inpatient Hospital Stay (HOSPITAL_COMMUNITY)
Admission: EM | Admit: 2021-07-01 | Discharge: 2021-07-11 | DRG: 444 | Disposition: A | Discharge status: Home / Self Care | Payer: Medicare Other | Attending: Family Medicine | Admitting: Family Medicine

## 2021-07-01 ENCOUNTER — Other Ambulatory Visit: Payer: Self-pay

## 2021-07-01 DIAGNOSIS — Z7901 Long term (current) use of anticoagulants: Secondary | ICD-10-CM

## 2021-07-01 DIAGNOSIS — R7881 Bacteremia: Secondary | ICD-10-CM | POA: Diagnosis not present

## 2021-07-01 DIAGNOSIS — Z880 Allergy status to penicillin: Secondary | ICD-10-CM

## 2021-07-01 DIAGNOSIS — E1165 Type 2 diabetes mellitus with hyperglycemia: Secondary | ICD-10-CM | POA: Diagnosis present

## 2021-07-01 DIAGNOSIS — I5032 Chronic diastolic (congestive) heart failure: Secondary | ICD-10-CM | POA: Diagnosis present

## 2021-07-01 DIAGNOSIS — Z794 Long term (current) use of insulin: Secondary | ICD-10-CM

## 2021-07-01 DIAGNOSIS — E872 Acidosis: Secondary | ICD-10-CM | POA: Diagnosis present

## 2021-07-01 DIAGNOSIS — I2692 Saddle embolus of pulmonary artery without acute cor pulmonale: Secondary | ICD-10-CM | POA: Diagnosis present

## 2021-07-01 DIAGNOSIS — R509 Fever, unspecified: Secondary | ICD-10-CM

## 2021-07-01 DIAGNOSIS — R778 Other specified abnormalities of plasma proteins: Secondary | ICD-10-CM | POA: Diagnosis present

## 2021-07-01 DIAGNOSIS — Z8249 Family history of ischemic heart disease and other diseases of the circulatory system: Secondary | ICD-10-CM

## 2021-07-01 DIAGNOSIS — R079 Chest pain, unspecified: Principal | ICD-10-CM

## 2021-07-01 DIAGNOSIS — E8809 Other disorders of plasma-protein metabolism, not elsewhere classified: Secondary | ICD-10-CM | POA: Diagnosis present

## 2021-07-01 DIAGNOSIS — K810 Acute cholecystitis: Secondary | ICD-10-CM | POA: Diagnosis not present

## 2021-07-01 DIAGNOSIS — Z7984 Long term (current) use of oral hypoglycemic drugs: Secondary | ICD-10-CM

## 2021-07-01 DIAGNOSIS — E43 Unspecified severe protein-calorie malnutrition: Secondary | ICD-10-CM | POA: Diagnosis present

## 2021-07-01 DIAGNOSIS — K219 Gastro-esophageal reflux disease without esophagitis: Secondary | ICD-10-CM | POA: Diagnosis present

## 2021-07-01 DIAGNOSIS — R109 Unspecified abdominal pain: Secondary | ICD-10-CM

## 2021-07-01 DIAGNOSIS — I442 Atrioventricular block, complete: Secondary | ICD-10-CM | POA: Diagnosis present

## 2021-07-01 DIAGNOSIS — R7989 Other specified abnormal findings of blood chemistry: Secondary | ICD-10-CM | POA: Diagnosis present

## 2021-07-01 DIAGNOSIS — I249 Acute ischemic heart disease, unspecified: Secondary | ICD-10-CM | POA: Diagnosis not present

## 2021-07-01 DIAGNOSIS — I2510 Atherosclerotic heart disease of native coronary artery without angina pectoris: Secondary | ICD-10-CM | POA: Diagnosis present

## 2021-07-01 DIAGNOSIS — I471 Supraventricular tachycardia: Secondary | ICD-10-CM | POA: Diagnosis present

## 2021-07-01 DIAGNOSIS — I10 Essential (primary) hypertension: Secondary | ICD-10-CM | POA: Diagnosis present

## 2021-07-01 DIAGNOSIS — Z79899 Other long term (current) drug therapy: Secondary | ICD-10-CM

## 2021-07-01 DIAGNOSIS — A419 Sepsis, unspecified organism: Secondary | ICD-10-CM | POA: Diagnosis present

## 2021-07-01 DIAGNOSIS — Z86711 Personal history of pulmonary embolism: Secondary | ICD-10-CM | POA: Diagnosis present

## 2021-07-01 DIAGNOSIS — I110 Hypertensive heart disease with heart failure: Secondary | ICD-10-CM | POA: Diagnosis present

## 2021-07-01 DIAGNOSIS — A4151 Sepsis due to Escherichia coli [E. coli]: Secondary | ICD-10-CM | POA: Diagnosis not present

## 2021-07-01 DIAGNOSIS — Z6834 Body mass index (BMI) 34.0-34.9, adult: Secondary | ICD-10-CM

## 2021-07-01 DIAGNOSIS — B9620 Unspecified Escherichia coli [E. coli] as the cause of diseases classified elsewhere: Secondary | ICD-10-CM | POA: Diagnosis not present

## 2021-07-01 DIAGNOSIS — Z8719 Personal history of other diseases of the digestive system: Secondary | ICD-10-CM

## 2021-07-01 DIAGNOSIS — Z950 Presence of cardiac pacemaker: Secondary | ICD-10-CM

## 2021-07-01 DIAGNOSIS — N179 Acute kidney failure, unspecified: Secondary | ICD-10-CM

## 2021-07-01 DIAGNOSIS — Z20822 Contact with and (suspected) exposure to covid-19: Secondary | ICD-10-CM | POA: Diagnosis present

## 2021-07-01 DIAGNOSIS — Z96653 Presence of artificial knee joint, bilateral: Secondary | ICD-10-CM | POA: Diagnosis present

## 2021-07-01 DIAGNOSIS — K8689 Other specified diseases of pancreas: Secondary | ICD-10-CM | POA: Diagnosis present

## 2021-07-01 DIAGNOSIS — E785 Hyperlipidemia, unspecified: Secondary | ICD-10-CM | POA: Diagnosis present

## 2021-07-01 DIAGNOSIS — E441 Mild protein-calorie malnutrition: Secondary | ICD-10-CM | POA: Diagnosis present

## 2021-07-01 DIAGNOSIS — R6520 Severe sepsis without septic shock: Secondary | ICD-10-CM | POA: Diagnosis not present

## 2021-07-01 LAB — TROPONIN I (HIGH SENSITIVITY): Troponin I (High Sensitivity): 108 ng/L (ref <18)

## 2021-07-01 LAB — BASIC METABOLIC PANEL
Anion gap: 5 (ref 5–15)
BUN: 13 mg/dL (ref 8–23)
CO2: 26 mmol/L (ref 22–32)
Calcium: 8.6 mg/dL — ABNORMAL LOW (ref 8.9–10.3)
Chloride: 108 mmol/L (ref 98–111)
Creatinine, Ser: 0.72 mg/dL (ref 0.44–1.00)
GFR, Estimated: >60 mL/min (ref >60)
Glucose, Bld: 208 mg/dL — ABNORMAL HIGH (ref 70–99)
Potassium: 3.9 mmol/L (ref 3.5–5.1)
Sodium: 139 mmol/L (ref 135–145)

## 2021-07-01 LAB — CBC
HCT: 39.7 % (ref 36.0–46.0)
Hemoglobin: 12.4 g/dL (ref 12.0–15.0)
MCH: 30.4 pg (ref 26.0–34.0)
MCHC: 31.2 g/dL (ref 30.0–36.0)
MCV: 97.3 fL (ref 80.0–100.0)
Platelets: 173 10*3/uL (ref 150–400)
RBC: 4.08 MIL/uL (ref 3.87–5.11)
RDW: 14.7 % (ref 11.5–15.5)
WBC: 8.1 10*3/uL (ref 4.0–10.5)
nRBC: 0 % (ref 0.0–0.2)

## 2021-07-01 MED ORDER — NITROGLYCERIN 0.4 MG SL SUBL
0.4000 mg | SUBLINGUAL_TABLET | Freq: Once | SUBLINGUAL | Status: AC
  Administered 2021-07-01: 0.4 mg via SUBLINGUAL
  Filled 2021-07-01: qty 1

## 2021-07-01 MED ORDER — LORAZEPAM 2 MG/ML IJ SOLN: 0.5000 mg | Freq: Once | INTRAMUSCULAR | Status: AC

## 2021-07-01 MED ORDER — LORAZEPAM 2 MG/ML IJ SOLN
INTRAMUSCULAR | Status: AC
  Administered 2021-07-01: 0.5 mg via INTRAVENOUS
  Filled 2021-07-01: qty 1

## 2021-07-01 NOTE — ED Provider Notes (Signed)
[facility] Emergency Department Provider Note MRN:  [id number]  Arrival date & time: 07/02/21     Chief Complaint   Chest Pain   History of Present Illness   Natalie Castro is a 80 y.o. year-old female with a history of complete heart block, pulmonary embolism, diabetes presenting to the ED with chief complaint of chest.  Location: Center of the chest Duration: 3 to 4 hours Onset: Sudden Timing: Constant Description: Pressure Severity: Moderate Exacerbating/Alleviating Factors: None Associated Symptoms: Nausea, shortness of breath, epigastric discomfort radiating to the back Pertinent Negatives: No headache, no numbness or weakness to the arms or legs   Review of Systems  A complete 10 system review of systems was obtained and all systems are negative except as noted in the HPI and PMH.   Patient's Health History    Past Medical History:  Diagnosis Date   Acid reflux    Arthritis    Colon adenomas    AGE 34   Complete heart block (HCC)    STJ PPM Dr. 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, long term anticoag with apixaban    Hyperlipidemia    Myoview 03/2021    Myoview 4/22: EF 50, no infarct or ischemia; low risk   Presence of permanent cardiac pacemaker    Type 2 diabetes mellitus ([place])     Past Surgical History:  Procedure Laterality Date   COLONOSCOPY     3 SIMPLE ADENOMAS, AGE 48   COLONOSCOPY N/A 05/24/2018   Procedure: COLONOSCOPY;  Surgeon: Danie Binder, MD;  Location[facility];  Service: Endoscopy;  Laterality: N/A;  1:00pm   EP IMPLANTABLE DEVICE N/A 11/24/2015   Procedure: Pacemaker Implant;  Surgeon: [name], MD;  Location: [place] [facility];  Service: Cardiovascular;  Laterality: N/A;   JOINT REPLACEMENT     knees bilat.   POLYPECTOMY  05/24/2018   Procedure: POLYPECTOMY;  Surgeon: Danie Binder, MD;  Location: [facility];  Service: Endoscopy;;  ascending  and hepatic flexure, transverse   TUBAL LIGATION      Family History  Problem Relation Age of Onset   Heart failure Mother    Hypertension Mother    Heart failure Sister    Hypertension Father    Cancer Sister    Dementia Sister    Colon cancer Neg Hx    Colon polyps Neg Hx     Social History   Socioeconomic History   Marital status: Widowed    Spouse name: Not on file   Number of children: Not on file   Years of education: Not on file   Highest education level: Not on file  Occupational History   Occupation: retired  Tobacco Use   Smoking status: Never   Smokeless tobacco: Never  Vaping Use   Vaping Use: Never used  Substance and Sexual Activity   Alcohol use: No    Alcohol/week: 0.0 standard drinks   Drug use: No   Sexual activity: Yes    Birth control/protection: Post-menopausal  Other Topics Concern   Not on file  Social History Narrative   MARRIED FOR 66 YRS. 5 KIDS: #4 PRESENT TODAY(AGE 47)   Social Determinants of Health   Financial Resource Strain: Not on file  Food Insecurity: Not on file  Transportation Needs: Not on file  Physical Activity: Not on file  Stress: Not on file  Social Connections: Not on file  Intimate Partner Violence: Not on file     Physical Exam   Vitals:   07/02/21 0000 07/02/21 0030  BP: 133/63 (!) 124/50  Pulse: 80 77  Resp: 19 (!) 21  Temp:    SpO2: 94% 99%    CONSTITUTIONAL: Well-appearing, NAD NEURO:  Alert and oriented x 3, no focal deficits EYES:  eyes equal and reactive ENT/NECK:  no LAD, no JVD CARDIO: Regular rate, well-perfused, normal S1 and S2 PULM:  CTAB no wheezing or rhonchi GI/GU:  normal bowel sounds, non-distended, non-tender MSK/SPINE:  No gross deformities, no edema SKIN:  no rash, atraumatic PSYCH:  Appropriate speech and behavior  *Additional and/or pertinent findings included in MDM below  Diagnostic and Interventional Summary    EKG Interpretation  Date/Time:  Thursday July 01 2021  22:51:29 EDT Ventricular Rate:  93 PR Interval:  189 QRS Duration: 159 QT Interval:  439 QTC Calculation: 547 R Axis:   -84 Text Interpretation: Sinus or ectopic atrial rhythm Multiple premature complexes, vent & supraven Sinus pause Nonspecific IVCD with LAD Left ventricular hypertrophy Anterolateral infarct, old Probable RV involvement, suggest recording right precordial leads Confirmed by Gerlene Fee [phone]) on 07/01/2021 10:59:58 PM        Labs Reviewed  BASIC METABOLIC PANEL - Abnormal; Notable for the following components:      Result Value   Glucose, Bld 208 (*)    Calcium 8.6 (*)    All other components within normal limits  HEPATIC FUNCTION PANEL - Abnormal; Notable for the following components:   Albumin 3.2 (*)    AST 12 (*)    Indirect Bilirubin 0.2 (*)    All other components within normal limits  TROPONIN I (HIGH SENSITIVITY) - Abnormal; Notable for the following components:   Troponin I (High Sensitivity) 108 (*)    All other components within normal limits  TROPONIN I (HIGH SENSITIVITY) - Abnormal; Notable for the following components:   Troponin I (High Sensitivity) 96 (*)    All other components within normal limits  RESP PANEL BY RT-PCR (FLU A&B, COVID) ARPGX2  CBC  LIPASE, BLOOD    CT Angio Chest/Abd/Pel for Dissection W and/or Wo Contrast  Final Result    DG Chest Portable 1 View  Final Result      Medications  acetaminophen (TYLENOL) tablet 650 mg (has no administration in time range)    Or  acetaminophen (TYLENOL) suppository 650 mg (has no administration in time range)  ondansetron (ZOFRAN) tablet 4 mg (has no administration in time range)    Or  ondansetron (ZOFRAN) injection 4 mg (has no administration in time range)  LORazepam (ATIVAN) injection 0.5 mg (0.5 mg Intravenous Given 07/01/21 2258)  nitroGLYCERIN (NITROSTAT) SL tablet 0.4 mg (0.4 mg Sublingual Given 07/01/21 2332)  iohexol (OMNIPAQUE) 350 MG/ML injection 100 mL (100 mLs Intravenous  Contrast Given 07/02/21 0050)     Procedures  /  Critical Care .Critical Care  Date/Time: 07/02/2021 1:37 AM Performed by: Maudie Flakes, MD Authorized by: Maudie Flakes, MD   Critical care provider statement:    Critical care time (minutes):  35   Critical care was necessary to treat or prevent imminent or life-threatening deterioration of the following conditions: concern for acute coronary syndrome.   Critical care was time spent personally by me on the following activities:  Discussions with consultants, evaluation of patient's response to treatment, examination of patient, ordering and performing treatments and interventions, ordering and review of laboratory studies, ordering and  review of radiographic studies, pulse oximetry, re-evaluation of patient's condition, obtaining history from patient or surrogate and review of old charts  ED Course and Medical Decision Making  I have reviewed the triage vital signs, the nursing notes, and pertinent available records from the EMR.  Listed above are laboratory and imaging tests that I personally ordered, reviewed, and interpreted and then considered in my medical decision making (see below for details).  Chest pain, multiple cardiovascular risk factors, history of heart block with pacemaker, also history of saddle pulmonary embolism.  Considering ACS, less likely PE as patient states full compliance with anticoagulation.  Also having this abdominal pain with radiation to the back.  Considering pancreatitis, also considering aortic dissection.  Awaiting labs, will consider CT imaging.     Troponin is elevated at just over 100.  CT obtained to evaluate for dissection and also evaluate the pulmonary arteries for embolism.  CT is reassuring.  Repeat troponin is downtrending at 90.  Patient looking and feeling better.  Still a concern for possible unstable angina or a mild NSTEMI.  However given the improving troponin and clinical picture, admitting  to medicine and can consult cardiology in the morning.  Barth Kirks. Sedonia Small, MD [place] [email or web address]  Final Clinical Impressions(s) / ED Diagnoses     ICD-10-CM   1. Chest pain, unspecified type  R07.9       ED Discharge Orders     None        Discharge Instructions Discussed with and Provided to Patient:   Discharge Instructions   None       Maudie Flakes, MD 07/02/21 [phone]

## 2021-07-01 NOTE — ED Notes (Signed)
Date and time results received: 07/01/21 2355  Test: troponin Critical Value: 108  Name of Provider Notified: M. Bero,MD  Orders Received? Or Actions Taken?: see chart

## 2021-07-01 NOTE — ED Triage Notes (Addendum)
Pt with c/o centralized chest pain that radiates to her back. Pt also c/o nausea with it. States she was getting ready to go to bed when it started.

## 2021-07-01 NOTE — ED Notes (Signed)
Pt vomiting at present.

## 2021-07-02 ENCOUNTER — Emergency Department (HOSPITAL_COMMUNITY): Payer: Medicare Other

## 2021-07-02 ENCOUNTER — Observation Stay (HOSPITAL_COMMUNITY): Payer: Medicare Other

## 2021-07-02 ENCOUNTER — Ambulatory Visit (HOSPITAL_COMMUNITY): Admit: 2021-07-02 | Payer: Medicare Other | Admitting: Cardiovascular Disease

## 2021-07-02 ENCOUNTER — Encounter (HOSPITAL_COMMUNITY): Payer: Self-pay | Admitting: Internal Medicine

## 2021-07-02 ENCOUNTER — Inpatient Hospital Stay (HOSPITAL_COMMUNITY)
Admission: EM | Disposition: A | Discharge status: Home / Self Care | Payer: Self-pay | Source: Home / Self Care | Attending: Family Medicine

## 2021-07-02 DIAGNOSIS — I361 Nonrheumatic tricuspid (valve) insufficiency: Secondary | ICD-10-CM | POA: Diagnosis not present

## 2021-07-02 DIAGNOSIS — I471 Supraventricular tachycardia: Secondary | ICD-10-CM | POA: Diagnosis present

## 2021-07-02 DIAGNOSIS — R509 Fever, unspecified: Secondary | ICD-10-CM | POA: Diagnosis not present

## 2021-07-02 DIAGNOSIS — K819 Cholecystitis, unspecified: Secondary | ICD-10-CM | POA: Diagnosis not present

## 2021-07-02 DIAGNOSIS — I340 Nonrheumatic mitral (valve) insufficiency: Secondary | ICD-10-CM

## 2021-07-02 DIAGNOSIS — Z794 Long term (current) use of insulin: Secondary | ICD-10-CM | POA: Diagnosis not present

## 2021-07-02 DIAGNOSIS — Z950 Presence of cardiac pacemaker: Secondary | ICD-10-CM

## 2021-07-02 DIAGNOSIS — I249 Acute ischemic heart disease, unspecified: Secondary | ICD-10-CM | POA: Diagnosis present

## 2021-07-02 DIAGNOSIS — E441 Mild protein-calorie malnutrition: Secondary | ICD-10-CM | POA: Diagnosis present

## 2021-07-02 DIAGNOSIS — R778 Other specified abnormalities of plasma proteins: Secondary | ICD-10-CM | POA: Diagnosis not present

## 2021-07-02 DIAGNOSIS — K8689 Other specified diseases of pancreas: Secondary | ICD-10-CM | POA: Diagnosis present

## 2021-07-02 DIAGNOSIS — K219 Gastro-esophageal reflux disease without esophagitis: Secondary | ICD-10-CM | POA: Diagnosis present

## 2021-07-02 DIAGNOSIS — E785 Hyperlipidemia, unspecified: Secondary | ICD-10-CM | POA: Diagnosis present

## 2021-07-02 DIAGNOSIS — R079 Chest pain, unspecified: Secondary | ICD-10-CM | POA: Diagnosis present

## 2021-07-02 DIAGNOSIS — E43 Unspecified severe protein-calorie malnutrition: Secondary | ICD-10-CM | POA: Diagnosis present

## 2021-07-02 DIAGNOSIS — Z7901 Long term (current) use of anticoagulants: Secondary | ICD-10-CM | POA: Diagnosis not present

## 2021-07-02 DIAGNOSIS — Z79899 Other long term (current) drug therapy: Secondary | ICD-10-CM | POA: Diagnosis not present

## 2021-07-02 DIAGNOSIS — Z7984 Long term (current) use of oral hypoglycemic drugs: Secondary | ICD-10-CM | POA: Diagnosis not present

## 2021-07-02 DIAGNOSIS — I110 Hypertensive heart disease with heart failure: Secondary | ICD-10-CM | POA: Diagnosis present

## 2021-07-02 DIAGNOSIS — E8809 Other disorders of plasma-protein metabolism, not elsewhere classified: Secondary | ICD-10-CM | POA: Diagnosis present

## 2021-07-02 DIAGNOSIS — R7881 Bacteremia: Secondary | ICD-10-CM | POA: Diagnosis not present

## 2021-07-02 DIAGNOSIS — I442 Atrioventricular block, complete: Secondary | ICD-10-CM | POA: Diagnosis present

## 2021-07-02 DIAGNOSIS — Z20822 Contact with and (suspected) exposure to covid-19: Secondary | ICD-10-CM | POA: Diagnosis present

## 2021-07-02 DIAGNOSIS — K810 Acute cholecystitis: Secondary | ICD-10-CM | POA: Diagnosis present

## 2021-07-02 DIAGNOSIS — I2510 Atherosclerotic heart disease of native coronary artery without angina pectoris: Secondary | ICD-10-CM | POA: Diagnosis present

## 2021-07-02 DIAGNOSIS — E872 Acidosis: Secondary | ICD-10-CM | POA: Diagnosis present

## 2021-07-02 DIAGNOSIS — A4151 Sepsis due to Escherichia coli [E. coli]: Secondary | ICD-10-CM | POA: Diagnosis not present

## 2021-07-02 DIAGNOSIS — N179 Acute kidney failure, unspecified: Secondary | ICD-10-CM | POA: Diagnosis present

## 2021-07-02 DIAGNOSIS — E1165 Type 2 diabetes mellitus with hyperglycemia: Secondary | ICD-10-CM | POA: Diagnosis present

## 2021-07-02 DIAGNOSIS — R6520 Severe sepsis without septic shock: Secondary | ICD-10-CM | POA: Diagnosis not present

## 2021-07-02 DIAGNOSIS — I5032 Chronic diastolic (congestive) heart failure: Secondary | ICD-10-CM | POA: Diagnosis present

## 2021-07-02 DIAGNOSIS — Z6834 Body mass index (BMI) 34.0-34.9, adult: Secondary | ICD-10-CM | POA: Diagnosis not present

## 2021-07-02 DIAGNOSIS — I10 Essential (primary) hypertension: Secondary | ICD-10-CM | POA: Diagnosis not present

## 2021-07-02 DIAGNOSIS — R109 Unspecified abdominal pain: Secondary | ICD-10-CM | POA: Diagnosis not present

## 2021-07-02 HISTORY — PX: LEFT HEART CATH AND CORONARY ANGIOGRAPHY: CATH118249

## 2021-07-02 LAB — ECHOCARDIOGRAM COMPLETE
AR max vel: 1.66 cm2
AV Area VTI: 1.6 cm2
AV Area mean vel: 1.54 cm2
AV Mean grad: 4 mmHg
AV Peak grad: 7.8 mmHg
Ao pk vel: 1.4 m/s
Area-P 1/2: 2.71 cm2
Calc EF: 55.9 %
Height: 65 in
MV M vel: 4.9 m/s
MV Peak grad: 96 mmHg
MV VTI: 1.53 cm2
Radius: 0.5 cm
S' Lateral: 3.24 cm
Single Plane A2C EF: 56.5 %
Single Plane A4C EF: 51.6 %
Weight: 3358.05 oz

## 2021-07-02 LAB — LIPASE, BLOOD: Lipase: 37 U/L (ref 11–51)

## 2021-07-02 LAB — HEPATIC FUNCTION PANEL
ALT: 10 U/L (ref 0–44)
AST: 12 U/L — ABNORMAL LOW (ref 15–41)
Albumin: 3.2 g/dL — ABNORMAL LOW (ref 3.5–5.0)
Alkaline Phosphatase: 73 U/L (ref 38–126)
Bilirubin, Direct: 0.1 mg/dL (ref 0.0–0.2)
Indirect Bilirubin: 0.2 mg/dL — ABNORMAL LOW (ref 0.3–0.9)
Total Bilirubin: 0.3 mg/dL (ref 0.3–1.2)
Total Protein: 7.3 g/dL (ref 6.5–8.1)

## 2021-07-02 LAB — GLUCOSE, CAPILLARY
Glucose-Capillary: 117 mg/dL — ABNORMAL HIGH (ref 70–99)
Glucose-Capillary: 189 mg/dL — ABNORMAL HIGH (ref 70–99)
Glucose-Capillary: 108 mg/dL — ABNORMAL HIGH (ref 70–99)
Glucose-Capillary: 196 mg/dL — ABNORMAL HIGH (ref 70–99)

## 2021-07-02 LAB — MAGNESIUM: Magnesium: 1.6 mg/dL — ABNORMAL LOW (ref 1.7–2.4)

## 2021-07-02 LAB — TROPONIN I (HIGH SENSITIVITY): Troponin I (High Sensitivity): 96 ng/L — ABNORMAL HIGH (ref <18)

## 2021-07-02 LAB — RESP PANEL BY RT-PCR (FLU A&B, COVID) ARPGX2
Influenza A by PCR: NEGATIVE
Influenza B by PCR: NEGATIVE
SARS Coronavirus 2 by RT PCR: NEGATIVE

## 2021-07-02 LAB — HEMOGLOBIN A1C
Hgb A1c MFr Bld: 7.4 % — ABNORMAL HIGH (ref 4.8–5.6)
Mean Plasma Glucose: 165.68 mg/dL

## 2021-07-02 SURGERY — LEFT HEART CATH AND CORONARY ANGIOGRAPHY
Anesthesia: LOCAL

## 2021-07-02 MED ORDER — LOSARTAN POTASSIUM 50 MG PO TABS
100.0000 mg | ORAL_TABLET | Freq: Every day | ORAL | Status: DC
  Administered 2021-07-02 – 2021-07-11 (×10): 100 mg via ORAL
  Filled 2021-07-02 (×10): qty 2

## 2021-07-02 MED ORDER — HYDRALAZINE HCL 20 MG/ML IJ SOLN: 10.0000 mg | INTRAMUSCULAR | Status: AC | PRN

## 2021-07-02 MED ORDER — ACETAMINOPHEN 325 MG PO TABS: 650.0000 mg | ORAL_TABLET | ORAL | Status: DC | PRN

## 2021-07-02 MED ORDER — SODIUM CHLORIDE 0.9 % IV SOLN: INTRAVENOUS | Status: DC

## 2021-07-02 MED ORDER — HEPARIN SODIUM (PORCINE) 1000 UNIT/ML IJ SOLN
INTRAMUSCULAR | Status: AC
  Filled 2021-07-02: qty 1

## 2021-07-02 MED ORDER — HEPARIN (PORCINE) IN NACL 1000-0.9 UT/500ML-% IV SOLN
INTRAVENOUS | Status: AC
  Filled 2021-07-02: qty 500

## 2021-07-02 MED ORDER — SODIUM CHLORIDE 0.9% FLUSH: 3.0000 mL | INTRAVENOUS | Status: DC | PRN

## 2021-07-02 MED ORDER — INSULIN GLARGINE 100 UNIT/ML ~~LOC~~ SOLN
15.0000 [IU] | Freq: Every day | SUBCUTANEOUS | Status: DC
  Administered 2021-07-03 – 2021-07-04 (×3): 15 [IU] via SUBCUTANEOUS
  Filled 2021-07-02 (×7): qty 0.15

## 2021-07-02 MED ORDER — INSULIN GLARGINE 100 UNIT/ML SOLOSTAR PEN: 30.0000 [IU] | PEN_INJECTOR | Freq: Every day | SUBCUTANEOUS | Status: DC

## 2021-07-02 MED ORDER — ONDANSETRON HCL 4 MG/2ML IJ SOLN
4.0000 mg | Freq: Four times a day (QID) | INTRAMUSCULAR | Status: DC | PRN
  Administered 2021-07-02 – 2021-07-03 (×2): 4 mg via INTRAVENOUS
  Filled 2021-07-02 (×2): qty 2

## 2021-07-02 MED ORDER — VERAPAMIL HCL 2.5 MG/ML IV SOLN
INTRAVENOUS | Status: DC | PRN
  Administered 2021-07-02: 10 mL via INTRA_ARTERIAL

## 2021-07-02 MED ORDER — IOHEXOL 350 MG/ML SOLN
100.0000 mL | Freq: Once | INTRAVENOUS | Status: AC | PRN
  Administered 2021-07-02: 100 mL via INTRAVENOUS

## 2021-07-02 MED ORDER — INSULIN ASPART 100 UNIT/ML IJ SOLN
0.0000 [IU] | Freq: Three times a day (TID) | INTRAMUSCULAR | Status: DC
  Administered 2021-07-02: 3 [IU] via SUBCUTANEOUS
  Administered 2021-07-03: 8 [IU] via SUBCUTANEOUS
  Administered 2021-07-03 (×2): 2 [IU] via SUBCUTANEOUS
  Administered 2021-07-04 (×2): 3 [IU] via SUBCUTANEOUS
  Administered 2021-07-04 – 2021-07-05 (×2): 5 [IU] via SUBCUTANEOUS
  Administered 2021-07-05: 3 [IU] via SUBCUTANEOUS
  Administered 2021-07-05 – 2021-07-08 (×3): 2 [IU] via SUBCUTANEOUS
  Administered 2021-07-10: 3 [IU] via SUBCUTANEOUS
  Administered 2021-07-10 – 2021-07-11 (×2): 2 [IU] via SUBCUTANEOUS

## 2021-07-02 MED ORDER — LABETALOL HCL 5 MG/ML IV SOLN: 10.0000 mg | INTRAVENOUS | Status: AC | PRN

## 2021-07-02 MED ORDER — FENTANYL CITRATE (PF) 100 MCG/2ML IJ SOLN
INTRAMUSCULAR | Status: DC | PRN
  Administered 2021-07-02: 25 ug via INTRAVENOUS

## 2021-07-02 MED ORDER — IOHEXOL 350 MG/ML SOLN
INTRAVENOUS | Status: DC | PRN
  Administered 2021-07-02: 55 mL

## 2021-07-02 MED ORDER — SODIUM CHLORIDE 0.9 % IV SOLN: 250.0000 mL | INTRAVENOUS | Status: DC | PRN

## 2021-07-02 MED ORDER — HEPARIN (PORCINE) IN NACL 1000-0.9 UT/500ML-% IV SOLN
INTRAVENOUS | Status: DC | PRN
  Administered 2021-07-02: 500 mL

## 2021-07-02 MED ORDER — MIDAZOLAM HCL 2 MG/2ML IJ SOLN
INTRAMUSCULAR | Status: DC | PRN
  Administered 2021-07-02: 1 mg via INTRAVENOUS

## 2021-07-02 MED ORDER — MORPHINE SULFATE (PF) 2 MG/ML IV SOLN
2.0000 mg | INTRAVENOUS | Status: DC | PRN
Start: 2021-07-02 — End: 2021-07-11

## 2021-07-02 MED ORDER — ASPIRIN EC 81 MG PO TBEC
81.0000 mg | DELAYED_RELEASE_TABLET | Freq: Every day | ORAL | Status: DC
  Administered 2021-07-02 – 2021-07-03 (×2): 81 mg via ORAL
  Filled 2021-07-02 (×3): qty 1

## 2021-07-02 MED ORDER — SODIUM CHLORIDE 0.9% FLUSH
3.0000 mL | Freq: Two times a day (BID) | INTRAVENOUS | Status: DC
  Administered 2021-07-03 – 2021-07-11 (×8): 3 mL via INTRAVENOUS

## 2021-07-02 MED ORDER — ACETAMINOPHEN 325 MG PO TABS
650.0000 mg | ORAL_TABLET | Freq: Four times a day (QID) | ORAL | Status: DC | PRN
  Administered 2021-07-02 – 2021-07-08 (×9): 650 mg via ORAL
  Filled 2021-07-02 (×8): qty 2

## 2021-07-02 MED ORDER — METOPROLOL SUCCINATE ER 25 MG PO TB24
25.0000 mg | ORAL_TABLET | Freq: Every day | ORAL | Status: DC
  Administered 2021-07-02 – 2021-07-11 (×10): 25 mg via ORAL
  Filled 2021-07-02 (×10): qty 1

## 2021-07-02 MED ORDER — LIDOCAINE HCL (PF) 1 % IJ SOLN
INTRAMUSCULAR | Status: AC
  Filled 2021-07-02: qty 30

## 2021-07-02 MED ORDER — MIDAZOLAM HCL 2 MG/2ML IJ SOLN
INTRAMUSCULAR | Status: AC
  Filled 2021-07-02: qty 2

## 2021-07-02 MED ORDER — VERAPAMIL HCL 2.5 MG/ML IV SOLN
INTRAVENOUS | Status: AC
  Filled 2021-07-02: qty 2

## 2021-07-02 MED ORDER — ATORVASTATIN CALCIUM 40 MG PO TABS
40.0000 mg | ORAL_TABLET | Freq: Every day | ORAL | Status: DC
  Administered 2021-07-02 – 2021-07-11 (×10): 40 mg via ORAL
  Filled 2021-07-02 (×10): qty 1

## 2021-07-02 MED ORDER — HEPARIN (PORCINE) IN NACL 1000-0.9 UT/500ML-% IV SOLN
INTRAVENOUS | Status: AC
  Filled 2021-07-02: qty 1000

## 2021-07-02 MED ORDER — ONDANSETRON HCL 4 MG PO TABS
4.0000 mg | ORAL_TABLET | Freq: Four times a day (QID) | ORAL | Status: DC | PRN
  Administered 2021-07-05: 4 mg via ORAL
  Filled 2021-07-02: qty 1

## 2021-07-02 MED ORDER — ONDANSETRON HCL 4 MG/2ML IJ SOLN: 4.0000 mg | Freq: Four times a day (QID) | INTRAMUSCULAR | Status: DC | PRN

## 2021-07-02 MED ORDER — SODIUM CHLORIDE 0.9 % IV SOLN: INTRAVENOUS | Status: AC

## 2021-07-02 MED ORDER — FENTANYL CITRATE (PF) 100 MCG/2ML IJ SOLN
INTRAMUSCULAR | Status: AC
  Filled 2021-07-02: qty 2

## 2021-07-02 MED ORDER — NITROGLYCERIN 0.4 MG SL SUBL: 0.4000 mg | SUBLINGUAL_TABLET | SUBLINGUAL | Status: DC | PRN

## 2021-07-02 MED ORDER — HEPARIN SODIUM (PORCINE) 1000 UNIT/ML IJ SOLN
INTRAMUSCULAR | Status: DC | PRN
  Administered 2021-07-02: 5000 [IU] via INTRAVENOUS

## 2021-07-02 MED ORDER — ACETAMINOPHEN 650 MG RE SUPP: 650.0000 mg | Freq: Four times a day (QID) | RECTAL | Status: DC | PRN

## 2021-07-02 MED ORDER — HYDRALAZINE HCL 50 MG PO TABS
50.0000 mg | ORAL_TABLET | Freq: Two times a day (BID) | ORAL | Status: DC
  Administered 2021-07-02 – 2021-07-03 (×3): 50 mg via ORAL
  Filled 2021-07-02: qty 2
  Filled 2021-07-02 (×2): qty 1

## 2021-07-02 MED ORDER — LIDOCAINE HCL (PF) 1 % IJ SOLN
INTRAMUSCULAR | Status: DC | PRN
Start: 2021-07-02 — End: 2021-07-02
  Administered 2021-07-02: 2 mL

## 2021-07-02 MED ORDER — SODIUM CHLORIDE 0.9% FLUSH
3.0000 mL | Freq: Two times a day (BID) | INTRAVENOUS | Status: DC
Start: 2021-07-02 — End: 2021-07-03

## 2021-07-02 SURGICAL SUPPLY — 9 items
CATH 5FR JL3.5 JR4 ANG PIG MP (CATHETERS) ×1 IMPLANT
DEVICE RAD COMP TR BAND LRG (VASCULAR PRODUCTS) ×1 IMPLANT
GLIDESHEATH SLEND SS 6F .021 (SHEATH) ×1 IMPLANT
KIT HEART LEFT (KITS) ×2 IMPLANT
MAT PREVALON FULL STRYKER (MISCELLANEOUS) ×1 IMPLANT
PACK CARDIAC CATHETERIZATION (CUSTOM PROCEDURE TRAY) ×2 IMPLANT
TRANSDUCER W/STOPCOCK (MISCELLANEOUS) ×2 IMPLANT
TUBING CIL FLEX 10 FLL-RA (TUBING) ×2 IMPLANT
WIRE HI TORQ VERSACORE J 260CM (WIRE) ×1 IMPLANT

## 2021-07-02 NOTE — ED Notes (Signed)
ED Provider at bedside. 

## 2021-07-02 NOTE — Interval H&P Note (Signed)
Cath Lab Visit (complete for each Cath Lab visit)  Clinical Evaluation Leading to the Procedure:   ACS: Yes.    Non-ACS:    Anginal Classification: CCS IV  Anti-ischemic medical therapy: No Therapy  Non-Invasive Test Results: No non-invasive testing performed  Prior CABG: No previous CABG      History and Physical Interval Note:  07/02/2021 4:03 PM  Natalie Castro  has presented today for surgery, with the diagnosis of cp.  The various methods of treatment have been discussed with the patient and family. After consideration of risks, benefits and other options for treatment, the patient has consented to  Procedure(s): LEFT HEART CATH AND CORONARY ANGIOGRAPHY (N/A) as a surgical intervention.  The patient's history has been reviewed, patient examined, no change in status, stable for surgery.  I have reviewed the patient's chart and labs.  Questions were answered to the patient's satisfaction.     Larae Grooms

## 2021-07-02 NOTE — H&P (View-Only) (Signed)
Cardiology Consultation:   Patient ID: Natalie Castro MRN: [id number]; DOB: 03-12-1941  Admit date: 07/01/2021 Date of Consult: 07/02/2021  PCP:  [name], MD   [facility] Providers Cardiologist:  Cristopher Peru, MD        Patient Profile:   Natalie Castro is a 80 y.o. female with a past medical history of CHB (s/p St. Jude PPM placement), HTN, HLD, Type 2 DM and history of PE (diagnosed in 2017 and has remained on long-term anticoagulation) who is being seen today for the evaluation of chest pain at the request of Dr. Roderic Palau.  History of Present Illness:   Ms. Puello was evaluated in the ED in 02/2021 for chest pain and troponin values were negative and her EKG showed no acute ST changes. She did follow-up with Richardson Dopp, PA-C and a stress test was recommended for further evaluation. This was performed on 03/30/2021 and showed no evidence of prior infarct or current ischemia and was overall a low risk study. EF was in the low-normal range of 50%. She did follow-up with Dr. Lovena Le in 04/2021 and denied any recurrent pain at that current time. She did report intermittent palpitations which is felt to be most consistent with atrial tachycardia.  She presented to [facility] ED on 07/01/2021 for evaluation of chest discomfort which radiated into her back with associated nausea. In talking with the patient today, she reports being in her normal state of health until yesterday afternoon when she developed some palpitations while working in her garden. She reports it was hot outside at the time but she had been trying to stay hydrated.  Last night, around the time she was starting to go to bed, she developed a tightness along her pectoral region which radiated into her back. Pain was not associated with exertion or positional changes. Reports associated nausea at that time. Symptoms lasted for over an hour and improved while in the ED after having received SL NTG. Denies any recurrent pain  overnight or this AM. Feels back to baseline and anxious to go home as she has a family reunion tomorrow.   Initial labs show WBC 8.1, Hgb 12.4, platelets 173, Na+ 139, K+ 3.9 and creatinine 0.72. COVID negative. Hgb A1c 7.4. Initial Hs Troponin 108 with repeat of 96. CXR with no active cardiopulmonary disease. CTA showed moderate diffuse aortic calcifications but no evidence of aortic aneurysm or dissection. Was noted to have a dilated pancreatic duct without an identifiable mass and nonemergent MRCP was recommended for further evaluation in the future. EKG shows V-pacing with LBBB and PVC's.   Past Medical History:  Diagnosis Date   Acid reflux    Arthritis    Colon adenomas    AGE 81   Complete heart block (HCC)    STJ PPM Dr. 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, long term anticoag with apixaban    Hyperlipidemia    Myoview 03/2021    Myoview 4/22: EF 50, no infarct or ischemia; low risk   Presence of permanent cardiac pacemaker    Type 2 diabetes mellitus ([place])     Past Surgical History:  Procedure Laterality Date   COLONOSCOPY     3 SIMPLE ADENOMAS, AGE 70   COLONOSCOPY N/A 05/24/2018   Procedure: COLONOSCOPY;  Surgeon: Danie Binder, MD;  Location: [facility];  Service: Endoscopy;  Laterality: N/A;  1:00pm   EP IMPLANTABLE DEVICE N/A 11/24/2015  Procedure: Pacemaker Implant;  Surgeon: [name], MD;  Location: [facility];  Service: Cardiovascular;  Laterality: N/A;   JOINT REPLACEMENT     knees bilat.   POLYPECTOMY  05/24/2018   Procedure: POLYPECTOMY;  Surgeon: Danie Binder, MD;  Location: [facility];  Service: Endoscopy;;  ascending and hepatic flexure, transverse   TUBAL LIGATION      Home Medications:  Prior to Admission medications   Medication Sig Start Date End Date Taking? Authorizing Provider  atorvastatin (LIPITOR) 40 MG tablet TAKE (1) TABLET BY MOUTH AT BEDTIME. Patient taking  differently: Take 40 mg by mouth daily. 05/28/20   Corum, Rex Kras, MD  ELIQUIS 5 MG TABS tablet TAKE 1 TABLET BY MOUTH TWICE A DAY. Patient taking differently: Take 5 mg by mouth 2 (two) times daily. 05/05/20   Corum, Rex Kras, MD  glucose blood test strip Use to check blood glucose fasting , before lunch and dinner and after your largest meal 11/04/19   Corum, Rex Kras, MD  hydrALAZINE (APRESOLINE) 50 MG tablet Take 50 mg by mouth 2 (two) times daily.    [provider]  Lancets Glory Rosebush ULTRASOFT) lancets Use to check glucose 4 x daily 11/04/19   Corum, Rex Kras, MD  LANTUS SOLOSTAR 100 UNIT/ML Solostar Pen Inject 30 Units into the skin at bedtime. 07/06/20   Cassandria Anger, MD  losartan (COZAAR) 100 MG tablet TAKE (1) TABLET BY MOUTH ONCE DAILY. Patient taking differently: Take 100 mg by mouth daily. 05/28/20   Corum, Rex Kras, MD  metFORMIN (GLUCOPHAGE) 1000 MG tablet TAKE (1) TABLET BY MOUTH TWICE DAILY. 05/17/21   Cassandria Anger, MD  metoprolol succinate (TOPROL-XL) 25 MG 24 hr tablet Take 25 mg by mouth daily. 04/03/20   [provider]  NIFEdipine (ADALAT CC) 30 MG 24 hr tablet Take 30 mg by mouth daily. 05/17/21   [provider]  NIFEdipine (PROCARDIA-XL/NIFEDICAL-XL) 30 MG 24 hr tablet TAKE 1 TABLET BY MOUTH ONCE A DAY. Patient taking differently: Take 30 mg by mouth daily. 05/28/20   Corum, Rex Kras, MD  nitroGLYCERIN (NITROSTAT) 0.4 MG SL tablet Place 1 tablet (0.4 mg total) under the tongue every 5 (five) minutes as needed for chest pain. 02/24/21 05/25/21  Liliane Shi, PA-C    Inpatient Medications: Scheduled Meds:  aspirin EC  81 mg Oral Daily   atorvastatin  40 mg Oral Daily   hydrALAZINE  50 mg Oral BID   insulin aspart  0-15 Units Subcutaneous TID WC   losartan  100 mg Oral Daily   metoprolol succinate  25 mg Oral Daily   Continuous Infusions:  PRN Meds: acetaminophen **OR** acetaminophen, morphine injection, nitroGLYCERIN, ondansetron **OR**  ondansetron (ZOFRAN) IV  Allergies:    Allergies  Allergen Reactions   Penicillins Hives, Itching and Other (See Comments)    Has patient had a PCN reaction causing immediate rash, facial/tongue/throat swelling, SOB or lightheadedness with hypotension: No Has patient had a PCN reaction causing severe rash involving mucus membranes or skin necrosis: Yes Has patient had a PCN reaction that required hospitalization No Has patient had a PCN reaction occurring within the last 10 years: No If all of the above answers are "NO", then may proceed with Cephalosporin use.     Social History:   Social History   Socioeconomic History   Marital status: Widowed    Spouse name: Not on file   Number of children: Not on file   Years  of education: Not on file   Highest education level: Not on file  Occupational History   Occupation: retired  Tobacco Use   Smoking status: Never   Smokeless tobacco: Never  Vaping Use   Vaping Use: Never used  Substance and Sexual Activity   Alcohol use: No    Alcohol/week: 0.0 standard drinks   Drug use: No   Sexual activity: Yes    Birth control/protection: Post-menopausal  Other Topics Concern   Not on file  Social History Narrative   MARRIED FOR 33 YRS. 5 KIDS: #4 PRESENT TODAY(AGE 56)   Social Determinants of Health   Financial Resource Strain: Not on file  Food Insecurity: Not on file  Transportation Needs: Not on file  Physical Activity: Not on file  Stress: Not on file  Social Connections: Not on file  Intimate Partner Violence: Not on file    Family History:    Family History  Problem Relation Age of Onset   Heart failure Mother    Hypertension Mother    Heart failure Sister    Hypertension Father    Cancer Sister    Dementia Sister    Colon cancer Neg Hx    Colon polyps Neg Hx      ROS:  Please see the history of present illness.   All other ROS reviewed and negative.     Physical Exam/Data:   Vitals:   07/02/21 0143  07/02/21 0200 07/02/21 0238 07/02/21 0527  BP: (!) 158/70 (!) 151/67 (!) 159/69 (!) 141/88  Pulse: 81 72 79 80  Resp: 15 20 18 18   Temp:  98.5 F (36.9 C) 98.5 F (36.9 C) 97.8 F (36.6 C)  TempSrc:  Oral Oral Oral  SpO2: 100% 97% 99% 100%  Weight:   95.2 kg   Height:   5\' 5"  (1.651 m)    No intake or output data in the 24 hours ending 07/02/21 1050 Last 3 Weights 07/02/2021 07/01/2021 05/20/2021  Weight (lbs) 209 lb 14.1 oz 212 lb 209 lb 6.4 oz  Weight (kg) 95.2 kg 96.163 kg 94.983 kg     Body mass index is 34.93 kg/m.  General:  Well nourished, well developed female appearing in no acute distress. HEENT: normal Lymph: no adenopathy Neck: no JVD Endocrine:  No thryomegaly Vascular: No carotid bruits; FA pulses 2+ bilaterally without bruits  Cardiac:  normal S1, S2; RRR; no murmur  Lungs:  clear to auscultation bilaterally, no wheezing, rhonchi or rales  Abd: soft, nontender, no hepatomegaly  Ext: Trace lower extremity edema.  Musculoskeletal:  No deformities, BUE and BLE strength normal and equal Skin: warm and dry  Neuro:  CNs 2-12 intact, no focal abnormalities noted Psych:  Normal affect   EKG:  The EKG was personally reviewed and demonstrates: V-pacing with PVC's.  Telemetry:  Telemetry was personally reviewed and demonstrates: V-pacing, HR in 70's. One episode of 9 beats NSVT.   Relevant CV Studies:  Echocardiogram: 12/2017 Study Conclusions   - Left ventricle: The cavity size was normal. Wall thickness was    increased in a pattern of moderate LVH. Systolic function was    normal. The estimated ejection fraction was in the range of 50%    to 55%. Although no diagnostic regional wall motion abnormality    was identified, this possibility cannot be completely excluded on    the basis of this study. Doppler parameters are consistent with    abnormal left ventricular relaxation (grade 1 diastolic  dysfunction).  - Aortic valve: Mildly calcified annulus. Trileaflet.   - Mitral valve: Mildly calcified annulus. There was trivial    regurgitation.  - Right ventricle: Pacer wire or catheter noted in right ventricle.  - Right atrium: Pacer wire or catheter noted in right atrium.  - Atrial septum: No defect or patent foramen ovale was identified.  - Tricuspid valve: There was trivial regurgitation.  - Pulmonary arteries: Systolic pressure could not be accurately    estimated.  - Pericardium, extracardiac: A trivial pericardial effusion was    identified posterior to the heart.   NST: 03/2021 There was no ST segment deviation noted during stress. The study is normal. There are no perfusion defects consistent with prior infarct or current ischemia. This is a low risk study. The left ventricular ejection fraction is low normal (50%).  Laboratory Data:  High Sensitivity Troponin:   Recent Labs  Lab 07/01/21 2247 07/02/21 0041  TROPONINIHS 108* 96*     Chemistry Recent Labs  Lab 07/01/21 2247  NA 139  K 3.9  CL 108  CO2 26  GLUCOSE 208*  BUN 13  CREATININE 0.72  CALCIUM 8.6*  GFRNONAA >60  ANIONGAP 5    Recent Labs  Lab 07/01/21 2247  PROT 7.3  ALBUMIN 3.2*  AST 12*  ALT 10  ALKPHOS 73  BILITOT 0.3   Hematology Recent Labs  Lab 07/01/21 2247  WBC 8.1  RBC 4.08  HGB 12.4  HCT 39.7  MCV 97.3  MCH 30.4  MCHC 31.2  RDW 14.7  PLT 173   BNPNo results for input(s): BNP, PROBNP in the last 168 hours.  DDimer No results for input(s): DDIMER in the last 168 hours.   Radiology/Studies:  DG Chest Portable 1 View  Result Date: 07/01/2021 CLINICAL DATA:  Centralized chest pain radiating to the back. Nausea and vomiting. EXAM: PORTABLE CHEST 1 VIEW COMPARISON:  02/23/2021 FINDINGS: Cardiac pacemaker. Borderline heart size with normal pulmonary vascularity. No airspace disease or consolidation in the lungs. No pleural effusions. No pneumothorax. Mediastinal contours appear intact. Calcification of the aorta. IMPRESSION: Borderline  heart size.  No active pulmonary disease. Electronically Signed   By: Lucienne Capers M.D.   On: 07/01/2021 23:23   CT Angio Chest/Abd/Pel for Dissection W and/or Wo Contrast  Result Date: 07/02/2021 CLINICAL DATA:  Centralized chest pain radiating to the back. Nausea. EXAM: CT ANGIOGRAPHY CHEST, ABDOMEN AND PELVIS TECHNIQUE: Non-contrast CT of the chest was initially obtained. Multidetector CT imaging through the chest, abdomen and pelvis was performed using the standard protocol during bolus administration of intravenous contrast. Multiplanar reconstructed images and MIPs were obtained and reviewed to evaluate the vascular anatomy. CONTRAST:  110mL OMNIPAQUE IOHEXOL 350 MG/ML SOLN COMPARISON:  CT chest 11/04/2016 FINDINGS: CTA CHEST FINDINGS Cardiovascular: Noncontrast images of the chest demonstrate scattered calcification in the aorta. Mild coronary artery calcifications. No evidence of intramural hematoma. Images obtained during the arterial phase after contrast injection demonstrates normal caliber thoracic aorta. No aortic dissection. Great vessel origins are patent. No evidence of pulmonary embolus. Normal heart size. No pericardial effusion. Cardiac pacemaker. Mediastinum/Nodes: No enlarged mediastinal, hilar, or axillary lymph nodes. Thyroid gland, trachea, and esophagus demonstrate no significant findings. Lungs/Pleura: Mild atelectasis in the lung bases. Lungs are otherwise clear. No pleural effusions. No pneumothorax. Airways are patent. Musculoskeletal: Degenerative changes in the spine. No destructive bone lesions. Review of the MIP images confirms the above findings. CTA ABDOMEN AND PELVIS FINDINGS VASCULAR Aorta: Diffuse aortic calcification. No aneurysm  or dissection. No critical stenosis. Celiac: The celiac axis is patent although there is evidence of focal stenosis at the origin. SMA: Patent without evidence of aneurysm, dissection, vasculitis or significant stenosis. Renals: Both renal  arteries are patent without evidence of aneurysm, dissection, vasculitis, fibromuscular dysplasia or significant stenosis. IMA: Patent without evidence of aneurysm, dissection, vasculitis or significant stenosis. Inflow: Patent without evidence of aneurysm, dissection, vasculitis or significant stenosis. Veins: No obvious venous abnormality within the limitations of this arterial phase study. Review of the MIP images confirms the above findings. NON-VASCULAR Hepatobiliary: Several low-attenuation lesions in the liver most likely representing cysts. Gallbladder is mildly distended without stone or wall thickening. No bile duct dilatation. Pancreas: Mildly dilated pancreatic duct. No discrete mass is identified. Consider nonemergent MRCP for follow-up. Spleen: Normal in size without focal abnormality. Adrenals/Urinary Tract: Adrenal glands are unremarkable. Kidneys are normal, without renal calculi, focal lesion, or hydronephrosis. Bladder is unremarkable. Stomach/Bowel: Stomach is within normal limits. Appendix appears normal. No evidence of bowel wall thickening, distention, or inflammatory changes. Lymphatic: No significant lymphadenopathy. Reproductive: Uterus and ovaries are not enlarged. Other: No free air or free fluid in the abdomen. Small periumbilical hernia containing fat. Midline abdominal wall scarring is likely postoperative. Musculoskeletal: Degenerative changes in the spine. No destructive bone lesions. Review of the MIP images confirms the above findings. IMPRESSION: 1. Moderate diffuse aortic calcifications/atherosclerosis. No evidence of aortic aneurysm or dissection. 2. No evidence of active pulmonary disease. 3. Dilated pancreatic duct without discrete mass identified. Consider nonemergent MRCP for follow-up. 4. Small periumbilical hernia containing fat. Electronically Signed   By: Lucienne Capers M.D.   On: 07/02/2021 01:13     Assessment and Plan:   1. Chest Pain with Mixed  Features/Elevated Troponin Values - She reports having palpitations yesterday afternoon while working outside in the heat but developed chest tightness last night while at rest which radiated into her back.  Symptoms lasted for over an hour and resolved with SL NTG in the ED. No recurrent pain overnight or this AM.  - Her Hs Troponin values were elevated at 108 and 96 on admission. She did have 9 beats NSVT on telemetry and will check Mg. K+ at 3.9. She did have a low risk NST in 03/2021.  Reviewed with the patient today that given her elevated enzymes, a cardiac catheterization will likely be recommended for definitive evaluation but she is on Eliquis which would delay her procedure. Her last dose was during the AM hours of 7/7 as she did not take it last night. We discussed possible Heparin bridging with cath on Monday but she strongly wishes to avoid this if able due to a family reunion over the weekend. Will initially obtain an echocardiogram for reassessment of her EF. Will discuss with MD in regards to further evaluation pending echo results as enzyme elevation could possibly be due to an arrhythmia given her palpitations prior to symptom onset. - Continue to hold Eliquis for now and if needing a cardiac catheterization, would start Heparin. If planning for outpatient evaluation, would resume Eliquis. - Continue PTA Atorvastatin 40mg  daily and Toprol-XL 25mg  daily. Will start ASA.   ADDENDUM: Confirmed with the patient her last dose of Eliquis was on 07/01/2021 at 0830. Will keep NPO for now until evaluated by Dr. Harrington Challenger as she might be a candidate for catheterization later today if indicated.   2. CHB - She is s/p St. Jude PPM placement which is followed by Dr. Lovena Le. Most recent interrogation  in 04/2021 showed 3 episodes of atrial arrhythmias. Has been on Eliquis due to history of PE and is on Toprol-XL 25mg  daily.   3. HTN - BP has been variable from 124/50 - 159/69 since admission, at 141/88 on  most recent check. Will order PTA Losartan, Hydralazine and Toprol-XL.   4. HLD - She has been continued on Atorvastatin 40mg  daily.   5. History of PE - Diagnosed in 2017 and she has remained on long-term anticoagulation given her PE was unprovoked. Eliquis currently held as discussed in #1.    For questions or updates, please contact [facility] Please consult [email or web address] for contact info under    Signed, Erma Heritage, PA-C  07/02/2021 10:50 AM  Pt seen and examined   I agree with findings as noted above by B Strader. Pt is a 80 yo who is followed by Beckie Salts   Hx PPM This spring had CP   ED eval negative  Set up for myoview which was done in [facility] and was normal   She presented yesterday after having a spell of CP radiating to back   Lasted about 1 hour   Did not go away until she took about 4 NTG by EMS     EKG is paced   Labs significant for elevation in trop   Peak 108  New   Not at other visits   Improving    Currently the pt denies CP   Breathing is OK at rest   No palpitations   ON exam, Pt comfortable in bed Lungs are CTA Cardiac RRR   No S3  Gr I-II / VI systolic murmur at LSB ABd is supple  Ext are without edema  The pt has had an echo   Overall LVEF is normal   There is septal wall changes consistent with pacing   Distal inferior wall appears hypokinetic, may be slightly more prominent from previous echo   I have reviewed with pt   Given multiple ED visits and now very mildly positive troponin    Given change I would recomm L heart cath to r/o CAD    (CT of chest yesterday showed only mild cacifications)  I think this is important to r/o esp since on anticoagulation       Risks / benefits described   Pt understands and agrees to proceed   Will transfer today   Eliquis is on hold   WIll need to resume ASAP given hx of PE  Dorris Carnes MD

## 2021-07-02 NOTE — Progress Notes (Signed)
Patient admitted to the hospital earlier this morning by Dr. Olevia Bowens  She was admitted to the hospital with chest pain.  Patient seen and examined, is not having any further chest pain at this time.  She has had a mild bump in troponin since admission.  Echocardiogram showed normal EF.  She had a negative stress test in 03/2021.  She was seen by cardiology with recommendations to transfer to [facility] for cardiac catheterization.  She will be transferred under cardiology service.  Hospitalist service will sign off on transfer.  Please let us know if we can be of any further assistance.  [employer]

## 2021-07-02 NOTE — Consult Note (Addendum)
Cardiology Consultation:   Patient ID: [name] MRN: [id number]; DOB: [birth year]  Admit date: 07/01/2021 Date of Consult: 07/02/2021  PCP:  [name], MD   [facility] Providers Cardiologist:  Cristopher Peru, MD        Patient Profile:   Natalie Castro is a 80 y.o. female with a past medical history of CHB (s/p St. Jude PPM placement), HTN, HLD, Type 2 DM and history of PE (diagnosed in 2017 and has remained on long-term anticoagulation) who is being seen today for the evaluation of chest pain at the request of Dr. Roderic Palau.  History of Present Illness:   Natalie Castro was evaluated in the ED in 02/2021 for chest pain and troponin values were negative and her EKG showed no acute ST changes. She did follow-up with Richardson Dopp, PA-C and a stress test was recommended for further evaluation. This was performed on 03/30/2021 and showed no evidence of prior infarct or current ischemia and was overall a low risk study. EF was in the low-normal range of 50%. She did follow-up with Dr. Lovena Le in 04/2021 and denied any recurrent pain at that current time. She did report intermittent palpitations which is felt to be most consistent with atrial tachycardia.  She presented to [facility] ED on 07/01/2021 for evaluation of chest discomfort which radiated into her back with associated nausea. In talking with the patient today, she reports being in her normal state of health until yesterday afternoon when she developed some palpitations while working in her garden. She reports it was hot outside at the time but she had been trying to stay hydrated.  Last night, around the time she was starting to go to bed, she developed a tightness along her pectoral region which radiated into her back. Pain was not associated with exertion or positional changes. Reports associated nausea at that time. Symptoms lasted for over an hour and improved while in the ED after having received SL NTG. Denies any recurrent pain  overnight or this AM. Feels back to baseline and anxious to go home as she has a family reunion tomorrow.   Initial labs show WBC 8.1, Hgb 12.4, platelets 173, Na+ 139, K+ 3.9 and creatinine 0.72. COVID negative. Hgb A1c 7.4. Initial Hs Troponin 108 with repeat of 96. CXR with no active cardiopulmonary disease. CTA showed moderate diffuse aortic calcifications but no evidence of aortic aneurysm or dissection. Was noted to have a dilated pancreatic duct without an identifiable mass and nonemergent MRCP was recommended for further evaluation in the future. EKG shows V-pacing with LBBB and PVC's.   Past Medical History:  Diagnosis Date   Acid reflux    Arthritis    Colon adenomas    AGE 26   Complete heart block (HCC)    STJ PPM Dr. 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, long term anticoag with apixaban    Hyperlipidemia    Myoview 03/2021    Myoview 4/22: EF 50, no infarct or ischemia; low risk   Presence of permanent cardiac pacemaker    Type 2 diabetes mellitus ([place])     Past Surgical History:  Procedure Laterality Date   COLONOSCOPY     3 SIMPLE ADENOMAS, AGE 54   COLONOSCOPY N/A 05/24/2018   Procedure: COLONOSCOPY;  Surgeon: Danie Binder, MD;  Location: [facility];  Service: Endoscopy;  Laterality: N/A;  1:00pm   EP IMPLANTABLE DEVICE N/A 11/24/2015  Procedure: Pacemaker Implant;  Surgeon: [name], MD;  Location: [place] [facility];  Service: Cardiovascular;  Laterality: N/A;   JOINT REPLACEMENT     knees bilat.   POLYPECTOMY  05/24/2018   Procedure: POLYPECTOMY;  Surgeon: Danie Binder, MD;  Location: [facility];  Service: Endoscopy;;  ascending and hepatic flexure, transverse   TUBAL LIGATION      Home Medications:  Prior to Admission medications   Medication Sig Start Date End Date Taking? Authorizing Provider  atorvastatin (LIPITOR) 40 MG tablet TAKE (1) TABLET BY MOUTH AT BEDTIME. Patient taking  differently: Take 40 mg by mouth daily. 05/28/20   Corum, Rex Kras, MD  ELIQUIS 5 MG TABS tablet TAKE 1 TABLET BY MOUTH TWICE A DAY. Patient taking differently: Take 5 mg by mouth 2 (two) times daily. 05/05/20   Corum, Rex Kras, MD  glucose blood test strip Use to check blood glucose fasting , before lunch and dinner and after your largest meal 11/04/19   Corum, Rex Kras, MD  hydrALAZINE (APRESOLINE) 50 MG tablet Take 50 mg by mouth 2 (two) times daily.    [provider]  Lancets Glory Rosebush ULTRASOFT) lancets Use to check glucose 4 x daily 11/04/19   Corum, Rex Kras, MD  LANTUS SOLOSTAR 100 UNIT/ML Solostar Pen Inject 30 Units into the skin at bedtime. 07/06/20   Cassandria Anger, MD  losartan (COZAAR) 100 MG tablet TAKE (1) TABLET BY MOUTH ONCE DAILY. Patient taking differently: Take 100 mg by mouth daily. 05/28/20   Corum, Rex Kras, MD  metFORMIN (GLUCOPHAGE) 1000 MG tablet TAKE (1) TABLET BY MOUTH TWICE DAILY. 05/17/21   Cassandria Anger, MD  metoprolol succinate (TOPROL-XL) 25 MG 24 hr tablet Take 25 mg by mouth daily. 04/03/20   [provider]  NIFEdipine (ADALAT CC) 30 MG 24 hr tablet Take 30 mg by mouth daily. 05/17/21   [provider]  NIFEdipine (PROCARDIA-XL/NIFEDICAL-XL) 30 MG 24 hr tablet TAKE 1 TABLET BY MOUTH ONCE A DAY. Patient taking differently: Take 30 mg by mouth daily. 05/28/20   Corum, Rex Kras, MD  nitroGLYCERIN (NITROSTAT) 0.4 MG SL tablet Place 1 tablet (0.4 mg total) under the tongue every 5 (five) minutes as needed for chest pain. 02/24/21 05/25/21  Liliane Shi, PA-C    Inpatient Medications: Scheduled Meds:  aspirin EC  81 mg Oral Daily   atorvastatin  40 mg Oral Daily   hydrALAZINE  50 mg Oral BID   insulin aspart  0-15 Units Subcutaneous TID WC   losartan  100 mg Oral Daily   metoprolol succinate  25 mg Oral Daily   Continuous Infusions:  PRN Meds: acetaminophen **OR** acetaminophen, morphine injection, nitroGLYCERIN, ondansetron **OR**  ondansetron (ZOFRAN) IV  Allergies:    Allergies  Allergen Reactions   Penicillins Hives, Itching and Other (See Comments)    Has patient had a PCN reaction causing immediate rash, facial/tongue/throat swelling, SOB or lightheadedness with hypotension: No Has patient had a PCN reaction causing severe rash involving mucus membranes or skin necrosis: Yes Has patient had a PCN reaction that required hospitalization No Has patient had a PCN reaction occurring within the last 10 years: No If all of the above answers are "NO", then may proceed with Cephalosporin use.     Social History:   Social History   Socioeconomic History   Marital status: Widowed    Spouse name: Not on file   Number of children: Not on file   Years  of education: Not on file   Highest education level: Not on file  Occupational History   Occupation: retired  Tobacco Use   Smoking status: Never   Smokeless tobacco: Never  Vaping Use   Vaping Use: Never used  Substance and Sexual Activity   Alcohol use: No    Alcohol/week: 0.0 standard drinks   Drug use: No   Sexual activity: Yes    Birth control/protection: Post-menopausal  Other Topics Concern   Not on file  Social History Narrative   MARRIED FOR 73 YRS. 5 KIDS: #4 PRESENT TODAY(AGE 28)   Social Determinants of Health   Financial Resource Strain: Not on file  Food Insecurity: Not on file  Transportation Needs: Not on file  Physical Activity: Not on file  Stress: Not on file  Social Connections: Not on file  Intimate Partner Violence: Not on file    Family History:    Family History  Problem Relation Age of Onset   Heart failure Mother    Hypertension Mother    Heart failure Sister    Hypertension Father    Cancer Sister    Dementia Sister    Colon cancer Neg Hx    Colon polyps Neg Hx      ROS:  Please see the history of present illness.   All other ROS reviewed and negative.     Physical Exam/Data:   Vitals:   07/02/21 0143  07/02/21 0200 07/02/21 0238 07/02/21 0527  BP: (!) 158/70 (!) 151/67 (!) 159/69 (!) 141/88  Pulse: 81 72 79 80  Resp: 15 20 18 18   Temp:  98.5 F (36.9 C) 98.5 F (36.9 C) 97.8 F (36.6 C)  TempSrc:  Oral Oral Oral  SpO2: 100% 97% 99% 100%  Weight:   95.2 kg   Height:   5\' 5"  (1.651 m)    No intake or output data in the 24 hours ending 07/02/21 1050 Last 3 Weights 07/02/2021 07/01/2021 05/20/2021  Weight (lbs) 209 lb 14.1 oz 212 lb 209 lb 6.4 oz  Weight (kg) 95.2 kg 96.163 kg 94.983 kg     Body mass index is 34.93 kg/m.  General:  Well nourished, well developed female appearing in no acute distress. HEENT: normal Lymph: no adenopathy Neck: no JVD Endocrine:  No thryomegaly Vascular: No carotid bruits; FA pulses 2+ bilaterally without bruits  Cardiac:  normal S1, S2; RRR; no murmur  Lungs:  clear to auscultation bilaterally, no wheezing, rhonchi or rales  Abd: soft, nontender, no hepatomegaly  Ext: Trace lower extremity edema.  Musculoskeletal:  No deformities, BUE and BLE strength normal and equal Skin: warm and dry  Neuro:  CNs 2-12 intact, no focal abnormalities noted Psych:  Normal affect   EKG:  The EKG was personally reviewed and demonstrates: V-pacing with PVC's.  Telemetry:  Telemetry was personally reviewed and demonstrates: V-pacing, HR in 70's. One episode of 9 beats NSVT.   Relevant CV Studies:  Echocardiogram: 12/2017 Study Conclusions   - Left ventricle: The cavity size was normal. Wall thickness was    increased in a pattern of moderate LVH. Systolic function was    normal. The estimated ejection fraction was in the range of 50%    to 55%. Although no diagnostic regional wall motion abnormality    was identified, this possibility cannot be completely excluded on    the basis of this study. Doppler parameters are consistent with    abnormal left ventricular relaxation (grade 1 diastolic  dysfunction).  - Aortic valve: Mildly calcified annulus. Trileaflet.   - Mitral valve: Mildly calcified annulus. There was trivial    regurgitation.  - Right ventricle: Pacer wire or catheter noted in right ventricle.  - Right atrium: Pacer wire or catheter noted in right atrium.  - Atrial septum: No defect or patent foramen ovale was identified.  - Tricuspid valve: There was trivial regurgitation.  - Pulmonary arteries: Systolic pressure could not be accurately    estimated.  - Pericardium, extracardiac: A trivial pericardial effusion was    identified posterior to the heart.   NST: 03/2021 There was no ST segment deviation noted during stress. The study is normal. There are no perfusion defects consistent with prior infarct or current ischemia. This is a low risk study. The left ventricular ejection fraction is low normal (50%).  Laboratory Data:  High Sensitivity Troponin:   Recent Labs  Lab 07/01/21 2247 07/02/21 0041  TROPONINIHS 108* 96*     Chemistry Recent Labs  Lab 07/01/21 2247  NA 139  K 3.9  CL 108  CO2 26  GLUCOSE 208*  BUN 13  CREATININE 0.72  CALCIUM 8.6*  GFRNONAA >60  ANIONGAP 5    Recent Labs  Lab 07/01/21 2247  PROT 7.3  ALBUMIN 3.2*  AST 12*  ALT 10  ALKPHOS 73  BILITOT 0.3   Hematology Recent Labs  Lab 07/01/21 2247  WBC 8.1  RBC 4.08  HGB 12.4  HCT 39.7  MCV 97.3  MCH 30.4  MCHC 31.2  RDW 14.7  PLT 173   BNPNo results for input(s): BNP, PROBNP in the last 168 hours.  DDimer No results for input(s): DDIMER in the last 168 hours.   Radiology/Studies:  DG Chest Portable 1 View  Result Date: 07/01/2021 CLINICAL DATA:  Centralized chest pain radiating to the back. Nausea and vomiting. EXAM: PORTABLE CHEST 1 VIEW COMPARISON:  02/23/2021 FINDINGS: Cardiac pacemaker. Borderline heart size with normal pulmonary vascularity. No airspace disease or consolidation in the lungs. No pleural effusions. No pneumothorax. Mediastinal contours appear intact. Calcification of the aorta. IMPRESSION: Borderline  heart size.  No active pulmonary disease. Electronically Signed   By: Lucienne Capers M.D.   On: 07/01/2021 23:23   CT Angio Chest/Abd/Pel for Dissection W and/or Wo Contrast  Result Date: 07/02/2021 CLINICAL DATA:  Centralized chest pain radiating to the back. Nausea. EXAM: CT ANGIOGRAPHY CHEST, ABDOMEN AND PELVIS TECHNIQUE: Non-contrast CT of the chest was initially obtained. Multidetector CT imaging through the chest, abdomen and pelvis was performed using the standard protocol during bolus administration of intravenous contrast. Multiplanar reconstructed images and MIPs were obtained and reviewed to evaluate the vascular anatomy. CONTRAST:  136mL OMNIPAQUE IOHEXOL 350 MG/ML SOLN COMPARISON:  CT chest 11/04/2016 FINDINGS: CTA CHEST FINDINGS Cardiovascular: Noncontrast images of the chest demonstrate scattered calcification in the aorta. Mild coronary artery calcifications. No evidence of intramural hematoma. Images obtained during the arterial phase after contrast injection demonstrates normal caliber thoracic aorta. No aortic dissection. Great vessel origins are patent. No evidence of pulmonary embolus. Normal heart size. No pericardial effusion. Cardiac pacemaker. Mediastinum/Nodes: No enlarged mediastinal, hilar, or axillary lymph nodes. Thyroid gland, trachea, and esophagus demonstrate no significant findings. Lungs/Pleura: Mild atelectasis in the lung bases. Lungs are otherwise clear. No pleural effusions. No pneumothorax. Airways are patent. Musculoskeletal: Degenerative changes in the spine. No destructive bone lesions. Review of the MIP images confirms the above findings. CTA ABDOMEN AND PELVIS FINDINGS VASCULAR Aorta: Diffuse aortic calcification. No aneurysm  or dissection. No critical stenosis. Celiac: The celiac axis is patent although there is evidence of focal stenosis at the origin. SMA: Patent without evidence of aneurysm, dissection, vasculitis or significant stenosis. Renals: Both renal  arteries are patent without evidence of aneurysm, dissection, vasculitis, fibromuscular dysplasia or significant stenosis. IMA: Patent without evidence of aneurysm, dissection, vasculitis or significant stenosis. Inflow: Patent without evidence of aneurysm, dissection, vasculitis or significant stenosis. Veins: No obvious venous abnormality within the limitations of this arterial phase study. Review of the MIP images confirms the above findings. NON-VASCULAR Hepatobiliary: Several low-attenuation lesions in the liver most likely representing cysts. Gallbladder is mildly distended without stone or wall thickening. No bile duct dilatation. Pancreas: Mildly dilated pancreatic duct. No discrete mass is identified. Consider nonemergent MRCP for follow-up. Spleen: Normal in size without focal abnormality. Adrenals/Urinary Tract: Adrenal glands are unremarkable. Kidneys are normal, without renal calculi, focal lesion, or hydronephrosis. Bladder is unremarkable. Stomach/Bowel: Stomach is within normal limits. Appendix appears normal. No evidence of bowel wall thickening, distention, or inflammatory changes. Lymphatic: No significant lymphadenopathy. Reproductive: Uterus and ovaries are not enlarged. Other: No free air or free fluid in the abdomen. Small periumbilical hernia containing fat. Midline abdominal wall scarring is likely postoperative. Musculoskeletal: Degenerative changes in the spine. No destructive bone lesions. Review of the MIP images confirms the above findings. IMPRESSION: 1. Moderate diffuse aortic calcifications/atherosclerosis. No evidence of aortic aneurysm or dissection. 2. No evidence of active pulmonary disease. 3. Dilated pancreatic duct without discrete mass identified. Consider nonemergent MRCP for follow-up. 4. Small periumbilical hernia containing fat. Electronically Signed   By: Lucienne Capers M.D.   On: 07/02/2021 01:13     Assessment and Plan:   1. Chest Pain with Mixed  Features/Elevated Troponin Values - She reports having palpitations yesterday afternoon while working outside in the heat but developed chest tightness last night while at rest which radiated into her back.  Symptoms lasted for over an hour and resolved with SL NTG in the ED. No recurrent pain overnight or this AM.  - Her Hs Troponin values were elevated at 108 and 96 on admission. She did have 9 beats NSVT on telemetry and will check Mg. K+ at 3.9. She did have a low risk NST in 03/2021.  Reviewed with the patient today that given her elevated enzymes, a cardiac catheterization will likely be recommended for definitive evaluation but she is on Eliquis which would delay her procedure. Her last dose was during the AM hours of 7/7 as she did not take it last night. We discussed possible Heparin bridging with cath on Monday but she strongly wishes to avoid this if able due to a family reunion over the weekend. Will initially obtain an echocardiogram for reassessment of her EF. Will discuss with MD in regards to further evaluation pending echo results as enzyme elevation could possibly be due to an arrhythmia given her palpitations prior to symptom onset. - Continue to hold Eliquis for now and if needing a cardiac catheterization, would start Heparin. If planning for outpatient evaluation, would resume Eliquis. - Continue PTA Atorvastatin 40mg  daily and Toprol-XL 25mg  daily. Will start ASA.   ADDENDUM: Confirmed with the patient her last dose of Eliquis was on 07/01/2021 at 0830. Will keep NPO for now until evaluated by Dr. Harrington Challenger as she might be a candidate for catheterization later today if indicated.   2. CHB - She is s/p St. Jude PPM placement which is followed by Dr. Lovena Le. Most recent interrogation  in 04/2021 showed 3 episodes of atrial arrhythmias. Has been on Eliquis due to history of PE and is on Toprol-XL 25mg  daily.   3. HTN - BP has been variable from 124/50 - 159/69 since admission, at 141/88 on  most recent check. Will order PTA Losartan, Hydralazine and Toprol-XL.   4. HLD - She has been continued on Atorvastatin 40mg  daily.   5. History of PE - Diagnosed in 2017 and she has remained on long-term anticoagulation given her PE was unprovoked. Eliquis currently held as discussed in #1.    For questions or updates, please contact [place] Please consult [email or web address] for contact info under    Signed, Erma Heritage, PA-C  07/02/2021 10:50 AM  Pt seen and examined   I agree with findings as noted above by B Strader. Pt is a 80 yo who is followed by Beckie Salts   Hx PPM This spring had CP   ED eval negative  Set up for myoview which was done in [facility] and was normal   She presented yesterday after having a spell of CP radiating to back   Lasted about 1 hour   Did not go away until she took about 4 NTG by EMS     EKG is paced   Labs significant for elevation in trop   Peak 108  New   Not at other visits   Improving    Currently the pt denies CP   Breathing is OK at rest   No palpitations   ON exam, Pt comfortable in bed Lungs are CTA Cardiac RRR   No S3  Gr I-II / VI systolic murmur at LSB ABd is supple  Ext are without edema  The pt has had an echo   Overall LVEF is normal   There is septal wall changes consistent with pacing   Distal inferior wall appears hypokinetic, may be slightly more prominent from previous echo   I have reviewed with pt   Given multiple ED visits and now very mildly positive troponin    Given change I would recomm L heart cath to r/o CAD    (CT of chest yesterday showed only mild cacifications)  I think this is important to r/o esp since on anticoagulation       Risks / benefits described   Pt understands and agrees to proceed   Will transfer today   Eliquis is on hold   WIll need to resume ASAP given hx of PE  Dorris Carnes MD

## 2021-07-02 NOTE — Plan of Care (Signed)
  Problem: Education: Goal: Knowledge of General Education information will improve Description Including pain rating scale, medication(s)/side effects and non-pharmacologic comfort measures Outcome: Progressing   

## 2021-07-02 NOTE — Progress Notes (Signed)
Received from [employer]. Patient alert and oriented X4, skin warm and dry resp even and unlabored.  Pt denies any chest pain at this time.  IVF infusing in the left arm, consent signed and pt put on monitor.  Dr Irish Lack in to talk to patient.  Pt waiting for cath procedure.

## 2021-07-02 NOTE — ED Notes (Signed)
Pt's daughter at bedside, awaiting ct scan

## 2021-07-02 NOTE — ED Notes (Signed)
Pt returned from CT scan. Update provided to daughter and pt from this RN and Dr. Sedonia Small. Awaiting CT results. Pt on cardiac and pulse ox monitoring. Pt denies further needs at this time. Denies nausea. Side rails up x 2, bed locked and low, call bell within reach. Will continue to monitor.

## 2021-07-02 NOTE — ED Notes (Signed)
Patient transported to CT 

## 2021-07-02 NOTE — Progress Notes (Signed)
  Echocardiogram 2D Echocardiogram has been performed.  Elpidio Anis 07/02/2021, 12:40 PM

## 2021-07-02 NOTE — H&P (Signed)
History and Physical    Natalie Castro WCB:[id number] DOB: May 04, 1941 DOA: 07/01/2021  PCP: [name], MD  Patient coming from: Home.  I have personally briefly reviewed patient's old medical records in [place]  Chief Complaint: Chest pain.  HPI: Natalie Castro is a 80 y.o. female with medical history significant of acid reflux, osteoarthritis, colon adenomas, history of complete heart block with pacemaker placement, hypertension, history of PE, hyperlipidemia, type II DM who is coming to the emergency department due to nonirradiated, pressure-like precordial chest pain associated with dyspnea, palpitations, diaphoresis, nausea and one episode of emesis.  She stated that her symptoms began just as she was getting ready to go to sleep and lie down in bed.  She denied recent lower extremity edema, PND or dyspnea on exertion.  No abdominal pain, diarrhea, constipation, melena or hematochezia.  No dysuria, flank pain, frequency or hematuria.  Denies polyuria, polydipsia, polyphagia or blurred vision.  ED Course: Initial vital signs were temperature 97.5 F, pulse 99, respirations 24, BP 142/98 mmHg and O2 sat 100% on room air.  The patient received sublingual nitroglycerin and 0.5 mg of lorazepam IVP.  Lab work: CBC showed a white count of 8.1, hemoglobin 12.4 g/dL platelets 173.  BMP showed a glucose of 208 mg/dL.  The rest of the values are normal when calcium is corrected to an albumin of 3.2 g/dL.  LFTs are unremarkable except for mild hypoalbuminemia.  Troponin was 108 and then 96 ng/L.  Lipase was 37 units/L.  Imaging: One-view portable chest radiograph showed borderline heart size, but no other acute abnormality.  CTA chest/abdomen/pelvis showed moderate diffuse aortic calcification/atherosclerosis, but no evidence of PE, aortic aneurysm or dissection.  Please see images and full detailed radiology reports for further information.  Review of Systems: As per HPI otherwise all  other systems reviewed and are negative.  Past Medical History:  Diagnosis Date   Acid reflux    Arthritis    Colon adenomas    AGE 67   Complete heart block (HCC)    STJ PPM Dr. 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, long term anticoag with apixaban    Hyperlipidemia    Myoview 03/2021    Myoview 4/22: EF 50, no infarct or ischemia; low risk   Presence of permanent cardiac pacemaker    Type 2 diabetes mellitus ([place])    Past Surgical History:  Procedure Laterality Date   COLONOSCOPY     3 SIMPLE ADENOMAS, AGE 89   COLONOSCOPY N/A 05/24/2018   Procedure: COLONOSCOPY;  Surgeon: Danie Binder, MD;  Location[facility];  Service: Endoscopy;  Laterality: N/A;  1:00pm   EP IMPLANTABLE DEVICE N/A 11/24/2015   Procedure: Pacemaker Implant;  Surgeon: [name], MD;  Location: [facility];  Service: Cardiovascular;  Laterality: N/A;   JOINT REPLACEMENT     knees bilat.   POLYPECTOMY  05/24/2018   Procedure: POLYPECTOMY;  Surgeon: Danie Binder, MD;  Location: [facility];  Service: Endoscopy;;  ascending and hepatic flexure, transverse   TUBAL LIGATION     Social History  reports that she has never smoked. She has never used smokeless tobacco. She reports that she does not drink alcohol and does not use drugs.  Allergies  Allergen Reactions   Penicillins Hives, Itching and Other (See Comments)    Has patient had a PCN reaction causing immediate rash, facial/tongue/throat swelling, SOB  or lightheadedness with hypotension: No Has patient had a PCN reaction causing severe rash involving mucus membranes or skin necrosis: Yes Has patient had a PCN reaction that required hospitalization No Has patient had a PCN reaction occurring within the last 10 years: No If all of the above answers are "NO", then may proceed with Cephalosporin use.    Family History  Problem Relation Age of Onset   Heart failure Mother     Hypertension Mother    Heart failure Sister    Hypertension Father    Cancer Sister    Dementia Sister    Colon cancer Neg Hx    Colon polyps Neg Hx    Prior to Admission medications   Medication Sig Start Date End Date Taking? Authorizing Provider  atorvastatin (LIPITOR) 40 MG tablet TAKE (1) TABLET BY MOUTH AT BEDTIME. Patient taking differently: Take 40 mg by mouth daily. 05/28/20   Corum, Rex Kras, MD  ELIQUIS 5 MG TABS tablet TAKE 1 TABLET BY MOUTH TWICE A DAY. Patient taking differently: Take 5 mg by mouth 2 (two) times daily. 05/05/20   Corum, Rex Kras, MD  glucose blood test strip Use to check blood glucose fasting , before lunch and dinner and after your largest meal 11/04/19   Corum, Rex Kras, MD  hydrALAZINE (APRESOLINE) 50 MG tablet Take 50 mg by mouth 2 (two) times daily.    [provider]  Lancets Glory Rosebush ULTRASOFT) lancets Use to check glucose 4 x daily 11/04/19   Corum, Rex Kras, MD  LANTUS SOLOSTAR 100 UNIT/ML Solostar Pen Inject 30 Units into the skin at bedtime. 07/06/20   Cassandria Anger, MD  losartan (COZAAR) 100 MG tablet TAKE (1) TABLET BY MOUTH ONCE DAILY. Patient taking differently: Take 100 mg by mouth daily. 05/28/20   Corum, Rex Kras, MD  metFORMIN (GLUCOPHAGE) 1000 MG tablet TAKE (1) TABLET BY MOUTH TWICE DAILY. 05/17/21   Cassandria Anger, MD  metoprolol succinate (TOPROL-XL) 25 MG 24 hr tablet Take 25 mg by mouth daily. 04/03/20   [provider]  NIFEdipine (ADALAT CC) 30 MG 24 hr tablet Take 30 mg by mouth daily. 05/17/21   [provider]  NIFEdipine (PROCARDIA-XL/NIFEDICAL-XL) 30 MG 24 hr tablet TAKE 1 TABLET BY MOUTH ONCE A DAY. Patient taking differently: Take 30 mg by mouth daily. 05/28/20   Corum, Rex Kras, MD  nitroGLYCERIN (NITROSTAT) 0.4 MG SL tablet Place 1 tablet (0.4 mg total) under the tongue every 5 (five) minutes as needed for chest pain. 02/24/21 05/25/21  Richardson Dopp T, PA-C   Physical Exam: Vitals:   07/02/21 0000 07/02/21  0030 07/02/21 0143 07/02/21 0200  BP: 133/63 (!) 124/50 (!) 158/70 (!) 151/67  Pulse: 80 77 81 72  Resp: 19 (!) 21 15 20   Temp:    98.5 F (36.9 C)  TempSrc:    Oral  SpO2: 94% 99% 100% 97%  Weight:      Height:       Constitutional: NAD, calm, comfortable Eyes: PERRL, lids and conjunctivae normal ENMT: Mucous membranes are moist. Posterior pharynx clear of any exudate or lesions. Neck: normal, supple, no masses, no thyromegaly Respiratory: Clear to auscultation bilaterally, no wheezing, no crackles. Normal respiratory effort. No accessory muscle use.  Cardiovascular: Regular rate and rhythm, no murmurs / rubs / gallops. No extremity edema. 2+ pedal pulses. No carotid bruits.  Abdomen: Obese, no distention.  Bowel sounds positive.  Soft, no tenderness, no masses palpated. No hepatosplenomegaly. Musculoskeletal: no  clubbing / cyanosis. Good ROM, no contractures. Normal muscle tone.  Skin: no acute rashes, lesions, ulcers on very limited dermatological examination. Neurologic: CN 2-12 grossly intact. Sensation intact, DTR normal. Strength 5/5 in all 4.  Psychiatric: Normal judgment and insight. Alert and oriented x 3. Normal mood.   Labs on Admission: I have personally reviewed following labs and imaging studies  CBC: Recent Labs  Lab 07/01/21 2247  WBC 8.1  HGB 12.4  HCT 39.7  MCV 97.3  PLT 696    Basic Metabolic Panel: Recent Labs  Lab 07/01/21 2247  NA 139  K 3.9  CL 108  CO2 26  GLUCOSE 208*  BUN 13  CREATININE 0.72  CALCIUM 8.6*    GFR: Estimated Creatinine Clearance: 61.7 mL/min (by C-G formula based on SCr of 0.72 mg/dL).  Liver Function Tests: Recent Labs  Lab 07/01/21 2247  AST 12*  ALT 10  ALKPHOS 73  BILITOT 0.3  PROT 7.3  ALBUMIN 3.2*   Radiological Exams on Admission: DG Chest Portable 1 View  Result Date: 07/01/2021 CLINICAL DATA:  Centralized chest pain radiating to the back. Nausea and vomiting. EXAM: PORTABLE CHEST 1 VIEW COMPARISON:   02/23/2021 FINDINGS: Cardiac pacemaker. Borderline heart size with normal pulmonary vascularity. No airspace disease or consolidation in the lungs. No pleural effusions. No pneumothorax. Mediastinal contours appear intact. Calcification of the aorta. IMPRESSION: Borderline heart size.  No active pulmonary disease. Electronically Signed   By: Lucienne Capers M.D.   On: 07/01/2021 23:23   CT Angio Chest/Abd/Pel for Dissection W and/or Wo Contrast  Result Date: 07/02/2021 CLINICAL DATA:  Centralized chest pain radiating to the back. Nausea. EXAM: CT ANGIOGRAPHY CHEST, ABDOMEN AND PELVIS TECHNIQUE: Non-contrast CT of the chest was initially obtained. Multidetector CT imaging through the chest, abdomen and pelvis was performed using the standard protocol during bolus administration of intravenous contrast. Multiplanar reconstructed images and MIPs were obtained and reviewed to evaluate the vascular anatomy. CONTRAST:  1102mL OMNIPAQUE IOHEXOL 350 MG/ML SOLN COMPARISON:  CT chest 11/04/2016 FINDINGS: CTA CHEST FINDINGS Cardiovascular: Noncontrast images of the chest demonstrate scattered calcification in the aorta. Mild coronary artery calcifications. No evidence of intramural hematoma. Images obtained during the arterial phase after contrast injection demonstrates normal caliber thoracic aorta. No aortic dissection. Great vessel origins are patent. No evidence of pulmonary embolus. Normal heart size. No pericardial effusion. Cardiac pacemaker. Mediastinum/Nodes: No enlarged mediastinal, hilar, or axillary lymph nodes. Thyroid gland, trachea, and esophagus demonstrate no significant findings. Lungs/Pleura: Mild atelectasis in the lung bases. Lungs are otherwise clear. No pleural effusions. No pneumothorax. Airways are patent. Musculoskeletal: Degenerative changes in the spine. No destructive bone lesions. Review of the MIP images confirms the above findings. CTA ABDOMEN AND PELVIS FINDINGS VASCULAR Aorta: Diffuse  aortic calcification. No aneurysm or dissection. No critical stenosis. Celiac: The celiac axis is patent although there is evidence of focal stenosis at the origin. SMA: Patent without evidence of aneurysm, dissection, vasculitis or significant stenosis. Renals: Both renal arteries are patent without evidence of aneurysm, dissection, vasculitis, fibromuscular dysplasia or significant stenosis. IMA: Patent without evidence of aneurysm, dissection, vasculitis or significant stenosis. Inflow: Patent without evidence of aneurysm, dissection, vasculitis or significant stenosis. Veins: No obvious venous abnormality within the limitations of this arterial phase study. Review of the MIP images confirms the above findings. NON-VASCULAR Hepatobiliary: Several low-attenuation lesions in the liver most likely representing cysts. Gallbladder is mildly distended without stone or wall thickening. No bile duct dilatation. Pancreas: Mildly dilated pancreatic  duct. No discrete mass is identified. Consider nonemergent MRCP for follow-up. Spleen: Normal in size without focal abnormality. Adrenals/Urinary Tract: Adrenal glands are unremarkable. Kidneys are normal, without renal calculi, focal lesion, or hydronephrosis. Bladder is unremarkable. Stomach/Bowel: Stomach is within normal limits. Appendix appears normal. No evidence of bowel wall thickening, distention, or inflammatory changes. Lymphatic: No significant lymphadenopathy. Reproductive: Uterus and ovaries are not enlarged. Other: No free air or free fluid in the abdomen. Small periumbilical hernia containing fat. Midline abdominal wall scarring is likely postoperative. Musculoskeletal: Degenerative changes in the spine. No destructive bone lesions. Review of the MIP images confirms the above findings. IMPRESSION: 1. Moderate diffuse aortic calcifications/atherosclerosis. No evidence of aortic aneurysm or dissection. 2. No evidence of active pulmonary disease. 3. Dilated  pancreatic duct without discrete mass identified. Consider nonemergent MRCP for follow-up. 4. Small periumbilical hernia containing fat. Electronically Signed   By: Lucienne Capers M.D.   On: 07/02/2021 01:13    03/30/2021 NM Myocar Multi W/Spect W/Wall Motion / EF  There was no ST segment deviation noted during stress. The study is normal. There are no perfusion defects consistent with prior infarct or current ischemia. This is a low risk study. The left ventricular ejection fraction is low normal (50%).  10/26/2018 echocardiogram complete -------------------------------------------------------------------  LV EF: 50% -   55%   -------------------------------------------------------------------  Indications:      Ventricular tachycardia 427.1&quot;,.   -------------------------------------------------------------------  History:   PMH:  Saddle Pulmonary Embolus, Elevated Troponin  Complete Heart Block.  Risk factors:  Hypertension. Diabetes  mellitus.   -------------------------------------------------------------------  Study Conclusions   - Left ventricle: The cavity size was normal. Wall thickness was    increased in a pattern of moderate LVH. Systolic function was    normal. The estimated ejection fraction was in the range of 50%    to 55%. Although no diagnostic regional wall motion abnormality    was identified, this possibility cannot be completely excluded on    the basis of this study. Doppler parameters are consistent with    abnormal left ventricular relaxation (grade 1 diastolic    dysfunction).  - Aortic valve: Mildly calcified annulus. Trileaflet.  - Mitral valve: Mildly calcified annulus. There was trivial    regurgitation.  - Right ventricle: Pacer wire or catheter noted in right ventricle.  - Right atrium: Pacer wire or catheter noted in right atrium.  - Atrial septum: No defect or patent foramen ovale was identified.  - Tricuspid valve: There was trivial  regurgitation.  - Pulmonary arteries: Systolic pressure could not be accurately    estimated.  - Pericardium, extracardiac: A trivial pericardial effusion was    identified posterior to the heart.   EKG: Independently reviewed.  Vent. rate 93 BPM PR interval 189 ms QRS duration 159 ms QT/QTcB 439/547 ms P-R-T axes 161 -84 94 Sinus or ectopic atrial rhythm Multiple premature complexes, vent & supraven Sinus pause Nonspecific IVCD with LAD Left ventricular hypertrophy  Assessment/Plan Principal Problem:   Acute coronary syndrome (HCC)   Elevated troponin I level Observation/telemetry. Supplemental oxygen as needed. Sublingual nitroglycerin and/or morphine IV for pain. Pending med reconciliation will: Continue apixaban 5 mg p.o. daily. Continue metoprolol ER 25 mg p.o. daily. Continue nifedipine XL 30 mg p.o. daily. Consult cardiology in AM.  Active Problems:   Essential hypertension, benign Pending med reconciliation: Will continue losartan, metoprolol and nifedipine. Monitor BP, HR, renal function electrolytes.    Saddle pulmonary embolus (HCC) Continue apixaban 5 mg p.o. twice  daily.    Uncontrolled type 2 diabetes mellitus with hyperglycemia (HCC) Carbohydrate modified diet. Continue Lantus pending med reconciliation. CBG monitoring with RI SS.    Protein-calorie malnutrition, mild (HCC) Protein supplementation twice daily. Consider nutritional services evaluation.    DVT prophylaxis: On Eliquis. Code Status:   Full code. Family Communication:   Disposition Plan:   Patient is from:  Home.  Anticipated DC to:  Home.  Anticipated DC date:  07/02/2021 or 07/03/2021.  Anticipated DC barriers: Clinical status.  Consults called:  Routine a.m. cardiology consult. Admission status:  Observation/telemetry.   Severity of Illness: High severity after presenting with symptoms typical of ACS and elevation of the troponin level.  The patient will need to remain for  close cardiac monitoring and cardiology evaluation later this morning.  Reubin Milan MD [facility]  How to contact the [facility] Attending or Consulting provider [place] or covering provider during after hours [place], for this patient?   Check the care team in [facility] and look for a) attending/consulting [facility] provider listed and b) the [facility] team listed Log into [email or web address] and use [place]'s universal password to access. If you do not have the password, please contact the hospital operator. Locate the [facility] provider you are looking for under [facility] and page to a number that you can be directly reached. If you still have difficulty reaching the provider, please page the [facility] (Director on Call) for the Hospitalists listed on amion for assistance.  07/02/2021, 2:36 AM   This document was prepared using Dragon voice recognition software and may contain some unintended transcription errors.

## 2021-07-03 DIAGNOSIS — I249 Acute ischemic heart disease, unspecified: Secondary | ICD-10-CM | POA: Diagnosis not present

## 2021-07-03 LAB — GLUCOSE, CAPILLARY
Glucose-Capillary: 148 mg/dL — ABNORMAL HIGH (ref 70–99)
Glucose-Capillary: 139 mg/dL — ABNORMAL HIGH (ref 70–99)
Glucose-Capillary: 272 mg/dL — ABNORMAL HIGH (ref 70–99)
Glucose-Capillary: 232 mg/dL — ABNORMAL HIGH (ref 70–99)

## 2021-07-03 MED ORDER — HYDRALAZINE HCL 25 MG PO TABS
25.0000 mg | ORAL_TABLET | Freq: Two times a day (BID) | ORAL | Status: DC
  Administered 2021-07-03 – 2021-07-04 (×2): 25 mg via ORAL
  Filled 2021-07-03 (×2): qty 1

## 2021-07-03 MED ORDER — METOPROLOL SUCCINATE ER 25 MG PO TB24: 25.0000 mg | ORAL_TABLET | Freq: Every day | ORAL | 2 refills | Status: DC

## 2021-07-03 MED ORDER — HYDRALAZINE HCL 25 MG PO TABS: 25.0000 mg | ORAL_TABLET | Freq: Two times a day (BID) | ORAL | 2 refills | Status: DC

## 2021-07-03 MED ORDER — PROCHLORPERAZINE EDISYLATE 10 MG/2ML IJ SOLN
10.0000 mg | Freq: Four times a day (QID) | INTRAMUSCULAR | Status: DC | PRN
  Administered 2021-07-03: 10 mg via INTRAVENOUS
  Filled 2021-07-03: qty 2

## 2021-07-03 NOTE — Discharge Summary (Signed)
Discharge Summary    Patient ID: Natalie Castro MRN: [id number]; DOB: 1941/11/08  Admit date: 07/01/2021 Discharge date: 07/03/2021  PCP:  [name], MD   [facility] Providers Cardiologist:  Cristopher Peru, MD   {  Discharge Diagnoses    Principal Problem:   Acute coronary syndrome [facility]) Active Problems:   Essential hypertension, benign   Saddle pulmonary embolus (HCC)   Elevated troponin I level   Uncontrolled type 2 diabetes mellitus with hyperglycemia (HCC)   Protein-calorie malnutrition, mild (HCC)   Chest pain  Diagnostic Studies/Procedures    LHC 07/02/21:  The left ventricular systolic function is normal. LV end diastolic pressure is mildly elevated. LVEDP 17 mm Hg. The left ventricular ejection fraction is 55-65% by visual estimate. There is no aortic valve stenosis. Mild nonobstructive coroanry artery disease. Tortuous vessels. Catheter induced spasm of the ostial RCA.   Mild radial vasospasm.  Continue preventive therapy.  Low level troponin elevation may be related to her rhythm disturbance noted on ECG.   _____________  Echocardiogram 07/02/21:   1. Hypokinesis of the distal inferior/inferoseptal walls. Changes appear  more prominent than in previous echo in 2017. Marland Kitchen Left ventricular ejection  fraction, by estimation, is 55 to 60%. The left ventricle has normal  function. There is moderate left  ventricular hypertrophy. Elevated left atrial pressure.   2. Right ventricular systolic function is normal. The right ventricular  size is normal. There is normal pulmonary artery systolic pressure.   3. The mitral valve is normal in structure. Mild to moderate mitral valve  regurgitation.   4. The aortic valve is tricuspid. Aortic valve regurgitation is not  visualized. Mild to moderate aortic valve sclerosis/calcification is  present, without any evidence of aortic stenosis.    History of Present Illness     Natalie Castro is a 80 y.o. female with a  past medical history of CHB (s/p St. 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 Roderic Palau.  Ms. Ferraiolo was evaluated in the ED in 02/2021 for chest pain and troponin values were negative and her EKG showed no acute ST changes. She did follow-up with Richardson Dopp, PA-C and a stress test was recommended for further evaluation. This was performed on 03/30/2021 and showed no evidence of prior infarct or current ischemia and was overall a low risk study. EF was in the low-normal range of 50%. She did follow-up with Dr. Lovena Le in 04/2021 and denied any recurrent pain at that current time. She did report intermittent palpitations which is felt to be most consistent with atrial tachycardia.   She presented to [facility] ED on 07/01/2021 for evaluation of chest discomfort which radiated into her back with associated nausea. She reported being in her normal state of health until one day prior to admission when she developed some palpitations while working in her garden. She stated it was hot outside at the time but she had been trying to stay hydrated. That evening she developed a tightness along her pectoral region which radiated into her back. Pain was not associated with exertion or positional changes. Reports associated nausea at that time. Symptoms lasted for over an hour and improved while in the ED after having received SL NTG. Denies any recurrent pain overnight or this AM. Feels back to baseline and anxious to go home as she  has a family reunion tomorrow.   Initial labs show WBC 8.1, Hgb 12.4, platelets 173, Na+ 139, K+ 3.9 and creatinine 0.72. COVID negative. Hgb A1c 7.4. Initial Hs Troponin 108 with repeat of 96. CXR with no active cardiopulmonary disease. CTA showed moderate diffuse aortic calcifications but no evidence of aortic aneurysm or dissection. Was noted to  have a dilated pancreatic duct without an identifiable mass and nonemergent MRCP was recommended for further evaluation in the future. EKG shows V-pacing with LBBB and PVC's.   Given multiple ED admissions and mildly positive HsT plan was for transfer to [facility] for further evaluation with LHC.   Hospital Course     LHC performed 07/02/21 which showed mild non-obstructive CAD. Plan was to continue with preventative therapy. Echocardiogram with hypokinesis of the distal inferior/inferoseptal walls. LVEF at 55 to 60%.with moderate left  ventricular hypertrophy. Mild to moderate mitral valve regurgitation and mild to moderate aortic valve sclerosis/calcification is present, without any evidence of aortic stenosis.   Non cardiac chest pain: -LHC as above with non obstructive CAD -Continue PTA Atorvastatin 40mg  daily and Toprol-XL 25mg  daily.  -Initially started on ASA however with no CAD this was discontinued due to Eliquis.    CHB -s/p St. Jude PPM placement which is followed by Dr. Lovena Le. Most recent interrogation in 04/2021 showed 3 episodes of atrial arrhythmias. Has been on Eliquis due to history of PE and is on Toprol-XL 25mg  daily.  -Needs PPM reprogramming in the OP setting per Dr. [name]    HTN -Variable, therefore hydralazine was decreased to 25mg  BID -Continue PTA Losartan, Toprol-XL.   HLD -Continue Atorvastatin 40mg  daily.   History of PE -Diagnosed in 2017 and she has remained on long-term anticoagulation given her PE was unprovoked. Eliquis restarted today.    Consultants: None    The patient was seen and examined by Dr. [name] who feels that she is stable and ready for discharge today, 07/03/21.   Did the patient have an acute coronary syndrome (MI, NSTEMI, STEMI, etc) this admission?:  No                               Did the patient have a percutaneous coronary intervention (stent / angioplasty)?:  No.   ____________  Discharge Vitals Blood pressure 139/66, pulse 85,  temperature 99.6 F (37.6 C), temperature source Oral, resp. rate 18, height 5\' 5"  (1.651 m), weight 95.2 kg, SpO2 98 %.  Filed Weights   07/01/21 2245 07/02/21 0238  Weight: 96.2 kg 95.2 kg    Labs & Radiologic Studies    CBC Recent Labs    07/01/21 2247  WBC 8.1  HGB 12.4  HCT 39.7  MCV 97.3  PLT 903   Basic Metabolic Panel Recent Labs    07/01/21 2247 07/02/21 1102  NA 139  --   K 3.9  --   CL 108  --   CO2 26  --   GLUCOSE 208*  --   BUN 13  --   CREATININE 0.72  --   CALCIUM 8.6*  --   MG  --  1.6*   Liver Function Tests Recent Labs    07/01/21 2247  AST 12*  ALT 10  ALKPHOS 73  BILITOT 0.3  PROT 7.3  ALBUMIN 3.2*   Recent Labs    07/01/21 2247  LIPASE 37   High Sensitivity Troponin:   Recent Labs  Lab 07/01/21 2247  07/02/21 0041  TROPONINIHS 108* 96*    BNP Invalid input(s): POCBNP D-Dimer No results for input(s): DDIMER in the last 72 hours. Hemoglobin A1C Recent Labs    07/02/21 0155  HGBA1C 7.4*   Fasting Lipid Panel No results for input(s): CHOL, HDL, LDLCALC, TRIG, CHOLHDL, LDLDIRECT in the last 72 hours. Thyroid Function Tests No results for input(s): TSH, T4TOTAL, T3FREE, THYROIDAB in the last 72 hours.  Invalid input(s): FREET3 _____________  CARDIAC CATHETERIZATION  Addendum Date: 07/02/2021    The left ventricular systolic function is normal.  LV end diastolic pressure is mildly elevated. LVEDP 17 mm Hg.  The left ventricular ejection fraction is 55-65% by visual estimate.  There is no aortic valve stenosis.  Mild nonobstructive coroanry artery disease.  Tortuous vessels. Catheter induced spasm of the ostial RCA.  Mild radial vasospasm.  Continue preventive therapy.  Low level troponin elevation may be related to her rhythm disturbance noted on ECG.    Addendum Date: 07/02/2021    The left ventricular systolic function is normal.  LV end diastolic pressure is mildly elevated. LVEDP 17 mm Hg.  The left ventricular  ejection fraction is 55-65% by visual estimate.  There is no aortic valve stenosis.  Mild nonobstructive coroanry artery disease.  Tortuous vessels. Catheter induced spasm of the ostial RCA.  Mild radial vasospasm.  Continue preventive therapy.   Result Date: 07/02/2021  The left ventricular systolic function is normal.  LV end diastolic pressure is mildly elevated. LVEDP 17 mm Hg.  The left ventricular ejection fraction is 55-65% by visual estimate.  There is no aortic valve stenosis.  Mild nonobstructive coroanry artery disease.  Tortuous vessels. Catheter induced spasm of the ostial RCA.  Mild radial vasospasm.  Continue preventive therapy.   DG Chest Portable 1 View  Result Date: 07/01/2021 CLINICAL DATA:  Centralized chest pain radiating to the back. Nausea and vomiting. EXAM: PORTABLE CHEST 1 VIEW COMPARISON:  02/23/2021 FINDINGS: Cardiac pacemaker. Borderline heart size with normal pulmonary vascularity. No airspace disease or consolidation in the lungs. No pleural effusions. No pneumothorax. Mediastinal contours appear intact. Calcification of the aorta. IMPRESSION: Borderline heart size.  No active pulmonary disease. Electronically Signed   By: Lucienne Capers M.D.   On: 07/01/2021 23:23   ECHOCARDIOGRAM COMPLETE  Result Date: 07/02/2021    ECHOCARDIOGRAM REPORT   Patient Name:   DOMENICA WEIGHTMAN Date of Exam: 07/02/2021 Medical Rec #:  [id number]    Height:       65.0 in Accession #:    [id number]   Weight:       209.9 lb Date of Birth:  [birth year]    BSA:          2.019 m Patient Age:    80 years     BP:           141/88 mmHg Patient Gender: F            HR:           80 bpm. Exam Location:  [facility] Procedure: 2D Echo, Cardiac Doppler and Color Doppler Indications:    Chest Pain  History:        Patient has prior history of Echocardiogram examinations, most                 recent 10/26/2018. Pacemaker; Risk Factors:Hypertension, Diabetes                 and Dyslipidemia.  Sonographer:  Wenda Low Referring Phys: [id number] [place]  1. Hypokinesis of the distal inferior/inferoseptal walls. Changes appear more prominent than in previous echo in 2017. Marland Kitchen Left ventricular ejection fraction, by estimation, is 55 to 60%. The left ventricle has normal function. 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 Elevated left atrial pressure.  2. Right ventricular systolic function is normal. The right ventricular size is normal. There is normal pulmonary artery systolic pressure.  3. The mitral valve is normal in structure. Mild to moderate mitral valve regurgitation.  4. The aortic valve is tricuspid. Aortic valve regurgitation is not visualized. Mild to moderate aortic valve sclerosis/calcification is present, without any evidence of aortic stenosis. FINDINGS  Left Ventricle: Hypokinesis of the distal inferior/inferoseptal walls. Changes appear more prominent than in previous echo in 2017. Left ventricular ejection fraction, by estimation, is 55 to 60%. The left ventricle has normal function. The left ventricular internal cavity size was normal in size. 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 Elevated left atrial pressure. Right Ventricle: The right ventricular size is normal. Right vetricular wall thickness was not assessed. Right ventricular systolic function is normal. There is normal pulmonary artery systolic pressure. The tricuspid regurgitant velocity is 2.31 m/s, and with an assumed right atrial pressure of 3 mmHg, the estimated right ventricular systolic pressure is 41.6 mmHg. Left Atrium: Left atrial size was normal in size. Right Atrium: Right atrial size was normal in size. Pericardium: There is no evidence of pericardial effusion. Mitral Valve: The mitral valve is normal in structure. Mild to moderate mitral valve regurgitation. MV peak gradient, 5.6 mmHg. The mean mitral valve gradient is 2.0 mmHg. Tricuspid Valve: The tricuspid valve is normal in structure.  Tricuspid valve regurgitation is mild. Aortic Valve: The aortic valve is tricuspid. Aortic valve regurgitation is not visualized. Mild to moderate aortic valve sclerosis/calcification is present, without any evidence of aortic stenosis. Aortic valve mean gradient measures 4.0 mmHg. Aortic valve peak gradient measures 7.8 mmHg. Aortic valve area, by VTI measures 1.60 cm. Pulmonic Valve: The pulmonic valve was grossly normal. Pulmonic valve regurgitation is not visualized. Aorta: The aortic root and ascending aorta are structurally normal, with no evidence of dilitation. IAS/Shunts: No atrial level shunt detected by color flow Doppler. Additional Comments: A device lead is visualized.  LEFT VENTRICLE PLAX 2D LVIDd:         4.37 cm      Diastology LVIDs:         3.24 cm      LV e' medial:    4.47 cm/s LV PW:         1.62 cm      LV E/e' medial:  19.1 LV IVS:        1.56 cm      LV e' lateral:   8.11 cm/s LVOT diam:     2.00 cm      LV E/e' lateral: 10.5 LV SV:         52 LV SV Index:   26 LVOT Area:     3.14 cm  LV Volumes (MOD) LV vol d, MOD A2C: 122.0 ml LV vol d, MOD A4C: 138.0 ml LV vol s, MOD A2C: 53.1 ml LV vol s, MOD A4C: 66.8 ml LV SV MOD A2C:     68.9 ml LV SV MOD A4C:     138.0 ml LV SV MOD BP:      72.7 ml RIGHT VENTRICLE RV Basal diam:  3.50 cm RV Mid diam:  2.76 cm RV S prime:     8.19 cm/s TAPSE (M-mode): 2.5 cm LEFT ATRIUM             Index       RIGHT ATRIUM           Index LA diam:        4.10 cm 2.03 cm/m  RA Area:     16.40 cm LA Vol (A2C):   55.3 ml 27.39 ml/m RA Volume:   43.00 ml  21.30 ml/m LA Vol (A4C):   58.4 ml 28.95 ml/m LA Biplane Vol: 61.6 ml 30.51 ml/m  AORTIC VALVE AV Area (Vmax):    1.66 cm AV Area (Vmean):   1.54 cm AV Area (VTI):     1.60 cm AV Vmax:           140.00 cm/s AV Vmean:          97.200 cm/s AV VTI:            0.322 m AV Peak Grad:      7.8 mmHg AV Mean Grad:      4.0 mmHg LVOT Vmax:         73.80 cm/s LVOT Vmean:        47.700 cm/s LVOT VTI:          0.164 m  LVOT/AV VTI ratio: 0.51  AORTA Ao Root diam: 2.90 cm Ao Asc diam:  3.10 cm MITRAL VALVE                 TRICUSPID VALVE MV Area (PHT): 2.71 cm      TR Peak grad:   21.3 mmHg MV Area VTI:   1.53 cm      TR Vmax:        231.00 cm/s MV Peak grad:  5.6 mmHg MV Mean grad:  2.0 mmHg      SHUNTS MV Vmax:       1.18 m/s      Systemic VTI:  0.16 m MV Vmean:      64.4 cm/s     Systemic Diam: 2.00 cm MV Decel Time: 280 msec MR Peak grad:    96.0 mmHg MR Mean grad:    47.0 mmHg MR Vmax:         490.00 cm/s MR Vmean:        291.0 cm/s MR PISA:         1.57 cm MR PISA Eff ROA: 30 mm MR PISA Radius:  0.50 cm MV E velocity: 85.30 cm/s MV A velocity: 91.30 cm/s MV E/A ratio:  0.93 Dorris Carnes MD Electronically signed by Dorris Carnes MD Signature Date/Time: 07/02/2021/6:37:08 PM    Final    CT Angio Chest/Abd/Pel for Dissection W and/or Wo Contrast  Result Date: 07/02/2021 CLINICAL DATA:  Centralized chest pain radiating to the back. Nausea. EXAM: CT ANGIOGRAPHY CHEST, ABDOMEN AND PELVIS TECHNIQUE: Non-contrast CT of the chest was initially obtained. Multidetector CT imaging through the chest, abdomen and pelvis was performed using the standard protocol during bolus administration of intravenous contrast. Multiplanar reconstructed images and MIPs were obtained and reviewed to evaluate the vascular anatomy. CONTRAST:  183mL OMNIPAQUE IOHEXOL 350 MG/ML SOLN COMPARISON:  CT chest 11/04/2016 FINDINGS: CTA CHEST FINDINGS Cardiovascular: Noncontrast images of the chest demonstrate scattered calcification in the aorta. Mild coronary artery calcifications. No evidence of intramural hematoma. Images obtained during the arterial phase after contrast injection demonstrates normal caliber thoracic aorta. No aortic dissection. Great vessel origins are  patent. No evidence of pulmonary embolus. Normal heart size. No pericardial effusion. Cardiac pacemaker. Mediastinum/Nodes: No enlarged mediastinal, hilar, or axillary lymph nodes. Thyroid gland,  trachea, and esophagus demonstrate no significant findings. Lungs/Pleura: Mild atelectasis in the lung bases. Lungs are otherwise clear. No pleural effusions. No pneumothorax. Airways are patent. Musculoskeletal: Degenerative changes in the spine. No destructive bone lesions. Review of the MIP images confirms the above findings. CTA ABDOMEN AND PELVIS FINDINGS VASCULAR Aorta: Diffuse aortic calcification. No aneurysm or dissection. No critical stenosis. Celiac: The celiac axis is patent although there is evidence of focal stenosis at the origin. SMA: Patent without evidence of aneurysm, dissection, vasculitis or significant stenosis. Renals: Both renal arteries are patent without evidence of aneurysm, dissection, vasculitis, fibromuscular dysplasia or significant stenosis. IMA: Patent without evidence of aneurysm, dissection, vasculitis or significant stenosis. Inflow: Patent without evidence of aneurysm, dissection, vasculitis or significant stenosis. Veins: No obvious venous abnormality within the limitations of this arterial phase study. Review of the MIP images confirms the above findings. NON-VASCULAR Hepatobiliary: Several low-attenuation lesions in the liver most likely representing cysts. Gallbladder is mildly distended without stone or wall thickening. No bile duct dilatation. Pancreas: Mildly dilated pancreatic duct. No discrete mass is identified. Consider nonemergent MRCP for follow-up. Spleen: Normal in size without focal abnormality. Adrenals/Urinary Tract: Adrenal glands are unremarkable. Kidneys are normal, without renal calculi, focal lesion, or hydronephrosis. Bladder is unremarkable. Stomach/Bowel: Stomach is within normal limits. Appendix appears normal. No evidence of bowel wall thickening, distention, or inflammatory changes. Lymphatic: No significant lymphadenopathy. Reproductive: Uterus and ovaries are not enlarged. Other: No free air or free fluid in the abdomen. Small periumbilical hernia  containing fat. Midline abdominal wall scarring is likely postoperative. Musculoskeletal: Degenerative changes in the spine. No destructive bone lesions. Review of the MIP images confirms the above findings. IMPRESSION: 1. Moderate diffuse aortic calcifications/atherosclerosis. No evidence of aortic aneurysm or dissection. 2. No evidence of active pulmonary disease. 3. Dilated pancreatic duct without discrete mass identified. Consider nonemergent MRCP for follow-up. 4. Small periumbilical hernia containing fat. Electronically Signed   By: Lucienne Capers M.D.   On: 07/02/2021 01:13   Disposition   Pt is being discharged home today in good condition.  Follow-up Plans & Appointments    Follow-up Information     Evans Lance, MD. Call.   Specialty: Cardiology Why: The office will call with date and time of appointment Contact information: [address] [place] [address] [phone]                Discharge Medications   Allergies as of 07/03/2021       Reactions   Penicillins Hives, Itching, Other (See Comments)   Has patient had a PCN reaction causing immediate rash, facial/tongue/throat swelling, SOB or lightheadedness with hypotension: No Has patient had a PCN reaction causing severe rash involving mucus membranes or skin necrosis: Yes Has patient had a PCN reaction that required hospitalization No Has patient had a PCN reaction occurring within the last 10 years: No If all of the above answers are "NO", then may proceed with Cephalosporin use.        Medication List     TAKE these medications    glucose blood test strip Use to check blood glucose fasting , before lunch and dinner and after your largest meal   hydrALAZINE 25 MG tablet Commonly known as: APRESOLINE Take 1 tablet (25 mg total) by mouth 2 (two) times daily. What changed:  medication strength how much to take when to take this   Lantus SoloStar 100 UNIT/ML Solostar  Pen Generic drug: insulin glargine Inject 30 Units into the skin at bedtime.   metoprolol succinate 25 MG 24 hr tablet Commonly known as: TOPROL-XL Take 1 tablet (25 mg total) by mouth daily.   nitroGLYCERIN 0.4 MG SL tablet Commonly known as: NITROSTAT Place 1 tablet (0.4 mg total) under the tongue every 5 (five) minutes as needed for chest pain.   onetouch ultrasoft lancets Use to check glucose 4 x daily       ASK your doctor about these medications    atorvastatin 40 MG tablet Commonly known as: LIPITOR TAKE (1) TABLET BY MOUTH AT BEDTIME.   Eliquis 5 MG Tabs tablet Generic drug: apixaban TAKE 1 TABLET BY MOUTH TWICE A DAY.   losartan 100 MG tablet Commonly known as: COZAAR TAKE (1) TABLET BY MOUTH ONCE DAILY.   metFORMIN 1000 MG tablet Commonly known as: GLUCOPHAGE TAKE (1) TABLET BY MOUTH TWICE DAILY.   NIFEdipine 30 MG 24 hr tablet Commonly known as: PROCARDIA-XL/NIFEDICAL-XL TAKE 1 TABLET BY MOUTH ONCE A DAY.        Outstanding Labs/Studies   None   Duration of Discharge Encounter   Greater than 30 minutes including physician time.  Signed, Kathyrn Drown, NP 07/03/2021, 1:33 PM

## 2021-07-03 NOTE — Progress Notes (Signed)
Progress Note  Patient Name: Natalie Castro Date of Encounter: 07/03/2021  Primary Cardiologist: Cristopher Peru, MD     Patient Profile     80 y.o. 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 Echo  EF 55-60 with HK inferolateral walls  ECG Sinus with prolonged AV timing related to pacemaker-VIP Subjective   Without chest pain Reviewed the results of her cath   Inpatient Medications    Scheduled Meds:  aspirin EC  81 mg Oral Daily   atorvastatin  40 mg Oral Daily   hydrALAZINE  50 mg Oral BID   insulin aspart  0-15 Units Subcutaneous TID WC   insulin glargine  15 Units Subcutaneous QHS   losartan  100 mg Oral Daily   metoprolol succinate  25 mg Oral Daily   sodium chloride flush  3 mL Intravenous Q12H   Continuous Infusions:  sodium chloride 100 mL/hr at 07/03/21 0348   sodium chloride     PRN Meds: sodium chloride, acetaminophen **OR** acetaminophen, morphine injection, nitroGLYCERIN, ondansetron **OR** ondansetron (ZOFRAN) IV, sodium chloride flush   Vital Signs    Vitals:   07/02/21 2059 07/03/21 0010 07/03/21 0407 07/03/21 0800  BP: (!) 125/53 128/70 (!) 90/37 139/66  Pulse:  71 74 85  Resp:  16 19 18   Temp:   98.5 F (36.9 C) 99.6 F (37.6 C)  TempSrc:   Oral Oral  SpO2:  96% 97% 98%  Weight:      Height:        Intake/Output Summary (Last 24 hours) at 07/03/2021 1134 Last data filed at 07/03/2021 0800 Gross per 24 hour  Intake 1129.61 ml  Output 4 ml  Net 1125.61 ml   Filed Weights   07/01/21 2245 07/02/21 0238  Weight: 96.2 kg 95.2 kg    Telemetry    NSR - Personally Reviewed  ECG     - Personally Reviewed  Physical Exam    GEN: No acute distress.   Neck: No JVD Cardiac: RRR, no murmurs,  rubs, or gallops.  Respiratory: Clear to auscultation bilaterally. GI: Soft, nontender, non-distended  MS: No edema; No deformity. Neuro:  Nonfocal  Psych: Normal affect   Labs    Chemistry Recent Labs  Lab 07/01/21 2247  NA 139  K 3.9  CL 108  CO2 26  GLUCOSE 208*  BUN 13  CREATININE 0.72  CALCIUM 8.6*  PROT 7.3  ALBUMIN 3.2*  AST 12*  ALT 10  ALKPHOS 73  BILITOT 0.3  GFRNONAA >60  ANIONGAP 5     Hematology Recent Labs  Lab 07/01/21 2247  WBC 8.1  RBC 4.08  HGB 12.4  HCT 39.7  MCV 97.3  MCH 30.4  MCHC 31.2  RDW 14.7  PLT 173    Cardiac EnzymesNo results for input(s): TROPONINI in the last 168 hours. No results for input(s): TROPIPOC in the last 168 hours.   BNPNo results for input(s): BNP, PROBNP in the last 168 hours.   DDimer No results for input(s): DDIMER in the last 168 hours.   Radiology    CARDIAC CATHETERIZATION  Addendum Date: 07/02/2021    The left ventricular systolic function is normal.  LV end diastolic pressure is mildly elevated.  LVEDP 17 mm Hg.  The left ventricular ejection fraction is 55-65% by visual estimate.  There is no aortic valve stenosis.  Mild nonobstructive coroanry artery disease.  Tortuous vessels. Catheter induced spasm of the ostial RCA.  Mild radial vasospasm.  Continue preventive therapy.  Low level troponin elevation may be related to her rhythm disturbance noted on ECG.    Addendum Date: 07/02/2021    The left ventricular systolic function is normal.  LV end diastolic pressure is mildly elevated. LVEDP 17 mm Hg.  The left ventricular ejection fraction is 55-65% by visual estimate.  There is no aortic valve stenosis.  Mild nonobstructive coroanry artery disease.  Tortuous vessels. Catheter induced spasm of the ostial RCA.  Mild radial vasospasm.  Continue preventive therapy.   Result Date: 07/02/2021  The left ventricular systolic function is normal.  LV end diastolic pressure is mildly elevated. LVEDP 17 mm  Hg.  The left ventricular ejection fraction is 55-65% by visual estimate.  There is no aortic valve stenosis.  Mild nonobstructive coroanry artery disease.  Tortuous vessels. Catheter induced spasm of the ostial RCA.  Mild radial vasospasm.  Continue preventive therapy.   DG Chest Portable 1 View  Result Date: 07/01/2021 CLINICAL DATA:  Centralized chest pain radiating to the back. Nausea and vomiting. EXAM: PORTABLE CHEST 1 VIEW COMPARISON:  02/23/2021 FINDINGS: Cardiac pacemaker. Borderline heart size with normal pulmonary vascularity. No airspace disease or consolidation in the lungs. No pleural effusions. No pneumothorax. Mediastinal contours appear intact. Calcification of the aorta. IMPRESSION: Borderline heart size.  No active pulmonary disease. Electronically Signed   By: Lucienne Capers M.D.   On: 07/01/2021 23:23   ECHOCARDIOGRAM COMPLETE  Result Date: 07/02/2021    ECHOCARDIOGRAM REPORT   Patient Name:   Natalie Castro Date of Exam: 07/02/2021 Medical Rec #:  [id number]    Height:       65.0 in Accession #:    [id number]   Weight:       209.9 lb Date of Birth:  11/23/1941    BSA:          2.019 m Patient Age:    55 years     BP:           141/88 mmHg Patient Gender: F            HR:           80 bpm. Exam Location:  [facility] Procedure: 2D Echo, Cardiac Doppler and Color Doppler Indications:    Chest Pain  History:        Patient has prior history of Echocardiogram examinations, most                 recent 10/26/2018. Pacemaker; Risk Factors:Hypertension, Diabetes                 and Dyslipidemia.  Sonographer:    Wenda Low Referring Phys: [id number] [place]  1. Hypokinesis of the distal inferior/inferoseptal walls. Changes appear more prominent than in previous echo in 2017. Marland Kitchen Left ventricular ejection fraction, by estimation, is 55 to 60%. The left ventricle has normal function. 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 Elevated left atrial pressure.  2. Right  ventricular systolic function is normal. The right ventricular size is normal. There is normal pulmonary artery systolic pressure.  3. The mitral valve is normal in structure. Mild to moderate mitral valve regurgitation.  4. The aortic valve is tricuspid. Aortic valve regurgitation is not  visualized. Mild to moderate aortic valve sclerosis/calcification is present, without any evidence of aortic stenosis. FINDINGS  Left Ventricle: Hypokinesis of the distal inferior/inferoseptal walls. Changes appear more prominent than in previous echo in 2017. Left ventricular ejection fraction, by estimation, is 55 to 60%. The left ventricle has normal function. The left ventricular internal cavity size was normal in size. 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 Elevated left atrial pressure. Right Ventricle: The right ventricular size is normal. Right vetricular wall thickness was not assessed. Right ventricular systolic function is normal. There is normal pulmonary artery systolic pressure. The tricuspid regurgitant velocity is 2.31 m/s, and with an assumed right atrial pressure of 3 mmHg, the estimated right ventricular systolic pressure is 60.1 mmHg. Left Atrium: Left atrial size was normal in size. Right Atrium: Right atrial size was normal in size. Pericardium: There is no evidence of pericardial effusion. Mitral Valve: The mitral valve is normal in structure. Mild to moderate mitral valve regurgitation. MV peak gradient, 5.6 mmHg. The mean mitral valve gradient is 2.0 mmHg. Tricuspid Valve: The tricuspid valve is normal in structure. Tricuspid valve regurgitation is mild. Aortic Valve: The aortic valve is tricuspid. Aortic valve regurgitation is not visualized. Mild to moderate aortic valve sclerosis/calcification is present, without any evidence of aortic stenosis. Aortic valve mean gradient measures 4.0 mmHg. Aortic valve peak gradient measures 7.8 mmHg. Aortic valve area, by VTI measures 1.60 cm. Pulmonic Valve:  The pulmonic valve was grossly normal. Pulmonic valve regurgitation is not visualized. Aorta: The aortic root and ascending aorta are structurally normal, with no evidence of dilitation. IAS/Shunts: No atrial level shunt detected by color flow Doppler. Additional Comments: A device lead is visualized.  LEFT VENTRICLE PLAX 2D LVIDd:         4.37 cm      Diastology LVIDs:         3.24 cm      LV e' medial:    4.47 cm/s LV PW:         1.62 cm      LV E/e' medial:  19.1 LV IVS:        1.56 cm      LV e' lateral:   8.11 cm/s LVOT diam:     2.00 cm      LV E/e' lateral: 10.5 LV SV:         52 LV SV Index:   26 LVOT Area:     3.14 cm  LV Volumes (MOD) LV vol d, MOD A2C: 122.0 ml LV vol d, MOD A4C: 138.0 ml LV vol s, MOD A2C: 53.1 ml LV vol s, MOD A4C: 66.8 ml LV SV MOD A2C:     68.9 ml LV SV MOD A4C:     138.0 ml LV SV MOD BP:      72.7 ml RIGHT VENTRICLE RV Basal diam:  3.50 cm RV Mid diam:    2.76 cm RV S prime:     8.19 cm/s TAPSE (M-mode): 2.5 cm LEFT ATRIUM             Index       RIGHT ATRIUM           Index LA diam:        4.10 cm 2.03 cm/m  RA Area:     16.40 cm LA Vol (A2C):   55.3 ml 27.39 ml/m RA Volume:   43.00 ml  21.30 ml/m LA Vol (A4C):   58.4 ml 28.95 ml/m LA Biplane Vol:  61.6 ml 30.51 ml/m  AORTIC VALVE AV Area (Vmax):    1.66 cm AV Area (Vmean):   1.54 cm AV Area (VTI):     1.60 cm AV Vmax:           140.00 cm/s AV Vmean:          97.200 cm/s AV VTI:            0.322 m AV Peak Grad:      7.8 mmHg AV Mean Grad:      4.0 mmHg LVOT Vmax:         73.80 cm/s LVOT Vmean:        47.700 cm/s LVOT VTI:          0.164 m LVOT/AV VTI ratio: 0.51  AORTA Ao Root diam: 2.90 cm Ao Asc diam:  3.10 cm MITRAL VALVE                 TRICUSPID VALVE MV Area (PHT): 2.71 cm      TR Peak grad:   21.3 mmHg MV Area VTI:   1.53 cm      TR Vmax:        231.00 cm/s MV Peak grad:  5.6 mmHg MV Mean grad:  2.0 mmHg      SHUNTS MV Vmax:       1.18 m/s      Systemic VTI:  0.16 m MV Vmean:      64.4 cm/s     Systemic Diam: 2.00  cm MV Decel Time: 280 msec MR Peak grad:    96.0 mmHg MR Mean grad:    47.0 mmHg MR Vmax:         490.00 cm/s MR Vmean:        291.0 cm/s MR PISA:         1.57 cm MR PISA Eff ROA: 30 mm MR PISA Radius:  0.50 cm MV E velocity: 85.30 cm/s MV A velocity: 91.30 cm/s MV E/A ratio:  0.93 Dorris Carnes MD Electronically signed by Dorris Carnes MD Signature Date/Time: 07/02/2021/6:37:08 PM    Final    CT Angio Chest/Abd/Pel for Dissection W and/or Wo Contrast  Result Date: 07/02/2021 CLINICAL DATA:  Centralized chest pain radiating to the back. Nausea. EXAM: CT ANGIOGRAPHY CHEST, ABDOMEN AND PELVIS TECHNIQUE: Non-contrast CT of the chest was initially obtained. Multidetector CT imaging through the chest, abdomen and pelvis was performed using the standard protocol during bolus administration of intravenous contrast. Multiplanar reconstructed images and MIPs were obtained and reviewed to evaluate the vascular anatomy. CONTRAST:  123mL OMNIPAQUE IOHEXOL 350 MG/ML SOLN COMPARISON:  CT chest 11/04/2016 FINDINGS: CTA CHEST FINDINGS Cardiovascular: Noncontrast images of the chest demonstrate scattered calcification in the aorta. Mild coronary artery calcifications. No evidence of intramural hematoma. Images obtained during the arterial phase after contrast injection demonstrates normal caliber thoracic aorta. No aortic dissection. Great vessel origins are patent. No evidence of pulmonary embolus. Normal heart size. No pericardial effusion. Cardiac pacemaker. Mediastinum/Nodes: No enlarged mediastinal, hilar, or axillary lymph nodes. Thyroid gland, trachea, and esophagus demonstrate no significant findings. Lungs/Pleura: Mild atelectasis in the lung bases. Lungs are otherwise clear. No pleural effusions. No pneumothorax. Airways are patent. Musculoskeletal: Degenerative changes in the spine. No destructive bone lesions. Review of the MIP images confirms the above findings. CTA ABDOMEN AND PELVIS FINDINGS VASCULAR Aorta: Diffuse  aortic calcification. No aneurysm or dissection. No critical stenosis. Celiac: The celiac axis is patent although there is evidence of focal stenosis  at the origin. SMA: Patent without evidence of aneurysm, dissection, vasculitis or significant stenosis. Renals: Both renal arteries are patent without evidence of aneurysm, dissection, vasculitis, fibromuscular dysplasia or significant stenosis. IMA: Patent without evidence of aneurysm, dissection, vasculitis or significant stenosis. Inflow: Patent without evidence of aneurysm, dissection, vasculitis or significant stenosis. Veins: No obvious venous abnormality within the limitations of this arterial phase study. Review of the MIP images confirms the above findings. NON-VASCULAR Hepatobiliary: Several low-attenuation lesions in the liver most likely representing cysts. Gallbladder is mildly distended without stone or wall thickening. No bile duct dilatation. Pancreas: Mildly dilated pancreatic duct. No discrete mass is identified. Consider nonemergent MRCP for follow-up. Spleen: Normal in size without focal abnormality. Adrenals/Urinary Tract: Adrenal glands are unremarkable. Kidneys are normal, without renal calculi, focal lesion, or hydronephrosis. Bladder is unremarkable. Stomach/Bowel: Stomach is within normal limits. Appendix appears normal. No evidence of bowel wall thickening, distention, or inflammatory changes. Lymphatic: No significant lymphadenopathy. Reproductive: Uterus and ovaries are not enlarged. Other: No free air or free fluid in the abdomen. Small periumbilical hernia containing fat. Midline abdominal wall scarring is likely postoperative. Musculoskeletal: Degenerative changes in the spine. No destructive bone lesions. Review of the MIP images confirms the above findings. IMPRESSION: 1. Moderate diffuse aortic calcifications/atherosclerosis. No evidence of aortic aneurysm or dissection. 2. No evidence of active pulmonary disease. 3. Dilated  pancreatic duct without discrete mass identified. Consider nonemergent MRCP for follow-up. 4. Small periumbilical hernia containing fat. Electronically Signed   By: Lucienne Capers M.D.   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  Will need pacemaker reprogramming as an outpatient.  Discontinue aspirin and resume Eliquis. Variable BP will have her decrease her hydralazine to 25 bid   For questions or updates, please contact [place] Please consult [email or web address] for contact info under Cardiology/STEMI.      Signed, Virl Axe, MD  07/03/2021, 11:34 AM

## 2021-07-04 ENCOUNTER — Other Ambulatory Visit (HOSPITAL_COMMUNITY): Payer: Medicare Other

## 2021-07-04 ENCOUNTER — Inpatient Hospital Stay (HOSPITAL_COMMUNITY): Payer: Medicare Other

## 2021-07-04 DIAGNOSIS — R1010 Upper abdominal pain, unspecified: Secondary | ICD-10-CM

## 2021-07-04 DIAGNOSIS — I10 Essential (primary) hypertension: Secondary | ICD-10-CM

## 2021-07-04 DIAGNOSIS — I249 Acute ischemic heart disease, unspecified: Secondary | ICD-10-CM | POA: Diagnosis not present

## 2021-07-04 DIAGNOSIS — R6510 Systemic inflammatory response syndrome (SIRS) of non-infectious origin without acute organ dysfunction: Secondary | ICD-10-CM

## 2021-07-04 LAB — CBC WITH DIFFERENTIAL/PLATELET
Abs Immature Granulocytes: 0.24 10*3/uL — ABNORMAL HIGH (ref 0.00–0.07)
Basophils Absolute: 0 10*3/uL (ref 0.0–0.1)
Basophils Relative: 0 %
Eosinophils Absolute: 0 10*3/uL (ref 0.0–0.5)
Eosinophils Relative: 0 %
HCT: 37.3 % (ref 36.0–46.0)
Hemoglobin: 12.2 g/dL (ref 12.0–15.0)
Immature Granulocytes: 1 %
Lymphocytes Relative: 6 %
Lymphs Abs: 1.2 10*3/uL (ref 0.7–4.0)
MCH: 30.6 pg (ref 26.0–34.0)
MCHC: 32.7 g/dL (ref 30.0–36.0)
MCV: 93.5 fL (ref 80.0–100.0)
Monocytes Absolute: 1.3 10*3/uL — ABNORMAL HIGH (ref 0.1–1.0)
Monocytes Relative: 7 %
Neutro Abs: 16 10*3/uL — ABNORMAL HIGH (ref 1.7–7.7)
Neutrophils Relative %: 86 %
Platelets: 153 10*3/uL (ref 150–400)
RBC: 3.99 MIL/uL (ref 3.87–5.11)
RDW: 14.6 % (ref 11.5–15.5)
WBC: 18.7 10*3/uL — ABNORMAL HIGH (ref 4.0–10.5)
nRBC: 0 % (ref 0.0–0.2)

## 2021-07-04 LAB — BASIC METABOLIC PANEL
Anion gap: 8 (ref 5–15)
BUN: 11 mg/dL (ref 8–23)
CO2: 22 mmol/L (ref 22–32)
Calcium: 8.6 mg/dL — ABNORMAL LOW (ref 8.9–10.3)
Chloride: 104 mmol/L (ref 98–111)
Creatinine, Ser: 0.95 mg/dL (ref 0.44–1.00)
GFR, Estimated: >60 mL/min (ref >60)
Glucose, Bld: 184 mg/dL — ABNORMAL HIGH (ref 70–99)
Potassium: 3.6 mmol/L (ref 3.5–5.1)
Sodium: 134 mmol/L — ABNORMAL LOW (ref 135–145)

## 2021-07-04 LAB — LIPASE, BLOOD: Lipase: 23 U/L (ref 11–51)

## 2021-07-04 LAB — GLUCOSE, CAPILLARY
Glucose-Capillary: 232 mg/dL — ABNORMAL HIGH (ref 70–99)
Glucose-Capillary: 167 mg/dL — ABNORMAL HIGH (ref 70–99)
Glucose-Capillary: 175 mg/dL — ABNORMAL HIGH (ref 70–99)
Glucose-Capillary: 182 mg/dL — ABNORMAL HIGH (ref 70–99)

## 2021-07-04 LAB — MAGNESIUM: Magnesium: 1.5 mg/dL — ABNORMAL LOW (ref 1.7–2.4)

## 2021-07-04 LAB — LACTIC ACID, PLASMA
Lactic Acid, Venous: 2.3 mmol/L (ref 0.5–1.9)
Lactic Acid, Venous: 1.3 mmol/L (ref 0.5–1.9)

## 2021-07-04 MED ORDER — MAGNESIUM SULFATE 2 GM/50ML IV SOLN
2.0000 g | Freq: Once | INTRAVENOUS | Status: AC
  Administered 2021-07-04: 2 g via INTRAVENOUS
  Filled 2021-07-04: qty 50

## 2021-07-04 MED ORDER — APIXABAN 5 MG PO TABS: 5.0000 mg | ORAL_TABLET | Freq: Two times a day (BID) | ORAL | Status: DC

## 2021-07-04 MED ORDER — SODIUM CHLORIDE 0.9 % IV SOLN: Freq: Once | INTRAVENOUS | Status: AC

## 2021-07-04 MED ORDER — POTASSIUM CHLORIDE CRYS ER 20 MEQ PO TBCR
40.0000 meq | EXTENDED_RELEASE_TABLET | ORAL | Status: AC
  Administered 2021-07-04: 40 meq via ORAL
  Filled 2021-07-04: qty 2

## 2021-07-04 MED ORDER — APIXABAN 5 MG PO TABS
5.0000 mg | ORAL_TABLET | Freq: Once | ORAL | Status: AC
  Administered 2021-07-04: 5 mg via ORAL
  Filled 2021-07-04: qty 1

## 2021-07-04 MED ORDER — HYDRALAZINE HCL 50 MG PO TABS
50.0000 mg | ORAL_TABLET | Freq: Two times a day (BID) | ORAL | Status: DC
  Administered 2021-07-04 – 2021-07-11 (×14): 50 mg via ORAL
  Filled 2021-07-04 (×14): qty 1

## 2021-07-04 MED ORDER — SODIUM CHLORIDE 0.9 % IV SOLN
2.0000 g | INTRAVENOUS | Status: DC
  Administered 2021-07-04: 2 g via INTRAVENOUS
  Filled 2021-07-04: qty 2
  Filled 2021-07-04 (×2): qty 20

## 2021-07-04 MED ORDER — ENOXAPARIN SODIUM 100 MG/ML IJ SOSY
90.0000 mg | PREFILLED_SYRINGE | Freq: Two times a day (BID) | INTRAMUSCULAR | Status: DC
  Administered 2021-07-04 – 2021-07-07 (×6): 90 mg via SUBCUTANEOUS
  Filled 2021-07-04 (×6): qty 1

## 2021-07-04 MED ORDER — METRONIDAZOLE 500 MG/100ML IV SOLN
500.0000 mg | Freq: Three times a day (TID) | INTRAVENOUS | Status: DC
  Administered 2021-07-04 – 2021-07-05 (×3): 500 mg via INTRAVENOUS
  Filled 2021-07-04 (×4): qty 100

## 2021-07-04 MED ORDER — MAGNESIUM SULFATE 2 GM/50ML IV SOLN: 2.0000 g | Freq: Once | INTRAVENOUS | Status: DC

## 2021-07-04 NOTE — Progress Notes (Signed)
   07/04/21 0415  Assess: MEWS Score  Temp (!) 101.5 F (38.6 C)  BP (!) 145/57  Pulse Rate 99  ECG Heart Rate 100  Resp 20  Level of Consciousness Alert  SpO2 98 %  O2 Device Room Air  Assess: MEWS Score  MEWS Temp 2  MEWS Systolic 0  MEWS Pulse 0  MEWS RR 0  MEWS LOC 0  MEWS Score 2  MEWS Score Color Yellow  Assess: if the MEWS score is Yellow or Red  Were vital signs taken at a resting state? Yes  Focused Assessment No change from prior assessment  Early Detection of Sepsis Score *See Row Information* High  MEWS guidelines implemented *See Row Information* Yes  Take Vital Signs  Increase Vital Sign Frequency  Yellow: Q 2hr X 2 then Q 4hr X 2, if remains yellow, continue Q 4hrs  Escalate  MEWS: Escalate Yellow: discuss with charge nurse/RN and consider discussing with provider and RRT  Notify: Charge Nurse/RN  Name of Charge Nurse/RN Notified Mariah RN  Date Charge Nurse/RN Notified 07/04/21  Time Charge Nurse/RN Notified [phone]  Document  Patient Outcome Stabilized after interventions  Progress note created (see row info) Yes

## 2021-07-04 NOTE — Progress Notes (Addendum)
Progress Note    Natalie Castro  QAS:[id number] DOB: 28-Apr-1941  DOA: 07/01/2021 PCP: [name], MD    Brief Narrative:   Chief complaint: Chest pain  Medical records reviewed and are as summarized below:  Natalie Castro is an 80 y.o. 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  Initial labs revealed high-sensitivity troponin of 108->96 with LFTs and lipase within normal limits.  She was transferred to [facility] under the care of cardiology and had left heart cath performed on 7/8.  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  Echocardiogram revealed EF of 55-60% with hypokinesis of the distal inferior/inferoseptal walls.  Assessment/Plan:    SIRS: Acute.  Patient was noted to be febrile up to 101.3 F with tachypnea.  With the reports of nausea and vomiting question the possibility of aspiration.  Chest and abdominal x-ray did not show any acute abnormalities. -Check blood cultures and lactic acid -Start empiric antibiotics of Rocephin and metronidazole -Normal saline IV fluids at 75 mL per  Abdominal pain  nausea and vomiting: Patient reports having epigastric abdominal pain with radiation to her back.  CT a of the chest abdomen and pelvis have been obtained on 7/8 at that time it had noted mild gallbladder distention and dilatation of the pancreatic duct.  Right upper quadrant ultrasound had been ordered.  Question the possibility of cholecystitis or pancreatitis as cause of symptoms. -Follow-up right upper quadrant ultrasound of the abdomen -Recheck lipase -Will consider need to consult general surgery or gastroenterology possibly based off the imaging  Hypomagnesemia: Acute.  Magnesium was noted to be 1.6 on 7/8.  Does not appear the patient's  magnesium level was ever treated. -Give 2 g of magnesium sulfate IV -Continue to monitor magnesium level  Chest pain elevated troponin  nonobstructive coronary artery disease: Patient had initially been found to have elevated troponin of 108->96. Left heart catheterization also reported to show mild nonobstructive coronary artery disease with catheter induced spasm.  Aspirin has been discontinued after patient had been restarted on Eliquis. 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 Lovena Le.  -Cardiology recommends outpatient follow-up for pacemaker reprogramming  Essential hypertension: Systolic blood pressures currently around 160.  Home medications appear to include included nifedipine 30 mg daily, losartan 100 mg daily, hydralazine 50 mg twice daily, and metoprolol succinate 25 mg daily -Continue losartan, hydralazine, and metoprolol -Deferring to cardiology in regards whether they wanted patient to remain on nifedipine  Hyperlipidemia -Continue atorvastatin  History of PE on chronic anticoagulation -Eliquis transition to full dose Lovenox encase of possible need of procedure   Body mass index is 34.93 kg/m.   Family Communication/Anticipated D/C date and plan/Code Status   DVT prophylaxis: Full dose Lovenox Code Status: Full Code.  Family Communication: Daughter updated at bedside Disposition Plan: Likely discharge home in next 2 to 3 days   Medical Consultants:   Cardiology   Anti-Infectives:   Rocephin and metronidazole IV  Subjective:   Patient reports that she has been having nausea and vomiting since Thursday when she initially came into anything hospital.  Complains of epigastric abdominal pain with some radiation to her back. She has not really  eaten much today due to her symptoms. Objective:    Vitals:   07/03/21 1959  07/04/21 0415 07/04/21 0538 07/04/21 0800  BP: (!) 168/83 (!) 145/57  (!) 161/76  Pulse: 83 99  96  Resp: 20 20 20  (!) 24  Temp: 98.2 F (36.8 C) (!) 101.5 F (38.6 C) 100.2 F (37.9 C) 98.2 F (36.8 C)  TempSrc: Oral Oral Oral Oral  SpO2: 98% 98%    Weight:      Height:        Intake/Output Summary (Last 24 hours) at 07/04/2021 1021 Last data filed at 07/04/2021 0300 Gross per 24 hour  Intake 240 ml  Output 900 ml  Net -660 ml   Filed Weights   07/01/21 2245 07/02/21 0238  Weight: 96.2 kg 95.2 kg    Exam: Constitutional: Elderly female who appears to be fatigued Eyes: PERRL, lids and conjunctivae normal ENMT: Mucous membranes are dry. Posterior pharynx clear of any exudate or lesions.  Neck: normal, supple, no masses, no thyromegaly.  No JVD appreciated. Respiratory: clear to auscultation bilaterally, no wheezing, no crackles. Normal respiratory effort. No accessory muscle use.  Cardiovascular: Regular rate and rhythm, no murmurs / rubs / gallops.  Trace lower extremity edema. 2+ pedal pulses. No carotid bruits.  Abdomen: Tenderness to palpation appreciated in the upper right quadrant of the abdomen as well as epigastrically, but also reported some discomfort with palpation of the lower quadrants as well.  Bowel sounds present in all 4 quadrants. Musculoskeletal: no clubbing / cyanosis. No joint deformity upper and lower extremities. Good ROM, no contractures. Normal muscle tone.  Skin: no rashes, lesions, ulcers. No induration Neurologic: CN 2-12 grossly intact. Sensation intact, DTR normal. Strength 5/5 in all 4.  Psychiatric: Normal judgment and insight.  Lethargic, but oriented x 3. Normal mood.    Data Reviewed:   I have personally reviewed following labs and imaging studies:  Labs: Labs show the following:   Basic Metabolic Panel: Recent Labs  Lab 07/01/21 2247 07/02/21 1102  NA 139  --   K 3.9  --   CL 108  --   CO2 26  --   GLUCOSE 208*  --   BUN 13   --   CREATININE 0.72  --   CALCIUM 8.6*  --   MG  --  1.6*   GFR Estimated Creatinine Clearance: 65.1 mL/min (by C-G formula based on SCr of 0.72 mg/dL). Liver Function Tests: Recent Labs  Lab 07/01/21 2247  AST 12*  ALT 10  ALKPHOS 73  BILITOT 0.3  PROT 7.3  ALBUMIN 3.2*   Recent Labs  Lab 07/01/21 2247  LIPASE 37   No results for input(s): AMMONIA in the last 168 hours. Coagulation profile No results for input(s): INR, PROTIME in the last 168 hours.  CBC: Recent Labs  Lab 07/01/21 2247  WBC 8.1  HGB 12.4  HCT 39.7  MCV 97.3  PLT 173   Cardiac Enzymes: No results for input(s): CKTOTAL, CKMB, CKMBINDEX, TROPONINI in the last 168 hours. BNP (last 3 results) No results for input(s): PROBNP in the last 8760 hours. CBG: Recent Labs  Lab 07/03/21 0840 07/03/21 1201 07/03/21 1747 07/03/21 2146 07/04/21 0753  GLUCAP 148* 139* 272* 232* 232*   D-Dimer: No results for input(s): DDIMER in the last 72 hours. Hgb A1c: Recent Labs    07/02/21 0155  HGBA1C 7.4*   Lipid Profile: No results for input(s): CHOL, HDL, LDLCALC, TRIG, CHOLHDL, LDLDIRECT in the  last 72 hours. Thyroid function studies: No results for input(s): TSH, T4TOTAL, T3FREE, THYROIDAB in the last 72 hours.  Invalid input(s): FREET3 Anemia work up: No results for input(s): VITAMINB12, FOLATE, FERRITIN, TIBC, IRON, RETICCTPCT in the last 72 hours. Sepsis Labs: Recent Labs  Lab 07/01/21 2247  WBC 8.1    Microbiology Recent Results (from the past 240 hour(s))  Resp Panel by RT-PCR (Flu A&B, Covid) Nasopharyngeal Swab     Status: None   Collection Time: 07/02/21  1:45 AM   Specimen: Nasopharyngeal Swab; Nasopharyngeal(NP) swabs in vial transport medium  Result Value Ref Range Status   SARS Coronavirus 2 by RT PCR NEGATIVE NEGATIVE Final    Comment: (NOTE) SARS-CoV-2 target nucleic acids are NOT DETECTED.  The SARS-CoV-2 RNA is generally detectable in upper respiratory specimens during  the acute phase of infection. The lowest concentration of SARS-CoV-2 viral copies this assay can detect is 138 copies/mL. A negative result does not preclude SARS-Cov-2 infection and should not be used as the sole basis for treatment or other patient management decisions. A negative result may occur with  improper specimen collection/handling, submission of specimen other than nasopharyngeal swab, presence of viral mutation(s) within the areas targeted by this assay, and inadequate number of viral copies(<138 copies/mL). A negative result must be combined with clinical observations, patient history, and epidemiological information. The expected result is Negative.  Fact Sheet for Patients:  EntrepreneurPulse.com.au  Fact Sheet for Healthcare Providers:  IncredibleEmployment.be  This test is no t yet approved or cleared by the [place] FDA and  has been authorized for detection and/or diagnosis of SARS-CoV-2 by FDA under an Emergency Use Authorization (EUA). This EUA will remain  in effect (meaning this test can be used) for the duration of the COVID-19 declaration under Section 564(b)(1) of the Act, 21 U.S.C.section 360bbb-3(b)(1), unless the authorization is terminated  or revoked sooner.       Influenza A by PCR NEGATIVE NEGATIVE Final   Influenza B by PCR NEGATIVE NEGATIVE Final    Comment: (NOTE) The Xpert Xpress SARS-CoV-2/FLU/RSV plus assay is intended as an aid in the diagnosis of influenza from Nasopharyngeal swab specimens and should not be used as a sole basis for treatment. Nasal washings and aspirates are unacceptable for Xpert Xpress SARS-CoV-2/FLU/RSV testing.  Fact Sheet for Patients: EntrepreneurPulse.com.au  Fact Sheet for Healthcare Providers: IncredibleEmployment.be  This test is not yet approved or cleared by the [place] FDA and has been authorized for detection and/or  diagnosis of SARS-CoV-2 by FDA under an Emergency Use Authorization (EUA). This EUA will remain in effect (meaning this test can be used) for the duration of the COVID-19 declaration under Section 564(b)(1) of the Act, 21 U.S.C. section 360bbb-3(b)(1), unless the authorization is terminated or revoked.  Performed at [facility], [address], [place], [place] [address]     Procedures and diagnostic studies:  CARDIAC CATHETERIZATION  Addendum Date: 07/02/2021    The left ventricular systolic function is normal.  LV end diastolic pressure is mildly elevated. LVEDP 17 mm Hg.  The left ventricular ejection fraction is 55-65% by visual estimate.  There is no aortic valve stenosis.  Mild nonobstructive coroanry artery disease.  Tortuous vessels. Catheter induced spasm of the ostial RCA.  Mild radial vasospasm.  Continue preventive therapy.  Low level troponin elevation may be related to her rhythm disturbance noted on ECG.    Addendum Date: 07/02/2021    The left ventricular systolic function is normal.  LV end  diastolic pressure is mildly elevated. LVEDP 17 mm Hg.  The left ventricular ejection fraction is 55-65% by visual estimate.  There is no aortic valve stenosis.  Mild nonobstructive coroanry artery disease.  Tortuous vessels. Catheter induced spasm of the ostial RCA.  Mild radial vasospasm.  Continue preventive therapy.   Result Date: 07/02/2021  The left ventricular systolic function is normal.  LV end diastolic pressure is mildly elevated. LVEDP 17 mm Hg.  The left ventricular ejection fraction is 55-65% by visual estimate.  There is no aortic valve stenosis.  Mild nonobstructive coroanry artery disease.  Tortuous vessels. Catheter induced spasm of the ostial RCA.  Mild radial vasospasm.  Continue preventive therapy.   ECHOCARDIOGRAM COMPLETE  Result Date: 07/02/2021    ECHOCARDIOGRAM REPORT   Patient Name:   Natalie Castro Date of Exam: 07/02/2021 Medical Rec #:  [id number]     Height:       65.0 in Accession #:    [id number]   Weight:       209.9 lb Date of Birth:  02-26-1941    BSA:          2.019 m Patient Age:    61 years     BP:           141/88 mmHg Patient Gender: F            HR:           80 bpm. Exam Location:  [facility] Procedure: 2D Echo, Cardiac Doppler and Color Doppler Indications:    Chest Pain  History:        Patient has prior history of Echocardiogram examinations, most                 recent 10/26/2018. Pacemaker; Risk Factors:Hypertension, Diabetes                 and Dyslipidemia.  Sonographer:    Wenda Low Referring Phys: [id number] [place]  1. Hypokinesis of the distal inferior/inferoseptal walls. Changes appear more prominent than in previous echo in 2017. Marland Kitchen Left ventricular ejection fraction, by estimation, is 55 to 60%. The left ventricle has normal function. 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 Elevated left atrial pressure.  2. Right ventricular systolic function is normal. The right ventricular size is normal. There is normal pulmonary artery systolic pressure.  3. The mitral valve is normal in structure. Mild to moderate mitral valve regurgitation.  4. The aortic valve is tricuspid. Aortic valve regurgitation is not visualized. Mild to moderate aortic valve sclerosis/calcification is present, without any evidence of aortic stenosis. FINDINGS  Left Ventricle: Hypokinesis of the distal inferior/inferoseptal walls. Changes appear more prominent than in previous echo in 2017. Left ventricular ejection fraction, by estimation, is 55 to 60%. The left ventricle has normal function. The left ventricular internal cavity size was normal in size. 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 Elevated left atrial pressure. Right Ventricle: The right ventricular size is normal. Right vetricular wall thickness was not assessed. Right ventricular systolic function is normal. There is normal pulmonary artery systolic pressure. The  tricuspid regurgitant velocity is 2.31 m/s, and with an assumed right atrial pressure of 3 mmHg, the estimated right ventricular systolic pressure is 25.3 mmHg. Left Atrium: Left atrial size was normal in size. Right Atrium: Right atrial size was normal in size. Pericardium: There is no evidence of pericardial effusion. Mitral Valve: The mitral valve is normal in structure. Mild to moderate mitral valve regurgitation.  MV peak gradient, 5.6 mmHg. The mean mitral valve gradient is 2.0 mmHg. Tricuspid Valve: The tricuspid valve is normal in structure. Tricuspid valve regurgitation is mild. Aortic Valve: The aortic valve is tricuspid. Aortic valve regurgitation is not visualized. Mild to moderate aortic valve sclerosis/calcification is present, without any evidence of aortic stenosis. Aortic valve mean gradient measures 4.0 mmHg. Aortic valve peak gradient measures 7.8 mmHg. Aortic valve area, by VTI measures 1.60 cm. Pulmonic Valve: The pulmonic valve was grossly normal. Pulmonic valve regurgitation is not visualized. Aorta: The aortic root and ascending aorta are structurally normal, with no evidence of dilitation. IAS/Shunts: No atrial level shunt detected by color flow Doppler. Additional Comments: A device lead is visualized.  LEFT VENTRICLE PLAX 2D LVIDd:         4.37 cm      Diastology LVIDs:         3.24 cm      LV e' medial:    4.47 cm/s LV PW:         1.62 cm      LV E/e' medial:  19.1 LV IVS:        1.56 cm      LV e' lateral:   8.11 cm/s LVOT diam:     2.00 cm      LV E/e' lateral: 10.5 LV SV:         52 LV SV Index:   26 LVOT Area:     3.14 cm  LV Volumes (MOD) LV vol d, MOD A2C: 122.0 ml LV vol d, MOD A4C: 138.0 ml LV vol s, MOD A2C: 53.1 ml LV vol s, MOD A4C: 66.8 ml LV SV MOD A2C:     68.9 ml LV SV MOD A4C:     138.0 ml LV SV MOD BP:      72.7 ml RIGHT VENTRICLE RV Basal diam:  3.50 cm RV Mid diam:    2.76 cm RV S prime:     8.19 cm/s TAPSE (M-mode): 2.5 cm LEFT ATRIUM             Index       RIGHT  ATRIUM           Index LA diam:        4.10 cm 2.03 cm/m  RA Area:     16.40 cm LA Vol (A2C):   55.3 ml 27.39 ml/m RA Volume:   43.00 ml  21.30 ml/m LA Vol (A4C):   58.4 ml 28.95 ml/m LA Biplane Vol: 61.6 ml 30.51 ml/m  AORTIC VALVE AV Area (Vmax):    1.66 cm AV Area (Vmean):   1.54 cm AV Area (VTI):     1.60 cm AV Vmax:           140.00 cm/s AV Vmean:          97.200 cm/s AV VTI:            0.322 m AV Peak Grad:      7.8 mmHg AV Mean Grad:      4.0 mmHg LVOT Vmax:         73.80 cm/s LVOT Vmean:        47.700 cm/s LVOT VTI:          0.164 m LVOT/AV VTI ratio: 0.51  AORTA Ao Root diam: 2.90 cm Ao Asc diam:  3.10 cm MITRAL VALVE                 TRICUSPID VALVE  MV Area (PHT): 2.71 cm      TR Peak grad:   21.3 mmHg MV Area VTI:   1.53 cm      TR Vmax:        231.00 cm/s MV Peak grad:  5.6 mmHg MV Mean grad:  2.0 mmHg      SHUNTS MV Vmax:       1.18 m/s      Systemic VTI:  0.16 m MV Vmean:      64.4 cm/s     Systemic Diam: 2.00 cm MV Decel Time: 280 msec MR Peak grad:    96.0 mmHg MR Mean grad:    47.0 mmHg MR Vmax:         490.00 cm/s MR Vmean:        291.0 cm/s MR PISA:         1.57 cm MR PISA Eff ROA: 30 mm MR PISA Radius:  0.50 cm MV E velocity: 85.30 cm/s MV A velocity: 91.30 cm/s MV E/A ratio:  0.93 Dorris Carnes MD Electronically signed by Dorris Carnes MD Signature Date/Time: 07/02/2021/6:37:08 PM    Final     Medications:    atorvastatin  40 mg Oral Daily   hydrALAZINE  50 mg Oral BID   insulin aspart  0-15 Units Subcutaneous TID WC   insulin glargine  15 Units Subcutaneous QHS   losartan  100 mg Oral Daily   metoprolol succinate  25 mg Oral Daily   sodium chloride flush  3 mL Intravenous Q12H   Continuous Infusions:  sodium chloride 100 mL/hr at 07/03/21 0348   sodium chloride       LOS: 2 days   Nashae Maudlin A Prisha Hiley  [facility]   *Please refer to [email or web address], password [email or web address] to get updated schedule on who will round on this patient, as hospitalists switch teams weekly. If 7PM-7AM,  please contact night-coverage at [email or web address],

## 2021-07-04 NOTE — Progress Notes (Signed)
Date and time results received: 07/04/21 1325 (use smartphrase ".now" to insert current time)  Test: LA Critical Value: 2.3  Name of Provider Notified: Virl Axe  Orders Received: Awaiting

## 2021-07-04 NOTE — Progress Notes (Signed)
Notified bedside nurse of need to administer antibiotics.   Spoke with bedside RN to make sure abx were going to be given soon, pt has only 1 piv with current medication infusing then abx will be started, also encouraged bedside RN to call elink if they had any questions or needed assistance with sepsis. Elink will cont. To monitor sepsis patient for a duration of 6 hrs

## 2021-07-04 NOTE — Progress Notes (Signed)
Progress Note  Patient Name: Natalie Castro Date of Encounter: 07/04/2021  Primary Cardiologist: Cristopher Peru, MD     Patient Profile     80 y.o. 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 Echo  EF 55-60 with HK inferolateral walls  ECG Sinus with prolonged AV timing related to pacemaker-VIP T 101.5 Subjective  No SOB  Nausea persisting no vomiting  Inpatient Medications    Scheduled Meds:  aspirin EC  81 mg Oral Daily   atorvastatin  40 mg Oral Daily   hydrALAZINE  25 mg Oral BID   insulin aspart  0-15 Units Subcutaneous TID WC   insulin glargine  15 Units Subcutaneous QHS   losartan  100 mg Oral Daily   metoprolol succinate  25 mg Oral Daily   sodium chloride flush  3 mL Intravenous Q12H   Continuous Infusions:  sodium chloride 100 mL/hr at 07/03/21 0348   sodium chloride     PRN Meds: sodium chloride, acetaminophen **OR** acetaminophen, morphine injection, nitroGLYCERIN, ondansetron **OR** ondansetron (ZOFRAN) IV, prochlorperazine, sodium chloride flush   Vital Signs    Vitals:   07/03/21 1959 07/04/21 0415 07/04/21 0538 07/04/21 0800  BP: (!) 168/83 (!) 145/57  (!) 161/76  Pulse: 83 99  96  Resp: 20 20 20  (!) 24  Temp: 98.2 F (36.8 C) (!) 101.5 F (38.6 C) 100.2 F (37.9 C) 98.2 F (36.8 C)  TempSrc: Oral Oral Oral Oral  SpO2: 98% 98%    Weight:      Height:        Intake/Output Summary (Last 24 hours) at 07/04/2021 0957 Last data filed at 07/04/2021 0300 Gross per 24 hour  Intake 240 ml  Output 900 ml  Net -660 ml    Filed Weights   07/01/21 2245 07/02/21 0238  Weight: 96.2 kg 95.2 kg    Telemetry    NSR - Personally Reviewed  ECG     - Personally  Reviewed  Physical Exam    Well developed and Morbidly obese  in no acute distress HENT normal Neck supple with JVP-  Clear Regular rate and rhythm, no murmurs or gallops Abd-soft with active BS +RUQ tenderness  No Clubbing cyanosis edema Skin-warm and dry A & Oriented  Grossly normal sensory and motor function     Labs    Chemistry Recent Labs  Lab 07/01/21 2247  NA 139  K 3.9  CL 108  CO2 26  GLUCOSE 208*  BUN 13  CREATININE 0.72  CALCIUM 8.6*  PROT 7.3  ALBUMIN 3.2*  AST 12*  ALT 10  ALKPHOS 73  BILITOT 0.3  GFRNONAA >60  ANIONGAP 5      Hematology Recent Labs  Lab 07/01/21 2247  WBC 8.1  RBC 4.08  HGB 12.4  HCT 39.7  MCV 97.3  MCH 30.4  MCHC 31.2  RDW 14.7  PLT 173     Cardiac EnzymesNo results for input(s): TROPONINI in the last 168 hours. No results for input(s): TROPIPOC in the last 168 hours.   BNPNo results for input(s): BNP, PROBNP in the last 168 hours.   DDimer No results for input(s): DDIMER in the last 168 hours.  Radiology    CARDIAC CATHETERIZATION  Addendum Date: 07/02/2021    The left ventricular systolic function is normal.  LV end diastolic pressure is mildly elevated. LVEDP 17 mm Hg.  The left ventricular ejection fraction is 55-65% by visual estimate.  There is no aortic valve stenosis.  Mild nonobstructive coroanry artery disease.  Tortuous vessels. Catheter induced spasm of the ostial RCA.  Mild radial vasospasm.  Continue preventive therapy.  Low level troponin elevation may be related to her rhythm disturbance noted on ECG.    Addendum Date: 07/02/2021    The left ventricular systolic function is normal.  LV end diastolic pressure is mildly elevated. LVEDP 17 mm Hg.  The left ventricular ejection fraction is 55-65% by visual estimate.  There is no aortic valve stenosis.  Mild nonobstructive coroanry artery disease.  Tortuous vessels. Catheter induced spasm of the ostial RCA.  Mild radial vasospasm.  Continue  preventive therapy.   Result Date: 07/02/2021  The left ventricular systolic function is normal.  LV end diastolic pressure is mildly elevated. LVEDP 17 mm Hg.  The left ventricular ejection fraction is 55-65% by visual estimate.  There is no aortic valve stenosis.  Mild nonobstructive coroanry artery disease.  Tortuous vessels. Catheter induced spasm of the ostial RCA.  Mild radial vasospasm.  Continue preventive therapy.   ECHOCARDIOGRAM COMPLETE  Result Date: 07/02/2021    ECHOCARDIOGRAM REPORT   Patient Name:   Natalie Castro Date of Exam: 07/02/2021 Medical Rec #:  [id number]    Height:       65.0 in Accession #:    [id number]   Weight:       209.9 lb Date of Birth:  [birth year]    BSA:          2.019 m Patient Age:    14 years     BP:           141/88 mmHg Patient Gender: F            HR:           80 bpm. Exam Location:  [facility] Procedure: 2D Echo, Cardiac Doppler and Color Doppler Indications:    Chest Pain  History:        Patient has prior history of Echocardiogram examinations, most                 recent 10/26/2018. Pacemaker; Risk Factors:Hypertension, Diabetes                 and Dyslipidemia.  Sonographer:    Wenda Low Referring Phys: [id number] [place]  1. Hypokinesis of the distal inferior/inferoseptal walls. Changes appear more prominent than in previous echo in 2017. Marland Kitchen Left ventricular ejection fraction, by estimation, is 55 to 60%. The left ventricle has normal function. 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 Elevated left atrial pressure.  2. Right ventricular systolic function is normal. The right ventricular size is normal. There is normal pulmonary artery systolic pressure.  3. The mitral valve is normal in structure. Mild to moderate mitral valve regurgitation.  4. The aortic valve is tricuspid. Aortic valve regurgitation is not visualized. Mild to moderate aortic valve sclerosis/calcification is present, without any evidence of aortic stenosis.  FINDINGS  Left Ventricle: Hypokinesis of the distal inferior/inferoseptal walls. Changes appear more prominent than in previous echo in 2017. Left ventricular ejection fraction, by estimation, is 55 to 60%. The left ventricle has normal function. The left ventricular internal cavity size was  normal in size. 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 Elevated left atrial pressure. Right Ventricle: The right ventricular size is normal. Right vetricular wall thickness was not assessed. Right ventricular systolic function is normal. There is normal pulmonary artery systolic pressure. The tricuspid regurgitant velocity is 2.31 m/s, and with an assumed right atrial pressure of 3 mmHg, the estimated right ventricular systolic pressure is 29.5 mmHg. Left Atrium: Left atrial size was normal in size. Right Atrium: Right atrial size was normal in size. Pericardium: There is no evidence of pericardial effusion. Mitral Valve: The mitral valve is normal in structure. Mild to moderate mitral valve regurgitation. MV peak gradient, 5.6 mmHg. The mean mitral valve gradient is 2.0 mmHg. Tricuspid Valve: The tricuspid valve is normal in structure. Tricuspid valve regurgitation is mild. Aortic Valve: The aortic valve is tricuspid. Aortic valve regurgitation is not visualized. Mild to moderate aortic valve sclerosis/calcification is present, without any evidence of aortic stenosis. Aortic valve mean gradient measures 4.0 mmHg. Aortic valve peak gradient measures 7.8 mmHg. Aortic valve area, by VTI measures 1.60 cm. Pulmonic Valve: The pulmonic valve was grossly normal. Pulmonic valve regurgitation is not visualized. Aorta: The aortic root and ascending aorta are structurally normal, with no evidence of dilitation. IAS/Shunts: No atrial level shunt detected by color flow Doppler. Additional Comments: A device lead is visualized.  LEFT VENTRICLE PLAX 2D LVIDd:         4.37 cm      Diastology LVIDs:         3.24 cm      LV e' medial:     4.47 cm/s LV PW:         1.62 cm      LV E/e' medial:  19.1 LV IVS:        1.56 cm      LV e' lateral:   8.11 cm/s LVOT diam:     2.00 cm      LV E/e' lateral: 10.5 LV SV:         52 LV SV Index:   26 LVOT Area:     3.14 cm  LV Volumes (MOD) LV vol d, MOD A2C: 122.0 ml LV vol d, MOD A4C: 138.0 ml LV vol s, MOD A2C: 53.1 ml LV vol s, MOD A4C: 66.8 ml LV SV MOD A2C:     68.9 ml LV SV MOD A4C:     138.0 ml LV SV MOD BP:      72.7 ml RIGHT VENTRICLE RV Basal diam:  3.50 cm RV Mid diam:    2.76 cm RV S prime:     8.19 cm/s TAPSE (M-mode): 2.5 cm LEFT ATRIUM             Index       RIGHT ATRIUM           Index LA diam:        4.10 cm 2.03 cm/m  RA Area:     16.40 cm LA Vol (A2C):   55.3 ml 27.39 ml/m RA Volume:   43.00 ml  21.30 ml/m LA Vol (A4C):   58.4 ml 28.95 ml/m LA Biplane Vol: 61.6 ml 30.51 ml/m  AORTIC VALVE AV Area (Vmax):    1.66 cm AV Area (Vmean):   1.54 cm AV Area (VTI):     1.60 cm AV Vmax:           140.00 cm/s AV Vmean:          97.200 cm/s  AV VTI:            0.322 m AV Peak Grad:      7.8 mmHg AV Mean Grad:      4.0 mmHg LVOT Vmax:         73.80 cm/s LVOT Vmean:        47.700 cm/s LVOT VTI:          0.164 m LVOT/AV VTI ratio: 0.51  AORTA Ao Root diam: 2.90 cm Ao Asc diam:  3.10 cm MITRAL VALVE                 TRICUSPID VALVE MV Area (PHT): 2.71 cm      TR Peak grad:   21.3 mmHg MV Area VTI:   1.53 cm      TR Vmax:        231.00 cm/s MV Peak grad:  5.6 mmHg MV Mean grad:  2.0 mmHg      SHUNTS MV Vmax:       1.18 m/s      Systemic VTI:  0.16 m MV Vmean:      64.4 cm/s     Systemic Diam: 2.00 cm MV Decel Time: 280 msec MR Peak grad:    96.0 mmHg MR Mean grad:    47.0 mmHg MR Vmax:         490.00 cm/s MR Vmean:        291.0 cm/s MR PISA:         1.57 cm MR PISA Eff ROA: 30 mm MR PISA Radius:  0.50 cm MV E velocity: 85.30 cm/s MV A velocity: 91.30 cm/s MV E/A ratio:  0.93 Dorris Carnes MD Electronically signed by Dorris Carnes MD Signature Date/Time: 07/02/2021/6:37:08 PM    Final     Cardiac Studies   (As above)  Assessment & Plan   Chest pain recurrent-noncardiac  Pulmonary embolism on anticoagulation  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      For questions or updates, please contact [place] Please consult [email or web address] for contact info under Cardiology/STEMI.      Signed, Virl Axe, MD  07/04/2021, 9:57 AM

## 2021-07-04 NOTE — Progress Notes (Signed)
[facility] following sepsis 

## 2021-07-05 ENCOUNTER — Encounter (HOSPITAL_COMMUNITY): Payer: Self-pay | Admitting: Interventional Cardiology

## 2021-07-05 DIAGNOSIS — R7881 Bacteremia: Secondary | ICD-10-CM | POA: Diagnosis not present

## 2021-07-05 DIAGNOSIS — B9620 Unspecified Escherichia coli [E. coli] as the cause of diseases classified elsewhere: Secondary | ICD-10-CM | POA: Diagnosis not present

## 2021-07-05 DIAGNOSIS — R509 Fever, unspecified: Secondary | ICD-10-CM

## 2021-07-05 DIAGNOSIS — R109 Unspecified abdominal pain: Secondary | ICD-10-CM | POA: Diagnosis not present

## 2021-07-05 DIAGNOSIS — N179 Acute kidney failure, unspecified: Secondary | ICD-10-CM | POA: Diagnosis not present

## 2021-07-05 DIAGNOSIS — R6520 Severe sepsis without septic shock: Secondary | ICD-10-CM

## 2021-07-05 DIAGNOSIS — R079 Chest pain, unspecified: Secondary | ICD-10-CM | POA: Diagnosis not present

## 2021-07-05 DIAGNOSIS — E1165 Type 2 diabetes mellitus with hyperglycemia: Secondary | ICD-10-CM

## 2021-07-05 DIAGNOSIS — A4151 Sepsis due to Escherichia coli [E. coli]: Secondary | ICD-10-CM | POA: Diagnosis not present

## 2021-07-05 DIAGNOSIS — K8689 Other specified diseases of pancreas: Secondary | ICD-10-CM | POA: Diagnosis not present

## 2021-07-05 DIAGNOSIS — R778 Other specified abnormalities of plasma proteins: Secondary | ICD-10-CM | POA: Diagnosis not present

## 2021-07-05 DIAGNOSIS — A419 Sepsis, unspecified organism: Secondary | ICD-10-CM | POA: Diagnosis present

## 2021-07-05 LAB — HEPATIC FUNCTION PANEL
ALT: 10 U/L (ref 0–44)
AST: 14 U/L — ABNORMAL LOW (ref 15–41)
Albumin: 2.3 g/dL — ABNORMAL LOW (ref 3.5–5.0)
Alkaline Phosphatase: 52 U/L (ref 38–126)
Bilirubin, Direct: 0.3 mg/dL — ABNORMAL HIGH (ref 0.0–0.2)
Indirect Bilirubin: 0.9 mg/dL (ref 0.3–0.9)
Total Bilirubin: 1.2 mg/dL (ref 0.3–1.2)
Total Protein: 6.4 g/dL — ABNORMAL LOW (ref 6.5–8.1)

## 2021-07-05 LAB — URINALYSIS, ROUTINE W REFLEX MICROSCOPIC
Bilirubin Urine: NEGATIVE
Glucose, UA: 150 mg/dL — AB
Ketones, ur: NEGATIVE mg/dL
Leukocytes,Ua: NEGATIVE
Nitrite: NEGATIVE
Protein, ur: 30 mg/dL — AB
Specific Gravity, Urine: 1.025 (ref 1.005–1.030)
pH: 5 (ref 5.0–8.0)

## 2021-07-05 LAB — BASIC METABOLIC PANEL
Anion gap: 9 (ref 5–15)
BUN: 24 mg/dL — ABNORMAL HIGH (ref 8–23)
CO2: 22 mmol/L (ref 22–32)
Calcium: 8.3 mg/dL — ABNORMAL LOW (ref 8.9–10.3)
Chloride: 103 mmol/L (ref 98–111)
Creatinine, Ser: 1.34 mg/dL — ABNORMAL HIGH (ref 0.44–1.00)
GFR, Estimated: 40 mL/min — ABNORMAL LOW (ref >60)
Glucose, Bld: 201 mg/dL — ABNORMAL HIGH (ref 70–99)
Potassium: 3.9 mmol/L (ref 3.5–5.1)
Sodium: 134 mmol/L — ABNORMAL LOW (ref 135–145)

## 2021-07-05 LAB — GLUCOSE, CAPILLARY
Glucose-Capillary: 204 mg/dL — ABNORMAL HIGH (ref 70–99)
Glucose-Capillary: 184 mg/dL — ABNORMAL HIGH (ref 70–99)
Glucose-Capillary: 150 mg/dL — ABNORMAL HIGH (ref 70–99)
Glucose-Capillary: 135 mg/dL — ABNORMAL HIGH (ref 70–99)

## 2021-07-05 LAB — PROCALCITONIN: Procalcitonin: 3.47 ng/mL

## 2021-07-05 LAB — BLOOD CULTURE ID PANEL (REFLEXED) - BCID2

## 2021-07-05 LAB — LACTIC ACID, PLASMA
Lactic Acid, Venous: 1.3 mmol/L (ref 0.5–1.9)
Lactic Acid, Venous: 1.3 mmol/L (ref 0.5–1.9)

## 2021-07-05 LAB — CBC
HCT: 36.3 % (ref 36.0–46.0)
Hemoglobin: 11.9 g/dL — ABNORMAL LOW (ref 12.0–15.0)
MCH: 30.9 pg (ref 26.0–34.0)
MCHC: 32.8 g/dL (ref 30.0–36.0)
MCV: 94.3 fL (ref 80.0–100.0)
Platelets: 158 10*3/uL (ref 150–400)
RBC: 3.85 MIL/uL — ABNORMAL LOW (ref 3.87–5.11)
RDW: 14.9 % (ref 11.5–15.5)
WBC: 25.8 10*3/uL — ABNORMAL HIGH (ref 4.0–10.5)
nRBC: 0 % (ref 0.0–0.2)

## 2021-07-05 LAB — MAGNESIUM: Magnesium: 1.9 mg/dL (ref 1.7–2.4)

## 2021-07-05 MED ORDER — INSULIN GLARGINE 100 UNIT/ML ~~LOC~~ SOLN
20.0000 [IU] | Freq: Every day | SUBCUTANEOUS | Status: DC
  Administered 2021-07-05 – 2021-07-10 (×6): 20 [IU] via SUBCUTANEOUS
  Filled 2021-07-05 (×7): qty 0.2

## 2021-07-05 MED ORDER — METRONIDAZOLE 500 MG PO TABS
500.0000 mg | ORAL_TABLET | Freq: Two times a day (BID) | ORAL | Status: DC
  Administered 2021-07-05 – 2021-07-11 (×12): 500 mg via ORAL
  Filled 2021-07-05 (×12): qty 1

## 2021-07-05 MED ORDER — SODIUM CHLORIDE 0.9 % IV SOLN
2.0000 g | INTRAVENOUS | Status: DC
  Administered 2021-07-05 – 2021-07-06 (×2): 2 g via INTRAVENOUS
  Filled 2021-07-05: qty 20
  Filled 2021-07-05: qty 2
  Filled 2021-07-05: qty 20

## 2021-07-05 MED FILL — Heparin Sod (Porcine)-NaCl IV Soln 1000 Unit/500ML-0.9%: INTRAVENOUS | Qty: 500 | Status: AC

## 2021-07-05 NOTE — Progress Notes (Addendum)
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  Echocardiogram with LVEF 55-60% with hypokinesis of the distal inferior/inferior septal walls.  On 07/04/2021, patient was noted to have a fever up to 101.3 F with tachypnea.  TRH was consulted on 7/10 for further evaluation and work-up of fever with SIRS symptomatology.   Assessment & Plan:   Principal Problem:   Septicemia due to E. coli ([place]) Active Problems:   Essential hypertension, benign   Saddle pulmonary embolus (HCC)   Elevated troponin I level   Uncontrolled type 2 diabetes mellitus with hyperglycemia (HCC)   Acute coronary syndrome (HCC)   Protein-calorie malnutrition, mild (HCC)   Chest pain   Sepsis ([place])   Bacteremia   E. coli septicemia, not POA On 07/04/2021, patient developed fever up to 101.3 F with associated tachypnea.  Patient with reported history of nausea/vomiting on presentation with associated chest pain.  Queried possibility of aspiration pneumonia.  Chest x-ray with no acute cardiopulmonary disease process.  Lipase 23, within normal limits.  Review of CT angiogram chest/abdomen/pelvis performed on 07/02/2021 at time of admission noted mild gallbladder distention and dilation of the pancreatic duct.   Abdominal x-ray 7/10 with nonobstructive bowel gas pattern with recommendation of right upper quadrant ultrasound to exclude acute cholecystitis.  Right upper quadrant ultrasound 7/10 with gallbladder distention without gallstones or wall thickening and no sonographic Murphy sign noted by sonographer with CBD 4 mm.  Blood cultures x2 were ordered with 1 returning positive (anaerobic bottle) for E. coli.  Unclear etiology, urinalysis not collected prior to initiation of antibiotics, likely urinary versus gallbladder source.  LFTs and total bilirubin within normal limits. --WBC 8.1>18.7>25.8 --RN instructed to obtain urinalysis with urine culture via In-N-Out catheterization today --ID consulted per cardiology this morning --Ceftriaxone 2 g IV every 24 hours --Continue monitor fever curve, follow-up further blood culture identification susceptibilities --Monitor on telemetry --Serial abdominal exams --[facility] consulted for evaluation of pancreatic ductal dilation as ID believes source of infection biliary and unable to perform MRCP due to noncompatible PPM --CBC daily  Atypical chest pain Nonobstructive CAD Patient initially presenting to [facility] with chest pain, transferred to [employer] left heart catheterization with minimal nonobstructive CAD.  TTE with normal LVEF. --Atorvastatin 40 mg p.o. daily --Metoprolol succinate 25 mg p.o. daily --Home aspirin discontinued by cardiology, currently on treatment dose Lovenox until further infectious work-up complete and then will resume home Eliquis  Essential hypertension BP 118/58 this morning, well controlled.  Home regimen includes metoprolol succinate 25 mg p.o. daily, nifedipine 30 mg p.o. daily, losartan 100 mg p.o. daily, hydralazine 50 mg p.o. twice daily. --Metoprolol succinate 25 mg p.o. daily --BP 118/58 this morning, hold remaining antihypertensives including nifedipine, losartan, hydralazine --Continue  monitor BP closely  Type 2 diabetes mellitus Home regimen includes metformin 1000 mg p.o. twice daily, Lantus 30  units subcutaneously nightly.  Hemoglobin A1c 7.478 2022, well controlled. 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  Device interrogated with prolonged AV timing, EP, Dr. [name] recommended reprogramming as outpatient.  Hx of PE 2017, on chronic anticoagulation On Eliquis outpatient.  Currently holding Eliquis in favor of treatment dose Lovenox in case surgical needs arise regarding gallbladder as etiology of her septicemia.  Hypomagnesemia Repleted with IV magnesium. --repeat electrolytes in a.m.   DVT prophylaxis:   Lovenox   Code Status: Full Code Family Communication: Patient's daughter present at bedside this morning  Disposition Plan:  Level of care: Telemetry Cardiac Status is: Inpatient  Remains inpatient appropriate because:Ongoing diagnostic testing needed not appropriate for outpatient work up, Unsafe d/c plan, IV treatments appropriate due to intensity of illness or inability to take PO, and Inpatient level of care appropriate due to severity of illness  Dispo: The patient is from: Home              Anticipated d/c is to: Home              Patient currently is not medically stable to d/c.   Difficult to place patient No  Consultants:  Cardiology Infectious disease, Dr. Juleen China  Procedures:  Left heart catheterization  Antimicrobials:  Ceftriaxone 7/10>> Metronidazole 7/10 - 7/11    Subjective: Patient seen examined bedside, resting comfortably.  States lower quadrant abdominal pain that she had yesterday has resolved.  Daughter present at bedside.  White count increased overnight to 25.8.  Fever curve improved to 99.7 this morning.  Patient's main complaint is wanting something to eat.   Unfortunately, no urinalysis was obtained prior to initiation of antibiotics yesterday.  Blood culture 1 out of 4 positive for E. coli.  Currently on ceftriaxone.  No other questions or concerns at this time.  Denies headache, no dizziness, no chest pain, no shortness of breath, no current abdominal pain, no weakness, no fatigue, no paresthesias.  No acute events overnight per nursing staff.  Discussed with cardiology this a.m., they stated to have consulted ID for further evaluation and wish for TRH to assume care.  Cardiology signing off.  Objective: Vitals:   07/05/21 0510 07/05/21 0616 07/05/21 0842 07/05/21 1015  BP:   (!) 108/51 (!) 113/51  Pulse:   89 85  Resp:   (!) 23 (!) 25  Temp: (!) 101.2 F (38.4 C) 99.7 F (37.6 C) 98.7 F (37.1 C) 98.8 F (37.1 C)  TempSrc:  Oral Oral Oral  SpO2:   95%   Weight:      Height:        Intake/Output Summary (Last 24 hours) at 07/05/2021 1300 Last data filed at 07/05/2021 1220 Gross per 24 hour  Intake 363.05 ml  Output 450 ml  Net -86.95 ml   Filed Weights   07/01/21 2245 07/02/21 0238  Weight: 96.2 kg 95.2 kg    Examination:  General exam: Appears calm and comfortable  Respiratory system: Clear to auscultation. Respiratory effort normal.  On room air Cardiovascular system: S1 & S2 heard, RRR. No JVD, murmurs, rubs, gallops or clicks. No pedal edema. Gastrointestinal system: Abdomen is nondistended, soft, mild tenderness right upper/right lower quadrant, otherwise benign. No organomegaly or masses felt. Normal bowel sounds heard. Central nervous system: Alert and oriented. No focal neurological deficits. Extremities: Symmetric 5 x 5 power. Skin:  No rashes, lesions or ulcers Psychiatry: Judgement and insight appear normal. Mood & affect appropriate.     Data Reviewed: I have personally reviewed following labs and imaging studies  CBC: Recent Labs  Lab 07/01/21 2247 07/04/21 1028 07/05/21 0633  WBC 8.1 18.7* 25.8*   NEUTROABS  --  16.0*  --   HGB 12.4 12.2 11.9*  HCT 39.7 37.3 36.3  MCV 97.3 93.5 94.3  PLT 173 153 102   Basic Metabolic Panel: Recent Labs  Lab 07/01/21 2247 07/02/21 1102 07/04/21 1028 07/05/21 0253 07/05/21 0633  NA 139  --  134*  --  134*  K 3.9  --  3.6  --  3.9  CL 108  --  104  --  103  CO2 26  --  22  --  22  GLUCOSE 208*  --  184*  --  201*  BUN 13  --  11  --  24*  CREATININE 0.72  --  0.95  --  1.34*  CALCIUM 8.6*  --  8.6*  --  8.3*  MG  --  1.6* 1.5* 1.9  --    GFR: Estimated Creatinine Clearance: 38.9 mL/min (A) (by C-G formula based on SCr of 1.34 mg/dL (H)). Liver Function Tests: Recent Labs  Lab 07/01/21 2247 07/05/21 0633  AST 12* 14*  ALT 10 10  ALKPHOS 73 52  BILITOT 0.3 1.2  PROT 7.3 6.4*  ALBUMIN 3.2* 2.3*   Recent Labs  Lab 07/01/21 2247 07/04/21 1145  LIPASE 37 23   No results for input(s): AMMONIA in the last 168 hours. Coagulation Profile: No results for input(s): INR, PROTIME in the last 168 hours. Cardiac Enzymes: No results for input(s): CKTOTAL, CKMB, CKMBINDEX, TROPONINI in the last 168 hours. BNP (last 3 results) No results for input(s): PROBNP in the last 8760 hours. HbA1C: No results for input(s): HGBA1C in the last 72 hours. CBG: Recent Labs  Lab 07/04/21 1222 07/04/21 1639 07/04/21 2111 07/05/21 0744 07/05/21 1245  GLUCAP 167* 175* 182* 204* 184*   Lipid Profile: No results for input(s): CHOL, HDL, LDLCALC, TRIG, CHOLHDL, LDLDIRECT in the last 72 hours. Thyroid Function Tests: No results for input(s): TSH, T4TOTAL, FREET4, T3FREE, THYROIDAB in the last 72 hours. Anemia Panel: No results for input(s): VITAMINB12, FOLATE, FERRITIN, TIBC, IRON, RETICCTPCT in the last 72 hours. Sepsis Labs: Recent Labs  Lab 07/04/21 1145 07/04/21 1329  LATICACIDVEN 2.3* 1.3    Recent Results (from the past 240 hour(s))  Resp Panel by RT-PCR (Flu A&B, Covid) Nasopharyngeal Swab     Status: None   Collection Time: 07/02/21   1:45 AM   Specimen: Nasopharyngeal Swab; Nasopharyngeal(NP) swabs in vial transport medium  Result Value Ref Range Status   SARS Coronavirus 2 by RT PCR NEGATIVE NEGATIVE Final    Comment: (NOTE) SARS-CoV-2 target nucleic acids are NOT DETECTED.  The SARS-CoV-2 RNA is generally detectable in upper respiratory specimens during the acute phase of infection. The lowest concentration of SARS-CoV-2 viral copies this assay can detect is 138 copies/mL. A negative result does not preclude SARS-Cov-2 infection and should not be used as the sole basis for treatment or other patient management decisions. A negative result may occur with  improper specimen collection/handling, submission of specimen other than nasopharyngeal swab, presence of viral mutation(s) within the areas targeted by this assay, and inadequate number of viral copies(<138 copies/mL). A negative result must be combined with clinical observations, patient history, and epidemiological information. The expected result is  Negative.  Fact Sheet for Patients:  EntrepreneurPulse.com.au  Fact Sheet for Healthcare Providers:  IncredibleEmployment.be  This test is no t yet approved or cleared by the [place] FDA and  has been authorized for detection and/or diagnosis of SARS-CoV-2 by FDA under an Emergency Use Authorization (EUA). This EUA will remain  in effect (meaning this test can be used) for the duration of the COVID-19 declaration under Section 564(b)(1) of the Act, 21 U.S.C.section 360bbb-3(b)(1), unless the authorization is terminated  or revoked sooner.       Influenza A by PCR NEGATIVE NEGATIVE Final   Influenza B by PCR NEGATIVE NEGATIVE Final    Comment: (NOTE) The Xpert Xpress SARS-CoV-2/FLU/RSV plus assay is intended as an aid in the diagnosis of influenza from Nasopharyngeal swab specimens and should not be used as a sole basis for treatment. Nasal washings and aspirates  are unacceptable for Xpert Xpress SARS-CoV-2/FLU/RSV testing.  Fact Sheet for Patients: EntrepreneurPulse.com.au  Fact Sheet for Healthcare Providers: IncredibleEmployment.be  This test is not yet approved or cleared by the [place] FDA and has been authorized for detection and/or diagnosis of SARS-CoV-2 by FDA under an Emergency Use Authorization (EUA). This EUA will remain in effect (meaning this test can be used) for the duration of the COVID-19 declaration under Section 564(b)(1) of the Act, 21 U.S.C. section 360bbb-3(b)(1), unless the authorization is terminated or revoked.  Performed at [facility], [address] [place], [place] [address]   Culture, blood (routine x 2)     Status: None (Preliminary result)   Collection Time: 07/04/21 10:28 AM   Specimen: BLOOD RIGHT HAND  Result Value Ref Range Status   Specimen Description BLOOD RIGHT HAND  Final   Special Requests   Final    BOTTLES DRAWN AEROBIC ONLY Blood Culture results may not be optimal due to an inadequate volume of blood received in culture bottles   Culture   Final    NO GROWTH < 24 HOURS Performed at [facility] [address], [place], [place] [address]    Report Status PENDING  Incomplete  Culture, blood (routine x 2)     Status: None (Preliminary result)   Collection Time: 07/04/21 10:28 AM   Specimen: BLOOD LEFT HAND  Result Value Ref Range Status   Specimen Description BLOOD LEFT HAND  Final   Special Requests   Final    BOTTLES DRAWN AEROBIC ONLY Blood Culture adequate volume   Culture  Setup Time   Final    GRAM NEGATIVE RODS AEROBIC BOTTLE ONLY CRITICAL RESULT CALLED TO, READ BACK BY AND VERIFIED WITH: [place] BY [place]. AT [id number] ON 7 11 2022 Performed at [facility], [address], [place],  [address]    Culture GRAM NEGATIVE RODS  Final   Report Status PENDING  Incomplete  Blood Culture ID Panel (Reflexed)      Status: Abnormal   Collection Time: 07/04/21 10:28 AM  Result Value Ref Range Status   Enterococcus faecalis NOT DETECTED NOT DETECTED Final   Enterococcus Faecium NOT DETECTED NOT DETECTED Final   Listeria monocytogenes NOT DETECTED NOT DETECTED Final   Staphylococcus species NOT DETECTED NOT DETECTED Final   Staphylococcus aureus (BCID) NOT DETECTED NOT DETECTED Final   Staphylococcus epidermidis NOT DETECTED NOT DETECTED Final   Staphylococcus lugdunensis NOT DETECTED NOT DETECTED Final   Streptococcus species NOT DETECTED NOT DETECTED Final   Streptococcus agalactiae NOT DETECTED NOT DETECTED Final  Streptococcus pneumoniae NOT DETECTED NOT DETECTED Final   Streptococcus pyogenes NOT DETECTED NOT DETECTED Final   A.calcoaceticus-baumannii NOT DETECTED NOT DETECTED Final   Bacteroides fragilis NOT DETECTED NOT DETECTED Final   Enterobacterales DETECTED (A) NOT DETECTED Final    Comment: Enterobacterales represent a large order of gram negative bacteria, not a single organism. CRITICAL RESULT CALLED TO, READ BACK BY AND VERIFIED WITH: PHARMD MIRANDA BRYK BY MESSAN H. AT [id number] ON 7 11 2022    Enterobacter cloacae complex NOT DETECTED NOT DETECTED Final   Escherichia coli DETECTED (A) NOT DETECTED Final    Comment: CRITICAL RESULT CALLED TO, READ BACK BY AND VERIFIED WITH: PHARMD MIRANDA BRYK BY MESSAN H. AT [phone] ON 7 11 2022    Klebsiella aerogenes NOT DETECTED NOT DETECTED Final   Klebsiella oxytoca NOT DETECTED NOT DETECTED Final   Klebsiella pneumoniae NOT DETECTED NOT DETECTED Final   Proteus species NOT DETECTED NOT DETECTED Final   Salmonella species NOT DETECTED NOT DETECTED Final   Serratia marcescens NOT DETECTED NOT DETECTED Final   Haemophilus influenzae NOT DETECTED NOT DETECTED Final   Neisseria meningitidis NOT DETECTED NOT DETECTED Final   Pseudomonas aeruginosa NOT DETECTED NOT DETECTED Final   Stenotrophomonas maltophilia NOT DETECTED NOT DETECTED Final   Candida  albicans NOT DETECTED NOT DETECTED Final   Candida auris NOT DETECTED NOT DETECTED Final   Candida glabrata NOT DETECTED NOT DETECTED Final   Candida krusei NOT DETECTED NOT DETECTED Final   Candida parapsilosis NOT DETECTED NOT DETECTED Final   Candida tropicalis NOT DETECTED NOT DETECTED Final   Cryptococcus neoformans/gattii NOT DETECTED NOT DETECTED Final   CTX-M ESBL NOT DETECTED NOT DETECTED Final   Carbapenem resistance IMP NOT DETECTED NOT DETECTED Final   Carbapenem resistance KPC NOT DETECTED NOT DETECTED Final   Carbapenem resistance NDM NOT DETECTED NOT DETECTED Final   Carbapenem resist OXA 48 LIKE NOT DETECTED NOT DETECTED Final   Carbapenem resistance VIM NOT DETECTED NOT DETECTED Final    Comment: Performed at [facility], [address], [place], [place] [address]         Radiology Studies: DG Abd 1 View  Result Date: 07/04/2021 CLINICAL DATA:  Fever and abdominal pain. EXAM: ABDOMEN - 1 VIEW COMPARISON:  CT abdomen dated 07/02/2021. FINDINGS: Visualized bowel gas pattern is nonobstructive. No evidence of abnormal fluid collection. No evidence of renal or ureteral calculi. Visualized osseous structures are unremarkable. IMPRESSION: 1. Nonobstructive bowel gas pattern. 2. Recommend RIGHT upper quadrant ultrasound to exclude acute cholecystitis as a possible cause for patient's fever and abdominal pain. These results will be called to the ordering clinician or representative by the Radiologist Assistant, and communication documented in the PACS or [employer]. Electronically Signed   By: Franki Cabot M.D.   On: 07/04/2021 10:48   DG CHEST PORT 1 VIEW  Result Date: 07/04/2021 CLINICAL DATA:  Acute coronary syndrome, fever, abd pain EXAM: PORTABLE CHEST 1 VIEW COMPARISON:  Chest x-ray dated 07/01/2021. FINDINGS: Heart size and mediastinal contours are stable. Lungs are clear. No pleural effusion or pneumothorax is seen. No acute-appearing osseous abnormality. LEFT  chest wall pacemaker/ICD apparatus in place. IMPRESSION: No active disease. No evidence of pneumonia or pulmonary edema. Electronically Signed   By: Franki Cabot M.D.   On: 07/04/2021 10:45   US Abdomen Limited RUQ (LIVER/GB)  Result Date: 07/04/2021 CLINICAL DATA:  Abdominal pain x3 days EXAM: ULTRASOUND ABDOMEN LIMITED RIGHT UPPER QUADRANT COMPARISON:  CT July 02, 2021. FINDINGS: Gallbladder: Gallbladder is distended without gallstones or wall thickening visualized. No sonographic Murphy sign noted by sonographer. Common bile duct: Diameter: 4 mm Liver: No focal lesion identified. Within normal limits in parenchymal echogenicity. Portal vein is patent on color Doppler imaging with normal direction of blood flow towards the liver. Other: Suboptimal examination of the right upper quadrant secondary to patient habitus. IMPRESSION: No acute sonographic finding in the right upper quadrant on this examination which is suboptimal secondary to patient habitus. Electronically Signed   By: Dahlia Bailiff MD   On: 07/04/2021 16:29        Scheduled Meds:  atorvastatin  40 mg Oral Daily   enoxaparin (LOVENOX) injection  90 mg Subcutaneous Q12H   hydrALAZINE  50 mg Oral BID   insulin aspart  0-15 Units Subcutaneous TID WC   insulin glargine  20 Units Subcutaneous QHS   losartan  100 mg Oral Daily   metoprolol succinate  25 mg Oral Daily   metroNIDAZOLE  500 mg Oral Q12H   sodium chloride flush  3 mL Intravenous Q12H   Continuous Infusions:  sodium chloride     cefTRIAXone (ROCEPHIN)  IV 2 g (07/05/21 1137)     LOS: 3 days    Time spent: 39 minutes spent on chart review, discussion with nursing staff, consultants, updating family and interview/physical exam; more than 50% of that time was spent in counseling and/or coordination of care.    Sharan Mcenaney J [place] DO [facility] Available via Epic secure chat 7am-7pm After these hours, please refer to coverage provider listed on  [email or web address] 07/05/2021, 1:00 PM

## 2021-07-05 NOTE — Care Management Important Message (Signed)
Important Message  Patient Details  Name: Natalie Castro MRN: [id number] Date of Birth: 1941-02-28   Medicare Important Message Given:  Yes     Shelda Altes 07/05/2021, 10:12 AM

## 2021-07-05 NOTE — Progress Notes (Signed)
PHARMACY - PHYSICIAN COMMUNICATION CRITICAL VALUE ALERT - BLOOD CULTURE IDENTIFICATION (BCID)  Natalie Castro is an 80 y.o. female who presented to [facility] on 07/01/2021 with a chief complaint of CP.  Assessment:  Started on IV ABX 7/10 for SIRS and abdominal pain, now w/ E.coli growing in 1 of 2 blood cx bottles.  Name of physician (or Provider) ContactedDeforest Hoyles MD  Current antibiotics: ceftriaxone and metronidazole  Changes to prescribed antibiotics recommended:  Patient is on recommended antibiotics - No changes needed  Results for orders placed or performed during the hospital encounter of 07/01/21  Blood Culture ID Panel (Reflexed) (Collected: 07/04/2021 10:28 AM)  Result Value Ref Range   Enterococcus faecalis NOT DETECTED NOT DETECTED   Enterococcus Faecium NOT DETECTED NOT DETECTED   Listeria monocytogenes NOT DETECTED NOT DETECTED   Staphylococcus species NOT DETECTED NOT DETECTED   Staphylococcus aureus (BCID) NOT DETECTED NOT DETECTED   Staphylococcus epidermidis NOT DETECTED NOT DETECTED   Staphylococcus lugdunensis NOT DETECTED NOT DETECTED   Streptococcus species NOT DETECTED NOT DETECTED   Streptococcus agalactiae NOT DETECTED NOT DETECTED   Streptococcus pneumoniae NOT DETECTED NOT DETECTED   Streptococcus pyogenes NOT DETECTED NOT DETECTED   A.calcoaceticus-baumannii NOT DETECTED NOT DETECTED   Bacteroides fragilis NOT DETECTED NOT DETECTED   Enterobacterales DETECTED (A) NOT DETECTED   Enterobacter cloacae complex NOT DETECTED NOT DETECTED   Escherichia coli DETECTED (A) NOT DETECTED   Klebsiella aerogenes NOT DETECTED NOT DETECTED   Klebsiella oxytoca NOT DETECTED NOT DETECTED   Klebsiella pneumoniae NOT DETECTED NOT DETECTED   Proteus species NOT DETECTED NOT DETECTED   Salmonella species NOT DETECTED NOT DETECTED   Serratia marcescens NOT DETECTED NOT DETECTED   Haemophilus influenzae NOT DETECTED NOT DETECTED   Neisseria meningitidis NOT DETECTED NOT  DETECTED   Pseudomonas aeruginosa NOT DETECTED NOT DETECTED   Stenotrophomonas maltophilia NOT DETECTED NOT DETECTED   Candida albicans NOT DETECTED NOT DETECTED   Candida auris NOT DETECTED NOT DETECTED   Candida glabrata NOT DETECTED NOT DETECTED   Candida krusei NOT DETECTED NOT DETECTED   Candida parapsilosis NOT DETECTED NOT DETECTED   Candida tropicalis NOT DETECTED NOT DETECTED   Cryptococcus neoformans/gattii NOT DETECTED NOT DETECTED   CTX-M ESBL NOT DETECTED NOT DETECTED   Carbapenem resistance IMP NOT DETECTED NOT DETECTED   Carbapenem resistance KPC NOT DETECTED NOT DETECTED   Carbapenem resistance NDM NOT DETECTED NOT DETECTED   Carbapenem resist OXA 48 LIKE NOT DETECTED NOT DETECTED   Carbapenem resistance VIM NOT DETECTED NOT DETECTED    Wynona Neat, PharmD, BCPS  07/05/2021  3:17 AM

## 2021-07-05 NOTE — Progress Notes (Signed)
   07/05/21 1410  Clinical Encounter Type  Visited With Patient and family together  Visit Type Initial  Referral From Nurse  Consult/Referral To Chaplain   Chaplain responded to the consult request. The patient's daughter, [name], was at the bedside. The patient stated she did not request an Advance Directive. This note was prepared by Jeanine Luz, M.Div..  For questions please contact by phone [phone].

## 2021-07-05 NOTE — Progress Notes (Signed)
Cardiology Progress Note  Patient ID: Natalie Castro MRN: [id number] DOB: [birth year] Date of Encounter: 07/05/2021  Primary Cardiologist: Cristopher Peru, MD  Subjective   Chief Complaint:   HPI:  ROS:  All other ROS reviewed and negative. Pertinent positives noted in the HPI.     Inpatient Medications  Scheduled Meds:  atorvastatin  40 mg Oral Daily   enoxaparin (LOVENOX) injection  90 mg Subcutaneous Q12H   hydrALAZINE  50 mg Oral BID   insulin aspart  0-15 Units Subcutaneous TID WC   insulin glargine  15 Units Subcutaneous QHS   losartan  100 mg Oral Daily   metoprolol succinate  25 mg Oral Daily   sodium chloride flush  3 mL Intravenous Q12H   Continuous Infusions:  sodium chloride     PRN Meds: sodium chloride, acetaminophen **OR** acetaminophen, morphine injection, nitroGLYCERIN, ondansetron **OR** ondansetron (ZOFRAN) IV, prochlorperazine, sodium chloride flush   Vital Signs   Vitals:   07/05/21 0019 07/05/21 0500 07/05/21 0510 07/05/21 0616  BP:  (!) 118/58    Pulse:  75    Resp:  (!) 22    Temp: 99.6 F (37.6 C) 97.6 F (36.4 C) (!) 101.2 F (38.4 C) 99.7 F (37.6 C)  TempSrc:  Oral  Oral  SpO2:  95%    Weight:      Height:        Intake/Output Summary (Last 24 hours) at 07/05/2021 0816 Last data filed at 07/04/2021 1700 Gross per 24 hour  Intake 363.05 ml  Output 200 ml  Net 163.05 ml   Last 3 Weights 07/02/2021 07/01/2021 05/20/2021  Weight (lbs) 209 lb 14.1 oz 212 lb 209 lb 6.4 oz  Weight (kg) 95.2 kg 96.163 kg 94.983 kg      Telemetry  Overnight telemetry shows V paced rhythm, which I personally reviewed.   ECG  The most recent ECG shows A sensed V paced rhythm, which I personally reviewed.   Physical Exam   Vitals:   07/05/21 0019 07/05/21 0500 07/05/21 0510 07/05/21 0616  BP:  (!) 118/58    Pulse:  75    Resp:  (!) 22    Temp: 99.6 F (37.6 C) 97.6 F (36.4 C) (!) 101.2 F (38.4 C) 99.7 F (37.6 C)  TempSrc:  Oral  Oral  SpO2:  95%     Weight:      Height:        Intake/Output Summary (Last 24 hours) at 07/05/2021 0816 Last data filed at 07/04/2021 1700 Gross per 24 hour  Intake 363.05 ml  Output 200 ml  Net 163.05 ml    Last 3 Weights 07/02/2021 07/01/2021 05/20/2021  Weight (lbs) 209 lb 14.1 oz 212 lb 209 lb 6.4 oz  Weight (kg) 95.2 kg 96.163 kg 94.983 kg    Body mass index is 34.93 kg/m.  General: Well nourished, well developed, in no acute distress Head: Atraumatic, normal size  Eyes: PEERLA, EOMI  Neck: Supple, no JVD Endocrine: No thryomegaly Cardiac: Normal S1, S2; RRR; no murmurs, rubs, or gallops Lungs: Clear to auscultation bilaterally, no wheezing, rhonchi or rales  Abd: Soft, nontender, no hepatomegaly  Ext: No edema, pulses 2+ Musculoskeletal: No deformities, BUE and BLE strength normal and equal Skin: Warm and dry, no rashes   Neuro: Alert and oriented to person, place, time, and situation, CNII-XII grossly intact, no focal deficits  Psych: Normal mood and affect   Labs  High Sensitivity Troponin:   Recent Labs  Lab  07/01/21 2247 07/02/21 0041  TROPONINIHS 108* 96*     Cardiac EnzymesNo results for input(s): TROPONINI in the last 168 hours. No results for input(s): TROPIPOC in the last 168 hours.  Chemistry Recent Labs  Lab 07/01/21 2247 07/04/21 1028 07/05/21 0633  NA 139 134* 134*  K 3.9 3.6 3.9  CL 108 104 103  CO2 26 22 22   GLUCOSE 208* 184* 201*  BUN 13 11 24*  CREATININE 0.72 0.95 1.34*  CALCIUM 8.6* 8.6* 8.3*  PROT 7.3  --   --   ALBUMIN 3.2*  --   --   AST 12*  --   --   ALT 10  --   --   ALKPHOS 73  --   --   BILITOT 0.3  --   --   GFRNONAA >60 >60 40*  ANIONGAP 5 8 9     Hematology Recent Labs  Lab 07/01/21 2247 07/04/21 1028 07/05/21 0633  WBC 8.1 18.7* 25.8*  RBC 4.08 3.99 3.85*  HGB 12.4 12.2 11.9*  HCT 39.7 37.3 36.3  MCV 97.3 93.5 94.3  MCH 30.4 30.6 30.9  MCHC 31.2 32.7 32.8  RDW 14.7 14.6 14.9  PLT 173 153 158   BNPNo results for input(s): BNP,  PROBNP in the last 168 hours.  DDimer No results for input(s): DDIMER in the last 168 hours.   Radiology  DG Abd 1 View  Result Date: 07/04/2021 CLINICAL DATA:  Fever and abdominal pain. EXAM: ABDOMEN - 1 VIEW COMPARISON:  CT abdomen dated 07/02/2021. FINDINGS: Visualized bowel gas pattern is nonobstructive. No evidence of abnormal fluid collection. No evidence of renal or ureteral calculi. Visualized osseous structures are unremarkable. IMPRESSION: 1. Nonobstructive bowel gas pattern. 2. Recommend RIGHT upper quadrant ultrasound to exclude acute cholecystitis as a possible cause for patient's fever and abdominal pain. These results will be called to the ordering clinician or representative by the Radiologist Assistant, and communication documented in the PACS or [employer]. Electronically Signed   By: Franki Cabot M.D.   On: 07/04/2021 10:48   DG CHEST PORT 1 VIEW  Result Date: 07/04/2021 CLINICAL DATA:  Acute coronary syndrome, fever, abd pain EXAM: PORTABLE CHEST 1 VIEW COMPARISON:  Chest x-ray dated 07/01/2021. FINDINGS: Heart size and mediastinal contours are stable. Lungs are clear. No pleural effusion or pneumothorax is seen. No acute-appearing osseous abnormality. LEFT chest wall pacemaker/ICD apparatus in place. IMPRESSION: No active disease. No evidence of pneumonia or pulmonary edema. Electronically Signed   By: Franki Cabot M.D.   On: 07/04/2021 10:45   US Abdomen Limited RUQ (LIVER/GB)  Result Date: 07/04/2021 CLINICAL DATA:  Abdominal pain x3 days EXAM: ULTRASOUND ABDOMEN LIMITED RIGHT UPPER QUADRANT COMPARISON:  CT July 02, 2021. FINDINGS: Gallbladder: Gallbladder is distended without gallstones or wall thickening visualized. No sonographic Murphy sign noted by sonographer. Common bile duct: Diameter: 4 mm Liver: No focal lesion identified. Within normal limits in parenchymal echogenicity. Portal vein is patent on color Doppler imaging with normal direction of blood flow towards  the liver. Other: Suboptimal examination of the right upper quadrant secondary to patient habitus. IMPRESSION: No acute sonographic finding in the right upper quadrant on this examination which is suboptimal secondary to patient habitus. Electronically Signed   By: Dahlia Bailiff MD   On: 07/04/2021 16:29    Cardiac Studies  LHC 07/02/2021  The left ventricular systolic function is normal. LV end diastolic pressure is mildly elevated. LVEDP 17 mm Hg. The left ventricular ejection fraction  is 55-65% by visual estimate. There is no aortic valve stenosis. Mild nonobstructive coroanry artery disease. Tortuous vessels. Catheter induced spasm of the ostial RCA.  TTE 07/02/2021  1. Hypokinesis of the distal inferior/inferoseptal walls. Changes appear  more prominent than in previous echo in 2017. Marland Kitchen Left ventricular ejection  fraction, by estimation, is 55 to 60%. The left ventricle has normal  function. There is moderate left  ventricular hypertrophy. Elevated left atrial pressure.   2. Right ventricular systolic function is normal. The right ventricular  size is normal. There is normal pulmonary artery systolic pressure.   3. The mitral valve is normal in structure. Mild to moderate mitral valve  regurgitation.   4. The aortic valve is tricuspid. Aortic valve regurgitation is not  visualized. Mild to moderate aortic valve sclerosis/calcification is  present, without any evidence of aortic stenosis.   Patient Profile  Natalie Castro is a 80 y.o. female with hypertension, complete heart block status post pacemaker, diastolic heart failure, PE on Eliquis who was admitted on 07/01/2021 with chest pain and elevated troponin.  Left heart catheterization normal.  She developed fever and right upper quadrant pain on 07/03/2021.  She is now bacteremic with what appears to be E. coli bacteremia.  Assessment & Plan   Chest pain -Admitted with noncardiac chest pain.  Left heart cath with minimal CAD.  Symptoms  are more consistent with sepsis and right upper quadrant pain.  She is now bacteremic with E. coli. -Echo shows normal EF.  2.  Sepsis/E. coli bacteremia/Profound leukocytosis  -Unclear source.  Possible urine.  I have switched her to Zosyn.  Repeat blood cultures are pending. -Lactic acid is cleared. -Right upper quadrant ultrasound is negative. -We will asked hospital medicine to take over her care.  We will also consult infectious disease. -No murmurs on exam.  3. History of PE on eliquis -holding AC until fever sorted out as she may need procedures  4. CHB s/p ppm -device with prolonged AV timing. [name] recommended reprogramming as outpatient  -appropriate pacing now   5. Non-obstructive CAD -minimal CAD -no strong indication for aspirin -continue statin   6. HTN -home antihypertensive agents   FEN -fluids per HMS -code: full -diet: general -dvt ppx: lovenox, restart eliquis once sepsis has been treated and source identified  For questions or updates, please contact [facility] Please consult [email or web address] for contact info under   Time Spent with Patient: I have spent a total of 35 minutes with patient reviewing hospital notes, telemetry, EKGs, labs and examining the patient as well as establishing an assessment and plan that was discussed with the patient.  > 50% of time was spent in direct patient care.    Signed, Addison Naegeli. Audie Box, MD, [place]  07/05/2021 8:16 AM

## 2021-07-05 NOTE — Consult Note (Addendum)
[facility]    Date of Admission:  07/01/2021     Reason for Consult: E. coli bacteremia     Referring Physician: Dr. Davina Poke  Current antibiotics: Ceftriaxone 7/10--present Metronidazole 7/10--present  Previous antibiotics: None  ASSESSMENT:    E. coli bacteremia: Likely related to a GI/biliary source with her symptoms of abdominal pain, nausea, vomiting.  CT abdomen/pelvis on admission with pancreatic duct dilation.  Fortunately her LFTs and T bili are normal.  She has no urinary symptoms to suggest a UTI, however, urine studies have been sent this morning. Complete heart block status post PPM Acute kidney injury: Admitted with creatinine 0.72.  Creatinine 1.34 this morning. Diabetes: Hemoglobin A1c is 7.4 Chest pain and elevated troponins: Left heart cath this admission with mild nonobstructive CAD Severe sepsis secondary to #1 Penicillin allergy: Tolerating ceftriaxone thus far and patient without any recollection as to the specifics of her reaction  PLAN:    Continue ceftriaxone 2 g daily and metronidazole 500 mg twice daily Follow up urine studies pending.   Recommend GI evaluation to  consider MRCP (if compatible with her PPM) vs possible ERCP given CT findings of dilated pancreatic duct Glycemic control Lab monitoring Will follow   Principal Problem:   Bacteremia Active Problems:   Essential hypertension, benign   Saddle pulmonary embolus (HCC)   Elevated troponin I level   Uncontrolled type 2 diabetes mellitus with hyperglycemia (HCC)   Acute coronary syndrome (HCC)   Protein-calorie malnutrition, mild (HCC)   Chest pain   Sepsis (HCC)   MEDICATIONS:    Scheduled Meds:  atorvastatin  40 mg Oral Daily   enoxaparin (LOVENOX) injection  90 mg Subcutaneous Q12H   hydrALAZINE  50 mg Oral BID   insulin aspart  0-15 Units Subcutaneous TID WC   insulin glargine  15 Units Subcutaneous QHS   losartan  100 mg Oral Daily   metoprolol  succinate  25 mg Oral Daily   sodium chloride flush  3 mL Intravenous Q12H   Continuous Infusions:  sodium chloride     cefTRIAXone (ROCEPHIN)  IV 2 g (07/05/21 1137)   PRN Meds:.sodium chloride, acetaminophen **OR** acetaminophen, morphine injection, nitroGLYCERIN, ondansetron **OR** ondansetron (ZOFRAN) IV, prochlorperazine, sodium chloride flush  HPI:    Natalie Castro is a 80 y.o. female with a past medical history significant for hypertension, diabetes, complete heart block status post pacemaker, diastolic heart failure, nonobstructive CAD, history of PE on Eliquis who was admitted 07/01/2021 with chest pain, SOB, nausea/vomiting, and elevated troponin levels.  She underwent left heart catheterization which was normal.  Unfortunately, she developed fever and right upper quadrant pain on 07/03/2021.  Blood cultures were obtained which are positive for gram-negative rods and E. coli detected on BC ID.  She was started on ceftriaxone and metronidazole.  She had CT angiography of the chest, abdomen, pelvis on admission which was notable for dilated pancreatic duct without discrete mass identified.  Also noted on imaging was several low attenuating lesions in the liver most likely representing cyst.  She had an ultrasound of the right upper quadrant obtained yesterday which showed no acute findings in the right upper quadrant.  Her labs are notable for normal LFTs, T bili 1.2, creatinine 1.3 (increased from 0.7 on admission), WBC 25.8.  She had a lactic acidosis of 2.3 which has subsequently normalized on repeat measurement.  We have been consulted for further antibiotic recommendations.   Past Medical History:  Diagnosis Date  Acid reflux    Arthritis    Colon adenomas    AGE 33   Complete heart block (HCC)    STJ PPM Dr. 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, long term anticoag with apixaban    Hyperlipidemia    Myoview 03/2021    Myoview  4/22: EF 60, no infarct or ischemia; low risk   Presence of permanent cardiac pacemaker    Type 2 diabetes mellitus (HCC)     Social History   Tobacco Use   Smoking status: Never   Smokeless tobacco: Never  Vaping Use   Vaping Use: Never used  Substance Use Topics   Alcohol use: No    Alcohol/week: 0.0 standard drinks   Drug use: No    Family History  Problem Relation Age of Onset   Heart failure Mother    Hypertension Mother    Heart failure Sister    Hypertension Father    Cancer Sister    Dementia Sister    Colon cancer Neg Hx    Colon polyps Neg Hx     Allergies  Allergen Reactions   Penicillins Hives, Itching and Other (See Comments)    Has tolerated ceftriaxone 7/22 Has patient had a PCN reaction causing immediate rash, facial/tongue/throat swelling, SOB or lightheadedness with hypotension: No Has patient had a PCN reaction causing severe rash involving mucus membranes or skin necrosis: Yes Has patient had a PCN reaction that required hospitalization No Has patient had a PCN reaction occurring within the last 10 years: No If all of the above answers are "NO", then may proceed with Cephalosporin use.     Review of Systems  Constitutional:  Positive for fever.  Gastrointestinal:  Positive for abdominal pain, nausea and vomiting.  Genitourinary:  Negative for dysuria and urgency.  Musculoskeletal: Negative.   Skin:  Negative for itching and rash.  All other systems reviewed and are negative.  OBJECTIVE:   Blood pressure (!) 113/51, pulse 85, temperature 98.8 F (37.1 C), temperature source Oral, resp. rate (!) 25, height 5\' 5"  (1.651 m), weight 95.2 kg, SpO2 95 %. Body mass index is 34.93 kg/m.  Physical Exam Constitutional:      General: She is not in acute distress.    Appearance: She is well-developed.  HENT:     Head: Normocephalic and atraumatic.  Eyes:     Extraocular Movements: Extraocular movements intact.     Pupils: Pupils are equal,  round, and reactive to light.  Cardiovascular:     Rate and Rhythm: Normal rate and regular rhythm.     Heart sounds: No murmur heard. Pulmonary:     Effort: Pulmonary effort is normal. No respiratory distress.     Breath sounds: Normal breath sounds.  Abdominal:     Palpations: Abdomen is soft.     Tenderness: There is abdominal tenderness. There is no guarding or rebound.  Musculoskeletal:        General: Normal range of motion.  Skin:    General: Skin is warm and dry.     Coloration: Skin is not jaundiced.  Neurological:     General: No focal deficit present.     Mental Status: She is alert and oriented to person, place, and time.  Psychiatric:        Mood and Affect: Mood normal.        Behavior: Behavior normal.     Lab Results: Lab Results  Component Value Date   WBC 25.8 (H) 07/05/2021   HGB 11.9 (L) 07/05/2021   HCT 36.3 07/05/2021   MCV 94.3 07/05/2021   PLT 158 07/05/2021    Lab Results  Component Value Date   NA 134 (L) 07/05/2021   K 3.9 07/05/2021   CO2 22 07/05/2021   GLUCOSE 201 (H) 07/05/2021   BUN 24 (H) 07/05/2021   CREATININE 1.34 (H) 07/05/2021   CALCIUM 8.3 (L) 07/05/2021   GFRNONAA 40 (L) 07/05/2021   GFRAA 91 06/30/2020    Lab Results  Component Value Date   ALT 10 07/05/2021   AST 14 (L) 07/05/2021   ALKPHOS 52 07/05/2021   BILITOT 1.2 07/05/2021    No results found for: CRP  No results found for: ESRSEDRATE  I have reviewed the micro and lab results in Epic.  Imaging: DG Abd 1 View  Result Date: 07/04/2021 CLINICAL DATA:  Fever and abdominal pain. EXAM: ABDOMEN - 1 VIEW COMPARISON:  CT abdomen dated 07/02/2021. FINDINGS: Visualized bowel gas pattern is nonobstructive. No evidence of abnormal fluid collection. No evidence of renal or ureteral calculi. Visualized osseous structures are unremarkable. IMPRESSION: 1. Nonobstructive bowel gas pattern. 2. Recommend RIGHT upper quadrant ultrasound to exclude acute cholecystitis as a  possible cause for patient's fever and abdominal pain. These results will be called to the ordering clinician or representative by the Radiologist Assistant, and communication documented in the PACS or [employer]. Electronically Signed   By: Franki Cabot M.D.   On: 07/04/2021 10:48   DG CHEST PORT 1 VIEW  Result Date: 07/04/2021 CLINICAL DATA:  Acute coronary syndrome, fever, abd pain EXAM: PORTABLE CHEST 1 VIEW COMPARISON:  Chest x-ray dated 07/01/2021. FINDINGS: Heart size and mediastinal contours are stable. Lungs are clear. No pleural effusion or pneumothorax is seen. No acute-appearing osseous abnormality. LEFT chest wall pacemaker/ICD apparatus in place. IMPRESSION: No active disease. No evidence of pneumonia or pulmonary edema. Electronically Signed   By: Franki Cabot M.D.   On: 07/04/2021 10:45   US Abdomen Limited RUQ (LIVER/GB)  Result Date: 07/04/2021 CLINICAL DATA:  Abdominal pain x3 days EXAM: ULTRASOUND ABDOMEN LIMITED RIGHT UPPER QUADRANT COMPARISON:  CT July 02, 2021. FINDINGS: Gallbladder: Gallbladder is distended without gallstones or wall thickening visualized. No sonographic Murphy sign noted by sonographer. Common bile duct: Diameter: 4 mm Liver: No focal lesion identified. Within normal limits in parenchymal echogenicity. Portal vein is patent on color Doppler imaging with normal direction of blood flow towards the liver. Other: Suboptimal examination of the right upper quadrant secondary to patient habitus. IMPRESSION: No acute sonographic finding in the right upper quadrant on this examination which is suboptimal secondary to patient habitus. Electronically Signed   By: Dahlia Bailiff MD   On: 07/04/2021 16:29     Imaging independently reviewed in Epic.  Raynelle [facility] [phone] pager 07/05/2021, 12:34 PM

## 2021-07-05 NOTE — Consult Note (Addendum)
Referring Provider:  [facility]         Primary Care Physician:  [name], MD Primary Gastroenterologist:    Was Barney Drain, MD         We were asked to see this patient for:  dilated pancreatic duct, bacteremia                ASSESSMENT / PLAN:   80 yo female originally admitted for chest pain which has resolved. While admitted she developed marked leukocytosis, E.Coli bacteremia without determined source. ID concerned about GI source with mildly dilated pancreatic duct on CT scan. MRCP recommended by Radiologist but patient has a pacemaker.  --We were asked to see regarding possible ERCP. No evidence for biliary obstruction. No pancreatic masses on CT scan.. I do not think there is an indication for ERCP but will discuss with Dr. [name].    # Nausea / vomiting ( started around 7/4) . She also had diarrhea that week but says that is not uncommon for her. No further vomiting but she has persistent nausea .  --She was changed from IV to PO flagyl today which may exacerbate her nausea.  --She hasn't gotten any of the prn anti-emetics in three days. Would ask that she try Zofran to see if it helps.  --No taking NSAIDs at home but not unreasonable to add a PPI ( short term)   I have evaluated the patient myself as well.  I looked at her CT scan.  There is a diffusely mildly dilated pancreatic duct may be 4 to 5 mm which is age-appropriate in her I think.  I do not think it has anything to do with her E. coli bacteremia.  Radiology did suggest a possible MRCP but she does have a pacemaker which can make that difficult depending upon the pacer type and there is no mass lesion and I do not think that there is any link to her current problem so I would not pursue that.  Additionally I reviewed her CT scan abdomen and pelvis from June 2016 and I think the pancreatic duct looks very similar.  This is another good sign.  We will sign off and be available if needed  otherwise.   Gatha Mayer, MD, [facility] 07/05/2021 4:14 PM   HPI:                                                                                                                             Chief Complaint:  nausea  Natalie Castro is a 80 y.o. female with a past medical history significant for complete heart block s/p PPM, hypertension, hyperlipidemia, DM2, history of PE on anticoagulation, chronic diastolic CHF.   Patient admitted 7/8 for chest pain and nausea. Troponins were elevated.  CTA showed moderate diffuse aortic calcifications but no evidence of aortic aneurysm or dissection. Cardiology following She hasn't had any further chest pain. While hospitalized patient has developed  markedly leukocytosis. Blood culture positive fro E.coli. No source of bacteremia has been determined. She has been tender in RUQ on provider's exam but denies actual pain in the area. [place] negative for gallstones / cholecystitis. On admission, in addition to CTA chest she also had CTAP w/ contrast. Severe liver lesions seen and felt to be cysts. PD was mildly dilated, no discrete mass was seen. MRCP was recommended but patient has a pacemaker so ID has GI to evaluate for ERCP.   Patient tells me that around 06/28/21 she developed nausea, vomiting and diarrhea. These symptoms persisted through the week. Stools turned dark but she had taken Kaopectate. Upon further questioning she gets diarrhea every few weeks and keeps Kaopectate on hand. The nausea / vomiting however are new for her. She hasn't recently started any new medications to correlate with the nausea.  No NSAID use. She hasn't vomited in the last couple of days but still having periods of nausea. She denies abdominal pain associated with above symptoms but does mention that her upper right abdomen hurts when providers push on the area.    PREVIOUS ENDOSCOPIC EVALUATIONS / PERTINENT STUDIES   May 2019 polyp surveillance colonoscopy  Four  2 to 4 mm polyps in the distal transverse colon, at the hepatic flexure(2) and in the ascending colon, removed with a cold snare. Resected and retrieved. - Redundant LEFT colon. - External and internal hemorrhoids.  Polyps path - TA without  GD   Past Medical History:  Diagnosis Date   Acid reflux    Arthritis    Colon adenomas    AGE 33   Complete heart block (HCC)    STJ PPM Dr. 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, long term anticoag with apixaban    Hyperlipidemia    Myoview 03/2021    Myoview 4/22: EF 50, no infarct or ischemia; low risk   Presence of permanent cardiac pacemaker    Type 2 diabetes mellitus ([place])     Past Surgical History:  Procedure Laterality Date   COLONOSCOPY     3 SIMPLE ADENOMAS, AGE 56   COLONOSCOPY N/A 05/24/2018   Procedure: COLONOSCOPY;  Surgeon: Danie Binder, MD;  [facility];  Service: Endoscopy;  Laterality: N/A;  1:00pm   EP IMPLANTABLE DEVICE N/A 11/24/2015   Procedure: Pacemaker Implant;  Surgeon: [name], MD;  Location: [place] [facility];  Service: Cardiovascular;  Laterality: N/A;   JOINT REPLACEMENT     knees bilat.   LEFT HEART CATH AND CORONARY ANGIOGRAPHY N/A 07/02/2021   Procedure: LEFT HEART CATH AND CORONARY ANGIOGRAPHY;  Surgeon: Jettie Booze, MD;  Location: [facility];  Service: Cardiovascular;  Laterality: N/A;   POLYPECTOMY  05/24/2018   Procedure: POLYPECTOMY;  Surgeon: Danie Binder, MD;  [facility]: [facility];  Service: Endoscopy;;  ascending and hepatic flexure, transverse   TUBAL LIGATION      Prior to Admission medications   Medication Sig Start Date End Date Taking? Authorizing Provider  atorvastatin (LIPITOR) 40 MG tablet TAKE (1) TABLET BY MOUTH AT BEDTIME. Patient taking differently: Take 40 mg by mouth daily. 05/28/20  Yes Corum, Rex Kras, MD  ELIQUIS 5 MG TABS tablet TAKE 1 TABLET BY MOUTH TWICE A DAY. Patient taking  differently: Take 5 mg by mouth 2 (two) times daily. 05/05/20  Yes Corum, Rex Kras, MD  hydrALAZINE (APRESOLINE) 50 MG tablet Take 50 mg  by mouth 3 (three) times daily.   Yes [provider]  LANTUS SOLOSTAR 100 UNIT/ML Solostar Pen Inject 30 Units into the skin at bedtime. 07/06/20  Yes Nida, Marella Chimes, MD  losartan (COZAAR) 100 MG tablet TAKE (1) TABLET BY MOUTH ONCE DAILY. Patient taking differently: Take 100 mg by mouth daily. 05/28/20  Yes Corum, Rex Kras, MD  metFORMIN (GLUCOPHAGE) 1000 MG tablet TAKE (1) TABLET BY MOUTH TWICE DAILY. Patient taking differently: Take 1,000 mg by mouth 2 (two) times daily with a meal. 05/17/21  Yes Nida, Marella Chimes, MD  NIFEdipine (PROCARDIA-XL/NIFEDICAL-XL) 30 MG 24 hr tablet TAKE 1 TABLET BY MOUTH ONCE A DAY. Patient taking differently: Take 30 mg by mouth daily. 05/28/20  Yes Corum, Rex Kras, MD  glucose blood test strip Use to check blood glucose fasting , before lunch and dinner and after your largest meal 11/04/19   Corum, Rex Kras, MD  hydrALAZINE (APRESOLINE) 25 MG tablet Take 1 tablet (25 mg total) by mouth 2 (two) times daily. 07/03/21   Kathyrn Drown D, NP  Lancets [facility] ULTRASOFT) lancets Use to check glucose 4 x daily 11/04/19   Corum, Rex Kras, MD  metoprolol succinate (TOPROL-XL) 25 MG 24 hr tablet Take 1 tablet (25 mg total) by mouth daily. 07/03/21   Tommie Raymond, NP  nitroGLYCERIN (NITROSTAT) 0.4 MG SL tablet Place 1 tablet (0.4 mg total) under the tongue every 5 (five) minutes as needed for chest pain. Patient not taking: Reported on 07/02/2021 02/24/21 05/25/21  Richardson Dopp T, PA-C    Current Facility-Administered Medications  Medication Dose Route Frequency Provider Last Rate Last Admin   0.9 %  sodium chloride infusion  250 mL Intravenous PRN Jettie Booze, MD       acetaminophen (TYLENOL) tablet 650 mg  650 mg Oral Q6H PRN Erma Heritage, PA-C   650 mg at 07/05/21 1694   Or   acetaminophen (TYLENOL) suppository 650 mg   650 mg Rectal Q6H PRN Ahmed Prima, [place] M, PA-C       atorvastatin (LIPITOR) tablet 40 mg  40 mg Oral Daily Bernerd Pho M, PA-C   40 mg at 07/05/21 5038   cefTRIAXone (ROCEPHIN) 2 g in sodium chloride 0.9 % 100 mL IVPB  2 g Intravenous Q24H Lelon Perla, MD 200 mL/hr at 07/05/21 1137 2 g at 07/05/21 1137   enoxaparin (LOVENOX) injection 90 mg  90 mg Subcutaneous Q12H Fuller Plan A, MD   90 mg at 07/05/21 0851   hydrALAZINE (APRESOLINE) tablet 50 mg  50 mg Oral BID Deboraha Sprang, MD   50 mg at 07/05/21 0851   insulin aspart (novoLOG) injection 0-15 Units  0-15 Units Subcutaneous TID WC Erma Heritage, PA-C   3 Units at 07/05/21 1351   insulin glargine (LANTUS) injection 20 Units  20 Units Subcutaneous QHS [place] Eric J, DO       losartan (COZAAR) tablet 100 mg  100 mg Oral Daily Bernerd Pho M, PA-C   100 mg at 07/05/21 8828   metoprolol succinate (TOPROL-XL) 24 hr tablet 25 mg  25 mg Oral Daily Bernerd Pho M, PA-C   25 mg at 07/05/21 0034   metroNIDAZOLE (FLAGYL) tablet 500 mg  500 mg Oral Q12H Mignon Pine, DO       morphine 2 MG/ML injection 2 mg  2 mg Intravenous Q2H PRN Ahmed Prima, [place] M, PA-C       nitroGLYCERIN (NITROSTAT) SL tablet 0.4 mg  0.4  mg Sublingual Q5 min PRN Ahmed Prima, [place] M, PA-C       ondansetron [facility]) tablet 4 mg  4 mg Oral Q6H PRN Ahmed Prima, [place] M, PA-C       Or   ondansetron [facility]) injection 4 mg  4 mg Intravenous Q6H PRN Ahmed Prima, [place] M, PA-C   4 mg at 07/03/21 1555   prochlorperazine (COMPAZINE) injection 10 mg  10 mg Intravenous Q6H PRN Renae Fickle, MD   10 mg at 07/03/21 1954   sodium chloride flush (NS) 0.9 % injection 3 mL  3 mL Intravenous Q12H Jettie Booze, MD   3 mL at 07/05/21 0853   sodium chloride flush (NS) 0.9 % injection 3 mL  3 mL Intravenous PRN Jettie Booze, MD        Allergies as of 07/01/2021 - Review Complete 07/01/2021  Allergen Reaction Noted   Penicillins Hives, Itching,  and Other (See Comments)     Family History  Problem Relation Age of Onset   Heart failure Mother    Hypertension Mother    Heart failure Sister    Hypertension Father    Cancer Sister    Dementia Sister    Colon cancer Neg Hx    Colon polyps Neg Hx     Social History   Socioeconomic History   Marital status: Widowed    Spouse name: Not on file   Number of children: Not on file   Years of education: Not on file   Highest education level: Not on file  Occupational History   Occupation: retired  Tobacco Use   Smoking status: Never   Smokeless tobacco: Never  Vaping Use   Vaping Use: Never used  Substance and Sexual Activity   Alcohol use: No    Alcohol/week: 0.0 standard drinks   Drug use: No   Sexual activity: Yes    Birth control/protection: Post-menopausal  Other Topics Concern   Not on file  Social History Narrative   MARRIED FOR 76 YRS. 5 KIDS: #4 PRESENT TODAY(AGE 40)   Social Determinants of Health   Financial Resource Strain: Not on file  Food Insecurity: Not on file  Transportation Needs: Not on file  Physical Activity: Not on file  Stress: Not on file  Social Connections: Not on file  Intimate Partner Violence: Not on file    Review of Systems: All systems reviewed and negative except where noted in HPI.   OBJECTIVE:    Physical Exam: Vital signs in last 24 hours: Temp:  [97.6 F (36.4 C)-101.2 F (38.4 C)] 98.9 F (37.2 C) (07/11 1438) Pulse Rate:  [75-89] 88 (07/11 1438) Resp:  [20-30] 24 (07/11 1438) BP: (108-120)/(44-58) 116/51 (07/11 1438) SpO2:  [94 %-95 %] 95 % (07/11 0842) Last BM Date: 07/02/21 General:   Alert  female in NAD Psych:  Pleasant, cooperative. Normal mood and affect. Eyes:  Pupils equal, sclera clear, no icterus.   Conjunctiva pink. Ears:  Normal auditory acuity. Nose:  No deformity, discharge,  or lesions. Neck:  Supple; no masses Lungs:  Clear throughout to auscultation.   No wheezes, crackles, or rhonchi.   Heart:  Regular rate and rhythm, no lower extremity edema Abdomen:  Soft, non-distended, very minimal RUQ tenderness, BS active, no palp mass   Rectal:  Deferred  Msk:  Symmetrical without gross deformities. . Neurologic:  Alert and  oriented x4;  grossly normal neurologically. Skin:  Intact without significant lesions or rashes.  Filed Weights   07/01/21 2245  07/02/21 0238  Weight: 96.2 kg 95.2 kg     Scheduled inpatient medications  atorvastatin  40 mg Oral Daily   enoxaparin (LOVENOX) injection  90 mg Subcutaneous Q12H   hydrALAZINE  50 mg Oral BID   insulin aspart  0-15 Units Subcutaneous TID WC   insulin glargine  20 Units Subcutaneous QHS   losartan  100 mg Oral Daily   metoprolol succinate  25 mg Oral Daily   metroNIDAZOLE  500 mg Oral Q12H   sodium chloride flush  3 mL Intravenous Q12H      Intake/Output from previous day: 07/10 0701 - 07/11 0700 In: 363.1 [I.V.:126; IV Piggyback:237.1] Out: 200 [Urine:200] Intake/Output this shift: Total I/O In: -  Out: 450 [Urine:450]   Lab Results: Recent Labs    07/04/21 1028 07/05/21 0633  WBC 18.7* 25.8*  HGB 12.2 11.9*  HCT 37.3 36.3  PLT 153 158   BMET Recent Labs    07/04/21 1028 07/05/21 0633  NA 134* 134*  K 3.6 3.9  CL 104 103  CO2 22 22  GLUCOSE 184* 201*  BUN 11 24*  CREATININE 0.95 1.34*  CALCIUM 8.6* 8.3*   LFT Recent Labs    07/05/21 0633  PROT 6.4*  ALBUMIN 2.3*  AST 14*  ALT 10  ALKPHOS 52  BILITOT 1.2  BILIDIR 0.3*  IBILI 0.9    . CBC Latest Ref Rng & Units 07/05/2021 07/04/2021 07/01/2021  WBC 4.0 - 10.5 K/uL 25.8(H) 18.7(H) 8.1  Hemoglobin 12.0 - 15.0 g/dL 11.9(L) 12.2 12.4  Hematocrit 36.0 - 46.0 % 36.3 37.3 39.7  Platelets 150 - 400 K/uL 158 153 173    . CMP Latest Ref Rng & Units 07/05/2021 07/04/2021 07/01/2021  Glucose 70 - 99 mg/dL 201(H) 184(H) 208(H)  BUN 8 - 23 mg/dL 24(H) 11 13  Creatinine 0.44 - 1.00 mg/dL 1.34(H) 0.95 0.72  Sodium 135 - 145 mmol/L 134(L) 134(L)  139  Potassium 3.5 - 5.1 mmol/L 3.9 3.6 3.9  Chloride 98 - 111 mmol/L 103 104 108  CO2 22 - 32 mmol/L 22 22 26   Calcium 8.9 - 10.3 mg/dL 8.3(L) 8.6(L) 8.6(L)  Total Protein 6.5 - 8.1 g/dL 6.4(L) - 7.3  Total Bilirubin 0.3 - 1.2 mg/dL 1.2 - 0.3  Alkaline Phos 38 - 126 U/L 52 - 73  AST 15 - 41 U/L 14(L) - 12(L)  ALT 0 - 44 U/L 10 - 10   Studies/Results: DG Abd 1 View  Result Date: 07/04/2021 CLINICAL DATA:  Fever and abdominal pain. EXAM: ABDOMEN - 1 VIEW COMPARISON:  CT abdomen dated 07/02/2021. FINDINGS: Visualized bowel gas pattern is nonobstructive. No evidence of abnormal fluid collection. No evidence of renal or ureteral calculi. Visualized osseous structures are unremarkable. IMPRESSION: 1. Nonobstructive bowel gas pattern. 2. Recommend RIGHT upper quadrant ultrasound to exclude acute cholecystitis as a possible cause for patient's fever and abdominal pain. These results will be called to the ordering clinician or representative by the Radiologist Assistant, and communication documented in the PACS or [employer]. Electronically Signed   By: Franki Cabot M.D.   On: 07/04/2021 10:48   DG CHEST PORT 1 VIEW  Result Date: 07/04/2021 CLINICAL DATA:  Acute coronary syndrome, fever, abd pain EXAM: PORTABLE CHEST 1 VIEW COMPARISON:  Chest x-ray dated 07/01/2021. FINDINGS: Heart size and mediastinal contours are stable. Lungs are clear. No pleural effusion or pneumothorax is seen. No acute-appearing osseous abnormality. LEFT chest wall pacemaker/ICD apparatus in place. IMPRESSION: No active disease. No  evidence of pneumonia or pulmonary edema. Electronically Signed   By: Franki Cabot M.D.   On: 07/04/2021 10:45   US Abdomen Limited RUQ (LIVER/GB)  Result Date: 07/04/2021 CLINICAL DATA:  Abdominal pain x3 days EXAM: ULTRASOUND ABDOMEN LIMITED RIGHT UPPER QUADRANT COMPARISON:  CT July 02, 2021. FINDINGS: Gallbladder: Gallbladder is distended without gallstones or wall thickening visualized. No  sonographic Murphy sign noted by sonographer. Common bile duct: Diameter: 4 mm Liver: No focal lesion identified. Within normal limits in parenchymal echogenicity. Portal vein is patent on color Doppler imaging with normal direction of blood flow towards the liver. Other: Suboptimal examination of the right upper quadrant secondary to patient habitus. IMPRESSION: No acute sonographic finding in the right upper quadrant on this examination which is suboptimal secondary to patient habitus. Electronically Signed   By: Dahlia Bailiff MD   On: 07/04/2021 16:29       Tye Savoy, NP-C @  07/05/2021, 2:46 PM

## 2021-07-06 ENCOUNTER — Telehealth: Payer: Self-pay

## 2021-07-06 DIAGNOSIS — R509 Fever, unspecified: Secondary | ICD-10-CM | POA: Diagnosis not present

## 2021-07-06 DIAGNOSIS — R7881 Bacteremia: Secondary | ICD-10-CM | POA: Diagnosis not present

## 2021-07-06 DIAGNOSIS — R109 Unspecified abdominal pain: Secondary | ICD-10-CM | POA: Diagnosis not present

## 2021-07-06 DIAGNOSIS — A4151 Sepsis due to Escherichia coli [E. coli]: Secondary | ICD-10-CM | POA: Diagnosis not present

## 2021-07-06 LAB — GLUCOSE, CAPILLARY
Glucose-Capillary: 123 mg/dL — ABNORMAL HIGH (ref 70–99)
Glucose-Capillary: 109 mg/dL — ABNORMAL HIGH (ref 70–99)
Glucose-Capillary: 103 mg/dL — ABNORMAL HIGH (ref 70–99)
Glucose-Capillary: 142 mg/dL — ABNORMAL HIGH (ref 70–99)

## 2021-07-06 LAB — CBC
HCT: 35.8 % — ABNORMAL LOW (ref 36.0–46.0)
Hemoglobin: 11.4 g/dL — ABNORMAL LOW (ref 12.0–15.0)
MCH: 30.2 pg (ref 26.0–34.0)
MCHC: 31.8 g/dL (ref 30.0–36.0)
MCV: 95 fL (ref 80.0–100.0)
Platelets: 149 10*3/uL — ABNORMAL LOW (ref 150–400)
RBC: 3.77 MIL/uL — ABNORMAL LOW (ref 3.87–5.11)
RDW: 14.9 % (ref 11.5–15.5)
WBC: 22.1 10*3/uL — ABNORMAL HIGH (ref 4.0–10.5)
nRBC: 0.1 % (ref 0.0–0.2)

## 2021-07-06 LAB — COMPREHENSIVE METABOLIC PANEL
ALT: 12 U/L (ref 0–44)
AST: 13 U/L — ABNORMAL LOW (ref 15–41)
Albumin: 2.1 g/dL — ABNORMAL LOW (ref 3.5–5.0)
Alkaline Phosphatase: 55 U/L (ref 38–126)
Anion gap: 6 (ref 5–15)
BUN: 25 mg/dL — ABNORMAL HIGH (ref 8–23)
CO2: 24 mmol/L (ref 22–32)
Calcium: 8.3 mg/dL — ABNORMAL LOW (ref 8.9–10.3)
Chloride: 106 mmol/L (ref 98–111)
Creatinine, Ser: 1.14 mg/dL — ABNORMAL HIGH (ref 0.44–1.00)
GFR, Estimated: 49 mL/min — ABNORMAL LOW (ref >60)
Glucose, Bld: 131 mg/dL — ABNORMAL HIGH (ref 70–99)
Potassium: 3.9 mmol/L (ref 3.5–5.1)
Sodium: 136 mmol/L (ref 135–145)
Total Bilirubin: 1 mg/dL (ref 0.3–1.2)
Total Protein: 6.1 g/dL — ABNORMAL LOW (ref 6.5–8.1)

## 2021-07-06 LAB — MAGNESIUM: Magnesium: 2 mg/dL (ref 1.7–2.4)

## 2021-07-06 LAB — PROCALCITONIN: Procalcitonin: 3.13 ng/mL

## 2021-07-06 LAB — URINE CULTURE: Culture: NO GROWTH

## 2021-07-06 MED ORDER — SODIUM CHLORIDE 0.9 % IV SOLN: INTRAVENOUS | Status: DC

## 2021-07-06 NOTE — Progress Notes (Signed)
[facility]  Date of Admission:  07/01/2021           Reason for visit: Follow up on E coli bacteremia  Current antibiotics: Ceftriaxone 07/04/21 MTZ 7/10--present   ASSESSMENT:    She has E coli bacteremia that is likely secondary to a GI/biliary source. Appreciate GI evaluation yesterday.  Continues to be febrile day #3 of antibiotics concerning for an uncontrolled source of infection complicated by PPM in place not compatible with MRI.  PLAN:    Continue CTX 2 gm daily and MTZ 500mg  BID Discussed with primary.  Planning for surgery consult to evaluate GB Repeat blood cx today to ensure clearance.  If continued bacteremia may need to consider TEE to make sure CIED is not involved Will follow   Principal Problem:   Septicemia due to E. coli ([place]) Active Problems:   Essential hypertension, benign   Saddle pulmonary embolus (HCC)   Elevated troponin I level   Uncontrolled type 2 diabetes mellitus with hyperglycemia (HCC)   Acute coronary syndrome (HCC)   Protein-calorie malnutrition, mild (HCC)   Chest pain   Sepsis ([place])   Bacteremia   Acute kidney injury ([place])   Pancreatic duct dilated    MEDICATIONS:    Scheduled Meds:  atorvastatin  40 mg Oral Daily   enoxaparin (LOVENOX) injection  90 mg Subcutaneous Q12H   hydrALAZINE  50 mg Oral BID   insulin aspart  0-15 Units Subcutaneous TID WC   insulin glargine  20 Units Subcutaneous QHS   losartan  100 mg Oral Daily   metoprolol succinate  25 mg Oral Daily   metroNIDAZOLE  500 mg Oral Q12H   sodium chloride flush  3 mL Intravenous Q12H   Continuous Infusions:  sodium chloride     cefTRIAXone (ROCEPHIN)  IV 2 g (07/05/21 1137)   PRN Meds:.sodium chloride, acetaminophen **OR** acetaminophen, morphine injection, nitroGLYCERIN, ondansetron **OR** ondansetron (ZOFRAN) IV, prochlorperazine, sodium chloride flush  SUBJECTIVE:   24 hour events:  Continues to be febrile LFTs  normal Creatinine improved WBC stable/improved   Pt feels improved.  "Just feeling a little better" Has RUQ pain and fevers No nausea or vomiting. No CP, SOB  Review of Systems  All other systems reviewed and are negative.    OBJECTIVE:   Blood pressure (!) 111/54, pulse 76, temperature 98.3 F (36.8 C), temperature source Oral, resp. rate (!) 25, height 5\' 5"  (1.651 m), weight 95.2 kg, SpO2 95 %. Body mass index is 34.93 kg/m.  Physical Exam Constitutional:      General: She is not in acute distress.    Appearance: She is well-developed.  Pulmonary:     Effort: Pulmonary effort is normal. No respiratory distress.  Abdominal:     Palpations: Abdomen is soft.     Tenderness: There is abdominal tenderness. There is guarding.  Skin:    General: Skin is warm and dry.     Coloration: Skin is not jaundiced.  Neurological:     General: No focal deficit present.     Mental Status: She is alert and oriented to person, place, and time.  Psychiatric:        Mood and Affect: Mood normal.        Behavior: Behavior normal.     Lab Results: Lab Results  Component Value Date   WBC 22.1 (H) 07/06/2021   HGB 11.4 (L) 07/06/2021   HCT 35.8 (L) 07/06/2021   MCV 95.0  07/06/2021   PLT 149 (L) 07/06/2021    Lab Results  Component Value Date   NA 136 07/06/2021   K 3.9 07/06/2021   CO2 24 07/06/2021   GLUCOSE 131 (H) 07/06/2021   BUN 25 (H) 07/06/2021   CREATININE 1.14 (H) 07/06/2021   CALCIUM 8.3 (L) 07/06/2021   GFRNONAA 49 (L) 07/06/2021   GFRAA 91 06/30/2020    Lab Results  Component Value Date   ALT 12 07/06/2021   AST 13 (L) 07/06/2021   ALKPHOS 55 07/06/2021   BILITOT 1.0 07/06/2021    No results found for: CRP  No results found for: ESRSEDRATE   I have reviewed the micro and lab results in Epic.  Imaging: DG Abd 1 View  Result Date: 07/04/2021 CLINICAL DATA:  Fever and abdominal pain. EXAM: ABDOMEN - 1 VIEW COMPARISON:  CT abdomen dated 07/02/2021.  FINDINGS: Visualized bowel gas pattern is nonobstructive. No evidence of abnormal fluid collection. No evidence of renal or ureteral calculi. Visualized osseous structures are unremarkable. IMPRESSION: 1. Nonobstructive bowel gas pattern. 2. Recommend RIGHT upper quadrant ultrasound to exclude acute cholecystitis as a possible cause for patient's fever and abdominal pain. These results will be called to the ordering clinician or representative by the Radiologist Assistant, and communication documented in the PACS or [employer]. Electronically Signed   By: Franki Cabot M.D.   On: 07/04/2021 10:48   DG CHEST PORT 1 VIEW  Result Date: 07/04/2021 CLINICAL DATA:  Acute coronary syndrome, fever, abd pain EXAM: PORTABLE CHEST 1 VIEW COMPARISON:  Chest x-ray dated 07/01/2021. FINDINGS: Heart size and mediastinal contours are stable. Lungs are clear. No pleural effusion or pneumothorax is seen. No acute-appearing osseous abnormality. LEFT chest wall pacemaker/ICD apparatus in place. IMPRESSION: No active disease. No evidence of pneumonia or pulmonary edema. Electronically Signed   By: Franki Cabot M.D.   On: 07/04/2021 10:45   US Abdomen Limited RUQ (LIVER/GB)  Result Date: 07/04/2021 CLINICAL DATA:  Abdominal pain x3 days EXAM: ULTRASOUND ABDOMEN LIMITED RIGHT UPPER QUADRANT COMPARISON:  CT July 02, 2021. FINDINGS: Gallbladder: Gallbladder is distended without gallstones or wall thickening visualized. No sonographic Murphy sign noted by sonographer. Common bile duct: Diameter: 4 mm Liver: No focal lesion identified. Within normal limits in parenchymal echogenicity. Portal vein is patent on color Doppler imaging with normal direction of blood flow towards the liver. Other: Suboptimal examination of the right upper quadrant secondary to patient habitus. IMPRESSION: No acute sonographic finding in the right upper quadrant on this examination which is suboptimal secondary to patient habitus. Electronically Signed    By: Dahlia Bailiff MD   On: 07/04/2021 16:29     Imaging independently reviewed in Epic.    Raynelle High[facility] for [facility]ious [facility] [phone] pager 07/06/2021, 9:17 AM

## 2021-07-06 NOTE — Consult Note (Signed)
Consult Note  Natalie Castro [birth year]  [id number].    Requesting MD: [place] Eric, DO Chief Complaint/Reason for Consult: possible cholecystitis  HPI:  80 yo female with medical history significant for acid reflux, osteoarthritis, colon adenomas, history of complete heart block with pacemaker placement, hypertension, history of PE, hyperlipidemia, type II DM who presented to [facility] ED on 7/7 due to precordial chest pain associated with dyspnea, palpitations, diaphoresis, nausea and one episode of emesis.  ED evaluation significant for afebrile, BP 142/98 mmHg, WBC 8.1, LFTs unremarkable except for mild hypoalbuminemia, Lipase 37 units/L. CTA chest/abdomen/pelvis showed moderate diffuse aortic calcification/atherosclerosis, but no evidence of PE, aortic aneurysm or dissection. Gallbladder is mildly distended without stone or wall thickening. No bile duct dilatation.  She was admitted to the hospitalist service and cardiology consulted for concern of ACS. She had a negative LHC showing non obstructive CAD  Since admission she developed fever on 7/10 and leukocytosis - 22.1 today. LFTs have remained normal except for hypoalbuminemia and AST low at 13. A RUS obtained 7/10 showed gallbladder distention without gallstones or wall thickening and no sonographic Murphy sign noted by sonographer with CBD 4 mm. Blood cultures x2 were ordered with 1 returning positive for E. Coli.  GI was consulted for possible ERCP but do not plan on proceeding with one at this time.  Currently she feels fatigued. No abdominal pain at rest but she does have right upper quadrant and epigastric pain with palpation. She denies nausea or emesis currently. She reports an approximately 1 year history of intermittent diarrhea and nausea every few weeks. She denies history of abdominal pain or postprandial symptoms. She has had some black stools in the past but none during admission. Last BM was today without melena or  hematochezia. ROS otherwise as below  Last colonoscopy in 2019 with ascending colon polyp; hepatic flexure polyps x2;transverse colon poly; hemorrhoids  Allergies: penicillins Blood thinners: eiliquis longterm for h/o unprovoked PE 2017. last dose of Eliquis on 7/10 at 0830 Past Surgeries: c section ~ 50 years ago  ROS: Review of Systems  Constitutional:  Positive for fever. Negative for chills.  Respiratory:  Negative for cough and shortness of breath.   Cardiovascular:  Positive for chest pain. Negative for palpitations, orthopnea and leg swelling.  Gastrointestinal:  Positive for abdominal pain, diarrhea, nausea and vomiting. Negative for blood in stool and constipation.  Genitourinary:  Negative for dysuria and hematuria.   Family History  Problem Relation Age of Onset   Heart failure Mother    Hypertension Mother    Heart failure Sister    Hypertension Father    Cancer Sister    Dementia Sister    Colon cancer Neg Hx    Colon polyps Neg Hx     Past Medical History:  Diagnosis Date   Acid reflux    Arthritis    Colon adenomas    AGE 79   Complete heart block (HCC)    STJ PPM Dr. 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, long term anticoag with apixaban    Hyperlipidemia    Myoview 03/2021    Myoview 4/22: EF 50, no infarct or ischemia; low risk   Presence of permanent cardiac pacemaker    Type 2 diabetes mellitus ([place])     Past Surgical History:  Procedure Laterality Date   COLONOSCOPY     3 SIMPLE ADENOMAS, AGE 58  COLONOSCOPY N/A 05/24/2018   Procedure: COLONOSCOPY;  Surgeon: Danie Binder, MD;  [facility];  Service: Endoscopy;  Laterality: N/A;  1:00pm   EP IMPLANTABLE DEVICE N/A 11/24/2015   Procedure: Pacemaker Implant;  Surgeon: [name], MD;  Location: [facility];  Service: Cardiovascular;  Laterality: N/A;   JOINT REPLACEMENT     knees bilat.   LEFT HEART CATH AND CORONARY  ANGIOGRAPHY N/A 07/02/2021   Procedure: LEFT HEART CATH AND CORONARY ANGIOGRAPHY;  Surgeon: Jettie Booze, MD;  Location: [facility];  Service: Cardiovascular;  Laterality: N/A;   POLYPECTOMY  05/24/2018   Procedure: POLYPECTOMY;  Surgeon: Danie Binder, MD;  [facility];  Service: Endoscopy;;  ascending and hepatic flexure, transverse   TUBAL LIGATION      Social History:  reports that she has never smoked. She has never used smokeless tobacco. She reports that she does not drink alcohol and does not use drugs.  Allergies:  Allergies  Allergen Reactions   Penicillins Hives, Itching and Other (See Comments)    Has tolerated ceftriaxone 7/22 Has patient had a PCN reaction causing immediate rash, facial/tongue/throat swelling, SOB or lightheadedness with hypotension: No Has patient had a PCN reaction causing severe rash involving mucus membranes or skin necrosis: Yes Has patient had a PCN reaction that required hospitalization No Has patient had a PCN reaction occurring within the last 10 years: No If all of the above answers are "NO", then may proceed with Cephalosporin use.     Medications Prior to Admission  Medication Sig Dispense Refill   atorvastatin (LIPITOR) 40 MG tablet TAKE (1) TABLET BY MOUTH AT BEDTIME. (Patient taking differently: Take 40 mg by mouth daily.) 30 tablet 0   ELIQUIS 5 MG TABS tablet TAKE 1 TABLET BY MOUTH TWICE A DAY. (Patient taking differently: Take 5 mg by mouth 2 (two) times daily.) 60 tablet 0   hydrALAZINE (APRESOLINE) 50 MG tablet Take 50 mg by mouth 3 (three) times daily.     LANTUS SOLOSTAR 100 UNIT/ML Solostar Pen Inject 30 Units into the skin at bedtime. 10 pen 2   losartan (COZAAR) 100 MG tablet TAKE (1) TABLET BY MOUTH ONCE DAILY. (Patient taking differently: Take 100 mg by mouth daily.) 30 tablet 0   metFORMIN (GLUCOPHAGE) 1000 MG tablet TAKE (1) TABLET BY MOUTH TWICE DAILY. (Patient taking differently: Take 1,000 mg by  mouth 2 (two) times daily with a meal.) 60 tablet 2   NIFEdipine (PROCARDIA-XL/NIFEDICAL-XL) 30 MG 24 hr tablet TAKE 1 TABLET BY MOUTH ONCE A DAY. (Patient taking differently: Take 30 mg by mouth daily.) 30 tablet 0   glucose blood test strip Use to check blood glucose fasting , before lunch and dinner and after your largest meal 120 each 12   Lancets (ONETOUCH ULTRASOFT) lancets Use to check glucose 4 x daily 120 each 12   nitroGLYCERIN (NITROSTAT) 0.4 MG SL tablet Place 1 tablet (0.4 mg total) under the tongue every 5 (five) minutes as needed for chest pain. (Patient not taking: Reported on 07/02/2021) 25 tablet 3   [DISCONTINUED] metoprolol succinate (TOPROL-XL) 25 MG 24 hr tablet Take 25 mg by mouth daily.      Blood pressure (!) 129/113, pulse 76, temperature 99.7 F (37.6 C), temperature source Oral, resp. rate (!) 25, height 5\' 5"  (1.651 m), weight 95.2 kg, SpO2 95 %. Physical Exam:  General: pleasant, WD, female who is laying in bed in NAD HEENT: head is  normocephalic, atraumatic.  Sclera are noninjected.  Pupils equal and round.  Ears and nose without any masses or lesions.  Mouth is pink and moist Heart: regular, rate, and rhythm.  Normal s1,s2. No obvious murmurs, gallops, or rubs noted.  Palpable radial and pedal pulses bilaterally Lungs: CTAB, no wheezes, rhonchi, or rales noted.  Respiratory effort nonlabored Abd: soft, ND, +BS, no masses, hernias, or organomegaly. Focal tenderness to palpation with guarding RUQ>epigastrium. Periumbilical midline incisional scar well healed.  MS: all 4 extremities are symmetrical with no cyanosis, clubbing, or edema. Skin: warm and dry with no masses, lesions, or rashes Neuro: Cranial nerves 2-12 grossly intact, sensation is normal throughout Psych: A&Ox3 with an appropriate affect.   Results for orders placed or performed during the hospital encounter of 07/01/21 (from the past 48 hour(s))  Lactic acid, plasma     Status: Abnormal   Collection  Time: 07/04/21 11:45 AM  Result Value Ref Range   Lactic Acid, Venous 2.3 (HH) 0.5 - 1.9 mmol/L    Comment: CRITICAL RESULT CALLED TO, READ BACK BY AND VERIFIED WITH: POONAM KUSHWAHA [email or web address]  ON 7.10.22 BY TCALDWELL MT. Performed at [facility], [address] [place], [place] [address]   Lipase, blood     Status: None   Collection Time: 07/04/21 11:45 AM  Result Value Ref Range   Lipase 23 11 - 51 U/L    Comment: Performed at [facility] [address], [place], [place] [address]  Glucose, capillary     Status: Abnormal   Collection Time: 07/04/21 12:22 PM  Result Value Ref Range   Glucose-Capillary 167 (H) 70 - 99 mg/dL    Comment: Glucose reference range applies only to samples taken after fasting for at least 8 hours.  Lactic acid, plasma     Status: None   Collection Time: 07/04/21  1:29 PM  Result Value Ref Range   Lactic Acid, Venous 1.3 0.5 - 1.9 mmol/L    Comment: Performed at [facility] [address], [place], [place] [address]  Glucose, capillary     Status: Abnormal   Collection Time: 07/04/21  4:39 PM  Result Value Ref Range   Glucose-Capillary 175 (H) 70 - 99 mg/dL    Comment: Glucose reference range applies only to samples taken after fasting for at least 8 hours.  Glucose, capillary     Status: Abnormal   Collection Time: 07/04/21  9:11 PM  Result Value Ref Range   Glucose-Capillary 182 (H) 70 - 99 mg/dL    Comment: Glucose reference range applies only to samples taken after fasting for at least 8 hours.  Magnesium     Status: None   Collection Time: 07/05/21  2:53 AM  Result Value Ref Range   Magnesium 1.9 1.7 - 2.4 mg/dL    Comment: Performed at [facility], [place] [address], [place], [place] [address]  CBC     Status: Abnormal   Collection Time: 07/05/21  6:33 AM  Result Value Ref Range   WBC 25.8 (H) 4.0 - 10.5 K/uL   RBC 3.85 (L) 3.87 - 5.11 MIL/uL   Hemoglobin 11.9 (L) 12.0 - 15.0 g/dL   HCT 36.3 36.0 - 46.0 %   MCV 94.3  80.0 - 100.0 fL   MCH 30.9 26.0 - 34.0 pg   MCHC 32.8 30.0 - 36.0 g/dL   RDW 14.9 11.5 - 15.5 %   Platelets 158 150 - 400 K/uL  nRBC 0.0 0.0 - 0.2 %    Comment: Performed at [facility], [place] [address], [place], [place] [address]  Basic metabolic panel     Status: Abnormal   Collection Time: 07/05/21  6:33 AM  Result Value Ref Range   Sodium 134 (L) 135 - 145 mmol/L   Potassium 3.9 3.5 - 5.1 mmol/L   Chloride 103 98 - 111 mmol/L   CO2 22 22 - 32 mmol/L   Glucose, Bld 201 (H) 70 - 99 mg/dL    Comment: Glucose reference range applies only to samples taken after fasting for at least 8 hours.   BUN 24 (H) 8 - 23 mg/dL   Creatinine, Ser 1.34 (H) 0.44 - 1.00 mg/dL   Calcium 8.3 (L) 8.9 - 10.3 mg/dL   GFR, Estimated 40 (L) >60 mL/min    Comment: (NOTE) Calculated using the CKD-EPI Creatinine Equation (2021)    Anion gap 9 5 - 15    Comment: Performed at [facility] [address] [place], [place] [address]  Hepatic function panel     Status: Abnormal   Collection Time: 07/05/21  6:33 AM  Result Value Ref Range   Total Protein 6.4 (L) 6.5 - 8.1 g/dL   Albumin 2.3 (L) 3.5 - 5.0 g/dL   AST 14 (L) 15 - 41 U/L   ALT 10 0 - 44 U/L   Alkaline Phosphatase 52 38 - 126 U/L   Total Bilirubin 1.2 0.3 - 1.2 mg/dL   Bilirubin, Direct 0.3 (H) 0.0 - 0.2 mg/dL   Indirect Bilirubin 0.9 0.3 - 0.9 mg/dL    Comment: Performed at [facility] [address] [place], [place] [address]  Glucose, capillary     Status: Abnormal   Collection Time: 07/05/21  7:44 AM  Result Value Ref Range   Glucose-Capillary 204 (H) 70 - 99 mg/dL    Comment: Glucose reference range applies only to samples taken after fasting for at least 8 hours.  Culture, Urine     Status: None   Collection Time: 07/05/21 12:20 PM   Specimen: Urine, Catheterized  Result Value Ref Range   Specimen Description URINE, CATHETERIZED    Special Requests NONE    Culture      NO GROWTH Performed at [facility], [address], [place], [place] [address]    Report Status 07/06/2021 FINAL   Glucose, capillary     Status: Abnormal   Collection Time: 07/05/21 12:45 PM  Result Value Ref Range   Glucose-Capillary 184 (H) 70 - 99 mg/dL    Comment: Glucose reference range applies only to samples taken after fasting for at least 8 hours.  Lactic acid, plasma     Status: None   Collection Time: 07/05/21  1:18 PM  Result Value Ref Range   Lactic Acid, Venous 1.3 0.5 - 1.9 mmol/L    Comment: Performed at [facility] [address] [place], [place] [address]  Procalcitonin - Baseline     Status: None   Collection Time: 07/05/21  1:18 PM  Result Value Ref Range   Procalcitonin 3.47 ng/mL    Comment:        Interpretation: PCT > 2 ng/mL: Systemic infection (sepsis) is likely, unless other causes are known. (NOTE)       Sepsis PCT Algorithm           Lower Respiratory Tract  Infection PCT Algorithm    ----------------------------     ----------------------------         PCT < 0.25 ng/mL                PCT < 0.10 ng/mL          Strongly encourage             Strongly discourage   discontinuation of antibiotics    initiation of antibiotics    ----------------------------     -----------------------------       PCT 0.25 - 0.50 ng/mL            PCT 0.10 - 0.25 ng/mL               OR       >80% decrease in PCT            Discourage initiation of                                            antibiotics      Encourage discontinuation           of antibiotics    ----------------------------     -----------------------------         PCT >= 0.50 ng/mL              PCT 0.26 - 0.50 ng/mL               AND       <80% decrease in PCT              Encourage initiation of                                             antibiotics       Encourage continuation           of antibiotics    ----------------------------     -----------------------------        PCT >=  0.50 ng/mL                  PCT > 0.50 ng/mL               AND         increase in PCT                  Strongly encourage                                      initiation of antibiotics    Strongly encourage escalation           of antibiotics                                     -----------------------------                                           PCT <= 0.25 ng/mL  OR                                        > 80% decrease in PCT                                      Discontinue / Do not initiate                                             antibiotics  Performed at [facility], [place] [address] [place], [place] [address]   Urinalysis, Routine w reflex microscopic Urine, Catheterized     Status: Abnormal   Collection Time: 07/05/21  2:28 PM  Result Value Ref Range   Color, Urine AMBER (A) YELLOW    Comment: BIOCHEMICALS MAY BE AFFECTED BY COLOR   APPearance HAZY (A) CLEAR   Specific Gravity, Urine 1.025 1.005 - 1.030   pH 5.0 5.0 - 8.0   Glucose, UA 150 (A) NEGATIVE mg/dL   Hgb urine dipstick SMALL (A) NEGATIVE   Bilirubin Urine NEGATIVE NEGATIVE   Ketones, ur NEGATIVE NEGATIVE mg/dL   Protein, ur 30 (A) NEGATIVE mg/dL   Nitrite NEGATIVE NEGATIVE   Leukocytes,Ua NEGATIVE NEGATIVE   RBC / HPF 11-20 0 - 5 RBC/hpf   WBC, UA 0-5 0 - 5 WBC/hpf   Bacteria, UA RARE (A) NONE SEEN   Squamous Epithelial / LPF 0-5 0 - 5   Mucus PRESENT    Hyaline Casts, UA PRESENT     Comment: Performed at [facility], [address], [place], [place] [address]  Lactic acid, plasma     Status: None   Collection Time: 07/05/21  3:52 PM  Result Value Ref Range   Lactic Acid, Venous 1.3 0.5 - 1.9 mmol/L    Comment: Performed at [facility] [address], [place], [place] [address]  Glucose, capillary     Status: Abnormal   Collection Time: 07/05/21  4:49 PM  Result Value Ref Range   Glucose-Capillary 150 (H) 70 - 99 mg/dL     Comment: Glucose reference range applies only to samples taken after fasting for at least 8 hours.  Glucose, capillary     Status: Abnormal   Collection Time: 07/05/21  9:37 PM  Result Value Ref Range   Glucose-Capillary 135 (H) 70 - 99 mg/dL    Comment: Glucose reference range applies only to samples taken after fasting for at least 8 hours.  CBC     Status: Abnormal   Collection Time: 07/06/21 12:50 AM  Result Value Ref Range   WBC 22.1 (H) 4.0 - 10.5 K/uL   RBC 3.77 (L) 3.87 - 5.11 MIL/uL   Hemoglobin 11.4 (L) 12.0 - 15.0 g/dL   HCT 35.8 (L) 36.0 - 46.0 %   MCV 95.0 80.0 - 100.0 fL   MCH 30.2 26.0 - 34.0 pg   MCHC 31.8 30.0 - 36.0 g/dL   RDW 14.9 11.5 - 15.5 %   Platelets 149 (L) 150 - 400 K/uL   nRBC 0.1 0.0 - 0.2 %    Comment: Performed at [facility], [place] [address] [place], [place] [address]  Comprehensive metabolic panel     Status: Abnormal   Collection Time: 07/06/21 12:50 AM  Result Value Ref Range   Sodium 136 135 - 145 mmol/L   Potassium 3.9 3.5 - 5.1 mmol/L   Chloride 106 98 - 111 mmol/L   CO2 24 22 - 32 mmol/L   Glucose, Bld 131 (H) 70 - 99 mg/dL    Comment: Glucose reference range applies only to samples taken after fasting for at least 8 hours.   BUN 25 (H) 8 - 23 mg/dL   Creatinine, Ser 1.14 (H) 0.44 - 1.00 mg/dL   Calcium 8.3 (L) 8.9 - 10.3 mg/dL   Total Protein 6.1 (L) 6.5 - 8.1 g/dL   Albumin 2.1 (L) 3.5 - 5.0 g/dL   AST 13 (L) 15 - 41 U/L   ALT 12 0 - 44 U/L   Alkaline Phosphatase 55 38 - 126 U/L   Total Bilirubin 1.0 0.3 - 1.2 mg/dL   GFR, Estimated 49 (L) >60 mL/min    Comment: (NOTE) Calculated using the CKD-EPI Creatinine Equation (2021)    Anion gap 6 5 - 15    Comment: Performed at [facility], [place] [address], [place], [place] [address]  Magnesium     Status: None   Collection Time: 07/06/21 12:50 AM  Result Value Ref Range   Magnesium 2.0 1.7 - 2.4 mg/dL    Comment: Performed at [facility], [place] [address],  [place],  [address]  Procalcitonin     Status: None   Collection Time: 07/06/21 12:50 AM  Result Value Ref Range   Procalcitonin 3.13 ng/mL    Comment:        Interpretation: PCT > 2 ng/mL: Systemic infection (sepsis) is likely, unless other causes are known. (NOTE)       Sepsis PCT Algorithm           Lower Respiratory Tract                                      Infection PCT Algorithm    ----------------------------     ----------------------------         PCT < 0.25 ng/mL                PCT < 0.10 ng/mL          Strongly encourage             Strongly discourage   discontinuation of antibiotics    initiation of antibiotics    ----------------------------     -----------------------------       PCT 0.25 - 0.50 ng/mL            PCT 0.10 - 0.25 ng/mL               OR       >80% decrease in PCT            Discourage initiation of                                            antibiotics      Encourage discontinuation           of antibiotics    ----------------------------     -----------------------------  PCT >= 0.50 ng/mL              PCT 0.26 - 0.50 ng/mL               AND       <80% decrease in PCT              Encourage initiation of                                             antibiotics       Encourage continuation           of antibiotics    ----------------------------     -----------------------------        PCT >= 0.50 ng/mL                  PCT > 0.50 ng/mL               AND         increase in PCT                  Strongly encourage                                      initiation of antibiotics    Strongly encourage escalation           of antibiotics                                     -----------------------------                                           PCT <= 0.25 ng/mL                                                 OR                                        > 80% decrease in PCT                                      Discontinue / Do not initiate                                              antibiotics  Performed at [facility], [address], [place], [place] [address]   Glucose, capillary     Status: Abnormal   Collection Time: 07/06/21  7:30 AM  Result Value Ref Range   Glucose-Capillary 123 (H) 70 - 99 mg/dL    Comment: Glucose reference range applies only to samples taken after fasting for at least 8  hours.  Glucose, capillary     Status: Abnormal   Collection Time: 07/06/21 11:15 AM  Result Value Ref Range   Glucose-Capillary 109 (H) 70 - 99 mg/dL    Comment: Glucose reference range applies only to samples taken after fasting for at least 8 hours.   US Abdomen Limited RUQ (LIVER/GB)  Result Date: 07/04/2021 CLINICAL DATA:  Abdominal pain x3 days EXAM: ULTRASOUND ABDOMEN LIMITED RIGHT UPPER QUADRANT COMPARISON:  CT July 02, 2021. FINDINGS: Gallbladder: Gallbladder is distended without gallstones or wall thickening visualized. No sonographic Murphy sign noted by sonographer. Common bile duct: Diameter: 4 mm Liver: No focal lesion identified. Within normal limits in parenchymal echogenicity. Portal vein is patent on color Doppler imaging with normal direction of blood flow towards the liver. Other: Suboptimal examination of the right upper quadrant secondary to patient habitus. IMPRESSION: No acute sonographic finding in the right upper quadrant on this examination which is suboptimal secondary to patient habitus. Electronically Signed   By: Dahlia Bailiff MD   On: 07/04/2021 16:29      Assessment/Plan RUQ abdominal pain  Distended gallbladder on CT Leukocytosis, fever CT scan shows distended gallbladder and she is tender to palpation over her right upper quadrant. She does not have postprandial symptoms. CT and [place] without cholelithiasis and LFTs WNL. Due to lack of cholelithiasis will obtain HIDA scan for further evaluation of possible acalculous cholecystitis.  - continue NPO while awaiting HIDA - agree with abx as per  ID  FEN: NPO ID: [email or web address] VTE: last eliquis 7/10  Per primary: E. coli septicemia, not POA  Atypical chest pain Nonobstructive CAD Essential hypertension Type 2 diabetes mellitus Hx complete heart block s/p [employer] PPM Hx of PE 2017, on chronic anticoagulation   Winferd Humphrey, [facility] Surgery 07/06/2021, 11:20 AM Please see Amion for pager number during day hours 7:00am-4:30pm

## 2021-07-06 NOTE — Telephone Encounter (Signed)
Placed in apt. Note. Patient has in-clinic check with A. Tillery, [place] on 07/22/21. Message sent to Dr. [name] and Dr. Lovena Le to let them know my follow up note.

## 2021-07-06 NOTE — Telephone Encounter (Signed)
-----   Message from Deboraha Sprang, MD sent at 07/03/2021 12:15 PM EDT ----- Hassell Halim and gents  this lady is programmed VIP with an AV delay on ECG of about 35o msec  maybe worth thinking of turning VIP off

## 2021-07-06 NOTE — Progress Notes (Signed)
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  Echocardiogram with LVEF 55-60% with hypokinesis of the distal inferior/inferior septal walls.  On 07/04/2021, patient was noted to have a fever up to 101.3 F with tachypnea.  TRH was consulted on 7/10 for further evaluation and work-up of fever with SIRS symptomatology.   Assessment & Plan:   Principal Problem:   Septicemia due to E. coli ([place]) Active Problems:   Essential hypertension, benign   Saddle pulmonary embolus (HCC)   Elevated troponin I level   Uncontrolled type 2 diabetes mellitus with hyperglycemia (HCC)   Acute coronary syndrome (HCC)   Protein-calorie malnutrition, mild (HCC)   Chest pain   Sepsis ([place])   Bacteremia   Acute kidney injury ([place])   Pancreatic duct dilated   E. coli septicemia, not POA On 07/04/2021, patient developed fever up to 101.3 F with associated tachypnea.  Patient with reported history of nausea/vomiting on presentation with associated chest pain.  Queried possibility of aspiration pneumonia.  Chest x-ray with no acute cardiopulmonary disease process.  Lipase 23, within normal limits.  Review of CT angiogram chest/abdomen/pelvis performed on 07/02/2021 at time of admission noted mild  gallbladder distention and dilation of the pancreatic duct.  Abdominal x-ray 7/10 with nonobstructive bowel gas pattern with recommendation of right upper quadrant ultrasound to exclude acute cholecystitis.  Right upper quadrant ultrasound 7/10 with gallbladder distention without gallstones or wall thickening and no sonographic Murphy sign noted by sonographer with CBD 4 mm.  Blood cultures x2 were ordered with 1 returning positive (anaerobic bottle) for E. coli.  LFTs and total bilirubin within normal limits.  Urinalysis and urine culture unrevealing, although obtained after initiation of IV antibiotics.  Seen by gastroenterology on 07/05/2021, for dilated pancreatic duct and Dr. [name] does not believe this is related to her E. coli bacteremia. --WBC 8.1>18.7>25.8>22.1 --procalcitonin 3.47>3.13 --ID following, appreciate assistance --Continues with fevers, T-max 101.5 past 24 hours --Ceftriaxone 2 g IV every 24 hours --Metronidazole 500 mg p.o. every 12 hours --Serial abdominal exams; remains tender to palpation right upper quadrant with guarding --CBC daily --CCS consult today for evaluation --N.p.o., last dose of Eliquis on 07/04/2021 at 0830  Atypical chest pain Nonobstructive CAD Patient initially presenting to [facility] with chest pain, transferred to [place] left heart catheterization with minimal nonobstructive CAD.  TTE with normal LVEF. --Atorvastatin 40 mg p.o. daily --Metoprolol succinate 25 mg p.o. daily --Home aspirin discontinued by cardiology, currently on treatment dose Lovenox until further infectious work-up complete and then will resume home Eliquis  Essential hypertension BP 111/54 this morning, well controlled.  Home regimen includes metoprolol succinate 25 mg p.o. daily, nifedipine 30 mg p.o. daily, losartan 100 mg p.o. daily, hydralazine 50 mg p.o. twice daily. --Metoprolol succinate 25 mg p.o. daily --hold remaining antihypertensives  including nifedipine, losartan, hydralazine --Continue monitor BP closely  Type 2 diabetes mellitus Home regimen includes metformin 1000 mg p.o. twice daily, Lantus 30 units subcutaneously nightly.  Hemoglobin A1c 7.4 07/02/2021, well controlled. 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  Device interrogated with prolonged AV timing, EP, Dr. [name] recommended reprogramming as outpatient.  Hx of PE 2017, on chronic anticoagulation On Eliquis outpatient.  Currently holding Eliquis in favor of treatment dose Lovenox in case surgical needs arise regarding gallbladder as etiology of her septicemia.  Hypomagnesemia: resolved Repleted with IV magnesium.  Magnesium 2.0 today --repeat electrolytes in a.m.   DVT prophylaxis:   Lovenox   Code Status: Full Code Family Communication: Patient's daughter present at bedside this morning  Disposition Plan:  Level of care: Telemetry Cardiac Status is: Inpatient  Remains inpatient appropriate because:Ongoing diagnostic testing needed not appropriate for outpatient work up, Unsafe d/c plan, IV treatments appropriate due to intensity of illness or inability to take PO, and Inpatient level of care appropriate due to severity of illness  Dispo: The patient is from: Home              Anticipated d/c is to: Home              Patient currently is not medically stable to d/c.   Difficult to place patient No  Consultants:  Cardiology - signed off 7/11 Infectious disease, Dr. Sylvie Farrier GI - signed off 7/11, Dr. [name] General surgery, Dr. [name]  Procedures:  Left heart catheterization  Antimicrobials:  Ceftriaxone 7/10>> Metronidazole 7/10>>    Subjective: Patient seen examined bedside, resting comfortably.  Continues with right-sided abdominal pain, especially with palpation  of right upper quadrant.  Continues with elevated WBC count and persistent fevers with T-max 101.5 over the past 24 hours.  Continues with nausea and poor appetite.  Concerned regarding gallbladder as etiology of her bacteremia, consulting general surgery; discussed with Dr. [name] who will reevaluate today.  Patient with no other questions or concerns at this time.  Denies headache, no dizziness, no chest pain, no shortness of breath, no current abdominal pain, no weakness, no fatigue, no paresthesias.  No acute events overnight per nursing staff.  Objective: Vitals:   07/06/21 0200 07/06/21 0342 07/06/21 0935 07/06/21 1006  BP:  (!) 111/54 (!) 129/113   Pulse:  76 76   Resp:      Temp: 99 F (37.2 C) 98.3 F (36.8 C)  99.7 F (37.6 C)  TempSrc:  Oral  Oral  SpO2:  95%    Weight:      Height:        Intake/Output Summary (Last 24 hours) at 07/06/2021 1119 Last data filed at 07/06/2021 0640 Gross per 24 hour  Intake 340 ml  Output 500 ml  Net -160 ml   Filed Weights   07/01/21 2245 07/02/21 0238  Weight: 96.2 kg 95.2 kg    Examination:  General exam: Appears calm and comfortable, ill in appearance Respiratory system: Clear to auscultation. Respiratory effort normal.  On room air Cardiovascular system: S1 & S2 heard, RRR. No JVD, murmurs, rubs, gallops or clicks. No pedal edema. Gastrointestinal system: Abdomen is nondistended, soft, tenderness right upper quadrant with mild guarding. No organomegaly or masses felt. Normal bowel sounds heard. Central nervous system: Alert and oriented. No focal neurological deficits. Extremities: Symmetric 5 x 5 power. Skin: No rashes, lesions or ulcers Psychiatry:  Judgement and insight appear normal. Mood & affect appropriate.     Data Reviewed: I have personally reviewed following labs and imaging studies  CBC: Recent Labs  Lab 07/01/21 2247 07/04/21 1028 07/05/21 0633 07/06/21 0050  WBC 8.1 18.7* 25.8* 22.1*  NEUTROABS  --   16.0*  --   --   HGB 12.4 12.2 11.9* 11.4*  HCT 39.7 37.3 36.3 35.8*  MCV 97.3 93.5 94.3 95.0  PLT 173 153 158 937*   Basic Metabolic Panel: Recent Labs  Lab 07/01/21 2247 07/02/21 1102 07/04/21 1028 07/05/21 0253 07/05/21 0633 07/06/21 0050  NA 139  --  134*  --  134* 136  K 3.9  --  3.6  --  3.9 3.9  CL 108  --  104  --  103 106  CO2 26  --  22  --  22 24  GLUCOSE 208*  --  184*  --  201* 131*  BUN 13  --  11  --  24* 25*  CREATININE 0.72  --  0.95  --  1.34* 1.14*  CALCIUM 8.6*  --  8.6*  --  8.3* 8.3*  MG  --  1.6* 1.5* 1.9  --  2.0   GFR: Estimated Creatinine Clearance: 45.7 mL/min (A) (by C-G formula based on SCr of 1.14 mg/dL (H)). Liver Function Tests: Recent Labs  Lab 07/01/21 2247 07/05/21 0633 07/06/21 0050  AST 12* 14* 13*  ALT 10 10 12   ALKPHOS 73 52 55  BILITOT 0.3 1.2 1.0  PROT 7.3 6.4* 6.1*  ALBUMIN 3.2* 2.3* 2.1*   Recent Labs  Lab 07/01/21 2247 07/04/21 1145  LIPASE 37 23   No results for input(s): AMMONIA in the last 168 hours. Coagulation Profile: No results for input(s): INR, PROTIME in the last 168 hours. Cardiac Enzymes: No results for input(s): CKTOTAL, CKMB, CKMBINDEX, TROPONINI in the last 168 hours. BNP (last 3 results) No results for input(s): PROBNP in the last 8760 hours. HbA1C: No results for input(s): HGBA1C in the last 72 hours. CBG: Recent Labs  Lab 07/05/21 1245 07/05/21 1649 07/05/21 2137 07/06/21 0730 07/06/21 1115  GLUCAP 184* 150* 135* 123* 109*   Lipid Profile: No results for input(s): CHOL, HDL, LDLCALC, TRIG, CHOLHDL, LDLDIRECT in the last 72 hours. Thyroid Function Tests: No results for input(s): TSH, T4TOTAL, FREET4, T3FREE, THYROIDAB in the last 72 hours. Anemia Panel: No results for input(s): VITAMINB12, FOLATE, FERRITIN, TIBC, IRON, RETICCTPCT in the last 72 hours. Sepsis Labs: Recent Labs  Lab 07/04/21 1145 07/04/21 1329 07/05/21 1318 07/05/21 1552 07/06/21 0050  PROCALCITON  --   --  3.47   --  3.13  LATICACIDVEN 2.3* 1.3 1.3 1.3  --     Recent Results (from the past 240 hour(s))  Resp Panel by RT-PCR (Flu A&B, Covid) Nasopharyngeal Swab     Status: None   Collection Time: 07/02/21  1:45 AM   Specimen: Nasopharyngeal Swab; Nasopharyngeal(NP) swabs in vial transport medium  Result Value Ref Range Status   SARS Coronavirus 2 by RT PCR NEGATIVE NEGATIVE Final    Comment: (NOTE) SARS-CoV-2 target nucleic acids are NOT DETECTED.  The SARS-CoV-2 RNA is generally detectable in upper respiratory specimens during the acute phase of infection. The lowest concentration of SARS-CoV-2 viral copies this assay can detect is 138 copies/mL. A negative result does not preclude SARS-Cov-2 infection and should not be used as the sole basis for treatment or other patient management decisions. A negative result may occur with  improper specimen collection/handling, submission of specimen other than nasopharyngeal swab, presence of viral mutation(s) within the areas targeted by this assay, and inadequate number of viral copies(<138 copies/mL). A negative result must be combined with clinical observations, patient history, and epidemiological information. The expected result is Negative.  Fact Sheet for Patients:  EntrepreneurPulse.com.au  Fact Sheet for Healthcare Providers:  IncredibleEmployment.be  This test is no t yet approved or cleared by the [place] FDA and  has been authorized for detection and/or diagnosis of SARS-CoV-2 by FDA under an Emergency Use Authorization (EUA). This EUA will remain  in effect (meaning this test can be used) for the duration of the COVID-19 declaration under Section 564(b)(1) of the Act, 21 U.S.C.section 360bbb-3(b)(1), unless the authorization is terminated  or revoked sooner.       Influenza A by PCR NEGATIVE NEGATIVE Final   Influenza B by PCR NEGATIVE NEGATIVE Final    Comment: (NOTE) The Xpert Xpress  SARS-CoV-2/FLU/RSV plus assay is intended as an aid in the diagnosis of influenza from Nasopharyngeal swab specimens and should not be used as a sole basis for treatment. Nasal washings and aspirates are unacceptable for Xpert Xpress SARS-CoV-2/FLU/RSV testing.  Fact Sheet for Patients: EntrepreneurPulse.com.au  Fact Sheet for Healthcare Providers: IncredibleEmployment.be  This test is not yet approved or cleared by the [place] FDA and has been authorized for detection and/or diagnosis of SARS-CoV-2 by FDA under an Emergency Use Authorization (EUA). This EUA will remain in effect (meaning this test can be used) for the duration of the COVID-19 declaration under Section 564(b)(1) of the Act, 21 U.S.C. section 360bbb-3(b)(1), unless the authorization is terminated or revoked.  Performed at [facility], [address] [place], [place] [address]   Culture, blood (routine x 2)     Status: None (Preliminary result)   Collection Time: 07/04/21 10:28 AM   Specimen: BLOOD RIGHT HAND  Result Value Ref Range Status   Specimen Description BLOOD RIGHT HAND  Final   Special Requests   Final    BOTTLES DRAWN AEROBIC ONLY Blood Culture results may not be optimal due to an inadequate volume of blood received in culture bottles   Culture   Final    NO GROWTH 2 DAYS Performed at [facility], [place] [address] [place], [place] [address]    Report Status PENDING  Incomplete  Culture, blood (routine x 2)     Status: Abnormal (Preliminary result)   Collection Time: 07/04/21 10:28 AM   Specimen: BLOOD LEFT HAND  Result Value Ref Range Status   Specimen Description BLOOD LEFT HAND  Final   Special Requests   Final    BOTTLES DRAWN AEROBIC ONLY Blood Culture adequate volume   Culture  Setup Time   Final    GRAM NEGATIVE RODS AEROBIC BOTTLE ONLY CRITICAL RESULT CALLED TO, READ BACK BY AND VERIFIED WITH: PHARMD MIRANDA BRYK BY MESSAN H. AT 0307 ON 7 11  2022    Culture (A)  Final    ESCHERICHIA COLI SUSCEPTIBILITIES TO FOLLOW Performed at [facility], [place] [address], [place], [place] [address]    Report Status PENDING  Incomplete  Blood Culture ID Panel (Reflexed)     Status: Abnormal   Collection Time: 07/04/21 10:28 AM  Result Value Ref Range Status   Enterococcus faecalis NOT DETECTED NOT DETECTED Final   Enterococcus Faecium NOT DETECTED NOT DETECTED Final   Listeria monocytogenes NOT DETECTED NOT DETECTED Final   Staphylococcus species NOT  DETECTED NOT DETECTED Final   Staphylococcus aureus (BCID) NOT DETECTED NOT DETECTED Final   Staphylococcus epidermidis NOT DETECTED NOT DETECTED Final   Staphylococcus lugdunensis NOT DETECTED NOT DETECTED Final   Streptococcus species NOT DETECTED NOT DETECTED Final   Streptococcus agalactiae NOT DETECTED NOT DETECTED Final   Streptococcus pneumoniae NOT DETECTED NOT DETECTED Final   Streptococcus pyogenes NOT DETECTED NOT DETECTED Final   A.calcoaceticus-baumannii NOT DETECTED NOT DETECTED Final   Bacteroides fragilis NOT DETECTED NOT DETECTED Final   Enterobacterales DETECTED (A) NOT DETECTED Final    Comment: Enterobacterales represent a large order of gram negative bacteria, not a single organism. CRITICAL RESULT CALLED TO, READ BACK BY AND VERIFIED WITH: PHARMD MIRANDA BRYK BY MESSAN H. AT [id number] ON 7 11 2022    Enterobacter cloacae complex NOT DETECTED NOT DETECTED Final   Escherichia coli DETECTED (A) NOT DETECTED Final    Comment: CRITICAL RESULT CALLED TO, READ BACK BY AND VERIFIED WITH: PHARMD MIRANDA BRYK BY MESSAN H. AT 0307 ON 7 11 2022    Klebsiella aerogenes NOT DETECTED NOT DETECTED Final   Klebsiella oxytoca NOT DETECTED NOT DETECTED Final   Klebsiella pneumoniae NOT DETECTED NOT DETECTED Final   Proteus species NOT DETECTED NOT DETECTED Final   Salmonella species NOT DETECTED NOT DETECTED Final   Serratia marcescens NOT DETECTED NOT DETECTED Final   Haemophilus  influenzae NOT DETECTED NOT DETECTED Final   Neisseria meningitidis NOT DETECTED NOT DETECTED Final   Pseudomonas aeruginosa NOT DETECTED NOT DETECTED Final   Stenotrophomonas maltophilia NOT DETECTED NOT DETECTED Final   Candida albicans NOT DETECTED NOT DETECTED Final   Candida auris NOT DETECTED NOT DETECTED Final   Candida glabrata NOT DETECTED NOT DETECTED Final   Candida krusei NOT DETECTED NOT DETECTED Final   Candida parapsilosis NOT DETECTED NOT DETECTED Final   Candida tropicalis NOT DETECTED NOT DETECTED Final   Cryptococcus neoformans/gattii NOT DETECTED NOT DETECTED Final   CTX-M ESBL NOT DETECTED NOT DETECTED Final   Carbapenem resistance IMP NOT DETECTED NOT DETECTED Final   Carbapenem resistance KPC NOT DETECTED NOT DETECTED Final   Carbapenem resistance NDM NOT DETECTED NOT DETECTED Final   Carbapenem resist OXA 48 LIKE NOT DETECTED NOT DETECTED Final   Carbapenem resistance VIM NOT DETECTED NOT DETECTED Final    Comment: Performed at [facility], [address] [place], [place] [address]  Culture, Urine     Status: None   Collection Time: 07/05/21 12:20 PM   Specimen: Urine, Catheterized  Result Value Ref Range Status   Specimen Description URINE, CATHETERIZED  Final   Special Requests NONE  Final   Culture   Final    NO GROWTH Performed at [facility], [address], [place], [place] [address]    Report Status 07/06/2021 FINAL  Final         Radiology Studies: US Abdomen Limited RUQ (LIVER/GB)  Result Date: 07/04/2021 CLINICAL DATA:  Abdominal pain x3 days EXAM: ULTRASOUND ABDOMEN LIMITED RIGHT UPPER QUADRANT COMPARISON:  CT July 02, 2021. FINDINGS: Gallbladder: Gallbladder is distended without gallstones or wall thickening visualized. No sonographic Murphy sign noted by sonographer. Common bile duct: Diameter: 4 mm Liver: No focal lesion identified. Within normal limits in parenchymal echogenicity. Portal vein is patent on color Doppler imaging  with normal direction of blood flow towards the liver. Other: Suboptimal examination of the right upper quadrant secondary to patient habitus. IMPRESSION: No acute sonographic finding in the right upper quadrant  on this examination which is suboptimal secondary to patient habitus. Electronically Signed   By: Dahlia Bailiff MD   On: 07/04/2021 16:29        Scheduled Meds:  atorvastatin  40 mg Oral Daily   enoxaparin (LOVENOX) injection  90 mg Subcutaneous Q12H   hydrALAZINE  50 mg Oral BID   insulin aspart  0-15 Units Subcutaneous TID WC   insulin glargine  20 Units Subcutaneous QHS   losartan  100 mg Oral Daily   metoprolol succinate  25 mg Oral Daily   metroNIDAZOLE  500 mg Oral Q12H   sodium chloride flush  3 mL Intravenous Q12H   Continuous Infusions:  sodium chloride     cefTRIAXone (ROCEPHIN)  IV 2 g (07/06/21 1113)     LOS: 4 days    Time spent: 39 minutes spent on chart review, discussion with nursing staff, consultants, updating family and interview/physical exam; more than 50% of that time was spent in counseling and/or coordination of care.    Trixy Loyola J [place] DO [facility] Available via Epic secure chat 7am-7pm After these hours, please refer to coverage provider listed on [email or web address] 07/06/2021, 11:19 AM

## 2021-07-07 ENCOUNTER — Inpatient Hospital Stay (HOSPITAL_COMMUNITY): Payer: Medicare Other

## 2021-07-07 DIAGNOSIS — R7881 Bacteremia: Secondary | ICD-10-CM | POA: Diagnosis not present

## 2021-07-07 DIAGNOSIS — A4151 Sepsis due to Escherichia coli [E. coli]: Secondary | ICD-10-CM | POA: Diagnosis not present

## 2021-07-07 LAB — COMPREHENSIVE METABOLIC PANEL
ALT: 9 U/L (ref 0–44)
AST: 12 U/L — ABNORMAL LOW (ref 15–41)
Albumin: 1.9 g/dL — ABNORMAL LOW (ref 3.5–5.0)
Alkaline Phosphatase: 50 U/L (ref 38–126)
Anion gap: 5 (ref 5–15)
BUN: 18 mg/dL (ref 8–23)
CO2: 24 mmol/L (ref 22–32)
Calcium: 7.9 mg/dL — ABNORMAL LOW (ref 8.9–10.3)
Chloride: 111 mmol/L (ref 98–111)
Creatinine, Ser: 0.93 mg/dL (ref 0.44–1.00)
GFR, Estimated: >60 mL/min (ref >60)
Glucose, Bld: 113 mg/dL — ABNORMAL HIGH (ref 70–99)
Potassium: 3.8 mmol/L (ref 3.5–5.1)
Sodium: 140 mmol/L (ref 135–145)
Total Bilirubin: 0.8 mg/dL (ref 0.3–1.2)
Total Protein: 6.1 g/dL — ABNORMAL LOW (ref 6.5–8.1)

## 2021-07-07 LAB — CBC
HCT: 34 % — ABNORMAL LOW (ref 36.0–46.0)
Hemoglobin: 11 g/dL — ABNORMAL LOW (ref 12.0–15.0)
MCH: 31.2 pg (ref 26.0–34.0)
MCHC: 32.4 g/dL (ref 30.0–36.0)
MCV: 96.3 fL (ref 80.0–100.0)
Platelets: 159 10*3/uL (ref 150–400)
RBC: 3.53 MIL/uL — ABNORMAL LOW (ref 3.87–5.11)
RDW: 15 % (ref 11.5–15.5)
WBC: 18.2 10*3/uL — ABNORMAL HIGH (ref 4.0–10.5)
nRBC: 0.1 % (ref 0.0–0.2)

## 2021-07-07 LAB — MAGNESIUM: Magnesium: 2.1 mg/dL (ref 1.7–2.4)

## 2021-07-07 LAB — CULTURE, BLOOD (ROUTINE X 2): Special Requests: ADEQUATE

## 2021-07-07 LAB — GLUCOSE, CAPILLARY
Glucose-Capillary: 112 mg/dL — ABNORMAL HIGH (ref 70–99)
Glucose-Capillary: 94 mg/dL (ref 70–99)
Glucose-Capillary: 165 mg/dL — ABNORMAL HIGH (ref 70–99)

## 2021-07-07 LAB — PROCALCITONIN: Procalcitonin: 1.58 ng/mL

## 2021-07-07 MED ORDER — TECHNETIUM TC 99M MEBROFENIN IV KIT
5.2000 | PACK | Freq: Once | INTRAVENOUS | Status: AC | PRN
  Administered 2021-07-07: 5.2 via INTRAVENOUS

## 2021-07-07 MED ORDER — MORPHINE BOLUS VIA INFUSION: 3.0000 mg | Freq: Once | INTRAVENOUS | Status: DC

## 2021-07-07 MED ORDER — MORPHINE SULFATE (PF) 4 MG/ML IV SOLN
3.0000 mg | Freq: Once | INTRAVENOUS | Status: AC
  Administered 2021-07-07: 3 mg via INTRAVENOUS
  Filled 2021-07-07: qty 1

## 2021-07-07 MED ORDER — CEFAZOLIN SODIUM-DEXTROSE 2-4 GM/100ML-% IV SOLN
2.0000 g | Freq: Three times a day (TID) | INTRAVENOUS | Status: DC
  Administered 2021-07-07 – 2021-07-09 (×6): 2 g via INTRAVENOUS
  Filled 2021-07-07 (×7): qty 100

## 2021-07-07 NOTE — Progress Notes (Signed)
IR approved request for Percutaneous cholecystostomy. Patient on the schedule for 7/14. NPO at midnight; hold Lovenox. Formal consult to follow.  Soyla Dryer, [place] [phone] 07/07/2021, 4:47 PM

## 2021-07-07 NOTE — Progress Notes (Signed)
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  Echocardiogram with LVEF 55-60% with hypokinesis of the distal inferior/inferior septal walls.   On 07/04/2021, patient was noted to have Latrica Castro fever up to 101.3 F with tachypnea.  TRH was consulted on 7/10 for further evaluation and work-up of fever with SIRS symptomatology.  Assessment & Plan:   Principal Problem:   Septicemia due to E. coli ([place]) Active Problems:   Essential hypertension, benign   Saddle pulmonary embolus (HCC)   Elevated troponin I level   Uncontrolled type 2 diabetes mellitus with hyperglycemia (HCC)   Acute coronary syndrome (HCC)   Protein-calorie malnutrition, mild (HCC)   Chest pain   Sepsis ([place])   Bacteremia   Acute kidney injury ([place])   Pancreatic duct dilated  E. coli Bacteremia  Acute Cholecystitis On 07/04/2021, patient developed fever up to 101.3 F with associated tachypnea.  Patient with reported history of nausea/vomiting on presentation with associated chest pain.  Queried possibility of aspiration pneumonia.  Chest x-ray with no acute cardiopulmonary disease process.  Lipase 23, within normal limits.  Review of CT angiogram  chest/abdomen/pelvis performed on 07/02/2021 at time of admission noted mild gallbladder distention and dilation of the pancreatic duct.  Abdominal x-ray 7/10 with nonobstructive bowel gas pattern with recommendation of right upper quadrant ultrasound to exclude acute cholecystitis.  Right upper quadrant ultrasound 7/10 with gallbladder distention without gallstones or wall thickening and no sonographic Murphy sign noted by sonographer with CBD 4 mm.  Blood cultures x2 were ordered with 1 returning positive (anaerobic bottle) for E. coli.  LFTs and total bilirubin within normal limits.  Urinalysis and urine culture unrevealing, although obtained after initiation of IV antibiotics.  Seen by gastroenterology on 07/05/2021, for dilated pancreatic duct and Natalie Castro does not believe this is related to her E. coli bacteremia. - HIDA scan done today and positive for cholecystitis - plan for perc chole per surgery --ID following, appreciate assistance - narrowed to ancef and flagyl  --fever 7/13 AM, follow with continued abx --clears, NPO at midnight --eliquis currently on hold    Atypical chest pain Nonobstructive CAD Patient initially presenting to [facility] with chest pain, transferred to [facility] left heart catheterization with minimal nonobstructive CAD.  TTE with normal LVEF, but hypokinesis of distal inferior/inferoseptal walls. --Atorvastatin 40 mg p.o. daily --Metoprolol succinate 25 mg p.o. daily --Home aspirin discontinued by cardiology, currently on treatment dose Lovenox until further infectious work-up complete and then will resume home Eliquis   Essential hypertension BP appropriate today.  Home regimen includes metoprolol succinate 25 mg p.o. daily, nifedipine 30 mg p.o. daily, losartan 100 mg p.o. daily, hydralazine 50 mg p.o. twice daily. --Metoprolol succinate 25 mg p.o. daily, losartan 100 mg daily, hydralazine 50 mg BID --hold remaining  antihypertensives including nifedipine --Continue monitor BP closely   Type 2 diabetes mellitus Home regimen includes metformin 1000 mg p.o. twice daily, Lantus 30 units subcutaneously nightly.  Hemoglobin A1c 7.4 07/02/2021, well controlled. 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  Device interrogated with prolonged AV timing, EP, Natalie Castro recommended reprogramming as outpatient.   Hx of PE 2017, on chronic anticoagulation On Eliquis outpatient.  Currently holding Eliquis in favor of treatment dose Lovenox in case surgical needs arise regarding gallbladder as etiology of her septicemia.   Hypomagnesemia: resolved  DVT prophylaxis:lovenox, anticoagulatin dose Code Status: full  Family Communication: family at bedside Disposition:   Status is: Inpatient  Remains inpatient appropriate because:Inpatient level of care appropriate due to severity of illness  Dispo: The patient is from: Home              Anticipated d/c is to: Home              Patient currently is not medically stable to d/c.   Difficult to place patient No       Consultants:  Cardiology GI ID surgery  Procedures:  Cath The left ventricular systolic function is normal. LV end diastolic pressure is mildly elevated. LVEDP 17 mm Hg. The left ventricular ejection fraction is 55-65% by visual estimate. There is no aortic valve stenosis. Mild nonobstructive coroanry artery disease. Tortuous vessels. Catheter induced spasm of the ostial RCA.   Mild radial vasospasm.  Continue preventive therapy.  Low level troponin elevation may be related to her rhythm disturbance noted on ECG.    IMPRESSIONS     1. Hypokinesis of the distal inferior/inferoseptal walls. Changes appear  more prominent than in previous echo in 2017. Marland Kitchen Left ventricular ejection   fraction, by estimation, is 55 to 60%. The left ventricle has normal  function. There is moderate left  ventricular hypertrophy. Elevated left atrial pressure.   2. Right ventricular systolic function is normal. The right ventricular  size is normal. There is normal pulmonary artery systolic pressure.   3. The mitral valve is normal in structure. Mild to moderate mitral valve  regurgitation.   4. The aortic valve is tricuspid. Aortic valve regurgitation is not  visualized. Mild to moderate aortic valve sclerosis/calcification is  present, without any evidence of aortic stenosis.    Antimicrobials:  Anti-infectives (From admission, onward)    Start     Dose/Rate Route Frequency Ordered Stop   07/07/21 1200  ceFAZolin (ANCEF) IVPB 2g/100 mL premix        2 g 200 mL/hr over 30 Minutes Intravenous Every 8 hours 07/07/21 0915     07/05/21 2200  metroNIDAZOLE (FLAGYL) tablet 500 mg        500 mg Oral Every 12 hours 07/05/21 1258     07/05/21 1200  cefTRIAXone (ROCEPHIN) 2 g in sodium chloride 0.9 % 100 mL IVPB  Status:  Discontinued        2 g 200 mL/hr over 30 Minutes Intravenous Every 24 hours 07/05/21 1054 07/07/21 0915   07/04/21 1245  cefTRIAXone (ROCEPHIN) 2 g in sodium chloride 0.9 % 100 mL IVPB  Status:  Discontinued        2 g 200 mL/hr over 30 Minutes Intravenous Every 24 hours 07/04/21 1145 07/05/21 0759   07/04/21 1245  metroNIDAZOLE (FLAGYL) IVPB 500 mg  Status:  Discontinued        500 mg 100 mL/hr over 60 Minutes Intravenous Every 8 hours 07/04/21 1145 07/05/21 0759          Subjective: C/o abdominal pain  Objective: Vitals:   07/06/21 2113 07/07/21 0009 07/07/21 0425 07/07/21 1407  BP: 132/60 (!) 144/61 (!) 152/61 131/63  Pulse:  86 79 76  Resp:  18 18 20   Temp:  100.1 F (37.8 C) (!) 100.5 F (38.1 C)   TempSrc:  Oral Oral   SpO2:  94% 92%   Weight:      Height:        Intake/Output Summary (Last 24 hours) at 07/07/2021 1620 Last data filed at  07/07/2021 0700 Gross per 24 hour  Intake 1295.55 ml  Output 750 ml  Net 545.55 ml   Filed Weights   07/01/21 2245 07/02/21 0238  Weight: 96.2 kg 95.2 kg    Examination:  General exam: Appears calm and comfortable  Respiratory system: Clear to auscultation. Respiratory effort normal. Cardiovascular system: RRR Gastrointestinal system: RUQ abdominal discomfort Central nervous system: Alert and oriented. No focal neurological deficits. Extremities: no LEE Skin: No rashes, lesions or ulcers Psychiatry: Judgement and insight appear normal. Mood & affect appropriate.     Data Reviewed: I have personally reviewed following labs and imaging studies  CBC: Recent Labs  Lab 07/01/21 2247 07/04/21 1028 07/05/21 0633 07/06/21 0050 07/07/21 0650  WBC 8.1 18.7* 25.8* 22.1* 18.2*  NEUTROABS  --  16.0*  --   --   --   HGB 12.4 12.2 11.9* 11.4* 11.0*  HCT 39.7 37.3 36.3 35.8* 34.0*  MCV 97.3 93.5 94.3 95.0 96.3  PLT 173 153 158 149* 790    Basic Metabolic Panel: Recent Labs  Lab 07/01/21 2247 07/02/21 1102 07/04/21 1028 07/05/21 0253 07/05/21 0633 07/06/21 0050 07/07/21 0650  NA 139  --  134*  --  134* 136 140  K 3.9  --  3.6  --  3.9 3.9 3.8  CL 108  --  104  --  103 106 111  CO2 26  --  22  --  22 24 24   GLUCOSE 208*  --  184*  --  201* 131* 113*  BUN 13  --  11  --  24* 25* 18  CREATININE 0.72  --  0.95  --  1.34* 1.14* 0.93  CALCIUM 8.6*  --  8.6*  --  8.3* 8.3* 7.9*  MG  --  1.6* 1.5* 1.9  --  2.0 2.1    GFR: Estimated Creatinine Clearance: 56 mL/min (by C-G formula based on SCr of 0.93 mg/dL).  Liver Function Tests: Recent Labs  Lab 07/01/21 2247 07/05/21 0633 07/06/21 0050 07/07/21 0650  AST 12* 14* 13* 12*  ALT 10 10 12 9   ALKPHOS 73 52 55 50  BILITOT 0.3 1.2 1.0 0.8  PROT 7.3 6.4* 6.1* 6.1*  ALBUMIN 3.2* 2.3* 2.1* 1.9*    CBG: Recent Labs  Lab 07/06/21 1115 07/06/21 1608 07/06/21 2111 07/07/21 0736 07/07/21 1356  GLUCAP 109* 103* 142*  112* 94     Recent Results (from the past 240 hour(s))  Resp Panel by RT-PCR (Flu Jayleen Afonso&B, Covid) Nasopharyngeal Swab     Status: None   Collection Time: 07/02/21  1:45 AM   Specimen: Nasopharyngeal Swab; Nasopharyngeal(NP) swabs in vial transport medium  Result Value Ref Range Status   SARS Coronavirus 2 by RT PCR NEGATIVE NEGATIVE Final    Comment: (NOTE) SARS-CoV-2  target nucleic acids are NOT DETECTED.  The SARS-CoV-2 RNA is generally detectable in upper respiratory specimens during the acute phase of infection. The lowest concentration of SARS-CoV-2 viral copies this assay can detect is 138 copies/mL. Jonanthan Bolender negative result does not preclude SARS-Cov-2 infection and should not be used as the sole basis for treatment or other patient management decisions. Shikira Folino negative result may occur with  improper specimen collection/handling, submission of specimen other than nasopharyngeal swab, presence of viral mutation(s) within the areas targeted by this assay, and inadequate number of viral copies(<138 copies/mL). Livier Hendel negative result must be combined with clinical observations, patient history, and epidemiological information. The expected result is Negative.  Fact Sheet for Patients:  EntrepreneurPulse.com.au  Fact Sheet for Healthcare Providers:  IncredibleEmployment.be  This test is no t yet approved or cleared by the [place] FDA and  has been authorized for detection and/or diagnosis of SARS-CoV-2 by FDA under an Emergency Use Authorization (EUA). This EUA will remain  in effect (meaning this test can be used) for the duration of the COVID-19 declaration under Section 564(b)(1) of the Act, 21 U.S.C.section 360bbb-3(b)(1), unless the authorization is terminated  or revoked sooner.       Influenza Lizvet Chunn by PCR NEGATIVE NEGATIVE Final   Influenza B by PCR NEGATIVE NEGATIVE Final    Comment: (NOTE) The Xpert Xpress SARS-CoV-2/FLU/RSV plus assay is  intended as an aid in the diagnosis of influenza from Nasopharyngeal swab specimens and should not be used as Caitlyne Ingham sole basis for treatment. Nasal washings and aspirates are unacceptable for Xpert Xpress SARS-CoV-2/FLU/RSV testing.  Fact Sheet for Patients: EntrepreneurPulse.com.au  Fact Sheet for Healthcare Providers: IncredibleEmployment.be  This test is not yet approved or cleared by the [place] FDA and has been authorized for detection and/or diagnosis of SARS-CoV-2 by FDA under an Emergency Use Authorization (EUA). This EUA will remain in effect (meaning this test can be used) for the duration of the COVID-19 declaration under Section 564(b)(1) of the Act, 21 U.S.C. section 360bbb-3(b)(1), unless the authorization is terminated or revoked.  Performed at [facility], [address] [place], [place] [address]   Culture, blood (routine x 2)     Status: None (Preliminary result)   Collection Time: 07/04/21 10:28 AM   Specimen: BLOOD RIGHT HAND  Result Value Ref Range Status   Specimen Description BLOOD RIGHT HAND  Final   Special Requests   Final    BOTTLES DRAWN AEROBIC ONLY Blood Culture results may not be optimal due to an inadequate volume of blood received in culture bottles   Culture   Final    NO GROWTH 3 DAYS Performed at [facility], [place] [address], [place], [place] [address]    Report Status PENDING  Incomplete  Culture, blood (routine x 2)     Status: Abnormal   Collection Time: 07/04/21 10:28 AM   Specimen: BLOOD LEFT HAND  Result Value Ref Range Status   Specimen Description BLOOD LEFT HAND  Final   Special Requests   Final    BOTTLES DRAWN AEROBIC ONLY Blood Culture adequate volume   Culture  Setup Time   Final    GRAM NEGATIVE RODS AEROBIC BOTTLE ONLY CRITICAL RESULT CALLED TO, READ BACK BY AND VERIFIED WITH: [place] BY [place]. AT 3149 ON 7 11 2022 Performed at [facility], [address] [place], [place] [address]    Culture ESCHERICHIA COLI (Davette Nugent)  Final   Report Status 07/07/2021  FINAL  Final   Organism ID, Bacteria ESCHERICHIA COLI  Final      Susceptibility   Escherichia coli - MIC*    AMPICILLIN 8 SENSITIVE Sensitive     CEFAZOLIN <=4 SENSITIVE Sensitive     CEFEPIME <=0.12 SENSITIVE Sensitive     CEFTAZIDIME <=1 SENSITIVE Sensitive     CEFTRIAXONE <=0.25 SENSITIVE Sensitive     CIPROFLOXACIN <=0.25 SENSITIVE Sensitive     GENTAMICIN <=1 SENSITIVE Sensitive     IMIPENEM <=0.25 SENSITIVE Sensitive     TRIMETH/SULFA <=20 SENSITIVE Sensitive     AMPICILLIN/SULBACTAM 4 SENSITIVE Sensitive     PIP/TAZO <=4 SENSITIVE Sensitive     * ESCHERICHIA COLI  Blood Culture ID Panel (Reflexed)     Status: Abnormal   Collection Time: 07/04/21 10:28 AM  Result Value Ref Range Status   Enterococcus faecalis NOT DETECTED NOT DETECTED Final   Enterococcus Faecium NOT DETECTED NOT DETECTED Final   Listeria monocytogenes NOT DETECTED NOT DETECTED Final   Staphylococcus species NOT DETECTED NOT DETECTED Final   Staphylococcus aureus (BCID) NOT DETECTED NOT DETECTED Final   Staphylococcus epidermidis NOT DETECTED NOT DETECTED Final   Staphylococcus lugdunensis NOT DETECTED NOT DETECTED Final   Streptococcus species NOT DETECTED NOT DETECTED Final   Streptococcus agalactiae NOT DETECTED NOT DETECTED Final   Streptococcus pneumoniae NOT DETECTED NOT DETECTED Final   Streptococcus pyogenes NOT DETECTED NOT DETECTED Final   Gevorg Brum.calcoaceticus-baumannii NOT DETECTED NOT DETECTED Final   Bacteroides fragilis NOT DETECTED NOT DETECTED Final   Enterobacterales DETECTED (Edel Rivero) NOT DETECTED Final    Comment: Enterobacterales represent Zarah Carbon large order of gram negative bacteria, not Allahna Husband single organism. CRITICAL RESULT CALLED TO, READ BACK BY AND VERIFIED WITH: PHARMD MIRANDA BRYK BY MESSAN H. AT 8937 ON 7 11 2022    Enterobacter cloacae complex NOT DETECTED NOT DETECTED Final   Escherichia coli DETECTED  (Jakera Beaupre) NOT DETECTED Final    Comment: CRITICAL RESULT CALLED TO, READ BACK BY AND VERIFIED WITH: PHARMD MIRANDA BRYK BY MESSAN H. AT 0307 ON 7 11 2022    Klebsiella aerogenes NOT DETECTED NOT DETECTED Final   Klebsiella oxytoca NOT DETECTED NOT DETECTED Final   Klebsiella pneumoniae NOT DETECTED NOT DETECTED Final   Proteus species NOT DETECTED NOT DETECTED Final   Salmonella species NOT DETECTED NOT DETECTED Final   Serratia marcescens NOT DETECTED NOT DETECTED Final   Haemophilus influenzae NOT DETECTED NOT DETECTED Final   Neisseria meningitidis NOT DETECTED NOT DETECTED Final   Pseudomonas aeruginosa NOT DETECTED NOT DETECTED Final   Stenotrophomonas maltophilia NOT DETECTED NOT DETECTED Final   Candida albicans NOT DETECTED NOT DETECTED Final   Candida auris NOT DETECTED NOT DETECTED Final   Candida glabrata NOT DETECTED NOT DETECTED Final   Candida krusei NOT DETECTED NOT DETECTED Final   Candida parapsilosis NOT DETECTED NOT DETECTED Final   Candida tropicalis NOT DETECTED NOT DETECTED Final   Cryptococcus neoformans/gattii NOT DETECTED NOT DETECTED Final   CTX-M ESBL NOT DETECTED NOT DETECTED Final   Carbapenem resistance IMP NOT DETECTED NOT DETECTED Final   Carbapenem resistance KPC NOT DETECTED NOT DETECTED Final   Carbapenem resistance NDM NOT DETECTED NOT DETECTED Final   Carbapenem resist OXA 48 LIKE NOT DETECTED NOT DETECTED Final   Carbapenem resistance VIM NOT DETECTED NOT DETECTED Final    Comment: Performed at [facility], [address] [place], [place] [address]  Culture, Urine     Status: None   Collection Time: 07/05/21  12:20 PM   Specimen: Urine, Catheterized  Result Value Ref Range Status   Specimen Description URINE, CATHETERIZED  Final   Special Requests NONE  Final   Culture   Final    NO GROWTH Performed at [facility], [address], [place], [place] [address]    Report Status 07/06/2021 FINAL  Final  Culture, blood (routine x 2)      Status: None (Preliminary result)   Collection Time: 07/06/21  9:24 AM   Specimen: BLOOD  Result Value Ref Range Status   Specimen Description BLOOD LEFT ANTECUBITAL  Final   Special Requests   Final    BOTTLES DRAWN AEROBIC AND ANAEROBIC Blood Culture results may not be optimal due to an excessive volume of blood received in culture bottles   Culture   Final    NO GROWTH < 24 HOURS Performed at [facility], [place] [address] [place], [place] [address]    Report Status PENDING  Incomplete  Culture, blood (routine x 2)     Status: None (Preliminary result)   Collection Time: 07/06/21  9:30 AM   Specimen: BLOOD RIGHT FOREARM  Result Value Ref Range Status   Specimen Description BLOOD RIGHT FOREARM  Final   Special Requests   Final    BOTTLES DRAWN AEROBIC AND ANAEROBIC Blood Culture adequate volume   Culture   Final    NO GROWTH < 24 HOURS Performed at [facility], [place] [address] [place], [place] [address]    Report Status PENDING  Incomplete         Radiology Studies: NM Hepato W/EF  Result Date: 07/07/2021 CLINICAL DATA:  Concern for acalculous cholecystitis EXAM: NUCLEAR MEDICINE HEPATOBILIARY IMAGING TECHNIQUE: Sequential images of the abdomen were obtained out to 60 minutes following intravenous administration of radiopharmaceutical. An additional 60 minutes of scintigraphic imaging was performed after the administration of 3 mg of morphine. RADIOPHARMACEUTICALS:  5.2 mCi Tc-45m  Choletec IV COMPARISON:  Ultrasound July 04, 2021. FINDINGS: Prompt uptake and biliary excretion of activity by the liver is seen. No gallbladder activity is visualized at 60 minutes or after following administration of morphine. Biliary activity passes into small bowel, consistent with patent common bile duct. IMPRESSION: Scintigraphic findings consistent with acute cholecystitis. Patent common bile duct. These results will be called to the ordering clinician or representative by the  Radiologist Assistant, and communication documented in the PACS or [employer]. Electronically Signed   By: Dahlia Bailiff MD   On: 07/07/2021 15:23        Scheduled Meds:  atorvastatin  40 mg Oral Daily   enoxaparin (LOVENOX) injection  90 mg Subcutaneous Q12H   hydrALAZINE  50 mg Oral BID   insulin aspart  0-15 Units Subcutaneous TID WC   insulin glargine  20 Units Subcutaneous QHS   losartan  100 mg Oral Daily   metoprolol succinate  25 mg Oral Daily   metroNIDAZOLE  500 mg Oral Q12H   sodium chloride flush  3 mL Intravenous Q12H   Continuous Infusions:  sodium chloride     sodium chloride 75 mL/hr at 07/07/21 0700    ceFAZolin (ANCEF) IV 2 g (07/07/21 1359)     LOS: 5 days    Time spent: over 30 min    Fayrene Helper, MD [facility]   To contact the attending provider between 7A-7P or the covering provider during after hours 7P-7A, please log into the web site [email or web address] and access using  universal [place] password for that web site. If you do not have the password, please call the hospital operator.  07/07/2021, 4:20 PM

## 2021-07-07 NOTE — Progress Notes (Addendum)
[facility] Castro for Infectious Disease  Date of Admission:  07/01/2021           Reason for visit: Follow up on E. coli bacteremia  Current antibiotics: Ceftriaxone and metronidazole 7/10--present  ASSESSMENT:    80 year old woman with E. coli bacteremia likely secondary to a GI/biliary source.  She was evaluated by GI without any further interventions planned.  General surgery saw her yesterday and is planning for a HIDA scan to further evaluate which is pending.  She continues to be febrile but otherwise stable on ceftriaxone and metronidazole.  PLAN:    Continue antibiotics.  Will narrow to cefazolin based on cx and continue with metronidazole Follow-up HIDA scan and surgery plans Follow-up repeat blood cultures which at this point are no growth to date Will follow up   Principal Problem:   Septicemia due to E. coli ([place]) Active Problems:   Essential hypertension, benign   Saddle pulmonary embolus (HCC)   Elevated troponin I level   Uncontrolled type 2 diabetes mellitus with hyperglycemia (HCC)   Acute coronary syndrome (HCC)   Protein-calorie malnutrition, mild (HCC)   Chest pain   Sepsis ([place])   Bacteremia   Acute kidney injury ([place])   Pancreatic duct dilated    MEDICATIONS:    Scheduled Meds:  atorvastatin  40 mg Oral Daily   enoxaparin (LOVENOX) injection  90 mg Subcutaneous Q12H   hydrALAZINE  50 mg Oral BID   insulin aspart  0-15 Units Subcutaneous TID WC   insulin glargine  20 Units Subcutaneous QHS   losartan  100 mg Oral Daily   metoprolol succinate  25 mg Oral Daily   metroNIDAZOLE  500 mg Oral Q12H   sodium chloride flush  3 mL Intravenous Q12H   Continuous Infusions:  sodium chloride     sodium chloride 75 mL/hr at 07/07/21 0700    ceFAZolin (ANCEF) IV     PRN Meds:.sodium chloride, acetaminophen **OR** acetaminophen, morphine injection, nitroGLYCERIN, ondansetron **OR** ondansetron (ZOFRAN) IV, prochlorperazine, sodium chloride  flush  SUBJECTIVE:   24 hour events:  No acute events noted T-max 101.5 WBC improved  Patient without new complaints.  She reports continued abdominal pain and that she is hungry.  Review of Systems  All other systems reviewed and are negative.    OBJECTIVE:   Blood pressure (!) 152/61, pulse 79, temperature (!) 100.5 F (38.1 C), temperature source Oral, resp. rate 18, height 5\' 5"  (1.651 m), weight 95.2 kg, SpO2 92 %. Body mass index is 34.93 kg/m.  Physical Exam Constitutional:      General: She is not in acute distress.    Appearance: Normal appearance.  Pulmonary:     Effort: Pulmonary effort is normal. No respiratory distress.  Abdominal:     Palpations: Abdomen is soft.     Tenderness: There is abdominal tenderness. There is guarding.  Skin:    General: Skin is warm and dry.     Coloration: Skin is not jaundiced.  Neurological:     General: No focal deficit present.     Mental Status: She is alert and oriented to person, place, and time.  Psychiatric:        Mood and Affect: Mood normal.        Behavior: Behavior normal.     Lab Results: Lab Results  Component Value Date   WBC 18.2 (H) 07/07/2021   HGB 11.0 (L) 07/07/2021   HCT 34.0 (L) 07/07/2021   MCV 96.3  07/07/2021   PLT 159 07/07/2021    Lab Results  Component Value Date   NA 140 07/07/2021   K 3.8 07/07/2021   CO2 24 07/07/2021   GLUCOSE 113 (H) 07/07/2021   BUN 18 07/07/2021   CREATININE 0.93 07/07/2021   CALCIUM 7.9 (L) 07/07/2021   GFRNONAA >60 07/07/2021   GFRAA 91 06/30/2020    Lab Results  Component Value Date   ALT 9 07/07/2021   AST 12 (L) 07/07/2021   ALKPHOS 50 07/07/2021   BILITOT 0.8 07/07/2021    No results found for: CRP  No results found for: ESRSEDRATE   I have reviewed the micro and lab results in Epic.  Imaging: No results found.   Imaging independently reviewed in Epic.    Natalie Castro for Infectious Disease [facility]  Group [phone] pager 07/07/2021, 11:30 AM

## 2021-07-07 NOTE — Progress Notes (Signed)
5 Days Post-Op  Subjective: CC: Continues to have diffuse upper abdominal pain. Awaiting HIDA  Objective: Vital signs in last 24 hours: Temp:  [99.5 F (37.5 C)-100.5 F (38.1 C)] 100.5 F (38.1 C) (07/13 0425) Pulse Rate:  [76-86] 79 (07/13 0425) Resp:  [18-25] 18 (07/13 0425) BP: (101-152)/(40-113) 152/61 (07/13 0425) SpO2:  [92 %-95 %] 92 % (07/13 0425) Last BM Date: 07/06/21  Intake/Output from previous day: 07/12 0701 - 07/13 0700 In: 1459.6 [P.O.:120; I.V.:1239.6; IV Piggyback:100] Out: 950 [Urine:950] Intake/Output this shift: No intake/output data recorded.  PE: Gen:  Alert, NAD, pleasant Card:  Reg Pulm:  Normal rate and effort  Abd: Soft, ND, diffuse upper abdominal tenderness (LUQ/epigastrium/RUQ). No peritonitis. +BS Skin: no rashes noted, warm and dry   Lab Results:  Recent Labs    07/06/21 0050 07/07/21 0650  WBC 22.1* 18.2*  HGB 11.4* 11.0*  HCT 35.8* 34.0*  PLT 149* 159   BMET Recent Labs    07/06/21 0050 07/07/21 0650  NA 136 140  K 3.9 3.8  CL 106 111  CO2 24 24  GLUCOSE 131* 113*  BUN 25* 18  CREATININE 1.14* 0.93  CALCIUM 8.3* 7.9*   PT/INR No results for input(s): LABPROT, INR in the last 72 hours. CMP     Component Value Date/Time   NA 140 07/07/2021 0650   K 3.8 07/07/2021 0650   CL 111 07/07/2021 0650   CO2 24 07/07/2021 0650   GLUCOSE 113 (H) 07/07/2021 0650   BUN 18 07/07/2021 0650   CREATININE 0.93 07/07/2021 0650   CREATININE 0.73 06/30/2020 1527   CALCIUM 7.9 (L) 07/07/2021 0650   PROT 6.1 (L) 07/07/2021 0650   ALBUMIN 1.9 (L) 07/07/2021 0650   AST 12 (L) 07/07/2021 0650   ALT 9 07/07/2021 0650   ALKPHOS 50 07/07/2021 0650   BILITOT 0.8 07/07/2021 0650   GFRNONAA >60 07/07/2021 0650   GFRNONAA 79 06/30/2020 1527   GFRAA 91 06/30/2020 1527   Lipase     Component Value Date/Time   LIPASE 23 07/04/2021 1145       Studies/Results: No results found.  Anti-infectives: Anti-infectives (From  admission, onward)    Start     Dose/Rate Route Frequency Ordered Stop   07/05/21 2200  metroNIDAZOLE (FLAGYL) tablet 500 mg        500 mg Oral Every 12 hours 07/05/21 1258     07/05/21 1200  cefTRIAXone (ROCEPHIN) 2 g in sodium chloride 0.9 % 100 mL IVPB        2 g 200 mL/hr over 30 Minutes Intravenous Every 24 hours 07/05/21 1054     07/04/21 1245  cefTRIAXone (ROCEPHIN) 2 g in sodium chloride 0.9 % 100 mL IVPB  Status:  Discontinued        2 g 200 mL/hr over 30 Minutes Intravenous Every 24 hours 07/04/21 1145 07/05/21 0759   07/04/21 1245  metroNIDAZOLE (FLAGYL) IVPB 500 mg  Status:  Discontinued        500 mg 100 mL/hr over 60 Minutes Intravenous Every 8 hours 07/04/21 1145 07/05/21 0759        Assessment/Plan Upper Abdominal Pain in the setting of E. Coli Septicemia  Distended gallbladder on CT Leukocytosis, Fever - CT scan shows mildly distended gallbladder but no stones, or GB wall thickening. RUQ [place] also without evidence of gallstones or gallbladder wall thickening. She does have RUQ abdominal tenderness but this is not focal to the RUQ (diffuse to the upper  abdomen). GI has seen given dilated pancreatic duct on CT (unable to get MRCP given pacemaker). They are not recommending an ERCP or any procedures to evaluate this and felt pancreatic duct appeared similar to prior CT scan in 2016. We will obtain at HIDA scan to rule out acalculous cholecystitis. If this is negative, our team will sign off. If positive, would recommend cardiac eval and likely will require perc chole drain. Keep NPO for scan. Abx per ID.    FEN: NPO ID: [email or web address] VTE: last eliquis 7/10   Per primary: E. 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

## 2021-07-08 ENCOUNTER — Inpatient Hospital Stay (HOSPITAL_COMMUNITY): Payer: Medicare Other

## 2021-07-08 ENCOUNTER — Encounter (HOSPITAL_COMMUNITY): Payer: Self-pay | Admitting: Student

## 2021-07-08 DIAGNOSIS — K819 Cholecystitis, unspecified: Secondary | ICD-10-CM

## 2021-07-08 DIAGNOSIS — R7881 Bacteremia: Secondary | ICD-10-CM | POA: Diagnosis not present

## 2021-07-08 DIAGNOSIS — A4151 Sepsis due to Escherichia coli [E. coli]: Secondary | ICD-10-CM | POA: Diagnosis not present

## 2021-07-08 HISTORY — PX: IR PERC CHOLECYSTOSTOMY: IMG2326

## 2021-07-08 LAB — CBC
HCT: 32.1 % — ABNORMAL LOW (ref 36.0–46.0)
Hemoglobin: 10.3 g/dL — ABNORMAL LOW (ref 12.0–15.0)
MCH: 30.9 pg (ref 26.0–34.0)
MCHC: 32.1 g/dL (ref 30.0–36.0)
MCV: 96.4 fL (ref 80.0–100.0)
Platelets: 174 10*3/uL (ref 150–400)
RBC: 3.33 MIL/uL — ABNORMAL LOW (ref 3.87–5.11)
RDW: 15.1 % (ref 11.5–15.5)
WBC: 16.2 10*3/uL — ABNORMAL HIGH (ref 4.0–10.5)
nRBC: 0 % (ref 0.0–0.2)

## 2021-07-08 LAB — GLUCOSE, CAPILLARY
Glucose-Capillary: 101 mg/dL — ABNORMAL HIGH (ref 70–99)
Glucose-Capillary: 94 mg/dL (ref 70–99)
Glucose-Capillary: 129 mg/dL — ABNORMAL HIGH (ref 70–99)
Glucose-Capillary: 140 mg/dL — ABNORMAL HIGH (ref 70–99)

## 2021-07-08 LAB — COMPREHENSIVE METABOLIC PANEL
ALT: 7 U/L (ref 0–44)
AST: 12 U/L — ABNORMAL LOW (ref 15–41)
Albumin: 1.6 g/dL — ABNORMAL LOW (ref 3.5–5.0)
Alkaline Phosphatase: 56 U/L (ref 38–126)
Anion gap: 6 (ref 5–15)
BUN: 13 mg/dL (ref 8–23)
CO2: 24 mmol/L (ref 22–32)
Calcium: 7.8 mg/dL — ABNORMAL LOW (ref 8.9–10.3)
Chloride: 112 mmol/L — ABNORMAL HIGH (ref 98–111)
Creatinine, Ser: 0.89 mg/dL (ref 0.44–1.00)
GFR, Estimated: >60 mL/min (ref >60)
Glucose, Bld: 115 mg/dL — ABNORMAL HIGH (ref 70–99)
Potassium: 3.7 mmol/L (ref 3.5–5.1)
Sodium: 142 mmol/L (ref 135–145)
Total Bilirubin: 0.5 mg/dL (ref 0.3–1.2)
Total Protein: 5.7 g/dL — ABNORMAL LOW (ref 6.5–8.1)

## 2021-07-08 LAB — MAGNESIUM: Magnesium: 2 mg/dL (ref 1.7–2.4)

## 2021-07-08 MED ORDER — FENTANYL CITRATE (PF) 100 MCG/2ML IJ SOLN
INTRAMUSCULAR | Status: AC
  Filled 2021-07-08: qty 4

## 2021-07-08 MED ORDER — IOHEXOL 300 MG/ML  SOLN
50.0000 mL | Freq: Once | INTRAMUSCULAR | Status: AC | PRN
  Administered 2021-07-08: 20 mL

## 2021-07-08 MED ORDER — LIDOCAINE HCL 1 % IJ SOLN
INTRAMUSCULAR | Status: AC
  Filled 2021-07-08: qty 20

## 2021-07-08 MED ORDER — CEFAZOLIN SODIUM-DEXTROSE 2-4 GM/100ML-% IV SOLN
INTRAVENOUS | Status: AC
  Filled 2021-07-08: qty 100

## 2021-07-08 MED ORDER — MIDAZOLAM HCL 2 MG/2ML IJ SOLN
INTRAMUSCULAR | Status: AC | PRN
  Administered 2021-07-08: 1 mg via INTRAVENOUS

## 2021-07-08 MED ORDER — ENOXAPARIN SODIUM 100 MG/ML IJ SOSY
90.0000 mg | PREFILLED_SYRINGE | Freq: Two times a day (BID) | INTRAMUSCULAR | Status: DC
  Administered 2021-07-09 – 2021-07-10 (×3): 90 mg via SUBCUTANEOUS
  Filled 2021-07-08 (×3): qty 1

## 2021-07-08 MED ORDER — SODIUM CHLORIDE 0.9% FLUSH
5.0000 mL | Freq: Three times a day (TID) | INTRAVENOUS | Status: DC
  Administered 2021-07-09 – 2021-07-11 (×3): 5 mL

## 2021-07-08 MED ORDER — FENTANYL CITRATE (PF) 100 MCG/2ML IJ SOLN
INTRAMUSCULAR | Status: AC | PRN
  Administered 2021-07-08: 50 ug via INTRAVENOUS

## 2021-07-08 MED ORDER — MIDAZOLAM HCL 2 MG/2ML IJ SOLN
INTRAMUSCULAR | Status: AC
  Filled 2021-07-08: qty 4

## 2021-07-08 NOTE — Progress Notes (Signed)
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  Patient was transferred to [facility] under the care of the cardiology service and underwent left heart catheterization on 07/02/2021 notable for mild nonobstructive CAD with catheter induced spasm.  Echocardiogram with LVEF 55-60% with hypokinesis of the distal inferior/inferior septal walls.   On 07/04/2021, patient was noted to have Natalie Castro fever up to 101.3 F with tachypnea.  TRH was consulted on 7/10 for further evaluation and work-up of fever with SIRS symptomatology.  Assessment & Plan:   Principal Problem:   Septicemia due to E. coli ([place]) Active Problems:   Essential hypertension, benign   Saddle pulmonary embolus (HCC)   Elevated troponin I level   Uncontrolled type 2 diabetes mellitus with hyperglycemia (HCC)   Acute coronary syndrome (HCC)   Protein-calorie malnutrition, mild (HCC)   Chest pain   Sepsis ([place])   Bacteremia   Acute kidney injury ([place])   Pancreatic duct dilated  E. coli Bacteremia  Acute Cholecystitis On 07/04/2021, patient developed fever up to 101.3 F with associated tachypnea.  Patient with reported history of nausea/vomiting on presentation with associated chest pain.  Queried possibility of aspiration pneumonia.  Chest x-ray with no acute cardiopulmonary disease process.  Lipase 23, within normal limits.  Review of CT angiogram  chest/abdomen/pelvis performed on 07/02/2021 at time of admission noted mild gallbladder distention and dilation of the pancreatic duct.  Abdominal x-ray 7/10 with nonobstructive bowel gas pattern with recommendation of right upper quadrant ultrasound to exclude acute cholecystitis.  Right upper quadrant ultrasound 7/10 with gallbladder distention without gallstones or wall thickening and no sonographic Murphy sign noted by sonographer with CBD 4 mm.  Blood cultures x2 were ordered with 1 returning positive (anaerobic bottle) for E. coli.  LFTs and total bilirubin within normal limits.  Urinalysis and urine culture unrevealing, although obtained after initiation of IV antibiotics.  Seen by gastroenterology on 07/05/2021, for dilated pancreatic duct and Dr. [name] does not believe this is related to her E. coli bacteremia. - HIDA scan done 7/13 and positive for cholecystitis - plan for perc chole per surgery, 7/14 --ID following, appreciate assistance - narrowed to ancef and flagyl  --fever 7/14 AM, follow with continued abx --clears, NPO at midnight --eliquis currently on hold  --blood cx 7/12 NGTD --blood cx 7/10 E. Coli in 1/2 cultures    Atypical chest pain Nonobstructive CAD Patient initially presenting to [facility] with chest pain, transferred to [place] left heart catheterization with minimal nonobstructive CAD.  TTE with normal LVEF, but hypokinesis of distal inferior/inferoseptal walls. --Atorvastatin 40 mg p.o. daily --Metoprolol succinate 25 mg p.o. daily --Home aspirin discontinued by cardiology (no strong indication per cards), currently on treatment dose Lovenox until further infectious work-up complete and then will resume home Eliquis   Essential hypertension BP appropriate today.  Home regimen includes metoprolol succinate 25 mg p.o. daily, nifedipine 30 mg p.o. daily, losartan 100 mg p.o. daily, hydralazine 50 mg  p.o. twice daily. --Metoprolol  succinate 25 mg p.o. daily, losartan 100 mg daily, hydralazine 50 mg BID --hold remaining antihypertensives including nifedipine --Continue monitor BP closely   Type 2 diabetes mellitus Home regimen includes metformin 1000 mg p.o. twice daily, Lantus 30 units subcutaneously nightly.  Hemoglobin A1c 7.4 07/02/2021, well controlled. --Hold oral hypoglycemics while inpatient --Lantus 20 units 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  Device interrogated with prolonged AV timing, EP, Dr. [name] recommended reprogramming as outpatient.   Hx of PE 2017, on chronic anticoagulation On Eliquis outpatient.  Currently holding Eliquis in favor of treatment dose Lovenox (currently on hold) .Marland Kitchen   Hypomagnesemia: resolved  DVT prophylaxis:lovenox, anticoagulatin dose - on hold with procedure Code Status: full  Family Communication: family at bedside Disposition:   Status is: Inpatient  Remains inpatient appropriate because:Inpatient level of care appropriate due to severity of illness  Dispo: The patient is from: Home              Anticipated d/c is to: Home              Patient currently is not medically stable to d/c.   Difficult to place patient No       Consultants:  Cardiology GI ID surgery  Procedures:  Cath The left ventricular systolic function is normal. LV end diastolic pressure is mildly elevated. LVEDP 17 mm Hg. The left ventricular ejection fraction is 55-65% by visual estimate. There is no aortic valve stenosis. Mild nonobstructive coroanry artery disease. Tortuous vessels. Catheter induced spasm of the ostial RCA.   Mild radial vasospasm.  Continue preventive therapy.  Low level troponin elevation may be related to her rhythm disturbance noted on ECG.    IMPRESSIONS     1. Hypokinesis of the distal inferior/inferoseptal walls. Changes appear  more prominent  than in previous echo in 2017. Marland Kitchen Left ventricular ejection  fraction, by estimation, is 55 to 60%. The left ventricle has normal  function. There is moderate left  ventricular hypertrophy. Elevated left atrial pressure.   2. Right ventricular systolic function is normal. The right ventricular  size is normal. There is normal pulmonary artery systolic pressure.   3. The mitral valve is normal in structure. Mild to moderate mitral valve  regurgitation.   4. The aortic valve is tricuspid. Aortic valve regurgitation is not  visualized. Mild to moderate aortic valve sclerosis/calcification is  present, without any evidence of aortic stenosis.    Antimicrobials:  Anti-infectives (From admission, onward)    Start     Dose/Rate Route Frequency Ordered Stop   07/07/21 1200  ceFAZolin (ANCEF) IVPB 2g/100 mL premix        2 g 200 mL/hr over 30 Minutes Intravenous Every 8 hours 07/07/21 0915     07/05/21 2200  metroNIDAZOLE (FLAGYL) tablet 500 mg        500 mg Oral Every 12 hours 07/05/21 1258     07/05/21 1200  cefTRIAXone (ROCEPHIN) 2 g in sodium chloride 0.9 % 100 mL IVPB  Status:  Discontinued        2 g 200 mL/hr over 30 Minutes Intravenous Every 24 hours 07/05/21 1054 07/07/21 0915   07/04/21 1245  cefTRIAXone (ROCEPHIN) 2 g in sodium chloride 0.9 % 100 mL IVPB  Status:  Discontinued        2 g 200 mL/hr over 30 Minutes Intravenous Every 24 hours  07/04/21 1145 07/05/21 0759   07/04/21 1245  metroNIDAZOLE (FLAGYL) IVPB 500 mg  Status:  Discontinued        500 mg 100 mL/hr over 60 Minutes Intravenous Every 8 hours 07/04/21 1145 07/05/21 0759          Subjective: No new complaints, ready to go down for her procedure  Objective: Vitals:   07/07/21 2110 07/08/21 0009 07/08/21 0509 07/08/21 0814  BP: (!) 127/49 (!) 140/51 (!) 143/54 (!) 155/62  Pulse:  79 78 80  Resp:  20 20 18   Temp:  99.8 F (37.7 C) (!) 100.4 F (38 C) 98.9 F (37.2 C)  TempSrc:  Oral Oral Oral  SpO2:  94%  95% 98%  Weight:      Height:        Intake/Output Summary (Last 24 hours) at 07/08/2021 1046 Last data filed at 07/08/2021 0500 Gross per 24 hour  Intake 1746.68 ml  Output 850 ml  Net 896.68 ml   Filed Weights   07/01/21 2245 07/02/21 0238  Weight: 96.2 kg 95.2 kg    Examination:  General: No acute distress. Cardiovascular: Heart sounds show Quinlynn Cuthbert regular rate, and rhythm. Lungs: Clear to auscultation bilaterally  Abdomen: RUQ discomfort Neurological: Alert and oriented 3. Moves all extremities 4. Cranial nerves II through XII grossly intact. Skin: Warm and dry. No rashes or lesions. Extremities: No clubbing or cyanosis. No edema.  Data Reviewed: I have personally reviewed following labs and imaging studies  CBC: Recent Labs  Lab 07/04/21 1028 07/05/21 0633 07/06/21 0050 07/07/21 0650 07/08/21 0114  WBC 18.7* 25.8* 22.1* 18.2* 16.2*  NEUTROABS 16.0*  --   --   --   --   HGB 12.2 11.9* 11.4* 11.0* 10.3*  HCT 37.3 36.3 35.8* 34.0* 32.1*  MCV 93.5 94.3 95.0 96.3 96.4  PLT 153 158 149* 159 562    Basic Metabolic Panel: Recent Labs  Lab 07/04/21 1028 07/05/21 0253 07/05/21 0633 07/06/21 0050 07/07/21 0650 07/08/21 0114  NA 134*  --  134* 136 140 142  K 3.6  --  3.9 3.9 3.8 3.7  CL 104  --  103 106 111 112*  CO2 22  --  22 24 24 24   GLUCOSE 184*  --  201* 131* 113* 115*  BUN 11  --  24* 25* 18 13  CREATININE 0.95  --  1.34* 1.14* 0.93 0.89  CALCIUM 8.6*  --  8.3* 8.3* 7.9* 7.8*  MG 1.5* 1.9  --  2.0 2.1 2.0    GFR: Estimated Creatinine Clearance: 58.5 mL/min (by C-G formula based on SCr of 0.89 mg/dL).  Liver Function Tests: Recent Labs  Lab 07/01/21 2247 07/05/21 0633 07/06/21 0050 07/07/21 0650 07/08/21 0114  AST 12* 14* 13* 12* 12*  ALT 10 10 12 9 7   ALKPHOS 73 52 55 50 56  BILITOT 0.3 1.2 1.0 0.8 0.5  PROT 7.3 6.4* 6.1* 6.1* 5.7*  ALBUMIN 3.2* 2.3* 2.1* 1.9* 1.6*    CBG: Recent Labs  Lab 07/06/21 2111 07/07/21 0736 07/07/21 1356  07/07/21 2101 07/08/21 0812  GLUCAP 142* 112* 94 165* 101*     Recent Results (from the past 240 hour(s))  Resp Panel by RT-PCR (Flu Tannar Broker&B, Covid) Nasopharyngeal Swab     Status: None   Collection Time: 07/02/21  1:45 AM   Specimen: Nasopharyngeal Swab; Nasopharyngeal(NP) swabs in vial transport medium  Result Value Ref Range Status   SARS Coronavirus 2 by RT PCR NEGATIVE NEGATIVE Final  Comment: (NOTE) SARS-CoV-2 target nucleic acids are NOT DETECTED.  The SARS-CoV-2 RNA is generally detectable in upper respiratory specimens during the acute phase of infection. The lowest concentration of SARS-CoV-2 viral copies this assay can detect is 138 copies/mL. Julianah Marciel negative result does not preclude SARS-Cov-2 infection and should not be used as the sole basis for treatment or other patient management decisions. Kirby Argueta negative result may occur with  improper specimen collection/handling, submission of specimen other than nasopharyngeal swab, presence of viral mutation(s) within the areas targeted by this assay, and inadequate number of viral copies(<138 copies/mL). Kenya Kook negative result must be combined with clinical observations, patient history, and epidemiological information. The expected result is Negative.  Fact Sheet for Patients:  EntrepreneurPulse.com.au  Fact Sheet for Healthcare Providers:  IncredibleEmployment.be  This test is no t yet approved or cleared by the [place] FDA and  has been authorized for detection and/or diagnosis of SARS-CoV-2 by FDA under an Emergency Use Authorization (EUA). This EUA will remain  in effect (meaning this test can be used) for the duration of the COVID-19 declaration under Section 564(b)(1) of the Act, 21 U.S.C.section 360bbb-3(b)(1), unless the authorization is terminated  or revoked sooner.       Influenza Ayah Cozzolino by PCR NEGATIVE NEGATIVE Final   Influenza B by PCR NEGATIVE NEGATIVE Final    Comment:  (NOTE) The Xpert Xpress SARS-CoV-2/FLU/RSV plus assay is intended as an aid in the diagnosis of influenza from Nasopharyngeal swab specimens and should not be used as Makenli Derstine sole basis for treatment. Nasal washings and aspirates are unacceptable for Xpert Xpress SARS-CoV-2/FLU/RSV testing.  Fact Sheet for Patients: EntrepreneurPulse.com.au  Fact Sheet for Healthcare Providers: IncredibleEmployment.be  This test is not yet approved or cleared by the [place] FDA and has been authorized for detection and/or diagnosis of SARS-CoV-2 by FDA under an Emergency Use Authorization (EUA). This EUA will remain in effect (meaning this test can be used) for the duration of the COVID-19 declaration under Section 564(b)(1) of the Act, 21 U.S.C. section 360bbb-3(b)(1), unless the authorization is terminated or revoked.  Performed at [facility], [address] [place], [place] [address]   Culture, blood (routine x 2)     Status: None (Preliminary result)   Collection Time: 07/04/21 10:28 AM   Specimen: BLOOD RIGHT HAND  Result Value Ref Range Status   Specimen Description BLOOD RIGHT HAND  Final   Special Requests   Final    BOTTLES DRAWN AEROBIC ONLY Blood Culture results may not be optimal due to an inadequate volume of blood received in culture bottles   Culture   Final    NO GROWTH 4 DAYS Performed at [facility], [place] [address] [place], [place] [address]    Report Status PENDING  Incomplete  Culture, blood (routine x 2)     Status: Abnormal   Collection Time: 07/04/21 10:28 AM   Specimen: BLOOD LEFT HAND  Result Value Ref Range Status   Specimen Description BLOOD LEFT HAND  Final   Special Requests   Final    BOTTLES DRAWN AEROBIC ONLY Blood Culture adequate volume   Culture  Setup Time   Final    GRAM NEGATIVE RODS AEROBIC BOTTLE ONLY CRITICAL RESULT CALLED TO, READ BACK BY AND VERIFIED WITH: [place] BY [place]. AT [phone] ON 7  11 2022 Performed at [facility], [address] [place], [place] [address]    Culture ESCHERICHIA COLI (Alleta Avery)  Final  Report Status 07/07/2021 FINAL  Final   Organism ID, Bacteria ESCHERICHIA COLI  Final      Susceptibility   Escherichia coli - MIC*    AMPICILLIN 8 SENSITIVE Sensitive     CEFAZOLIN <=4 SENSITIVE Sensitive     CEFEPIME <=0.12 SENSITIVE Sensitive     CEFTAZIDIME <=1 SENSITIVE Sensitive     CEFTRIAXONE <=0.25 SENSITIVE Sensitive     CIPROFLOXACIN <=0.25 SENSITIVE Sensitive     GENTAMICIN <=1 SENSITIVE Sensitive     IMIPENEM <=0.25 SENSITIVE Sensitive     TRIMETH/SULFA <=20 SENSITIVE Sensitive     AMPICILLIN/SULBACTAM 4 SENSITIVE Sensitive     PIP/TAZO <=4 SENSITIVE Sensitive     * ESCHERICHIA COLI  Blood Culture ID Panel (Reflexed)     Status: Abnormal   Collection Time: 07/04/21 10:28 AM  Result Value Ref Range Status   Enterococcus faecalis NOT DETECTED NOT DETECTED Final   Enterococcus Faecium NOT DETECTED NOT DETECTED Final   Listeria monocytogenes NOT DETECTED NOT DETECTED Final   Staphylococcus species NOT DETECTED NOT DETECTED Final   Staphylococcus aureus (BCID) NOT DETECTED NOT DETECTED Final   Staphylococcus epidermidis NOT DETECTED NOT DETECTED Final   Staphylococcus lugdunensis NOT DETECTED NOT DETECTED Final   Streptococcus species NOT DETECTED NOT DETECTED Final   Streptococcus agalactiae NOT DETECTED NOT DETECTED Final   Streptococcus pneumoniae NOT DETECTED NOT DETECTED Final   Streptococcus pyogenes NOT DETECTED NOT DETECTED Final   Wilsie Kern.calcoaceticus-baumannii NOT DETECTED NOT DETECTED Final   Bacteroides fragilis NOT DETECTED NOT DETECTED Final   Enterobacterales DETECTED (Rainier Feuerborn) NOT DETECTED Final    Comment: Enterobacterales represent Jarmaine Ehrler large order of gram negative bacteria, not Mohamed Portlock single organism. CRITICAL RESULT CALLED TO, READ BACK BY AND VERIFIED WITH: PHARMD MIRANDA BRYK BY MESSAN H. AT [phone] ON 7 11 2022    Enterobacter cloacae complex  NOT DETECTED NOT DETECTED Final   Escherichia coli DETECTED (Yiannis Tulloch) NOT DETECTED Final    Comment: CRITICAL RESULT CALLED TO, READ BACK BY AND VERIFIED WITH: PHARMD MIRANDA BRYK BY MESSAN H. AT 0307 ON 7 11 2022    Klebsiella aerogenes NOT DETECTED NOT DETECTED Final   Klebsiella oxytoca NOT DETECTED NOT DETECTED Final   Klebsiella pneumoniae NOT DETECTED NOT DETECTED Final   Proteus species NOT DETECTED NOT DETECTED Final   Salmonella species NOT DETECTED NOT DETECTED Final   Serratia marcescens NOT DETECTED NOT DETECTED Final   Haemophilus influenzae NOT DETECTED NOT DETECTED Final   Neisseria meningitidis NOT DETECTED NOT DETECTED Final   Pseudomonas aeruginosa NOT DETECTED NOT DETECTED Final   Stenotrophomonas maltophilia NOT DETECTED NOT DETECTED Final   Candida albicans NOT DETECTED NOT DETECTED Final   Candida auris NOT DETECTED NOT DETECTED Final   Candida glabrata NOT DETECTED NOT DETECTED Final   Candida krusei NOT DETECTED NOT DETECTED Final   Candida parapsilosis NOT DETECTED NOT DETECTED Final   Candida tropicalis NOT DETECTED NOT DETECTED Final   Cryptococcus neoformans/gattii NOT DETECTED NOT DETECTED Final   CTX-M ESBL NOT DETECTED NOT DETECTED Final   Carbapenem resistance IMP NOT DETECTED NOT DETECTED Final   Carbapenem resistance KPC NOT DETECTED NOT DETECTED Final   Carbapenem resistance NDM NOT DETECTED NOT DETECTED Final   Carbapenem resist OXA 48 LIKE NOT DETECTED NOT DETECTED Final   Carbapenem resistance VIM NOT DETECTED NOT DETECTED Final    Comment: Performed at [facility], [address] [place], [place] [address]  Culture, Urine     Status: None  Collection Time: 07/05/21 12:20 PM   Specimen: Urine, Catheterized  Result Value Ref Range Status   Specimen Description URINE, CATHETERIZED  Final   Special Requests NONE  Final   Culture   Final    NO GROWTH Performed at [facility], [address], [place], [place] [address]    Report Status  07/06/2021 FINAL  Final  Culture, blood (routine x 2)     Status: None (Preliminary result)   Collection Time: 07/06/21  9:24 AM   Specimen: BLOOD  Result Value Ref Range Status   Specimen Description BLOOD LEFT ANTECUBITAL  Final   Special Requests   Final    BOTTLES DRAWN AEROBIC AND ANAEROBIC Blood Culture results may not be optimal due to an excessive volume of blood received in culture bottles   Culture   Final    NO GROWTH 2 DAYS Performed at [facility], [place] [address], [place], [place] [address]    Report Status PENDING  Incomplete  Culture, blood (routine x 2)     Status: None (Preliminary result)   Collection Time: 07/06/21  9:30 AM   Specimen: BLOOD RIGHT FOREARM  Result Value Ref Range Status   Specimen Description BLOOD RIGHT FOREARM  Final   Special Requests   Final    BOTTLES DRAWN AEROBIC AND ANAEROBIC Blood Culture adequate volume   Culture   Final    NO GROWTH 2 DAYS Performed at [facility], [place] [address], [place],  [address]    Report Status PENDING  Incomplete         Radiology Studies: NM Hepato W/EF  Result Date: 07/07/2021 CLINICAL DATA:  Concern for acalculous cholecystitis EXAM: NUCLEAR MEDICINE HEPATOBILIARY IMAGING TECHNIQUE: Sequential images of the abdomen were obtained out to 60 minutes following intravenous administration of radiopharmaceutical. An additional 60 minutes of scintigraphic imaging was performed after the administration of 3 mg of morphine. RADIOPHARMACEUTICALS:  5.2 mCi Tc-67m  Choletec IV COMPARISON:  Ultrasound July 04, 2021. FINDINGS: Prompt uptake and biliary excretion of activity by the liver is seen. No gallbladder activity is visualized at 60 minutes or after following administration of morphine. Biliary activity passes into small bowel, consistent with patent common bile duct. IMPRESSION: Scintigraphic findings consistent with acute cholecystitis. Patent common bile duct. These results will be called to the  ordering clinician or representative by the Radiologist Assistant, and communication documented in the PACS or [employer]. Electronically Signed   By: Dahlia Bailiff MD   On: 07/07/2021 15:23        Scheduled Meds:  atorvastatin  40 mg Oral Daily   hydrALAZINE  50 mg Oral BID   insulin aspart  0-15 Units Subcutaneous TID WC   insulin glargine  20 Units Subcutaneous QHS   losartan  100 mg Oral Daily   metoprolol succinate  25 mg Oral Daily   metroNIDAZOLE  500 mg Oral Q12H   sodium chloride flush  3 mL Intravenous Q12H   Continuous Infusions:  sodium chloride     sodium chloride 75 mL/hr at 07/08/21 0515    ceFAZolin (ANCEF) IV 2 g (07/08/21 0516)     LOS: 6 days    Time spent: over 30 min    Fayrene Helper, MD [facility]   To contact the attending provider between 7A-7P or the covering provider during after hours 7P-7A, please log into the web site [email or web address] and access using universal [place] password for that web site. If  you do not have the password, please call the hospital operator.  07/08/2021, 10:46 AM

## 2021-07-08 NOTE — Progress Notes (Signed)
[facility]  Date of Admission:  07/01/2021      Total days of antibiotics: 6   Ceftriaxone  Flagyl          ASSESSMENT: Natalie Castro is a 80 y.o. female with E coli bacteremia secondary to acute cholecystitis. She has improved regarding bacteremia treatment and awaiting source control with percutaneous chole tube - this is planned today. Surgery is following and recommended cardiac exam prior to any possible surgery. Hopefully can resolve with drain and medical management. Would recommend 2 weeks of total treatment - can use PO option after diet resumes following procedure today. Would do cefdinir + flagyl to complete.   FU with surgery / primary care outpatient.    PLAN: Transition to cefdinir + flagyl tomorrow when diet resumes.  Complete 2 weeks total (7 more from drain placement).   Will sign off - happy to see her back should there be any changes to her care or concerns.     Principal Problem:   Septicemia due to E. coli ([place]) Active Problems:   Essential hypertension, benign   Saddle pulmonary embolus (HCC)   Elevated troponin I level   Uncontrolled type 2 diabetes mellitus with hyperglycemia (HCC)   Acute coronary syndrome (HCC)   Protein-calorie malnutrition, mild (HCC)   Chest pain   Sepsis (HCC)   Bacteremia   Acute kidney injury ([place])   Pancreatic duct dilated    atorvastatin  40 mg Oral Daily   [START ON 07/09/2021] enoxaparin (LOVENOX) injection  90 mg Subcutaneous Q12H   fentaNYL       hydrALAZINE  50 mg Oral BID   insulin aspart  0-15 Units Subcutaneous TID WC   insulin glargine  20 Units Subcutaneous QHS   lidocaine       losartan  100 mg Oral Daily   metoprolol succinate  25 mg Oral Daily   metroNIDAZOLE  500 mg Oral Q12H   midazolam       sodium chloride flush  3 mL Intravenous Q12H    SUBJECTIVE: She is very hungry today and wants something to eat. Still with some abdominal pain but not as bad. Awaiting  percutaneous drain to be placed today.   Her sister is in the room visiting.    Review of Systems: Review of Systems  Constitutional:  Negative for chills and fever.  Cardiovascular:  Negative for chest pain.  Gastrointestinal:  Positive for abdominal pain. Negative for nausea and vomiting.  Skin:  Negative for rash.    Allergies  Allergen Reactions   Penicillins Hives, Itching and Other (See Comments)    Has tolerated ceftriaxone 7/22 Has patient had a PCN reaction causing immediate rash, facial/tongue/throat swelling, SOB or lightheadedness with hypotension: No Has patient had a PCN reaction causing severe rash involving mucus membranes or skin necrosis: Yes Has patient had a PCN reaction that required hospitalization No Has patient had a PCN reaction occurring within the last 10 years: No If all of the above answers are "NO", then may proceed with Cephalosporin use.     OBJECTIVE: Vitals:   07/08/21 1320 07/08/21 1325 07/08/21 1330 07/08/21 1414  BP: (!) 149/63 139/63 (!) 140/56 (!) 155/60  Pulse: 80 82 78 82  Resp: (!) 24 (!) 24 (!) 29 (!) 22  Temp:    99.3 F (37.4 C)  TempSrc:    Oral  SpO2: 100% 98% 98% 95%  Weight:  Height:       Body mass index is 34.93 kg/m.    Physical Exam Vitals and nursing note reviewed.  Constitutional:      Appearance: She is well-developed. She is not ill-appearing.  Abdominal:     Palpations: Abdomen is soft.     Tenderness: There is abdominal tenderness.  Skin:    General: Skin is warm and dry.  Neurological:     Mental Status: She is alert and oriented to person, place, and time.     Lab Results Lab Results  Component Value Date   WBC 16.2 (H) 07/08/2021   HGB 10.3 (L) 07/08/2021   HCT 32.1 (L) 07/08/2021   MCV 96.4 07/08/2021   PLT 174 07/08/2021    Lab Results  Component Value Date   CREATININE 0.89 07/08/2021   BUN 13 07/08/2021   NA 142 07/08/2021   K 3.7 07/08/2021   CL 112 (H) 07/08/2021   CO2 24  07/08/2021    Lab Results  Component Value Date   ALT 7 07/08/2021   AST 12 (L) 07/08/2021   ALKPHOS 56 07/08/2021   BILITOT 0.5 07/08/2021     Microbiology: Recent Results (from the past 240 hour(s))  Resp Panel by RT-PCR (Flu A&B, Covid) Nasopharyngeal Swab     Status: None   Collection Time: 07/02/21  1:45 AM   Specimen: Nasopharyngeal Swab; Nasopharyngeal(NP) swabs in vial transport medium  Result Value Ref Range Status   SARS Coronavirus 2 by RT PCR NEGATIVE NEGATIVE Final    Comment: (NOTE) SARS-CoV-2 target nucleic acids are NOT DETECTED.  The SARS-CoV-2 RNA is generally detectable in upper respiratory specimens during the acute phase of infection. The lowest concentration of SARS-CoV-2 viral copies this assay can detect is 138 copies/mL. A negative result does not preclude SARS-Cov-2 infection and should not be used as the sole basis for treatment or other patient management decisions. A negative result may occur with  improper specimen collection/handling, submission of specimen other than nasopharyngeal swab, presence of viral mutation(s) within the areas targeted by this assay, and inadequate number of viral copies(<138 copies/mL). A negative result must be combined with clinical observations, patient history, and epidemiological information. The expected result is Negative.  Fact Sheet for Patients:  EntrepreneurPulse.com.au  Fact Sheet for Healthcare Providers:  IncredibleEmployment.be  This test is no t yet approved or cleared by the [place] FDA and  has been authorized for detection and/or diagnosis of SARS-CoV-2 by FDA under an Emergency Use Authorization (EUA). This EUA will remain  in effect (meaning this test can be used) for the duration of the COVID-19 declaration under Section 564(b)(1) of the Act, 21 U.S.C.section 360bbb-3(b)(1), unless the authorization is terminated  or revoked sooner.       Influenza A  by PCR NEGATIVE NEGATIVE Final   Influenza B by PCR NEGATIVE NEGATIVE Final    Comment: (NOTE) The Xpert Xpress SARS-CoV-2/FLU/RSV plus assay is intended as an aid in the diagnosis of influenza from Nasopharyngeal swab specimens and should not be used as a sole basis for treatment. Nasal washings and aspirates are unacceptable for Xpert Xpress SARS-CoV-2/FLU/RSV testing.  Fact Sheet for Patients: EntrepreneurPulse.com.au  Fact Sheet for Healthcare Providers: IncredibleEmployment.be  This test is not yet approved or cleared by the [place] FDA and has been authorized for detection and/or diagnosis of SARS-CoV-2 by FDA under an Emergency Use Authorization (EUA). This EUA will remain in effect (meaning this test can be used) for the duration of  the COVID-19 declaration under Section 564(b)(1) of the Act, 21 U.S.C. section 360bbb-3(b)(1), unless the authorization is terminated or revoked.  Performed at [facility], [address] [place], [place] [address]   Culture, blood (routine x 2)     Status: None (Preliminary result)   Collection Time: 07/04/21 10:28 AM   Specimen: BLOOD RIGHT HAND  Result Value Ref Range Status   Specimen Description BLOOD RIGHT HAND  Final   Special Requests   Final    BOTTLES DRAWN AEROBIC ONLY Blood Culture results may not be optimal due to an inadequate volume of blood received in culture bottles   Culture   Final    NO GROWTH 4 DAYS Performed at [facility], [place] [address] [place], [place] [address]    Report Status PENDING  Incomplete  Culture, blood (routine x 2)     Status: Abnormal   Collection Time: 07/04/21 10:28 AM   Specimen: BLOOD LEFT HAND  Result Value Ref Range Status   Specimen Description BLOOD LEFT HAND  Final   Special Requests   Final    BOTTLES DRAWN AEROBIC ONLY Blood Culture adequate volume   Culture  Setup Time   Final    GRAM NEGATIVE RODS AEROBIC BOTTLE ONLY CRITICAL RESULT  CALLED TO, READ BACK BY AND VERIFIED WITH: [place] BY [place]. AT [phone] ON 7 11 2022 Performed at [facility], [address] [place], [place] [address]    Culture ESCHERICHIA COLI (A)  Final   Report Status 07/07/2021 FINAL  Final   Organism ID, Bacteria ESCHERICHIA COLI  Final      Susceptibility   Escherichia coli - MIC*    AMPICILLIN 8 SENSITIVE Sensitive     CEFAZOLIN <=4 SENSITIVE Sensitive     CEFEPIME <=0.12 SENSITIVE Sensitive     CEFTAZIDIME <=1 SENSITIVE Sensitive     CEFTRIAXONE <=0.25 SENSITIVE Sensitive     CIPROFLOXACIN <=0.25 SENSITIVE Sensitive     GENTAMICIN <=1 SENSITIVE Sensitive     IMIPENEM <=0.25 SENSITIVE Sensitive     TRIMETH/SULFA <=20 SENSITIVE Sensitive     AMPICILLIN/SULBACTAM 4 SENSITIVE Sensitive     PIP/TAZO <=4 SENSITIVE Sensitive     * ESCHERICHIA COLI  Blood Culture ID Panel (Reflexed)     Status: Abnormal   Collection Time: 07/04/21 10:28 AM  Result Value Ref Range Status   Enterococcus faecalis NOT DETECTED NOT DETECTED Final   Enterococcus Faecium NOT DETECTED NOT DETECTED Final   Listeria monocytogenes NOT DETECTED NOT DETECTED Final   Staphylococcus species NOT DETECTED NOT DETECTED Final   Staphylococcus aureus (BCID) NOT DETECTED NOT DETECTED Final   Staphylococcus epidermidis NOT DETECTED NOT DETECTED Final   Staphylococcus lugdunensis NOT DETECTED NOT DETECTED Final   Streptococcus species NOT DETECTED NOT DETECTED Final   Streptococcus agalactiae NOT DETECTED NOT DETECTED Final   Streptococcus pneumoniae NOT DETECTED NOT DETECTED Final   Streptococcus pyogenes NOT DETECTED NOT DETECTED Final   A.calcoaceticus-baumannii NOT DETECTED NOT DETECTED Final   Bacteroides fragilis NOT DETECTED NOT DETECTED Final   Enterobacterales DETECTED (A) NOT DETECTED Final    Comment: Enterobacterales represent a large order of gram negative bacteria, not a single organism. CRITICAL RESULT CALLED TO, READ BACK BY AND VERIFIED  WITH: PHARMD MIRANDA BRYK BY MESSAN H. AT [phone] ON 7 11 2022    Enterobacter cloacae complex NOT DETECTED NOT DETECTED Final   Escherichia coli DETECTED (A) NOT DETECTED Final    Comment: CRITICAL  RESULT CALLED TO, READ BACK BY AND VERIFIED WITH: PHARMD MIRANDA BRYK BY MESSAN H. AT 1657 ON 7 11 2022    Klebsiella aerogenes NOT DETECTED NOT DETECTED Final   Klebsiella oxytoca NOT DETECTED NOT DETECTED Final   Klebsiella pneumoniae NOT DETECTED NOT DETECTED Final   Proteus species NOT DETECTED NOT DETECTED Final   Salmonella species NOT DETECTED NOT DETECTED Final   Serratia marcescens NOT DETECTED NOT DETECTED Final   Haemophilus influenzae NOT DETECTED NOT DETECTED Final   Neisseria meningitidis NOT DETECTED NOT DETECTED Final   Pseudomonas aeruginosa NOT DETECTED NOT DETECTED Final   Stenotrophomonas maltophilia NOT DETECTED NOT DETECTED Final   Candida albicans NOT DETECTED NOT DETECTED Final   Candida auris NOT DETECTED NOT DETECTED Final   Candida glabrata NOT DETECTED NOT DETECTED Final   Candida krusei NOT DETECTED NOT DETECTED Final   Candida parapsilosis NOT DETECTED NOT DETECTED Final   Candida tropicalis NOT DETECTED NOT DETECTED Final   Cryptococcus neoformans/gattii NOT DETECTED NOT DETECTED Final   CTX-M ESBL NOT DETECTED NOT DETECTED Final   Carbapenem resistance IMP NOT DETECTED NOT DETECTED Final   Carbapenem resistance KPC NOT DETECTED NOT DETECTED Final   Carbapenem resistance NDM NOT DETECTED NOT DETECTED Final   Carbapenem resist OXA 48 LIKE NOT DETECTED NOT DETECTED Final   Carbapenem resistance VIM NOT DETECTED NOT DETECTED Final    Comment: Performed at [facility], [address], [place], [place] [address]  Culture, Urine     Status: None   Collection Time: 07/05/21 12:20 PM   Specimen: Urine, Catheterized  Result Value Ref Range Status   Specimen Description URINE, CATHETERIZED  Final   Special Requests NONE  Final   Culture   Final    NO  GROWTH Performed at [facility], [address] [place], [place] [address]    Report Status 07/06/2021 FINAL  Final  Culture, blood (routine x 2)     Status: None (Preliminary result)   Collection Time: 07/06/21  9:24 AM   Specimen: BLOOD  Result Value Ref Range Status   Specimen Description BLOOD LEFT ANTECUBITAL  Final   Special Requests   Final    BOTTLES DRAWN AEROBIC AND ANAEROBIC Blood Culture results may not be optimal due to an excessive volume of blood received in culture bottles   Culture   Final    NO GROWTH 2 DAYS Performed at [facility], [place] [address], [place], [place] [address]    Report Status PENDING  Incomplete  Culture, blood (routine x 2)     Status: None (Preliminary result)   Collection Time: 07/06/21  9:30 AM   Specimen: BLOOD RIGHT FOREARM  Result Value Ref Range Status   Specimen Description BLOOD RIGHT FOREARM  Final   Special Requests   Final    BOTTLES DRAWN AEROBIC AND ANAEROBIC Blood Culture adequate volume   Culture   Final    NO GROWTH 2 DAYS Performed at [facility], [place] [address], [place], [place] [address]    Report Status PENDING  Incomplete    Janene Madeira, MSN, NP-C [facility] Cell: [phone] Pager: [phone]  @TODAY @ 3:07 PM

## 2021-07-08 NOTE — Procedures (Signed)
Pre procedural Dx: Acute cholecysitis Post procedural Dx: Same  Technically successful [place] and Fluoro guided placement of a 12 Fr drainage catheter placement into the gallbladder lumen. Chole tube connected to gravity bag.  EBL: None  Complications: None immediate  Ronny Bacon, MD Pager #: [phone]

## 2021-07-08 NOTE — Progress Notes (Signed)
6 Days Post-Op   Subjective/Chief Complaint: Upper ab pain still, hida positive   Objective: Vital signs in last 24 hours: Temp:  [98.9 F (37.2 C)-100.4 F (38 C)] 98.9 F (37.2 C) (07/14 0814) Pulse Rate:  [76-85] 80 (07/14 0814) Resp:  [18-20] 18 (07/14 0814) BP: (127-155)/(49-63) 155/62 (07/14 0814) SpO2:  [93 %-98 %] 98 % (07/14 0814) Last BM Date: 07/08/21  Intake/Output from previous day: 07/13 0701 - 07/14 0700 In: 1746.7 [I.V.:1546.7; IV Piggyback:200] Out: 850 [Urine:850] Intake/Output this shift: No intake/output data recorded.  Ab soft tender bilateral uq   Lab Results:  Recent Labs    07/07/21 0650 07/08/21 0114  WBC 18.2* 16.2*  HGB 11.0* 10.3*  HCT 34.0* 32.1*  PLT 159 174   BMET Recent Labs    07/07/21 0650 07/08/21 0114  NA 140 142  K 3.8 3.7  CL 111 112*  CO2 24 24  GLUCOSE 113* 115*  BUN 18 13  CREATININE 0.93 0.89  CALCIUM 7.9* 7.8*   PT/INR No results for input(s): LABPROT, INR in the last 72 hours. ABG No results for input(s): PHART, HCO3 in the last 72 hours.  Invalid input(s): PCO2, PO2  Studies/Results: NM Hepato W/EF  Result Date: 07/07/2021 CLINICAL DATA:  Concern for acalculous cholecystitis EXAM: NUCLEAR MEDICINE HEPATOBILIARY IMAGING TECHNIQUE: Sequential images of the abdomen were obtained out to 60 minutes following intravenous administration of radiopharmaceutical. An additional 60 minutes of scintigraphic imaging was performed after the administration of 3 mg of morphine. RADIOPHARMACEUTICALS:  5.2 mCi Tc-62m  Choletec IV COMPARISON:  Ultrasound July 04, 2021. FINDINGS: Prompt uptake and biliary excretion of activity by the liver is seen. No gallbladder activity is visualized at 60 minutes or after following administration of morphine. Biliary activity passes into small bowel, consistent with patent common bile duct. IMPRESSION: Scintigraphic findings consistent with acute cholecystitis. Patent common bile duct. These  results will be called to the ordering clinician or representative by the Radiologist Assistant, and communication documented in the PACS or [employer]. Electronically Signed   By: Dahlia Bailiff MD   On: 07/07/2021 15:23    Anti-infectives: Anti-infectives (From admission, onward)    Start     Dose/Rate Route Frequency Ordered Stop   07/07/21 1200  ceFAZolin (ANCEF) IVPB 2g/100 mL premix        2 g 200 mL/hr over 30 Minutes Intravenous Every 8 hours 07/07/21 0915     07/05/21 2200  metroNIDAZOLE (FLAGYL) tablet 500 mg        500 mg Oral Every 12 hours 07/05/21 1258     07/05/21 1200  cefTRIAXone (ROCEPHIN) 2 g in sodium chloride 0.9 % 100 mL IVPB  Status:  Discontinued        2 g 200 mL/hr over 30 Minutes Intravenous Every 24 hours 07/05/21 1054 07/07/21 0915   07/04/21 1245  cefTRIAXone (ROCEPHIN) 2 g in sodium chloride 0.9 % 100 mL IVPB  Status:  Discontinued        2 g 200 mL/hr over 30 Minutes Intravenous Every 24 hours 07/04/21 1145 07/05/21 0759   07/04/21 1245  metroNIDAZOLE (FLAGYL) IVPB 500 mg  Status:  Discontinued        500 mg 100 mL/hr over 60 Minutes Intravenous Every 8 hours 07/04/21 1145 07/05/21 0759       Assessment/Plan: Upper Abdominal Pain in the setting of E. Coli Septicemia  Distended gallbladder on CT, pos hida scan acalculous cholecystitis Leukocytosis, Fever - CT scan shows mildly distended  gallbladder but no stones, or GB wall thickening. RUQ [place] also without evidence of gallstones or gallbladder wall thickening. She does have RUQ abdominal tenderness but this is not focal to the RUQ (diffuse to the upper abdomen). GI has seen given dilated pancreatic duct on CT (unable to get MRCP given pacemaker). They are not recommending an ERCP or any procedures to evaluate this and felt pancreatic duct appeared similar to prior CT scan in 2016.  -I think perc chole reasonable for what appears to be acalcuous disease. Discussed with patient and daughter.  May end up  needing lap chole in future but if resolves and no stones could avoid surgery.   -appreciate IR care   FEN: NPO, fine from my standpoint for regular diet after procedure  ID: [email or web address]/flagyl VTE: last eliquis 7/10, can have pharm proph from my standpoint when done with procedure   Per primary: E. 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

## 2021-07-08 NOTE — Care Management Important Message (Signed)
Important Message  Patient Details  Name: Natalie Castro MRN: [id number] Date of Birth: [birth year]   Medicare Important Message Given:  Yes     Shelda Altes 07/08/2021, 11:30 AM

## 2021-07-08 NOTE — Consult Note (Signed)
Chief Complaint: Patient was seen in consultation today for acalculous cholecystitis  Referring Physician(s): Dr. [name]  Supervising Physician: Sandi Mariscal  Patient Status: [facility] - In-pt  History of Present Illness: [facility] is a 80 y.o. female with past medical history of HLD, arthritis, GERD, PE in 2017, CHB s/p St. Jude pacemaker placement in 2016, recently admitted with chest pain and underwent LHC 07/02/21 with evidence of mild CAD and no intervention was required.  She continued to have chest pain, abdominal pain, diarrhea.  Further work-up revealed acute cholecystitis.    HIDA 7/13: Scintigraphic findings consistent with acute cholecystitis. Patent common bile duct.  WBC 16.2 Tmax 100.4 overnight.   Surgery has evaluated and recommends percutaneous cholecystostomy tube placement.  Case reviewed by Dr. Francena Hanly who approves patient for procedure.   PA to bedside. Patient resting, accompanied by family. She is understanding of drain placement and care needed as her husband had drains several years ago. Although disappointed, she understanding of the care place and is agreeable to proceed.   Past Medical History:  Diagnosis Date   Acid reflux    Arthritis    Colon adenomas    AGE 12   Complete heart block (HCC)    STJ PPM Dr. 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, long term anticoag with apixaban    Hyperlipidemia    Myoview 03/2021    Myoview 4/22: EF 50, no infarct or ischemia; low risk   Presence of permanent cardiac pacemaker    Type 2 diabetes mellitus ([place])     Past Surgical History:  Procedure Laterality Date   COLONOSCOPY     3 SIMPLE ADENOMAS, AGE 23   COLONOSCOPY N/A 05/24/2018   Procedure: COLONOSCOPY;  Surgeon: Danie Binder, MD;  Location: [facility];  Service: Endoscopy;  Laterality: N/A;  1:00pm   EP IMPLANTABLE DEVICE N/A 11/24/2015   Procedure: Pacemaker Implant;  Surgeon: [name], MD;  Location: [place] [facility];  Service: Cardiovascular;  Laterality: N/A;   JOINT REPLACEMENT     knees bilat.   LEFT HEART CATH AND CORONARY ANGIOGRAPHY N/A 07/02/2021   Procedure: LEFT HEART CATH AND CORONARY ANGIOGRAPHY;  Surgeon: Jettie Booze, MD;  Location: [facility];  Service: Cardiovascular;  Laterality: N/A;   POLYPECTOMY  05/24/2018   Procedure: POLYPECTOMY;  Surgeon: Danie Binder, MD;  Location: [facility];  Service: Endoscopy;;  ascending and hepatic flexure, transverse   TUBAL LIGATION      Allergies: Penicillins  Medications: Prior to Admission medications   Medication Sig Start Date End Date Taking? Authorizing Provider  atorvastatin (LIPITOR) 40 MG tablet TAKE (1) TABLET BY MOUTH AT BEDTIME. Patient taking differently: Take 40 mg by mouth daily. 05/28/20  Yes Corum, Rex Kras, MD  ELIQUIS 5 MG TABS tablet TAKE 1 TABLET BY MOUTH TWICE A DAY. Patient taking differently: Take 5 mg by mouth 2 (two) times daily. 05/05/20  Yes Corum, Rex Kras, MD  hydrALAZINE (APRESOLINE) 50 MG tablet Take 50 mg by mouth 3 (three) times daily.   Yes [provider]  LANTUS SOLOSTAR 100 UNIT/ML Solostar Pen Inject 30 Units into the skin at bedtime. 07/06/20  Yes Nida, Marella Chimes, MD  losartan (COZAAR) 100 MG tablet TAKE (1) TABLET BY MOUTH ONCE DAILY. Patient taking differently: Take 100 mg by mouth daily. 05/28/20  Yes Corum, Rex Kras, MD  metFORMIN (GLUCOPHAGE) 1000 MG tablet  TAKE (1) TABLET BY MOUTH TWICE DAILY. Patient taking differently: Take 1,000 mg by mouth 2 (two) times daily with a meal. 05/17/21  Yes Nida, Marella Chimes, MD  NIFEdipine (PROCARDIA-XL/NIFEDICAL-XL) 30 MG 24 hr tablet TAKE 1 TABLET BY MOUTH ONCE A DAY. Patient taking differently: Take 30 mg by mouth daily. 05/28/20  Yes Corum, Rex Kras, MD  glucose blood test strip Use to check blood glucose fasting , before lunch and dinner and after your largest meal 11/04/19   Corum, Rex Kras, MD   hydrALAZINE (APRESOLINE) 25 MG tablet Take 1 tablet (25 mg total) by mouth 2 (two) times daily. 07/03/21   Kathyrn Drown D, NP  Lancets [facility] ULTRASOFT) lancets Use to check glucose 4 x daily 11/04/19   Corum, Rex Kras, MD  metoprolol succinate (TOPROL-XL) 25 MG 24 hr tablet Take 1 tablet (25 mg total) by mouth daily. 07/03/21   Tommie Raymond, NP  nitroGLYCERIN (NITROSTAT) 0.4 MG SL tablet Place 1 tablet (0.4 mg total) under the tongue every 5 (five) minutes as needed for chest pain. Patient not taking: Reported on 07/02/2021 02/24/21 05/25/21  Liliane Shi, PA-C     Family History  Problem Relation Age of Onset   Heart failure Mother    Hypertension Mother    Heart failure Sister    Hypertension Father    Cancer Sister    Dementia Sister    Colon cancer Neg Hx    Colon polyps Neg Hx     Social History   Socioeconomic History   Marital status: Widowed    Spouse name: Not on file   Number of children: Not on file   Years of education: Not on file   Highest education level: Not on file  Occupational History   Occupation: retired  Tobacco Use   Smoking status: Never   Smokeless tobacco: Never  Vaping Use   Vaping Use: Never used  Substance and Sexual Activity   Alcohol use: No    Alcohol/week: 0.0 standard drinks   Drug use: No   Sexual activity: Yes    Birth control/protection: Post-menopausal  Other Topics Concern   Not on file  Social History Narrative   MARRIED FOR 46 YRS. 5 KIDS: #4 PRESENT TODAY(AGE 66)   Social Determinants of Health   Financial Resource Strain: Not on file  Food Insecurity: Not on file  Transportation Needs: Not on file  Physical Activity: Not on file  Stress: Not on file  Social Connections: Not on file     Review of Systems: A 12 point ROS discussed and pertinent positives are indicated in the HPI above.  All other systems are negative.  Review of Systems  Constitutional:  Positive for fatigue and fever.  Respiratory:  Negative for  cough and shortness of breath.   Cardiovascular:  Negative for chest pain.  Gastrointestinal:  Positive for abdominal pain and diarrhea.  Genitourinary:  Negative for dysuria.  Psychiatric/Behavioral:  Negative for behavioral problems and confusion.    Vital Signs: BP (!) 155/62 (BP Location: Left Arm)   Pulse 80   Temp 98.9 F (37.2 C) (Oral)   Resp 18   Ht 5\' 5"  (1.651 m)   Wt 209 lb 14.1 oz (95.2 kg)   SpO2 98%   BMI 34.93 kg/m   Physical Exam Vitals and nursing note reviewed.  Constitutional:      General: She is not in acute distress.    Appearance: She is well-developed. She is ill-appearing.  Cardiovascular:     Rate and Rhythm: Normal rate and regular rhythm.  Pulmonary:     Effort: Pulmonary effort is normal. No tachypnea.     Breath sounds: Normal breath sounds.  Abdominal:     Palpations: Abdomen is soft.  Skin:    General: Skin is warm and dry.  Neurological:     General: No focal deficit present.     Mental Status: She is alert and oriented to person, place, and time.  Psychiatric:        Mood and Affect: Mood normal.        Behavior: Behavior normal.     MD Evaluation Airway: WNL Heart: WNL Abdomen: WNL Chest/ Lungs: WNL ASA  Classification: 3 Mallampati/Airway Score: Three   Imaging: DG Abd 1 View  Result Date: 07/04/2021 CLINICAL DATA:  Fever and abdominal pain. EXAM: ABDOMEN - 1 VIEW COMPARISON:  CT abdomen dated 07/02/2021. FINDINGS: Visualized bowel gas pattern is nonobstructive. No evidence of abnormal fluid collection. No evidence of renal or ureteral calculi. Visualized osseous structures are unremarkable. IMPRESSION: 1. Nonobstructive bowel gas pattern. 2. Recommend RIGHT upper quadrant ultrasound to exclude acute cholecystitis as a possible cause for patient's fever and abdominal pain. These results will be called to the ordering clinician or representative by the Radiologist Assistant, and communication documented in the PACS or [employer]. Electronically Signed   By: Franki Cabot M.D.   On: 07/04/2021 10:48   CARDIAC CATHETERIZATION  Addendum Date: 07/02/2021    The left ventricular systolic function is normal.  LV end diastolic pressure is mildly elevated. LVEDP 17 mm Hg.  The left ventricular ejection fraction is 55-65% by visual estimate.  There is no aortic valve stenosis.  Mild nonobstructive coroanry artery disease.  Tortuous vessels. Catheter induced spasm of the ostial RCA.  Mild radial vasospasm.  Continue preventive therapy.  Low level troponin elevation may be related to her rhythm disturbance noted on ECG.    Addendum Date: 07/02/2021    The left ventricular systolic function is normal.  LV end diastolic pressure is mildly elevated. LVEDP 17 mm Hg.  The left ventricular ejection fraction is 55-65% by visual estimate.  There is no aortic valve stenosis.  Mild nonobstructive coroanry artery disease.  Tortuous vessels. Catheter induced spasm of the ostial RCA.  Mild radial vasospasm.  Continue preventive therapy.   Result Date: 07/02/2021  The left ventricular systolic function is normal.  LV end diastolic pressure is mildly elevated. LVEDP 17 mm Hg.  The left ventricular ejection fraction is 55-65% by visual estimate.  There is no aortic valve stenosis.  Mild nonobstructive coroanry artery disease.  Tortuous vessels. Catheter induced spasm of the ostial RCA.  Mild radial vasospasm.  Continue preventive therapy.   NM Hepato W/EF  Result Date: 07/07/2021 CLINICAL DATA:  Concern for acalculous cholecystitis EXAM: NUCLEAR MEDICINE HEPATOBILIARY IMAGING TECHNIQUE: Sequential images of the abdomen were obtained out to 60 minutes following intravenous administration of radiopharmaceutical. An additional 60 minutes of scintigraphic imaging was performed after the administration of 3 mg of morphine. RADIOPHARMACEUTICALS:  5.2 mCi Tc-61m  Choletec IV COMPARISON:  Ultrasound July 04, 2021. FINDINGS: Prompt uptake  and biliary excretion of activity by the liver is seen. No gallbladder activity is visualized at 60 minutes or after following administration of morphine. Biliary activity passes into small bowel, consistent with patent common bile duct. IMPRESSION: Scintigraphic findings consistent with acute cholecystitis. Patent common bile duct. These results will be called to the ordering  clinician or representative by the Radiologist Assistant, and communication documented in the PACS or [employer]. Electronically Signed   By: Dahlia Bailiff MD   On: 07/07/2021 15:23   DG CHEST PORT 1 VIEW  Result Date: 07/04/2021 CLINICAL DATA:  Acute coronary syndrome, fever, abd pain EXAM: PORTABLE CHEST 1 VIEW COMPARISON:  Chest x-ray dated 07/01/2021. FINDINGS: Heart size and mediastinal contours are stable. Lungs are clear. No pleural effusion or pneumothorax is seen. No acute-appearing osseous abnormality. LEFT chest wall pacemaker/ICD apparatus in place. IMPRESSION: No active disease. No evidence of pneumonia or pulmonary edema. Electronically Signed   By: Franki Cabot M.D.   On: 07/04/2021 10:45   DG Chest Portable 1 View  Result Date: 07/01/2021 CLINICAL DATA:  Centralized chest pain radiating to the back. Nausea and vomiting. EXAM: PORTABLE CHEST 1 VIEW COMPARISON:  02/23/2021 FINDINGS: Cardiac pacemaker. Borderline heart size with normal pulmonary vascularity. No airspace disease or consolidation in the lungs. No pleural effusions. No pneumothorax. Mediastinal contours appear intact. Calcification of the aorta. IMPRESSION: Borderline heart size.  No active pulmonary disease. Electronically Signed   By: Lucienne Capers M.D.   On: 07/01/2021 23:23   ECHOCARDIOGRAM COMPLETE  Result Date: 07/02/2021    ECHOCARDIOGRAM REPORT   Patient Name:   CORTNIE RINGEL Date of Exam: 07/02/2021 Medical Rec #:  [id number]    Height:       65.0 in Accession #:    [id number]   Weight:       209.9 lb Date of Birth:  1941-10-26    BSA:           2.019 m Patient Age:    43 years     BP:           141/88 mmHg Patient Gender: F            HR:           80 bpm. Exam Location:  [facility] Procedure: 2D Echo, Cardiac Doppler and Color Doppler Indications:    Chest Pain  History:        Patient has prior history of Echocardiogram examinations, most                 recent 10/26/2018. Pacemaker; Risk Factors:Hypertension, Diabetes                 and Dyslipidemia.  Sonographer:    Wenda Low Referring Phys: [id number] [place]  1. Hypokinesis of the distal inferior/inferoseptal walls. Changes appear more prominent than in previous echo in 2017. Marland Kitchen Left ventricular ejection fraction, by estimation, is 55 to 60%. The left ventricle has normal function. 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 Elevated left atrial pressure.  2. Right ventricular systolic function is normal. The right ventricular size is normal. There is normal pulmonary artery systolic pressure.  3. The mitral valve is normal in structure. Mild to moderate mitral valve regurgitation.  4. The aortic valve is tricuspid. Aortic valve regurgitation is not visualized. Mild to moderate aortic valve sclerosis/calcification is present, without any evidence of aortic stenosis. FINDINGS  Left Ventricle: Hypokinesis of the distal inferior/inferoseptal walls. Changes appear more prominent than in previous echo in 2017. Left ventricular ejection fraction, by estimation, is 55 to 60%. The left ventricle has normal function. The left ventricular internal cavity size was normal in size. 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 Elevated left atrial pressure. Right Ventricle: The right ventricular size is normal. Right vetricular wall thickness was  not assessed. Right ventricular systolic function is normal. There is normal pulmonary artery systolic pressure. The tricuspid regurgitant velocity is 2.31 m/s, and with an assumed right atrial pressure of 3 mmHg, the estimated right  ventricular systolic pressure is 55.9 mmHg. Left Atrium: Left atrial size was normal in size. Right Atrium: Right atrial size was normal in size. Pericardium: There is no evidence of pericardial effusion. Mitral Valve: The mitral valve is normal in structure. Mild to moderate mitral valve regurgitation. MV peak gradient, 5.6 mmHg. The mean mitral valve gradient is 2.0 mmHg. Tricuspid Valve: The tricuspid valve is normal in structure. Tricuspid valve regurgitation is mild. Aortic Valve: The aortic valve is tricuspid. Aortic valve regurgitation is not visualized. Mild to moderate aortic valve sclerosis/calcification is present, without any evidence of aortic stenosis. Aortic valve mean gradient measures 4.0 mmHg. Aortic valve peak gradient measures 7.8 mmHg. Aortic valve area, by VTI measures 1.60 cm. Pulmonic Valve: The pulmonic valve was grossly normal. Pulmonic valve regurgitation is not visualized. Aorta: The aortic root and ascending aorta are structurally normal, with no evidence of dilitation. IAS/Shunts: No atrial level shunt detected by color flow Doppler. Additional Comments: A device lead is visualized.  LEFT VENTRICLE PLAX 2D LVIDd:         4.37 cm      Diastology LVIDs:         3.24 cm      LV e' medial:    4.47 cm/s LV PW:         1.62 cm      LV E/e' medial:  19.1 LV IVS:        1.56 cm      LV e' lateral:   8.11 cm/s LVOT diam:     2.00 cm      LV E/e' lateral: 10.5 LV SV:         52 LV SV Index:   26 LVOT Area:     3.14 cm  LV Volumes (MOD) LV vol d, MOD A2C: 122.0 ml LV vol d, MOD A4C: 138.0 ml LV vol s, MOD A2C: 53.1 ml LV vol s, MOD A4C: 66.8 ml LV SV MOD A2C:     68.9 ml LV SV MOD A4C:     138.0 ml LV SV MOD BP:      72.7 ml RIGHT VENTRICLE RV Basal diam:  3.50 cm RV Mid diam:    2.76 cm RV S prime:     8.19 cm/s TAPSE (M-mode): 2.5 cm LEFT ATRIUM             Index       RIGHT ATRIUM           Index LA diam:        4.10 cm 2.03 cm/m  RA Area:     16.40 cm LA Vol (A2C):   55.3 ml 27.39 ml/m RA  Volume:   43.00 ml  21.30 ml/m LA Vol (A4C):   58.4 ml 28.95 ml/m LA Biplane Vol: 61.6 ml 30.51 ml/m  AORTIC VALVE AV Area (Vmax):    1.66 cm AV Area (Vmean):   1.54 cm AV Area (VTI):     1.60 cm AV Vmax:           140.00 cm/s AV Vmean:          97.200 cm/s AV VTI:            0.322 m AV Peak Grad:      7.8 mmHg  AV Mean Grad:      4.0 mmHg LVOT Vmax:         73.80 cm/s LVOT Vmean:        47.700 cm/s LVOT VTI:          0.164 m LVOT/AV VTI ratio: 0.51  AORTA Ao Root diam: 2.90 cm Ao Asc diam:  3.10 cm MITRAL VALVE                 TRICUSPID VALVE MV Area (PHT): 2.71 cm      TR Peak grad:   21.3 mmHg MV Area VTI:   1.53 cm      TR Vmax:        231.00 cm/s MV Peak grad:  5.6 mmHg MV Mean grad:  2.0 mmHg      SHUNTS MV Vmax:       1.18 m/s      Systemic VTI:  0.16 m MV Vmean:      64.4 cm/s     Systemic Diam: 2.00 cm MV Decel Time: 280 msec MR Peak grad:    96.0 mmHg MR Mean grad:    47.0 mmHg MR Vmax:         490.00 cm/s MR Vmean:        291.0 cm/s MR PISA:         1.57 cm MR PISA Eff ROA: 30 mm MR PISA Radius:  0.50 cm MV E velocity: 85.30 cm/s MV A velocity: 91.30 cm/s MV E/A ratio:  0.93 Dorris Carnes MD Electronically signed by Dorris Carnes MD Signature Date/Time: 07/02/2021/6:37:08 PM    Final    CT Angio Chest/Abd/Pel for Dissection W and/or Wo Contrast  Result Date: 07/02/2021 CLINICAL DATA:  Centralized chest pain radiating to the back. Nausea. EXAM: CT ANGIOGRAPHY CHEST, ABDOMEN AND PELVIS TECHNIQUE: Non-contrast CT of the chest was initially obtained. Multidetector CT imaging through the chest, abdomen and pelvis was performed using the standard protocol during bolus administration of intravenous contrast. Multiplanar reconstructed images and MIPs were obtained and reviewed to evaluate the vascular anatomy. CONTRAST:  163mL OMNIPAQUE IOHEXOL 350 MG/ML SOLN COMPARISON:  CT chest 11/04/2016 FINDINGS: CTA CHEST FINDINGS Cardiovascular: Noncontrast images of the chest demonstrate scattered calcification in the  aorta. Mild coronary artery calcifications. No evidence of intramural hematoma. Images obtained during the arterial phase after contrast injection demonstrates normal caliber thoracic aorta. No aortic dissection. Great vessel origins are patent. No evidence of pulmonary embolus. Normal heart size. No pericardial effusion. Cardiac pacemaker. Mediastinum/Nodes: No enlarged mediastinal, hilar, or axillary lymph nodes. Thyroid gland, trachea, and esophagus demonstrate no significant findings. Lungs/Pleura: Mild atelectasis in the lung bases. Lungs are otherwise clear. No pleural effusions. No pneumothorax. Airways are patent. Musculoskeletal: Degenerative changes in the spine. No destructive bone lesions. Review of the MIP images confirms the above findings. CTA ABDOMEN AND PELVIS FINDINGS VASCULAR Aorta: Diffuse aortic calcification. No aneurysm or dissection. No critical stenosis. Celiac: The celiac axis is patent although there is evidence of focal stenosis at the origin. SMA: Patent without evidence of aneurysm, dissection, vasculitis or significant stenosis. Renals: Both renal arteries are patent without evidence of aneurysm, dissection, vasculitis, fibromuscular dysplasia or significant stenosis. IMA: Patent without evidence of aneurysm, dissection, vasculitis or significant stenosis. Inflow: Patent without evidence of aneurysm, dissection, vasculitis or significant stenosis. Veins: No obvious venous abnormality within the limitations of this arterial phase study. Review of the MIP images confirms the above findings. NON-VASCULAR Hepatobiliary: Several low-attenuation lesions in the liver most  likely representing cysts. Gallbladder is mildly distended without stone or wall thickening. No bile duct dilatation. Pancreas: Mildly dilated pancreatic duct. No discrete mass is identified. Consider nonemergent MRCP for follow-up. Spleen: Normal in size without focal abnormality. Adrenals/Urinary Tract: Adrenal glands are  unremarkable. Kidneys are normal, without renal calculi, focal lesion, or hydronephrosis. Bladder is unremarkable. Stomach/Bowel: Stomach is within normal limits. Appendix appears normal. No evidence of bowel wall thickening, distention, or inflammatory changes. Lymphatic: No significant lymphadenopathy. Reproductive: Uterus and ovaries are not enlarged. Other: No free air or free fluid in the abdomen. Small periumbilical hernia containing fat. Midline abdominal wall scarring is likely postoperative. Musculoskeletal: Degenerative changes in the spine. No destructive bone lesions. Review of the MIP images confirms the above findings. IMPRESSION: 1. Moderate diffuse aortic calcifications/atherosclerosis. No evidence of aortic aneurysm or dissection. 2. No evidence of active pulmonary disease. 3. Dilated pancreatic duct without discrete mass identified. Consider nonemergent MRCP for follow-up. 4. Small periumbilical hernia containing fat. Electronically Signed   By: Lucienne Capers M.D.   On: 07/02/2021 01:13   US Abdomen Limited RUQ (LIVER/GB)  Result Date: 07/04/2021 CLINICAL DATA:  Abdominal pain x3 days EXAM: ULTRASOUND ABDOMEN LIMITED RIGHT UPPER QUADRANT COMPARISON:  CT July 02, 2021. FINDINGS: Gallbladder: Gallbladder is distended without gallstones or wall thickening visualized. No sonographic Murphy sign noted by sonographer. Common bile duct: Diameter: 4 mm Liver: No focal lesion identified. Within normal limits in parenchymal echogenicity. Portal vein is patent on color Doppler imaging with normal direction of blood flow towards the liver. Other: Suboptimal examination of the right upper quadrant secondary to patient habitus. IMPRESSION: No acute sonographic finding in the right upper quadrant on this examination which is suboptimal secondary to patient habitus. Electronically Signed   By: Dahlia Bailiff MD   On: 07/04/2021 16:29    Labs:  CBC: Recent Labs    07/05/21 5643 07/06/21 0050  07/07/21 0650 07/08/21 0114  WBC 25.8* 22.1* 18.2* 16.2*  HGB 11.9* 11.4* 11.0* 10.3*  HCT 36.3 35.8* 34.0* 32.1*  PLT 158 149* 159 174    COAGS: No results for input(s): INR, APTT in the last 8760 hours.  BMP: Recent Labs    07/05/21 0633 07/06/21 0050 07/07/21 0650 07/08/21 0114  NA 134* 136 140 142  K 3.9 3.9 3.8 3.7  CL 103 106 111 112*  CO2 22 24 24 24   GLUCOSE 201* 131* 113* 115*  BUN 24* 25* 18 13  CALCIUM 8.3* 8.3* 7.9* 7.8*  CREATININE 1.34* 1.14* 0.93 0.89  GFRNONAA 40* 49* >60 >60    LIVER FUNCTION TESTS: Recent Labs    07/05/21 0633 07/06/21 0050 07/07/21 0650 07/08/21 0114  BILITOT 1.2 1.0 0.8 0.5  AST 14* 13* 12* 12*  ALT 10 12 9 7   ALKPHOS 52 55 50 56  PROT 6.4* 6.1* 6.1* 5.7*  ALBUMIN 2.3* 2.1* 1.9* 1.6*    TUMOR MARKERS: No results for input(s): AFPTM, CEA, CA199, CHROMGRNA in the last 8760 hours.  Assessment and Plan: Acute cholecystitis  Patient admitted with abdominal pain, sepsis.  Found to have acute cholecystitis.  Evaluated by surgery who recommends percutaneous cholecystostomy tube placement.  Patient does have a history of complete heart block s/p St. Jude PPM placement, recently evaluated for chest pain with LHC 7/8.  Case reviewed by Dr. Pascal Lux who approves patient for procedure today.  Ms. Athens has been NPO.  She is agreeable to proceed.   Risks and benefits discussed with the patient including, but not  limited to bleeding, infection, gallbladder perforation, bile leak, sepsis or even death.  All of the patient's questions were answered, patient is agreeable to proceed. Consent signed and in chart.  Thank you for this interesting consult.  I greatly enjoyed meeting AICHA CLINGENPEEL and look forward to participating in their care.  A copy of this report was sent to the requesting provider on this date.  Electronically Signed: Docia Barrier, PA 07/08/2021, 9:26 AM   I spent a total of 40 Minutes    in face to face in  clinical consultation, greater than 50% of which was counseling/coordinating care for acute cholecystitis.

## 2021-07-09 DIAGNOSIS — A4151 Sepsis due to Escherichia coli [E. coli]: Secondary | ICD-10-CM | POA: Diagnosis not present

## 2021-07-09 LAB — COMPREHENSIVE METABOLIC PANEL
ALT: 8 U/L (ref 0–44)
AST: 18 U/L (ref 15–41)
Albumin: 1.6 g/dL — ABNORMAL LOW (ref 3.5–5.0)
Alkaline Phosphatase: 52 U/L (ref 38–126)
Anion gap: 3 — ABNORMAL LOW (ref 5–15)
BUN: 9 mg/dL (ref 8–23)
CO2: 25 mmol/L (ref 22–32)
Calcium: 8 mg/dL — ABNORMAL LOW (ref 8.9–10.3)
Chloride: 115 mmol/L — ABNORMAL HIGH (ref 98–111)
Creatinine, Ser: 0.83 mg/dL (ref 0.44–1.00)
GFR, Estimated: >60 mL/min (ref >60)
Glucose, Bld: 123 mg/dL — ABNORMAL HIGH (ref 70–99)
Potassium: 3.7 mmol/L (ref 3.5–5.1)
Sodium: 143 mmol/L (ref 135–145)
Total Bilirubin: 0.3 mg/dL (ref 0.3–1.2)
Total Protein: 5.9 g/dL — ABNORMAL LOW (ref 6.5–8.1)

## 2021-07-09 LAB — GLUCOSE, CAPILLARY
Glucose-Capillary: 106 mg/dL — ABNORMAL HIGH (ref 70–99)
Glucose-Capillary: 88 mg/dL (ref 70–99)
Glucose-Capillary: 108 mg/dL — ABNORMAL HIGH (ref 70–99)
Glucose-Capillary: 130 mg/dL — ABNORMAL HIGH (ref 70–99)

## 2021-07-09 LAB — CBC WITH DIFFERENTIAL/PLATELET
Abs Immature Granulocytes: 0.35 10*3/uL — ABNORMAL HIGH (ref 0.00–0.07)
Basophils Absolute: 0 10*3/uL (ref 0.0–0.1)
Basophils Relative: 0 %
Eosinophils Absolute: 0 10*3/uL (ref 0.0–0.5)
Eosinophils Relative: 0 %
HCT: 31.7 % — ABNORMAL LOW (ref 36.0–46.0)
Hemoglobin: 9.8 g/dL — ABNORMAL LOW (ref 12.0–15.0)
Immature Granulocytes: 3 %
Lymphocytes Relative: 13 %
Lymphs Abs: 1.7 10*3/uL (ref 0.7–4.0)
MCH: 30.3 pg (ref 26.0–34.0)
MCHC: 30.9 g/dL (ref 30.0–36.0)
MCV: 98.1 fL (ref 80.0–100.0)
Monocytes Absolute: 0.8 10*3/uL (ref 0.1–1.0)
Monocytes Relative: 6 %
Neutro Abs: 9.9 10*3/uL — ABNORMAL HIGH (ref 1.7–7.7)
Neutrophils Relative %: 78 %
Platelets: 181 10*3/uL (ref 150–400)
RBC: 3.23 MIL/uL — ABNORMAL LOW (ref 3.87–5.11)
RDW: 15.2 % (ref 11.5–15.5)
WBC: 12.7 10*3/uL — ABNORMAL HIGH (ref 4.0–10.5)
nRBC: 0.2 % (ref 0.0–0.2)

## 2021-07-09 LAB — MAGNESIUM: Magnesium: 1.8 mg/dL (ref 1.7–2.4)

## 2021-07-09 LAB — CULTURE, BLOOD (ROUTINE X 2): Culture: NO GROWTH

## 2021-07-09 LAB — PHOSPHORUS: Phosphorus: 2 mg/dL — ABNORMAL LOW (ref 2.5–4.6)

## 2021-07-09 MED ORDER — CEFDINIR 300 MG PO CAPS
300.0000 mg | ORAL_CAPSULE | Freq: Two times a day (BID) | ORAL | Status: DC
  Administered 2021-07-09 – 2021-07-11 (×5): 300 mg via ORAL
  Filled 2021-07-09 (×6): qty 1

## 2021-07-09 NOTE — Progress Notes (Signed)
7 Days Post-Op  Subjective: CC: Patient reports abdominal pain has resolved since IR perc chole placement. She is tolerating cld without n/v.   Objective: Vital signs in last 24 hours: Temp:  [98.3 F (36.8 C)-99.8 F (37.7 C)] 98.5 F (36.9 C) (07/15 0518) Pulse Rate:  [77-95] 79 (07/15 0518) Resp:  [17-29] 17 (07/15 0518) BP: (120-155)/(51-70) 135/70 (07/15 0950) SpO2:  [92 %-100 %] 97 % (07/15 0518) Last BM Date: 07/08/21  Intake/Output from previous day: 07/14 0701 - 07/15 0700 In: 1941.4 [I.V.:1641.4; IV Piggyback:300.1] Out: 575 [Urine:400; Drains:175] Intake/Output this shift: No intake/output data recorded.  PE: Gen:  Alert, NAD, pleasant Pulm: Normal rate and effort  Abd: Soft, ND, only tender around RUQ drain - otherwise NT. +BS. IR drain with bilious output.  Skin: no rashes noted, warm and dry   Lab Results:  Recent Labs    07/08/21 0114 07/09/21 0233  WBC 16.2* 12.7*  HGB 10.3* 9.8*  HCT 32.1* 31.7*  PLT 174 181   BMET Recent Labs    07/08/21 0114 07/09/21 0233  NA 142 143  K 3.7 3.7  CL 112* 115*  CO2 24 25  GLUCOSE 115* 123*  BUN 13 9  CREATININE 0.89 0.83  CALCIUM 7.8* 8.0*   PT/INR No results for input(s): LABPROT, INR in the last 72 hours. CMP     Component Value Date/Time   NA 143 07/09/2021 0233   K 3.7 07/09/2021 0233   CL 115 (H) 07/09/2021 0233   CO2 25 07/09/2021 0233   GLUCOSE 123 (H) 07/09/2021 0233   BUN 9 07/09/2021 0233   CREATININE 0.83 07/09/2021 0233   CREATININE 0.73 06/30/2020 1527   CALCIUM 8.0 (L) 07/09/2021 0233   PROT 5.9 (L) 07/09/2021 0233   ALBUMIN 1.6 (L) 07/09/2021 0233   AST 18 07/09/2021 0233   ALT 8 07/09/2021 0233   ALKPHOS 52 07/09/2021 0233   BILITOT 0.3 07/09/2021 0233   GFRNONAA >60 07/09/2021 0233   GFRNONAA 79 06/30/2020 1527   GFRAA 91 06/30/2020 1527   Lipase     Component Value Date/Time   LIPASE 23 07/04/2021 1145       Studies/Results: NM Hepato W/EF  Result Date:  07/07/2021 CLINICAL DATA:  Concern for acalculous cholecystitis EXAM: NUCLEAR MEDICINE HEPATOBILIARY IMAGING TECHNIQUE: Sequential images of the abdomen were obtained out to 60 minutes following intravenous administration of radiopharmaceutical. An additional 60 minutes of scintigraphic imaging was performed after the administration of 3 mg of morphine. RADIOPHARMACEUTICALS:  5.2 mCi Tc-86m  Choletec IV COMPARISON:  Ultrasound July 04, 2021. FINDINGS: Prompt uptake and biliary excretion of activity by the liver is seen. No gallbladder activity is visualized at 60 minutes or after following administration of morphine. Biliary activity passes into small bowel, consistent with patent common bile duct. IMPRESSION: Scintigraphic findings consistent with acute cholecystitis. Patent common bile duct. These results will be called to the ordering clinician or representative by the Radiologist Assistant, and communication documented in the PACS or [employer]. Electronically Signed   By: Dahlia Bailiff MD   On: 07/07/2021 15:23   IR Perc Cholecystostomy  Result Date: 07/08/2021 INDICATION: Acute cholecystitis. Poor operative candidate. Patient presents for image guided cholecystostomy tube placement for infection source control purposes. EXAM: ULTRASOUND AND FLUOROSCOPIC-GUIDED CHOLECYSTOSTOMY TUBE PLACEMENT COMPARISON:  Nuclear medicine HIDA scan-07/07/2021; right upper quadrant abdominal ultrasound-07/04/2021; CT abdomen and pelvis-07/02/2021 MEDICATIONS: The patient is currently admitted to the hospital and on intravenous antibiotics. Antibiotics were administered within an  appropriate time frame prior to skin puncture. ANESTHESIA/SEDATION: Moderate (conscious) sedation was employed during this procedure. A total of Versed 1 mg and Fentanyl 50 mcg was administered intravenously. Moderate Sedation Time: 12 minutes. The patient's level of consciousness and vital signs were monitored continuously by radiology nursing  throughout the procedure under my direct supervision. CONTRAST:  25mL OMNIPAQUE IOHEXOL 300 MG/ML SOLN - administered into the gallbladder lumen. FLUOROSCOPY TIME:  48 seconds (13 mGy) COMPLICATIONS: None immediate. PROCEDURE: Informed written consent was obtained from the patient after a discussion of the risks, benefits and alternatives to treatment. Questions regarding the procedure were encouraged and answered. A timeout was performed prior to the initiation of the procedure. The right upper abdominal quadrant was prepped and draped in the usual sterile fashion, and a sterile drape was applied covering the operative field. Maximum barrier sterile technique with sterile gowns and gloves were used for the procedure. A timeout was performed prior to the initiation of the procedure. Local anesthesia was provided with 1% lidocaine with epinephrine. Ultrasound scanning of the right upper quadrant demonstrates a moderately dilated gallbladder with minimal amount of gallbladder wall thickening and pericholecystic fluid. Of note, the patient reported pain with ultrasound imaging over the gallbladder. Utilizing a transhepatic approach, a 22 gauge needle was advanced into the gallbladder under direct ultrasound guidance. An ultrasound image was saved for documentation purposes. Appropriate intraluminal puncture was confirmed with the efflux of bile and advancement of an 0.018 wire into the gallbladder lumen. The needle was exchanged for an Haralson set. A small amount of contrast was injected to confirm appropriate intraluminal positioning. Over a Benson wire, a 39.2-French Cook cholecystomy tube was advanced into the gallbladder fossa, coiled and locked. Bile was aspirated and a small amount of contrast was injected as several post procedural spot radiographic images were obtained in various obliquities. The catheter was secured to the skin with suture, connected to a drainage bag and a dressing was placed. The patient  tolerated the procedure well without immediate post procedural complication. IMPRESSION: Successful ultrasound and fluoroscopic guided placement of a 10.2 French cholecystostomy tube. Electronically Signed   By: Sandi Mariscal M.D.   On: 07/08/2021 14:02    Anti-infectives: Anti-infectives (From admission, onward)    Start     Dose/Rate Route Frequency Ordered Stop   07/09/21 1400  cefdinir (OMNICEF) capsule 300 mg        300 mg Oral Every 12 hours 07/09/21 0904 07/16/21 0959   07/07/21 1200  ceFAZolin (ANCEF) IVPB 2g/100 mL premix  Status:  Discontinued        2 g 200 mL/hr over 30 Minutes Intravenous Every 8 hours 07/07/21 0915 07/09/21 0904   07/05/21 2200  metroNIDAZOLE (FLAGYL) tablet 500 mg        500 mg Oral Every 12 hours 07/05/21 1258     07/05/21 1200  cefTRIAXone (ROCEPHIN) 2 g in sodium chloride 0.9 % 100 mL IVPB  Status:  Discontinued        2 g 200 mL/hr over 30 Minutes Intravenous Every 24 hours 07/05/21 1054 07/07/21 0915   07/04/21 1245  cefTRIAXone (ROCEPHIN) 2 g in sodium chloride 0.9 % 100 mL IVPB  Status:  Discontinued        2 g 200 mL/hr over 30 Minutes Intravenous Every 24 hours 07/04/21 1145 07/05/21 0759   07/04/21 1245  metroNIDAZOLE (FLAGYL) IVPB 500 mg  Status:  Discontinued        500 mg 100 mL/hr over 60  Minutes Intravenous Every 8 hours 07/04/21 1145 07/05/21 0759        Assessment/Plan Acalculous cholecystitis (confirmed on HIDA) E. Coli Bacteremia  - s/p IR perc chole drain placement 7/14 - Abx per ID. They are planning for 2 weeks of abx total which I feel is reasonable - Adv diet. - Will need follow up with IR  - Will arrange follow up with Dr. [name] in ~6 weeks to discuss possible interval cholecystectomy - Discussed with [facility], will recheck this pm. If tolerating a diet we will sign off and be available as needed moving forward.    FEN: Reg ID: [id number]  VTE: Can resume blood thinners from our standpoint   Per primary: E. 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

## 2021-07-09 NOTE — Progress Notes (Signed)
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  Echocardiogram with LVEF 55-60% with hypokinesis of the distal inferior/inferior septal walls.   On 07/04/2021, patient was noted to have Natalie Castro fever up to 101.3 F with tachypnea.  TRH was consulted on 7/10 for further evaluation and work-up of fever with SIRS symptomatology.  Assessment & Plan:   Principal Problem:   Septicemia due to E. coli ([place]) Active Problems:   Essential hypertension, benign   Saddle pulmonary embolus (HCC)   Elevated troponin I level   Uncontrolled type 2 diabetes mellitus with hyperglycemia (HCC)   Acute coronary syndrome (HCC)   Protein-calorie malnutrition, mild (HCC)   Chest pain   Sepsis ([place])   Bacteremia   Acute kidney injury ([place])   Pancreatic duct dilated  E. coli Bacteremia  Acute Cholecystitis On 07/04/2021, patient developed fever up to 101.3 F with associated tachypnea.  Patient with reported history of nausea/vomiting on presentation with associated chest pain.  Queried possibility of aspiration pneumonia.  Chest x-ray with no acute cardiopulmonary disease process.  Lipase 23, within normal limits.  Review of CT angiogram  chest/abdomen/pelvis performed on 07/02/2021 at time of admission noted mild gallbladder distention and dilation of the pancreatic duct.  Abdominal x-ray 7/10 with nonobstructive bowel gas pattern with recommendation of right upper quadrant ultrasound to exclude acute cholecystitis.  Right upper quadrant ultrasound 7/10 with gallbladder distention without gallstones or wall thickening and no sonographic Murphy sign noted by sonographer with CBD 4 mm.  Blood cultures x2 were ordered with 1 returning positive (anaerobic bottle) for E. coli.  LFTs and total bilirubin within normal limits.  Urinalysis and urine culture unrevealing, although obtained after initiation of IV antibiotics.  Seen by gastroenterology on 07/05/2021, for dilated pancreatic duct and Dr. [name] does not believe this is related to her E. coli bacteremia. - HIDA scan done 7/13 and positive for cholecystitis - s/p perc chole tube by IR 7/14 - surgery recommending follow up with IR and with Dr. Donne Castro in ~6 weeks to discuss possible interval cholecystectomy --ID following, appreciate assistance - recommending 2 weeks total treatment with cefdinir and flagyl (end date 07/15/21) --fever 7/14 AM, follow with continued abx --diet advanced per surgery --eliquis currently on hold  (on lovenox) --blood cx 7/12 NGTD --blood cx 7/10 E. Coli in 1/2 cultures    Atypical chest pain Nonobstructive CAD Patient initially presenting to Natalie Castro with chest pain, transferred to [facility] left heart catheterization with minimal nonobstructive CAD.  TTE with normal LVEF, but hypokinesis of distal inferior/inferoseptal walls. --Atorvastatin 40 mg p.o. daily --Metoprolol succinate 25 mg p.o. daily --Home aspirin discontinued by cardiology (no strong indication per cards), currently on treatment dose Lovenox   Essential hypertension BP appropriate today.  Home regimen includes metoprolol succinate 25  mg p.o. daily,  nifedipine 30 mg p.o. daily, losartan 100 mg p.o. daily, hydralazine 50 mg p.o. twice daily. --Metoprolol succinate 25 mg p.o. daily, losartan 100 mg daily, hydralazine 50 mg BID --hold remaining antihypertensives including nifedipine --Continue monitor BP closely   Type 2 diabetes mellitus Home regimen includes metformin 1000 mg p.o. twice daily, Lantus 30 units subcutaneously nightly.  Hemoglobin A1c 7.4 07/02/2021, well controlled. 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  Device interrogated with prolonged AV timing, EP, Dr. Caryl Castro recommended reprogramming as outpatient.   Hx of PE 2017, on chronic anticoagulation On Eliquis outpatient.  Currently holding Eliquis in favor of treatment dose Lovenox.     Hypomagnesemia: resolved  DVT prophylaxis:lovenox, anticoagulatin dose - on hold with procedure Code Status: full  Family Communication: family at bedside Disposition:   Status is: Inpatient  Remains inpatient appropriate because:Inpatient level of care appropriate due to severity of illness  Dispo: The patient is from: Home              Anticipated d/c is to: Home              Patient currently is not medically stable to d/c.   Difficult to place patient No       Consultants:  Cardiology GI ID surgery  Procedures:  Perc chole tube  Cath The left ventricular systolic function is normal. LV end diastolic pressure is mildly elevated. LVEDP 17 mm Hg. The left ventricular ejection fraction is 55-65% by visual estimate. There is no aortic valve stenosis. Mild nonobstructive coroanry artery disease. Tortuous vessels. Catheter induced spasm of the ostial RCA.   Mild radial vasospasm.  Continue preventive therapy.  Low level troponin elevation may be related to her rhythm disturbance noted on ECG.     IMPRESSIONS     1. Hypokinesis of the distal inferior/inferoseptal walls. Changes appear  more prominent than in previous echo in 2017. Marland Kitchen Left ventricular ejection  fraction, by estimation, is 55 to 60%. The left ventricle has normal  function. There is moderate left  ventricular hypertrophy. Elevated left atrial pressure.   2. Right ventricular systolic function is normal. The right ventricular  size is normal. There is normal pulmonary artery systolic pressure.   3. The mitral valve is normal in structure. Mild to moderate mitral valve  regurgitation.   4. The aortic valve is tricuspid. Aortic valve regurgitation is not  visualized. Mild to moderate aortic valve sclerosis/calcification is  present, without any evidence of aortic stenosis.    Antimicrobials:  Anti-infectives (From admission, onward)    Start     Dose/Rate Route Frequency Ordered Stop   07/09/21 1400  cefdinir (OMNICEF) capsule 300 mg        300 mg Oral Every 12 hours 07/09/21 0904 07/16/21 0959   07/07/21 1200  ceFAZolin (ANCEF) IVPB 2g/100 mL premix  Status:  Discontinued        2 g 200 mL/hr over 30 Minutes Intravenous Every 8 hours 07/07/21 0915 07/09/21 0904   07/05/21 2200  metroNIDAZOLE (FLAGYL) tablet 500 mg        500 mg Oral Every 12 hours 07/05/21 1258     07/05/21 1200  cefTRIAXone (ROCEPHIN) 2 g in sodium chloride 0.9 % 100 mL IVPB  Status:  Discontinued        2 g 200 mL/hr over  30 Minutes Intravenous Every 24 hours 07/05/21 1054 07/07/21 0915   07/04/21 1245  cefTRIAXone (ROCEPHIN) 2 g in sodium chloride 0.9 % 100 mL IVPB  Status:  Discontinued        2 g 200 mL/hr over 30 Minutes Intravenous Every 24 hours 07/04/21 1145 07/05/21 0759   07/04/21 1245  metroNIDAZOLE (FLAGYL) IVPB 500 mg  Status:  Discontinued        500 mg 100 mL/hr over 60 Minutes Intravenous Every 8 hours 07/04/21 1145 07/05/21 0759          Subjective: No new complaints today - feels Ashlyne Olenick little  better  Objective: Vitals:   07/08/21 2358 07/09/21 0320 07/09/21 0518 07/09/21 0950  BP: (!) 133/52 (!) 140/52 (!) 141/53 135/70  Pulse: 77 82 79   Resp: 19 20 17    Temp: 99.8 F (37.7 C) 98.3 F (36.8 C) 98.5 F (36.9 C)   TempSrc: Oral Oral Oral   SpO2: 92% 97% 97%   Weight:      Height:        Intake/Output Summary (Last 24 hours) at 07/09/2021 1459 Last data filed at 07/09/2021 1300 Gross per 24 hour  Intake 2181.44 ml  Output 575 ml  Net 1606.44 ml   Filed Weights   07/01/21 2245 07/02/21 0238  Weight: 96.2 kg 95.2 kg    Examination:  General: No acute distress. Cardiovascular: RRR Lungs: unlabored Abdomen: abdominal TTP, drain in place Neurological: Alert and oriented 3. Moves all extremities 4 . Cranial nerves II through XII grossly intact. Skin: Warm and dry. No rashes or lesions. Extremities: No clubbing or cyanosis. No edema.   Data Reviewed: I have personally reviewed following labs and imaging studies  CBC: Recent Labs  Lab 07/04/21 1028 07/05/21 0633 07/06/21 0050 07/07/21 0650 07/08/21 0114 07/09/21 0233  WBC 18.7* 25.8* 22.1* 18.2* 16.2* 12.7*  NEUTROABS 16.0*  --   --   --   --  9.9*  HGB 12.2 11.9* 11.4* 11.0* 10.3* 9.8*  HCT 37.3 36.3 35.8* 34.0* 32.1* 31.7*  MCV 93.5 94.3 95.0 96.3 96.4 98.1  PLT 153 158 149* 159 174 564    Basic Metabolic Panel: Recent Labs  Lab 07/05/21 0253 07/05/21 0633 07/06/21 0050 07/07/21 0650 07/08/21 0114 07/09/21 0233  NA  --  134* 136 140 142 143  K  --  3.9 3.9 3.8 3.7 3.7  CL  --  103 106 111 112* 115*  CO2  --  22 24 24 24 25   GLUCOSE  --  201* 131* 113* 115* 123*  BUN  --  24* 25* 18 13 9   CREATININE  --  1.34* 1.14* 0.93 0.89 0.83  CALCIUM  --  8.3* 8.3* 7.9* 7.8* 8.0*  MG 1.9  --  2.0 2.1 2.0 1.8  PHOS  --   --   --   --   --  2.0*    GFR: Estimated Creatinine Clearance: 62.7 mL/min (by C-G formula based on SCr of 0.83 mg/dL).  Liver Function Tests: Recent Labs  Lab  07/05/21 0633 07/06/21 0050 07/07/21 0650 07/08/21 0114 07/09/21 0233  AST 14* 13* 12* 12* 18  ALT 10 12 9 7 8   ALKPHOS 52 55 50 56 52  BILITOT 1.2 1.0 0.8 0.5 0.3  PROT 6.4* 6.1* 6.1* 5.7* 5.9*  ALBUMIN 2.3* 2.1* 1.9* 1.6* 1.6*    CBG: Recent Labs  Lab 07/08/21 1152 07/08/21 1647 07/08/21 2100 07/09/21 0816 07/09/21 1212  GLUCAP 94 129*  140* 106* 88     Recent Results (from the past 240 hour(s))  Resp Panel by RT-PCR (Flu Graceson Nichelson&B, Covid) Nasopharyngeal Swab     Status: None   Collection Time: 07/02/21  1:45 AM   Specimen: Nasopharyngeal Swab; Nasopharyngeal(NP) swabs in vial transport medium  Result Value Ref Range Status   SARS Coronavirus 2 by RT PCR NEGATIVE NEGATIVE Final    Comment: (NOTE) SARS-CoV-2 target nucleic acids are NOT DETECTED.  The SARS-CoV-2 RNA is generally detectable in upper respiratory specimens during the acute phase of infection. The lowest concentration of SARS-CoV-2 viral copies this assay can detect is 138 copies/mL. Levonte Molina negative result does not preclude SARS-Cov-2 infection and should not be used as the sole basis for treatment or other patient management decisions. Yvonnie Schinke negative result may occur with  improper specimen collection/handling, submission of specimen other than nasopharyngeal swab, presence of viral mutation(s) within the areas targeted by this assay, and inadequate number of viral copies(<138 copies/mL). Oktober Glazer negative result must be combined with clinical observations, patient history, and epidemiological information. The expected result is Negative.  Fact Sheet for Patients:  EntrepreneurPulse.com.au  Fact Sheet for Healthcare Providers:  IncredibleEmployment.be  This test is no t yet approved or cleared by the [place] FDA and  has been authorized for detection and/or diagnosis of SARS-CoV-2 by FDA under an Emergency Use Authorization (EUA). This EUA will remain  in effect (meaning this  test can be used) for the duration of the COVID-19 declaration under Section 564(b)(1) of the Act, 21 U.S.C.section 360bbb-3(b)(1), unless the authorization is terminated  or revoked sooner.       Influenza Love Chowning by PCR NEGATIVE NEGATIVE Final   Influenza B by PCR NEGATIVE NEGATIVE Final    Comment: (NOTE) The Xpert Xpress SARS-CoV-2/FLU/RSV plus assay is intended as an aid in the diagnosis of influenza from Nasopharyngeal swab specimens and should not be used as Kaylynn Chamblin sole basis for treatment. Nasal washings and aspirates are unacceptable for Xpert Xpress SARS-CoV-2/FLU/RSV testing.  Fact Sheet for Patients: EntrepreneurPulse.com.au  Fact Sheet for Healthcare Providers: IncredibleEmployment.be  This test is not yet approved or cleared by the [place] FDA and has been authorized for detection and/or diagnosis of SARS-CoV-2 by FDA under an Emergency Use Authorization (EUA). This EUA will remain in effect (meaning this test can be used) for the duration of the COVID-19 declaration under Section 564(b)(1) of the Act, 21 U.S.C. section 360bbb-3(b)(1), unless the authorization is terminated or revoked.  Performed at [facility], [address], [place], [place] [address]   Culture, blood (routine x 2)     Status: None   Collection Time: 07/04/21 10:28 AM   Specimen: BLOOD RIGHT HAND  Result Value Ref Range Status   Specimen Description BLOOD RIGHT HAND  Final   Special Requests   Final    BOTTLES DRAWN AEROBIC ONLY Blood Culture results may not be optimal due to an inadequate volume of blood received in culture bottles   Culture   Final    NO GROWTH 5 DAYS Performed at [facility], [place] [address], [place], [place] [address]    Report Status 07/09/2021 FINAL  Final  Culture, blood (routine x 2)     Status: Abnormal   Collection Time: 07/04/21 10:28 AM   Specimen: BLOOD LEFT HAND  Result Value Ref Range Status   Specimen Description  BLOOD LEFT HAND  Final   Special Requests   Final    BOTTLES DRAWN AEROBIC ONLY  Blood Culture adequate volume   Culture  Setup Time   Final    GRAM NEGATIVE RODS AEROBIC BOTTLE ONLY CRITICAL RESULT CALLED TO, READ BACK BY AND VERIFIED WITH: [place] BY MESSAN H. AT [id number] ON 7 11 2022 Performed at [facility], [address], [place], [place] [address]    Culture ESCHERICHIA COLI (Kendel Bessey)  Final   Report Status 07/07/2021 FINAL  Final   Organism ID, Bacteria ESCHERICHIA COLI  Final      Susceptibility   Escherichia coli - MIC*    AMPICILLIN 8 SENSITIVE Sensitive     CEFAZOLIN <=4 SENSITIVE Sensitive     CEFEPIME <=0.12 SENSITIVE Sensitive     CEFTAZIDIME <=1 SENSITIVE Sensitive     CEFTRIAXONE <=0.25 SENSITIVE Sensitive     CIPROFLOXACIN <=0.25 SENSITIVE Sensitive     GENTAMICIN <=1 SENSITIVE Sensitive     IMIPENEM <=0.25 SENSITIVE Sensitive     TRIMETH/SULFA <=20 SENSITIVE Sensitive     AMPICILLIN/SULBACTAM 4 SENSITIVE Sensitive     PIP/TAZO <=4 SENSITIVE Sensitive     * ESCHERICHIA COLI  Blood Culture ID Panel (Reflexed)     Status: Abnormal   Collection Time: 07/04/21 10:28 AM  Result Value Ref Range Status   Enterococcus faecalis NOT DETECTED NOT DETECTED Final   Enterococcus Faecium NOT DETECTED NOT DETECTED Final   Listeria monocytogenes NOT DETECTED NOT DETECTED Final   Staphylococcus species NOT DETECTED NOT DETECTED Final   Staphylococcus aureus (BCID) NOT DETECTED NOT DETECTED Final   Staphylococcus epidermidis NOT DETECTED NOT DETECTED Final   Staphylococcus lugdunensis NOT DETECTED NOT DETECTED Final   Streptococcus species NOT DETECTED NOT DETECTED Final   Streptococcus agalactiae NOT DETECTED NOT DETECTED Final   Streptococcus pneumoniae NOT DETECTED NOT DETECTED Final   Streptococcus pyogenes NOT DETECTED NOT DETECTED Final   Damen Windsor.calcoaceticus-baumannii NOT DETECTED NOT DETECTED Final   Bacteroides fragilis NOT DETECTED NOT DETECTED Final    Enterobacterales DETECTED (Brexley Cutshaw) NOT DETECTED Final    Comment: Enterobacterales represent Nyiesha Beever large order of gram negative bacteria, not Celsey Asselin single organism. CRITICAL RESULT CALLED TO, READ BACK BY AND VERIFIED WITH: PHARMD MIRANDA BRYK BY MESSAN H. AT 0177 ON 7 11 2022    Enterobacter cloacae complex NOT DETECTED NOT DETECTED Final   Escherichia coli DETECTED (Alieyah Spader) NOT DETECTED Final    Comment: CRITICAL RESULT CALLED TO, READ BACK BY AND VERIFIED WITH: PHARMD MIRANDA BRYK BY MESSAN H. AT 0307 ON 7 11 2022    Klebsiella aerogenes NOT DETECTED NOT DETECTED Final   Klebsiella oxytoca NOT DETECTED NOT DETECTED Final   Klebsiella pneumoniae NOT DETECTED NOT DETECTED Final   Proteus species NOT DETECTED NOT DETECTED Final   Salmonella species NOT DETECTED NOT DETECTED Final   Serratia marcescens NOT DETECTED NOT DETECTED Final   Haemophilus influenzae NOT DETECTED NOT DETECTED Final   Neisseria meningitidis NOT DETECTED NOT DETECTED Final   Pseudomonas aeruginosa NOT DETECTED NOT DETECTED Final   Stenotrophomonas maltophilia NOT DETECTED NOT DETECTED Final   Candida albicans NOT DETECTED NOT DETECTED Final   Candida auris NOT DETECTED NOT DETECTED Final   Candida glabrata NOT DETECTED NOT DETECTED Final   Candida krusei NOT DETECTED NOT DETECTED Final   Candida parapsilosis NOT DETECTED NOT DETECTED Final   Candida tropicalis NOT DETECTED NOT DETECTED Final   Cryptococcus neoformans/gattii NOT DETECTED NOT DETECTED Final   CTX-M ESBL NOT DETECTED NOT DETECTED Final   Carbapenem resistance IMP NOT DETECTED NOT DETECTED Final  Carbapenem resistance KPC NOT DETECTED NOT DETECTED Final   Carbapenem resistance NDM NOT DETECTED NOT DETECTED Final   Carbapenem resist OXA 48 LIKE NOT DETECTED NOT DETECTED Final   Carbapenem resistance VIM NOT DETECTED NOT DETECTED Final    Comment: Performed at [facility], [place] [address], [place], [place] [address]  Culture, Urine     Status: None    Collection Time: 07/05/21 12:20 PM   Specimen: Urine, Catheterized  Result Value Ref Range Status   Specimen Description URINE, CATHETERIZED  Final   Special Requests NONE  Final   Culture   Final    NO GROWTH Performed at [facility], [address], [place], [place] [address]    Report Status 07/06/2021 FINAL  Final  Culture, blood (routine x 2)     Status: None (Preliminary result)   Collection Time: 07/06/21  9:24 AM   Specimen: BLOOD  Result Value Ref Range Status   Specimen Description BLOOD LEFT ANTECUBITAL  Final   Special Requests   Final    BOTTLES DRAWN AEROBIC AND ANAEROBIC Blood Culture results may not be optimal due to an excessive volume of blood received in culture bottles   Culture   Final    NO GROWTH 3 DAYS Performed at [facility], [place] [address], [place], [place] [address]    Report Status PENDING  Incomplete  Culture, blood (routine x 2)     Status: None (Preliminary result)   Collection Time: 07/06/21  9:30 AM   Specimen: BLOOD RIGHT FOREARM  Result Value Ref Range Status   Specimen Description BLOOD RIGHT FOREARM  Final   Special Requests   Final    BOTTLES DRAWN AEROBIC AND ANAEROBIC Blood Culture adequate volume   Culture   Final    NO GROWTH 3 DAYS Performed at [facility], [place] [address] [place],  [address]    Report Status PENDING  Incomplete         Radiology Studies: IR Perc Cholecystostomy  Result Date: 07/08/2021 INDICATION: Acute cholecystitis. Poor operative candidate. Patient presents for image guided cholecystostomy tube placement for infection source control purposes. EXAM: ULTRASOUND AND FLUOROSCOPIC-GUIDED CHOLECYSTOSTOMY TUBE PLACEMENT COMPARISON:  Nuclear medicine HIDA scan-07/07/2021; right upper quadrant abdominal ultrasound-07/04/2021; CT abdomen and pelvis-07/02/2021 MEDICATIONS: The patient is currently admitted to the hospital and on intravenous antibiotics. Antibiotics were administered within an  appropriate time frame prior to skin puncture. ANESTHESIA/SEDATION: Moderate (conscious) sedation was employed during this procedure. Marycarmen Hagey total of Versed 1 mg and Fentanyl 50 mcg was administered intravenously. Moderate Sedation Time: 12 minutes. The patient's level of consciousness and vital signs were monitored continuously by radiology nursing throughout the procedure under my direct supervision. CONTRAST:  16mL OMNIPAQUE IOHEXOL 300 MG/ML SOLN - administered into the gallbladder lumen. FLUOROSCOPY TIME:  48 seconds (13 mGy) COMPLICATIONS: None immediate. PROCEDURE: Informed written consent was obtained from the patient after Russie Gulledge discussion of the risks, benefits and alternatives to treatment. Questions regarding the procedure were encouraged and answered. Braydan Marriott timeout was performed prior to the initiation of the procedure. The right upper abdominal quadrant was prepped and draped in the usual sterile fashion, and Mccabe Gloria sterile drape was applied covering the operative field. Maximum barrier sterile technique with sterile gowns and gloves were used for the procedure. Karlina Suares timeout was performed prior to the initiation of the procedure. Local anesthesia was provided with 1% lidocaine with epinephrine. Ultrasound scanning of the right upper quadrant demonstrates Mahogani Holohan moderately  dilated gallbladder with minimal amount of gallbladder wall thickening and pericholecystic fluid. Of note, the patient reported pain with ultrasound imaging over the gallbladder. Utilizing Gearline Spilman transhepatic approach, Rockney Grenz 22 gauge needle was advanced into the gallbladder under direct ultrasound guidance. An ultrasound image was saved for documentation purposes. Appropriate intraluminal puncture was confirmed with the efflux of bile and advancement of an 0.018 wire into the gallbladder lumen. The needle was exchanged for an [place] set. Courtnay Petrilla small amount of contrast was injected to confirm appropriate intraluminal positioning. Over Yerania Chamorro Benson wire, Rocket Gunderson 30.2-French Cook  cholecystomy tube was advanced into the gallbladder fossa, coiled and locked. Bile was aspirated and Ezri Landers small amount of contrast was injected as several post procedural spot radiographic images were obtained in various obliquities. The catheter was secured to the skin with suture, connected to Annelle Behrendt drainage bag and Genella Bas dressing was placed. The patient tolerated the procedure well without immediate post procedural complication. IMPRESSION: Successful ultrasound and fluoroscopic guided placement of Pamelia Botto 10.2 French cholecystostomy tube. Electronically Signed   By: Sandi Mariscal M.D.   On: 07/08/2021 14:02        Scheduled Meds:  atorvastatin  40 mg Oral Daily   cefdinir  300 mg Oral Q12H   enoxaparin (LOVENOX) injection  90 mg Subcutaneous Q12H   hydrALAZINE  50 mg Oral BID   insulin aspart  0-15 Units Subcutaneous TID WC   insulin glargine  20 Units Subcutaneous QHS   losartan  100 mg Oral Daily   metoprolol succinate  25 mg Oral Daily   metroNIDAZOLE  500 mg Oral Q12H   sodium chloride flush  3 mL Intravenous Q12H   sodium chloride flush  5 mL Intracatheter Q8H   Continuous Infusions:  sodium chloride     sodium chloride 75 mL/hr at 07/09/21 1221     LOS: 7 days    Time spent: over 30 min    Fayrene Helper, MD [facility]   To contact the attending provider between 7A-7P or the covering provider during after hours 7P-7A, please log into the web site [email or web address] and access using universal [place] password for that web site. If you do not have the password, please call the hospital operator.  07/09/2021, 2:59 PM

## 2021-07-10 DIAGNOSIS — A4151 Sepsis due to Escherichia coli [E. coli]: Secondary | ICD-10-CM | POA: Diagnosis not present

## 2021-07-10 LAB — COMPREHENSIVE METABOLIC PANEL
ALT: 7 U/L (ref 0–44)
AST: 14 U/L — ABNORMAL LOW (ref 15–41)
Albumin: 1.7 g/dL — ABNORMAL LOW (ref 3.5–5.0)
Alkaline Phosphatase: 49 U/L (ref 38–126)
Anion gap: 5 (ref 5–15)
BUN: 8 mg/dL (ref 8–23)
CO2: 23 mmol/L (ref 22–32)
Calcium: 7.8 mg/dL — ABNORMAL LOW (ref 8.9–10.3)
Chloride: 113 mmol/L — ABNORMAL HIGH (ref 98–111)
Creatinine, Ser: 0.68 mg/dL (ref 0.44–1.00)
GFR, Estimated: >60 mL/min (ref >60)
Glucose, Bld: 118 mg/dL — ABNORMAL HIGH (ref 70–99)
Potassium: 3.4 mmol/L — ABNORMAL LOW (ref 3.5–5.1)
Sodium: 141 mmol/L (ref 135–145)
Total Bilirubin: 0.5 mg/dL (ref 0.3–1.2)
Total Protein: 5.6 g/dL — ABNORMAL LOW (ref 6.5–8.1)

## 2021-07-10 LAB — GLUCOSE, CAPILLARY
Glucose-Capillary: 128 mg/dL — ABNORMAL HIGH (ref 70–99)
Glucose-Capillary: 185 mg/dL — ABNORMAL HIGH (ref 70–99)
Glucose-Capillary: 113 mg/dL — ABNORMAL HIGH (ref 70–99)
Glucose-Capillary: 168 mg/dL — ABNORMAL HIGH (ref 70–99)

## 2021-07-10 LAB — CBC WITH DIFFERENTIAL/PLATELET
Abs Immature Granulocytes: 0.39 10*3/uL — ABNORMAL HIGH (ref 0.00–0.07)
Basophils Absolute: 0 10*3/uL (ref 0.0–0.1)
Basophils Relative: 0 %
Eosinophils Absolute: 0.2 10*3/uL (ref 0.0–0.5)
Eosinophils Relative: 2 %
HCT: 30.9 % — ABNORMAL LOW (ref 36.0–46.0)
Hemoglobin: 9.8 g/dL — ABNORMAL LOW (ref 12.0–15.0)
Immature Granulocytes: 4 %
Lymphocytes Relative: 20 %
Lymphs Abs: 2 10*3/uL (ref 0.7–4.0)
MCH: 30.5 pg (ref 26.0–34.0)
MCHC: 31.7 g/dL (ref 30.0–36.0)
MCV: 96.3 fL (ref 80.0–100.0)
Monocytes Absolute: 0.9 10*3/uL (ref 0.1–1.0)
Monocytes Relative: 9 %
Neutro Abs: 6.4 10*3/uL (ref 1.7–7.7)
Neutrophils Relative %: 65 %
Platelets: 184 10*3/uL (ref 150–400)
RBC: 3.21 MIL/uL — ABNORMAL LOW (ref 3.87–5.11)
RDW: 15.3 % (ref 11.5–15.5)
WBC: 9.9 10*3/uL (ref 4.0–10.5)
nRBC: 0 % (ref 0.0–0.2)

## 2021-07-10 LAB — PHOSPHORUS: Phosphorus: 2 mg/dL — ABNORMAL LOW (ref 2.5–4.6)

## 2021-07-10 LAB — MAGNESIUM: Magnesium: 1.7 mg/dL (ref 1.7–2.4)

## 2021-07-10 MED ORDER — WHITE PETROLATUM EX OINT: TOPICAL_OINTMENT | CUTANEOUS | Status: DC | PRN

## 2021-07-10 MED ORDER — WHITE PETROLATUM EX OINT
TOPICAL_OINTMENT | CUTANEOUS | Status: AC
  Administered 2021-07-10: 0.2
  Filled 2021-07-10: qty 28.35

## 2021-07-10 MED ORDER — APIXABAN 5 MG PO TABS
5.0000 mg | ORAL_TABLET | Freq: Two times a day (BID) | ORAL | Status: DC
  Administered 2021-07-10 – 2021-07-11 (×2): 5 mg via ORAL
  Filled 2021-07-10 (×2): qty 1

## 2021-07-10 NOTE — Progress Notes (Signed)
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  Echocardiogram with LVEF 55-60% with hypokinesis of the distal inferior/inferior septal walls.   On 07/04/2021, patient was noted to have Natalie Castro fever up to 101.3 F with tachypnea.  TRH was consulted on 7/10 for further evaluation and work-up of fever with SIRS symptomatology.  Assessment & Plan:   Principal Problem:   Septicemia due to E. coli ([place]) Active Problems:   Essential hypertension, benign   Saddle pulmonary embolus (HCC)   Elevated troponin I level   Uncontrolled type 2 diabetes mellitus with hyperglycemia (HCC)   Acute coronary syndrome (HCC)   Protein-calorie malnutrition, mild (HCC)   Chest pain   Sepsis ([place])   Bacteremia   Acute kidney injury ([place])   Pancreatic duct dilated  E. coli Bacteremia  Acute Cholecystitis On 07/04/2021, patient developed fever up to 101.3 F with associated tachypnea.  Patient with reported history of nausea/vomiting on presentation with associated chest pain.  Queried possibility of aspiration pneumonia.  Chest x-ray with no acute cardiopulmonary disease process.  Lipase 23, within normal limits.  Review of CT angiogram  chest/abdomen/pelvis performed on 07/02/2021 at time of admission noted mild gallbladder distention and dilation of the pancreatic duct.  Abdominal x-ray 7/10 with nonobstructive bowel gas pattern with recommendation of right upper quadrant ultrasound to exclude acute cholecystitis.  Right upper quadrant ultrasound 7/10 with gallbladder distention without gallstones or wall thickening and no sonographic Murphy sign noted by sonographer with CBD 4 mm.  Blood cultures x2 were ordered with 1 returning positive (anaerobic bottle) for E. coli.  LFTs and total bilirubin within normal limits.  Urinalysis and urine culture unrevealing, although obtained after initiation of IV antibiotics.  Seen by gastroenterology on 07/05/2021, for dilated pancreatic duct and Dr. [name] does not believe this is related to her E. coli bacteremia. - HIDA scan done 7/13 and positive for cholecystitis - s/p perc chole tube by IR 7/14 - surgery recommending follow up with IR and with Dr. [name] in ~6 weeks to discuss possible interval cholecystectomy --ID following, appreciate assistance - recommending 2 weeks total treatment with cefdinir and flagyl (end date 07/15/21) --fever 7/14 AM, follow with continued abx --diet advanced per surgery --eliquis currently on hold  (on lovenox) --blood cx 7/12 NGTD --blood cx 7/10 E. Coli in 1/2 cultures    Atypical chest pain Nonobstructive CAD Patient initially presenting to [facility] with chest pain, transferred to [facility] left heart catheterization with minimal nonobstructive CAD.  TTE with normal LVEF, but hypokinesis of distal inferior/inferoseptal walls. --Atorvastatin 40 mg p.o. daily --Metoprolol succinate 25 mg p.o. daily --Home aspirin discontinued by cardiology (no strong indication per cards), will resume eliquis   Essential hypertension BP appropriate today.  Home regimen includes metoprolol succinate 25 mg p.o.  daily, nifedipine 30 mg p.o.  daily, losartan 100 mg p.o. daily, hydralazine 50 mg p.o. twice daily. --Metoprolol succinate 25 mg p.o. daily, losartan 100 mg daily, hydralazine 50 mg BID --hold remaining antihypertensives including nifedipine --Continue monitor BP closely   Type 2 diabetes mellitus Home regimen includes metformin 1000 mg p.o. twice daily, Lantus 30 units subcutaneously nightly.  Hemoglobin A1c 7.4 07/02/2021, well controlled. 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  Device interrogated with prolonged AV timing, EP, Dr. [name] recommended reprogramming as outpatient.   Hx of PE 2017, on chronic anticoagulation On Eliquis outpatient.  D/c lovenox, resume eliquis in prepartation for dc/   Hypomagnesemia: resolved  Plan to d/c home with 24 hr care in AM  DVT prophylaxis: resume eliquis Code Status: full  Family Communication: family at bedside Disposition:   Status is: Inpatient  Remains inpatient appropriate because:Inpatient level of care appropriate due to severity of illness  Dispo: The patient is from: Home              Anticipated d/c is to: Home              Patient currently is not medically stable to d/c.   Difficult to place patient No       Consultants:  Cardiology GI ID surgery  Procedures:  Perc chole tube  Cath The left ventricular systolic function is normal. LV end diastolic pressure is mildly elevated. LVEDP 17 mm Hg. The left ventricular ejection fraction is 55-65% by visual estimate. There is no aortic valve stenosis. Mild nonobstructive coroanry artery disease. Tortuous vessels. Catheter induced spasm of the ostial RCA.   Mild radial vasospasm.  Continue preventive therapy.  Low level troponin elevation may be related to her rhythm disturbance noted on ECG.    IMPRESSIONS     1. Hypokinesis of  the distal inferior/inferoseptal walls. Changes appear  more prominent than in previous echo in 2017. Marland Kitchen Left ventricular ejection  fraction, by estimation, is 55 to 60%. The left ventricle has normal  function. There is moderate left  ventricular hypertrophy. Elevated left atrial pressure.   2. Right ventricular systolic function is normal. The right ventricular  size is normal. There is normal pulmonary artery systolic pressure.   3. The mitral valve is normal in structure. Mild to moderate mitral valve  regurgitation.   4. The aortic valve is tricuspid. Aortic valve regurgitation is not  visualized. Mild to moderate aortic valve sclerosis/calcification is  present, without any evidence of aortic stenosis.    Antimicrobials:  Anti-infectives (From admission, onward)    Start     Dose/Rate Route Frequency Ordered Stop   07/09/21 1400  cefdinir (OMNICEF) capsule 300 mg        300 mg Oral Every 12 hours 07/09/21 0904 07/16/21 0959   07/07/21 1200  ceFAZolin (ANCEF) IVPB 2g/100 mL premix  Status:  Discontinued        2 g 200 mL/hr over 30 Minutes Intravenous Every 8 hours 07/07/21 0915 07/09/21 0904   07/05/21 2200  metroNIDAZOLE (FLAGYL) tablet 500 mg        500 mg Oral Every 12 hours 07/05/21 1258 07/15/21 2359   07/05/21 1200  cefTRIAXone (ROCEPHIN) 2 g in sodium chloride 0.9 % 100 mL IVPB  Status:  Discontinued        2 g 200 mL/hr  over 30 Minutes Intravenous Every 24 hours 07/05/21 1054 07/07/21 0915   07/04/21 1245  cefTRIAXone (ROCEPHIN) 2 g in sodium chloride 0.9 % 100 mL IVPB  Status:  Discontinued        2 g 200 mL/hr over 30 Minutes Intravenous Every 24 hours 07/04/21 1145 07/05/21 0759   07/04/21 1245  metroNIDAZOLE (FLAGYL) IVPB 500 mg  Status:  Discontinued        500 mg 100 mL/hr over 60 Minutes Intravenous Every 8 hours 07/04/21 1145 07/05/21 0759          Subjective: No new complaints  Objective: Vitals:   07/09/21 2358 07/10/21 0440 07/10/21 0821 07/10/21  1625  BP: (!) 152/68 (!) 171/63 (!) 141/67   Pulse: 74 68 75 74  Resp: 20 20 (!) 22 20  Temp: (!) 97.4 F (36.3 C) 98.7 F (37.1 C) 98.5 F (36.9 C)   TempSrc: Oral Oral Oral   SpO2:  96% 97%   Weight:      Height:        Intake/Output Summary (Last 24 hours) at 07/10/2021 1826 Last data filed at 07/09/2021 2000 Gross per 24 hour  Intake --  Output 50 ml  Net -50 ml   Filed Weights   07/01/21 2245 07/02/21 0238  Weight: 96.2 kg 95.2 kg    Examination:  General: No acute distress. Cardiovascular: Heart sounds show Shataya Winkles regular rate, and rhythm. Lungs: Clear to auscultation bilaterally  Abdomen: Soft, nontender, nondistended - perc tube in place Neurological: Alert and oriented 3. Moves all extremities 4 . Cranial nerves II through XII grossly intact. Skin: Warm and dry. No rashes or lesions. Extremities: No clubbing or cyanosis. No edema.    Data Reviewed: I have personally reviewed following labs and imaging studies  CBC: Recent Labs  Lab 07/04/21 1028 07/05/21 0633 07/06/21 0050 07/07/21 0650 07/08/21 0114 07/09/21 0233 07/10/21 0248  WBC 18.7*   < > 22.1* 18.2* 16.2* 12.7* 9.9  NEUTROABS 16.0*  --   --   --   --  9.9* 6.4  HGB 12.2   < > 11.4* 11.0* 10.3* 9.8* 9.8*  HCT 37.3   < > 35.8* 34.0* 32.1* 31.7* 30.9*  MCV 93.5   < > 95.0 96.3 96.4 98.1 96.3  PLT 153   < > 149* 159 174 181 184   < > = values in this interval not displayed.    Basic Metabolic Panel: Recent Labs  Lab 07/06/21 0050 07/07/21 0650 07/08/21 0114 07/09/21 0233 07/10/21 0248  NA 136 140 142 143 141  K 3.9 3.8 3.7 3.7 3.4*  CL 106 111 112* 115* 113*  CO2 24 24 24 25 23   GLUCOSE 131* 113* 115* 123* 118*  BUN 25* 18 13 9 8   CREATININE 1.14* 0.93 0.89 0.83 0.68  CALCIUM 8.3* 7.9* 7.8* 8.0* 7.8*  MG 2.0 2.1 2.0 1.8 1.7  PHOS  --   --   --  2.0* 2.0*    GFR: Estimated Creatinine Clearance: 65.1 mL/min (by C-G formula based on SCr of 0.68 mg/dL).  Liver Function  Tests: Recent Labs  Lab 07/06/21 0050 07/07/21 0650 07/08/21 0114 07/09/21 0233 07/10/21 0248  AST 13* 12* 12* 18 14*  ALT 12 9 7 8 7   ALKPHOS 55 50 56 52 49  BILITOT 1.0 0.8 0.5 0.3 0.5  PROT 6.1* 6.1* 5.7* 5.9* 5.6*  ALBUMIN 2.1* 1.9* 1.6* 1.6* 1.7*    CBG: Recent Labs  Lab 07/09/21 1620 07/09/21  2100 07/10/21 0819 07/10/21 1139 07/10/21 1627  GLUCAP 108* 130* 128* 185* 113*     Recent Results (from the past 240 hour(s))  Resp Panel by RT-PCR (Flu Valyncia Wiens&B, Covid) Nasopharyngeal Swab     Status: None   Collection Time: 07/02/21  1:45 AM   Specimen: Nasopharyngeal Swab; Nasopharyngeal(NP) swabs in vial transport medium  Result Value Ref Range Status   SARS Coronavirus 2 by RT PCR NEGATIVE NEGATIVE Final    Comment: (NOTE) SARS-CoV-2 target nucleic acids are NOT DETECTED.  The SARS-CoV-2 RNA is generally detectable in upper respiratory specimens during the acute phase of infection. The lowest concentration of SARS-CoV-2 viral copies this assay can detect is 138 copies/mL. Nysir Fergusson negative result does not preclude SARS-Cov-2 infection and should not be used as the sole basis for treatment or other patient management decisions. Enzo Treu negative result may occur with  improper specimen collection/handling, submission of specimen other than nasopharyngeal swab, presence of viral mutation(s) within the areas targeted by this assay, and inadequate number of viral copies(<138 copies/mL). Kendrell Lottman negative result must be combined with clinical observations, patient history, and epidemiological information. The expected result is Negative.  Fact Sheet for Patients:  EntrepreneurPulse.com.au  Fact Sheet for Healthcare Providers:  IncredibleEmployment.be  This test is no t yet approved or cleared by the [place] FDA and  has been authorized for detection and/or diagnosis of SARS-CoV-2 by FDA under an Emergency Use Authorization (EUA). This EUA will  remain  in effect (meaning this test can be used) for the duration of the COVID-19 declaration under Section 564(b)(1) of the Act, 21 U.S.C.section 360bbb-3(b)(1), unless the authorization is terminated  or revoked sooner.       Influenza Kaylani Fromme by PCR NEGATIVE NEGATIVE Final   Influenza B by PCR NEGATIVE NEGATIVE Final    Comment: (NOTE) The Xpert Xpress SARS-CoV-2/FLU/RSV plus assay is intended as an aid in the diagnosis of influenza from Nasopharyngeal swab specimens and should not be used as Izaiha Lo sole basis for treatment. Nasal washings and aspirates are unacceptable for Xpert Xpress SARS-CoV-2/FLU/RSV testing.  Fact Sheet for Patients: EntrepreneurPulse.com.au  Fact Sheet for Healthcare Providers: IncredibleEmployment.be  This test is not yet approved or cleared by the [place] FDA and has been authorized for detection and/or diagnosis of SARS-CoV-2 by FDA under an Emergency Use Authorization (EUA). This EUA will remain in effect (meaning this test can be used) for the duration of the COVID-19 declaration under Section 564(b)(1) of the Act, 21 U.S.C. section 360bbb-3(b)(1), unless the authorization is terminated or revoked.  Performed at [facility], [address] [place], [place] [address]   Culture, blood (routine x 2)     Status: None   Collection Time: 07/04/21 10:28 AM   Specimen: BLOOD RIGHT HAND  Result Value Ref Range Status   Specimen Description BLOOD RIGHT HAND  Final   Special Requests   Final    BOTTLES DRAWN AEROBIC ONLY Blood Culture results may not be optimal due to an inadequate volume of blood received in culture bottles   Culture   Final    NO GROWTH 5 DAYS Performed at [facility], [place] [address] [place], [place] [address]    Report Status 07/09/2021 FINAL  Final  Culture, blood (routine x 2)     Status: Abnormal   Collection Time: 07/04/21 10:28 AM   Specimen: BLOOD LEFT HAND  Result Value Ref Range  Status   Specimen Description BLOOD LEFT HAND  Final   Special  Requests   Final    BOTTLES DRAWN AEROBIC ONLY Blood Culture adequate volume   Culture  Setup Time   Final    GRAM NEGATIVE RODS AEROBIC BOTTLE ONLY CRITICAL RESULT CALLED TO, READ BACK BY AND VERIFIED WITH: [place]. AT 0086 ON 7 11 2022 Performed at [facility], [place] [address] [place], [place] [address]    Culture ESCHERICHIA COLI (Mairin Lindsley)  Final   Report Status 07/07/2021 FINAL  Final   Organism ID, Bacteria ESCHERICHIA COLI  Final      Susceptibility   Escherichia coli - MIC*    AMPICILLIN 8 SENSITIVE Sensitive     CEFAZOLIN <=4 SENSITIVE Sensitive     CEFEPIME <=0.12 SENSITIVE Sensitive     CEFTAZIDIME <=1 SENSITIVE Sensitive     CEFTRIAXONE <=0.25 SENSITIVE Sensitive     CIPROFLOXACIN <=0.25 SENSITIVE Sensitive     GENTAMICIN <=1 SENSITIVE Sensitive     IMIPENEM <=0.25 SENSITIVE Sensitive     TRIMETH/SULFA <=20 SENSITIVE Sensitive     AMPICILLIN/SULBACTAM 4 SENSITIVE Sensitive     PIP/TAZO <=4 SENSITIVE Sensitive     * ESCHERICHIA COLI  Blood Culture ID Panel (Reflexed)     Status: Abnormal   Collection Time: 07/04/21 10:28 AM  Result Value Ref Range Status   Enterococcus faecalis NOT DETECTED NOT DETECTED Final   Enterococcus Faecium NOT DETECTED NOT DETECTED Final   Listeria monocytogenes NOT DETECTED NOT DETECTED Final   Staphylococcus species NOT DETECTED NOT DETECTED Final   Staphylococcus aureus (BCID) NOT DETECTED NOT DETECTED Final   Staphylococcus epidermidis NOT DETECTED NOT DETECTED Final   Staphylococcus lugdunensis NOT DETECTED NOT DETECTED Final   Streptococcus species NOT DETECTED NOT DETECTED Final   Streptococcus agalactiae NOT DETECTED NOT DETECTED Final   Streptococcus pneumoniae NOT DETECTED NOT DETECTED Final   Streptococcus pyogenes NOT DETECTED NOT DETECTED Final   Violette Morneault.calcoaceticus-baumannii NOT DETECTED NOT DETECTED Final   Bacteroides fragilis NOT DETECTED  NOT DETECTED Final   Enterobacterales DETECTED (Ples Trudel) NOT DETECTED Final    Comment: Enterobacterales represent Claude Waldman large order of gram negative bacteria, not Lilas Diefendorf single organism. CRITICAL RESULT CALLED TO, READ BACK BY AND VERIFIED WITH: PHARMD MIRANDA BRYK BY MESSAN H. AT 0932 ON 7 11 2022    Enterobacter cloacae complex NOT DETECTED NOT DETECTED Final   Escherichia coli DETECTED ([place]) NOT DETECTED Final    Comment: CRITICAL RESULT CALLED TO, READ BACK BY AND VERIFIED WITH: PHARMD MIRANDA BRYK BY MESSAN H. AT 0307 ON 7 11 2022    Klebsiella aerogenes NOT DETECTED NOT DETECTED Final   Klebsiella oxytoca NOT DETECTED NOT DETECTED Final   Klebsiella pneumoniae NOT DETECTED NOT DETECTED Final   Proteus species NOT DETECTED NOT DETECTED Final   Salmonella species NOT DETECTED NOT DETECTED Final   Serratia marcescens NOT DETECTED NOT DETECTED Final   Haemophilus influenzae NOT DETECTED NOT DETECTED Final   Neisseria meningitidis NOT DETECTED NOT DETECTED Final   Pseudomonas aeruginosa NOT DETECTED NOT DETECTED Final   Stenotrophomonas maltophilia NOT DETECTED NOT DETECTED Final   Candida albicans NOT DETECTED NOT DETECTED Final   Candida auris NOT DETECTED NOT DETECTED Final   Candida glabrata NOT DETECTED NOT DETECTED Final   Candida krusei NOT DETECTED NOT DETECTED Final   Candida parapsilosis NOT DETECTED NOT DETECTED Final   Candida tropicalis NOT DETECTED NOT DETECTED Final   Cryptococcus neoformans/gattii NOT DETECTED NOT DETECTED Final   CTX-M ESBL NOT DETECTED NOT DETECTED Final  Carbapenem resistance IMP NOT DETECTED NOT DETECTED Final   Carbapenem resistance KPC NOT DETECTED NOT DETECTED Final   Carbapenem resistance NDM NOT DETECTED NOT DETECTED Final   Carbapenem resist OXA 48 LIKE NOT DETECTED NOT DETECTED Final   Carbapenem resistance VIM NOT DETECTED NOT DETECTED Final    Comment: Performed at [facility], [place] [address] [place], [place] [address]  Culture, Urine      Status: None   Collection Time: 07/05/21 12:20 PM   Specimen: Urine, Catheterized  Result Value Ref Range Status   Specimen Description URINE, CATHETERIZED  Final   Special Requests NONE  Final   Culture   Final    NO GROWTH Performed at [facility], [address] [place], [place] [address]    Report Status 07/06/2021 FINAL  Final  Culture, blood (routine x 2)     Status: None (Preliminary result)   Collection Time: 07/06/21  9:24 AM   Specimen: BLOOD  Result Value Ref Range Status   Specimen Description BLOOD LEFT ANTECUBITAL  Final   Special Requests   Final    BOTTLES DRAWN AEROBIC AND ANAEROBIC Blood Culture results may not be optimal due to an excessive volume of blood received in culture bottles   Culture   Final    NO GROWTH 4 DAYS Performed at [facility], [place] [address], [place], [place] [address]    Report Status PENDING  Incomplete  Culture, blood (routine x 2)     Status: None (Preliminary result)   Collection Time: 07/06/21  9:30 AM   Specimen: BLOOD RIGHT FOREARM  Result Value Ref Range Status   Specimen Description BLOOD RIGHT FOREARM  Final   Special Requests   Final    BOTTLES DRAWN AEROBIC AND ANAEROBIC Blood Culture adequate volume   Culture   Final    NO GROWTH 4 DAYS Performed at [facility], [place] [address], [place], [place] [address]    Report Status PENDING  Incomplete         Radiology Studies: No results found.      Scheduled Meds:  atorvastatin  40 mg Oral Daily   cefdinir  300 mg Oral Q12H   enoxaparin (LOVENOX) injection  90 mg Subcutaneous Q12H   hydrALAZINE  50 mg Oral BID   insulin aspart  0-15 Units Subcutaneous TID WC   insulin glargine  20 Units Subcutaneous QHS   losartan  100 mg Oral Daily   metoprolol succinate  25 mg Oral Daily   metroNIDAZOLE  500 mg Oral Q12H   sodium chloride flush  3 mL Intravenous Q12H   sodium chloride flush  5 mL Intracatheter Q8H   Continuous Infusions:  sodium chloride      sodium chloride 75 mL/hr at 07/10/21 0104     LOS: 8 days    Time spent: over 30 min    Fayrene Helper, MD [facility]   To contact the attending provider between 7A-7P or the covering provider during after hours 7P-7A, please log into the web site [email or web address] and access using universal [place] password for that web site. If you do not have the password, please call the hospital operator.  07/10/2021, 6:26 PM

## 2021-07-10 NOTE — Progress Notes (Signed)
ANTICOAGULATION CONSULT NOTE - Initial Consult  Pharmacy Consult for enoxaparin>>apixaban Indication:  hx PE  Allergies  Allergen Reactions   Penicillins Hives, Itching and Other (See Comments)    Has tolerated ceftriaxone 7/22 Has patient had a PCN reaction causing immediate rash, facial/tongue/throat swelling, SOB or lightheadedness with hypotension: No Has patient had a PCN reaction causing severe rash involving mucus membranes or skin necrosis: Yes Has patient had a PCN reaction that required hospitalization No Has patient had a PCN reaction occurring within the last 10 years: No If all of the above answers are "NO", then may proceed with Cephalosporin use.     Patient Measurements: Height: 5\' 5"  (165.1 cm) Weight: 95.2 kg (209 lb 14.1 oz) IBW/kg (Calculated) : 57  Vital Signs: Temp: 98.5 F (36.9 C) (07/16 0821) Temp Source: Oral (07/16 0821) BP: 141/67 (07/16 0821) Pulse Rate: 74 (07/16 1625)  Labs: Recent Labs    07/08/21 0114 07/09/21 0233 07/10/21 0248  HGB 10.3* 9.8* 9.8*  HCT 32.1* 31.7* 30.9*  PLT 174 181 184  CREATININE 0.89 0.83 0.68    Estimated Creatinine Clearance: 65.1 mL/min (by C-G formula based on SCr of 0.68 mg/dL).   Medical History: Past Medical History:  Diagnosis Date   Acid reflux    Arthritis    Colon adenomas    AGE 80   Complete heart block (HCC)    STJ PPM Dr. 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, long term anticoag with apixaban    Hyperlipidemia    Myoview 03/2021    Myoview 4/22: EF 50, no infarct or ischemia; low risk   Presence of permanent cardiac pacemaker    Type 2 diabetes mellitus (HCC)     Medications:  Scheduled:   apixaban  5 mg Oral BID   atorvastatin  40 mg Oral Daily   cefdinir  300 mg Oral Q12H   hydrALAZINE  50 mg Oral BID   insulin aspart  0-15 Units Subcutaneous TID WC   insulin glargine  20 Units Subcutaneous QHS   losartan  100 mg Oral Daily    metoprolol succinate  25 mg Oral Daily   metroNIDAZOLE  500 mg Oral Q12H   sodium chloride flush  3 mL Intravenous Q12H   sodium chloride flush  5 mL Intracatheter Q8H    Assessment: 2 yom presenting CP with SOB found to have nonobstructive CAD. Now s/p perc cole tube by IR 7/14. On apixaban PTA for hx DVT.  Was on enoxaparin while apixaban was on hold (last dose this morning on 7/16@1004 ). Will restart apixaban 12 hours from last dose.   Goal of Therapy:  Monitor platelets by anticoagulation protocol: Yes   Plan:  Start apixaban 5 mg BID on 7/16@2200  Monitor CBC and for s/sx of bleeding.   Antonietta Jewel, PharmD, BCCCP Clinical Pharmacist  Phone: [phone] 07/10/2021 7:45 PM  Please check AMION for all [place] phone numbers After 10:00 PM, call [place] [phone]

## 2021-07-10 NOTE — Evaluation (Signed)
Physical Therapy Evaluation Patient Details Name: Natalie Castro MRN: [id number] DOB: 11/05/1941 Today's Date: 07/10/2021   History of Present Illness  80 y.o. female with past medical history of HLD, arthritis, GERD, PE in 2017, CHB s/p St. Jude pacemaker placement in 2016, recently admitted with chest pain and underwent LHC 07/02/21 with evidence of mild CAD and no intervention was required.  She continued to have chest pain, abdominal pain, diarrhea.  Further work-up revealed acute cholecystitis.   Clinical Impression  Pt admitted with above diagnosis. PTA pt lived alone, mod I mobility using rollator. On eval, pt required min assist bed mobility, min assist transfers, and min assist ambulation 47' with RW. Deficits noted in strength and balance. Pt fatigues quickly. VSS on RA. Pt currently with functional limitations due to the deficits listed below (see PT Problem List). Pt will benefit from skilled PT to increase their independence and safety with mobility to allow discharge to the venue listed below. Pt plans to d/c to her daughter's house. Daughter present during eval and confirms family able to provide needed level of assist at home.      Follow Up Recommendations Home health PT;Supervision/Assistance - 24 hour    Equipment Recommendations  3in1 (PT)    Recommendations for Other Services       Precautions / Restrictions Precautions Precautions: Fall      Mobility  Bed Mobility Overal bed mobility: Needs Assistance Bed Mobility: Supine to Sit;Sit to Supine     Supine to sit: Min assist;HOB elevated Sit to supine: Min assist   General bed mobility comments: increased time, cues for sequencing    Transfers Overall transfer level: Needs assistance Equipment used: Rolling walker (2 wheeled) Transfers: Sit to/from Stand Sit to Stand: Min assist         General transfer comment: assist to power up, increased time  Ambulation/Gait Ambulation/Gait assistance: Min  assist Gait Distance (Feet): 55 Feet Assistive device: Rolling walker (2 wheeled) Gait Pattern/deviations: Step-through pattern;Decreased stride length Gait velocity: decreased Gait velocity interpretation: <1.31 ft/sec, indicative of household ambulator General Gait Details: min assist to maintain balance. Fatigues quickly. SpO2 95% on RA. 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 (Stroke Patients Only)       Balance Overall balance assessment: Needs assistance Sitting-balance support: No upper extremity supported;Feet supported Sitting balance-Leahy Scale: Good     Standing balance support: Bilateral upper extremity supported;During functional activity Standing balance-Leahy Scale: Poor Standing balance comment: reliant on external support                             Pertinent Vitals/Pain Pain Assessment: Faces Faces Pain Scale: Hurts little more Pain Location: R abdomen Pain Descriptors / Indicators: Grimacing;Discomfort;Sore Pain Intervention(s): Monitored during session;Repositioned    Home Living Family/patient expects to be discharged to:: Private residence Living Arrangements: Alone Available Help at Discharge: Family;Available 24 hours/day Type of Home: House Home Access: Stairs to enter   [employer] of Steps: 1 Home Layout: One level Home Equipment: Cane - single point;Walker - 4 wheels;Shower seat;Walker - 2 wheels Additional Comments: Above home set up is for pt's daughter's house, which is where she will discharge.    Prior Function Level of Independence: Independent with assistive device(s)         Comments: Amb with rollator. Mod I ADLs. Family assists with iADLs.  Hand Dominance        Extremity/Trunk Assessment   Upper Extremity Assessment Upper Extremity Assessment: Generalized weakness    Lower Extremity Assessment Lower Extremity Assessment: Generalized weakness        Communication   Communication: No difficulties  Cognition Arousal/Alertness: Awake/alert Behavior During Therapy: WFL for tasks assessed/performed Overall Cognitive Status: Within Functional Limits for tasks assessed                                        General Comments      Exercises     Assessment/Plan    PT Assessment Patient needs continued PT services  PT Problem List Decreased strength;Decreased mobility;Decreased activity tolerance;Decreased balance;Pain       PT Treatment Interventions Therapeutic activities;Gait training;Therapeutic exercise;Patient/family education;Balance training;Stair training;Functional mobility training    PT Goals (Current goals can be found in the Care Plan section)  Acute Rehab PT Goals Patient Stated Goal: home PT Goal Formulation: With patient/family Time For Goal Achievement: 07/24/21 Potential to Achieve Goals: Good    Frequency Min 3X/week   Barriers to discharge        Co-evaluation               AM-PAC PT "6 Clicks" Mobility  Outcome Measure Help needed turning from your back to your side while in a flat bed without using bedrails?: A Little Help needed moving from lying on your back to sitting on the side of a flat bed without using bedrails?: A Little Help needed moving to and from a bed to a chair (including a wheelchair)?: A Little Help needed standing up from a chair using your arms (e.g., wheelchair or bedside chair)?: A Little Help needed to walk in hospital room?: A Little Help needed climbing 3-5 steps with a railing? : A Lot 6 Click Score: 17    End of Session Equipment Utilized During Treatment: Gait belt Activity Tolerance: Patient limited by fatigue Patient left: in bed;with call bell/phone within reach;with family/visitor present Nurse Communication: Mobility status PT Visit Diagnosis: Muscle weakness (generalized) (M62.81);Difficulty in walking, not elsewhere classified  (R26.2);Pain Pain - Right/Left: Right    Time: 7341-9379 PT Time Calculation (min) (ACUTE ONLY): 20 min   Charges:   PT Evaluation $PT Eval Moderate Complexity: 1 Mod          Lorrin Goodell, PT  Office # [phone] Pager [phone]   Lorriane Shire 07/10/2021, 1:15 PM

## 2021-07-11 DIAGNOSIS — A4151 Sepsis due to Escherichia coli [E. coli]: Secondary | ICD-10-CM | POA: Diagnosis not present

## 2021-07-11 LAB — CBC WITH DIFFERENTIAL/PLATELET
Abs Immature Granulocytes: 0.3 10*3/uL — ABNORMAL HIGH (ref 0.00–0.07)
Basophils Absolute: 0 10*3/uL (ref 0.0–0.1)
Basophils Relative: 0 %
Eosinophils Absolute: 0.2 10*3/uL (ref 0.0–0.5)
Eosinophils Relative: 3 %
HCT: 30.2 % — ABNORMAL LOW (ref 36.0–46.0)
Hemoglobin: 9.7 g/dL — ABNORMAL LOW (ref 12.0–15.0)
Immature Granulocytes: 4 %
Lymphocytes Relative: 23 %
Lymphs Abs: 1.9 10*3/uL (ref 0.7–4.0)
MCH: 30.8 pg (ref 26.0–34.0)
MCHC: 32.1 g/dL (ref 30.0–36.0)
MCV: 95.9 fL (ref 80.0–100.0)
Monocytes Absolute: 0.9 10*3/uL (ref 0.1–1.0)
Monocytes Relative: 10 %
Neutro Abs: 5.2 10*3/uL (ref 1.7–7.7)
Neutrophils Relative %: 60 %
Platelets: 231 10*3/uL (ref 150–400)
RBC: 3.15 MIL/uL — ABNORMAL LOW (ref 3.87–5.11)
RDW: 15.2 % (ref 11.5–15.5)
WBC: 8.5 10*3/uL (ref 4.0–10.5)
nRBC: 0.4 % — ABNORMAL HIGH (ref 0.0–0.2)

## 2021-07-11 LAB — CULTURE, BLOOD (ROUTINE X 2)
Culture: NO GROWTH
Culture: NO GROWTH
Special Requests: ADEQUATE

## 2021-07-11 LAB — COMPREHENSIVE METABOLIC PANEL
ALT: 7 U/L (ref 0–44)
AST: 12 U/L — ABNORMAL LOW (ref 15–41)
Albumin: 1.6 g/dL — ABNORMAL LOW (ref 3.5–5.0)
Alkaline Phosphatase: 44 U/L (ref 38–126)
Anion gap: 4 — ABNORMAL LOW (ref 5–15)
BUN: 5 mg/dL — ABNORMAL LOW (ref 8–23)
CO2: 24 mmol/L (ref 22–32)
Calcium: 7.7 mg/dL — ABNORMAL LOW (ref 8.9–10.3)
Chloride: 110 mmol/L (ref 98–111)
Creatinine, Ser: 0.69 mg/dL (ref 0.44–1.00)
GFR, Estimated: >60 mL/min (ref >60)
Glucose, Bld: 139 mg/dL — ABNORMAL HIGH (ref 70–99)
Potassium: 3.4 mmol/L — ABNORMAL LOW (ref 3.5–5.1)
Sodium: 138 mmol/L (ref 135–145)
Total Bilirubin: 0.3 mg/dL (ref 0.3–1.2)
Total Protein: 5.5 g/dL — ABNORMAL LOW (ref 6.5–8.1)

## 2021-07-11 LAB — GLUCOSE, CAPILLARY
Glucose-Capillary: 122 mg/dL — ABNORMAL HIGH (ref 70–99)
Glucose-Capillary: 131 mg/dL — ABNORMAL HIGH (ref 70–99)

## 2021-07-11 LAB — PHOSPHORUS: Phosphorus: 2.3 mg/dL — ABNORMAL LOW (ref 2.5–4.6)

## 2021-07-11 LAB — MAGNESIUM: Magnesium: 1.6 mg/dL — ABNORMAL LOW (ref 1.7–2.4)

## 2021-07-11 MED ORDER — METRONIDAZOLE 500 MG PO TABS: 500.0000 mg | ORAL_TABLET | Freq: Two times a day (BID) | ORAL | 0 refills | Status: AC

## 2021-07-11 MED ORDER — CEFDINIR 300 MG PO CAPS: 300.0000 mg | ORAL_CAPSULE | Freq: Two times a day (BID) | ORAL | 0 refills | Status: AC

## 2021-07-11 MED ORDER — POTASSIUM CHLORIDE CRYS ER 20 MEQ PO TBCR
40.0000 meq | EXTENDED_RELEASE_TABLET | Freq: Once | ORAL | Status: AC
  Administered 2021-07-11: 40 meq via ORAL
  Filled 2021-07-11: qty 2

## 2021-07-11 MED ORDER — MAGNESIUM OXIDE 400 MG PO CAPS: 400.0000 mg | ORAL_CAPSULE | Freq: Every day | ORAL | 0 refills | Status: AC

## 2021-07-11 MED ORDER — MAGNESIUM OXIDE -MG SUPPLEMENT 400 (240 MG) MG PO TABS
400.0000 mg | ORAL_TABLET | Freq: Two times a day (BID) | ORAL | Status: DC
  Administered 2021-07-11: 400 mg via ORAL
  Filled 2021-07-11: qty 1

## 2021-07-11 MED ORDER — HYDRALAZINE HCL 50 MG PO TABS: 50.0000 mg | ORAL_TABLET | Freq: Two times a day (BID) | ORAL | 0 refills | Status: DC

## 2021-07-11 NOTE — Progress Notes (Cosign Needed)
   Durable Medical Equipment (From admission, onward)        Start     Ordered  07/11/21 1126  For home use only DME Hospital bed  Once      Question Answer Comment Length of Need 12 Months  The above medical condition requires: Patient requires the ability to reposition frequently  Bed type Semi-electric  Support Surface: Gel Overlay    07/11/21 1126  07/10/21 1903  For home use only DME 3 n 1  Once       07/10/21 1903

## 2021-07-11 NOTE — Discharge Summary (Signed)
Physician Discharge Summary  JENAVEE LAGUARDIA CXK:[id number] DOB: [birth year] DOA: 07/01/2021  PCP: [name], MD  Admit date: 07/01/2021 Discharge date: 07/11/2021  Time spent: 40 minutes  Recommendations for Outpatient Follow-up:  Follow outpatient CBC/CMP Complete antibiotics outpatient Follow with IR outpatient to follow perc chole tube Follow with general surgery outpatient for consideration of cholecystectomy Follow with EP to follow reprogramming of pacemaker Consider MRCP outpatient for dilated pancreatic duct Follow lytes outpatient   Discharge Diagnoses:  Principal Problem:   Septicemia due to E. coli ([place]) Active Problems:   Essential hypertension, benign   Saddle pulmonary embolus (HCC)   Elevated troponin I level   Uncontrolled type 2 diabetes mellitus with hyperglycemia ([place])   Acute coronary syndrome (HCC)   Protein-calorie malnutrition, mild (HCC)   Chest pain   Sepsis ([place])   Bacteremia   Acute kidney injury ([place])   Pancreatic duct dilated   Discharge Condition: stable  Diet recommendation: diabetic, heart healthy  Filed Weights   07/01/21 2245 07/02/21 0238  Weight: 96.2 kg 95.2 kg    History of present illness:  MADHAVI HAMBLEN is Deleon Passe 80 year old female with past medical history significant for 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  Echocardiogram with LVEF 55-60% with hypokinesis of the distal inferior/inferior septal walls.   On 07/04/2021,  patient was noted to have Devonda Pequignot fever up to 101.3 F with tachypnea.  TRH was consulted on 7/10 for further evaluation and work-up of fever with SIRS symptomatology.  She was found to have e. Coli bacteremia in the setting of acute cholecystitis.  Surgery was c/s.  IR placed perc chole tube.  Planning for outpatient follow up.    She's improving on the day of discharge.  Plan for d/c with home health.  Hospital Course:  E. coli Bacteremia  Acute Cholecystitis On 07/04/2021, patient developed fever up to 101.3 F with associated tachypnea.  Patient with reported history of nausea/vomiting on presentation with associated chest pain.  Queried possibility of aspiration pneumonia.  Chest x-ray with no acute cardiopulmonary disease process.  Lipase 23, within normal limits.  Review of CT angiogram chest/abdomen/pelvis performed on 07/02/2021 at time of admission noted mild gallbladder distention and dilation of the pancreatic duct.  Abdominal x-ray 7/10 with nonobstructive bowel gas pattern with recommendation of right upper quadrant ultrasound to exclude acute cholecystitis.  Right upper quadrant ultrasound 7/10 with gallbladder distention without gallstones or wall thickening and no sonographic Murphy sign noted by sonographer with CBD 4 mm.  Blood cultures x2 were ordered with 1 returning positive (anaerobic bottle) for E. coli.  LFTs and total bilirubin within normal limits.  Urinalysis and urine culture unrevealing, although obtained after initiation of IV antibiotics.  Seen by gastroenterology on 07/05/2021, for dilated pancreatic duct and Dr. [name] does not believe this is related to her E. coli bacteremia. - HIDA scan done 7/13 and positive for cholecystitis - s/p perc chole tube by IR 7/14 - surgery recommending follow up with IR and with Dr. [name] in ~6 weeks to discuss possible interval cholecystectomy --ID following, appreciate assistance -  recommending 2 weeks total treatment with cefdinir and flagyl  (end date 07/15/21) --fever 7/14 AM, follow with continued abx --diet advanced per surgery --resume eliquis --blood cx 7/12 NGTD --blood cx 7/10 E. Coli in 1/2 cultures   Atypical chest pain Nonobstructive CAD Patient initially presenting to [facility] with chest pain, transferred to [facility] left heart catheterization with minimal nonobstructive CAD.  TTE with normal LVEF, but hypokinesis of distal inferior/inferoseptal walls. --Atorvastatin 40 mg p.o. daily --Metoprolol succinate 25 mg p.o. daily --Home aspirin discontinued by cardiology (no strong indication per cards), will resume eliquis   Essential hypertension BP appropriate today.  Home regimen includes metoprolol succinate 25 mg p.o. daily, nifedipine 30 mg p.o. daily, losartan 100 mg p.o. daily, hydralazine 50 mg p.o. twice daily. --Metoprolol succinate 25 mg p.o. daily, losartan 100 mg daily, hydralazine 50 mg BID --hold remaining antihypertensives including nifedipine --Continue monitor BP closely   Type 2 diabetes mellitus Home regimen includes metformin 1000 mg p.o. twice daily, Lantus 30 units subcutaneously nightly.  Hemoglobin A1c 7.4 07/02/2021, well controlled. --resume home med   Hx complete heart block s/p [facility] Jude PPM Follows with electrophysiology outpatient.  Device interrogated with prolonged AV timing, EP, Dr. [name] recommended reprogramming as outpatient.   Hx of PE 2017, on chronic anticoagulation On Eliquis outpatient.  D/c lovenox, resume eliquis in prepartation for dc/   Hypomagnesemia: resolved   Procedures: Perc chole tube   Cath The left ventricular systolic function is normal. LV end diastolic pressure is mildly elevated. LVEDP 17 mm Hg. The left ventricular ejection fraction is 55-65% by visual estimate. There is no aortic valve stenosis. Mild nonobstructive coroanry artery disease. Tortuous vessels. Catheter induced spasm of the ostial RCA.   Mild radial  vasospasm.  Continue preventive therapy.  Low level troponin elevation may be related to her rhythm disturbance noted on ECG.     IMPRESSIONS     1. Hypokinesis of the distal inferior/inferoseptal walls. Changes appear  more prominent than in previous echo in 2017. Marland Kitchen Left ventricular ejection  fraction, by estimation, is 55 to 60%. The left ventricle has normal  function. There is moderate left  ventricular hypertrophy. Elevated left atrial pressure.   2. Right ventricular systolic function is normal. The right ventricular  size is normal. There is normal pulmonary artery systolic pressure.   3. The mitral valve is normal in structure. Mild to moderate mitral valve  regurgitation.   4. The aortic valve is tricuspid. Aortic valve regurgitation is not  visualized. Mild to moderate aortic valve sclerosis/calcification is  present, without any evidence of aortic stenosis.   Consultations: Cardiology IR General surgery ID  Discharge Exam: Vitals:   07/11/21 0607 07/11/21 0620  BP: (!) 144/73 (!) 144/73  Pulse: 74   Resp: (!) 24   Temp: 98.5 F (36.9 C) 98.6 F (37 C)  SpO2: 97%    Eager to go home No new complaints Discussed plan of care with daughter over phone  General: No acute distress. Cardiovascular: RRR Lungs: unlabored Abdomen: Soft, nontender, nondistended - perc chole tube Neurological: Alert and oriented 3. Moves all extremities 4  Cranial nerves II through XII grossly intact. Skin: Warm and dry. No rashes or lesions. Extremities: No clubbing or cyanosis. No edema.   Discharge Instructions   Discharge Instructions     Call MD for:  difficulty breathing, headache or visual disturbances   Complete by: As directed    Call MD for:  difficulty breathing, headache or visual disturbances   Complete by: As directed    Call MD for:  difficulty breathing, headache or visual disturbances   Complete by: As directed    Call MD for:  extreme fatigue   Complete by:  As directed    Call MD for:  extreme fatigue   Complete by: As directed    Call MD for:  extreme fatigue   Complete by: As directed    Call MD for:  hives   Complete by: As directed    Call MD for:  hives   Complete by: As directed    Call MD for:  hives   Complete by: As directed    Call MD for:  persistant dizziness or light-headedness   Complete by: As directed    Call MD for:  persistant dizziness or light-headedness   Complete by: As directed    Call MD for:  persistant dizziness or light-headedness   Complete by: As directed    Call MD for:  persistant nausea and vomiting   Complete by: As directed    Call MD for:  persistant nausea and vomiting   Complete by: As directed    Call MD for:  persistant nausea and vomiting   Complete by: As directed    Call MD for:  redness, tenderness, or signs of infection (pain, swelling, redness, odor or green/yellow discharge around incision site)   Complete by: As directed    Call MD for:  redness, tenderness, or signs of infection (pain, swelling, redness, odor or green/yellow discharge around incision site)   Complete by: As directed    Call MD for:  redness, tenderness, or signs of infection (pain, swelling, redness, odor or green/yellow discharge around incision site)   Complete by: As directed    Call MD for:  severe uncontrolled pain   Complete by: As directed    Call MD for:  severe uncontrolled pain   Complete by: As directed    Call MD for:  severe uncontrolled pain   Complete by: As directed    Call MD for:  temperature >100.4   Complete by: As directed    Call MD for:  temperature >100.4   Complete by: As directed    Call MD for:  temperature >100.4   Complete by: As directed    Diet - low sodium heart healthy   Complete by: As directed    Diet - low sodium heart healthy   Complete by: As directed    Diet - low sodium heart healthy   Complete by: As directed    Discharge instructions   Complete by: As directed     No driving for 2-3 days. No lifting over 5 lbs for 1 week. No sexual activity for 1 week. Keep procedure site clean & dry. If you notice increased pain, swelling, bleeding or pus, call/return!  You may shower, but no soaking baths/hot tubs/pools for 1 week.   Discharge instructions   Complete by: As directed    No driving for 3-5 days. No lifting over 5 lbs for 1 week. No sexual activity for 1 week. Keep procedure site clean & dry. If you notice increased pain, swelling, bleeding or pus, call/return!  You may shower, but no soaking baths/hot tubs/pools for 1 week.   Discharge instructions   Complete by: As directed    You were seen for abdominal pain.  This was due to acute cholecystitis.  You developed bacteremia (e coli  bacteria in your blood) as Rosann Gorum result of this.  You were treated with antibiotics and source control was obtained with the percutaneous cholecystostomy tube.  You'll continue antibiotics for 5 more days.    You'll need to follow up with interventional radiology as an outpatient to follow up the cholecystostomy tube.  You'll need to follow up with general surgery as they'll consider whether you're appropriate for Lasheena Frieze cholecystectomy in the future.  You had Dayten Juba cardiac cath due to concern for chest pain when you were here.  Your procedure showed minimal coronary artery disease.  Continue your statin.  We'll stop your aspirin.  We stopped your nifedipine for blood pressure.  Continue metoprolol, losartan, and hydralazine (adjusted to twice Stepfanie Yott day).  Resume your lantus and metformin.  Watch your blood sugars as you return home.   You'll need reprogramming of your pacemaker as an outpatient with cardiology.  Follow with your PCP outpatient within Stokes Rattigan week or so.  Follow with IR and general surgery outpatient for your drain.  Discuss with your PCP whether to pursue an MRCP for your dilated pancreatic duct.   Resume your eliquis.   Return for new, recurrent, or worsening symptoms.  Please  ask your PCP to request records from this hospitalization so they know what was done and what the next steps will be.   Increase activity slowly   Complete by: As directed    Increase activity slowly   Complete by: As directed    Increase activity slowly   Complete by: As directed    No wound care   Complete by: As directed       Allergies as of 07/11/2021       Reactions   Penicillins Hives, Itching, Other (See Comments)   Has tolerated ceftriaxone 7/22 Has patient had Kosisochukwu Burningham PCN reaction causing immediate rash, facial/tongue/throat swelling, SOB or lightheadedness with hypotension: No Has patient had Alexarae Oliva PCN reaction causing severe rash involving mucus membranes or skin necrosis: Yes Has patient had Shadrach Bartunek PCN reaction that required hospitalization No Has patient had Lynnsie Linders PCN reaction occurring within the last 10 years: No If all of the above answers are "NO", then may proceed with Cephalosporin use.        Medication List     STOP taking these medications    NIFEdipine 30 MG 24 hr tablet Commonly known as: PROCARDIA-XL/NIFEDICAL-XL       TAKE these medications    cefdinir 300 MG capsule Commonly known as: OMNICEF Take 1 capsule (300 mg total) by mouth every 12 (twelve) hours for 5 days.   glucose blood test strip Use to check blood glucose fasting , before lunch and dinner and after your largest meal   hydrALAZINE 50 MG tablet Commonly known as: APRESOLINE Take 1 tablet (50 mg total) by mouth 2 (two) times daily. What changed: when to take this   Lantus SoloStar 100 UNIT/ML Solostar Pen Generic drug: insulin glargine Inject 30 Units into the skin at bedtime.   Magnesium Oxide 400 MG Caps Take 1 capsule (400 mg total) by mouth daily.   metoprolol succinate 25 MG 24 hr tablet Commonly known as: TOPROL-XL Take 1 tablet (25 mg total) by mouth daily.   metroNIDAZOLE 500 MG tablet Commonly known as: FLAGYL Take 1 tablet (500 mg total) by mouth every 12 (twelve) hours for  5 days.   nitroGLYCERIN 0.4 MG SL tablet Commonly known as: NITROSTAT Place 1 tablet (0.4 mg total) under the tongue every 5 (five)  minutes as needed for chest pain.   onetouch ultrasoft lancets Use to check glucose 4 x daily       ASK your doctor about these medications    atorvastatin 40 MG tablet Commonly known as: LIPITOR TAKE (1) TABLET BY MOUTH AT BEDTIME.   Eliquis 5 MG Tabs tablet Generic drug: apixaban TAKE 1 TABLET BY MOUTH TWICE Tosha Belgarde DAY.   losartan 100 MG tablet Commonly known as: COZAAR TAKE (1) TABLET BY MOUTH ONCE DAILY.   metFORMIN 1000 MG tablet Commonly known as: GLUCOPHAGE TAKE (1) TABLET BY MOUTH TWICE DAILY.               Durable Medical Equipment  (From admission, onward)           Start     Ordered   07/11/21 1021  For home use only DME Hospital bed  Once       Question Answer Comment  Length of Need 12 Months   The above medical condition requires: Patient requires the ability to reposition frequently   Bed type Semi-electric      07/11/21 1022   07/10/21 1903  For home use only DME 3 n 1  Once        07/10/21 1903           Allergies  Allergen Reactions   Penicillins Hives, Itching and Other (See Comments)    Has tolerated ceftriaxone 7/22 Has patient had Vanessa Kampf PCN reaction causing immediate rash, facial/tongue/throat swelling, SOB or lightheadedness with hypotension: No Has patient had Lynzie Cliburn PCN reaction causing severe rash involving mucus membranes or skin necrosis: Yes Has patient had Raiya Stainback PCN reaction that required hospitalization No Has patient had Elya Diloreto PCN reaction occurring within the last 10 years: No If all of the above answers are "NO", then may proceed with Cephalosporin use.     Follow-up Information     Evans Lance, MD. Call.   Specialty: Cardiology Why: The office will call with date and time of appointment Contact information: [address] [place] [address] [phone]          Rolm Bookbinder, MD Follow up.   Specialty: General Surgery Why: Please call to confirm your appointment time. We are working on arranging this for you. Contact information: [place] [address] [phone]         Gilford Silvius, MD. Call.   Specialty: Diagnostic Radiology Why: To schedule follow up of your percutaneous cholecystostomy tube Contact information: [address] [place] [address] [phone]                  The results of significant diagnostics from this hospitalization (including imaging, microbiology, ancillary and laboratory) are listed below for reference.    Significant Diagnostic Studies: DG Abd 1 View  Result Date: 07/04/2021 CLINICAL DATA:  Fever and abdominal pain. EXAM: ABDOMEN - 1 VIEW COMPARISON:  CT abdomen dated 07/02/2021. FINDINGS: Visualized bowel gas pattern is nonobstructive. No evidence of abnormal fluid collection. No evidence of renal or ureteral calculi. Visualized osseous structures are unremarkable. IMPRESSION: 1. Nonobstructive bowel gas pattern. 2. Recommend RIGHT upper quadrant ultrasound to exclude acute cholecystitis as Orra Nolde possible cause for patient's fever and abdominal pain. These results will be called to the ordering clinician or representative by the Radiologist Assistant, and communication documented in the PACS or [employer]. Electronically Signed   By: Franki Cabot M.D.   On:  07/04/2021 10:48   CARDIAC CATHETERIZATION  Addendum Date: 07/02/2021    The left ventricular systolic function is normal.  LV end diastolic pressure is mildly elevated. LVEDP 17 mm Hg.  The left ventricular ejection fraction is 55-65% by visual estimate.  There is no aortic valve stenosis.  Mild nonobstructive coroanry artery disease.  Tortuous vessels. Catheter induced spasm of the ostial RCA.  Mild radial vasospasm.  Continue preventive therapy.  Low level troponin elevation may be related to  her rhythm disturbance noted on ECG.    Addendum Date: 07/02/2021    The left ventricular systolic function is normal.  LV end diastolic pressure is mildly elevated. LVEDP 17 mm Hg.  The left ventricular ejection fraction is 55-65% by visual estimate.  There is no aortic valve stenosis.  Mild nonobstructive coroanry artery disease.  Tortuous vessels. Catheter induced spasm of the ostial RCA.  Mild radial vasospasm.  Continue preventive therapy.   Result Date: 07/02/2021  The left ventricular systolic function is normal.  LV end diastolic pressure is mildly elevated. LVEDP 17 mm Hg.  The left ventricular ejection fraction is 55-65% by visual estimate.  There is no aortic valve stenosis.  Mild nonobstructive coroanry artery disease.  Tortuous vessels. Catheter induced spasm of the ostial RCA.  Mild radial vasospasm.  Continue preventive therapy.   NM Hepato W/EF  Result Date: 07/07/2021 CLINICAL DATA:  Concern for acalculous cholecystitis EXAM: NUCLEAR MEDICINE HEPATOBILIARY IMAGING TECHNIQUE: Sequential images of the abdomen were obtained out to 60 minutes following intravenous administration of radiopharmaceutical. An additional 60 minutes of scintigraphic imaging was performed after the administration of 3 mg of morphine. RADIOPHARMACEUTICALS:  5.2 mCi Tc-42m  Choletec IV COMPARISON:  Ultrasound July 04, 2021. FINDINGS: Prompt uptake and biliary excretion of activity by the liver is seen. No gallbladder activity is visualized at 60 minutes or after following administration of morphine. Biliary activity passes into small bowel, consistent with patent common bile duct. IMPRESSION: Scintigraphic findings consistent with acute cholecystitis. Patent common bile duct. These results will be called to the ordering clinician or representative by the Radiologist Assistant, and communication documented in the PACS or [employer]. Electronically Signed   By: Dahlia Bailiff MD   On: 07/07/2021 15:23    IR Perc Cholecystostomy  Result Date: 07/08/2021 INDICATION: Acute cholecystitis. Poor operative candidate. Patient presents for image guided cholecystostomy tube placement for infection source control purposes. EXAM: ULTRASOUND AND FLUOROSCOPIC-GUIDED CHOLECYSTOSTOMY TUBE PLACEMENT COMPARISON:  Nuclear medicine HIDA scan-07/07/2021; right upper quadrant abdominal ultrasound-07/04/2021; CT abdomen and pelvis-07/02/2021 MEDICATIONS: The patient is currently admitted to the hospital and on intravenous antibiotics. Antibiotics were administered within an appropriate time frame prior to skin puncture. ANESTHESIA/SEDATION: Moderate (conscious) sedation was employed during this procedure. Kyomi Hector total of Versed 1 mg and Fentanyl 50 mcg was administered intravenously. Moderate Sedation Time: 12 minutes. The patient's level of consciousness and vital signs were monitored continuously by radiology nursing throughout the procedure under my direct supervision. CONTRAST:  73mL OMNIPAQUE IOHEXOL 300 MG/ML SOLN - administered into the gallbladder lumen. FLUOROSCOPY TIME:  48 seconds (13 mGy) COMPLICATIONS: None immediate. PROCEDURE: Informed written consent was obtained from the patient after Embrie Mikkelsen discussion of the risks, benefits and alternatives to treatment. Questions regarding the procedure were encouraged and answered. Janya Eveland timeout was performed prior to the initiation of the procedure. The right upper abdominal quadrant was prepped and draped in the usual sterile fashion, and Ruford Dudzinski sterile drape was applied covering the operative field. Maximum barrier sterile technique with  sterile gowns and gloves were used for the procedure. Zeke Aker timeout was performed prior to the initiation of the procedure. Local anesthesia was provided with 1% lidocaine with epinephrine. Ultrasound scanning of the right upper quadrant demonstrates Joshalyn Ancheta moderately dilated gallbladder with minimal amount of gallbladder wall thickening and pericholecystic fluid. Of  note, the patient reported pain with ultrasound imaging over the gallbladder. Utilizing Shari Natt transhepatic approach, Reshawn Ostlund 22 gauge needle was advanced into the gallbladder under direct ultrasound guidance. An ultrasound image was saved for documentation purposes. Appropriate intraluminal puncture was confirmed with the efflux of bile and advancement of an 0.018 wire into the gallbladder lumen. The needle was exchanged for an [place] set. Melvin Whiteford small amount of contrast was injected to confirm appropriate intraluminal positioning. Over Leeland Lovelady Benson wire, Aliha Diedrich 22.2-French Cook cholecystomy tube was advanced into the gallbladder fossa, coiled and locked. Bile was aspirated and Garielle Mroz small amount of contrast was injected as several post procedural spot radiographic images were obtained in various obliquities. The catheter was secured to the skin with suture, connected to Tamikia Chowning drainage bag and Neima Lacross dressing was placed. The patient tolerated the procedure well without immediate post procedural complication. IMPRESSION: Successful ultrasound and fluoroscopic guided placement of Alok Minshall 10.2 French cholecystostomy tube. Electronically Signed   By: Sandi Mariscal M.D.   On: 07/08/2021 14:02   DG CHEST PORT 1 VIEW  Result Date: 07/04/2021 CLINICAL DATA:  Acute coronary syndrome, fever, abd pain EXAM: PORTABLE CHEST 1 VIEW COMPARISON:  Chest x-ray dated 07/01/2021. FINDINGS: Heart size and mediastinal contours are stable. Lungs are clear. No pleural effusion or pneumothorax is seen. No acute-appearing osseous abnormality. LEFT chest wall pacemaker/ICD apparatus in place. IMPRESSION: No active disease. No evidence of pneumonia or pulmonary edema. Electronically Signed   By: Franki Cabot M.D.   On: 07/04/2021 10:45   DG Chest Portable 1 View  Result Date: 07/01/2021 CLINICAL DATA:  Centralized chest pain radiating to the back. Nausea and vomiting. EXAM: PORTABLE CHEST 1 VIEW COMPARISON:  02/23/2021 FINDINGS: Cardiac pacemaker. Borderline heart size with  normal pulmonary vascularity. No airspace disease or consolidation in the lungs. No pleural effusions. No pneumothorax. Mediastinal contours appear intact. Calcification of the aorta. IMPRESSION: Borderline heart size.  No active pulmonary disease. Electronically Signed   By: Lucienne Capers M.D.   On: 07/01/2021 23:23   ECHOCARDIOGRAM COMPLETE  Result Date: 07/02/2021    ECHOCARDIOGRAM REPORT   Patient Name:   LAURALEI CLOUSE Date of Exam: 07/02/2021 Medical Rec #:  [id number]    Height:       65.0 in Accession #:    [id number]   Weight:       209.9 lb Date of Birth:  1941-07-09    BSA:          2.019 m Patient Age:    80 years     BP:           141/88 mmHg Patient Gender: F            HR:           80 bpm. Exam Location:  [facility] Procedure: 2D Echo, Cardiac Doppler and Color Doppler Indications:    Chest Pain  History:        Patient has prior history of Echocardiogram examinations, most                 recent 10/26/2018. Pacemaker; Risk Factors:Hypertension, Diabetes  and Dyslipidemia.  Sonographer:    Wenda Low Referring Phys: [id number] [place]  1. Hypokinesis of the distal inferior/inferoseptal walls. Changes appear more prominent than in previous echo in 2017. Marland Kitchen Left ventricular ejection fraction, by estimation, is 55 to 60%. The left ventricle has normal function. 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 Elevated left atrial pressure.  2. Right ventricular systolic function is normal. The right ventricular size is normal. There is normal pulmonary artery systolic pressure.  3. The mitral valve is normal in structure. Mild to moderate mitral valve regurgitation.  4. The aortic valve is tricuspid. Aortic valve regurgitation is not visualized. Mild to moderate aortic valve sclerosis/calcification is present, without any evidence of aortic stenosis. FINDINGS  Left Ventricle: Hypokinesis of the distal inferior/inferoseptal walls. Changes appear more prominent than  in previous echo in 2017. Left ventricular ejection fraction, by estimation, is 55 to 60%. The left ventricle has normal function. The left ventricular internal cavity size was normal in size. 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 Elevated left atrial pressure. Right Ventricle: The right ventricular size is normal. Right vetricular wall thickness was not assessed. Right ventricular systolic function is normal. There is normal pulmonary artery systolic pressure. The tricuspid regurgitant velocity is 2.31 m/s, and with an assumed right atrial pressure of 3 mmHg, the estimated right ventricular systolic pressure is 37.6 mmHg. Left Atrium: Left atrial size was normal in size. Right Atrium: Right atrial size was normal in size. Pericardium: There is no evidence of pericardial effusion. Mitral Valve: The mitral valve is normal in structure. Mild to moderate mitral valve regurgitation. MV peak gradient, 5.6 mmHg. The mean mitral valve gradient is 2.0 mmHg. Tricuspid Valve: The tricuspid valve is normal in structure. Tricuspid valve regurgitation is mild. Aortic Valve: The aortic valve is tricuspid. Aortic valve regurgitation is not visualized. Mild to moderate aortic valve sclerosis/calcification is present, without any evidence of aortic stenosis. Aortic valve mean gradient measures 4.0 mmHg. Aortic valve peak gradient measures 7.8 mmHg. Aortic valve area, by VTI measures 1.60 cm. Pulmonic Valve: The pulmonic valve was grossly normal. Pulmonic valve regurgitation is not visualized. Aorta: The aortic root and ascending aorta are structurally normal, with no evidence of dilitation. IAS/Shunts: No atrial level shunt detected by color flow Doppler. Additional Comments: Johnnye Sandford device lead is visualized.  LEFT VENTRICLE PLAX 2D LVIDd:         4.37 cm      Diastology LVIDs:         3.24 cm      LV e' medial:    4.47 cm/s LV PW:         1.62 cm      LV E/e' medial:  19.1 LV IVS:        1.56 cm      LV e' lateral:   8.11  cm/s LVOT diam:     2.00 cm      LV E/e' lateral: 10.5 LV SV:         52 LV SV Index:   26 LVOT Area:     3.14 cm  LV Volumes (MOD) LV vol d, MOD A2C: 122.0 ml LV vol d, MOD A4C: 138.0 ml LV vol s, MOD A2C: 53.1 ml LV vol s, MOD A4C: 66.8 ml LV SV MOD A2C:     68.9 ml LV SV MOD A4C:     138.0 ml LV SV MOD BP:      72.7 ml RIGHT VENTRICLE RV Basal diam:  3.50 cm RV Mid diam:    2.76 cm RV S prime:     8.19 cm/s TAPSE (M-mode): 2.5 cm LEFT ATRIUM             Index       RIGHT ATRIUM           Index LA diam:        4.10 cm 2.03 cm/m  RA Area:     16.40 cm LA Vol (A2C):   55.3 ml 27.39 ml/m RA Volume:   43.00 ml  21.30 ml/m LA Vol (A4C):   58.4 ml 28.95 ml/m LA Biplane Vol: 61.6 ml 30.51 ml/m  AORTIC VALVE AV Area (Vmax):    1.66 cm AV Area (Vmean):   1.54 cm AV Area (VTI):     1.60 cm AV Vmax:           140.00 cm/s AV Vmean:          97.200 cm/s AV VTI:            0.322 m AV Peak Grad:      7.8 mmHg AV Mean Grad:      4.0 mmHg LVOT Vmax:         73.80 cm/s LVOT Vmean:        47.700 cm/s LVOT VTI:          0.164 m LVOT/AV VTI ratio: 0.51  AORTA Ao Root diam: 2.90 cm Ao Asc diam:  3.10 cm MITRAL VALVE                 TRICUSPID VALVE MV Area (PHT): 2.71 cm      TR Peak grad:   21.3 mmHg MV Area VTI:   1.53 cm      TR Vmax:        231.00 cm/s MV Peak grad:  5.6 mmHg MV Mean grad:  2.0 mmHg      SHUNTS MV Vmax:       1.18 m/s      Systemic VTI:  0.16 m MV Vmean:      64.4 cm/s     Systemic Diam: 2.00 cm MV Decel Time: 280 msec MR Peak grad:    96.0 mmHg MR Mean grad:    47.0 mmHg MR Vmax:         490.00 cm/s MR Vmean:        291.0 cm/s MR PISA:         1.57 cm MR PISA Eff ROA: 30 mm MR PISA Radius:  0.50 cm MV E velocity: 85.30 cm/s MV Tyshon Fanning velocity: 91.30 cm/s MV E/Brewer Hitchman ratio:  0.93 Dorris Carnes MD Electronically signed by Dorris Carnes MD Signature Date/Time: 07/02/2021/6:37:08 PM    Final    CT Angio Chest/Abd/Pel for Dissection W and/or Wo Contrast  Result Date: 07/02/2021 CLINICAL DATA:  Centralized chest pain  radiating to the back. Nausea. EXAM: CT ANGIOGRAPHY CHEST, ABDOMEN AND PELVIS TECHNIQUE: Non-contrast CT of the chest was initially obtained. Multidetector CT imaging through the chest, abdomen and pelvis was performed using the standard protocol during bolus administration of intravenous contrast. Multiplanar reconstructed images and MIPs were obtained and reviewed to evaluate the vascular anatomy. CONTRAST:  191mL OMNIPAQUE IOHEXOL 350 MG/ML SOLN COMPARISON:  CT chest 11/04/2016 FINDINGS: CTA CHEST FINDINGS Cardiovascular: Noncontrast images of the chest demonstrate scattered calcification in the aorta. Mild coronary artery calcifications. No evidence of intramural hematoma. Images obtained during the arterial phase after contrast injection demonstrates normal caliber thoracic  aorta. No aortic dissection. Great vessel origins are patent. No evidence of pulmonary embolus. Normal heart size. No pericardial effusion. Cardiac pacemaker. Mediastinum/Nodes: No enlarged mediastinal, hilar, or axillary lymph nodes. Thyroid gland, trachea, and esophagus demonstrate no significant findings. Lungs/Pleura: Mild atelectasis in the lung bases. Lungs are otherwise clear. No pleural effusions. No pneumothorax. Airways are patent. Musculoskeletal: Degenerative changes in the spine. No destructive bone lesions. Review of the MIP images confirms the above findings. CTA ABDOMEN AND PELVIS FINDINGS VASCULAR Aorta: Diffuse aortic calcification. No aneurysm or dissection. No critical stenosis. Celiac: The celiac axis is patent although there is evidence of focal stenosis at the origin. SMA: Patent without evidence of aneurysm, dissection, vasculitis or significant stenosis. Renals: Both renal arteries are patent without evidence of aneurysm, dissection, vasculitis, fibromuscular dysplasia or significant stenosis. IMA: Patent without evidence of aneurysm, dissection, vasculitis or significant stenosis. Inflow: Patent without evidence of  aneurysm, dissection, vasculitis or significant stenosis. Veins: No obvious venous abnormality within the limitations of this arterial phase study. Review of the MIP images confirms the above findings. NON-VASCULAR Hepatobiliary: Several low-attenuation lesions in the liver most likely representing cysts. Gallbladder is mildly distended without stone or wall thickening. No bile duct dilatation. Pancreas: Mildly dilated pancreatic duct. No discrete mass is identified. Consider nonemergent MRCP for follow-up. Spleen: Normal in size without focal abnormality. Adrenals/Urinary Tract: Adrenal glands are unremarkable. Kidneys are normal, without renal calculi, focal lesion, or hydronephrosis. Bladder is unremarkable. Stomach/Bowel: Stomach is within normal limits. Appendix appears normal. No evidence of bowel wall thickening, distention, or inflammatory changes. Lymphatic: No significant lymphadenopathy. Reproductive: Uterus and ovaries are not enlarged. Other: No free air or free fluid in the abdomen. Small periumbilical hernia containing fat. Midline abdominal wall scarring is likely postoperative. Musculoskeletal: Degenerative changes in the spine. No destructive bone lesions. Review of the MIP images confirms the above findings. IMPRESSION: 1. Moderate diffuse aortic calcifications/atherosclerosis. No evidence of aortic aneurysm or dissection. 2. No evidence of active pulmonary disease. 3. Dilated pancreatic duct without discrete mass identified. Consider nonemergent MRCP for follow-up. 4. Small periumbilical hernia containing fat. Electronically Signed   By: Lucienne Capers M.D.   On: 07/02/2021 01:13   US Abdomen Limited RUQ (LIVER/GB)  Result Date: 07/04/2021 CLINICAL DATA:  Abdominal pain x3 days EXAM: ULTRASOUND ABDOMEN LIMITED RIGHT UPPER QUADRANT COMPARISON:  CT July 02, 2021. FINDINGS: Gallbladder: Gallbladder is distended without gallstones or wall thickening visualized. No sonographic Murphy sign noted  by sonographer. Common bile duct: Diameter: 4 mm Liver: No focal lesion identified. Within normal limits in parenchymal echogenicity. Portal vein is patent on color Doppler imaging with normal direction of blood flow towards the liver. Other: Suboptimal examination of the right upper quadrant secondary to patient habitus. IMPRESSION: No acute sonographic finding in the right upper quadrant on this examination which is suboptimal secondary to patient habitus. Electronically Signed   By: Dahlia Bailiff MD   On: 07/04/2021 16:29    Microbiology: Recent Results (from the past 240 hour(s))  Resp Panel by RT-PCR (Flu Phillip Maffei&B, Covid) Nasopharyngeal Swab     Status: None   Collection Time: 07/02/21  1:45 AM   Specimen: Nasopharyngeal Swab; Nasopharyngeal(NP) swabs in vial transport medium  Result Value Ref Range Status   SARS Coronavirus 2 by RT PCR NEGATIVE NEGATIVE Final    Comment: (NOTE) SARS-CoV-2 target nucleic acids are NOT DETECTED.  The SARS-CoV-2 RNA is generally detectable in upper respiratory specimens during the acute phase of infection. The lowest concentration of  SARS-CoV-2 viral copies this assay can detect is 138 copies/mL. Marlaya Turck negative result does not preclude SARS-Cov-2 infection and should not be used as the sole basis for treatment or other patient management decisions. Lynnley Doddridge negative result may occur with  improper specimen collection/handling, submission of specimen other than nasopharyngeal swab, presence of viral mutation(s) within the areas targeted by this assay, and inadequate number of viral copies(<138 copies/mL). Adonai Helzer negative result must be combined with clinical observations, patient history, and epidemiological information. The expected result is Negative.  Fact Sheet for Patients:  EntrepreneurPulse.com.au  Fact Sheet for Healthcare Providers:  IncredibleEmployment.be  This test is no t yet approved or cleared by the [place] FDA  and  has been authorized for detection and/or diagnosis of SARS-CoV-2 by FDA under an Emergency Use Authorization (EUA). This EUA will remain  in effect (meaning this test can be used) for the duration of the COVID-19 declaration under Section 564(b)(1) of the Act, 21 U.S.C.section 360bbb-3(b)(1), unless the authorization is terminated  or revoked sooner.       Influenza Kailan Carmen by PCR NEGATIVE NEGATIVE Final   Influenza B by PCR NEGATIVE NEGATIVE Final    Comment: (NOTE) The Xpert Xpress SARS-CoV-2/FLU/RSV plus assay is intended as an aid in the diagnosis of influenza from Nasopharyngeal swab specimens and should not be used as Chasya Zenz sole basis for treatment. Nasal washings and aspirates are unacceptable for Xpert Xpress SARS-CoV-2/FLU/RSV testing.  Fact Sheet for Patients: EntrepreneurPulse.com.au  Fact Sheet for Healthcare Providers: IncredibleEmployment.be  This test is not yet approved or cleared by the [place] FDA and has been authorized for detection and/or diagnosis of SARS-CoV-2 by FDA under an Emergency Use Authorization (EUA). This EUA will remain in effect (meaning this test can be used) for the duration of the COVID-19 declaration under Section 564(b)(1) of the Act, 21 U.S.C. section 360bbb-3(b)(1), unless the authorization is terminated or revoked.  Performed at [facility], [address] [place], [place] [address]   Culture, blood (routine x 2)     Status: None   Collection Time: 07/04/21 10:28 AM   Specimen: BLOOD RIGHT HAND  Result Value Ref Range Status   Specimen Description BLOOD RIGHT HAND  Final   Special Requests   Final    BOTTLES DRAWN AEROBIC ONLY Blood Culture results may not be optimal due to an inadequate volume of blood received in culture bottles   Culture   Final    NO GROWTH 5 DAYS Performed at [facility], [place] [address], [place], [place] [address]    Report Status 07/09/2021 FINAL  Final   Culture, blood (routine x 2)     Status: Abnormal   Collection Time: 07/04/21 10:28 AM   Specimen: BLOOD LEFT HAND  Result Value Ref Range Status   Specimen Description BLOOD LEFT HAND  Final   Special Requests   Final    BOTTLES DRAWN AEROBIC ONLY Blood Culture adequate volume   Culture  Setup Time   Final    GRAM NEGATIVE RODS AEROBIC BOTTLE ONLY CRITICAL RESULT CALLED TO, READ BACK BY AND VERIFIED WITH: [place] BY [place]. AT [phone] ON 7 11 2022 Performed at [facility], [place] [address], [place],  [address]    Culture ESCHERICHIA COLI (Nakiah Osgood)  Final   Report Status 07/07/2021 FINAL  Final   Organism ID, Bacteria ESCHERICHIA COLI  Final      Susceptibility   Escherichia coli - MIC*    AMPICILLIN  8 SENSITIVE Sensitive     CEFAZOLIN <=4 SENSITIVE Sensitive     CEFEPIME <=0.12 SENSITIVE Sensitive     CEFTAZIDIME <=1 SENSITIVE Sensitive     CEFTRIAXONE <=0.25 SENSITIVE Sensitive     CIPROFLOXACIN <=0.25 SENSITIVE Sensitive     GENTAMICIN <=1 SENSITIVE Sensitive     IMIPENEM <=0.25 SENSITIVE Sensitive     TRIMETH/SULFA <=20 SENSITIVE Sensitive     AMPICILLIN/SULBACTAM 4 SENSITIVE Sensitive     PIP/TAZO <=4 SENSITIVE Sensitive     * ESCHERICHIA COLI  Blood Culture ID Panel (Reflexed)     Status: Abnormal   Collection Time: 07/04/21 10:28 AM  Result Value Ref Range Status   Enterococcus faecalis NOT DETECTED NOT DETECTED Final   Enterococcus Faecium NOT DETECTED NOT DETECTED Final   Listeria monocytogenes NOT DETECTED NOT DETECTED Final   Staphylococcus species NOT DETECTED NOT DETECTED Final   Staphylococcus aureus (BCID) NOT DETECTED NOT DETECTED Final   Staphylococcus epidermidis NOT DETECTED NOT DETECTED Final   Staphylococcus lugdunensis NOT DETECTED NOT DETECTED Final   Streptococcus species NOT DETECTED NOT DETECTED Final   Streptococcus agalactiae NOT DETECTED NOT DETECTED Final   Streptococcus pneumoniae NOT DETECTED NOT DETECTED Final    Streptococcus pyogenes NOT DETECTED NOT DETECTED Final   Deya Bigos.calcoaceticus-baumannii NOT DETECTED NOT DETECTED Final   Bacteroides fragilis NOT DETECTED NOT DETECTED Final   Enterobacterales DETECTED (Jaydon Soroka) NOT DETECTED Final    Comment: Enterobacterales represent Lillyonna Armstead large order of gram negative bacteria, not Soriyah Osberg single organism. CRITICAL RESULT CALLED TO, READ BACK BY AND VERIFIED WITH: PHARMD MIRANDA BRYK BY MESSAN H. AT [phone] ON 7 11 2022    Enterobacter cloacae complex NOT DETECTED NOT DETECTED Final   Escherichia coli DETECTED (Roxie Kreeger) NOT DETECTED Final    Comment: CRITICAL RESULT CALLED TO, READ BACK BY AND VERIFIED WITH: PHARMD MIRANDA BRYK BY MESSAN H. AT 0307 ON 7 11 2022    Klebsiella aerogenes NOT DETECTED NOT DETECTED Final   Klebsiella oxytoca NOT DETECTED NOT DETECTED Final   Klebsiella pneumoniae NOT DETECTED NOT DETECTED Final   Proteus species NOT DETECTED NOT DETECTED Final   Salmonella species NOT DETECTED NOT DETECTED Final   Serratia marcescens NOT DETECTED NOT DETECTED Final   Haemophilus influenzae NOT DETECTED NOT DETECTED Final   Neisseria meningitidis NOT DETECTED NOT DETECTED Final   Pseudomonas aeruginosa NOT DETECTED NOT DETECTED Final   Stenotrophomonas maltophilia NOT DETECTED NOT DETECTED Final   Candida albicans NOT DETECTED NOT DETECTED Final   Candida auris NOT DETECTED NOT DETECTED Final   Candida glabrata NOT DETECTED NOT DETECTED Final   Candida krusei NOT DETECTED NOT DETECTED Final   Candida parapsilosis NOT DETECTED NOT DETECTED Final   Candida tropicalis NOT DETECTED NOT DETECTED Final   Cryptococcus neoformans/gattii NOT DETECTED NOT DETECTED Final   CTX-M ESBL NOT DETECTED NOT DETECTED Final   Carbapenem resistance IMP NOT DETECTED NOT DETECTED Final   Carbapenem resistance KPC NOT DETECTED NOT DETECTED Final   Carbapenem resistance NDM NOT DETECTED NOT DETECTED Final   Carbapenem resist OXA 48 LIKE NOT DETECTED NOT DETECTED Final   Carbapenem  resistance VIM NOT DETECTED NOT DETECTED Final    Comment: Performed at [facility], [address], [place], [place] [address]  Culture, Urine     Status: None   Collection Time: 07/05/21 12:20 PM   Specimen: Urine, Catheterized  Result Value Ref Range Status   Specimen Description URINE, CATHETERIZED  Final   Special Requests NONE  Final   Culture   Final    NO GROWTH Performed at [facility], [place] [address] [place], [place] [address]    Report Status 07/06/2021 FINAL  Final  Culture, blood (routine x 2)     Status: None (Preliminary result)   Collection Time: 07/06/21  9:24 AM   Specimen: BLOOD  Result Value Ref Range Status   Specimen Description BLOOD LEFT ANTECUBITAL  Final   Special Requests   Final    BOTTLES DRAWN AEROBIC AND ANAEROBIC Blood Culture results may not be optimal due to an excessive volume of blood received in culture bottles   Culture   Final    NO GROWTH 4 DAYS Performed at [facility], [place] [address] [place], [place] [address]    Report Status PENDING  Incomplete  Culture, blood (routine x 2)     Status: None (Preliminary result)   Collection Time: 07/06/21  9:30 AM   Specimen: BLOOD RIGHT FOREARM  Result Value Ref Range Status   Specimen Description BLOOD RIGHT FOREARM  Final   Special Requests   Final    BOTTLES DRAWN AEROBIC AND ANAEROBIC Blood Culture adequate volume   Culture   Final    NO GROWTH 4 DAYS Performed at [facility], [place] [address] [place], [place] [address]    Report Status PENDING  Incomplete     Labs: Basic Metabolic Panel: Recent Labs  Lab 07/07/21 0650 07/08/21 0114 07/09/21 0233 07/10/21 0248 07/11/21 0139  NA 140 142 143 141 138  K 3.8 3.7 3.7 3.4* 3.4*  CL 111 112* 115* 113* 110  CO2 24 24 25 23 24   GLUCOSE 113* 115* 123* 118* 139*  BUN 18 13 9 8  5*  CREATININE 0.93 0.89 0.83 0.68 0.69  CALCIUM 7.9* 7.8* 8.0* 7.8* 7.7*  MG 2.1 2.0 1.8 1.7 1.6*  PHOS  --   --  2.0* 2.0* 2.3*    Liver Function Tests: Recent Labs  Lab 07/07/21 0650 07/08/21 0114 07/09/21 0233 07/10/21 0248 07/11/21 0139  AST 12* 12* 18 14* 12*  ALT 9 7 8 7 7   ALKPHOS 50 56 52 49 44  BILITOT 0.8 0.5 0.3 0.5 0.3  PROT 6.1* 5.7* 5.9* 5.6* 5.5*  ALBUMIN 1.9* 1.6* 1.6* 1.7* 1.6*   Recent Labs  Lab 07/04/21 1145  LIPASE 23   No results for input(s): AMMONIA in the last 168 hours. CBC: Recent Labs  Lab 07/07/21 0650 07/08/21 0114 07/09/21 0233 07/10/21 0248 07/11/21 0139  WBC 18.2* 16.2* 12.7* 9.9 8.5  NEUTROABS  --   --  9.9* 6.4 5.2  HGB 11.0* 10.3* 9.8* 9.8* 9.7*  HCT 34.0* 32.1* 31.7* 30.9* 30.2*  MCV 96.3 96.4 98.1 96.3 95.9  PLT 159 174 181 184 231   Cardiac Enzymes: No results for input(s): CKTOTAL, CKMB, CKMBINDEX, TROPONINI in the last 168 hours. BNP: BNP (last 3 results) No results for input(s): BNP in the last 8760 hours.  ProBNP (last 3 results) No results for input(s): PROBNP in the last 8760 hours.  CBG: Recent Labs  Lab 07/10/21 0819 07/10/21 1139 07/10/21 1627 07/10/21 2106 07/11/21 0752  GLUCAP 128* 185* 113* 168* 122*       Signed:  Fayrene Helper MD.  [facility] 07/11/2021, 11:02 AM

## 2021-07-11 NOTE — TOC Transition Note (Signed)
Transition of Care (TOC) - CM/SW Discharge Note Natalie Gibbons RN, BSN Transitions of Care Unit 4E- RN Case Manager See Treatment Team for direct phone #  [place]  Patient Details  Name: Natalie Castro MRN: [id number] Date of Birth: 1941-05-08  Transition of Care [facility]) CM/SW Contact:  Natalie Patricia, RN Phone Number: 07/11/2021, 1:24 PM   Clinical Narrative:    Pt stable for transition home today, orders placed for [facility] and DME needs-  CM in to speak with pt and daughter at the bedside- discussed transition needs- for DME and HH.  Pt confirmed she needs 3n1 and hospital bed- pt however is going to her daughter Natalie Castro's home here in [place] and wants DME delivered there; Address to [facility] home: [address] [place] [address] Phone: [phone] List provided for [facility] choice- Per CMS guidelines from medicare.gov website with star ratings (copy placed in shadow chart) - per pt/daughter their first choice if [facility], alternate is [facility].  Call made to [facility] for DME needs- 3n1 and hospital bed to be delivered to daughter's home address provided- (per pt does not have to be delivered prior to d/c)  Call made to [facility]- spoke with [name] regarding referral- however- they are unable to accept at this time due to staffing  Call made to [facility] with [place] for [facility] Referral- received msg back from [facility] that they are also unable to accept due to low nursing staffing.   Went back to patient to let her know about [place] barrier and see if she had any further preference- per pt she does not have any further preference and will defer to [facility] to secure a [place] agency on her behalf-  Call made to [facility] with [facility]- referral has been accepted for needed services- CM will call pt and let her know [facility] agency that has been secured.   Final next level of care: [place] Barriers to Discharge: No Barriers Identified   Patient Goals and CMS Choice Patient states  their goals for this hospitalization and ongoing recovery are:: return home- going home w/ daughter CMS Medicare.gov Compare Post Acute Care list provided to:: Patient Choice offered to / list presented to : Patient, Adult Children  Discharge Placement                   Home w/ [facility]    Discharge Plan and Services   Discharge Planning Services: CM Consult Post Acute Care Choice: Durable Medical Equipment, Home Health          DME Arranged: 3-N-1, Hospital bed DME Agency: [facility] Date DME Agency Contacted: 07/11/21 Time DME Agency Contacted: 39 Representative spoke with at DME Agency: [place]: RN, PT, Nurse's Aide, Social Work [employer] Agency: [place] Date [place]: 07/11/21 Time [place]: 1250 Representative spoke with at [place]: [place] ([place]) Interventions     Readmission Risk Interventions Readmission Risk Prevention Plan 07/11/2021  Transportation Screening Complete  PCP or Specialist Appt within 5-7 Days Complete  Home Care Screening Complete  Medication Review (RN CM) Complete  Some recent data might be hidden

## 2021-07-11 NOTE — Plan of Care (Signed)

## 2021-07-11 NOTE — Plan of Care (Signed)
  Problem: Education: Goal: Knowledge of General Education information will improve Description: Including pain rating scale, medication(s)/side effects and non-pharmacologic comfort measures 07/11/2021 1120 by Camillia Herter, RN Outcome: Adequate for Discharge 07/11/2021 0733 by Camillia Herter, RN Outcome: Progressing   Problem: Health Behavior/Discharge Planning: Goal: Ability to manage health-related needs will improve 07/11/2021 1120 by Camillia Herter, RN Outcome: Adequate for Discharge 07/11/2021 0733 by Camillia Herter, RN Outcome: Progressing   Problem: Clinical Measurements: Goal: Ability to maintain clinical measurements within normal limits will improve 07/11/2021 1120 by Camillia Herter, RN Outcome: Adequate for Discharge 07/11/2021 0733 by Camillia Herter, RN Outcome: Progressing Goal: Will remain free from infection 07/11/2021 1120 by Camillia Herter, RN Outcome: Adequate for Discharge 07/11/2021 0733 by Camillia Herter, RN Outcome: Progressing Goal: Diagnostic test results will improve 07/11/2021 1120 by Camillia Herter, RN Outcome: Adequate for Discharge 07/11/2021 0733 by Camillia Herter, RN Outcome: Progressing Goal: Respiratory complications will improve 07/11/2021 1120 by Camillia Herter, RN Outcome: Adequate for Discharge 07/11/2021 0733 by Camillia Herter, RN Outcome: Progressing Goal: Cardiovascular complication will be avoided 07/11/2021 1120 by Camillia Herter, RN Outcome: Adequate for Discharge 07/11/2021 0733 by Camillia Herter, RN Outcome: Progressing   Problem: Activity: Goal: Risk for activity intolerance will decrease 07/11/2021 1120 by Camillia Herter, RN Outcome: Adequate for Discharge 07/11/2021 0733 by Camillia Herter, RN Outcome: Progressing   Problem: Nutrition: Goal: Adequate nutrition will be maintained 07/11/2021 1120 by Camillia Herter, RN Outcome: Adequate for Discharge 07/11/2021 0733 by Camillia Herter, RN Outcome: Progressing   Problem:  Coping: Goal: Level of anxiety will decrease 07/11/2021 1120 by Camillia Herter, RN Outcome: Adequate for Discharge 07/11/2021 0733 by Camillia Herter, RN Outcome: Progressing   Problem: Elimination: Goal: Will not experience complications related to bowel motility 07/11/2021 1120 by Camillia Herter, RN Outcome: Adequate for Discharge 07/11/2021 0733 by Camillia Herter, RN Outcome: Progressing Goal: Will not experience complications related to urinary retention 07/11/2021 1120 by Camillia Herter, RN Outcome: Adequate for Discharge 07/11/2021 0733 by Camillia Herter, RN Outcome: Progressing   Problem: Pain Managment: Goal: General experience of comfort will improve 07/11/2021 1120 by Camillia Herter, RN Outcome: Adequate for Discharge 07/11/2021 0733 by Camillia Herter, RN Outcome: Progressing   Problem: Safety: Goal: Ability to remain free from injury will improve 07/11/2021 1120 by Camillia Herter, RN Outcome: Adequate for Discharge 07/11/2021 0733 by Camillia Herter, RN Outcome: Progressing   Problem: Skin Integrity: Goal: Risk for impaired skin integrity will decrease 07/11/2021 1120 by Camillia Herter, RN Outcome: Adequate for Discharge 07/11/2021 0733 by Camillia Herter, RN Outcome: Progressing

## 2021-07-12 ENCOUNTER — Other Ambulatory Visit: Payer: Self-pay | Admitting: Student

## 2021-07-12 DIAGNOSIS — K810 Acute cholecystitis: Secondary | ICD-10-CM

## 2021-07-13 ENCOUNTER — Telehealth: Payer: Self-pay

## 2021-07-13 NOTE — Telephone Encounter (Signed)
-----   Message from Evans Lance, MD sent at 07/10/2021  9:32 PM EDT ----- Ok to reprogram AV delay to 160/180 sensed/paced. ----- Message ----- From: Deboraha Sprang, MD Sent: 07/03/2021  12:20 PM EDT To: Evans Lance, MD, Simone Curia, RN  Ladies and gents  this lady is programmed VIP with an AV delay on ECG of about 35o msec  maybe worth thinking of turning VIP off

## 2021-07-13 NOTE — Telephone Encounter (Signed)
Placed in phone note for upcoming visit A. Tillery to see to program device per Dr. Lovena Le.

## 2021-07-14 ENCOUNTER — Other Ambulatory Visit: Payer: Self-pay | Admitting: General Surgery

## 2021-07-14 ENCOUNTER — Other Ambulatory Visit: Payer: Self-pay | Admitting: Student

## 2021-07-14 DIAGNOSIS — K810 Acute cholecystitis: Secondary | ICD-10-CM

## 2021-07-21 NOTE — Progress Notes (Signed)
Electrophysiology Office Note Date: 07/22/2021  ID:  Natalie Castro, DOB July 08, 1941, MRN [id number]  PCP: [name], MD Primary Cardiologist: Cristopher Peru, MD Electrophysiologist: Cristopher Peru, MD   CC: Pacemaker follow-up  Natalie Castro is a 80 y.o. female seen today for Cristopher Peru, MD for post hospital follow up.    Admitted 7/7 - 07/11/21 for chest pain. 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 Echo with LVEF 55-60%. Developed fever with SIRS symptomatology.  She was found to have e. Coli bacteremia in the setting of acute cholecystitis.  Surgery was c/s.  IR placed perc chole tube.  Planning for outpatient follow up.      Since discharge from hospital the patient reports doing OK. Energy slowly improving.  she denies chest pain, palpitations, dyspnea, PND, orthopnea, nausea, vomiting, dizziness, syncope, edema, weight gain, or early satiety.  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, long term anticoag with apixaban    Hyperlipidemia    Myoview 03/2021    Myoview 4/22: EF 50, no infarct or ischemia; low risk   Presence of permanent cardiac pacemaker    Type 2 diabetes mellitus ([place])    Past Surgical History:  Procedure Laterality Date   COLONOSCOPY     3 SIMPLE ADENOMAS, AGE 60   COLONOSCOPY N/A 05/24/2018   Procedure: COLONOSCOPY;  Surgeon: Natalie Binder, MD;  [facility];  Service: Endoscopy;  Laterality: N/A;  1:00pm   EP IMPLANTABLE DEVICE N/A 11/24/2015   Procedure: Pacemaker Implant;  Surgeon: Natalie Grayer, MD;  Location: [place] [facility];  Service: Cardiovascular;  Laterality: N/A;   IR PERC CHOLECYSTOSTOMY  07/08/2021   JOINT REPLACEMENT     knees  bilat.   LEFT HEART CATH AND CORONARY ANGIOGRAPHY N/A 07/02/2021   Procedure: LEFT HEART CATH AND CORONARY ANGIOGRAPHY;  Surgeon: Natalie Booze, MD;  Location: [place] [facility];  Service: Cardiovascular;  Laterality: N/A;   POLYPECTOMY  05/24/2018   Procedure: POLYPECTOMY;  Surgeon: Natalie Binder, MD;  [facility];  Service: Endoscopy;;  ascending and hepatic flexure, transverse   TUBAL LIGATION      Current Outpatient Medications  Medication Sig Dispense Refill   atorvastatin (LIPITOR) 40 MG tablet TAKE (1) TABLET BY MOUTH AT BEDTIME. 30 tablet 0   ELIQUIS 5 MG TABS tablet TAKE 1 TABLET BY MOUTH TWICE A DAY. 60 tablet 0   glucose blood test strip Use to check blood glucose fasting , before lunch and dinner and after your largest meal 120 each 12   hydrALAZINE (APRESOLINE) 50 MG tablet Take 1 tablet (50 mg total) by mouth 2 (two) times daily. 60 tablet 0   Lancets (ONETOUCH ULTRASOFT) lancets Use to check glucose 4 x daily 120 each 12   LANTUS SOLOSTAR 100 UNIT/ML Solostar Pen Inject 30 Units into the skin at bedtime. 10 pen 2   losartan (COZAAR) 100 MG tablet TAKE (1) TABLET BY MOUTH ONCE DAILY. 30 tablet 0   Magnesium Oxide 400 MG CAPS Take 1 capsule (400 mg total) by mouth daily. 30 capsule 0   metFORMIN (GLUCOPHAGE) 1000 MG tablet  TAKE (1) TABLET BY MOUTH TWICE DAILY. 60 tablet 2   metoprolol succinate (TOPROL-XL) 25 MG 24 hr tablet Take 1 tablet (25 mg total) by mouth daily. 90 tablet 3   nitroGLYCERIN (NITROSTAT) 0.4 MG SL tablet Place 1 tablet (0.4 mg total) under the tongue every 5 (five) minutes as needed for chest pain. (Patient not taking: Reported on 07/22/2021) 25 tablet 3   No current facility-administered medications for this visit.    Allergies:   Penicillins   Social History: Social History   Socioeconomic History   Marital status: Widowed    Spouse name: Not on file   Number of children: Not on file   Years of education: Not on file   Highest  education level: Not on file  Occupational History   Occupation: retired  Tobacco Use   Smoking status: Never   Smokeless tobacco: Never  Vaping Use   Vaping Use: Never used  Substance and Sexual Activity   Alcohol use: No    Alcohol/week: 0.0 standard drinks   Drug use: No   Sexual activity: Yes    Birth control/protection: Post-menopausal  Other Topics Concern   Not on file  Social History Narrative   MARRIED FOR 75 YRS. 5 KIDS: #4 PRESENT TODAY(AGE 64)   Social Determinants of Health   Financial Resource Strain: Not on file  Food Insecurity: Not on file  Transportation Needs: Not on file  Physical Activity: Not on file  Stress: Not on file  Social Connections: Not on file  Intimate Partner Violence: Not on file    Family History: Family History  Problem Relation Age of Onset   Heart failure Mother    Hypertension Mother    Heart failure Sister    Hypertension Father    Cancer Sister    Dementia Sister    Colon cancer Neg Hx    Colon polyps Neg Hx      Review of Systems: All other systems reviewed and are otherwise negative except as noted above.  Physical Exam: Vitals:   07/22/21 1054  BP: (!) 158/84  Pulse: 99  SpO2: 97%  Weight: 207 lb (93.9 kg)  Height: '5\' 5"'$  (1.651 m)     GEN- The patient is well appearing, alert and oriented x 3 today.   HEENT: normocephalic, atraumatic; sclera clear, conjunctiva pink; hearing intact; oropharynx clear; neck supple  Lungs- Clear to ausculation bilaterally, normal work of breathing.  No wheezes, rales, rhonchi Heart- Regular rate and rhythm, no murmurs, rubs or gallops  GI- soft, non-tender, non-distended, bowel sounds present  Extremities- no clubbing or cyanosis. No edema MS- no significant deformity or atrophy Skin- warm and dry, no rash or lesion; PPM pocket well healed Psych- euthymic mood, full affect Neuro- strength and sensation are intact  PPM Interrogation- reviewed in detail today,  See PACEART  report  EKG:  EKG is not ordered today.  Recent Labs: 07/11/2021: ALT 7; BUN 5; Creatinine, Ser 0.69; Hemoglobin 9.7; Magnesium 1.6; Platelets 231; Potassium 3.4; Sodium 138   Wt Readings from Last 3 Encounters:  07/22/21 207 lb (93.9 kg)  07/02/21 209 lb 14.1 oz (95.2 kg)  05/20/21 209 lb 6.4 oz (95 kg)     Other studies Reviewed: Additional studies/ records that were reviewed today include: Previous EP office notes, Previous remote checks, Most recent labwork.   Echo 06/2021 LVEF 55-60%  LHC 07/02/2021 with mild vasospasm and mild non-obstructive CAD  Assessment and Plan:  1. CHB s/p St. Jude PPM  Normal PPM function See Claudia Desanctis Art report No changes today  2. HTN Controlled on current regimen  3. RUQ tenderness S/p IR perc Cholecystostomy Follows up for likely cholecystectomy. Recent cath with mild non-obstructive disease and echo with EF 55-60%. Would be consider reasonable risk to proceed. Would need recommendations on PPM management and holding Eliquis.   4. H/o PE  Back on Eliquis.  Current medicines are reviewed at length with the patient today.   The patient does not have concerns regarding her medicines.  The following changes were made today:  none  Labs/ tests ordered today include:  No orders of the defined types were placed in this encounter.  Disposition:   Follow up with Dr. Lovena Le or EP APP in 6 months. Sooner with issues.   Jacalyn Lefevre, PA-C  07/22/2021 12:43 PM  [place] [address] [phone] (office) [phone] (fax)

## 2021-07-22 ENCOUNTER — Other Ambulatory Visit: Payer: Self-pay

## 2021-07-22 ENCOUNTER — Ambulatory Visit: Payer: Medicare Other | Admitting: Student

## 2021-07-22 ENCOUNTER — Encounter: Payer: Self-pay | Admitting: Student

## 2021-07-22 VITALS — BP 158/84 | HR 99 | Ht 65.0 in | Wt 207.0 lb

## 2021-07-22 DIAGNOSIS — I10 Essential (primary) hypertension: Secondary | ICD-10-CM | POA: Diagnosis not present

## 2021-07-22 DIAGNOSIS — Z86711 Personal history of pulmonary embolism: Secondary | ICD-10-CM

## 2021-07-22 DIAGNOSIS — Z950 Presence of cardiac pacemaker: Secondary | ICD-10-CM | POA: Diagnosis not present

## 2021-07-22 DIAGNOSIS — I442 Atrioventricular block, complete: Secondary | ICD-10-CM

## 2021-07-22 LAB — CUP PACEART INCLINIC DEVICE CHECK
Battery Remaining Longevity: 67 mo
Battery Voltage: 2.99 V
Brady Statistic RA Percent Paced: 1.5 %
Brady Statistic RV Percent Paced: 26 %
Date Time Interrogation Session: 20220728124125
Implantable Lead Implant Date: 20161129
Implantable Lead Implant Date: 20161129
Implantable Lead Location: 753859
Implantable Lead Location: 753860
Implantable Lead Model: 5076
Implantable Pulse Generator Implant Date: 20161129
Lead Channel Impedance Value: 362.5 Ohm
Lead Channel Impedance Value: 487.5 Ohm
Lead Channel Pacing Threshold Amplitude: 0.75 V
Lead Channel Pacing Threshold Amplitude: 0.75 V
Lead Channel Pacing Threshold Amplitude: 0.75 V
Lead Channel Pacing Threshold Pulse Width: 0.4 ms
Lead Channel Pacing Threshold Pulse Width: 0.4 ms
Lead Channel Pacing Threshold Pulse Width: 0.4 ms
Lead Channel Sensing Intrinsic Amplitude: 3.3 mV
Lead Channel Sensing Intrinsic Amplitude: 7.2 mV
Lead Channel Setting Pacing Amplitude: 1.75 V
Lead Channel Setting Pacing Amplitude: 2 V
Lead Channel Setting Pacing Pulse Width: 0.4 ms
Lead Channel Setting Sensing Sensitivity: 2 mV
Pulse Gen Model: 2240
Pulse Gen Serial Number: 7826037

## 2021-07-22 MED ORDER — METOPROLOL SUCCINATE ER 25 MG PO TB24
25.0000 mg | ORAL_TABLET | Freq: Every day | ORAL | 3 refills | Status: DC
Start: 2021-07-22 — End: 2021-08-19

## 2021-07-22 NOTE — Patient Instructions (Signed)

## 2021-07-26 ENCOUNTER — Other Ambulatory Visit: Payer: Self-pay | Admitting: General Surgery

## 2021-07-26 DIAGNOSIS — K810 Acute cholecystitis: Secondary | ICD-10-CM

## 2021-07-27 ENCOUNTER — Other Ambulatory Visit: Payer: Self-pay

## 2021-07-27 ENCOUNTER — Encounter (HOSPITAL_COMMUNITY): Payer: Self-pay | Admitting: Interventional Radiology

## 2021-07-27 ENCOUNTER — Ambulatory Visit (HOSPITAL_COMMUNITY)
Admission: RE | Admit: 2021-07-27 | Discharge: 2021-07-27 | Disposition: A | Discharge status: Home / Self Care | Payer: Medicare Other | Source: Ambulatory Visit | Attending: General Surgery | Admitting: General Surgery

## 2021-07-27 DIAGNOSIS — Z4803 Encounter for change or removal of drains: Secondary | ICD-10-CM | POA: Diagnosis not present

## 2021-07-27 DIAGNOSIS — K810 Acute cholecystitis: Secondary | ICD-10-CM

## 2021-07-27 HISTORY — PX: IR EXCHANGE BILIARY DRAIN: IMG6046

## 2021-07-27 MED ORDER — IOHEXOL 300 MG/ML  SOLN: 50.0000 mL | Freq: Once | INTRAMUSCULAR | Status: DC | PRN

## 2021-07-27 MED ORDER — IOHEXOL 300 MG/ML  SOLN
50.0000 mL | Freq: Once | INTRAMUSCULAR | Status: AC | PRN
  Administered 2021-07-27: 30 mL

## 2021-07-27 MED ORDER — LIDOCAINE HCL 1 % IJ SOLN
INTRAMUSCULAR | Status: AC
  Filled 2021-07-27: qty 20

## 2021-07-27 MED ORDER — LIDOCAINE HCL (PF) 1 % IJ SOLN
INTRAMUSCULAR | Status: DC | PRN
  Administered 2021-07-27: 5 mL

## 2021-07-27 NOTE — Procedures (Signed)
Vascular and Interventional Radiology Procedure Note  Patient: Natalie Castro DOB: 05-24-1941 Medical Record Number: [id number] Note Date/Time: 07/27/21 10:48 AM   Performing Physician: Michaelle Birks, MD Assistant(s): None  Diagnosis: Cholecystitis s/p Indwelling of drainage catheter.  Procedure: BILIARY DRAIN EVALUATION AND EXCHANGE  Anesthesia: Local Anesthetic Complications: None Estimated Blood Loss:  0 mL Specimens: None  Findings:  Drainage tube malfunction (doesn't drain). Therefore, the drainage tube was exchanged for another 63F drain.  Plan: Flush with saline, with frequency as recommended. and Follow up with Surgeon.  See detailed procedure note with images in PACS. The patient tolerated the procedure well without incident or complication and was returned to Recovery in stable condition.    Michaelle Birks, MD [facility] Radiology   Pager. [place]

## 2021-08-05 ENCOUNTER — Other Ambulatory Visit: Payer: Self-pay | Admitting: "Endocrinology

## 2021-08-17 ENCOUNTER — Telehealth: Payer: Self-pay | Admitting: Internal Medicine

## 2021-08-17 NOTE — Telephone Encounter (Signed)
Unable to leave message Will try later./cy

## 2021-08-17 NOTE — Telephone Encounter (Signed)
   Pt c/o Shortness Of Breath: STAT if SOB developed within the last 24 hours or pt is noticeably SOB on the phone  1. Are you currently SOB (can you hear that pt is SOB on the phone)? No  2. How long have you been experiencing SOB? 07/22/21  3. Are you SOB when sitting or when up moving around? Sitting   4. Are you currently experiencing any other symptoms? Pt's daughter said, pt been having SOB since she met Oda Kilts on 07/22/21, she said it is getting worst, today her o2 sat went down to 89% and through the day its been ranging to 91-92%. Home health nurse came yesterday and pt lungs is clear. But pt said she can feel there's something wrong with her breathing and would like to get Dr. Tanna Furry recommendations. She said to call pt's number since she is there with her.

## 2021-08-18 ENCOUNTER — Ambulatory Visit (INDEPENDENT_AMBULATORY_CARE_PROVIDER_SITE_OTHER): Payer: Medicare Other

## 2021-08-18 DIAGNOSIS — I442 Atrioventricular block, complete: Secondary | ICD-10-CM

## 2021-08-18 LAB — CUP PACEART REMOTE DEVICE CHECK
Battery Remaining Longevity: 55 mo
Battery Remaining Percentage: 51 %
Battery Voltage: 2.98 V
Brady Statistic AP VP Percent: 2.2 %
Brady Statistic AP VS Percent: 1 %
Brady Statistic AS VP Percent: 98 %
Brady Statistic AS VS Percent: 1 %
Brady Statistic RA Percent Paced: 2 %
Brady Statistic RV Percent Paced: 99 %
Date Time Interrogation Session: 20220824020013
Implantable Lead Implant Date: 20161129
Implantable Lead Implant Date: 20161129
Implantable Lead Location: 753859
Implantable Lead Location: 753860
Implantable Lead Model: 5076
Implantable Pulse Generator Implant Date: 20161129
Lead Channel Impedance Value: 350 Ohm
Lead Channel Impedance Value: 400 Ohm
Lead Channel Pacing Threshold Amplitude: 0.75 V
Lead Channel Pacing Threshold Amplitude: 1 V
Lead Channel Pacing Threshold Pulse Width: 0.4 ms
Lead Channel Pacing Threshold Pulse Width: 0.4 ms
Lead Channel Sensing Intrinsic Amplitude: 3.6 mV
Lead Channel Sensing Intrinsic Amplitude: 7.8 mV
Lead Channel Setting Pacing Amplitude: 2 V
Lead Channel Setting Pacing Amplitude: 2 V
Lead Channel Setting Pacing Pulse Width: 0.4 ms
Lead Channel Setting Sensing Sensitivity: 2 mV
Pulse Gen Model: 2240
Pulse Gen Serial Number: 7826037

## 2021-08-19 ENCOUNTER — Ambulatory Visit (INDEPENDENT_AMBULATORY_CARE_PROVIDER_SITE_OTHER): Payer: Medicare Other | Admitting: Internal Medicine

## 2021-08-19 ENCOUNTER — Other Ambulatory Visit: Payer: Self-pay

## 2021-08-19 VITALS — BP 196/94 | HR 88 | Ht 65.0 in | Wt 209.0 lb

## 2021-08-19 DIAGNOSIS — R609 Edema, unspecified: Secondary | ICD-10-CM | POA: Diagnosis not present

## 2021-08-19 DIAGNOSIS — Z79899 Other long term (current) drug therapy: Secondary | ICD-10-CM

## 2021-08-19 LAB — COMPREHENSIVE METABOLIC PANEL
ALT: 9 IU/L (ref 0–32)
AST: 16 IU/L (ref 0–40)
Albumin/Globulin Ratio: 1.1 — ABNORMAL LOW (ref 1.2–2.2)
Albumin: 3.7 g/dL (ref 3.7–4.7)
Alkaline Phosphatase: 62 IU/L (ref 44–121)
BUN/Creatinine Ratio: 12 (ref 12–28)
BUN: 9 mg/dL (ref 8–27)
Bilirubin Total: 0.7 mg/dL (ref 0.0–1.2)
CO2: 23 mmol/L (ref 20–29)
Calcium: 9.2 mg/dL (ref 8.7–10.3)
Chloride: 106 mmol/L (ref 96–106)
Creatinine, Ser: 0.75 mg/dL (ref 0.57–1.00)
Globulin, Total: 3.4 g/dL (ref 1.5–4.5)
Glucose: 163 mg/dL — ABNORMAL HIGH (ref 65–99)
Potassium: 4.6 mmol/L (ref 3.5–5.2)
Sodium: 144 mmol/L (ref 134–144)
Total Protein: 7.1 g/dL (ref 6.0–8.5)
eGFR: 81 mL/min/{1.73_m2} (ref >59)

## 2021-08-19 LAB — LIPID PANEL
Chol/HDL Ratio: 2.5 ratio (ref 0.0–4.4)
Cholesterol, Total: 116 mg/dL (ref 100–199)
HDL: 47 mg/dL (ref >39)
LDL Chol Calc (NIH): 53 mg/dL (ref 0–99)
Triglycerides: 83 mg/dL (ref 0–149)
VLDL Cholesterol Cal: 16 mg/dL (ref 5–40)

## 2021-08-19 MED ORDER — FUROSEMIDE 40 MG PO TABS: 40.0000 mg | ORAL_TABLET | Freq: Every day | ORAL | 0 refills | Status: DC

## 2021-08-19 MED ORDER — POTASSIUM CHLORIDE ER 10 MEQ PO TBCR
10.0000 meq | EXTENDED_RELEASE_TABLET | Freq: Every day | ORAL | 0 refills | Status: DC
Start: 2021-08-19 — End: 2022-02-17

## 2021-08-19 NOTE — Telephone Encounter (Signed)
Patient's daughter calling back. She states they did not receive a call yet. She requests to call the patient's number: [phone]

## 2021-08-19 NOTE — Telephone Encounter (Signed)
Appt made to see Natalie Castro today.  Pt aware.

## 2021-08-19 NOTE — Telephone Encounter (Signed)
Spoke with daughter.  Advised she would get a call to send a remote transmission to see if there was any irregularities that may be causing the SOB.  (PVC's?).  Daughter thanked nurse for call back.

## 2021-08-19 NOTE — Progress Notes (Signed)
HPI Natalie Castro returns today for followup of her PPM, CHB, recent Ecoli sepsis s/p PERC tube placement. Over the past several weeks she has had progressive worsening dyspnea. She was previously pacing only 1-2% but has been pacing in the 90% range. She had an echo a month ago and her EF is still normal. She has had some peripheral edema. No syncope. No angina.  Allergies  Allergen Reactions   Penicillins Hives, Itching and Other (See Comments)    Has tolerated ceftriaxone 7/22 Has patient had a PCN reaction causing immediate rash, facial/tongue/throat swelling, SOB or lightheadedness with hypotension: No Has patient had a PCN reaction causing severe rash involving mucus membranes or skin necrosis: Yes Has patient had a PCN reaction that required hospitalization No Has patient had a PCN reaction occurring within the last 10 years: No If all of the above answers are "NO", then may proceed with Cephalosporin use.      Current Outpatient Medications  Medication Sig Dispense Refill   atorvastatin (LIPITOR) 40 MG tablet TAKE (1) TABLET BY MOUTH AT BEDTIME. 30 tablet 0   ELIQUIS 5 MG TABS tablet TAKE 1 TABLET BY MOUTH TWICE A DAY. 60 tablet 0   furosemide (LASIX) 40 MG tablet Take 1 tablet (40 mg total) by mouth daily. 90 tablet 0   glucose blood test strip Use to check blood glucose fasting , before lunch and dinner and after your largest meal 120 each 12   Lancets (ONETOUCH ULTRASOFT) lancets Use to check glucose 4 x daily 120 each 12   LANTUS SOLOSTAR 100 UNIT/ML Solostar Pen ADMINISTER 30 UNITS UNDER THE SKIN AT BEDTIME 30 mL 1   losartan (COZAAR) 100 MG tablet TAKE (1) TABLET BY MOUTH ONCE DAILY. 30 tablet 0   metFORMIN (GLUCOPHAGE) 1000 MG tablet TAKE (1) TABLET BY MOUTH TWICE DAILY. 60 tablet 2   metoprolol succinate (TOPROL-XL) 50 MG 24 hr tablet Take 50 mg by mouth daily.     nitroGLYCERIN (NITROSTAT) 0.4 MG SL tablet Place 1 tablet (0.4 mg total) under the tongue every 5 (five)  minutes as needed for chest pain. 25 tablet 3   potassium chloride (KLOR-CON) 10 MEQ tablet Take 1 tablet (10 mEq total) by mouth daily. 90 tablet 0   hydrALAZINE (APRESOLINE) 50 MG tablet Take 1 tablet (50 mg total) by mouth 2 (two) times daily. 60 tablet 0   No current facility-administered medications for this visit.     Past Medical History:  Diagnosis Date   Acid reflux    Arthritis    Colon adenomas    AGE 25   Complete heart block (HCC)    STJ PPM Dr. 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, long term anticoag with apixaban    Hyperlipidemia    Myoview 03/2021    Myoview 4/22: EF 50, no infarct or ischemia; low risk   Presence of permanent cardiac pacemaker    Type 2 diabetes mellitus ([place])     ROS:   All systems reviewed and negative except as noted in the HPI.   Past Surgical History:  Procedure Laterality Date   COLONOSCOPY     3 SIMPLE ADENOMAS, AGE 2   COLONOSCOPY N/A 05/24/2018   Procedure: COLONOSCOPY;  Surgeon: Danie Binder, MD;  [facility];  Service: Endoscopy;  Laterality: N/A;  1:00pm   EP IMPLANTABLE DEVICE N/A 11/24/2015   Procedure: Pacemaker Implant;  Surgeon: [name], MD;  Location: [place] [facility];  Service: Cardiovascular;  Laterality: N/A;   IR EXCHANGE BILIARY DRAIN  07/27/2021   IR PERC CHOLECYSTOSTOMY  07/08/2021   JOINT REPLACEMENT     knees bilat.   LEFT HEART CATH AND CORONARY ANGIOGRAPHY N/A 07/02/2021   Procedure: LEFT HEART CATH AND CORONARY ANGIOGRAPHY;  Surgeon: Jettie Booze, MD;  Location: [place] [facility];  Service: Cardiovascular;  Laterality: N/A;   POLYPECTOMY  05/24/2018   Procedure: POLYPECTOMY;  Surgeon: Danie Binder, MD;  [facility];  Service: Endoscopy;;  ascending and hepatic flexure, transverse   TUBAL LIGATION       Family History  Problem Relation Age of Onset   Heart failure Mother    Hypertension Mother    Heart  failure Sister    Hypertension Father    Cancer Sister    Dementia Sister    Colon cancer Neg Hx    Colon polyps Neg Hx      Social History   Socioeconomic History   Marital status: Widowed    Spouse name: Not on file   Number of children: Not on file   Years of education: Not on file   Highest education level: Not on file  Occupational History   Occupation: retired  Tobacco Use   Smoking status: Never   Smokeless tobacco: Never  Vaping Use   Vaping Use: Never used  Substance and Sexual Activity   Alcohol use: No    Alcohol/week: 0.0 standard drinks   Drug use: No   Sexual activity: Yes    Birth control/protection: Post-menopausal  Other Topics Concern   Not on file  Social History Narrative   MARRIED FOR 50 YRS. 5 KIDS: #4 PRESENT TODAY(AGE 34)   Social Determinants of Health   Financial Resource Strain: Not on file  Food Insecurity: Not on file  Transportation Needs: Not on file  Physical Activity: Not on file  Stress: Not on file  Social Connections: Not on file  Intimate Partner Violence: Not on file     BP (!) 196/94   Pulse 88   Ht '5\' 5"'$  (1.651 m)   Wt 209 lb (94.8 kg)   SpO2 91%   BMI 34.78 kg/m   Physical Exam:  Well appearing NAD HEENT: Unremarkable Neck:  No JVD, no thyromegally Lymphatics:  No adenopathy Back:  No CVA tenderness Lungs:  Clear except for basilar rales HEART:  Regular rate rhythm, no murmurs, no rubs, no clicks Abd:  soft, positive bowel sounds, no organomegally, no rebound, no guarding Ext:  2 plus pulses, no edema, no cyanosis, no clubbing Skin:  No rashes no nodules Neuro:  CN II through XII intact, motor grossly intact   DEVICE  Normal device function.  See PaceArt for details.   Assess/Plan:  CHB - she is conducting today. Her PPM has been reprogrammed to allow for intrinsic conduction. Hopefully the conduction will continue.  Chronic diastolic heart failure - her symptoms are class 3. Hopefully allowing her to  conduct will improve her symptoms. I have asked her to start taking lasix. PPM - her St. Jude DDD PM is working normally. There is no indication for biv upgrade at this time.  HTN - her bp is increased though part of this is white coat HTN. I will see her back in 6 weeks. She will have her blood check in 2 weeks. She has been started on laix and potassium.  Carleene Overlie  Uriyah Massimo,MD

## 2021-08-19 NOTE — Telephone Encounter (Signed)
Device transmission received 08/18/21 shows normal function for current programming.  Pt does appear to be VP 100% of the time since early July.  PVC burden <1%, AF burden also <1%.   Recommended to Sonia Baller, RN to have pt seen by GT or Jonni Sanger PA for further eval, programming changes.

## 2021-08-19 NOTE — Patient Instructions (Addendum)
Medication Instructions:  Your physician has recommended you make the following change in your medication:   ** Begin Furosemide '40mg'$  - 1 tablet by mouth daily.  ** Begin Potassium 19mq - 1 tablet by mouth daily ' *If you need a refill on your cardiac medications before your next appointment, please call your pharmacy*   Lab Work:  BMP in 2 weeks - 09/02/2021 - You may come anytime between 8am and 430pm on this day for labs.  You do not have to be fasting.  If you have labs (blood work) drawn today and your tests are completely normal, you will receive your results only by: [place](if you have MyChart) OR A paper copy in the mail If you have any lab test that is abnormal or we need to change your treatment, we will call you to review the results.   Testing/Procedures: None ordered.    Follow-Up: [facility] our [facility].  As part of our continuing mission to provide you with exceptional heart care, we have created designated Provider Care Teams.  These Care Teams include your primary Cardiologist (physician) and Advanced Practice Providers (APPs -  Physician Assistants and Nurse Practitioners) who all work together to provide you with the care you need, when you need it.  We recommend signing up for the patient portal called "MyChart".  Sign up information is provided on this After Visit Summary.  MyChart is used to connect with patients for Virtual Visits (Telemedicine).  Patients are able to view lab/test results, encounter notes, upcoming appointments, etc.  Non-urgent messages can be sent to your provider as well.   To learn more about what you can do with MyChart, go to [email or web address]    Your next appointment:   Follow up with Dr TLovena Lein 6 weeks

## 2021-08-21 ENCOUNTER — Encounter (HOSPITAL_COMMUNITY): Payer: Self-pay | Admitting: Emergency Medicine

## 2021-08-21 ENCOUNTER — Other Ambulatory Visit: Payer: Self-pay

## 2021-08-21 ENCOUNTER — Inpatient Hospital Stay (HOSPITAL_COMMUNITY)
Admission: EM | Admit: 2021-08-21 | Discharge: 2021-08-28 | DRG: 871 | Disposition: A | Discharge status: Home Health | Payer: Medicare Other | Attending: Family Medicine | Admitting: Family Medicine

## 2021-08-21 ENCOUNTER — Emergency Department (HOSPITAL_COMMUNITY): Payer: Medicare Other

## 2021-08-21 DIAGNOSIS — J189 Pneumonia, unspecified organism: Secondary | ICD-10-CM | POA: Diagnosis present

## 2021-08-21 DIAGNOSIS — K219 Gastro-esophageal reflux disease without esophagitis: Secondary | ICD-10-CM | POA: Diagnosis present

## 2021-08-21 DIAGNOSIS — Z86711 Personal history of pulmonary embolism: Secondary | ICD-10-CM

## 2021-08-21 DIAGNOSIS — I509 Heart failure, unspecified: Secondary | ICD-10-CM

## 2021-08-21 DIAGNOSIS — J159 Unspecified bacterial pneumonia: Secondary | ICD-10-CM | POA: Diagnosis present

## 2021-08-21 DIAGNOSIS — E669 Obesity, unspecified: Secondary | ICD-10-CM | POA: Diagnosis present

## 2021-08-21 DIAGNOSIS — I110 Hypertensive heart disease with heart failure: Secondary | ICD-10-CM | POA: Diagnosis present

## 2021-08-21 DIAGNOSIS — E785 Hyperlipidemia, unspecified: Secondary | ICD-10-CM | POA: Diagnosis present

## 2021-08-21 DIAGNOSIS — D649 Anemia, unspecified: Secondary | ICD-10-CM | POA: Diagnosis present

## 2021-08-21 DIAGNOSIS — K810 Acute cholecystitis: Secondary | ICD-10-CM | POA: Diagnosis present

## 2021-08-21 DIAGNOSIS — Z6834 Body mass index (BMI) 34.0-34.9, adult: Secondary | ICD-10-CM

## 2021-08-21 DIAGNOSIS — Z7984 Long term (current) use of oral hypoglycemic drugs: Secondary | ICD-10-CM | POA: Diagnosis not present

## 2021-08-21 DIAGNOSIS — Z79899 Other long term (current) drug therapy: Secondary | ICD-10-CM

## 2021-08-21 DIAGNOSIS — I10 Essential (primary) hypertension: Secondary | ICD-10-CM | POA: Diagnosis not present

## 2021-08-21 DIAGNOSIS — Z7901 Long term (current) use of anticoagulants: Secondary | ICD-10-CM | POA: Diagnosis not present

## 2021-08-21 DIAGNOSIS — E1165 Type 2 diabetes mellitus with hyperglycemia: Secondary | ICD-10-CM | POA: Diagnosis present

## 2021-08-21 DIAGNOSIS — Z880 Allergy status to penicillin: Secondary | ICD-10-CM | POA: Diagnosis not present

## 2021-08-21 DIAGNOSIS — I5033 Acute on chronic diastolic (congestive) heart failure: Secondary | ICD-10-CM | POA: Diagnosis present

## 2021-08-21 DIAGNOSIS — I5032 Chronic diastolic (congestive) heart failure: Secondary | ICD-10-CM | POA: Diagnosis present

## 2021-08-21 DIAGNOSIS — Z20822 Contact with and (suspected) exposure to covid-19: Secondary | ICD-10-CM | POA: Diagnosis present

## 2021-08-21 DIAGNOSIS — I442 Atrioventricular block, complete: Secondary | ICD-10-CM | POA: Diagnosis present

## 2021-08-21 DIAGNOSIS — R0600 Dyspnea, unspecified: Secondary | ICD-10-CM

## 2021-08-21 DIAGNOSIS — R7881 Bacteremia: Secondary | ICD-10-CM | POA: Diagnosis not present

## 2021-08-21 DIAGNOSIS — A4151 Sepsis due to Escherichia coli [E. coli]: Principal | ICD-10-CM | POA: Diagnosis present

## 2021-08-21 DIAGNOSIS — I361 Nonrheumatic tricuspid (valve) insufficiency: Secondary | ICD-10-CM | POA: Diagnosis not present

## 2021-08-21 DIAGNOSIS — Z950 Presence of cardiac pacemaker: Secondary | ICD-10-CM

## 2021-08-21 DIAGNOSIS — Z8249 Family history of ischemic heart disease and other diseases of the circulatory system: Secondary | ICD-10-CM | POA: Diagnosis not present

## 2021-08-21 DIAGNOSIS — J9601 Acute respiratory failure with hypoxia: Secondary | ICD-10-CM | POA: Diagnosis present

## 2021-08-21 DIAGNOSIS — E876 Hypokalemia: Secondary | ICD-10-CM | POA: Diagnosis present

## 2021-08-21 DIAGNOSIS — J9811 Atelectasis: Secondary | ICD-10-CM | POA: Diagnosis present

## 2021-08-21 DIAGNOSIS — I340 Nonrheumatic mitral (valve) insufficiency: Secondary | ICD-10-CM | POA: Diagnosis not present

## 2021-08-21 DIAGNOSIS — Z794 Long term (current) use of insulin: Secondary | ICD-10-CM | POA: Diagnosis not present

## 2021-08-21 LAB — URINALYSIS, ROUTINE W REFLEX MICROSCOPIC
Bilirubin Urine: NEGATIVE
Glucose, UA: NEGATIVE mg/dL
Ketones, ur: 5 mg/dL — AB
Nitrite: NEGATIVE
Protein, ur: NEGATIVE mg/dL
Specific Gravity, Urine: 1.008 (ref 1.005–1.030)
pH: 7 (ref 5.0–8.0)

## 2021-08-21 LAB — CBC WITH DIFFERENTIAL/PLATELET
Abs Immature Granulocytes: 0.04 10*3/uL (ref 0.00–0.07)
Basophils Absolute: 0 10*3/uL (ref 0.0–0.1)
Basophils Relative: 0 %
Eosinophils Absolute: 0 10*3/uL (ref 0.0–0.5)
Eosinophils Relative: 0 %
HCT: 36.3 % (ref 36.0–46.0)
Hemoglobin: 11.3 g/dL — ABNORMAL LOW (ref 12.0–15.0)
Immature Granulocytes: 1 %
Lymphocytes Relative: 12 %
Lymphs Abs: 1 10*3/uL (ref 0.7–4.0)
MCH: 30.6 pg (ref 26.0–34.0)
MCHC: 31.1 g/dL (ref 30.0–36.0)
MCV: 98.4 fL (ref 80.0–100.0)
Monocytes Absolute: 0.6 10*3/uL (ref 0.1–1.0)
Monocytes Relative: 7 %
Neutro Abs: 7.1 10*3/uL (ref 1.7–7.7)
Neutrophils Relative %: 80 %
Platelets: 222 10*3/uL (ref 150–400)
RBC: 3.69 MIL/uL — ABNORMAL LOW (ref 3.87–5.11)
RDW: 16.1 % — ABNORMAL HIGH (ref 11.5–15.5)
WBC: 8.8 10*3/uL (ref 4.0–10.5)
nRBC: 0 % (ref 0.0–0.2)

## 2021-08-21 LAB — COMPREHENSIVE METABOLIC PANEL
ALT: 13 U/L (ref 0–44)
AST: 16 U/L (ref 15–41)
Albumin: 3.4 g/dL — ABNORMAL LOW (ref 3.5–5.0)
Alkaline Phosphatase: 48 U/L (ref 38–126)
Anion gap: 8 (ref 5–15)
BUN: 9 mg/dL (ref 8–23)
CO2: 26 mmol/L (ref 22–32)
Calcium: 8.3 mg/dL — ABNORMAL LOW (ref 8.9–10.3)
Chloride: 105 mmol/L (ref 98–111)
Creatinine, Ser: 0.73 mg/dL (ref 0.44–1.00)
GFR, Estimated: >60 mL/min (ref >60)
Glucose, Bld: 169 mg/dL — ABNORMAL HIGH (ref 70–99)
Potassium: 3.6 mmol/L (ref 3.5–5.1)
Sodium: 139 mmol/L (ref 135–145)
Total Bilirubin: 0.8 mg/dL (ref 0.3–1.2)
Total Protein: 7.6 g/dL (ref 6.5–8.1)

## 2021-08-21 LAB — TROPONIN I (HIGH SENSITIVITY)
Troponin I (High Sensitivity): 22 ng/L — ABNORMAL HIGH (ref <18)
Troponin I (High Sensitivity): 34 ng/L — ABNORMAL HIGH (ref <18)

## 2021-08-21 LAB — RESP PANEL BY RT-PCR (FLU A&B, COVID) ARPGX2
Influenza A by PCR: NEGATIVE
Influenza B by PCR: NEGATIVE
SARS Coronavirus 2 by RT PCR: NEGATIVE

## 2021-08-21 LAB — LACTIC ACID, PLASMA
Lactic Acid, Venous: 1.3 mmol/L (ref 0.5–1.9)
Lactic Acid, Venous: 1.9 mmol/L (ref 0.5–1.9)

## 2021-08-21 LAB — BRAIN NATRIURETIC PEPTIDE: B Natriuretic Peptide: 485 pg/mL — ABNORMAL HIGH (ref 0.0–100.0)

## 2021-08-21 LAB — GLUCOSE, CAPILLARY: Glucose-Capillary: 178 mg/dL — ABNORMAL HIGH (ref 70–99)

## 2021-08-21 LAB — CBG MONITORING, ED: Glucose-Capillary: 163 mg/dL — ABNORMAL HIGH (ref 70–99)

## 2021-08-21 MED ORDER — ALBUTEROL SULFATE (2.5 MG/3ML) 0.083% IN NEBU: 2.5000 mg | INHALATION_SOLUTION | RESPIRATORY_TRACT | Status: DC | PRN

## 2021-08-21 MED ORDER — FUROSEMIDE 10 MG/ML IJ SOLN: 40.0000 mg | Freq: Every day | INTRAMUSCULAR | Status: DC

## 2021-08-21 MED ORDER — FUROSEMIDE 10 MG/ML IJ SOLN
40.0000 mg | Freq: Once | INTRAMUSCULAR | Status: AC
  Administered 2021-08-21: 40 mg via INTRAVENOUS
  Filled 2021-08-21: qty 4

## 2021-08-21 MED ORDER — SODIUM CHLORIDE 0.9% FLUSH
3.0000 mL | Freq: Two times a day (BID) | INTRAVENOUS | Status: DC
  Administered 2021-08-21 – 2021-08-28 (×13): 3 mL via INTRAVENOUS

## 2021-08-21 MED ORDER — PROCHLORPERAZINE 25 MG RE SUPP
25.0000 mg | Freq: Once | RECTAL | Status: DC
  Filled 2021-08-21: qty 1

## 2021-08-21 MED ORDER — POLYETHYLENE GLYCOL 3350 17 G PO PACK: 17.0000 g | PACK | Freq: Every day | ORAL | Status: DC | PRN

## 2021-08-21 MED ORDER — IPRATROPIUM-ALBUTEROL 0.5-2.5 (3) MG/3ML IN SOLN
3.0000 mL | Freq: Three times a day (TID) | RESPIRATORY_TRACT | Status: DC
  Administered 2021-08-21 – 2021-08-22 (×2): 3 mL via RESPIRATORY_TRACT
  Filled 2021-08-21 (×3): qty 3

## 2021-08-21 MED ORDER — ONDANSETRON HCL 4 MG/2ML IJ SOLN
4.0000 mg | Freq: Four times a day (QID) | INTRAMUSCULAR | Status: DC | PRN
  Filled 2021-08-21: qty 2

## 2021-08-21 MED ORDER — METOPROLOL SUCCINATE ER 50 MG PO TB24
50.0000 mg | ORAL_TABLET | Freq: Every day | ORAL | Status: DC
  Administered 2021-08-22 – 2021-08-28 (×7): 50 mg via ORAL
  Filled 2021-08-21 (×8): qty 1

## 2021-08-21 MED ORDER — SODIUM CHLORIDE 0.9 % IV SOLN
500.0000 mg | INTRAVENOUS | Status: DC
  Administered 2021-08-21 – 2021-08-22 (×2): 500 mg via INTRAVENOUS
  Filled 2021-08-21 (×2): qty 500

## 2021-08-21 MED ORDER — LABETALOL HCL 5 MG/ML IV SOLN: 10.0000 mg | INTRAVENOUS | Status: DC | PRN

## 2021-08-21 MED ORDER — LABETALOL HCL 5 MG/ML IV SOLN
10.0000 mg | Freq: Once | INTRAVENOUS | Status: AC
  Administered 2021-08-21: 10 mg via INTRAVENOUS
  Filled 2021-08-21: qty 4

## 2021-08-21 MED ORDER — BISACODYL 10 MG RE SUPP: 10.0000 mg | Freq: Every day | RECTAL | Status: DC | PRN

## 2021-08-21 MED ORDER — ONDANSETRON HCL 4 MG PO TABS: 4.0000 mg | ORAL_TABLET | Freq: Four times a day (QID) | ORAL | Status: DC | PRN

## 2021-08-21 MED ORDER — SODIUM CHLORIDE 0.9 % IV SOLN
250.0000 mL | INTRAVENOUS | Status: DC | PRN
  Administered 2021-08-25: 250 mL via INTRAVENOUS

## 2021-08-21 MED ORDER — SODIUM CHLORIDE 0.9% FLUSH
3.0000 mL | Freq: Two times a day (BID) | INTRAVENOUS | Status: DC
  Administered 2021-08-21 – 2021-08-27 (×8): 3 mL via INTRAVENOUS

## 2021-08-21 MED ORDER — ATORVASTATIN CALCIUM 40 MG PO TABS
40.0000 mg | ORAL_TABLET | Freq: Every day | ORAL | Status: DC
  Filled 2021-08-21: qty 1

## 2021-08-21 MED ORDER — LOSARTAN POTASSIUM 50 MG PO TABS
50.0000 mg | ORAL_TABLET | Freq: Every day | ORAL | Status: DC
  Administered 2021-08-22 – 2021-08-28 (×7): 50 mg via ORAL
  Filled 2021-08-21 (×5): qty 1
  Filled 2021-08-21: qty 2
  Filled 2021-08-21 (×2): qty 1

## 2021-08-21 MED ORDER — INSULIN ASPART 100 UNIT/ML IJ SOLN
0.0000 [IU] | Freq: Every day | INTRAMUSCULAR | Status: DC
  Administered 2021-08-27: 2 [IU] via SUBCUTANEOUS

## 2021-08-21 MED ORDER — INSULIN GLARGINE-YFGN 100 UNIT/ML ~~LOC~~ SOLN
20.0000 [IU] | Freq: Every day | SUBCUTANEOUS | Status: DC
  Administered 2021-08-22 – 2021-08-27 (×6): 20 [IU] via SUBCUTANEOUS
  Filled 2021-08-21 (×9): qty 0.2

## 2021-08-21 MED ORDER — HYDRALAZINE HCL 50 MG PO TABS
50.0000 mg | ORAL_TABLET | Freq: Two times a day (BID) | ORAL | Status: DC
  Administered 2021-08-21 – 2021-08-28 (×14): 50 mg via ORAL
  Filled 2021-08-21 (×8): qty 1
  Filled 2021-08-21: qty 2
  Filled 2021-08-21 (×7): qty 1

## 2021-08-21 MED ORDER — SODIUM CHLORIDE 0.9% FLUSH: 3.0000 mL | INTRAVENOUS | Status: DC | PRN

## 2021-08-21 MED ORDER — INSULIN ASPART 100 UNIT/ML IJ SOLN
0.0000 [IU] | Freq: Three times a day (TID) | INTRAMUSCULAR | Status: DC
  Administered 2021-08-22 – 2021-08-24 (×6): 1 [IU] via SUBCUTANEOUS
  Administered 2021-08-25: 2 [IU] via SUBCUTANEOUS

## 2021-08-21 MED ORDER — SODIUM CHLORIDE 0.9 % IV SOLN
2.0000 g | INTRAVENOUS | Status: DC
  Administered 2021-08-21: 2 g via INTRAVENOUS
  Filled 2021-08-21: qty 20

## 2021-08-21 MED ORDER — ACETAMINOPHEN 650 MG RE SUPP
650.0000 mg | Freq: Four times a day (QID) | RECTAL | Status: DC | PRN
  Administered 2021-08-21: 650 mg via RECTAL
  Filled 2021-08-21: qty 1

## 2021-08-21 MED ORDER — HYDRALAZINE HCL 20 MG/ML IJ SOLN: 10.0000 mg | Freq: Four times a day (QID) | INTRAMUSCULAR | Status: DC | PRN

## 2021-08-21 MED ORDER — GUAIFENESIN ER 600 MG PO TB12
600.0000 mg | ORAL_TABLET | Freq: Two times a day (BID) | ORAL | Status: DC
  Administered 2021-08-21 – 2021-08-28 (×14): 600 mg via ORAL
  Filled 2021-08-21 (×14): qty 1

## 2021-08-21 MED ORDER — APIXABAN 5 MG PO TABS
5.0000 mg | ORAL_TABLET | Freq: Two times a day (BID) | ORAL | Status: DC
  Administered 2021-08-21: 5 mg via ORAL
  Filled 2021-08-21 (×2): qty 1

## 2021-08-21 MED ORDER — ACETAMINOPHEN 325 MG PO TABS
650.0000 mg | ORAL_TABLET | Freq: Four times a day (QID) | ORAL | Status: DC | PRN
  Filled 2021-08-21: qty 2

## 2021-08-21 MED ORDER — POTASSIUM CHLORIDE ER 10 MEQ PO TBCR
10.0000 meq | EXTENDED_RELEASE_TABLET | Freq: Every day | ORAL | Status: DC
  Filled 2021-08-21 (×3): qty 1

## 2021-08-21 MED ORDER — NITROGLYCERIN 0.4 MG SL SUBL: 0.4000 mg | SUBLINGUAL_TABLET | SUBLINGUAL | Status: DC | PRN

## 2021-08-21 MED ORDER — TRAZODONE HCL 50 MG PO TABS: 50.0000 mg | ORAL_TABLET | Freq: Every evening | ORAL | Status: DC | PRN

## 2021-08-21 MED ORDER — POTASSIUM CHLORIDE CRYS ER 10 MEQ PO TBCR
10.0000 meq | EXTENDED_RELEASE_TABLET | Freq: Every day | ORAL | Status: DC
  Administered 2021-08-22 – 2021-08-28 (×7): 10 meq via ORAL
  Filled 2021-08-21 (×8): qty 1

## 2021-08-21 MED ORDER — FUROSEMIDE 10 MG/ML IJ SOLN
20.0000 mg | Freq: Once | INTRAMUSCULAR | Status: AC
  Administered 2021-08-21: 20 mg via INTRAVENOUS
  Filled 2021-08-21: qty 2

## 2021-08-21 NOTE — Plan of Care (Signed)

## 2021-08-21 NOTE — ED Triage Notes (Signed)
Patient c/o shortness of breath "for a while"-unable to give time frame. Patient states seen at Dr Lovena Le, cardiologist, on Thursday and pacemaker was checked. Patient states has had increase in fluid on legs and was placed on diuretic in which patient states is working. Per patient shortness of breath seemed to improve till this morning but now she feels more short of breath and has generalized weakness. Patient noted to have temp of 100.7 in triage.

## 2021-08-21 NOTE — ED Provider Notes (Addendum)
[facility] EMERGENCY DEPARTMENT Provider Note   CSN: [id number] Arrival date & time: 08/21/21  1152     History Chief Complaint  Patient presents with   Shortness of Breath    Natalie Castro is a 80 y.o. female with history of third-degree heart block status pos pacemaker, diabetes, pulmonary embolism, and CHF who presents to the emergency department today for worsening shortness of breath of the last few days.  She states that she has been short of breath for some time but has been progressively worse over the last few days.  He was seen and evaluated by her cardiologist and had her pacemaker interrogated.  Everything checked out per chart review.  Also placed on lasix for leg swelling.  She is still complaining of leg swelling bilaterally.  He denies any chest pain, fever, chills, sore throat, abdominal pain, nausea, vomiting, diarrhea, urinary complaints, and bowel changes. No leg pain.   Shortness of Breath Associated symptoms: chest pain   Associated symptoms: no abdominal pain, no fever, no sore throat and no vomiting       Past Medical History:  Diagnosis Date   Acid reflux    Arthritis    Colon adenomas    AGE 46   Complete heart block (HCC)    STJ PPM Dr. 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/04/2016   Elevated troponin I level 11/04/2016   Complete heart block (HCC)/[employer] pacemaker 11/23/2015   KNEE, ARTHRITIS, 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   KNEE PAIN 07/20/2009   Essential hypertension, benign 07/20/2009    Past Surgical History:  Procedure Laterality Date   COLONOSCOPY     3 SIMPLE ADENOMAS, AGE 46   COLONOSCOPY N/A 05/24/2018   Procedure: COLONOSCOPY;  Surgeon: Danie Binder, MD;  [facility];  Service: Endoscopy;  Laterality: N/A;  1:00pm   EP IMPLANTABLE DEVICE N/A 11/24/2015   Procedure: Pacemaker Implant;  Surgeon: [name], MD;  Location: [place] CV LAB;  Service: Cardiovascular;  Laterality: N/A;   IR EXCHANGE BILIARY DRAIN  07/27/2021   IR PERC CHOLECYSTOSTOMY  07/08/2021   JOINT REPLACEMENT     knees bilat.   LEFT HEART CATH AND CORONARY ANGIOGRAPHY N/A 07/02/2021   Procedure: LEFT HEART CATH AND CORONARY ANGIOGRAPHY;  Surgeon: Irish Lack,  Charlann Lange, MD;  Location: [place] CV LAB;  Service: Cardiovascular;  Laterality: N/A;   POLYPECTOMY  05/24/2018   Procedure: POLYPECTOMY;  Surgeon: Danie Binder, MD;  Location: [facility];  Service: Endoscopy;;  ascending and hepatic flexure, transverse   TUBAL LIGATION       OB History     Gravida  5   Para  5   Term  5   Preterm      AB      Living  4      SAB      IAB      Ectopic      Multiple      Live Births              Family History  Problem Relation Age  of Onset   Heart failure Mother    Hypertension Mother    Heart failure Sister    Hypertension Father    Cancer Sister    Dementia Sister    Colon cancer Neg Hx    Colon polyps Neg Hx     Social History   Tobacco Use   Smoking status: Never   Smokeless tobacco: Never  Vaping Use   Vaping Use: Never used  Substance Use Topics   Alcohol use: No    Alcohol/week: 0.0 standard drinks   Drug use: No    Home Medications Prior to Admission medications   Medication Sig Start Date End Date Taking? Authorizing Provider  atorvastatin (LIPITOR) 40 MG tablet TAKE (1) TABLET BY MOUTH AT BEDTIME. Patient taking differently: Take 40 mg by mouth at bedtime. 05/28/20   Corum, Rex Kras, MD  ELIQUIS 5 MG TABS tablet TAKE 1 TABLET BY MOUTH TWICE A DAY. 05/05/20   Corum, Rex Kras, MD  furosemide (LASIX) 40 MG tablet Take 1 tablet (40 mg total) by mouth daily. 08/19/21   Evans Lance, MD  glucose blood test strip Use to check blood glucose fasting , before lunch and dinner and after your largest meal 11/04/19   Corum, Rex Kras, MD  hydrALAZINE (APRESOLINE) 50 MG tablet Take 1 tablet (50 mg total) by mouth 2 (two) times daily. 07/11/21 08/10/21  Elodia Florence., MD  Lancets [facility] ULTRASOFT) lancets Use to check glucose 4 x daily 11/04/19   Corum, Rex Kras, MD  LANTUS SOLOSTAR 100 UNIT/ML Solostar Pen ADMINISTER 30 UNITS UNDER THE SKIN AT BEDTIME 08/06/21   Cassandria Anger, MD  losartan (COZAAR) 100 MG tablet TAKE (1) TABLET BY MOUTH ONCE DAILY. Patient taking differently: Take 100 mg by mouth daily. 05/28/20   Corum, Rex Kras, MD  metFORMIN (GLUCOPHAGE) 1000 MG tablet TAKE (1) TABLET BY MOUTH TWICE DAILY. 05/17/21   Cassandria Anger, MD  metoprolol succinate (TOPROL-XL) 50 MG 24 hr tablet Take 50 mg by mouth daily. 08/13/21   [provider]  nitroGLYCERIN (NITROSTAT) 0.4 MG SL tablet Place 1 tablet (0.4 mg total) under the tongue every 5 (five) minutes as needed for chest pain. 02/24/21    Richardson Dopp T, PA-C  potassium chloride (KLOR-CON) 10 MEQ tablet Take 1 tablet (10 mEq total) by mouth daily. 08/19/21   Evans Lance, MD    Allergies    Penicillins  Review of Systems   Review of Systems  Constitutional:  Negative for chills and fever.  HENT:  Negative for sore throat.   Respiratory:  Positive for shortness of breath.   Cardiovascular:  Positive for chest  pain and leg swelling.  Gastrointestinal:  Negative for abdominal pain, constipation, diarrhea, nausea and vomiting.  Genitourinary:  Negative for difficulty urinating and hematuria.  All other systems reviewed and are negative.  Physical Exam Updated Vital Signs BP (!) 186/90 (BP Location: Left Arm)   Pulse 96   Temp 100.3 F (37.9 C) (Oral)   Resp (!) 33   Ht '5\' 5"'$  (1.651 m)   Wt 93.9 kg   SpO2 97%   BMI 34.45 kg/m   Physical Exam Vitals reviewed.  Constitutional:      Appearance: She is well-developed.  HENT:     Head: Normocephalic and atraumatic.  Eyes:     Conjunctiva/sclera: Conjunctivae normal.  Cardiovascular:     Rate and Rhythm: Normal rate and regular rhythm.     Pulses:          Radial pulses are 2+ on the right side and 2+ on the left side.       Dorsalis pedis pulses are 2+ on the right side and 2+ on the left side.     Heart sounds: Normal heart sounds.  Pulmonary:     Effort: Tachypnea present.     Breath sounds: No decreased breath sounds, wheezing, rhonchi or rales.  Abdominal:     General: Bowel sounds are normal.     Palpations: Abdomen is soft.     Tenderness: There is no abdominal tenderness.  Musculoskeletal:     Cervical back: Neck supple.     Right lower leg: 1+ Edema present.     Left lower leg: 1+ Edema present.  Skin:    General: Skin is warm and dry.  Neurological:     Mental Status: She is alert.  Psychiatric:        Mood and Affect: Mood and affect normal.    ED Results / Procedures / Treatments   Labs (all labs ordered are listed, but only  abnormal results are displayed) Labs Reviewed  BRAIN NATRIURETIC PEPTIDE - Abnormal; Notable for the following components:      Result Value   B Natriuretic Peptide 485.0 (*)    All other components within normal limits  CBC WITH DIFFERENTIAL/PLATELET - Abnormal; Notable for the following components:   RBC 3.69 (*)    Hemoglobin 11.3 (*)    RDW 16.1 (*)    All other components within normal limits  COMPREHENSIVE METABOLIC PANEL - Abnormal; Notable for the following components:   Glucose, Bld 169 (*)    Calcium 8.3 (*)    Albumin 3.4 (*)    All other components within normal limits  TROPONIN I (HIGH SENSITIVITY) - Abnormal; Notable for the following components:   Troponin I (High Sensitivity) 22 (*)    All other components within normal limits  RESP PANEL BY RT-PCR (FLU A&B, COVID) ARPGX2  CULTURE, BLOOD (ROUTINE X 2)  CULTURE, BLOOD (ROUTINE X 2)  LACTIC ACID, PLASMA  LACTIC ACID, PLASMA  TROPONIN I (HIGH SENSITIVITY)    EKG EKG Interpretation  Date/Time:  Saturday August 21 2021 12:30:17 EDT Ventricular Rate:  85 PR Interval:  172 QRS Duration: 144 QT Interval:  422 QTC Calculation: 502 R Axis:   141 Text Interpretation: Normal sinus rhythm Right axis deviation Non-specific intra-ventricular conduction block Cannot rule out Septal infarct , age undetermined T wave abnormality, consider inferior ischemia T wave abnormality, consider anterolateral ischemia Abnormal ECG Similar to Mar 2022 tracing Confirmed by Nanda Quinton [phone]) on 08/21/2021 12:36:21 PM  Radiology DG Chest  Port 1 View  Result Date: 08/21/2021 CLINICAL DATA:  Shortness of breath EXAM: PORTABLE CHEST 1 VIEW COMPARISON:  Chest x-rays dated 07/04/2021 and 07/01/2021. FINDINGS: Ill-defined opacity at the LEFT lung base. Probable small bilateral pleural effusions. No pneumothorax is seen. Stable cardiomegaly. LEFT chest wall pacemaker/ICD apparatus appears stable in position. Osseous structures about the chest are  unremarkable. IMPRESSION: 1. Ill-defined opacity at the LEFT lung base, pneumonia versus atelectasis. 2. Probable small bilateral pleural effusions. 3. Stable cardiomegaly. Electronically Signed   By: Franki Cabot M.D.   On: 08/21/2021 13:38    Procedures .Critical Care  Date/Time: 08/21/2021 2:43 PM Performed by: Hendricks Limes, PA-C Authorized by: Hendricks Limes, PA-C   Critical care provider statement:    Critical care time (minutes):  45   Critical care time was exclusive of:  Separately billable procedures and treating other patients   Critical care was necessary to treat or prevent imminent or life-threatening deterioration of the following conditions:  Respiratory failure   Critical care was time spent personally by me on the following activities:  Discussions with consultants, evaluation of patient's response to treatment, examination of patient, ordering and performing treatments and interventions, ordering and review of laboratory studies, ordering and review of radiographic studies, pulse oximetry, re-evaluation of patient's condition, obtaining history from patient or surrogate and review of old charts   I assumed direction of critical care for this patient from another provider in my specialty: no     Care discussed with: admitting provider     Medications Ordered in ED Medications  cefTRIAXone (ROCEPHIN) 2 g in sodium chloride 0.9 % 100 mL IVPB (2 g Intravenous New Bag/Given 08/21/21 1508)  azithromycin (ZITHROMAX) 500 mg in sodium chloride 0.9 % 250 mL IVPB (has no administration in time range)  apixaban (ELIQUIS) tablet 5 mg (has no administration in time range)  atorvastatin (LIPITOR) tablet 40 mg (has no administration in time range)  losartan (COZAAR) tablet 50 mg (has no administration in time range)  nitroGLYCERIN (NITROSTAT) SL tablet 0.4 mg (has no administration in time range)  insulin glargine-yfgn (SEMGLEE) injection 20 Units (has no administration in time  range)  metoprolol succinate (TOPROL-XL) 24 hr tablet 50 mg (has no administration in time range)  hydrALAZINE (APRESOLINE) tablet 50 mg (has no administration in time range)  sodium chloride flush (NS) 0.9 % injection 3 mL (has no administration in time range)  sodium chloride flush (NS) 0.9 % injection 3 mL (has no administration in time range)  sodium chloride flush (NS) 0.9 % injection 3 mL (has no administration in time range)  0.9 %  sodium chloride infusion (has no administration in time range)  acetaminophen (TYLENOL) tablet 650 mg (has no administration in time range)    Or  acetaminophen (TYLENOL) suppository 650 mg (has no administration in time range)  traZODone (DESYREL) tablet 50 mg (has no administration in time range)  polyethylene glycol (MIRALAX / GLYCOLAX) packet 17 g (has no administration in time range)  bisacodyl (DULCOLAX) suppository 10 mg (has no administration in time range)  ondansetron (ZOFRAN) tablet 4 mg (has no administration in time range)    Or  ondansetron (ZOFRAN) injection 4 mg (has no administration in time range)  insulin aspart (novoLOG) injection 0-6 Units (has no administration in time range)  insulin aspart (novoLOG) injection 0-5 Units (has no administration in time range)  ipratropium-albuterol (DUONEB) 0.5-2.5 (3) MG/3ML nebulizer solution 3 mL (has no administration in time range)  albuterol (PROVENTIL) (  2.5 MG/3ML) 0.083% nebulizer solution 2.5 mg (has no administration in time range)  guaiFENesin (MUCINEX) 12 hr tablet 600 mg (has no administration in time range)  potassium chloride SA (KLOR-CON) CR tablet 10 mEq (has no administration in time range)  hydrALAZINE (APRESOLINE) injection 10 mg (has no administration in time range)  furosemide (LASIX) injection 20 mg (has no administration in time range)  furosemide (LASIX) injection 40 mg (has no administration in time range)  furosemide (LASIX) injection 40 mg (40 mg Intravenous Given 08/21/21  1508)    ED Course  I have reviewed the triage vital signs and the nursing notes.  Pertinent labs & imaging results that were available during my care of the patient were reviewed by me and considered in my medical decision making (see chart for details).    MDM Rules/Calculators/A&P                          Chavela Crincoli is a 80 y.o female presents emergency department for further evaluation of shortness of breath over the last couple of days.  Clinically she appears slightly volume overloaded.  She was febrile on arrival.  No obvious signs of valvular pathology on physical exam.  Lungs did not sound wet clinically. She was hypoxic on initial presentation. This improved with 2L of oxygen nasal canula.   BNP is 485. This supports volume overload. CBC without evidence of leukocytosis. CMP is normal. Blood cultures pending. Imaging revealed possible left lobe pneumonia and bilateral pleural effusions. Again, this supports volume overload. Second Troponin is still pending. COVID was negative. Initial lactic is normal.   Clinically she is improved. She does not appear toxic or septic. I suspect she is volume overloaded with an overlying pneumonia. Will treat for underlying pneumonia with ceftriaxone and azithromycin. Will give a dose of Lasix for volume overload.  Given the clinical scenario she would benefit her admission to the hospital for close management. She will be admitted to the hospitalist service. Patient is aware. All questions and concerns addressed.    Final Clinical Impression(s) / ED Diagnoses Final diagnoses:  Acute respiratory failure with hypoxia [facility])    Rx / DC Orders ED Discharge Orders     None        Hendricks Limes, PA-C 08/21/21 1518    Myna Bright Twilight, PA-C 08/21/21 Towanda Octave, MD 08/22/21 [phone]

## 2021-08-21 NOTE — H&P (Signed)
Patient Demographics:    Natalie Castro, is a 80 y.o. 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 Barry Brunner presenting with CHF exacerbation --Please get EP cardiology input-on a non-urgent basis -Elevated BNP and clinical signs of CHF exacerbation noted -IV Lasix as ordered Fluid input and output monitoring and daily weight  3)Sepsis secondary to CAP---POA -possible pneumonia on the left--- repeat chest x-ray in a.m. 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 coli bacteremia and acute cholecystitis-- right upper quadrant Percutaneous cholecystectomy tube in Situ--- continues to have drainage in the bag Please get IR input on management of right flank cholecystectomy tube-on a non-urgent basis  6)HTN--elevated BP noted, okay to restart metoprolol, losartan, and hydralazine  ---okay to use IV h labetalol as needed elevated BP  7)DM2-A1c was 7.4 in July--- reflecting uncontrolled diabetes with hyperglycemia -Lantus 20 units nightly Use Novolog/Humalog Sliding scale insulin with Accu-Cheks/Fingersticks as ordered   8)Hx complete heart block s/p Saint Jude PPM--Her PPM has been reprogrammed to allow for intrinsic conduction on 08/19/21 by EP cardiologist Dr. Barry Brunner presenting with CHF exacerbation --Please get EP cardiology input-on a non-urgent basis  9) chronic normocytic and normochromic anemia--- hemoglobin currently greater than 11 which is slightly higher than patient's recent baseline -No evidence of ongoing bleeding continue to monitor closely  Disposition/Need for in-Hospital Stay- patient unable to be discharged at this time due to acute hypoxic respiratory failure due to combination of dCHF exacerbation and left-sided pneumonia-requiring IV diuresis and IV antibiotics and possible pacemaker recheck by EP cardiology  Status is: Inpatient  Remains inpatient appropriate because: Please see disposition above  Dispo: The patient is from:  Home              Anticipated d/c is to: Home              Anticipated d/c date is: > 3 days              Patient currently is not medically stable to d/c. Barriers: Not Clinically Stable-    With History of - Reviewed by me  Past Medical History:  Diagnosis Date   Acid reflux    Arthritis    Colon adenomas    AGE 89   Complete heart block (HCC)    STJ PPM Dr. [name] 11/24/15    Essential hypertension    History of pulmonary embolism 2017   unprovoked, long term anticoag with apixaban    Hyperlipidemia    Myoview 03/2021    Myoview 4/22: EF 50, no infarct or ischemia; low risk   Presence of permanent cardiac pacemaker    Type 2 diabetes mellitus ([place])       Past Surgical History:  Procedure Laterality Date   COLONOSCOPY     3 SIMPLE ADENOMAS, AGE 89   COLONOSCOPY N/A 05/24/2018   Procedure: COLONOSCOPY;  Surgeon: Danie Binder, MD;  Location: [facility];  Service: Endoscopy;  Laterality: N/A;  1:00pm   EP IMPLANTABLE DEVICE N/A 11/24/2015   Procedure: Pacemaker Implant;  Surgeon: [name], MD;  Location: [facility];  Service: Cardiovascular;  Laterality: N/A;   IR EXCHANGE BILIARY DRAIN  07/27/2021   IR PERC CHOLECYSTOSTOMY  07/08/2021   JOINT REPLACEMENT     knees bilat.   LEFT HEART CATH AND CORONARY ANGIOGRAPHY N/A 07/02/2021   Procedure: LEFT HEART CATH AND CORONARY ANGIOGRAPHY;  Surgeon: Jettie Booze, MD;  Location: [facility];  Service: Cardiovascular;  Laterality: N/A;   POLYPECTOMY  05/24/2018   Procedure: POLYPECTOMY;  Surgeon: Danie Binder, MD;  Location: [facility];  Service: Endoscopy;;  ascending and hepatic flexure, transverse   TUBAL LIGATION        Chief Complaint  Patient presents with   Shortness of Breath      HPI:    Natalie Castro  is a 80 y.o. female  with past medical history significant for complete heart block s/p Saint Jude PPM, HTN, HLD, DM2, history of PE on chronic anticoagulation, chronic diastolic congestive heart failure who presents to the ED with fevers, shortness of breath and cough and is found to be hypoxic with O2 sats of 83-85 % on room air -Her PPM has been reprogrammed to allow for intrinsic conduction on 08/19/21 by EP cardiologist Dr. Barry Brunner presenting with CHF exacerbation -Patient with fevers up to 100.7, tachypnea and tachycardia -Chest x-ray with -possible pneumonia on  the left---  -Additional history obtained from patient's daughter Ms. [name] Jumper at bedside -Patient also had nausea and vomiting,, no diarrhea and no abdominal pain -BNP is 485, troponin is 22 -WBC is 8.8 -COVID-negative and lactic acid is not elevated  Review of systems:    In addition to the HPI above,   A full Review of  Systems was done, all other systems reviewed are negative except as noted above in HPI , .    Social History:  Reviewed by me    Social History   Tobacco Use   Smoking status: Never   Smokeless tobacco: Never  Substance Use Topics   Alcohol use: No    Alcohol/week: 0.0 standard drinks  Family History :  Reviewed by me    Family History  Problem Relation Age of Onset   Heart failure Mother    Hypertension Mother    Heart failure Sister    Hypertension Father    Cancer Sister    Dementia Sister    Colon cancer Neg Hx    Colon polyps Neg Hx      Home Medications:   Prior to Admission medications   Medication Sig Start Date End Date Taking? Authorizing Provider  atorvastatin (LIPITOR) 40 MG tablet TAKE (1) TABLET BY MOUTH AT BEDTIME. 05/28/20   Corum, Minerva Fester, MD  ELIQUIS 5 MG TABS tablet TAKE 1 TABLET BY MOUTH TWICE A DAY. 05/05/20   Corum, Minerva Fester, MD  furosemide (LASIX) 40 MG tablet Take 1 tablet (40 mg total) by mouth daily. 08/19/21   Marinus Maw, MD  glucose blood test strip Use to check blood glucose fasting , before lunch and dinner and after your largest meal 11/04/19   Corum, Minerva Fester, MD  hydrALAZINE (APRESOLINE) 50 MG tablet Take 1 tablet (50 mg total) by mouth 2 (two) times daily. 07/11/21 08/10/21  Zigmund Daniel., MD  Lancets [facility] ULTRASOFT) lancets Use to check glucose 4 x daily 11/04/19   Corum, Minerva Fester, MD  LANTUS SOLOSTAR 100 UNIT/ML Solostar Pen ADMINISTER 30 UNITS UNDER THE SKIN AT BEDTIME 08/06/21   Roma Kayser, MD  losartan (COZAAR) 100 MG tablet TAKE (1) TABLET BY MOUTH ONCE DAILY. 05/28/20   Corum, Minerva Fester, MD  metFORMIN (GLUCOPHAGE) 1000 MG tablet TAKE (1) TABLET BY MOUTH TWICE DAILY. 05/17/21   Roma Kayser, MD  metoprolol succinate (TOPROL-XL) 50 MG 24 hr tablet Take 50 mg by mouth daily. 08/13/21   [provider]  nitroGLYCERIN (NITROSTAT) 0.4 MG SL tablet Place 1 tablet (0.4 mg total) under the tongue every 5 (five) minutes as needed for chest pain. 02/24/21   Tereso Newcomer T, PA-C  potassium chloride (KLOR-CON) 10 MEQ tablet Take 1 tablet (10 mEq total) by mouth daily. 08/19/21   Marinus Maw, MD     Allergies:     Allergies  Allergen Reactions   Penicillins Hives, Itching and Other (See Comments)    Has tolerated ceftriaxone 7/22 Has patient had a PCN reaction causing immediate rash, facial/tongue/throat swelling, SOB or lightheadedness with hypotension: No Has patient had a PCN reaction causing severe rash involving mucus membranes or skin necrosis: Yes Has patient had a PCN reaction that required hospitalization No Has patient had a PCN reaction occurring within the last 10 years: No If all of the above answers are "NO", then may proceed with Cephalosporin use.      Physical Exam:   Vitals  Blood pressure (!) 186/90, pulse 96, temperature 100.3 F (37.9 C), temperature source Oral, resp. rate (!) 33, height 5\' 5"  (1.651 m), weight 93.9 kg, SpO2 97 %.  Physical Examination: General appearance - alert, chronically ill appearing, and in no distress  Mental status - alert, oriented to person, place, and time,  Eyes - sclera anicteric Nose- [place] 3L/min Neck - supple, no JVD elevation , Chest -respirations on the left with few scattered rhonchi Heart - S1,S2 normal, regular , left subclavian area with pacemaker in situ Abdomen - soft, nontender, nondistended, right upper quadrant with cholecystectomy tube with contents in the bag  neurological - screening mental status exam normal, neck supple without rigidity, cranial nerves II through XII intact, DTR's normal  and symmetric Extremities - no pedal edema noted, intact peripheral pulses  Skin - warm, dry     Data Review:    CBC Recent Labs  Lab 08/21/21 1336  WBC 8.8  HGB 11.3*  HCT 36.3  PLT 222  MCV 98.4  MCH 30.6  MCHC 31.1  RDW 16.1*  LYMPHSABS 1.0  MONOABS 0.6  EOSABS 0.0  BASOSABS 0.0    Chemistries  Recent Labs  Lab 08/18/21 0844 08/21/21 1336  NA 144 139  K 4.6 3.6  CL 106 105  CO2 23 26  GLUCOSE 163* 169*  BUN 9 9  CREATININE 0.75 0.73  CALCIUM 9.2 8.3*  AST 16 16  ALT 9 13  ALKPHOS 62 48  BILITOT 0.7 0.8   ------------------------------------------------------------------------------------------------------------------ estimated creatinine clearance is 64.6 mL/min (by C-G formula based on SCr of 0.73 mg/dL). ------------------------------------------------------------------------------------------------------------------ No results for input(s): TSH, T4TOTAL, T3FREE, THYROIDAB in the last 72 hours.  Invalid input(s): FREET3   Coagulation profile No results for input(s): INR, PROTIME in the last 168 hours. ------------------------------------------------------------------------------------------------------------------- No results for input(s): DDIMER in the last 72 hours. -------------------------------------------------------------------------------------------------------------------  Cardiac Enzymes No results for input(s): CKMB, TROPONINI, MYOGLOBIN in the last 168 hours.  Invalid input(s): CK ------------------------------------------------------------------------------------------------------------------    Component Value Date/Time   BNP 485.0 (H) 08/21/2021 1336    Urinalysis    Component Value Date/Time   COLORURINE AMBER (A) 07/05/2021 1428   APPEARANCEUR HAZY (A) 07/05/2021 1428   LABSPEC 1.025 07/05/2021 1428   PHURINE 5.0 07/05/2021 1428   GLUCOSEU 150 (A) 07/05/2021 1428   HGBUR SMALL (A) 07/05/2021 1428   BILIRUBINUR  NEGATIVE 07/05/2021 1428   KETONESUR NEGATIVE 07/05/2021 1428   PROTEINUR 30 (A) 07/05/2021 1428   UROBILINOGEN 0.2 06/18/2015 2300   NITRITE NEGATIVE 07/05/2021 1428   LEUKOCYTESUR NEGATIVE 07/05/2021 1428    ----------------------------------------------------------------------------------------------------------------   Imaging Results:    DG Chest Port 1 View  Result Date: 08/21/2021 CLINICAL DATA:  Shortness of breath EXAM: PORTABLE CHEST 1 VIEW COMPARISON:  Chest x-rays dated 07/04/2021 and 07/01/2021. FINDINGS: Ill-defined opacity at the LEFT lung base. Probable small bilateral pleural effusions. No pneumothorax is seen. Stable cardiomegaly. LEFT chest wall pacemaker/ICD apparatus appears stable in position. Osseous structures about the chest are unremarkable. IMPRESSION: 1. Ill-defined opacity at the LEFT lung base, pneumonia versus atelectasis. 2. Probable small bilateral pleural effusions. 3. Stable cardiomegaly. Electronically Signed   By: Bary Richard M.D.   On: 08/21/2021 13:38    Radiological Exams on Admission: DG Chest Port 1 View  Result Date: 08/21/2021 CLINICAL DATA:  Shortness of breath EXAM: PORTABLE CHEST 1 VIEW COMPARISON:  Chest x-rays dated 07/04/2021 and 07/01/2021. FINDINGS: Ill-defined opacity at the LEFT lung base. Probable small bilateral pleural effusions. No pneumothorax is seen. Stable cardiomegaly. LEFT chest wall pacemaker/ICD apparatus appears stable in position. Osseous structures about the chest are unremarkable. IMPRESSION: 1. Ill-defined opacity at the LEFT lung base, pneumonia versus atelectasis. 2. Probable small bilateral pleural effusions. 3. Stable cardiomegaly. Electronically Signed   By: Bary Richard M.D.   On: 08/21/2021 13:38    DVT Prophylaxis -SCD /Apixaban AM Labs Ordered, also please review Full Orders  Family Communication: Admission, patients condition and plan of care including tests being ordered have been discussed with the patient  and daughter Ms Preston Fleeting who indicate understanding and agree with the plan   Code Status - Full Code  Likely DC to  home after resolution of sepsis pathophysiology  Condition   stable  Roxan Hockey M.D on 08/21/2021 at 3:02 PM Go to [email or web address] -  for contact info  [facility] - Office  [phone]

## 2021-08-21 NOTE — ED Notes (Signed)
I give pt po medications.  pt vomited less than a min after taking medications.  pt o2 sats decreased to 85% after vomiting.  I increased o2 to 4lpm.  o2 sats 90-92 at present.  called rt to give scheduled duo neb.  pt sounds like she is wheezing.   Im going to hold po meds for now.  I did given zofran.  Messaged md for futher orders and recommendations.

## 2021-08-22 ENCOUNTER — Inpatient Hospital Stay (HOSPITAL_COMMUNITY): Payer: Medicare Other

## 2021-08-22 DIAGNOSIS — J9601 Acute respiratory failure with hypoxia: Secondary | ICD-10-CM | POA: Diagnosis not present

## 2021-08-22 LAB — BLOOD CULTURE ID PANEL (REFLEXED) - BCID2

## 2021-08-22 LAB — MAGNESIUM: Magnesium: 1.4 mg/dL — ABNORMAL LOW (ref 1.7–2.4)

## 2021-08-22 LAB — BRAIN NATRIURETIC PEPTIDE: B Natriuretic Peptide: 322.9 pg/mL — ABNORMAL HIGH (ref 0.0–100.0)

## 2021-08-22 LAB — RENAL FUNCTION PANEL
Albumin: 2.6 g/dL — ABNORMAL LOW (ref 3.5–5.0)
Anion gap: 6 (ref 5–15)
BUN: 9 mg/dL (ref 8–23)
CO2: 29 mmol/L (ref 22–32)
Calcium: 8.2 mg/dL — ABNORMAL LOW (ref 8.9–10.3)
Chloride: 104 mmol/L (ref 98–111)
Creatinine, Ser: 0.81 mg/dL (ref 0.44–1.00)
GFR, Estimated: >60 mL/min (ref >60)
Glucose, Bld: 154 mg/dL — ABNORMAL HIGH (ref 70–99)
Phosphorus: 4.2 mg/dL (ref 2.5–4.6)
Potassium: 3.2 mmol/L — ABNORMAL LOW (ref 3.5–5.1)
Sodium: 139 mmol/L (ref 135–145)

## 2021-08-22 LAB — CBC
HCT: 31.1 % — ABNORMAL LOW (ref 36.0–46.0)
Hemoglobin: 9.5 g/dL — ABNORMAL LOW (ref 12.0–15.0)
MCH: 29.6 pg (ref 26.0–34.0)
MCHC: 30.5 g/dL (ref 30.0–36.0)
MCV: 96.9 fL (ref 80.0–100.0)
Platelets: 176 10*3/uL (ref 150–400)
RBC: 3.21 MIL/uL — ABNORMAL LOW (ref 3.87–5.11)
RDW: 16.4 % — ABNORMAL HIGH (ref 11.5–15.5)
WBC: 7.5 10*3/uL (ref 4.0–10.5)
nRBC: 0 % (ref 0.0–0.2)

## 2021-08-22 LAB — GLUCOSE, CAPILLARY
Glucose-Capillary: 144 mg/dL — ABNORMAL HIGH (ref 70–99)
Glucose-Capillary: 176 mg/dL — ABNORMAL HIGH (ref 70–99)
Glucose-Capillary: 172 mg/dL — ABNORMAL HIGH (ref 70–99)
Glucose-Capillary: 166 mg/dL — ABNORMAL HIGH (ref 70–99)

## 2021-08-22 MED ORDER — IPRATROPIUM-ALBUTEROL 0.5-2.5 (3) MG/3ML IN SOLN
3.0000 mL | Freq: Two times a day (BID) | RESPIRATORY_TRACT | Status: DC
  Administered 2021-08-22 – 2021-08-23 (×2): 3 mL via RESPIRATORY_TRACT
  Filled 2021-08-22 (×2): qty 3

## 2021-08-22 MED ORDER — IOHEXOL 9 MG/ML PO SOLN
ORAL | Status: AC
  Administered 2021-08-22: 1000 mL
  Filled 2021-08-22: qty 1000

## 2021-08-22 MED ORDER — APIXABAN 5 MG PO TABS
5.0000 mg | ORAL_TABLET | Freq: Two times a day (BID) | ORAL | Status: DC
  Administered 2021-08-22 – 2021-08-28 (×12): 5 mg via ORAL
  Filled 2021-08-22 (×12): qty 1

## 2021-08-22 MED ORDER — IOHEXOL 350 MG/ML SOLN
75.0000 mL | Freq: Once | INTRAVENOUS | Status: AC | PRN
  Administered 2021-08-22: 75 mL via INTRAVENOUS

## 2021-08-22 MED ORDER — SODIUM CHLORIDE 0.9 % IV SOLN
2.0000 g | INTRAVENOUS | Status: DC
  Administered 2021-08-22 – 2021-08-23 (×2): 2 g via INTRAVENOUS
  Filled 2021-08-22 (×2): qty 20

## 2021-08-22 NOTE — Progress Notes (Signed)
Natalie Castro  QMG:[id number] DOB: 09-Dec-1941 DOA: 08/21/2021 PCP: [name], MD    Brief Narrative:  80 year old with a history of complete heart block status post pacemaker placement, HTN, HLD, DM2, PE on chronic anticoagulation, and chronic diastolic CHF who presented to the ED with complaints of fever shortness of breath and cough.  In the ED she was found to be hypoxic at 83% on room air.  CXR noted possible infiltrate of the left lower lobe.  Significant Events:  7/7 > 7/17 admit with CP/SOB - found to have E coli bacteremia and acute cholecystitis 7/10 blood cx - E coli 1 of 2  7/12 blood cx no growth 8/27 admit via ER  Consultants:  None  Code Status: FULL CODE  Antimicrobials:  Rocephin 8/27 > Azithromycin 8/27 >  DVT prophylaxis: Apixaban  Subjective: Afebrile.  Vital signs stable.  Saturation 98% on 3 L nasal cannula support.  Reports that she feels much better since her admission.  Denies severe abdominal pain chest pain or shortness of breath.  Assessment & Plan:  Acute hypoxic respiratory failure continue supplemental oxygen as needed but patient has rapidly improved with saturations presently 100% on very minimal support - possible due to mild effusion and atx v/s pneumonia   Sepsis POA due to LLL pneumonia v/s GB source w/ E coli bacteremia  Continue empiric antibiotic -met sepsis criteria at presentation due to fever, tachycardia, and tachypnea in presence of bacterial pneumonia - clinically stable presently - no convincing signs of a true PNA so will stop azithro   Gram negative rod bacteremia (2/2 cultures) - Recurrent E. 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    Family Communication: Spoke with 2 daughters at bedside Status is: Inpatient  Remains inpatient appropriate because:Inpatient level of care appropriate due to severity of illness  Dispo: The patient is from: Home              Anticipated d/c is to:  Unclear              Patient currently is not medically stable to d/c.   Difficult to place patient No     Objective: Blood pressure (!) 130/51, pulse 80, temperature 98.3 F (36.8 C), temperature source Oral, resp. rate 20, height 5' 5" (1.651 m), weight 92.3 kg, SpO2 98 %.  Intake/Output Summary (Last 24 hours) at 08/22/2021 1021 Last data filed at 08/22/2021 0958 Gross per 24 hour  Intake 590 ml  Output 540 ml  Net 50 ml   Filed Weights   08/21/21 1215 08/21/21 1847 08/22/21 0400  Weight: 93.9 kg 91.2 kg 92.3 kg    Examination: General: No acute  respiratory distress Lungs: Clear to auscultation bilaterally without wheezes or crackles Cardiovascular: Regular rate and rhythm without murmur gallop or rub normal S1 and S2 Abdomen: Overweight, soft, bowel sounds positive, no rebound, cholecystostomy tube insertion site clean and dry without erythema or discharge Extremities: Trace edema bilateral lower extremities without cyanosis or clubbing  CBC: Recent Labs  Lab 08/21/21 1336  WBC 8.8  NEUTROABS 7.1  HGB 11.3*  HCT 36.3  MCV 98.4  PLT 696    Basic Metabolic Panel: Recent Labs  Lab 08/18/21 0844  08/21/21 1336  NA 144 139  K 4.6 3.6  CL 106 105  CO2 23 26  GLUCOSE 163* 169*  BUN 9 9  CREATININE 0.75 0.73  CALCIUM 9.2 8.3*   GFR: Estimated Creatinine Clearance: 64 mL/min (by C-G formula based on SCr of 0.73 mg/dL).  Liver Function Tests: Recent Labs  Lab 08/18/21 0844 08/21/21 1336  AST 16 16  ALT 9 13  ALKPHOS 62 48  BILITOT 0.7 0.8  PROT 7.1 7.6  ALBUMIN 3.7 3.4*    HbA1C: Hemoglobin A1C  Date/Time Value Ref Range Status  11/09/2020 01:16 PM 7.5 (A) 4.0 - 5.6 % Final  07/06/2020 01:16 PM 7.7 (A) 4.0 - 5.6 % Final   HbA1c, POC (controlled diabetic range)  Date/Time Value Ref Range Status  05/20/2021 10:32 AM 7.3 (A) 0.0 - 7.0 % Final   Hgb A1c MFr Bld  Date/Time Value Ref Range Status  07/02/2021 01:55 AM 7.4 (H) 4.8 - 5.6 % Final    Comment:    (NOTE) Pre diabetes:          5.7%-6.4%  Diabetes:              >6.4%  Glycemic control for   <7.0% adults with diabetes   11/22/2016 07:00 AM 7.4 (H) 4.8 - 5.6 % Final    Comment:    (NOTE)         Pre-diabetes: 5.7 - 6.4         Diabetes: >6.4         Glycemic control for adults with diabetes: <7.0     CBG: Recent Labs  Lab 08/21/21 1727 08/21/21 2143 08/22/21 0549  GLUCAP 163* 178* 144*    Recent Results (from the past 240 hour(s))  Culture, blood (routine x 2)     Status: None (Preliminary result)   Collection Time: 08/21/21  1:29 PM   Specimen: Right Antecubital; Blood  Result Value Ref Range Status   Specimen Description   Final    RIGHT ANTECUBITAL Performed at [facility], [address], [place], [place] [address]    Special Requests   Final    BOTTLES DRAWN AEROBIC AND ANAEROBIC Blood Culture adequate volume Performed at [facility], [address] [place], [place] [address]    Culture  Setup Time   Final    ANAEROBIC BOTTLE GRAM NEGATIVE RODS CRITICAL RESULT CALLED TO, READ BACK BY AND VERIFIED WITH: Renold Genta 0134 08/22/2021 COLEMAN,R AEROBIC BOTTLE GRAM NEGATIVE  RODS PREVIOUSLY CALLED GRAM STAIN REVIEWED-AGREE WITH RESULT CRITICAL VALUE NOTED.  VALUE IS CONSISTENT WITH PREVIOUSLY REPORTED AND CALLED VALUE. Performed at [facility], [place] [address] [place], [place] [address]    Culture   Final    NO GROWTH < 24 HOURS Performed at [facility], [address], [place], [place] [address]    Report Status PENDING  Incomplete  Resp Panel by RT-PCR (Flu A&B, Covid) Nasopharyngeal Swab  Status: None   Collection Time: 08/21/21  1:36 PM   Specimen: Nasopharyngeal Swab; Nasopharyngeal(NP) swabs in vial transport medium  Result Value Ref Range Status   SARS Coronavirus 2 by RT PCR NEGATIVE NEGATIVE Final    Comment: (NOTE) SARS-CoV-2 target nucleic acids are NOT DETECTED.  The SARS-CoV-2 RNA is generally detectable in upper respiratory specimens during the acute phase of infection. The lowest concentration of SARS-CoV-2 viral copies this assay can detect is 138 copies/mL. A negative result does not preclude SARS-Cov-2 infection and should not be used as the sole basis for treatment or other patient management decisions. A negative result may occur with  improper specimen collection/handling, submission of specimen other than nasopharyngeal swab, presence of viral mutation(s) within the areas targeted by this assay, and inadequate number of viral copies(<138 copies/mL). A negative result must be combined with clinical observations, patient history, and epidemiological information. The expected result is Negative.  Fact Sheet for Patients:  EntrepreneurPulse.com.au  Fact Sheet for Healthcare Providers:  IncredibleEmployment.be  This test is no t yet approved or cleared by the [place] FDA and  has been authorized for detection and/or diagnosis of SARS-CoV-2 by FDA under an Emergency Use Authorization (EUA). This EUA will remain  in effect (meaning this test can be used) for the duration of  the COVID-19 declaration under Section 564(b)(1) of the Act, 21 U.S.C.section 360bbb-3(b)(1), unless the authorization is terminated  or revoked sooner.       Influenza A by PCR NEGATIVE NEGATIVE Final   Influenza B by PCR NEGATIVE NEGATIVE Final    Comment: (NOTE) The Xpert Xpress SARS-CoV-2/FLU/RSV plus assay is intended as an aid in the diagnosis of influenza from Nasopharyngeal swab specimens and should not be used as a sole basis for treatment. Nasal washings and aspirates are unacceptable for Xpert Xpress SARS-CoV-2/FLU/RSV testing.  Fact Sheet for Patients: EntrepreneurPulse.com.au  Fact Sheet for Healthcare Providers: IncredibleEmployment.be  This test is not yet approved or cleared by the [place] FDA and has been authorized for detection and/or diagnosis of SARS-CoV-2 by FDA under an Emergency Use Authorization (EUA). This EUA will remain in effect (meaning this test can be used) for the duration of the COVID-19 declaration under Section 564(b)(1) of the Act, 21 U.S.C. section 360bbb-3(b)(1), unless the authorization is terminated or revoked.  Performed at [facility], [address], [place], [place] [address]   Culture, blood (routine x 2)     Status: None (Preliminary result)   Collection Time: 08/21/21  1:37 PM   Specimen: Left Antecubital; Blood  Result Value Ref Range Status   Specimen Description   Final    LEFT ANTECUBITAL Performed at [facility], [address] [place], [place] [address]    Special Requests   Final    BOTTLES DRAWN AEROBIC AND ANAEROBIC Blood Culture adequate volume Performed at [facility], [address], [place], [place] [address]    Culture  Setup Time   Final    AEROBIC AND ANAEROBIC BOTTLE GRAM NEGATIVE RODS CRITICAL RESULT CALLED TO, READ BACK BY AND VERIFIED WITH: [place],E 0134 08/22/2021 COLEMAN,R GRAM STAIN REVIEWED-AGREE WITH RESULT CRITICAL RESULT CALLED TO, READ BACK BY AND  VERIFIED WITH: PHARMD JAMES LEDFORD 08/22/21_0 :01 BY TW Performed at [facility], [place] [address] [place], [place] [address]    Culture   Final    NO GROWTH < 24 HOURS Performed at [facility], [address], [place], [place] [address]    Report Status PENDING  Incomplete  Blood Culture ID Panel (Reflexed)     Status: Abnormal   Collection Time: 08/21/21  1:37 PM  Result Value Ref Range Status   Enterococcus faecalis NOT DETECTED NOT DETECTED Final   Enterococcus Faecium NOT DETECTED NOT DETECTED Final   Listeria monocytogenes NOT DETECTED NOT DETECTED Final   Staphylococcus species NOT DETECTED NOT DETECTED Final   Staphylococcus aureus (BCID) NOT DETECTED NOT DETECTED Final   Staphylococcus epidermidis NOT DETECTED NOT DETECTED Final   Staphylococcus lugdunensis NOT DETECTED NOT DETECTED Final   Streptococcus species NOT DETECTED NOT DETECTED Final   Streptococcus agalactiae NOT DETECTED NOT DETECTED Final   Streptococcus pneumoniae NOT DETECTED NOT DETECTED Final   Streptococcus pyogenes NOT DETECTED NOT DETECTED Final   A.calcoaceticus-baumannii NOT DETECTED NOT DETECTED Final   Bacteroides fragilis NOT DETECTED NOT DETECTED Final   Enterobacterales DETECTED (A) NOT DETECTED Final    Comment: Enterobacterales represent a large order of gram negative bacteria, not a single organism. CRITICAL RESULT CALLED TO, READ BACK BY AND VERIFIED WITH: PHARMD JAMES LEDFORD 08/22/21_0 :01 BY TW    Enterobacter cloacae complex NOT DETECTED NOT DETECTED Final   Escherichia coli DETECTED (A) NOT DETECTED Final    Comment: CRITICAL RESULT CALLED TO, READ BACK BY AND VERIFIED WITH: PHARMD JAMES LEDFORD 08/22/21_1 :01 BY TW    Klebsiella aerogenes NOT DETECTED NOT DETECTED Final   Klebsiella oxytoca NOT DETECTED NOT DETECTED Final   Klebsiella pneumoniae NOT DETECTED NOT DETECTED Final   Proteus species NOT DETECTED NOT DETECTED Final   Salmonella species NOT DETECTED NOT DETECTED Final    Serratia marcescens NOT DETECTED NOT DETECTED Final   Haemophilus influenzae NOT DETECTED NOT DETECTED Final   Neisseria meningitidis NOT DETECTED NOT DETECTED Final   Pseudomonas aeruginosa NOT DETECTED NOT DETECTED Final   Stenotrophomonas maltophilia NOT DETECTED NOT DETECTED Final   Candida albicans NOT DETECTED NOT DETECTED Final   Candida auris NOT DETECTED NOT DETECTED Final   Candida glabrata NOT DETECTED NOT DETECTED Final   Candida krusei NOT DETECTED NOT DETECTED Final   Candida parapsilosis NOT DETECTED NOT DETECTED Final   Candida tropicalis NOT DETECTED NOT DETECTED Final   Cryptococcus neoformans/gattii NOT DETECTED NOT DETECTED Final   CTX-M ESBL NOT DETECTED NOT DETECTED Final   Carbapenem resistance IMP NOT DETECTED NOT DETECTED Final   Carbapenem resistance KPC NOT DETECTED NOT DETECTED Final   Carbapenem resistance NDM NOT DETECTED NOT DETECTED Final   Carbapenem resist OXA 48 LIKE NOT DETECTED NOT DETECTED Final   Carbapenem resistance VIM NOT DETECTED NOT DETECTED Final    Comment: Performed at [facility], [address], [place], [place] [address]     Scheduled Meds:  apixaban  5 mg Oral BID   atorvastatin  40 mg Oral Daily   furosemide  40 mg Intravenous Daily   guaiFENesin  600 mg Oral BID   hydrALAZINE  50 mg Oral BID   insulin aspart  0-5 Units Subcutaneous QHS   insulin aspart  0-6 Units Subcutaneous TID WC   insulin glargine-yfgn  20 Units Subcutaneous Q2200   ipratropium-albuterol  3 mL Nebulization BID   losartan  50 mg Oral Daily   metoprolol succinate  50 mg Oral Daily   potassium chloride  10 mEq Oral Daily   prochlorperazine  25 mg Rectal Once   sodium chloride flush  3 mL Intravenous Q12H   sodium chloride flush  3 mL Intravenous Q12H   Continuous Infusions:  sodium chloride  azithromycin Stopped (08/21/21 1652)   cefTRIAXone (ROCEPHIN)  IV       LOS: 1 day   Cherene Altes, MD [facility] Office   [phone] Pager - Text Page per Amion  If 7PM-7AM, please contact night-coverage per Amion 08/22/2021, 10:21 AM

## 2021-08-22 NOTE — Progress Notes (Signed)
PHARMACY - PHYSICIAN COMMUNICATION CRITICAL VALUE ALERT - BLOOD CULTURE IDENTIFICATION (BCID)  Natalie Castro is an 80 y.o. female who presented to [facility] on 08/21/2021 with a chief complaint of respiratory failure  Assessment:  E Coli bacteremia. Pt has history of the same one month ago  Name of physician (or Provider) Contacted: Dr Myna Hidalgo  Current antibiotics: Ceftriaxone and Azithromycin   Changes to prescribed antibiotics recommended:  No changes, Ceftriaxone is already at correct dose  Results for orders placed or performed during the hospital encounter of 08/21/21  Blood Culture ID Panel (Reflexed) (Collected: 08/21/2021  1:37 PM)  Result Value Ref Range   Enterococcus faecalis NOT DETECTED NOT DETECTED   Enterococcus Faecium NOT DETECTED NOT DETECTED   Listeria monocytogenes NOT DETECTED NOT DETECTED   Staphylococcus species NOT DETECTED NOT DETECTED   Staphylococcus aureus (BCID) NOT DETECTED NOT DETECTED   Staphylococcus epidermidis NOT DETECTED NOT DETECTED   Staphylococcus lugdunensis NOT DETECTED NOT DETECTED   Streptococcus species NOT DETECTED NOT DETECTED   Streptococcus agalactiae NOT DETECTED NOT DETECTED   Streptococcus pneumoniae NOT DETECTED NOT DETECTED   Streptococcus pyogenes NOT DETECTED NOT DETECTED   A.calcoaceticus-baumannii NOT DETECTED NOT DETECTED   Bacteroides fragilis NOT DETECTED NOT DETECTED   Enterobacterales DETECTED (A) NOT DETECTED   Enterobacter cloacae complex NOT DETECTED NOT DETECTED   Escherichia coli DETECTED (A) NOT DETECTED   Klebsiella aerogenes NOT DETECTED NOT DETECTED   Klebsiella oxytoca NOT DETECTED NOT DETECTED   Klebsiella pneumoniae NOT DETECTED NOT DETECTED   Proteus species NOT DETECTED NOT DETECTED   Salmonella species NOT DETECTED NOT DETECTED   Serratia marcescens NOT DETECTED NOT DETECTED   Haemophilus influenzae NOT DETECTED NOT DETECTED   Neisseria meningitidis NOT DETECTED NOT DETECTED   Pseudomonas aeruginosa NOT  DETECTED NOT DETECTED   Stenotrophomonas maltophilia NOT DETECTED NOT DETECTED   Candida albicans NOT DETECTED NOT DETECTED   Candida auris NOT DETECTED NOT DETECTED   Candida glabrata NOT DETECTED NOT DETECTED   Candida krusei NOT DETECTED NOT DETECTED   Candida parapsilosis NOT DETECTED NOT DETECTED   Candida tropicalis NOT DETECTED NOT DETECTED   Cryptococcus neoformans/gattii NOT DETECTED NOT DETECTED   CTX-M ESBL NOT DETECTED NOT DETECTED   Carbapenem resistance IMP NOT DETECTED NOT DETECTED   Carbapenem resistance KPC NOT DETECTED NOT DETECTED   Carbapenem resistance NDM NOT DETECTED NOT DETECTED   Carbapenem resist OXA 48 LIKE NOT DETECTED NOT DETECTED   Carbapenem resistance VIM NOT DETECTED NOT DETECTED   Narda Bonds, PharmD, BCPS Clinical Pharmacist Phone: [phone]

## 2021-08-22 NOTE — Progress Notes (Signed)
Chief Complaint: Patient was seen in consultation today for  perc chole tube check   Referring Physician(s): Dr. Thereasa Solo  Supervising Physician: Jacqulynn Cadet  Patient Status: [facility] - In-pt  History of Present Illness: Natalie Castro is a 80 y.o. female patient known to this IR service s/p perc chole drain on 7/14 due to cholecystitis. She was not a good surgical candidate at that time. She then had cholangiogram with exchange on 8/2. She has been admitted with recurrent E. Coli bacteremia. Daughter at bedside, states surgeon moved back their follow up appointment towards the end of September. She would be due routine chole tube exchange soon as well. Pt sitting up eating in bed, denies c/o abd pain. Daughter reports no issues with drain, flushes well, has bilious output. Denies ever seeing drainage from skin site.   Past Medical History:  Diagnosis Date   Acid reflux    Arthritis    Colon adenomas    AGE 29   Complete heart block (HCC)    STJ PPM Dr. 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, long term anticoag with apixaban    Hyperlipidemia    Myoview 03/2021    Myoview 4/22: EF 50, no infarct or ischemia; low risk   Presence of permanent cardiac pacemaker    Type 2 diabetes mellitus ([place])     Past Surgical History:  Procedure Laterality Date   COLONOSCOPY     3 SIMPLE ADENOMAS, AGE 29   COLONOSCOPY N/A 05/24/2018   Procedure: COLONOSCOPY;  Surgeon: Danie Binder, MD;  [facility];  Service: Endoscopy;  Laterality: N/A;  1:00pm   EP IMPLANTABLE DEVICE N/A 11/24/2015   Procedure: Pacemaker Implant;  Surgeon: [name], MD;  Location: [place] [facility];  Service: Cardiovascular;  Laterality: N/A;   IR EXCHANGE BILIARY DRAIN  07/27/2021   IR PERC CHOLECYSTOSTOMY  07/08/2021   JOINT REPLACEMENT     knees bilat.   LEFT HEART CATH AND CORONARY ANGIOGRAPHY N/A 07/02/2021   Procedure: LEFT HEART CATH AND  CORONARY ANGIOGRAPHY;  Surgeon: Jettie Booze, MD;  Location: [place] [facility];  Service: Cardiovascular;  Laterality: N/A;   POLYPECTOMY  05/24/2018   Procedure: POLYPECTOMY;  Surgeon: Danie Binder, MD;  [facility];  Service: Endoscopy;;  ascending and hepatic flexure, transverse   TUBAL LIGATION      Allergies: Penicillins  Medications:  Current Facility-Administered Medications:    0.9 %  sodium chloride infusion, 250 mL, Intravenous, PRN, [place] Brick, Courage, MD   acetaminophen (TYLENOL) tablet 650 mg, 650 mg, Oral, Q6H PRN **OR** [DISCONTINUED] acetaminophen (TYLENOL) suppository 650 mg, 650 mg, Rectal, Q6H PRN, Emokpae, Courage, MD, 650 mg at 08/21/21 1700   albuterol (PROVENTIL) (2.5 MG/3ML) 0.083% nebulizer solution 2.5 mg, 2.5 mg, Nebulization, Q2H PRN, Emokpae, Courage, MD   azithromycin (ZITHROMAX) 500 mg in sodium chloride 0.9 % 250 mL IVPB, 500 mg, Intravenous, Q24H, Emokpae, Courage, MD, Stopped at 08/21/21 1652   bisacodyl (DULCOLAX) suppository 10 mg, 10 mg, Rectal, Daily PRN, Emokpae, Courage, MD   cefTRIAXone (ROCEPHIN) 2 g in sodium chloride 0.9 % 100 mL IVPB, 2 g, Intravenous, Q24H, McClung, Kimberlee Nearing, MD   guaiFENesin (MUCINEX) 12 hr tablet 600 mg, 600 mg, Oral, BID, Emokpae, Courage, MD, 600 mg at 08/22/21 1101   hydrALAZINE (APRESOLINE) tablet 50 mg, 50 mg, Oral, BID, Emokpae, Courage, MD, 50 mg at 08/22/21 1101   insulin  aspart (novoLOG) injection 0-5 Units, 0-5 Units, Subcutaneous, QHS, Emokpae, Courage, MD   insulin aspart (novoLOG) injection 0-6 Units, 0-6 Units, Subcutaneous, TID WC, Emokpae, Courage, MD, 1 Units at 08/22/21 1211   insulin glargine-yfgn (SEMGLEE) injection 20 Units, 20 Units, Subcutaneous, Q2200, Emokpae, Courage, MD   ipratropium-albuterol (DUONEB) 0.5-2.5 (3) MG/3ML nebulizer solution 3 mL, 3 mL, Nebulization, BID, McClung, Kimberlee Nearing, MD   labetalol (NORMODYNE) injection 10 mg, 10 mg, Intravenous, Q4H PRN, [place] Brick, Courage,  MD   losartan (COZAAR) tablet 50 mg, 50 mg, Oral, Daily, Emokpae, Courage, MD, 50 mg at 08/22/21 1100   metoprolol succinate (TOPROL-XL) 24 hr tablet 50 mg, 50 mg, Oral, Daily, Emokpae, Courage, MD, 50 mg at 08/22/21 1100   nitroGLYCERIN (NITROSTAT) SL tablet 0.4 mg, 0.4 mg, Sublingual, Q5 min PRN, Emokpae, Courage, MD   ondansetron (ZOFRAN) tablet 4 mg, 4 mg, Oral, Q6H PRN **OR** ondansetron (ZOFRAN) injection 4 mg, 4 mg, Intravenous, Q6H PRN, Emokpae, Courage, MD   polyethylene glycol (MIRALAX / GLYCOLAX) packet 17 g, 17 g, Oral, Daily PRN, Emokpae, Courage, MD   potassium chloride (KLOR-CON) CR tablet 10 mEq, 10 mEq, Oral, Daily, Emokpae, Courage, MD, 10 mEq at 08/22/21 1100   prochlorperazine (COMPAZINE) suppository 25 mg, 25 mg, Rectal, Once, Emokpae, Courage, MD   sodium chloride flush (NS) 0.9 % injection 3 mL, 3 mL, Intravenous, Q12H, Emokpae, Courage, MD, 3 mL at 08/21/21 2045   sodium chloride flush (NS) 0.9 % injection 3 mL, 3 mL, Intravenous, Q12H, Emokpae, Courage, MD, 3 mL at 08/22/21 1115   sodium chloride flush (NS) 0.9 % injection 3 mL, 3 mL, Intravenous, PRN, Emokpae, Courage, MD   traZODone (DESYREL) tablet 50 mg, 50 mg, Oral, QHS PRN, [place] Brick, Courage, MD    Family History  Problem Relation Age of Onset   Heart failure Mother    Hypertension Mother    Heart failure Sister    Hypertension Father    Cancer Sister    Dementia Sister    Colon cancer Neg Hx    Colon polyps Neg Hx     Social History   Socioeconomic History   Marital status: Widowed    Spouse name: Not on file   Number of children: Not on file   Years of education: Not on file   Highest education level: Not on file  Occupational History   Occupation: retired  Tobacco Use   Smoking status: Never   Smokeless tobacco: Never  Vaping Use   Vaping Use: Never used  Substance and Sexual Activity   Alcohol use: No    Alcohol/week: 0.0 standard drinks   Drug use: No   Sexual activity: Yes    Birth  control/protection: Post-menopausal  Other Topics Concern   Not on file  Social History Narrative   MARRIED FOR 47 YRS. 5 KIDS: #4 PRESENT TODAY(AGE 57)   Social Determinants of Health   Financial Resource Strain: Not on file  Food Insecurity: Not on file  Transportation Needs: Not on file  Physical Activity: Not on file  Stress: Not on file  Social Connections: Not on file    Review of Systems: A 12 point ROS discussed and pertinent positives are indicated in the HPI above.  All other systems are negative.  Review of Systems  Vital Signs: BP 135/62   Pulse 74   Temp 98.3 F (36.8 C) (Oral)   Resp 18   Ht '5\' 5"'$  (1.651 m)   Wt 92.3 kg   SpO2 97%  BMI 33.86 kg/m   Physical Exam Constitutional:      Appearance: She is not ill-appearing.  HENT:     Mouth/Throat:     Mouth: Mucous membranes are moist.  Cardiovascular:     Rate and Rhythm: Normal rate and regular rhythm.     Heart sounds: Normal heart sounds.  Abdominal:     General: Abdomen is flat.     Palpations: Abdomen is soft.     Comments: RUQ perc chole drain intact, site clean dry. Small amount bilious output in bag. Drain flushed with about 7 mL NS without difficulty, return of thin bilious fluid.  Neurological:     General: No focal deficit present.     Mental Status: She is alert and oriented to person, place, and time.      Imaging: DG Chest 2 View  Result Date: 08/22/2021 CLINICAL DATA:  Dyspnea and respiratory failure. EXAM: CHEST - 2 VIEW COMPARISON:  08/21/2021 FINDINGS: Left chest wall pacer device is noted with leads over the right atrial appendage and right ventricle. Stable cardiomediastinal contours. Interval worsening of aeration to the left lower lung which likely reflects a combination of new pleural effusion as well as atelectasis or consolidation in the left base. Stable appearance of the right lung. IMPRESSION: Worsening aeration to the left lower lung which likely reflects a combination  of new pleural effusion as well as atelectasis and consolidation in the left lower lobe. Electronically Signed   By: Kerby Moors M.D.   On: 08/22/2021 09:59   DG Chest Port 1 View  Result Date: 08/21/2021 CLINICAL DATA:  Shortness of breath EXAM: PORTABLE CHEST 1 VIEW COMPARISON:  Chest x-rays dated 07/04/2021 and 07/01/2021. FINDINGS: Ill-defined opacity at the LEFT lung base. Probable small bilateral pleural effusions. No pneumothorax is seen. Stable cardiomegaly. LEFT chest wall pacemaker/ICD apparatus appears stable in position. Osseous structures about the chest are unremarkable. IMPRESSION: 1. Ill-defined opacity at the LEFT lung base, pneumonia versus atelectasis. 2. Probable small bilateral pleural effusions. 3. Stable cardiomegaly. Electronically Signed   By: Franki Cabot M.D.   On: 08/21/2021 13:38   CUP PACEART REMOTE DEVICE CHECK  Result Date: 08/18/2021 Scheduled remote reviewed. Normal device function.  14 AMS, atrial arrhythmia all < 56mn 14sec.  Eliquis prescribed. Next remote 91 days. LR  IR EXCHANGE BILIARY DRAIN  Result Date: 07/27/2021 INDICATION: History of acute cholecystitis, post ultrasound fluoroscopic guided cholecystostomy tube placement on July 08, 2021. The patient and family report no drain output. EXAM: FLUOROSCOPIC GUIDED CHOLECYSTOSTOMY TUBE EXCHANGE COMPARISON:  Image guided cholecystostomy tube placement-07/08/2021 MEDICATIONS: None ANESTHESIA/SEDATION: None CONTRAST:  334mOMNIPAQUE IOHEXOL 300 MG/ML SOLN - administered into the gallbladder lumen. FLUOROSCOPY TIME:  2.6 minutes, 18 mGy COMPLICATIONS: None immediate. PROCEDURE: The patient was positioned supine on the fluoroscopy table. The external portion of the existing cholecystostomy tube as well as the surrounding skin was prepped and draped in usual sterile fashion. A time-out was performed prior to the initiation of the procedure. A pre-procedural spot fluoroscopic image was obtained of the right upper  abdominal quadrant existing cholecystostomy tube. The skin surrounding the cholecystostomy tube was anesthetized with 1% lidocaine with epinephrine. The external portion of the cholecystostomy tube was cut and cannulated with a short Amplatz wire which was advanced through the tube and coiled within the gallbladder lumen. Next, under intermittent fluoroscopic guidance, the existing cholecystostomy tube was exchanged for a new, 12 FrPakistanholecystostomy tube which was repositioned into the more central aspect of the gallbladder  lumen. Contrast injection confirms appropriate positioning and functionality of the cholecystomy tube. The cholecystostomy tube was flushed with a small amount of saline and reconnected to a gravity bag. The cholecystostomy tube was secured with an interrupted suture and a Stat Lock device. A dressing was applied. The patient tolerated the procedure well without immediate postprocedural complication. FINDINGS: Pre-procedural spot fluoroscopic image demonstrates unchanged positioning of cholecystostomy tube with end coiled and locked over the expected location of the fundus of the gallbladder After fluoroscopic guided exchange, the new 12 French cholecystostomy tube is more ideally positioned with end coiled and locked within the central aspect of the gallbladder lumen. Post exchange cholangiogram demonstrates appropriate positioning and functionality of the new cholecystostomy tube. IMPRESSION: Successful fluoroscopic-guided exchange and repositioning of a 77 French cholecystostomy tube. PLAN: - The patient's cholecystostomy tube was reconnected to a gravity bag. - The patient may return to the interventional radiology drain clinic in 4 weeks for repeat diagnostic cholangiogram. The patient knows to call the interventional radiology drain clinic [phone]) with any interval questions or concerns. Michaelle Birks, MD [facility]  Electronically Signed   By: Michaelle Birks MD   On: 07/27/2021 16:55    Labs:  CBC: Recent Labs    07/09/21 0233 07/10/21 0248 07/11/21 0139 08/21/21 1336  WBC 12.7* 9.9 8.5 8.8  HGB 9.8* 9.8* 9.7* 11.3*  HCT 31.7* 30.9* 30.2* 36.3  PLT 181 184 231 222    COAGS: No results for input(s): INR, APTT in the last 8760 hours.  BMP: Recent Labs    07/09/21 0233 07/10/21 0248 07/11/21 0139 08/18/21 0844 08/21/21 1336  NA 143 141 138 144 139  K 3.7 3.4* 3.4* 4.6 3.6  CL 115* 113* 110 106 105  CO2 '25 23 24 23 26  '$ GLUCOSE 123* 118* 139* 163* 169*  BUN 9 8 5* 9 9  CALCIUM 8.0* 7.8* 7.7* 9.2 8.3*  CREATININE 0.83 0.68 0.69 0.75 0.73  GFRNONAA >60 >60 >60  --  >60    LIVER FUNCTION TESTS: Recent Labs    07/10/21 0248 07/11/21 0139 08/18/21 0844 08/21/21 1336  BILITOT 0.5 0.3 0.7 0.8  AST 14* 12* 16 16  ALT '7 7 9 13  '$ ALKPHOS 49 44 62 48  PROT 5.6* 5.5* 7.1 7.6  ALBUMIN 1.7* 1.6* 3.7 3.4*    TUMOR MARKERS: No results for input(s): AFPTM, CEA, CA199, CHROMGRNA in the last 8760 hours.  Assessment and Plan: Hx of cholecystitis s/p perc chole drain Recurrent E. Coli bacteremia. Drain looks and functions fine. D/w attending, if concern for abscess or other source for bacteremia, would recommend CT abd/pel with IV contrast. She is otherwise about due for routine chole tube exchange. We can plan for cholangiogram with exchange sometime this week, schedule permitting. Discussed with pt and daughter at bedside  Thank you for this interesting consult.  I greatly enjoyed meeting SKYLEE HELMKE and look forward to participating in their care.  A copy of this report was sent to the requesting provider on this date.  Electronically Signed: Ascencion Dike, PA-C 08/22/2021, 12:59 PM   I spent a total of 20 minutes in face to face in clinical consultation, greater than 50% of which was counseling/coordinating care for perc chole drain mgmt

## 2021-08-22 NOTE — Progress Notes (Signed)
Received positive preliminary BC results from lab and notified MD on call via [place].

## 2021-08-22 NOTE — Evaluation (Signed)
Clinical/Bedside Swallow Evaluation Patient Details  Name: Natalie Castro MRN: [id number] Date of Birth: Aug 07, 1941  Today's Date: 08/22/2021 Time: SLP Start Time (ACUTE ONLY): 0910 SLP Stop Time (ACUTE ONLY): 0920 SLP Time Calculation (min) (ACUTE ONLY): 10 min  Past Medical History:  Past Medical History:  Diagnosis Date   Acid reflux    Arthritis    Colon adenomas    AGE 80   Complete heart block (HCC)    STJ PPM Dr. 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, long term anticoag with apixaban    Hyperlipidemia    Myoview 03/2021    Myoview 4/22: EF 50, no infarct or ischemia; low risk   Presence of permanent cardiac pacemaker    Type 2 diabetes mellitus ([place])    Past Surgical History:  Past Surgical History:  Procedure Laterality Date   COLONOSCOPY     3 SIMPLE ADENOMAS, AGE 49   COLONOSCOPY N/A 05/24/2018   Procedure: COLONOSCOPY;  Surgeon: Danie Binder, MD;  [facility];  Service: Endoscopy;  Laterality: N/A;  1:00pm   EP IMPLANTABLE DEVICE N/A 11/24/2015   Procedure: Pacemaker Implant;  Surgeon: [name], MD;  Location: [place] [facility];  Service: Cardiovascular;  Laterality: N/A;   IR EXCHANGE BILIARY DRAIN  07/27/2021   IR PERC CHOLECYSTOSTOMY  07/08/2021   JOINT REPLACEMENT     knees bilat.   LEFT HEART CATH AND CORONARY ANGIOGRAPHY N/A 07/02/2021   Procedure: LEFT HEART CATH AND CORONARY ANGIOGRAPHY;  Surgeon: Jettie Booze, MD;  Location: [place] [facility];  Service: Cardiovascular;  Laterality: N/A;   POLYPECTOMY  05/24/2018   Procedure: POLYPECTOMY;  Surgeon: Danie Binder, MD;  [facility];  Service: Endoscopy;;  ascending and hepatic flexure, transverse   TUBAL LIGATION     HPI:  Natalie Castro  is a 80 y.o. female  with past medical history significant for complete heart block s/p Saint Jude PPM, HTN, HLD, DM2, history of PE on chronic anticoagulation, chronic diastolic  congestive heart failure who presents to the ED with fevers, shortness of breath and cough and is found to be hypoxic with O2 sats of 83-85 % on room air   Assessment / Plan / Recommendation Clinical Impression  Pt demonstrates no signs of dysphagia and has no complaint other than a difficutly with "tough hard" foods. She does not wear a top denture when eating, but demonstrated ability to masticate a graham cracker well with gums. She is capable of making appropriate food choices and cutting up her own food. Recommend continuing a regular diet and thin liquids, but changed order to "with assist" to encourage staff to help pt order foods of choice SLP Visit Diagnosis: Dysphagia, unspecified (R13.10)    Aspiration Risk  Mild aspiration risk    Diet Recommendation Regular;Thin liquid   Liquid Administration via: Cup;Straw Medication Administration: Whole meds with liquid Supervision: Patient able to self feed    Other  Recommendations Oral Care Recommendations: Oral care BID   Follow up Recommendations        Frequency and Duration            Prognosis        Swallow Study   General HPI: Natalie Castro  is a 80 y.o. female  with past medical history significant for complete heart block s/p Saint Jude PPM, HTN, HLD, DM2, history of PE on chronic anticoagulation, chronic diastolic  congestive heart failure who presents to the ED with fevers, shortness of breath and cough and is found to be hypoxic with O2 sats of 83-85 % on room air Type of Study: Bedside Swallow Evaluation Diet Prior to this Study: Regular;Thin liquids Temperature Spikes Noted: No Respiratory Status: Room air History of Recent Intubation: No Behavior/Cognition: Alert;Cooperative;Pleasant mood Oral Cavity Assessment: Within Functional Limits Oral Care Completed by SLP: No Oral Cavity - Dentition: Edentulous;Missing dentition (edentulous top, doesnt wear denture to eat) Self-Feeding Abilities: Able to feed  self Patient Positioning: Upright in bed Baseline Vocal Quality: Normal Volitional Cough: Strong Volitional Swallow: Able to elicit    Oral/Motor/Sensory Function Overall Oral Motor/Sensory Function: Within functional limits   Ice Chips     Thin Liquid Thin Liquid: Within functional limits Presentation: Straw;Self Fed    Nectar Thick Nectar Thick Liquid: Not tested   Honey Thick Honey Thick Liquid: Not tested   Puree Puree: Within functional limits Presentation: Self Fed;Spoon   Solid     Solid: Within functional limits Presentation: Earle Shanterria Franta, [place] Office [phone]  Lynann Beaver 08/22/2021,10:27 AM

## 2021-08-23 ENCOUNTER — Inpatient Hospital Stay (HOSPITAL_COMMUNITY): Payer: Medicare Other

## 2021-08-23 DIAGNOSIS — R7881 Bacteremia: Secondary | ICD-10-CM | POA: Diagnosis not present

## 2021-08-23 DIAGNOSIS — J9601 Acute respiratory failure with hypoxia: Secondary | ICD-10-CM

## 2021-08-23 HISTORY — PX: IR EXCHANGE BILIARY DRAIN: IMG6046

## 2021-08-23 LAB — CBC
HCT: 29.3 % — ABNORMAL LOW (ref 36.0–46.0)
Hemoglobin: 9.2 g/dL — ABNORMAL LOW (ref 12.0–15.0)
MCH: 30.6 pg (ref 26.0–34.0)
MCHC: 31.4 g/dL (ref 30.0–36.0)
MCV: 97.3 fL (ref 80.0–100.0)
Platelets: 162 10*3/uL (ref 150–400)
RBC: 3.01 MIL/uL — ABNORMAL LOW (ref 3.87–5.11)
RDW: 16 % — ABNORMAL HIGH (ref 11.5–15.5)
WBC: 7.9 10*3/uL (ref 4.0–10.5)
nRBC: 0 % (ref 0.0–0.2)

## 2021-08-23 LAB — COMPREHENSIVE METABOLIC PANEL
ALT: 13 U/L (ref 0–44)
AST: 19 U/L (ref 15–41)
Albumin: 2.3 g/dL — ABNORMAL LOW (ref 3.5–5.0)
Alkaline Phosphatase: 40 U/L (ref 38–126)
Anion gap: 6 (ref 5–15)
BUN: 11 mg/dL (ref 8–23)
CO2: 27 mmol/L (ref 22–32)
Calcium: 8.1 mg/dL — ABNORMAL LOW (ref 8.9–10.3)
Chloride: 103 mmol/L (ref 98–111)
Creatinine, Ser: 0.69 mg/dL (ref 0.44–1.00)
GFR, Estimated: >60 mL/min (ref >60)
Glucose, Bld: 115 mg/dL — ABNORMAL HIGH (ref 70–99)
Potassium: 3.5 mmol/L (ref 3.5–5.1)
Sodium: 136 mmol/L (ref 135–145)
Total Bilirubin: 0.7 mg/dL (ref 0.3–1.2)
Total Protein: 5.9 g/dL — ABNORMAL LOW (ref 6.5–8.1)

## 2021-08-23 LAB — URINE CULTURE: Culture: 30000 — AB

## 2021-08-23 LAB — GLUCOSE, CAPILLARY
Glucose-Capillary: 104 mg/dL — ABNORMAL HIGH (ref 70–99)
Glucose-Capillary: 192 mg/dL — ABNORMAL HIGH (ref 70–99)
Glucose-Capillary: 166 mg/dL — ABNORMAL HIGH (ref 70–99)
Glucose-Capillary: 186 mg/dL — ABNORMAL HIGH (ref 70–99)

## 2021-08-23 LAB — MAGNESIUM: Magnesium: 1.5 mg/dL — ABNORMAL LOW (ref 1.7–2.4)

## 2021-08-23 MED ORDER — LIDOCAINE HCL (PF) 1 % IJ SOLN
INTRAMUSCULAR | Status: DC | PRN
  Administered 2021-08-23: 5 mL

## 2021-08-23 MED ORDER — SODIUM CHLORIDE 0.9% FLUSH
5.0000 mL | Freq: Every day | INTRAVENOUS | Status: DC
  Administered 2021-08-23 – 2021-08-27 (×3): 5 mL

## 2021-08-23 MED ORDER — IOHEXOL 240 MG/ML SOLN
50.0000 mL | Freq: Once | INTRAMUSCULAR | Status: AC | PRN
  Administered 2021-08-23: 10 mL

## 2021-08-23 MED ORDER — LIDOCAINE HCL 1 % IJ SOLN
INTRAMUSCULAR | Status: AC
  Filled 2021-08-23: qty 20

## 2021-08-23 MED ORDER — POTASSIUM CHLORIDE CRYS ER 20 MEQ PO TBCR
40.0000 meq | EXTENDED_RELEASE_TABLET | Freq: Once | ORAL | Status: AC
  Administered 2021-08-23: 40 meq via ORAL
  Filled 2021-08-23: qty 2

## 2021-08-23 MED ORDER — MAGNESIUM SULFATE 4 GM/100ML IV SOLN
4.0000 g | Freq: Once | INTRAVENOUS | Status: AC
  Administered 2021-08-23: 4 g via INTRAVENOUS
  Filled 2021-08-23: qty 100

## 2021-08-23 NOTE — Consult Note (Addendum)
I have seen and examined the patient. I have personally reviewed the clinical findings, laboratory findings, microbiological data and imaging studies. The assessment and treatment plan was discussed with the resident Iona Beard.  I agree with her/his recommendations except following additions/corrections.  80 Y O female with a PMH of multiple comorbidties including CHB s/p PPM who initially presented to the ED with SOB.  AT ED, she was found to be hypoxemic and needed to be on supplemental Oxygen. Febrile, T max 103.2, WBC 8.8. Chest Xray concerning for consolidation in the LLL. Patient was empirically started on IV ceftriaxone and PO azithromycin for concerns of PNA. Antobiotics de-escalated to ceftriaxone once blood cultures came positive for E coli in both sets.   Of note patient was recently admitted for E coli bacteremia in 06/2021 2/2 acute cholecystitis where patient had a PERC chole on 7/14. Patient was treated with an appropriate course of IV ceftriaxone and metronidazole>> cefdinir and metronidazole which patient tells me she has completed without missing any doses. She had a recent biliary drain exchange on 8/2 for malfunction. She also had her PPM reprogrammed on 8/25 by Dr Lovena Le.   She tells me her primary issue for this admission is SOB unlike GI symptoms as would have expected with a biliary source for her recurrent E coli bacteremia. Her LFTS are WNL. She does not have any central lines. UA and urine cx unremarkable. CT abdomen/pelvis with cholecystostomy tube within the gallbladder lumen. Associated pericholecystic fluid.   Vitals BP 120/68 (BP Location: Right Arm)   Pulse 70   Temp 98.3 F (36.8 C) (Oral)   Resp 17   Ht '5\' 5"'$  (1.651 m)   Wt 92.8 kg   SpO2 98%   BMI 34.05 kg/m   Exam- on Nasal cannula, mild resp distress CVS- [id number], systolic murmur+ Pulmonary - diminished lung sounds at bases Abdomen - soft   While her GB and biliary tract can definitely be a source  for her recurrent bacteremia, I am also concerned about her PPM being infected and would get a TTE  Continue ceftriaxone for now.  Repeat blood cultures for clearance  Following   Rosiland Oz, MD [facility]    Date of Admission:  08/21/2021   Total days of antibiotics 3        Ceftriaxone started 08/21/2021        Azithromycin 08/21/2021-08/22/2021        Reason for Consult: Recurrent e coli bacteremia    Referring Provider: Modena Jansky, MD Primary Care Provider: [name], MD   Assessment: Natalie Castro is a 80 y.o. female with a past medical history of complete heart block s/p PPM, HFpEF, T2DM, HTN, HLD, history of PE on apixiban who presented for dyspnea and cough admitted for acute hypoxic respiratory failure and sepsis. She was recently admitted from 07/01/2021-07/11/2021 for e coli bacteremia secondary to acute cholecystitis s/p percutaneous cholecystostomy tube. Blood cultures on 7/10/20222 with pansenstive e coli, repeat cultures on 07/06/2021 without growth.  She completed a 7 day course of cefdinir and metronidazole. Had drain repositioned on 07/27/2021 with drain remaining in place on admission.  She was initially febrile to 103 on admission. No leukocytosis, CMP unremarkable. CT abdomen and pelvis with pericholecystic fluid and small bilateral pleural effusions and atelectasis. Was started on ceftriaxone and azithromycin on admission. Now on day 3 ceftriaxone. Bacterial cultures with  recurrent e coli, urinary cultures negative.   Patient states she continues to have dyspnea with activity. Denies any nausea, vomiting, abdominal pain, or urinary symptoms. IR recommending cholecystostomy tube exchange this week. Recurrent e coli bacteremia could be from her cholecystitis, however given history of PPM will check repeat cultures and echo to evaluate for seeding of her  device.  Plan: Continue ceftriaxone 2g daily Echocardiogram Repeat blood cultures   Principal Problem:   Acute respiratory failure with hypoxia --- due to CHF and left-sided pneumonia Active Problems:   Essential hypertension, benign   Complete heart block (HCC)/[employer] pacemaker   Lt sided CAP (community acquired pneumonia)   Uncontrolled type 2 diabetes mellitus with hyperglycemia (HCC)   Acute exacerbation of CHF (congestive heart failure) (HCC)   Acute on chronic Diastolic heart failure with preserved ejection fraction (HFpEF) (HCC)    apixaban  5 mg Oral BID   guaiFENesin  600 mg Oral BID   hydrALAZINE  50 mg Oral BID   insulin aspart  0-5 Units Subcutaneous QHS   insulin aspart  0-6 Units Subcutaneous TID WC   insulin glargine-yfgn  20 Units Subcutaneous Q2200   ipratropium-albuterol  3 mL Nebulization BID   losartan  50 mg Oral Daily   metoprolol succinate  50 mg Oral Daily   potassium chloride  10 mEq Oral Daily   prochlorperazine  25 mg Rectal Once   sodium chloride flush  3 mL Intravenous Q12H   sodium chloride flush  3 mL Intravenous Q12H   sodium chloride flush  5 mL Intracatheter Daily    HPI: Natalie Castro is a 80 y.o. female with a past medical history of complete heart block s/p PPM, HFpEF, T2DM, HTN, HLD, history of PE on apixiban who presented for dyspnea and cough admitted for acute hypoxic respiratory failure and sepsis. Noted to be febrile, with tachypnea, and tachycardia on admission. Started on ceftriaxone and azithromycin for LLL pneumonia. Blood cultures with recurrent E. Coli bacteremia. Recently admitted on 07/01/2021 for acute cholecystitis with e coli bacteremia s/p percutaneous cholecystostomy tube that is still in place. She completed a 7 day course of cefdinir and metronidazole as well. Patient denies any fever, chills, nausea, vomiting, abdominal pain, and urinary symptoms. Denies increase drainage from cholecystostomy tube. No leukocytosis, CMP  unremarkable. CT abdomen and pelvis with pericholecystic fluid and small bilateral pleural effusions. Bacterial cultures with recurrent e coli, urinary cultures negative. ID consulted for assistance with recurrent e coli bacteremia.  Review of Systems: Review of Systems  Constitutional:  Negative for chills and fever.  HENT:  Negative for sinus pain and sore throat.   Eyes:  Negative for photophobia, pain and discharge.  Respiratory:  Negative for cough and shortness of breath.   Cardiovascular:  Negative for chest pain and palpitations.  Gastrointestinal:  Negative for abdominal pain, nausea and vomiting.  Genitourinary:  Negative for dysuria and flank pain.  Musculoskeletal:  Negative for back pain and joint pain.  Skin:  Negative for itching and rash.  Neurological:  Negative for sensory change, weakness and headaches.   Past Medical History:  Diagnosis Date   Acid reflux    Arthritis    Colon adenomas    AGE 72   Complete heart block (HCC)    STJ PPM Dr. 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, long term anticoag with apixaban    Hyperlipidemia    Myoview 03/2021  Myoview 4/22: EF 50, no infarct or ischemia; low risk   Presence of permanent cardiac pacemaker    Type 2 diabetes mellitus (HCC)     Social History   Tobacco Use   Smoking status: Never   Smokeless tobacco: Never  Vaping Use   Vaping Use: Never used  Substance Use Topics   Alcohol use: No    Alcohol/week: 0.0 standard drinks   Drug use: No    Family History  Problem Relation Age of Onset   Heart failure Mother    Hypertension Mother    Heart failure Sister    Hypertension Father    Cancer Sister    Dementia Sister    Colon cancer Neg Hx    Colon polyps Neg Hx    Allergies  Allergen Reactions   Penicillins Hives, Itching and Other (See Comments)    Has tolerated ceftriaxone 7/22 Has patient had a PCN reaction causing immediate rash,  facial/tongue/throat swelling, SOB or lightheadedness with hypotension: No Has patient had a PCN reaction causing severe rash involving mucus membranes or skin necrosis: Yes Has patient had a PCN reaction that required hospitalization No Has patient had a PCN reaction occurring within the last 10 years: No If all of the above answers are "NO", then may proceed with Cephalosporin use.     OBJECTIVE: Blood pressure (!) 137/55, pulse 68, temperature 98.3 F (36.8 C), temperature source Oral, resp. rate 18, height '5\' 5"'$  (1.651 m), weight 92.8 kg, SpO2 100 %.  Physical Exam Constitutional:      General: She is not in acute distress.    Appearance: She is well-developed. She is obese.  HENT:     Head: Normocephalic and atraumatic.  Cardiovascular:     Rate and Rhythm: Normal rate and regular rhythm.  Pulmonary:     Effort: Pulmonary effort is normal.     Breath sounds: Normal breath sounds. No wheezing.  Abdominal:     General: Bowel sounds are normal.     Palpations: Abdomen is soft.     Tenderness: There is no abdominal tenderness.  Musculoskeletal:     Cervical back: Normal range of motion and neck supple.     Right lower leg: Edema present.     Left lower leg: Edema present.  Skin:    General: Skin is warm and dry.     Capillary Refill: Capillary refill takes less than 2 seconds.  Neurological:     General: No focal deficit present.     Mental Status: She is alert and oriented to person, place, and time.  Psychiatric:        Mood and Affect: Mood normal.        Behavior: Behavior normal.    Lab Results Lab Results  Component Value Date   WBC 7.9 08/23/2021   HGB 9.2 (L) 08/23/2021   HCT 29.3 (L) 08/23/2021   MCV 97.3 08/23/2021   PLT 162 08/23/2021    Lab Results  Component Value Date   CREATININE 0.69 08/23/2021   BUN 11 08/23/2021   NA 136 08/23/2021   K 3.5 08/23/2021   CL 103 08/23/2021   CO2 27 08/23/2021    Lab Results  Component Value Date   ALT 13  08/23/2021   AST 19 08/23/2021   ALKPHOS 40 08/23/2021   BILITOT 0.7 08/23/2021     Microbiology: Recent Results (from the past 240 hour(s))  Culture, blood (routine x 2)     Status: Abnormal (Preliminary result)  Collection Time: 08/21/21  1:29 PM   Specimen: Right Antecubital; Blood  Result Value Ref Range Status   Specimen Description   Final    RIGHT ANTECUBITAL Performed at [facility], [address], [place], [place] [address]    Special Requests   Final    BOTTLES DRAWN AEROBIC AND ANAEROBIC Blood Culture adequate volume Performed at [facility], [address], [place], [place] [address]    Culture  Setup Time   Final    IN BOTH AEROBIC AND ANAEROBIC BOTTLES GRAM NEGATIVE RODS CRITICAL RESULT CALLED TO, READ BACK BY AND VERIFIED WITH: Renold Genta OR:[id number] 08/22/2021 COLEMAN,R Performed at [facility], [place] [address], [place], [place] [address]    Culture ESCHERICHIA COLI (A)  Final   Report Status PENDING  Incomplete  Resp Panel by RT-PCR (Flu A&B, Covid) Nasopharyngeal Swab     Status: None   Collection Time: 08/21/21  1:36 PM   Specimen: Nasopharyngeal Swab; Nasopharyngeal(NP) swabs in vial transport medium  Result Value Ref Range Status   SARS Coronavirus 2 by RT PCR NEGATIVE NEGATIVE Final    Comment: (NOTE) SARS-CoV-2 target nucleic acids are NOT DETECTED.  The SARS-CoV-2 RNA is generally detectable in upper respiratory specimens during the acute phase of infection. The lowest concentration of SARS-CoV-2 viral copies this assay can detect is 138 copies/mL. A negative result does not preclude SARS-Cov-2 infection and should not be used as the sole basis for treatment or other patient management decisions. A negative result may occur with  improper specimen collection/handling, submission of specimen other than nasopharyngeal swab, presence of viral mutation(s) within the areas targeted by this assay, and inadequate number of viral copies(<138 copies/mL). A  negative result must be combined with clinical observations, patient history, and epidemiological information. The expected result is Negative.  Fact Sheet for Patients:  EntrepreneurPulse.com.au  Fact Sheet for Healthcare Providers:  IncredibleEmployment.be  This test is no t yet approved or cleared by the [place] FDA and  has been authorized for detection and/or diagnosis of SARS-CoV-2 by FDA under an Emergency Use Authorization (EUA). This EUA will remain  in effect (meaning this test can be used) for the duration of the COVID-19 declaration under Section 564(b)(1) of the Act, 21 U.S.C.section 360bbb-3(b)(1), unless the authorization is terminated  or revoked sooner.       Influenza A by PCR NEGATIVE NEGATIVE Final   Influenza B by PCR NEGATIVE NEGATIVE Final    Comment: (NOTE) The Xpert Xpress SARS-CoV-2/FLU/RSV plus assay is intended as an aid in the diagnosis of influenza from Nasopharyngeal swab specimens and should not be used as a sole basis for treatment. Nasal washings and aspirates are unacceptable for Xpert Xpress SARS-CoV-2/FLU/RSV testing.  Fact Sheet for Patients: EntrepreneurPulse.com.au  Fact Sheet for Healthcare Providers: IncredibleEmployment.be  This test is not yet approved or cleared by the [place] FDA and has been authorized for detection and/or diagnosis of SARS-CoV-2 by FDA under an Emergency Use Authorization (EUA). This EUA will remain in effect (meaning this test can be used) for the duration of the COVID-19 declaration under Section 564(b)(1) of the Act, 21 U.S.C. section 360bbb-3(b)(1), unless the authorization is terminated or revoked.  Performed at [facility], [address] [place],  [address]   Culture, blood (routine x 2)     Status: Abnormal (Preliminary result)   Collection Time: 08/21/21  1:37 PM   Specimen: Left Antecubital; Blood  Result  Value Ref Range Status   Specimen Description  Final    LEFT ANTECUBITAL Performed at [facility], [address], [place], [place] [address]    Special Requests   Final    BOTTLES DRAWN AEROBIC AND ANAEROBIC Blood Culture adequate volume Performed at [facility], [address] [place], [place] [address]    Culture  Setup Time   Final    AEROBIC AND ANAEROBIC BOTTLE GRAM NEGATIVE RODS CRITICAL RESULT CALLED TO, READ BACK BY AND VERIFIED WITH: [id number] 08/22/2021 COLEMAN,R GRAM STAIN REVIEWED-AGREE WITH RESULT CRITICAL RESULT CALLED TO, READ BACK BY AND VERIFIED WITH: PHARMD JAMES LEDFORD 08/22/21'@6'$ :01 BY TW Performed at [facility], [place] [address], [place], [place] [address]    Culture ESCHERICHIA COLI (A)  Final   Report Status PENDING  Incomplete  Blood Culture ID Panel (Reflexed)     Status: Abnormal   Collection Time: 08/21/21  1:37 PM  Result Value Ref Range Status   Enterococcus faecalis NOT DETECTED NOT DETECTED Final   Enterococcus Faecium NOT DETECTED NOT DETECTED Final   Listeria monocytogenes NOT DETECTED NOT DETECTED Final   Staphylococcus species NOT DETECTED NOT DETECTED Final   Staphylococcus aureus (BCID) NOT DETECTED NOT DETECTED Final   Staphylococcus epidermidis NOT DETECTED NOT DETECTED Final   Staphylococcus lugdunensis NOT DETECTED NOT DETECTED Final   Streptococcus species NOT DETECTED NOT DETECTED Final   Streptococcus agalactiae NOT DETECTED NOT DETECTED Final   Streptococcus pneumoniae NOT DETECTED NOT DETECTED Final   Streptococcus pyogenes NOT DETECTED NOT DETECTED Final   A.calcoaceticus-baumannii NOT DETECTED NOT DETECTED Final   Bacteroides fragilis NOT DETECTED NOT DETECTED Final   Enterobacterales DETECTED (A) NOT DETECTED Final    Comment: Enterobacterales represent a large order of gram negative bacteria, not a single organism. CRITICAL RESULT CALLED TO, READ BACK BY AND VERIFIED WITH: PHARMD JAMES LEDFORD 08/22/21'@6'$ :01 BY TW     Enterobacter cloacae complex NOT DETECTED NOT DETECTED Final   Escherichia coli DETECTED (A) NOT DETECTED Final    Comment: CRITICAL RESULT CALLED TO, READ BACK BY AND VERIFIED WITH: PHARMD JAMES LEDFORD 08/22/21'@6'$ :01 BY TW    Klebsiella aerogenes NOT DETECTED NOT DETECTED Final   Klebsiella oxytoca NOT DETECTED NOT DETECTED Final   Klebsiella pneumoniae NOT DETECTED NOT DETECTED Final   Proteus species NOT DETECTED NOT DETECTED Final   Salmonella species NOT DETECTED NOT DETECTED Final   Serratia marcescens NOT DETECTED NOT DETECTED Final   Haemophilus influenzae NOT DETECTED NOT DETECTED Final   Neisseria meningitidis NOT DETECTED NOT DETECTED Final   Pseudomonas aeruginosa NOT DETECTED NOT DETECTED Final   Stenotrophomonas maltophilia NOT DETECTED NOT DETECTED Final   Candida albicans NOT DETECTED NOT DETECTED Final   Candida auris NOT DETECTED NOT DETECTED Final   Candida glabrata NOT DETECTED NOT DETECTED Final   Candida krusei NOT DETECTED NOT DETECTED Final   Candida parapsilosis NOT DETECTED NOT DETECTED Final   Candida tropicalis NOT DETECTED NOT DETECTED Final   Cryptococcus neoformans/gattii NOT DETECTED NOT DETECTED Final   CTX-M ESBL NOT DETECTED NOT DETECTED Final   Carbapenem resistance IMP NOT DETECTED NOT DETECTED Final   Carbapenem resistance KPC NOT DETECTED NOT DETECTED Final   Carbapenem resistance NDM NOT DETECTED NOT DETECTED Final   Carbapenem resist OXA 48 LIKE NOT DETECTED NOT DETECTED Final   Carbapenem resistance VIM NOT DETECTED NOT DETECTED Final    Comment: Performed at [facility], [address], [place], [place] [address]  Urine Culture     Status: None (Preliminary result)  Collection Time: 08/21/21  3:53 PM   Specimen: Urine, Clean Catch  Result Value Ref Range Status   Specimen Description   Final    URINE, CLEAN CATCH Performed at [facility], [address], [place], [place] [address]    Special Requests   Final    NONE Performed  at [facility], [address] [place], [place] [address]    Culture   Final    CULTURE REINCUBATED FOR BETTER GROWTH Performed at [facility], [place] [address], [place], [place] [address]    Report Status PENDING  Incomplete   Iona Beard Internal Medicine, PGY2 Pager [phone] 08/23/2021 9:05 AM

## 2021-08-23 NOTE — Progress Notes (Signed)
Natalie Castro  ZOX:[id number] DOB: 07-May-1941 DOA: 08/21/2021 PCP: [name], MD    Brief Narrative:  80 year old with a history of complete heart block status post pacemaker placement, HTN, HLD, DM2, PE on chronic anticoagulation, chronic diastolic CHF, recent hospitalization for E. coli bacteremia in 06/2021 due to acute cholecystitis when had percutaneous cholecystostomy placed 7/14, treated in the hospital with IV ceftriaxone and metronidazole, discharged and completed cefdinir and metronidazole, recent biliary drain exchange on 8/2 for malfunction, who presented to the ED with complaints of fever shortness of breath and cough.  In the ED she was found to be hypoxic at 83% on room air.  CXR noted possible infiltrate of the left lower lobe.  Initially admitted for suspected pneumonia and started on IV ceftriaxone and oral azithromycin.  Antibiotics de-escalated to ceftriaxone once blood cultures positive for E. coli in both sets.  Significant Events:  7/7 > 7/17 admit with CP/SOB - found to have E coli bacteremia and acute cholecystitis 7/10 blood cx - E coli 1 of 2  7/12 blood cx no growth 8/27 admit via ER 8/29 ID consulted  Consultants:  Infectious disease Interventional radiology  Code Status: FULL CODE  Antimicrobials:  Rocephin 8/27 > 8/28 Azithromycin 8/27 >  DVT prophylaxis: Apixaban  Subjective: Denies complaints.  As per RN, no acute issues noted.  No pain reported.  Reportedly had a large loose BM this morning.  Assessment & Plan:  Acute hypoxic respiratory failure Initially suspected due to mild effusion, atelectasis and pneumonia.  Saturating at 100% on [place] 3 L/min.  Discussed with RN to wean off oxygen as tolerated for saturations >92%.  As per PT eval, desaturated to 88% on room air at rest and 83% with ambulation and hence qualifies for home oxygen at this point.  Sepsis POA due to LLL pneumonia v/s GB source w/ E coli bacteremia  Continue empiric  antibiotic -met sepsis criteria at presentation due to fever, tachycardia, and tachypnea in presence of bacterial pneumonia - clinically stable presently - no convincing signs of a true PNA so azithromycin discontinued  2 sets of blood cultures confirm E. coli, sensitivities pending.  Urine culture with insignificant growth.   S/p percutaneous cholecystostomy tube exchange by IR on 8/29. CT abdomen and pelvis showed cholecystostomy tube within the gallbladder lumen.  Associated pericholecystic fluid and no other acute findings. Due to recurrent E. coli bacteremia and unclear etiology, ID was consulted who indicated that while her gallbladder and biliary tract can definitely be a source for her recurrent bacteremia, they are also concerned about her permanent pacemaker being infected and getting a TTE.  Continue ceftriaxone for now and repeating blood cultures for clearance.  Acute diastolic CHF exacerbation TTE 07/02/2021 noted EF 55-60% - not grossly volume overloaded on exam - follow weights and Is/Os - hold diuretic for now until intake clearly stable.  Clinically compensated.  Hypokalemia/hypomagnesemia Replace and follow.  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  A1c 7.4 in July.  History of complete heart block status post [facility] Jude PPM Concern regarding PPM being infected.  ID getting TTE.  Chronic normocytic normochromic anemia Stable.  Obesity - Body mass index is 34.05 kg/m.    Family Communication: None at bedside today. Status is: Inpatient  Remains inpatient appropriate because:Inpatient level of care appropriate due to severity of illness  Dispo: The patient is from: Home  Anticipated d/c is to: Home              Patient currently is not medically stable to d/c.   Difficult to place patient No     Objective:  Vitals:   08/23/21 0407  08/23/21 0744 08/23/21 0756 08/23/21 1230  BP: (!) 127/54  (!) 137/55 120/68  Pulse: 65  68 70  Resp: _0 Temp: (!) 97.4 F (36.3 C)  98.3 F (36.8 C) 98.3 F (36.8 C)  TempSrc: Oral  Oral Oral  SpO2: 99% 99% 100% 98%  Weight:      Height:        Examination: General exam: Pleasant elderly female, moderately built and obese lying comfortably propped up in bed without distress.  Was getting nebulization during my visit. Respiratory system: Clear to auscultation. Respiratory effort normal. Cardiovascular system: S1 & S2 heard, RRR. No JVD, murmurs, rubs, gallops or clicks. No pedal edema.  Telemetry personally reviewed: Sinus rhythm and on demand ventricular paced rhythm. Gastrointestinal system: Abdomen is nondistended, soft and nontender. No organomegaly or masses felt. Normal bowel sounds heard.  Right upper quadrant percutaneous biliary drain intact. Central nervous system: Alert and oriented. No focal neurological deficits. Extremities: Symmetric 5 x 5 power. Skin: No rashes, lesions or ulcers Psychiatry: Judgement and insight appear normal. Mood & affect appropriate.    CBC: Recent Labs  Lab 08/21/21 1336 08/22/21 0638 08/23/21 0242  WBC 8.8 7.5 7.9  NEUTROABS 7.1  --   --   HGB 11.3* 9.5* 9.2*  HCT 36.3 31.1* 29.3*  MCV 98.4 96.9 97.3  PLT 222 176 412    Basic Metabolic Panel: Recent Labs  Lab 08/21/21 1336 08/22/21 0638 08/23/21 0242  NA 139 139 136  K 3.6 3.2* 3.5  CL 105 104 103  CO2 _1 GLUCOSE 169* 154* 115*  BUN _2 CREATININE 0.73 0.81 0.69  CALCIUM 8.3* 8.2* 8.1*  MG  --  1.4*  --   PHOS  --  4.2  --    GFR: Estimated Creatinine Clearance: 64.2 mL/min (by C-G formula based on SCr of 0.69 mg/dL).  Liver Function Tests: Recent Labs  Lab 08/18/21 0844 08/21/21 1336 08/22/21 0638 08/23/21 0242  AST 16 16  --  19  ALT 9 13  --  13  ALKPHOS 62 48  --  40  BILITOT 0.7 0.8  --  0.7  PROT 7.1 7.6  --  5.9*  ALBUMIN 3.7 3.4*  2.6* 2.3*       CBG: Recent Labs  Lab 08/22/21 1137 08/22/21 1636 08/22/21 2120 08/23/21 0613 08/23/21 1232  GLUCAP 176* 172* 166* 104* 192*    Recent Results (from the past 240 hour(s))  Culture, blood (routine x 2)     Status: Abnormal (Preliminary result)   Collection Time: 08/21/21  1:29 PM   Specimen: Right Antecubital; Blood  Result Value Ref Range Status   Specimen Description   Final    RIGHT ANTECUBITAL Performed at [facility], [address] [place], [place] [address]    Special Requests   Final    BOTTLES DRAWN AEROBIC AND ANAEROBIC Blood Culture adequate volume Performed at [facility], [address] [place], [place] [address]    Culture  Setup Time   Final    IN BOTH AEROBIC AND ANAEROBIC BOTTLES GRAM NEGATIVE RODS CRITICAL RESULT CALLED TO, READ BACK BY AND VERIFIED WITH: Renold Genta 4709 08/22/2021 COLEMAN,R Performed at [facility], [place] [address] [place], [place] [address]    Culture ESCHERICHIA COLI (A)  Final   Report Status PENDING  Incomplete  Resp Panel by RT-PCR (Flu A&B, Covid) Nasopharyngeal Swab     Status: None   Collection Time: 08/21/21  1:36 PM   Specimen: Nasopharyngeal Swab; Nasopharyngeal(NP) swabs in vial transport medium  Result Value Ref Range Status   SARS Coronavirus 2 by RT PCR NEGATIVE NEGATIVE Final    Comment: (NOTE) SARS-CoV-2 target nucleic acids are NOT DETECTED.  The SARS-CoV-2 RNA is generally detectable in upper respiratory specimens during the acute phase of infection. The lowest concentration of SARS-CoV-2 viral copies this assay can detect is 138 copies/mL. A negative result does not preclude SARS-Cov-2 infection and should not be used as the sole basis for treatment or other patient management decisions. A negative result may occur with  improper specimen collection/handling, submission of specimen other than nasopharyngeal swab, presence of viral mutation(s) within the areas targeted by this assay,  and inadequate number of viral copies(<138 copies/mL). A negative result must be combined with clinical observations, patient history, and epidemiological information. The expected result is Negative.  Fact Sheet for Patients:  EntrepreneurPulse.com.au  Fact Sheet for Healthcare Providers:  IncredibleEmployment.be  This test is no t yet approved or cleared by the [place] FDA and  has been authorized for detection and/or diagnosis of SARS-CoV-2 by FDA under an Emergency Use Authorization (EUA). This EUA will remain  in effect (meaning this test can be used) for the duration of the COVID-19 declaration under Section 564(b)(1) of the Act, 21 U.S.C.section 360bbb-3(b)(1), unless the authorization is terminated  or revoked sooner.       Influenza A by PCR NEGATIVE NEGATIVE Final   Influenza B by PCR NEGATIVE NEGATIVE Final    Comment: (NOTE) The Xpert Xpress SARS-CoV-2/FLU/RSV plus assay is intended as an aid in the diagnosis of influenza from Nasopharyngeal swab specimens and should not be used as a sole basis for treatment. Nasal washings and aspirates are unacceptable for Xpert Xpress SARS-CoV-2/FLU/RSV testing.  Fact Sheet for Patients: EntrepreneurPulse.com.au  Fact Sheet for Healthcare Providers: IncredibleEmployment.be  This test is not yet approved or cleared by the [place] FDA and has been authorized for detection and/or diagnosis of SARS-CoV-2 by FDA under an Emergency Use Authorization (EUA). This EUA will remain in effect (meaning this test can be used) for the duration of the COVID-19 declaration under Section 564(b)(1) of the Act, 21 U.S.C. section 360bbb-3(b)(1), unless the authorization is terminated or revoked.  Performed at [facility], [address], [place], [place] [address]   Culture, blood (routine x 2)     Status: Abnormal (Preliminary result)   Collection Time:  08/21/21  1:37 PM   Specimen: Left Antecubital; Blood  Result Value Ref Range Status   Specimen Description   Final    LEFT ANTECUBITAL Performed at [facility], [address], [place], [place] [address]    Special Requests   Final    BOTTLES DRAWN AEROBIC AND ANAEROBIC Blood Culture adequate volume Performed at [facility], [address], [place], [place] [address]    Culture  Setup Time   Final    AEROBIC AND ANAEROBIC BOTTLE GRAM NEGATIVE RODS CRITICAL RESULT CALLED TO, READ BACK BY AND VERIFIED WITH: [place],E 0134 08/22/2021 COLEMAN,R GRAM STAIN REVIEWED-AGREE WITH RESULT CRITICAL RESULT CALLED TO, READ BACK BY AND VERIFIED WITH: PHARMD JAMES LEDFORD 08/22/21@6 :01 BY TW Performed at [facility],  [address], [place], [place] [address]    Culture ESCHERICHIA COLI (A)  Final   Report Status PENDING  Incomplete  Blood Culture ID Panel (Reflexed)     Status: Abnormal   Collection Time: 08/21/21  1:37 PM  Result Value Ref Range Status   Enterococcus faecalis NOT DETECTED NOT DETECTED Final   Enterococcus Faecium NOT DETECTED NOT DETECTED Final   Listeria monocytogenes NOT DETECTED NOT DETECTED Final   Staphylococcus species NOT DETECTED NOT DETECTED Final   Staphylococcus aureus (BCID) NOT DETECTED NOT DETECTED Final   Staphylococcus epidermidis NOT DETECTED NOT DETECTED Final   Staphylococcus lugdunensis NOT DETECTED NOT DETECTED Final   Streptococcus species NOT DETECTED NOT DETECTED Final   Streptococcus agalactiae NOT DETECTED NOT DETECTED Final   Streptococcus pneumoniae NOT DETECTED NOT DETECTED Final   Streptococcus pyogenes NOT DETECTED NOT DETECTED Final   A.calcoaceticus-baumannii NOT DETECTED NOT DETECTED Final   Bacteroides fragilis NOT DETECTED NOT DETECTED Final   Enterobacterales DETECTED (A) NOT DETECTED Final    Comment: Enterobacterales represent a large order of gram negative bacteria, not a single organism. CRITICAL RESULT CALLED TO, READ BACK BY  AND VERIFIED WITH: PHARMD JAMES LEDFORD 08/22/21@6 :01 BY TW    Enterobacter cloacae complex NOT DETECTED NOT DETECTED Final   Escherichia coli DETECTED (A) NOT DETECTED Final    Comment: CRITICAL RESULT CALLED TO, READ BACK BY AND VERIFIED WITH: PHARMD JAMES LEDFORD 08/22/21@6 :01 BY TW    Klebsiella aerogenes NOT DETECTED NOT DETECTED Final   Klebsiella oxytoca NOT DETECTED NOT DETECTED Final   Klebsiella pneumoniae NOT DETECTED NOT DETECTED Final   Proteus species NOT DETECTED NOT DETECTED Final   Salmonella species NOT DETECTED NOT DETECTED Final   Serratia marcescens NOT DETECTED NOT DETECTED Final   Haemophilus influenzae NOT DETECTED NOT DETECTED Final   Neisseria meningitidis NOT DETECTED NOT DETECTED Final   Pseudomonas aeruginosa NOT DETECTED NOT DETECTED Final   Stenotrophomonas maltophilia NOT DETECTED NOT DETECTED Final   Candida albicans NOT DETECTED NOT DETECTED Final   Candida auris NOT DETECTED NOT DETECTED Final   Candida glabrata NOT DETECTED NOT DETECTED Final   Candida krusei NOT DETECTED NOT DETECTED Final   Candida parapsilosis NOT DETECTED NOT DETECTED Final   Candida tropicalis NOT DETECTED NOT DETECTED Final   Cryptococcus neoformans/gattii NOT DETECTED NOT DETECTED Final   CTX-M ESBL NOT DETECTED NOT DETECTED Final   Carbapenem resistance IMP NOT DETECTED NOT DETECTED Final   Carbapenem resistance KPC NOT DETECTED NOT DETECTED Final   Carbapenem resistance NDM NOT DETECTED NOT DETECTED Final   Carbapenem resist OXA 48 LIKE NOT DETECTED NOT DETECTED Final   Carbapenem resistance VIM NOT DETECTED NOT DETECTED Final    Comment: Performed at [facility], [address] [place], [place] [address]  Urine Culture     Status: Abnormal   Collection Time: 08/21/21  3:53 PM   Specimen: Urine, Clean Catch  Result Value Ref Range Status   Specimen Description   Final    URINE, CLEAN CATCH Performed at [facility], [address] [place], [place] [address]     Special Requests   Final    NONE Performed at [facility], [address], [place], [place] [address]    Culture (A)  Final    30,000 COLONIES/mL MULTIPLE SPECIES PRESENT, SUGGEST RECOLLECTION   Report Status 08/23/2021 FINAL  Final     Scheduled Meds:  apixaban  5 mg Oral BID   guaiFENesin  600  mg Oral BID   hydrALAZINE  50 mg Oral BID   insulin aspart  0-5 Units Subcutaneous QHS   insulin aspart  0-6 Units Subcutaneous TID WC   insulin glargine-yfgn  20 Units Subcutaneous Q2200   lidocaine       losartan  50 mg Oral Daily   metoprolol succinate  50 mg Oral Daily   potassium chloride  10 mEq Oral Daily   prochlorperazine  25 mg Rectal Once   sodium chloride flush  3 mL Intravenous Q12H   sodium chloride flush  3 mL Intravenous Q12H   sodium chloride flush  5 mL Intracatheter Daily   Continuous Infusions:  sodium chloride     cefTRIAXone (ROCEPHIN)  IV 2 g (08/23/21 1258)     LOS: 2 days  Vernell Leep, MD, [place], [facility]. [facility]  To contact the attending provider between 7A-7P or the covering provider during after hours 7P-7A, please log into the web site [email or web address] and access using universal [place] password for that web site. If you do not have the password, please call the hospital operator.

## 2021-08-23 NOTE — Progress Notes (Signed)
  Echocardiogram 2D Echocardiogram has been performed.  Natalie Castro 08/23/2021, 5:49 PM

## 2021-08-23 NOTE — Evaluation (Addendum)
Physical Therapy Evaluation Patient Details Name: Natalie Castro MRN: [id number] DOB: Feb 09, 1941 Today's Date: 08/23/2021   History of Present Illness  Pt is a 80 y.o. female admitted 8/27 with dx of acute respiratory failure with hypoxia due to CHF and L sided PNA.  PMH significant for complete heart block s/p [facility] PPM, HTN, HLD, DM2, history of PE on chronic anticoagulation, and chronic diastolic congestive heart failure.   Clinical Impression  Pt admitted with above diagnosis. PTA pt lived at home with family, mod I mobility household distances using rollator. On eval, pt required min assist bed mobility, min guard assist transfers, and min guard assist ambulation 20' with RW. Gait distance limited by pt need to use BSC. Pt assisted to [facility] where she had episode of loose stool. Total assist with pericare/toilet hygiene. Pt required 3L O2 to maintain SpO2. See below for details. Pt currently with functional limitations due to the deficits listed below (see PT Problem List). Pt will benefit from skilled PT to increase their independence and safety with mobility to allow discharge to the venue listed below.  Pt reports her children will be able to provide 24-hour assist at home.  SATURATION QUALIFICATIONS: (This note is used to comply with regulatory documentation for home oxygen)  Patient Saturations on Room Air at Rest = 88%  Patient Saturations on Room Air while Ambulating = 83%  Patient Saturations on 3 Liters of oxygen while Ambulating = 94%  Please briefly explain why patient needs home oxygen: Pt required 3L supplemental O2 to maintain SpO2 > 90%.      Follow Up Recommendations Home health PT;Supervision/Assistance - 24 hour    Equipment Recommendations  None recommended by PT    Recommendations for Other Services       Precautions / Restrictions Precautions Precautions: Fall;Other (comment) Precaution Comments: watch sats, chole tube in place Restrictions Weight Bearing  Restrictions: No      Mobility  Bed Mobility Overal bed mobility: Needs Assistance Bed Mobility: Supine to Sit     Supine to sit: HOB elevated;Min assist     General bed mobility comments: +rail, increased time    Transfers Overall transfer level: Needs assistance Equipment used: Rolling walker (2 wheeled) Transfers: Sit to/from [facility] Sit to Stand: Min guard Stand pivot transfers: Min guard       General transfer comment: increased time, cues for sequencing  Ambulation/Gait Ambulation/Gait assistance: Min guard Gait Distance (Feet): 20 Feet Assistive device: Rolling walker (2 wheeled) Gait Pattern/deviations: Step-through pattern;Decreased stride length;Trunk flexed Gait velocity: decreased   General Gait Details: min guard for safety. Slow, steady gait with RW.  Stairs            Wheelchair Mobility    Modified Rankin (Stroke Patients Only)       Balance Overall balance assessment: Needs assistance Sitting-balance support: No upper extremity supported;Feet supported Sitting balance-Leahy Scale: Fair     Standing balance support: Bilateral upper extremity supported;During functional activity Standing balance-Leahy Scale: Poor Standing balance comment: reliant on UE support                             Pertinent Vitals/Pain Pain Assessment: No/denies pain    Home Living Family/patient expects to be discharged to:: Private residence Living Arrangements: Children (daughter) Available Help at Discharge: Family;Available 24 hours/day Type of Home: House Home Access: Stairs to enter Entrance Stairs-Rails: Right Entrance Stairs-Number of Steps: 2 Home Layout:  One level Home Equipment: Cane - single point;Walker - 4 wheels;Shower seat;Walker - 2 wheels      Prior Function Level of Independence: Independent with assistive device(s);Needs assistance   Gait / Transfers Assistance Needed: ambulates household distances  with rollator.  ADL's / Homemaking Assistance Needed: Family assists with ADLs as needed.  Comments: Pt living with her daughter, who works from home, until last week. At that time, pt returned to her own house. A second daughter stays with her there, but she works outside the home during the day. Pt was also d/c'd from [place] when she returned to her own house.     Hand Dominance   Dominant Hand: Right    Extremity/Trunk Assessment   Upper Extremity Assessment Upper Extremity Assessment: Generalized weakness    Lower Extremity Assessment Lower Extremity Assessment: Generalized weakness    Cervical / Trunk Assessment Cervical / Trunk Assessment: Kyphotic  Communication   Communication: No difficulties  Cognition Arousal/Alertness: Awake/alert Behavior During Therapy: WFL for tasks assessed/performed Overall Cognitive Status: Within Functional Limits for tasks assessed                                        General Comments General comments (skin integrity, edema, etc.): Mobilized on RA with desat to 83%. Required 3L O2 to maintain SpO2 94% during mobility.    Exercises     Assessment/Plan    PT Assessment Patient needs continued PT services  PT Problem List Decreased strength;Decreased mobility;Decreased activity tolerance;Cardiopulmonary status limiting activity;Decreased balance       PT Treatment Interventions Therapeutic activities;Gait training;Therapeutic exercise;Patient/family education;Balance training;Stair training;Functional mobility training    PT Goals (Current goals can be found in the Care Plan section)  Acute Rehab PT Goals Patient Stated Goal: home PT Goal Formulation: With patient Time For Goal Achievement: 09/06/21 Potential to Achieve Goals: Good    Frequency Min 3X/week   Barriers to discharge        Co-evaluation               AM-PAC PT "6 Clicks" Mobility  Outcome Measure Help needed turning from your back to  your side while in a flat bed without using bedrails?: A Little Help needed moving from lying on your back to sitting on the side of a flat bed without using bedrails?: A Little Help needed moving to and from a bed to a chair (including a wheelchair)?: A Little Help needed standing up from a chair using your arms (e.g., wheelchair or bedside chair)?: A Little Help needed to walk in hospital room?: A Little Help needed climbing 3-5 steps with a railing? : A Lot 6 Click Score: 17    End of Session Equipment Utilized During Treatment: Gait belt;Oxygen Activity Tolerance: Patient tolerated treatment well Patient left: in chair;with call bell/phone within reach;with chair alarm set Nurse Communication: Mobility status PT Visit Diagnosis: Muscle weakness (generalized) (M62.81)    Time: [id number] PT Time Calculation (min) (ACUTE ONLY): 23 min   Charges:   PT Evaluation $PT Eval Moderate Complexity: 1 Mod PT Treatments $Therapeutic Activity: 8-22 mins        Lorrin Goodell, PT  Office # [phone] Pager [phone]   Lorriane Shire 08/23/2021, 10:25 AM

## 2021-08-23 NOTE — Procedures (Signed)
Interventional Radiology Procedure:   Indications: Cholecystostomy care  Procedure: Cholecystostomy tube exchange  Findings: New 12 Fr drain in gallbladder.  Cystic duct and CBD are patent  Complications: No immediate complications noted.     EBL: Minimal  Plan: Keep tube to gravity drainage for now but need to discuss capping trial with General Surgery.   Natalie Castro R. Anselm Pancoast, MD  Pager: [phone]

## 2021-08-24 ENCOUNTER — Ambulatory Visit: Payer: Medicare Other | Admitting: "Endocrinology

## 2021-08-24 ENCOUNTER — Ambulatory Visit: Payer: Medicare Other | Admitting: Nutrition

## 2021-08-24 DIAGNOSIS — R7881 Bacteremia: Secondary | ICD-10-CM | POA: Diagnosis not present

## 2021-08-24 DIAGNOSIS — J9601 Acute respiratory failure with hypoxia: Secondary | ICD-10-CM | POA: Diagnosis not present

## 2021-08-24 LAB — CULTURE, BLOOD (ROUTINE X 2)
Special Requests: ADEQUATE
Special Requests: ADEQUATE

## 2021-08-24 LAB — ECHOCARDIOGRAM COMPLETE
AR max vel: 1.3 cm2
AV Area VTI: 1.34 cm2
AV Area mean vel: 1.27 cm2
AV Mean grad: 5 mmHg
AV Peak grad: 9.1 mmHg
Ao pk vel: 1.51 m/s
Area-P 1/2: 3.37 cm2
Height: 65 in
MV M vel: 5.27 m/s
MV Peak grad: 111.1 mmHg
MV VTI: 1.11 cm2
Radius: 0.5 cm
S' Lateral: 3.4 cm
Weight: 3273.39 oz

## 2021-08-24 LAB — GLUCOSE, CAPILLARY
Glucose-Capillary: 113 mg/dL — ABNORMAL HIGH (ref 70–99)
Glucose-Capillary: 186 mg/dL — ABNORMAL HIGH (ref 70–99)
Glucose-Capillary: 161 mg/dL — ABNORMAL HIGH (ref 70–99)
Glucose-Capillary: 173 mg/dL — ABNORMAL HIGH (ref 70–99)

## 2021-08-24 LAB — MAGNESIUM: Magnesium: 2.3 mg/dL (ref 1.7–2.4)

## 2021-08-24 MED ORDER — CEFAZOLIN SODIUM-DEXTROSE 2-4 GM/100ML-% IV SOLN
2.0000 g | Freq: Three times a day (TID) | INTRAVENOUS | Status: DC
  Administered 2021-08-24 – 2021-08-27 (×9): 2 g via INTRAVENOUS
  Filled 2021-08-24 (×12): qty 100

## 2021-08-24 MED ORDER — FUROSEMIDE 40 MG PO TABS
40.0000 mg | ORAL_TABLET | Freq: Every day | ORAL | Status: DC
  Administered 2021-08-24 – 2021-08-28 (×5): 40 mg via ORAL
  Filled 2021-08-24 (×5): qty 1

## 2021-08-24 NOTE — Progress Notes (Signed)
Patient ID: Natalie Castro, female   DOB: [birth year], 80 y.o.   MRN: [id number] Patient with no ab pain, e coli bacteremia and pna.  Abdomen not tender, drain in place Cystic duct and cbd are patent as of yesterday I doubt this is source of infection currently At some point lap chole is consideration but not with pna right now If continues to improve likely could cap drain this week to see how she does while in hospital

## 2021-08-24 NOTE — Progress Notes (Signed)
Physical Therapy Treatment Patient Details Name: Natalie Castro MRN: [id number] DOB: 1941/05/23 Today's Date: 08/24/2021    History of Present Illness Pt is a 80 y.o. female admitted 08/21/21 with acute hypoxic respiratory failure secondary to CHF, PNA. Of note, recent hospitalization for E. coli bacteremia (06/2021) due to acute cholecystitis s/p percutaneous cholecystostomy placed 07/08/21. PMH includes CHB s/p pacemaker, HTN, DM2, PE (on chronic anticoagulation), CHF.   PT Comments    Pt progressing with mobility. Today's session focused on transfer and gait training with rollator; pt moving well with intermittent min guard for balance, requires frequent seated rest breaks during ambulation secondary to fatigue and SOB. Pt remains limited by generalized weakness, decreased activity tolerance, and impaired balance strategies/postural reactions. Continue to recommend HHPT to maximize functional mobility and independence upon return home.  SATURATION QUALIFICATIONS:  SpO2 on Room Air at Rest = 97% SpO2 on Room Air while Ambulating = 94%   Follow Up Recommendations  Home health PT;Supervision for mobility/OOB     Equipment Recommendations  None recommended by PT    Recommendations for Other Services       Precautions / Restrictions Precautions Precautions: Fall;Other (comment) Precaution Comments: R-side perc chole tube; watch SpO2 Restrictions Weight Bearing Restrictions: No    Mobility  Bed Mobility               General bed mobility comments: Received sitting in recliner    Transfers Overall transfer level: Needs assistance Equipment used: 4-wheeled walker Transfers: Sit to/from Stand Sit to Stand: Supervision;Min guard         General transfer comment: Multiple sit<>stands from recliner and rollator seat with min guard to supervision for safety; pt with good awareness to lock rollator brakes and scoot rollator against wall prior to  sitting  Ambulation/Gait Ambulation/Gait assistance: Min guard Gait Distance (Feet): 76 Feet (+ 36' + 74' + 52' + 82' + 76') Assistive device: 4-wheeled walker Gait Pattern/deviations: Step-through pattern;Decreased stride length;Trunk flexed Gait velocity: Decreased   General Gait Details: Slow, steady gait with rollator and intermittent min guard for balance; multiple seated rest breaks secondary to SOB and fatigue; SpO2 >/94% on RA   Stairs             Wheelchair Mobility    Modified Rankin (Stroke Patients Only)       Balance Overall balance assessment: Needs assistance Sitting-balance support: No upper extremity supported;Feet supported Sitting balance-Leahy Scale: Good     Standing balance support: Bilateral upper extremity supported;During functional activity;No upper extremity supported Standing balance-Leahy Scale: Fair Standing balance comment: can static stand without UE support, static/dynamic stability improved with rollator                            Cognition Arousal/Alertness: Awake/alert Behavior During Therapy: WFL for tasks assessed/performed Overall Cognitive Status: Within Functional Limits for tasks assessed                                        Exercises      General Comments General comments (skin integrity, edema, etc.): SpO2 99% on 2L O2; maintaining >/94% on RA with ambulation      Pertinent Vitals/Pain Pain Assessment: No/denies pain    Home Living  Prior Function            PT Goals (current goals can now be found in the care plan section) Acute Rehab PT Goals Patient Stated Goal: Return home, better breathing Progress towards PT goals: Progressing toward goals    Frequency    Min 3X/week      PT Plan Current plan remains appropriate    Co-evaluation              AM-PAC PT "6 Clicks" Mobility   Outcome Measure  Help needed turning from your back  to your side while in a flat bed without using bedrails?: None Help needed moving from lying on your back to sitting on the side of a flat bed without using bedrails?: A Little Help needed moving to and from a bed to a chair (including a wheelchair)?: A Little Help needed standing up from a chair using your arms (e.g., wheelchair or bedside chair)?: A Little Help needed to walk in hospital room?: A Little Help needed climbing 3-5 steps with a railing? : A Lot 6 Click Score: 18    End of Session Equipment Utilized During Treatment: Gait belt;Oxygen Activity Tolerance: Patient tolerated treatment well Patient left: in chair;with call bell/phone within reach;with chair alarm set Nurse Communication: Mobility status PT Visit Diagnosis: Muscle weakness (generalized) (M62.81)     Time: [id number] PT Time Calculation (min) (ACUTE ONLY): 36 min  Charges:  $Gait Training: 8-22 mins $Therapeutic Activity: 8-22 mins                     Mabeline Caras, PT, DPT Acute Rehabilitation Services  Pager [phone] Office [place] 08/24/2021, 5:32 PM

## 2021-08-24 NOTE — Progress Notes (Addendum)
Natalie Castro  MMH:[id number] DOB: Jul 28, 1941 DOA: 08/21/2021 PCP: [name], MD    Brief Narrative:  80 year old with a history of complete heart block status post pacemaker placement, HTN, HLD, DM2, PE on chronic anticoagulation, chronic diastolic CHF, recent hospitalization for E. coli bacteremia in 06/2021 due to acute cholecystitis when had percutaneous cholecystostomy placed 7/14, treated in the hospital with IV ceftriaxone and metronidazole, discharged and completed cefdinir and metronidazole, recent biliary drain exchange on 8/2 for malfunction, who presented to the ED with complaints of fever shortness of breath and cough.  In the ED she was found to be hypoxic at 83% on room air.  CXR noted possible infiltrate of the left lower lobe.  Initially admitted for suspected pneumonia and started on IV ceftriaxone and oral azithromycin.  Antibiotics de-escalated to ceftriaxone once blood cultures positive for E. coli in both sets.  Due to recurrent bacteremia of unclear source, ID was consulted.  Significant Events:  7/7 > 7/17 admit with CP/SOB - found to have E coli bacteremia and acute cholecystitis 7/10 blood cx - E coli 1 of 2  7/12 blood cx no growth 8/27 admit via ER 8/29 ID consulted  Consultants:  Infectious disease Interventional radiology  Code Status: FULL CODE  Antimicrobials:  Rocephin 8/27 > 8/28 Azithromycin 8/27 >  DVT prophylaxis: Apixaban  Subjective: Denies complaints.  No pain or fever.  No dyspnea.  Assessment & Plan:  Acute hypoxic respiratory failure Initially suspected due to mild effusion, atelectasis and pneumonia.  Saturating at 100% on [place] 3 L/min.  Discussed with RN to wean off oxygen as tolerated for saturations >92%.  As per PT eval 8/29, desaturated to 88% on room air at rest and 83% with ambulation and hence qualifies for home oxygen at this point.  Sepsis POA due to unclear source,?  GB source w/ E coli bacteremia  Met sepsis  criteria at presentation due to fever, tachycardia, and tachypnea in presence of bacterial pneumonia.  No convincing signs of a true PNA so azithromycin discontinued  2 sets of blood cultures confirm E. coli, pansensitive.  Urine culture with insignificant growth.   S/p percutaneous cholecystostomy tube exchange by IR on 8/29. CT abdomen and pelvis showed cholecystostomy tube within the gallbladder lumen.  Associated pericholecystic fluid and no other acute findings. Due to recurrent E. coli bacteremia and unclear etiology, ID was consulted who indicated that while her gallbladder and biliary tract can definitely be a source for her recurrent bacteremia, they are also concerned about her permanent pacemaker being infected and getting a TTE.  Ceftriaxone changed to Cefazolin. 2D echo: LVEF 50%, inferior lateral hypokinesis (can be followed up as outpatient), moderate LVH, grade 2 diastolic dysfunction.  No comment regarding vegetations. As per general surgery follow-up, with the cystic duct and CBD being patent, doubt biliary source of infection.  At some point laparoscopic cholecystectomy will be considered.  Also consideration for capping the drain this week to see how she does while in the hospital Surveillance blood cultures 08/23/2021: Negative to date.  Acute on chronic diastolic CHF exacerbation 2D echo results as above which also showed features of RV pressure/volume overload.  Resumed diuretics which were on hold.  Hypokalemia/hypomagnesemia Replaced.  Follow BMP and magnesium in AM.  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  A1c 7.4 in July.  History of complete heart block status post [facility] PPM  Concern regarding PPM being infected.  2D echo results as noted above.  Patient follows with Dr. Crissie Sickles, EP cardiology.  Chronic normocytic normochromic  anemia Stable.  Obesity - Body mass index is 34.16 kg/m.    Family Communication: I discussed in detail with patient's daughter via phone, updated care and answered all questions. Status is: Inpatient  Remains inpatient appropriate because:Inpatient level of care appropriate due to severity of illness  Dispo: The patient is from: Home              Anticipated d/c is to: Home              Patient currently is not medically stable to d/c.   Difficult to place patient No     Objective:  Vitals:   08/23/21 1959 08/24/21 0611 08/24/21 0812 08/24/21 0814  BP: (!) 138/51 (!) 130/59  (!) 129/52  Pulse: 67 66  73  Resp: $Remo'19 20 20 'xhHhs$ (!) 21  Temp: 99.2 F (37.3 C) 98 F (36.7 C) 98.2 F (36.8 C) 98.2 F (36.8 C)  TempSrc: Oral Oral  Oral  SpO2: 98% 100%  97%  Weight:  93.1 kg    Height:        Examination: General exam: Pleasant elderly female, moderately built and obese lying comfortably propped up in bed without distress.   Respiratory system: Clear to auscultation.  No increased work of breathing. Cardiovascular system: S1 and S2 heard, RRR.  No JVD, murmurs or pedal edema.  Telemetry personally reviewed: Sinus rhythm with BBB morphology.  4 beat NSVT. Gastrointestinal system: Abdomen is nondistended, soft and nontender. No organomegaly or masses felt. Normal bowel sounds heard.  Right upper quadrant percutaneous biliary drain intact. Central nervous system: Alert and oriented. No focal neurological deficits. Extremities: Symmetric 5 x 5 power. Skin: No rashes, lesions or ulcers Psychiatry: Judgement and insight appear normal. Mood & affect appropriate.    CBC: Recent Labs  Lab 08/21/21 1336 08/22/21 0638 08/23/21 0242  WBC 8.8 7.5 7.9  NEUTROABS 7.1  --   --   HGB 11.3* 9.5* 9.2*  HCT 36.3 31.1* 29.3*  MCV 98.4 96.9 97.3  PLT 222 176 753    Basic Metabolic Panel: Recent Labs  Lab 08/21/21 1336 08/22/21 0638 08/23/21 0242 08/23/21 1556 08/24/21 0306  NA  139 139 136  --   --   K 3.6 3.2* 3.5  --   --   CL 105 104 103  --   --   CO2 $Re'26 29 27  'Gjj$ --   --   GLUCOSE 169* 154* 115*  --   --   BUN $Re'9 9 11  'waK$ --   --   CREATININE 0.73 0.81 0.69  --   --   CALCIUM 8.3* 8.2* 8.1*  --   --   MG  --  1.4*  --  1.5* 2.3  PHOS  --  4.2  --   --   --    GFR: Estimated Creatinine Clearance: 64.3 mL/min (by C-G formula based on SCr of 0.69 mg/dL).  Liver Function Tests: Recent Labs  Lab 08/18/21 0844 08/21/21 1336 08/22/21 0638 08/23/21 0242  AST 16 16  --  19  ALT 9 13  --  13  ALKPHOS 62 48  --  40  BILITOT 0.7 0.8  --  0.7  PROT 7.1 7.6  --  5.9*  ALBUMIN 3.7 3.4* 2.6* 2.3*       CBG: Recent Labs  Lab  08/23/21 1232 08/23/21 1557 08/23/21 2129 08/24/21 0609 08/24/21 1101  GLUCAP 192* 166* 186* 113* 186*    Recent Results (from the past 240 hour(s))  Culture, blood (routine x 2)     Status: Abnormal   Collection Time: 08/21/21  1:29 PM   Specimen: Right Antecubital; Blood  Result Value Ref Range Status   Specimen Description   Final    RIGHT ANTECUBITAL Performed at [facility], [address], [place], [place] [address]    Special Requests   Final    BOTTLES DRAWN AEROBIC AND ANAEROBIC Blood Culture adequate volume Performed at [facility], [address] [place], [place] [address]    Culture  Setup Time   Final    IN BOTH AEROBIC AND ANAEROBIC BOTTLES GRAM NEGATIVE RODS CRITICAL RESULT CALLED TO, READ BACK BY AND VERIFIED WITH: Renold Genta 6720 08/22/2021 COLEMAN,R    Culture (A)  Final    ESCHERICHIA COLI SUSCEPTIBILITIES PERFORMED ON PREVIOUS CULTURE WITHIN THE LAST 5 DAYS. Performed at [facility], [place] [address], [place], [place] [address]    Report Status 08/24/2021 FINAL  Final  Resp Panel by RT-PCR (Flu A&B, Covid) Nasopharyngeal Swab     Status: None   Collection Time: 08/21/21  1:36 PM   Specimen: Nasopharyngeal Swab; Nasopharyngeal(NP) swabs in vial transport medium  Result Value Ref Range Status    SARS Coronavirus 2 by RT PCR NEGATIVE NEGATIVE Final    Comment: (NOTE) SARS-CoV-2 target nucleic acids are NOT DETECTED.  The SARS-CoV-2 RNA is generally detectable in upper respiratory specimens during the acute phase of infection. The lowest concentration of SARS-CoV-2 viral copies this assay can detect is 138 copies/mL. A negative result does not preclude SARS-Cov-2 infection and should not be used as the sole basis for treatment or other patient management decisions. A negative result may occur with  improper specimen collection/handling, submission of specimen other than nasopharyngeal swab, presence of viral mutation(s) within the areas targeted by this assay, and inadequate number of viral copies(<138 copies/mL). A negative result must be combined with clinical observations, patient history, and epidemiological information. The expected result is Negative.  Fact Sheet for Patients:  EntrepreneurPulse.com.au  Fact Sheet for Healthcare Providers:  IncredibleEmployment.be  This test is no t yet approved or cleared by the [place] FDA and  has been authorized for detection and/or diagnosis of SARS-CoV-2 by FDA under an Emergency Use Authorization (EUA). This EUA will remain  in effect (meaning this test can be used) for the duration of the COVID-19 declaration under Section 564(b)(1) of the Act, 21 U.S.C.section 360bbb-3(b)(1), unless the authorization is terminated  or revoked sooner.       Influenza A by PCR NEGATIVE NEGATIVE Final   Influenza B by PCR NEGATIVE NEGATIVE Final    Comment: (NOTE) The Xpert Xpress SARS-CoV-2/FLU/RSV plus assay is intended as an aid in the diagnosis of influenza from Nasopharyngeal swab specimens and should not be used as a sole basis for treatment. Nasal washings and aspirates are unacceptable for Xpert Xpress SARS-CoV-2/FLU/RSV testing.  Fact Sheet for  Patients: EntrepreneurPulse.com.au  Fact Sheet for Healthcare Providers: IncredibleEmployment.be  This test is not yet approved or cleared by the [place] FDA and has been authorized for detection and/or diagnosis of SARS-CoV-2 by FDA under an Emergency Use Authorization (EUA). This EUA will remain in effect (meaning this test can be used) for the duration of the COVID-19 declaration under Section 564(b)(1) of the Act, 21 U.S.C. section 360bbb-3(b)(1), unless the  authorization is terminated or revoked.  Performed at [facility], [address], [place], [place] [address]   Culture, blood (routine x 2)     Status: Abnormal   Collection Time: 08/21/21  1:37 PM   Specimen: Left Antecubital; Blood  Result Value Ref Range Status   Specimen Description   Final    LEFT ANTECUBITAL Performed at [facility], [address] [place], [place] [address]    Special Requests   Final    BOTTLES DRAWN AEROBIC AND ANAEROBIC Blood Culture adequate volume Performed at [facility], [address] [place], [place] [address]    Culture  Setup Time   Final    AEROBIC AND ANAEROBIC BOTTLE GRAM NEGATIVE RODS CRITICAL RESULT CALLED TO, READ BACK BY AND VERIFIED WITH: [place],E 0134 08/22/2021 COLEMAN,R GRAM STAIN REVIEWED-AGREE WITH RESULT CRITICAL RESULT CALLED TO, READ BACK BY AND VERIFIED WITH: PHARMD JAMES LEDFORD 08/22/21@6 :01 BY TW Performed at [facility], [place] [address] [place], [place] [address]    Culture ESCHERICHIA COLI (A)  Final   Report Status 08/24/2021 FINAL  Final   Organism ID, Bacteria ESCHERICHIA COLI  Final      Susceptibility   Escherichia coli - MIC*    AMPICILLIN 8 SENSITIVE Sensitive     CEFAZOLIN <=4 SENSITIVE Sensitive     CEFEPIME <=0.12 SENSITIVE Sensitive     CEFTAZIDIME <=1 SENSITIVE Sensitive     CEFTRIAXONE <=0.25 SENSITIVE Sensitive     CIPROFLOXACIN <=0.25 SENSITIVE Sensitive     GENTAMICIN <=1 SENSITIVE  Sensitive     IMIPENEM <=0.25 SENSITIVE Sensitive     TRIMETH/SULFA <=20 SENSITIVE Sensitive     AMPICILLIN/SULBACTAM 4 SENSITIVE Sensitive     PIP/TAZO <=4 SENSITIVE Sensitive     * ESCHERICHIA COLI  Blood Culture ID Panel (Reflexed)     Status: Abnormal   Collection Time: 08/21/21  1:37 PM  Result Value Ref Range Status   Enterococcus faecalis NOT DETECTED NOT DETECTED Final   Enterococcus Faecium NOT DETECTED NOT DETECTED Final   Listeria monocytogenes NOT DETECTED NOT DETECTED Final   Staphylococcus species NOT DETECTED NOT DETECTED Final   Staphylococcus aureus (BCID) NOT DETECTED NOT DETECTED Final   Staphylococcus epidermidis NOT DETECTED NOT DETECTED Final   Staphylococcus lugdunensis NOT DETECTED NOT DETECTED Final   Streptococcus species NOT DETECTED NOT DETECTED Final   Streptococcus agalactiae NOT DETECTED NOT DETECTED Final   Streptococcus pneumoniae NOT DETECTED NOT DETECTED Final   Streptococcus pyogenes NOT DETECTED NOT DETECTED Final   A.calcoaceticus-baumannii NOT DETECTED NOT DETECTED Final   Bacteroides fragilis NOT DETECTED NOT DETECTED Final   Enterobacterales DETECTED (A) NOT DETECTED Final    Comment: Enterobacterales represent a large order of gram negative bacteria, not a single organism. CRITICAL RESULT CALLED TO, READ BACK BY AND VERIFIED WITH: PHARMD JAMES LEDFORD 08/22/21@6 :01 BY TW    Enterobacter cloacae complex NOT DETECTED NOT DETECTED Final   Escherichia coli DETECTED (A) NOT DETECTED Final    Comment: CRITICAL RESULT CALLED TO, READ BACK BY AND VERIFIED WITH: PHARMD JAMES LEDFORD 08/22/21@6 :01 BY TW    Klebsiella aerogenes NOT DETECTED NOT DETECTED Final   Klebsiella oxytoca NOT DETECTED NOT DETECTED Final   Klebsiella pneumoniae NOT DETECTED NOT DETECTED Final   Proteus species NOT DETECTED NOT DETECTED Final   Salmonella species NOT DETECTED NOT DETECTED Final   Serratia marcescens NOT DETECTED NOT DETECTED Final   Haemophilus influenzae NOT  DETECTED NOT DETECTED Final   Neisseria meningitidis NOT DETECTED  NOT DETECTED Final   Pseudomonas aeruginosa NOT DETECTED NOT DETECTED Final   Stenotrophomonas maltophilia NOT DETECTED NOT DETECTED Final   Candida albicans NOT DETECTED NOT DETECTED Final   Candida auris NOT DETECTED NOT DETECTED Final   Candida glabrata NOT DETECTED NOT DETECTED Final   Candida krusei NOT DETECTED NOT DETECTED Final   Candida parapsilosis NOT DETECTED NOT DETECTED Final   Candida tropicalis NOT DETECTED NOT DETECTED Final   Cryptococcus neoformans/gattii NOT DETECTED NOT DETECTED Final   CTX-M ESBL NOT DETECTED NOT DETECTED Final   Carbapenem resistance IMP NOT DETECTED NOT DETECTED Final   Carbapenem resistance KPC NOT DETECTED NOT DETECTED Final   Carbapenem resistance NDM NOT DETECTED NOT DETECTED Final   Carbapenem resist OXA 48 LIKE NOT DETECTED NOT DETECTED Final   Carbapenem resistance VIM NOT DETECTED NOT DETECTED Final    Comment: Performed at [facility], [address], [place], [place] [address]  Urine Culture     Status: Abnormal   Collection Time: 08/21/21  3:53 PM   Specimen: Urine, Clean Catch  Result Value Ref Range Status   Specimen Description   Final    URINE, CLEAN CATCH Performed at [facility], [address] [place], [place] [address]    Special Requests   Final    NONE Performed at [facility], [address] [place], [place] [address]    Culture (A)  Final    30,000 COLONIES/mL MULTIPLE SPECIES PRESENT, SUGGEST RECOLLECTION   Report Status 08/23/2021 FINAL  Final  Culture, blood (routine x 2)     Status: None (Preliminary result)   Collection Time: 08/23/21 10:22 AM   Specimen: BLOOD RIGHT HAND  Result Value Ref Range Status   Specimen Description BLOOD RIGHT HAND  Final   Special Requests   Final    BOTTLES DRAWN AEROBIC ONLY Blood Culture results may not be optimal due to an inadequate volume of blood received in culture bottles   Culture   Final    NO  GROWTH < 24 HOURS Performed at [facility], [address] [place], [place] [address]    Report Status PENDING  Incomplete  Culture, blood (routine x 2)     Status: None (Preliminary result)   Collection Time: 08/23/21 10:23 AM   Specimen: BLOOD  Result Value Ref Range Status   Specimen Description BLOOD LEFT ANTECUBITAL  Final   Special Requests   Final    BOTTLES DRAWN AEROBIC ONLY Blood Culture results may not be optimal due to an inadequate volume of blood received in culture bottles   Culture   Final    NO GROWTH < 24 HOURS Performed at [facility], [place] [address], [place], [place] [address]    Report Status PENDING  Incomplete     Scheduled Meds:  apixaban  5 mg Oral BID   guaiFENesin  600 mg Oral BID   hydrALAZINE  50 mg Oral BID   insulin aspart  0-5 Units Subcutaneous QHS   insulin aspart  0-6 Units Subcutaneous TID WC   insulin glargine-yfgn  20 Units Subcutaneous Q2200   losartan  50 mg Oral Daily   metoprolol succinate  50 mg Oral Daily   potassium chloride  10 mEq Oral Daily   sodium chloride flush  3 mL Intravenous Q12H   sodium chloride flush  3 mL Intravenous Q12H   sodium chloride flush  5 mL Intracatheter Daily   Continuous Infusions:  sodium chloride  ceFAZolin (ANCEF) IV 2 g (08/24/21 1202)     LOS: 3 days  Vernell Leep, MD, [place], [facility]. [facility]  To contact the attending provider between 7A-7P or the covering provider during after hours 7P-7A, please log into the web site [email or web address] and access using universal [place] password for that web site. If you do not have the password, please call the hospital operator.

## 2021-08-24 NOTE — Progress Notes (Addendum)
ID Brief Note   Chart reviewed, remains afebrile , no leukocytosis   TTE findings noted  Surgery follow up - doubt biliary source for current episode of bacteremia, Lap chole is a consideration in the future; plan for drain cap this week   Plan  Will need a TEE to determine duration of treatment given presence of PMK and recurrent bacteremia Recommend to change ceftriaxone to cefazolin Fu repeat blood cultures for clearance Monitor CBC and BMP on IV abtx  Rosiland Oz, MD Infectious Disease Physician [facility] [address] [place], [place] [address] Phone: [phone]  Fax: [phone]

## 2021-08-24 NOTE — Progress Notes (Signed)
  Mobility Specialist Criteria Algorithm Info.  Mobility Team: [facility] elevated:HOB less than 20 (per patient request) Activity: Ambulated in room; Dangled on edge of bed Range of motion: Active; All extremities Level of assistance: Contact guard assist, steadying assist Assistive device: Front wheel walker Minutes sitting in chair:  Minutes stood: 2 minutes Minutes ambulated: 2 minutes Distance ambulated (ft): 25 ft Mobility response: Tolerated well Bed Position: Semi-fowlers  Agreed to participate in mobility. Required min a + cues for hand placement on railing to get EOB. Stood and ambulated in room 25 feet at min guard with steady gait. Did not want to go any further than room door. Tolerated ambulation well without complaint or incident and was left dangling EOB with all needs met.  08/24/2021 2:38 PM

## 2021-08-25 DIAGNOSIS — I442 Atrioventricular block, complete: Secondary | ICD-10-CM

## 2021-08-25 DIAGNOSIS — I10 Essential (primary) hypertension: Secondary | ICD-10-CM

## 2021-08-25 DIAGNOSIS — E1165 Type 2 diabetes mellitus with hyperglycemia: Secondary | ICD-10-CM

## 2021-08-25 DIAGNOSIS — I5033 Acute on chronic diastolic (congestive) heart failure: Secondary | ICD-10-CM

## 2021-08-25 DIAGNOSIS — J9601 Acute respiratory failure with hypoxia: Secondary | ICD-10-CM | POA: Diagnosis not present

## 2021-08-25 LAB — GLUCOSE, CAPILLARY
Glucose-Capillary: 113 mg/dL — ABNORMAL HIGH (ref 70–99)
Glucose-Capillary: 229 mg/dL — ABNORMAL HIGH (ref 70–99)
Glucose-Capillary: 144 mg/dL — ABNORMAL HIGH (ref 70–99)
Glucose-Capillary: 185 mg/dL — ABNORMAL HIGH (ref 70–99)

## 2021-08-25 LAB — CBC
HCT: 30 % — ABNORMAL LOW (ref 36.0–46.0)
Hemoglobin: 9.6 g/dL — ABNORMAL LOW (ref 12.0–15.0)
MCH: 30.8 pg (ref 26.0–34.0)
MCHC: 32 g/dL (ref 30.0–36.0)
MCV: 96.2 fL (ref 80.0–100.0)
Platelets: 197 10*3/uL (ref 150–400)
RBC: 3.12 MIL/uL — ABNORMAL LOW (ref 3.87–5.11)
RDW: 15.7 % — ABNORMAL HIGH (ref 11.5–15.5)
WBC: 6.1 10*3/uL (ref 4.0–10.5)
nRBC: 0 % (ref 0.0–0.2)

## 2021-08-25 LAB — BASIC METABOLIC PANEL
Anion gap: 7 (ref 5–15)
BUN: 9 mg/dL (ref 8–23)
CO2: 28 mmol/L (ref 22–32)
Calcium: 9 mg/dL (ref 8.9–10.3)
Chloride: 102 mmol/L (ref 98–111)
Creatinine, Ser: 0.61 mg/dL (ref 0.44–1.00)
GFR, Estimated: >60 mL/min (ref >60)
Glucose, Bld: 121 mg/dL — ABNORMAL HIGH (ref 70–99)
Potassium: 4.4 mmol/L (ref 3.5–5.1)
Sodium: 137 mmol/L (ref 135–145)

## 2021-08-25 LAB — MAGNESIUM: Magnesium: 1.9 mg/dL (ref 1.7–2.4)

## 2021-08-25 NOTE — Progress Notes (Signed)
PROGRESS NOTE    Natalie Castro  FBP:[id number] DOB: Sep 11, 1941 DOA: 08/21/2021 PCP: [name], MD   Chief Complaint  Patient presents with   Shortness of Breath    Brief Narrative:  80 year old with a history of complete heart block status post pacemaker placement, HTN, HLD, DM2, PE on chronic anticoagulation, chronic diastolic CHF, recent hospitalization for E. coli bacteremia in 06/2021 due to acute cholecystitis when had percutaneous cholecystostomy placed 7/14, treated in the hospital with IV ceftriaxone and metronidazole, discharged and completed cefdinir and metronidazole, recent biliary drain exchange on 8/2 for malfunction, who presented to the ED with complaints of fever shortness of breath and cough.  In the ED she was found to be hypoxic at 83% on room air.  CXR noted possible infiltrate of the left lower lobe.  Initially admitted for suspected pneumonia and started on IV ceftriaxone and oral azithromycin.  Antibiotics de-escalated to ceftriaxone once blood cultures positive for E. coli in both sets.  Due to recurrent bacteremia of unclear source, ID was consulted.   Significant Events:  7/7 > 7/17 admit with CP/SOB - found to have E coli bacteremia and acute cholecystitis 7/10 blood cx - E coli 1 of 2  7/12 blood cx no growth 8/27 admit via ER 8/29 ID consulted   Assessment & Plan:   Principal Problem:   Acute respiratory failure with hypoxia --- due to CHF and left-sided pneumonia Active Problems:   Essential hypertension, benign   Complete heart block (HCC)/Saint Jude pacemaker   Lt sided CAP (community acquired pneumonia)   Uncontrolled type 2 diabetes mellitus with hyperglycemia ([place])   Acute exacerbation of CHF (congestive heart failure) (HCC)   Acute on chronic Diastolic heart failure with preserved ejection fraction (HFpEF) ([place])  #1 acute hypoxic respiratory failure -Initially felt to be secondary to mild effusion, atelectasis, pneumonia. -Patient  noted to have sats of 100% on 3 L nasal cannula. -Patient noted to have been evaluated by PT on 829 and desatted to 88% on room air at rest and 83% on ambulation and assess qualify patient for home O2. -Patient on IV antibiotics. -Incentive spirometry.  2 sepsis, POA due to unclear source ?  Gallbladder source with E. coli bacteremia -On admission patient met criteria for sepsis with fever, tachycardia, tachypnea in the presence of bacterial pneumonia. -No convincing signs of true pneumonia and as such azithromycin discontinued. -Blood cultures obtained were positive for E. coli noted to be pansensitive. -Urine cultures with insignificant growth -Status post percutaneous cholecystostomy tube exchange by IR 08/23/2021. -CT abdomen and pelvis showed cholecystostomy tube within gallbladder lumen, associated pericholecystic fluid and no other acute findings. -Due to recurrent E. coli bacteremia of unclear source, ID consulted who indicated that while gallbladder and biliary tract could definitely be source of recurrent bacteremia also concerned about patient's permanent pacemaker being infected and recommended getting a 2D echo. -2D echo with EF of 50%, inferior lateral hypokinesis (outpatient follow-up), moderate LVH, grade 2 diastolic dysfunction. -Patient seen by general surgery who also doubt biliary source of patient's infection. -Per general surgery at some point laparoscopic cholecystectomy will be considered. -IV antibiotics narrowed down to IV cefazolin per ID. -Cardiology consulted for TEE scheduled on Friday, 08/27/2021 -General surgery recommending capping drain to see how patient tolerates it. -Repeat blood cultures from 08/23/2021 with no growth to date x2 days. -Per ID.  3.  Acute on chronic diastolic CHF exacerbation -2D echo done showed features of RV pressure/volume overload. -Continue home regimen oral  Lasix.  4.  Hypomagnesemia/hypokalemia -repleated.  Potassium at 4.4.   Magnesium at 1.9.  5.  Unprovoked PE 2017 -Eliquis.  6.  Hypertension -Stable. -Continue Lasix, hydralazine, Cozaar, Toprol-XL.  7.  Uncontrolled diabetes mellitus type 2 -Hemoglobin A1c 7.47 2022 -CBG 113 this morning. -Continue Semglee, SSI.  8.  History of complete heart block status post Saint Jude PPM -Concern for PPM being infected due to recurrent bacteremia. -2D echo with no signs of vegetations noted. -ID recommending TEE, cardiology consulted for TEE to be done 08/27/2021.  9 Chronic normocytic normochromic anemia -Stable   DVT prophylaxis: Eliquis Code Status: Full Family Communication: Updated patient.  No family at bedside Disposition:   Status is: Inpatient  Remains inpatient appropriate because:Inpatient level of care appropriate due to severity of illness  Dispo: The patient is from: Home              Anticipated d/c is to:  TBD              Patient currently is not medically stable to d/c.   Difficult to place patient No       Consultants:  ID:Dr [place] Bali 08/23/2021 Interventional radiology General surgery  Procedures: CT abdomen and pelvis 08/22/2021 Chest x-ray 08/22/2021, 08/21/2021 2D echo 08/23/2021  Antimicrobials:  IV Azithromycin 8/27-8/28/2022 IV Ancef 08/24/2021>>>>> IV Rocephin 08/22/2021>>>> 08/24/2021   Subjective: Laying in bed.  Denies any chest pain.  No shortness of breath.  No abdominal pain.  Tolerating ginger ale at bedside.  States she just does not feel as well as she did yesterday.  Objective: Vitals:   08/24/21 2300 08/25/21 0000 08/25/21 0400 08/25/21 0415  BP:  (!) 143/63 136/63   Pulse:  70 69   Resp:      Temp: 99 F (37.2 C) 98.1 F (36.7 C)  99.4 F (37.4 C)  TempSrc:  Oral  Oral  SpO2:  (!) 86% 90%   Weight:    92.7 kg  Height:        Intake/Output Summary (Last 24 hours) at 08/25/2021 1353 Last data filed at 08/25/2021 1257 Gross per 24 hour  Intake 840 ml  Output 3200 ml  Net -2360 ml   Filed  Weights   08/23/21 0329 08/24/21 0611 08/25/21 0415  Weight: 92.8 kg 93.1 kg 92.7 kg    Examination:  General exam: Appears calm and comfortable  Respiratory system: Clear to auscultation anterior lung fields. Respiratory effort normal. Cardiovascular system: S1 & S2 heard, RRR. No JVD, murmurs, rubs, gallops or clicks. No pedal edema. Gastrointestinal system: Abdomen is nondistended, soft and nontender. No organomegaly or masses felt. Normal bowel sounds heard.  Drain noted right upper quadrant with bilious drainage noted in bag. Central nervous system: Alert and oriented. No focal neurological deficits. Extremities: Symmetric 5 x 5 power. Skin: No rashes, lesions or ulcers Psychiatry: Judgement and insight appear normal. Mood & affect appropriate.     Data Reviewed: I have personally reviewed following labs and imaging studies  CBC: Recent Labs  Lab 08/21/21 1336 08/22/21 0638 08/23/21 0242 08/25/21 0259  WBC 8.8 7.5 7.9 6.1  NEUTROABS 7.1  --   --   --   HGB 11.3* 9.5* 9.2* 9.6*  HCT 36.3 31.1* 29.3* 30.0*  MCV 98.4 96.9 97.3 96.2  PLT 222 176 162 325    Basic Metabolic Panel: Recent Labs  Lab 08/21/21 1336 08/22/21 0638 08/23/21 0242 08/23/21 1556 08/24/21 0306 08/25/21 0259  NA 139 139 136  --   --  137  K 3.6 3.2* 3.5  --   --  4.4  CL 105 104 103  --   --  102  CO2 $Re'26 29 27  'kZr$ --   --  28  GLUCOSE 169* 154* 115*  --   --  121*  BUN $Re'9 9 11  'JBw$ --   --  9  CREATININE 0.73 0.81 0.69  --   --  0.61  CALCIUM 8.3* 8.2* 8.1*  --   --  9.0  MG  --  1.4*  --  1.5* 2.3 1.9  PHOS  --  4.2  --   --   --   --     GFR: Estimated Creatinine Clearance: 64.2 mL/min (by C-G formula based on SCr of 0.61 mg/dL).  Liver Function Tests: Recent Labs  Lab 08/21/21 1336 08/22/21 0638 08/23/21 0242  AST 16  --  19  ALT 13  --  13  ALKPHOS 48  --  40  BILITOT 0.8  --  0.7  PROT 7.6  --  5.9*  ALBUMIN 3.4* 2.6* 2.3*    CBG: Recent Labs  Lab 08/24/21 1101  08/24/21 1539 08/24/21 2149 08/25/21 0605 08/25/21 1122  GLUCAP 186* 161* 173* 113* 229*     Recent Results (from the past 240 hour(s))  Culture, blood (routine x 2)     Status: Abnormal   Collection Time: 08/21/21  1:29 PM   Specimen: Right Antecubital; Blood  Result Value Ref Range Status   Specimen Description   Final    RIGHT ANTECUBITAL Performed at [facility], [address] [place], [place] [address]    Special Requests   Final    BOTTLES DRAWN AEROBIC AND ANAEROBIC Blood Culture adequate volume Performed at [facility], [address], [place], [place] [address]    Culture  Setup Time   Final    IN BOTH AEROBIC AND ANAEROBIC BOTTLES GRAM NEGATIVE RODS CRITICAL RESULT CALLED TO, READ BACK BY AND VERIFIED WITH: [id number],T [id number] 08/22/2021 COLEMAN,R    Culture (A)  Final    ESCHERICHIA COLI SUSCEPTIBILITIES PERFORMED ON PREVIOUS CULTURE WITHIN THE LAST 5 DAYS. Performed at [facility], [place] [address] [place], [place] [address]    Report Status 08/24/2021 FINAL  Final  Resp Panel by RT-PCR (Flu A&B, Covid) Nasopharyngeal Swab     Status: None   Collection Time: 08/21/21  1:36 PM   Specimen: Nasopharyngeal Swab; Nasopharyngeal(NP) swabs in vial transport medium  Result Value Ref Range Status   SARS Coronavirus 2 by RT PCR NEGATIVE NEGATIVE Final    Comment: (NOTE) SARS-CoV-2 target nucleic acids are NOT DETECTED.  The SARS-CoV-2 RNA is generally detectable in upper respiratory specimens during the acute phase of infection. The lowest concentration of SARS-CoV-2 viral copies this assay can detect is 138 copies/mL. A negative result does not preclude SARS-Cov-2 infection and should not be used as the sole basis for treatment or other patient management decisions. A negative result may occur with  improper specimen collection/handling, submission of specimen other than nasopharyngeal swab, presence of viral mutation(s) within the areas targeted by this assay,  and inadequate number of viral copies(<138 copies/mL). A negative result must be combined with clinical observations, patient history, and epidemiological information. The expected result is Negative.  Fact Sheet for Patients:  EntrepreneurPulse.com.au  Fact Sheet for Healthcare Providers:  IncredibleEmployment.be  This test is no t yet approved or cleared by the [place] and  has been authorized for  detection and/or diagnosis of SARS-CoV-2 by FDA under an Emergency Use Authorization (EUA). This EUA will remain  in effect (meaning this test can be used) for the duration of the COVID-19 declaration under Section 564(b)(1) of the Act, 21 U.S.C.section 360bbb-3(b)(1), unless the authorization is terminated  or revoked sooner.       Influenza A by PCR NEGATIVE NEGATIVE Final   Influenza B by PCR NEGATIVE NEGATIVE Final    Comment: (NOTE) The Xpert Xpress SARS-CoV-2/FLU/RSV plus assay is intended as an aid in the diagnosis of influenza from Nasopharyngeal swab specimens and should not be used as a sole basis for treatment. Nasal washings and aspirates are unacceptable for Xpert Xpress SARS-CoV-2/FLU/RSV testing.  Fact Sheet for Patients: EntrepreneurPulse.com.au  Fact Sheet for Healthcare Providers: IncredibleEmployment.be  This test is not yet approved or cleared by the [place] FDA and has been authorized for detection and/or diagnosis of SARS-CoV-2 by FDA under an Emergency Use Authorization (EUA). This EUA will remain in effect (meaning this test can be used) for the duration of the COVID-19 declaration under Section 564(b)(1) of the Act, 21 U.S.C. section 360bbb-3(b)(1), unless the authorization is terminated or revoked.  Performed at [facility], [address] [place], [place] [address]   Culture, blood (routine x 2)     Status: Abnormal   Collection Time: 08/21/21  1:37 PM    Specimen: Left Antecubital; Blood  Result Value Ref Range Status   Specimen Description   Final    LEFT ANTECUBITAL Performed at [facility], [address] [place], [place] [address]    Special Requests   Final    BOTTLES DRAWN AEROBIC AND ANAEROBIC Blood Culture adequate volume Performed at [facility], [address] [place], [place] [address]    Culture  Setup Time   Final    AEROBIC AND ANAEROBIC BOTTLE GRAM NEGATIVE RODS CRITICAL RESULT CALLED TO, READ BACK BY AND VERIFIED WITH: [place],E 0134 08/22/2021 COLEMAN,R GRAM STAIN REVIEWED-AGREE WITH RESULT CRITICAL RESULT CALLED TO, READ BACK BY AND VERIFIED WITH: PHARMD JAMES LEDFORD 08/22/21@6 :01 BY TW Performed at [facility], [place] [address] [place], [place] [address]    Culture ESCHERICHIA COLI (A)  Final   Report Status 08/24/2021 FINAL  Final   Organism ID, Bacteria ESCHERICHIA COLI  Final      Susceptibility   Escherichia coli - MIC*    AMPICILLIN 8 SENSITIVE Sensitive     CEFAZOLIN <=4 SENSITIVE Sensitive     CEFEPIME <=0.12 SENSITIVE Sensitive     CEFTAZIDIME <=1 SENSITIVE Sensitive     CEFTRIAXONE <=0.25 SENSITIVE Sensitive     CIPROFLOXACIN <=0.25 SENSITIVE Sensitive     GENTAMICIN <=1 SENSITIVE Sensitive     IMIPENEM <=0.25 SENSITIVE Sensitive     TRIMETH/SULFA <=20 SENSITIVE Sensitive     AMPICILLIN/SULBACTAM 4 SENSITIVE Sensitive     PIP/TAZO <=4 SENSITIVE Sensitive     * ESCHERICHIA COLI  Blood Culture ID Panel (Reflexed)     Status: Abnormal   Collection Time: 08/21/21  1:37 PM  Result Value Ref Range Status   Enterococcus faecalis NOT DETECTED NOT DETECTED Final   Enterococcus Faecium NOT DETECTED NOT DETECTED Final   Listeria monocytogenes NOT DETECTED NOT DETECTED Final   Staphylococcus species NOT DETECTED NOT DETECTED Final   Staphylococcus aureus (BCID) NOT DETECTED NOT DETECTED Final   Staphylococcus epidermidis NOT DETECTED NOT DETECTED Final   Staphylococcus lugdunensis NOT DETECTED NOT  DETECTED Final   Streptococcus species NOT DETECTED NOT DETECTED  Final   Streptococcus agalactiae NOT DETECTED NOT DETECTED Final   Streptococcus pneumoniae NOT DETECTED NOT DETECTED Final   Streptococcus pyogenes NOT DETECTED NOT DETECTED Final   A.calcoaceticus-baumannii NOT DETECTED NOT DETECTED Final   Bacteroides fragilis NOT DETECTED NOT DETECTED Final   Enterobacterales DETECTED (A) NOT DETECTED Final    Comment: Enterobacterales represent a large order of gram negative bacteria, not a single organism. CRITICAL RESULT CALLED TO, READ BACK BY AND VERIFIED WITH: PHARMD JAMES LEDFORD 08/22/21@6 :01 BY TW    Enterobacter cloacae complex NOT DETECTED NOT DETECTED Final   Escherichia coli DETECTED (A) NOT DETECTED Final    Comment: CRITICAL RESULT CALLED TO, READ BACK BY AND VERIFIED WITH: PHARMD JAMES LEDFORD 08/22/21@6 :01 BY TW    Klebsiella aerogenes NOT DETECTED NOT DETECTED Final   Klebsiella oxytoca NOT DETECTED NOT DETECTED Final   Klebsiella pneumoniae NOT DETECTED NOT DETECTED Final   Proteus species NOT DETECTED NOT DETECTED Final   Salmonella species NOT DETECTED NOT DETECTED Final   Serratia marcescens NOT DETECTED NOT DETECTED Final   Haemophilus influenzae NOT DETECTED NOT DETECTED Final   Neisseria meningitidis NOT DETECTED NOT DETECTED Final   Pseudomonas aeruginosa NOT DETECTED NOT DETECTED Final   Stenotrophomonas maltophilia NOT DETECTED NOT DETECTED Final   Candida albicans NOT DETECTED NOT DETECTED Final   Candida auris NOT DETECTED NOT DETECTED Final   Candida glabrata NOT DETECTED NOT DETECTED Final   Candida krusei NOT DETECTED NOT DETECTED Final   Candida parapsilosis NOT DETECTED NOT DETECTED Final   Candida tropicalis NOT DETECTED NOT DETECTED Final   Cryptococcus neoformans/gattii NOT DETECTED NOT DETECTED Final   CTX-M ESBL NOT DETECTED NOT DETECTED Final   Carbapenem resistance IMP NOT DETECTED NOT DETECTED Final   Carbapenem resistance KPC NOT DETECTED  NOT DETECTED Final   Carbapenem resistance NDM NOT DETECTED NOT DETECTED Final   Carbapenem resist OXA 48 LIKE NOT DETECTED NOT DETECTED Final   Carbapenem resistance VIM NOT DETECTED NOT DETECTED Final    Comment: Performed at [facility], [address] [place], [place] [address]  Urine Culture     Status: Abnormal   Collection Time: 08/21/21  3:53 PM   Specimen: Urine, Clean Catch  Result Value Ref Range Status   Specimen Description   Final    URINE, CLEAN CATCH Performed at [facility], [address], [place], [place] [address]    Special Requests   Final    NONE Performed at [facility], [address], [place], [place] [address]    Culture (A)  Final    30,000 COLONIES/mL MULTIPLE SPECIES PRESENT, SUGGEST RECOLLECTION   Report Status 08/23/2021 FINAL  Final  Culture, blood (routine x 2)     Status: None (Preliminary result)   Collection Time: 08/23/21 10:22 AM   Specimen: BLOOD RIGHT HAND  Result Value Ref Range Status   Specimen Description BLOOD RIGHT HAND  Final   Special Requests   Final    BOTTLES DRAWN AEROBIC ONLY Blood Culture results may not be optimal due to an inadequate volume of blood received in culture bottles   Culture   Final    NO GROWTH 2 DAYS Performed at [facility], [place] [address], [place], [place] [address]    Report Status PENDING  Incomplete  Culture, blood (routine x 2)     Status: None (Preliminary result)   Collection Time: 08/23/21 10:23 AM   Specimen: BLOOD  Result Value Ref Range Status  Specimen Description BLOOD LEFT ANTECUBITAL  Final   Special Requests   Final    BOTTLES DRAWN AEROBIC ONLY Blood Culture results may not be optimal due to an inadequate volume of blood received in culture bottles   Culture   Final    NO GROWTH 2 DAYS Performed at [facility], [place] [address] [place], [place] [address]    Report Status PENDING  Incomplete         Radiology Studies: ECHOCARDIOGRAM COMPLETE  Result Date:  08/24/2021    ECHOCARDIOGRAM REPORT   Patient Name:   MEGHA AGNES Date of Exam: 08/23/2021 Medical Rec #:  [id number]    Height:       65.0 in Accession #:    [id number]   Weight:       204.6 lb Date of Birth:  1941/12/23    BSA:          1.997 m Patient Age:    63 years     BP:           140/59 mmHg Patient Gender: F            HR:           70 bpm. Exam Location:  Inpatient Procedure: 2D Echo, Cardiac Doppler and Color Doppler Indications:    Bacteremia  History:        Patient has prior history of Echocardiogram examinations, most                 recent 06/26/2021. Pacemaker; Risk Factors:Hypertension,                 Dyslipidemia and Diabetes. H/o pulmonary embolus.  Sonographer:    Clayton Lefort RDCS (AE) Referring Phys: [id number] ANAND D [place]  1. Left ventricular ejection fraction, by estimation, is 50%. The left ventricle demonstrates regional wall motion abnormalities with inferolateral hypokinesis. 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 Left ventricular diastolic parameters are consistent with Grade II diastolic dysfunction (pseudonormalization).  2. Right ventricular systolic function is normal. The right ventricular size is normal. There is moderately elevated pulmonary artery systolic pressure. The estimated right ventricular systolic pressure is 17.4 mmHg. Mildly D-shaped septum suggestive of  RV pressure/volume overload.  3. Left atrial size was mildly dilated.  4. Right atrial size was mildly dilated.  5. The mitral valve is abnormal. Moderate mitral valve regurgitation. No evidence of mitral stenosis.  6. The tricuspid valve is abnormal. Tricuspid valve regurgitation is moderate, may be triggered by tethering from pacemaker.  7. The aortic valve is tricuspid. Aortic valve regurgitation is not visualized. Mild aortic valve sclerosis is present, with no evidence of aortic valve stenosis.  8. The inferior vena cava is dilated in size with >50% respiratory variability, suggesting right  atrial pressure of 8 mmHg. FINDINGS  Left Ventricle: Left ventricular ejection fraction, by estimation, is 50%. The left ventricle has mildly decreased function. The left ventricle demonstrates regional wall motion abnormalities. The left ventricular internal cavity size was normal in size. 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 Left ventricular diastolic parameters are consistent with Grade II diastolic dysfunction (pseudonormalization). Right Ventricle: The right ventricular size is normal. No increase in right ventricular wall thickness. Right ventricular systolic function is normal. There is moderately elevated pulmonary artery systolic pressure. The tricuspid regurgitant velocity is 3.35 m/s, and with an assumed right atrial pressure of 8 mmHg, the estimated right ventricular systolic pressure is 08.1 mmHg. Left Atrium: Left atrial size was  mildly dilated. Right Atrium: Right atrial size was mildly dilated. Pericardium: There is no evidence of pericardial effusion. Mitral Valve: The mitral valve is abnormal. Moderate mitral valve regurgitation. No evidence of mitral valve stenosis. MV peak gradient, 6.9 mmHg. The mean mitral valve gradient is 3.0 mmHg. Tricuspid Valve: The tricuspid valve is abnormal. Tricuspid valve regurgitation is moderate. Aortic Valve: The aortic valve is tricuspid. Aortic valve regurgitation is not visualized. Mild aortic valve sclerosis is present, with no evidence of aortic valve stenosis. Aortic valve mean gradient measures 5.0 mmHg. Aortic valve peak gradient measures 9.1 mmHg. Aortic valve area, by VTI measures 1.34 cm. Pulmonic Valve: The pulmonic valve was normal in structure. Pulmonic valve regurgitation is not visualized. Aorta: The aortic root is normal in size and structure. Venous: The inferior vena cava is dilated in size with greater than 50% respiratory variability, suggesting right atrial pressure of 8 mmHg. IAS/Shunts: No atrial level shunt detected by color  flow Doppler. Additional Comments: A device lead is visualized in the right ventricle.  LEFT VENTRICLE PLAX 2D LVIDd:         4.60 cm  Diastology LVIDs:         3.40 cm  LV e' medial:    7.51 cm/s LV PW:         1.40 cm  LV E/e' medial:  15.4 LV IVS:        1.70 cm  LV e' lateral:   10.40 cm/s LVOT diam:     1.80 cm  LV E/e' lateral: 11.2 LV SV:         40 LV SV Index:   20 LVOT Area:     2.54 cm  RIGHT VENTRICLE             IVC RV Basal diam:  3.30 cm     IVC diam: 2.30 cm RV S prime:     11.20 cm/s TAPSE (M-mode): 2.6 cm LEFT ATRIUM           Index       RIGHT ATRIUM           Index LA diam:      4.10 cm 2.05 cm/m  RA Area:     17.90 cm LA Vol (A2C): 52.7 ml 26.39 ml/m RA Volume:   52.40 ml  26.24 ml/m LA Vol (A4C): 66.5 ml 33.30 ml/m  AORTIC VALVE AV Area (Vmax):    1.30 cm AV Area (Vmean):   1.27 cm AV Area (VTI):     1.34 cm AV Vmax:           151.00 cm/s AV Vmean:          105.000 cm/s AV VTI:            0.301 m AV Peak Grad:      9.1 mmHg AV Mean Grad:      5.0 mmHg LVOT Vmax:         77.20 cm/s LVOT Vmean:        52.200 cm/s LVOT VTI:          0.158 m LVOT/AV VTI ratio: 0.52  AORTA Ao Root diam: 3.10 cm Ao Asc diam:  3.00 cm MITRAL VALVE                 TRICUSPID VALVE MV Area (PHT): 3.37 cm      TR Peak grad:   44.9 mmHg MV Area VTI:   1.11 cm      TR  Vmax:        335.00 cm/s MV Peak grad:  6.9 mmHg MV Mean grad:  3.0 mmHg      SHUNTS MV Vmax:       1.31 m/s      Systemic VTI:  0.16 m MV Vmean:      73.9 cm/s     Systemic Diam: 1.80 cm MV Decel Time: 225 msec MR Peak grad:    111.1 mmHg MR Mean grad:    68.0 mmHg MR Vmax:         527.00 cm/s MR Vmean:        389.0 cm/s MR PISA:         1.57 cm MR PISA Eff ROA: 9 mm MR PISA Radius:  0.50 cm MV E velocity: 116.00 cm/s MV A velocity: 95.40 cm/s MV E/A ratio:  1.22 Dalton McleanMD Electronically signed by Franki Monte Signature Date/Time: 08/24/2021/8:51:36 AM    Final         Scheduled Meds:  apixaban  5 mg Oral BID   furosemide  40 mg  Oral Daily   guaiFENesin  600 mg Oral BID   hydrALAZINE  50 mg Oral BID   insulin aspart  0-5 Units Subcutaneous QHS   insulin aspart  0-6 Units Subcutaneous TID WC   insulin glargine-yfgn  20 Units Subcutaneous Q2200   losartan  50 mg Oral Daily   metoprolol succinate  50 mg Oral Daily   potassium chloride  10 mEq Oral Daily   sodium chloride flush  3 mL Intravenous Q12H   sodium chloride flush  3 mL Intravenous Q12H   sodium chloride flush  5 mL Intracatheter Daily   Continuous Infusions:  sodium chloride      ceFAZolin (ANCEF) IV 2 g (08/25/21 0643)     LOS: 4 days    Time spent: 35 mins    Irine Seal, MD [facility]   To contact the attending provider between 7A-7P or the covering provider during after hours 7P-7A, please log into the web site [email or web address] and access using universal [place] password for that web site. If you do not have the password, please call the hospital operator.  08/25/2021, 1:53 PM

## 2021-08-25 NOTE — Progress Notes (Addendum)
I have seen and examined the patient. I have personally reviewed the clinical findings, laboratory findings, microbiological data and imaging studies. The assessment and treatment plan was discussed with the Resident Iona Beard.  I agree with her/his recommendations except following additions/corrections.  Continues to be afebrile with no leukocytosis TTE with no mention of vegetations  General surgery following - doubt biliary source for current episode of bacteremia, Lap chole is a consideration in the future; plan for drain cap today.   Vitals  BP 136/63   Pulse 69   Temp 99.4 F (37.4 C) (Oral)   Resp 20   Ht '5\' 5"'$  (1.651 m)   Wt 92.7 kg   SpO2 90%   BMI 34.01 kg/m   Exam- Sitting up in the chair, appears comfortable             Heart and lung sounds WNL , murmur+            Abdomen - soft and non tender, pig tail drain in the RUQ abdomen with minimal light green fluid in the drainage bulb             Extremities - no pedal edema  Plan  Continue cefazolin for now TEE scheduled on 08/27/21 Follow up repeat blood cultures 8/29 for clearance  Determine duration of abtx pending TEE results  Monitor  CBC and BMP on IV abtx   I spent more than 35 minutes for this patient encounter including reviewing data/chart, and coordinating care and >50% direct face to face time providing counseling/discussing diagnostics/treatment plan with patient    Rosiland Oz, [place] [facility] [place] [facility]    Date of Admission:  08/21/2021   Total days of antibiotics 5        Cefazolin 08/24/2021-        Ceftriaxone started 08/21/2021-08/24/2021                                                                                    Azithromycin 08/21/2021-08/22/2021        Reason for Consult: Recurrent e coli bacteremia                   Referring Provider: Modena Jansky, MD Primary Care Provider:  [name], MD   Assessment: Natalie Castro is a 80 y.o. female with a past medical history of complete heart block s/p PPM, HFpEF, T2DM, HTN, HLD, history of PE on apixiban who presented for dyspnea and cough admitted for acute hypoxic respiratory failure and sepsis. She was recently admitted from 07/01/2021-07/11/2021 for e coli bacteremia secondary to acute cholecystitis s/p percutaneous cholecystostomy tube. Blood cultures on 7/10/20222 with pansenstive e coli, repeat cultures on 07/06/2021 without growth.  She completed a 7 day course of cefdinir and metronidazole. Had drain repositioned on 07/27/2021 with drain remaining in place on admission. Blood cultures on 8/27 with recurrent pansensitive e coli, urinary cultures  with insignificant growth. CT abdomen and pelvis with pericholecystic fluid and small bilateral pleural effusions and atelectasis. Had biliary drain replacement on 08/23/2021 with IR with  plan to cap drain today.   This morning patient reports breathing is improved. Feels generally tired this morning, but denies any pain or specific complaints. TTE with EF of 50%, inferolateral hypokineses, rage 2 diastolic dysfunction, moderate LVH, MVR, and TR. Unlikely biliary source of her bacteremia. Remains afebrile on cefazolin started yesterday. Repeat blood cultures on 08/23/2021 with no growth for 48 hours. Will need TEE given permanent pacemaker  Plan: Continue Cefazolin 2 g every 8 hours TEE scheduled for 08/27/2021 Follow blood cultures Monitor CBC and BMP  Principal Problem:   Acute respiratory failure with hypoxia --- due to CHF and left-sided pneumonia Active Problems:   Essential hypertension, benign   Complete heart block (HCC)/[employer] pacemaker   Lt sided CAP (community acquired pneumonia)   Uncontrolled type 2 diabetes mellitus with hyperglycemia (HCC)   Acute exacerbation of CHF (congestive heart failure) (HCC)   Acute on chronic Diastolic heart failure with  preserved ejection fraction (HFpEF) (HCC)    apixaban  5 mg Oral BID   furosemide  40 mg Oral Daily   guaiFENesin  600 mg Oral BID   hydrALAZINE  50 mg Oral BID   insulin aspart  0-5 Units Subcutaneous QHS   insulin aspart  0-6 Units Subcutaneous TID WC   insulin glargine-yfgn  20 Units Subcutaneous Q2200   losartan  50 mg Oral Daily   metoprolol succinate  50 mg Oral Daily   potassium chloride  10 mEq Oral Daily   sodium chloride flush  3 mL Intravenous Q12H   sodium chloride flush  3 mL Intravenous Q12H   sodium chloride flush  5 mL Intracatheter Daily    HPI: Natalie Castro is a 80 y.o. female with a past medical history of complete heart block s/p PPM, HFpEF, T2DM, HTN, HLD, history of PE on apixiban who presented for dyspnea and cough admitted for acute hypoxic respiratory failure and sepsis. Noted to be febrile, with tachypnea, and tachycardia on admission. Started on ceftriaxone and azithromycin for LLL pneumonia. Blood cultures with recurrent E. Coli bacteremia. Recently admitted on 07/01/2021 for acute cholecystitis with e coli bacteremia s/p percutaneous cholecystostomy tube that is still in place. She completed a 7 day course of cefdinir and metronidazole as well. Patient denies any fever, chills, nausea, vomiting, abdominal pain, and urinary symptoms. Denies increase drainage from cholecystostomy tube. No leukocytosis, CMP unremarkable. CT abdomen and pelvis with pericholecystic fluid and small bilateral pleural effusions. Bacterial cultures with recurrent e coli, urinary cultures negative. ID consulted for assistance with recurrent e coli bacteremia.  Review of Systems: Review of Systems  Constitutional:  Positive for malaise/fatigue. Negative for chills and fever.  HENT:  Negative for sinus pain.   Eyes:  Negative for discharge and redness.  Respiratory:  Negative for shortness of breath, wheezing and stridor.   Cardiovascular:  Negative for chest pain and palpitations.   Gastrointestinal:  Negative for abdominal pain, nausea and vomiting.  Genitourinary:  Negative for frequency and urgency.  Musculoskeletal:  Negative for back pain and joint pain.  Skin:  Negative for itching and rash.  Neurological:  Negative for sensory change, weakness and headaches.   Past Medical History:  Diagnosis Date   Acid reflux    Arthritis    Colon adenomas    AGE 18   Complete heart block (HCC)    STJ PPM Dr. 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, long term anticoag with apixaban  Hyperlipidemia    Myoview 03/2021    Myoview 4/22: EF 50, no infarct or ischemia; low risk   Presence of permanent cardiac pacemaker    Type 2 diabetes mellitus (HCC)     Social History   Tobacco Use   Smoking status: Never   Smokeless tobacco: Never  Vaping Use   Vaping Use: Never used  Substance Use Topics   Alcohol use: No    Alcohol/week: 0.0 standard drinks   Drug use: No    Family History  Problem Relation Age of Onset   Heart failure Mother    Hypertension Mother    Heart failure Sister    Hypertension Father    Cancer Sister    Dementia Sister    Colon cancer Neg Hx    Colon polyps Neg Hx    Allergies  Allergen Reactions   Penicillins Hives, Itching and Other (See Comments)    Has tolerated ceftriaxone 7/22 Has patient had a PCN reaction causing immediate rash, facial/tongue/throat swelling, SOB or lightheadedness with hypotension: No Has patient had a PCN reaction causing severe rash involving mucus membranes or skin necrosis: Yes Has patient had a PCN reaction that required hospitalization No Has patient had a PCN reaction occurring within the last 10 years: No If all of the above answers are "NO", then may proceed with Cephalosporin use.     OBJECTIVE: Blood pressure 136/63, pulse 69, temperature 99.4 F (37.4 C), temperature source Oral, resp. rate 20, height '5\' 5"'$  (1.651 m), weight 92.7 kg, SpO2  90 %.  Physical Exam Constitutional:      Appearance: She is well-developed.  HENT:     Head: Normocephalic and atraumatic.  Cardiovascular:     Rate and Rhythm: Normal rate and regular rhythm.  Pulmonary:     Effort: Pulmonary effort is normal.     Breath sounds: Normal breath sounds. No wheezing or rhonchi.  Abdominal:     General: Bowel sounds are normal.     Palpations: Abdomen is soft.  Musculoskeletal:     Cervical back: Normal range of motion and neck supple.     Right lower leg: No edema.     Left lower leg: No edema.  Skin:    General: Skin is warm and dry.  Neurological:     General: No focal deficit present.     Mental Status: She is alert and oriented to person, place, and time.    Lab Results Lab Results  Component Value Date   WBC 6.1 08/25/2021   HGB 9.6 (L) 08/25/2021   HCT 30.0 (L) 08/25/2021   MCV 96.2 08/25/2021   PLT 197 08/25/2021    Lab Results  Component Value Date   CREATININE 0.61 08/25/2021   BUN 9 08/25/2021   NA 137 08/25/2021   K 4.4 08/25/2021   CL 102 08/25/2021   CO2 28 08/25/2021    Lab Results  Component Value Date   ALT 13 08/23/2021   AST 19 08/23/2021   ALKPHOS 40 08/23/2021   BILITOT 0.7 08/23/2021     Microbiology: Recent Results (from the past 240 hour(s))  Culture, blood (routine x 2)     Status: Abnormal   Collection Time: 08/21/21  1:29 PM   Specimen: Right Antecubital; Blood  Result Value Ref Range Status   Specimen Description   Final    RIGHT ANTECUBITAL Performed at [facility], [address], [place], [place] [address]    Special Requests   Final  BOTTLES DRAWN AEROBIC AND ANAEROBIC Blood Culture adequate volume Performed at [facility], [address], [place], [place] [address]    Culture  Setup Time   Final    IN BOTH AEROBIC AND ANAEROBIC BOTTLES GRAM NEGATIVE RODS CRITICAL RESULT CALLED TO, READ BACK BY AND VERIFIED WITH: Renold Genta [id number] 08/22/2021 COLEMAN,R    Culture (A)  Final     ESCHERICHIA COLI SUSCEPTIBILITIES PERFORMED ON PREVIOUS CULTURE WITHIN THE LAST 5 DAYS. Performed at [facility], [place] [address] [place], [place] [address]    Report Status 08/24/2021 FINAL  Final  Resp Panel by RT-PCR (Flu A&B, Covid) Nasopharyngeal Swab     Status: None   Collection Time: 08/21/21  1:36 PM   Specimen: Nasopharyngeal Swab; Nasopharyngeal(NP) swabs in vial transport medium  Result Value Ref Range Status   SARS Coronavirus 2 by RT PCR NEGATIVE NEGATIVE Final    Comment: (NOTE) SARS-CoV-2 target nucleic acids are NOT DETECTED.  The SARS-CoV-2 RNA is generally detectable in upper respiratory specimens during the acute phase of infection. The lowest concentration of SARS-CoV-2 viral copies this assay can detect is 138 copies/mL. A negative result does not preclude SARS-Cov-2 infection and should not be used as the sole basis for treatment or other patient management decisions. A negative result may occur with  improper specimen collection/handling, submission of specimen other than nasopharyngeal swab, presence of viral mutation(s) within the areas targeted by this assay, and inadequate number of viral copies(<138 copies/mL). A negative result must be combined with clinical observations, patient history, and epidemiological information. The expected result is Negative.  Fact Sheet for Patients:  EntrepreneurPulse.com.au  Fact Sheet for Healthcare Providers:  IncredibleEmployment.be  This test is no t yet approved or cleared by the [place] FDA and  has been authorized for detection and/or diagnosis of SARS-CoV-2 by FDA under an Emergency Use Authorization (EUA). This EUA will remain  in effect (meaning this test can be used) for the duration of the COVID-19 declaration under Section 564(b)(1) of the Act, 21 U.S.C.section 360bbb-3(b)(1), unless the authorization is terminated  or revoked sooner.       Influenza A by  PCR NEGATIVE NEGATIVE Final   Influenza B by PCR NEGATIVE NEGATIVE Final    Comment: (NOTE) The Xpert Xpress SARS-CoV-2/FLU/RSV plus assay is intended as an aid in the diagnosis of influenza from Nasopharyngeal swab specimens and should not be used as a sole basis for treatment. Nasal washings and aspirates are unacceptable for Xpert Xpress SARS-CoV-2/FLU/RSV testing.  Fact Sheet for Patients: EntrepreneurPulse.com.au  Fact Sheet for Healthcare Providers: IncredibleEmployment.be  This test is not yet approved or cleared by the [place] FDA and has been authorized for detection and/or diagnosis of SARS-CoV-2 by FDA under an Emergency Use Authorization (EUA). This EUA will remain in effect (meaning this test can be used) for the duration of the COVID-19 declaration under Section 564(b)(1) of the Act, 21 U.S.C. section 360bbb-3(b)(1), unless the authorization is terminated or revoked.  Performed at [facility], [address], [place], [place] [address]   Culture, blood (routine x 2)     Status: Abnormal   Collection Time: 08/21/21  1:37 PM   Specimen: Left Antecubital; Blood  Result Value Ref Range Status   Specimen Description   Final    LEFT ANTECUBITAL Performed at [facility], [address] [place], [place] [address]    Special Requests   Final    BOTTLES DRAWN AEROBIC AND ANAEROBIC Blood Culture adequate volume  Performed at [facility], [address] [place], [place] [address]    Culture  Setup Time   Final    AEROBIC AND ANAEROBIC BOTTLE GRAM NEGATIVE RODS CRITICAL RESULT CALLED TO, READ BACK BY AND VERIFIED WITH: [id number] 08/22/2021 COLEMAN,R GRAM STAIN REVIEWED-AGREE WITH RESULT CRITICAL RESULT CALLED TO, READ BACK BY AND VERIFIED WITH: PHARMD JAMES LEDFORD 08/22/21'@6'$ :01 BY TW Performed at [facility], [place] [address] [place], [place] [address]    Culture ESCHERICHIA COLI (A)  Final   Report Status 08/24/2021  FINAL  Final   Organism ID, Bacteria ESCHERICHIA COLI  Final      Susceptibility   Escherichia coli - MIC*    AMPICILLIN 8 SENSITIVE Sensitive     CEFAZOLIN <=4 SENSITIVE Sensitive     CEFEPIME <=0.12 SENSITIVE Sensitive     CEFTAZIDIME <=1 SENSITIVE Sensitive     CEFTRIAXONE <=0.25 SENSITIVE Sensitive     CIPROFLOXACIN <=0.25 SENSITIVE Sensitive     GENTAMICIN <=1 SENSITIVE Sensitive     IMIPENEM <=0.25 SENSITIVE Sensitive     TRIMETH/SULFA <=20 SENSITIVE Sensitive     AMPICILLIN/SULBACTAM 4 SENSITIVE Sensitive     PIP/TAZO <=4 SENSITIVE Sensitive     * ESCHERICHIA COLI  Blood Culture ID Panel (Reflexed)     Status: Abnormal   Collection Time: 08/21/21  1:37 PM  Result Value Ref Range Status   Enterococcus faecalis NOT DETECTED NOT DETECTED Final   Enterococcus Faecium NOT DETECTED NOT DETECTED Final   Listeria monocytogenes NOT DETECTED NOT DETECTED Final   Staphylococcus species NOT DETECTED NOT DETECTED Final   Staphylococcus aureus (BCID) NOT DETECTED NOT DETECTED Final   Staphylococcus epidermidis NOT DETECTED NOT DETECTED Final   Staphylococcus lugdunensis NOT DETECTED NOT DETECTED Final   Streptococcus species NOT DETECTED NOT DETECTED Final   Streptococcus agalactiae NOT DETECTED NOT DETECTED Final   Streptococcus pneumoniae NOT DETECTED NOT DETECTED Final   Streptococcus pyogenes NOT DETECTED NOT DETECTED Final   A.calcoaceticus-baumannii NOT DETECTED NOT DETECTED Final   Bacteroides fragilis NOT DETECTED NOT DETECTED Final   Enterobacterales DETECTED (A) NOT DETECTED Final    Comment: Enterobacterales represent a large order of gram negative bacteria, not a single organism. CRITICAL RESULT CALLED TO, READ BACK BY AND VERIFIED WITH: PHARMD JAMES LEDFORD 08/22/21'@6'$ :01 BY TW    Enterobacter cloacae complex NOT DETECTED NOT DETECTED Final   Escherichia coli DETECTED (A) NOT DETECTED Final    Comment: CRITICAL RESULT CALLED TO, READ BACK BY AND VERIFIED WITH: PHARMD JAMES  LEDFORD 08/22/21'@6'$ :01 BY TW    Klebsiella aerogenes NOT DETECTED NOT DETECTED Final   Klebsiella oxytoca NOT DETECTED NOT DETECTED Final   Klebsiella pneumoniae NOT DETECTED NOT DETECTED Final   Proteus species NOT DETECTED NOT DETECTED Final   Salmonella species NOT DETECTED NOT DETECTED Final   Serratia marcescens NOT DETECTED NOT DETECTED Final   Haemophilus influenzae NOT DETECTED NOT DETECTED Final   Neisseria meningitidis NOT DETECTED NOT DETECTED Final   Pseudomonas aeruginosa NOT DETECTED NOT DETECTED Final   Stenotrophomonas maltophilia NOT DETECTED NOT DETECTED Final   Candida albicans NOT DETECTED NOT DETECTED Final   Candida auris NOT DETECTED NOT DETECTED Final   Candida glabrata NOT DETECTED NOT DETECTED Final   Candida krusei NOT DETECTED NOT DETECTED Final   Candida parapsilosis NOT DETECTED NOT DETECTED Final   Candida tropicalis NOT DETECTED NOT DETECTED Final   Cryptococcus neoformans/gattii NOT DETECTED NOT DETECTED Final   CTX-M ESBL NOT DETECTED NOT DETECTED Final  Carbapenem resistance IMP NOT DETECTED NOT DETECTED Final   Carbapenem resistance KPC NOT DETECTED NOT DETECTED Final   Carbapenem resistance NDM NOT DETECTED NOT DETECTED Final   Carbapenem resist OXA 48 LIKE NOT DETECTED NOT DETECTED Final   Carbapenem resistance VIM NOT DETECTED NOT DETECTED Final    Comment: Performed at [facility], [place] [address] [place], [place] [address]  Urine Culture     Status: Abnormal   Collection Time: 08/21/21  3:53 PM   Specimen: Urine, Clean Catch  Result Value Ref Range Status   Specimen Description   Final    URINE, CLEAN CATCH Performed at [facility], [address] [place], [place] [address]    Special Requests   Final    NONE Performed at [facility], [address], [place], [place] [address]    Culture (A)  Final    30,000 COLONIES/mL MULTIPLE SPECIES PRESENT, SUGGEST RECOLLECTION   Report Status 08/23/2021 FINAL  Final  Culture, blood  (routine x 2)     Status: None (Preliminary result)   Collection Time: 08/23/21 10:22 AM   Specimen: BLOOD RIGHT HAND  Result Value Ref Range Status   Specimen Description BLOOD RIGHT HAND  Final   Special Requests   Final    BOTTLES DRAWN AEROBIC ONLY Blood Culture results may not be optimal due to an inadequate volume of blood received in culture bottles   Culture   Final    NO GROWTH < 24 HOURS Performed at [facility], [address], [place], [place] [address]    Report Status PENDING  Incomplete  Culture, blood (routine x 2)     Status: None (Preliminary result)   Collection Time: 08/23/21 10:23 AM   Specimen: BLOOD  Result Value Ref Range Status   Specimen Description BLOOD LEFT ANTECUBITAL  Final   Special Requests   Final    BOTTLES DRAWN AEROBIC ONLY Blood Culture results may not be optimal due to an inadequate volume of blood received in culture bottles   Culture   Final    NO GROWTH < 24 HOURS Performed at [facility], [place] [address], [place], [place] [address]    Report Status PENDING  Incomplete   Iona Beard, MD Internal Medicine PGY-2 Pager [phone] 08/25/2021 10:13 AM

## 2021-08-25 NOTE — Progress Notes (Signed)
Physical Therapy Treatment Patient Details Name: Natalie Castro MRN: [id number] DOB: Feb 27, 1941 Today's Date: 08/25/2021    History of Present Illness Pt is a 80 y.o. female admitted 08/21/21 with acute hypoxic respiratory failure secondary to CHF, PNA. ID consulted for assistance with recurrent e coli bacteremia. Potential for TEE 9/2. Of note, recent hospitalization for E. coli bacteremia (06/2021) due to acute cholecystitis s/p percutaneous cholecystostomy placed 07/08/21. PMH includes CHB s/p pacemaker, HTN, DM2, PE (on chronic anticoagulation), CHF.   PT Comments    Pt progressing with mobility, despite increased fatigue. Today's session focused on gait training and ambulation for improving activity tolerance; pt mobilizing with rollator and intermittent min guard for balance. Pt remains limited by generalized weakness, impaired balance and decreased activity tolerance. Will continue to follow acutely to address established goals.  BP 151/60 SpO2 100% on RA   Follow Up Recommendations  Home health PT;Supervision for mobility/OOB     Equipment Recommendations  None recommended by PT    Recommendations for Other Services       Precautions / Restrictions Precautions Precautions: Fall;Other (comment) Precaution Comments: R-side drain Restrictions Weight Bearing Restrictions: No    Mobility  Bed Mobility Overal bed mobility: Modified Independent Bed Mobility: Supine to Sit     Supine to sit: Modified independent (Device/Increase time);HOB elevated          Transfers Overall transfer level: Needs assistance Equipment used: 4-wheeled walker Transfers: Sit to/from Stand Sit to Stand: Supervision         General transfer comment: Multiple sit<>stands from EOB and rollator seat with supervision for safety; pt with good awareness to lock rollator brakes and scoot rollator against wall prior to sitting  Ambulation/Gait Ambulation/Gait assistance: Min  guard;Supervision Gait Distance (Feet): 60 Feet (+ 24' + 32' + 28' + 124') Assistive device: 4-wheeled walker Gait Pattern/deviations: Step-through pattern;Decreased stride length;Trunk flexed Gait velocity: Decreased   General Gait Details: Slow, steady gait with rollator and intermittent min guard for balance; multiple seated rest breaks secondary to SOB and fatigue; noted increased fatigue this session; SpO2 100% on RA   Stairs             Wheelchair Mobility    Modified Rankin (Stroke Patients Only)       Balance Overall balance assessment: Needs assistance Sitting-balance support: No upper extremity supported;Feet supported Sitting balance-Leahy Scale: Good     Standing balance support: Bilateral upper extremity supported;During functional activity;No upper extremity supported Standing balance-Leahy Scale: Fair Standing balance comment: can static stand without UE support, static/dynamic stability improved with rollator                            Cognition Arousal/Alertness: Awake/alert Behavior During Therapy: WFL for tasks assessed/performed Overall Cognitive Status: Within Functional Limits for tasks assessed                                 General Comments: increased fatigue this session      Exercises      General Comments General comments (skin integrity, edema, etc.): BP 151/60, SpO2 100% on RA      Pertinent Vitals/Pain Pain Assessment: Faces Faces Pain Scale: Hurts a little bit Pain Location: generalized Pain Descriptors / Indicators: Tiring Pain Intervention(s): Monitored during session    Home Living  Prior Function            PT Goals (current goals can now be found in the care plan section) Progress towards PT goals: Progressing toward goals    Frequency    Min 3X/week      PT Plan Current plan remains appropriate    Co-evaluation              AM-PAC PT "6  Clicks" Mobility   Outcome Measure  Help needed turning from your back to your side while in a flat bed without using bedrails?: None Help needed moving from lying on your back to sitting on the side of a flat bed without using bedrails?: None Help needed moving to and from a bed to a chair (including a wheelchair)?: A Little Help needed standing up from a chair using your arms (e.g., wheelchair or bedside chair)?: A Little Help needed to walk in hospital room?: A Little Help needed climbing 3-5 steps with a railing? : A Little 6 Click Score: 20    End of Session Equipment Utilized During Treatment: Gait belt;Oxygen Activity Tolerance: Patient tolerated treatment well;Patient limited by fatigue Patient left: in chair;with call bell/phone within reach;with chair alarm set Nurse Communication: Mobility status PT Visit Diagnosis: Muscle weakness (generalized) (M62.81)     Time: [id number] PT Time Calculation (min) (ACUTE ONLY): 32 min  Charges:  $Gait Training: 8-22 mins $Therapeutic Exercise: 8-22 mins                     Mabeline Caras, PT, DPT Acute Rehabilitation Services  Pager [phone] Office [place] 08/25/2021, 5:14 PM

## 2021-08-25 NOTE — Progress Notes (Signed)
Patient ID: Natalie Castro, female   DOB: [birth year], 80 y.o.   MRN: [id number] I think reasonable to cap drain today,let her eat and see how she does with this in hospital.

## 2021-08-25 NOTE — Progress Notes (Signed)
    [facility] has been requested to perform a transesophageal echocardiogram on Natalie Castro for bacteremia.  After careful review of history and examination, the risks and benefits of transesophageal echocardiogram have been explained including risks of esophageal damage, perforation (1:10,000 risk), bleeding, pharyngeal hematoma as well as other potential complications associated with conscious sedation including aspiration, arrhythmia, respiratory failure and death. Alternatives to treatment were discussed, questions were answered. Patient is willing to proceed.   Kathyrn Drown, NP  08/25/2021 4:44 PM

## 2021-08-25 NOTE — TOC Progression Note (Signed)
Transition of Care [facility]) - Progression Note    Patient Details  Name: Natalie Castro MRN: [id number] Date of Birth: February 09, 1941  [facility]) CM/SW Contact  Zenon Mayo, RN Phone Number: 08/25/2021, 8:46 AM  Clinical Narrative:    Patient lives with [name] and she is on a cruise and will not be back until this weekend.  She is active with [facility] for [facility], HHPT, will like to continue with them.  Patient states her daughter was taking care of the drain for her at home and the [facility] would check the drain when she came out.          Expected Discharge Plan and Services           Expected Discharge Date: 07/27/21                                     Social Determinants of Health (SDOH) Interventions    Readmission Risk Interventions Readmission Risk Prevention Plan 07/11/2021  Transportation Screening Complete  PCP or Specialist Appt within 5-7 Days Complete  Home Care Screening Complete  Medication Review (RN CM) Complete  Some recent data might be hidden

## 2021-08-26 DIAGNOSIS — J9601 Acute respiratory failure with hypoxia: Secondary | ICD-10-CM | POA: Diagnosis not present

## 2021-08-26 LAB — CBC
HCT: 31.1 % — ABNORMAL LOW (ref 36.0–46.0)
Hemoglobin: 10 g/dL — ABNORMAL LOW (ref 12.0–15.0)
MCH: 30.5 pg (ref 26.0–34.0)
MCHC: 32.2 g/dL (ref 30.0–36.0)
MCV: 94.8 fL (ref 80.0–100.0)
Platelets: 211 10*3/uL (ref 150–400)
RBC: 3.28 MIL/uL — ABNORMAL LOW (ref 3.87–5.11)
RDW: 15.6 % — ABNORMAL HIGH (ref 11.5–15.5)
WBC: 6.3 10*3/uL (ref 4.0–10.5)
nRBC: 0 % (ref 0.0–0.2)

## 2021-08-26 LAB — GLUCOSE, CAPILLARY
Glucose-Capillary: 123 mg/dL — ABNORMAL HIGH (ref 70–99)
Glucose-Capillary: 234 mg/dL — ABNORMAL HIGH (ref 70–99)
Glucose-Capillary: 231 mg/dL — ABNORMAL HIGH (ref 70–99)
Glucose-Capillary: 199 mg/dL — ABNORMAL HIGH (ref 70–99)

## 2021-08-26 LAB — BASIC METABOLIC PANEL
Anion gap: 8 (ref 5–15)
BUN: 10 mg/dL (ref 8–23)
CO2: 27 mmol/L (ref 22–32)
Calcium: 8.9 mg/dL (ref 8.9–10.3)
Chloride: 102 mmol/L (ref 98–111)
Creatinine, Ser: 0.79 mg/dL (ref 0.44–1.00)
GFR, Estimated: >60 mL/min (ref >60)
Glucose, Bld: 114 mg/dL — ABNORMAL HIGH (ref 70–99)
Potassium: 4 mmol/L (ref 3.5–5.1)
Sodium: 137 mmol/L (ref 135–145)

## 2021-08-26 LAB — MAGNESIUM: Magnesium: 1.6 mg/dL — ABNORMAL LOW (ref 1.7–2.4)

## 2021-08-26 MED ORDER — INSULIN ASPART 100 UNIT/ML IJ SOLN
0.0000 [IU] | Freq: Three times a day (TID) | INTRAMUSCULAR | Status: DC
  Administered 2021-08-26 – 2021-08-28 (×3): 2 [IU] via SUBCUTANEOUS

## 2021-08-26 MED ORDER — SODIUM CHLORIDE 0.9 % IV SOLN: INTRAVENOUS | Status: DC

## 2021-08-26 MED ORDER — MAGNESIUM SULFATE 2 GM/50ML IV SOLN
2.0000 g | Freq: Once | INTRAVENOUS | Status: AC
  Administered 2021-08-26: 2 g via INTRAVENOUS
  Filled 2021-08-26: qty 50

## 2021-08-26 NOTE — Progress Notes (Signed)
Physical Therapy Treatment Patient Details Name: Natalie Castro MRN: [id number] DOB: [birth year] Today's Date: 08/26/2021    History of Present Illness Pt is a 80 y.o. female admitted 08/21/21 with acute hypoxic respiratory failure secondary to CHF, PNA. ID consulted for assistance with recurrent e coli bacteremia. Potential for TEE 9/2. Of note, recent hospitalization for E. coli bacteremia (06/2021) due to acute cholecystitis s/p percutaneous cholecystostomy placed 07/08/21. PMH includes CHB s/p pacemaker, HTN, DM2, PE (on chronic anticoagulation), CHF.   PT Comments    Pt progressing with mobility. Today's session focused on continued gait training and ambulation for activity tolerance. Pt reports primarily limited to ambulating household distances at home. Educ re: activity recommendations, energy conservation strategies (handout provided). Will continue to follow acutely to address established goals.     Follow Up Recommendations  Home health PT;Supervision for mobility/OOB     Equipment Recommendations  None recommended by PT    Recommendations for Other Services       Precautions / Restrictions Precautions Precautions: Fall;Other (comment) Precaution Comments: R-side abdominal pleural drain Restrictions Weight Bearing Restrictions: No    Mobility  Bed Mobility               General bed mobility comments: Received sitting in recliner    Transfers Overall transfer level: Modified independent Equipment used: 4-wheeled walker Transfers: Sit to/from Stand           General transfer comment: Multiple sit<>stands from recliner and rollator seat mod indep with good awareness to lock rollator brakes and push rollator against wall before sitting; only requiring assist for lines  Ambulation/Gait Ambulation/Gait assistance: Supervision Gait Distance (Feet): 340 Feet Assistive device: 4-wheeled walker Gait Pattern/deviations: Step-through pattern;Decreased stride  length;Trunk flexed Gait velocity: Decreased   General Gait Details: Slow, fatigued, but steady gait with rollator and supervision for safety/lines; 4x seated rest breaks, pt reports secondary to fatigue and SOB   Stairs             Wheelchair Mobility    Modified Rankin (Stroke Patients Only)       Balance Overall balance assessment: Needs assistance Sitting-balance support: No upper extremity supported;Feet supported Sitting balance-Leahy Scale: Good     Standing balance support: Bilateral upper extremity supported;During functional activity;No upper extremity supported Standing balance-Leahy Scale: Fair Standing balance comment: can static stand without UE support, static/dynamic stability improved with rollator                            Cognition Arousal/Alertness: Awake/alert Behavior During Therapy: WFL for tasks assessed/performed Overall Cognitive Status: Within Functional Limits for tasks assessed                                 General Comments: increased fatigue this session      Exercises      General Comments General comments (skin integrity, edema, etc.): SpO2 down to 88% on RA with ambulation (unsure if reliable read), quick return to 99% with seated rest (reliable pleth). Increased time educ re: activity recommendations, energy conservation strategies for home (handout provided)      Pertinent Vitals/Pain Pain Assessment: Faces Faces Pain Scale: Hurts a little bit Pain Location: generalized Pain Descriptors / Indicators: Tiring Pain Intervention(s): Monitored during session    Home Living  Prior Function            PT Goals (current goals can now be found in the care plan section) Progress towards PT goals: Progressing toward goals    Frequency    Min 3X/week      PT Plan Current plan remains appropriate    Co-evaluation              AM-PAC PT "6 Clicks" Mobility    Outcome Measure  Help needed turning from your back to your side while in a flat bed without using bedrails?: None Help needed moving from lying on your back to sitting on the side of a flat bed without using bedrails?: None Help needed moving to and from a bed to a chair (including a wheelchair)?: A Little Help needed standing up from a chair using your arms (e.g., wheelchair or bedside chair)?: None Help needed to walk in hospital room?: A Little Help needed climbing 3-5 steps with a railing? : A Little 6 Click Score: 21    End of Session Equipment Utilized During Treatment: Gait belt Activity Tolerance: Patient tolerated treatment well Patient left: in chair;with call bell/phone within reach Nurse Communication: Mobility status PT Visit Diagnosis: Muscle weakness (generalized) (M62.81)     Time: [id number] PT Time Calculation (min) (ACUTE ONLY): 28 min  Charges:  $Gait Training: 8-22 mins $Therapeutic Exercise: 8-22 mins                     Mabeline Caras, PT, DPT Acute Rehabilitation Services  Pager [phone] Office [place] 08/26/2021, 11:40 AM

## 2021-08-26 NOTE — Progress Notes (Signed)
PROGRESS NOTE    Natalie Castro  [id number] DOB: [birth year] DOA: 08/21/2021 PCP: [name], MD   Chief Complain: Shortness of breath  Brief Narrative:  Patient is a 80 year old female with history of complete heart block status post pacemaker placement, hypertension, hyperlipidemia, diabetes type 2, PE on chronic anticoagulation, chronic diastolic CHF, recent hospitalization for E. coli bacteremia in July due to acute cholecystitis after percutaneous cholecystostomy s/p treatment with IV ceftriaxone and Flagyl and was discharged on oral antibiotics, recent biliary drain exchange on 8/2 for malfunction who presented to the emerged part with complaint of fever, shortness of breath, cough.  She was hypoxic in presentation.  Chest x-ray showed possible infiltrate on the left lower lobe.  She was admitted for pneumonia and was started on IV antibiotics.  Blood cultures showed E. coli.  Due to recurrent bacteremia of unclear source, ID was consulted, plan for TEE tomorrow.   Assessment & Plan:   Principal Problem:   Acute respiratory failure with hypoxia --- due to CHF and left-sided pneumonia Active Problems:   Essential hypertension, benign   Complete heart block (HCC)/[employer] pacemaker   Lt sided CAP (community acquired pneumonia)   Uncontrolled type 2 diabetes mellitus with hyperglycemia ([facility])   Acute exacerbation of CHF (congestive heart failure) (HCC)   Acute on chronic Diastolic heart failure with preserved ejection fraction (HFpEF) (HCC)   Recurrent E. coli bacteremia: Unclear source but could be gallbladder.  Patient was hospitalized in July for E. coli bacteremia due to acute cholecystitis after percutaneous cholecystostomy s/p treatment with IV ceftriaxone and Flagyl and was discharged on oral antibiotics, recent biliary drain exchange on 8/2 for malfunction.  Presented again with fever.  She was tachycardic, tachypneic on presentation.  Blood cultures again showed  E. coli.  Urine culture without significant growth. CC status post percutaneous cholecystostomy tube exchange by IR on 07/2971.  CT abdomen/pelvis showed cholecystostomy tube within gallbladder lumen, associated pericholecystic fluid but no other findings.  ID consulted and following.  Patient also has permanent pacemaker which is worrisome. 2D echo echo showed EF of 50%, inferior lateral hypokinesis, grade 2 diastolic dysfunction.  General surgery recommending laparoscopic cholecystectomy at some point.  Currently on cefazolin.  TEE scheduled tomorrow.  Repeat blood cultures sent on 8/29 has not show any growth. Will follow-up with ID about antibiotic duration after TEE  Acute on chronic diastolic congestive heart failure exacerbation: 2D echo had shown features of right ventricular pressure/volume overload.  Continue home regimen of Lasix  Hypomagnesemia/hypokalemia: Being monitored and supplemented  History of PE: On Eliquis  History of hypertension: Current blood pressure stable.  Continue current medications  Diabetes type 2: Hemoglobin A1c 7.4.  Continue current insulin regimen.  Monitor blood sugars  History of complete heart block: Status post pacemaker.  2D echo did not show any vegetations.  TEE to be done on Friday  Chronic normocytic anemia: Currently hemoglobin stable  Debility/deconditioning: PT/OT recommended home health on discharge. Lives with family at home. ambulates with the help of walker        DVT prophylaxis:Eliquis Code Status: Full Family Communication: Called and discussed with daughter on 08/26/21 Status is: Inpatient  Remains inpatient appropriate because:Inpatient level of care appropriate due to severity of illness  Dispo: The patient is from: Home              Anticipated d/c is to: Home likely tomorrow after TEE  Patient currently is not medically stable to d/c.   Difficult to place patient No     Consultants:  ID,cardiology  Procedures:None  Antimicrobials:  Anti-infectives (From admission, onward)    Start     Dose/Rate Route Frequency Ordered Stop   08/24/21 1200  ceFAZolin (ANCEF) IVPB 2g/100 mL premix        2 g 200 mL/hr over 30 Minutes Intravenous Every 8 hours 08/24/21 0903     08/22/21 1445  cefTRIAXone (ROCEPHIN) 2 g in sodium chloride 0.9 % 100 mL IVPB  Status:  Discontinued        2 g 200 mL/hr over 30 Minutes Intravenous Every 24 hours 08/22/21 0602 08/24/21 0903   08/21/21 1445  cefTRIAXone (ROCEPHIN) 2 g in sodium chloride 0.9 % 100 mL IVPB  Status:  Discontinued        2 g 200 mL/hr over 30 Minutes Intravenous Every 24 hours 08/21/21 1433 08/22/21 0602   08/21/21 1445  azithromycin (ZITHROMAX) 500 mg in sodium chloride 0.9 % 250 mL IVPB  Status:  Discontinued        500 mg 250 mL/hr over 60 Minutes Intravenous Every 24 hours 08/21/21 1433 08/22/21 1633       Subjective: Patient seen and examined at the bedside this morning.  Hemodynamically stable.  Comfortable.  Sitting in the chair.  Denies any new complaints today.  Objective: Vitals:   08/25/21 1620 08/25/21 1951 08/26/21 0000 08/26/21 0309  BP:  (!) 144/53 (!) 129/49 (!) 142/50  Pulse:  71  65  Resp:  19  17  Temp: 99.3 F (37.4 C) 99.1 F (37.3 C)  98.3 F (36.8 C)  TempSrc: Oral Oral  Oral  SpO2:  92%  90%  Weight:    89.8 kg  Height:        Intake/Output Summary (Last 24 hours) at 08/26/2021 0737 Last data filed at 08/26/2021 [id number] Gross per 24 hour  Intake 1098.2 ml  Output 2025 ml  Net -926.8 ml   Filed Weights   08/24/21 0611 08/25/21 0415 08/26/21 0309  Weight: 93.1 kg 92.7 kg 89.8 kg    Examination:  General exam: Overall comfortable, not in distress, very pleasant elderly female HEENT: PERRL Respiratory system:  no wheezes or crackles  Cardiovascular system: S1 & S2 heard, RRR.  Gastrointestinal system: Abdomen is nondistended, soft and nontender.  Drain on the right upper quadrant  draining bilious fluid Central nervous system: Alert and oriented Extremities: No edema, no clubbing ,no cyanosis Skin: No rashes, no ulcers,no icterus       Data Reviewed: I have personally reviewed following labs and imaging studies  CBC: Recent Labs  Lab 08/21/21 1336 08/22/21 0638 08/23/21 0242 08/25/21 0259 08/26/21 0333  WBC 8.8 7.5 7.9 6.1 6.3  NEUTROABS 7.1  --   --   --   --   HGB 11.3* 9.5* 9.2* 9.6* 10.0*  HCT 36.3 31.1* 29.3* 30.0* 31.1*  MCV 98.4 96.9 97.3 96.2 94.8  PLT 222 176 162 197 [id number]   Basic Metabolic Panel: Recent Labs  Lab 08/21/21 1336 08/22/21 0638 08/23/21 0242 08/23/21 1556 08/24/21 0306 08/25/21 0259 08/26/21 0333  NA 139 139 136  --   --  137 137  K 3.6 3.2* 3.5  --   --  4.4 4.0  CL 105 104 103  --   --  102 102  CO2 '26 29 27  '$ --   --  28 27  GLUCOSE 169* 154* 115*  --   --  121* 114*  BUN '9 9 11  '$ --   --  9 10  CREATININE 0.73 0.81 0.69  --   --  0.61 0.79  CALCIUM 8.3* 8.2* 8.1*  --   --  9.0 8.9  MG  --  1.4*  --  1.5* 2.3 1.9 1.6*  PHOS  --  4.2  --   --   --   --   --    GFR: Estimated Creatinine Clearance: 63.1 mL/min (by C-G formula based on SCr of 0.79 mg/dL). Liver Function Tests: Recent Labs  Lab 08/21/21 1336 08/22/21 0638 08/23/21 0242  AST 16  --  19  ALT 13  --  13  ALKPHOS 48  --  40  BILITOT 0.8  --  0.7  PROT 7.6  --  5.9*  ALBUMIN 3.4* 2.6* 2.3*   No results for input(s): LIPASE, AMYLASE in the last 168 hours. No results for input(s): AMMONIA in the last 168 hours. Coagulation Profile: No results for input(s): INR, PROTIME in the last 168 hours. Cardiac Enzymes: No results for input(s): CKTOTAL, CKMB, CKMBINDEX, TROPONINI in the last 168 hours. BNP (last 3 results) No results for input(s): PROBNP in the last 8760 hours. HbA1C: No results for input(s): HGBA1C in the last 72 hours. CBG: Recent Labs  Lab 08/25/21 0605 08/25/21 1122 08/25/21 1622 08/25/21 2059 08/26/21 0548  GLUCAP 113* 229* 144*  185* 123*   Lipid Profile: No results for input(s): CHOL, HDL, LDLCALC, TRIG, CHOLHDL, LDLDIRECT in the last 72 hours. Thyroid Function Tests: No results for input(s): TSH, T4TOTAL, FREET4, T3FREE, THYROIDAB in the last 72 hours. Anemia Panel: No results for input(s): VITAMINB12, FOLATE, FERRITIN, TIBC, IRON, RETICCTPCT in the last 72 hours. Sepsis Labs: Recent Labs  Lab 08/21/21 1336 08/21/21 1534  LATICACIDVEN 1.3 1.9    Recent Results (from the past 240 hour(s))  Culture, blood (routine x 2)     Status: Abnormal   Collection Time: 08/21/21  1:29 PM   Specimen: Right Antecubital; Blood  Result Value Ref Range Status   Specimen Description   Final    RIGHT ANTECUBITAL Performed at [facility], [address] [place], [place] [address]    Special Requests   Final    BOTTLES DRAWN AEROBIC AND ANAEROBIC Blood Culture adequate volume Performed at [facility] [place], [name] [address]    Culture  Setup Time   Final    IN BOTH AEROBIC AND ANAEROBIC BOTTLES GRAM NEGATIVE RODS CRITICAL RESULT CALLED TO, READ BACK BY AND VERIFIED WITH: Renold Genta [id number] 08/22/2021 COLEMAN,R    Culture (A)  Final    ESCHERICHIA COLI SUSCEPTIBILITIES PERFORMED ON PREVIOUS CULTURE WITHIN THE LAST 5 DAYS. Performed at [facility], [place] [address], [place], [place] [address]    Report Status 08/24/2021 FINAL  Final  Resp Panel by RT-PCR (Flu A&B, Covid) Nasopharyngeal Swab     Status: None   Collection Time: 08/21/21  1:36 PM   Specimen: Nasopharyngeal Swab; Nasopharyngeal(NP) swabs in vial transport medium  Result Value Ref Range Status   SARS Coronavirus 2 by RT PCR NEGATIVE NEGATIVE Final    Comment: (NOTE) SARS-CoV-2 target nucleic acids are NOT DETECTED.  The SARS-CoV-2 RNA is generally detectable in upper respiratory specimens during the acute phase of infection. The lowest concentration of SARS-CoV-2 viral copies this assay can detect is 138 copies/mL. A negative  result does not preclude SARS-Cov-2 infection and should not be used as the sole basis  for treatment or other patient management decisions. A negative result may occur with  improper specimen collection/handling, submission of specimen other than nasopharyngeal swab, presence of viral mutation(s) within the areas targeted by this assay, and inadequate number of viral copies(<138 copies/mL). A negative result must be combined with clinical observations, patient history, and epidemiological information. The expected result is Negative.  Fact Sheet for Patients:  EntrepreneurPulse.com.au  Fact Sheet for Healthcare Providers:  IncredibleEmployment.be  This test is no t yet approved or cleared by the [place] FDA and  has been authorized for detection and/or diagnosis of SARS-CoV-2 by FDA under an Emergency Use Authorization (EUA). This EUA will remain  in effect (meaning this test can be used) for the duration of the COVID-19 declaration under Section 564(b)(1) of the Act, 21 U.S.C.section 360bbb-3(b)(1), unless the authorization is terminated  or revoked sooner.       Influenza A by PCR NEGATIVE NEGATIVE Final   Influenza B by PCR NEGATIVE NEGATIVE Final    Comment: (NOTE) The Xpert Xpress SARS-CoV-2/FLU/RSV plus assay is intended as an aid in the diagnosis of influenza from Nasopharyngeal swab specimens and should not be used as a sole basis for treatment. Nasal washings and aspirates are unacceptable for Xpert Xpress SARS-CoV-2/FLU/RSV testing.  Fact Sheet for Patients: EntrepreneurPulse.com.au  Fact Sheet for Healthcare Providers: IncredibleEmployment.be  This test is not yet approved or cleared by the [place] FDA and has been authorized for detection and/or diagnosis of SARS-CoV-2 by FDA under an Emergency Use Authorization (EUA). This EUA will remain in effect (meaning this test can be used)  for the duration of the COVID-19 declaration under Section 564(b)(1) of the Act, 21 U.S.C. section 360bbb-3(b)(1), unless the authorization is terminated or revoked.  Performed at [facility], [address], [place], [place] [address]   Culture, blood (routine x 2)     Status: Abnormal   Collection Time: 08/21/21  1:37 PM   Specimen: Left Antecubital; Blood  Result Value Ref Range Status   Specimen Description   Final    LEFT ANTECUBITAL Performed at [facility], [address] [place], [place] [address]    Special Requests   Final    BOTTLES DRAWN AEROBIC AND ANAEROBIC Blood Culture adequate volume Performed at [facility], [address], [place], [place] [address]    Culture  Setup Time   Final    AEROBIC AND ANAEROBIC BOTTLE GRAM NEGATIVE RODS CRITICAL RESULT CALLED TO, READ BACK BY AND VERIFIED WITH: [place],E 0134 08/22/2021 COLEMAN,R GRAM STAIN REVIEWED-AGREE WITH RESULT CRITICAL RESULT CALLED TO, READ BACK BY AND VERIFIED WITH: PHARMD JAMES LEDFORD 08/22/21'@6'$ :01 BY TW Performed at [facility], [place] [address] [place], [place] [address]    Culture ESCHERICHIA COLI (A)  Final   Report Status 08/24/2021 FINAL  Final   Organism ID, Bacteria ESCHERICHIA COLI  Final      Susceptibility   Escherichia coli - MIC*    AMPICILLIN 8 SENSITIVE Sensitive     CEFAZOLIN <=4 SENSITIVE Sensitive     CEFEPIME <=0.12 SENSITIVE Sensitive     CEFTAZIDIME <=1 SENSITIVE Sensitive     CEFTRIAXONE <=0.25 SENSITIVE Sensitive     CIPROFLOXACIN <=0.25 SENSITIVE Sensitive     GENTAMICIN <=1 SENSITIVE Sensitive     IMIPENEM <=0.25 SENSITIVE Sensitive     TRIMETH/SULFA <=20 SENSITIVE Sensitive     AMPICILLIN/SULBACTAM 4 SENSITIVE Sensitive     PIP/TAZO <=4 SENSITIVE Sensitive     * ESCHERICHIA COLI  Blood  Culture ID Panel (Reflexed)     Status: Abnormal   Collection Time: 08/21/21  1:37 PM  Result Value Ref Range Status   Enterococcus faecalis NOT DETECTED NOT DETECTED Final    Enterococcus Faecium NOT DETECTED NOT DETECTED Final   Listeria monocytogenes NOT DETECTED NOT DETECTED Final   Staphylococcus species NOT DETECTED NOT DETECTED Final   Staphylococcus aureus (BCID) NOT DETECTED NOT DETECTED Final   Staphylococcus epidermidis NOT DETECTED NOT DETECTED Final   Staphylococcus lugdunensis NOT DETECTED NOT DETECTED Final   Streptococcus species NOT DETECTED NOT DETECTED Final   Streptococcus agalactiae NOT DETECTED NOT DETECTED Final   Streptococcus pneumoniae NOT DETECTED NOT DETECTED Final   Streptococcus pyogenes NOT DETECTED NOT DETECTED Final   A.calcoaceticus-baumannii NOT DETECTED NOT DETECTED Final   Bacteroides fragilis NOT DETECTED NOT DETECTED Final   Enterobacterales DETECTED (A) NOT DETECTED Final    Comment: Enterobacterales represent a large order of gram negative bacteria, not a single organism. CRITICAL RESULT CALLED TO, READ BACK BY AND VERIFIED WITH: PHARMD JAMES LEDFORD 08/22/21'@6'$ :01 BY TW    Enterobacter cloacae complex NOT DETECTED NOT DETECTED Final   Escherichia coli DETECTED (A) NOT DETECTED Final    Comment: CRITICAL RESULT CALLED TO, READ BACK BY AND VERIFIED WITH: PHARMD JAMES LEDFORD 08/22/21'@6'$ :01 BY TW    Klebsiella aerogenes NOT DETECTED NOT DETECTED Final   Klebsiella oxytoca NOT DETECTED NOT DETECTED Final   Klebsiella pneumoniae NOT DETECTED NOT DETECTED Final   Proteus species NOT DETECTED NOT DETECTED Final   Salmonella species NOT DETECTED NOT DETECTED Final   Serratia marcescens NOT DETECTED NOT DETECTED Final   Haemophilus influenzae NOT DETECTED NOT DETECTED Final   Neisseria meningitidis NOT DETECTED NOT DETECTED Final   Pseudomonas aeruginosa NOT DETECTED NOT DETECTED Final   Stenotrophomonas maltophilia NOT DETECTED NOT DETECTED Final   Candida albicans NOT DETECTED NOT DETECTED Final   Candida auris NOT DETECTED NOT DETECTED Final   Candida glabrata NOT DETECTED NOT DETECTED Final   Candida krusei NOT DETECTED  NOT DETECTED Final   Candida parapsilosis NOT DETECTED NOT DETECTED Final   Candida tropicalis NOT DETECTED NOT DETECTED Final   Cryptococcus neoformans/gattii NOT DETECTED NOT DETECTED Final   CTX-M ESBL NOT DETECTED NOT DETECTED Final   Carbapenem resistance IMP NOT DETECTED NOT DETECTED Final   Carbapenem resistance KPC NOT DETECTED NOT DETECTED Final   Carbapenem resistance NDM NOT DETECTED NOT DETECTED Final   Carbapenem resist OXA 48 LIKE NOT DETECTED NOT DETECTED Final   Carbapenem resistance VIM NOT DETECTED NOT DETECTED Final    Comment: Performed at [facility], [address] [place], [place] [address]  Urine Culture     Status: Abnormal   Collection Time: 08/21/21  3:53 PM   Specimen: Urine, Clean Catch  Result Value Ref Range Status   Specimen Description   Final    URINE, CLEAN CATCH Performed at [facility], [address] [place], [place] [address]    Special Requests   Final    NONE Performed at [facility], [address], [place], [place] [address]    Culture (A)  Final    30,000 COLONIES/mL MULTIPLE SPECIES PRESENT, SUGGEST RECOLLECTION   Report Status 08/23/2021 FINAL  Final  Culture, blood (routine x 2)     Status: None (Preliminary result)   Collection Time: 08/23/21 10:22 AM   Specimen: BLOOD RIGHT HAND  Result Value Ref Range Status   Specimen Description BLOOD RIGHT HAND  Final  Special Requests   Final    BOTTLES DRAWN AEROBIC ONLY Blood Culture results may not be optimal due to an inadequate volume of blood received in culture bottles   Culture   Final    NO GROWTH 2 DAYS Performed at [facility], [place] [address], [place], [place] [address]    Report Status PENDING  Incomplete  Culture, blood (routine x 2)     Status: None (Preliminary result)   Collection Time: 08/23/21 10:23 AM   Specimen: BLOOD  Result Value Ref Range Status   Specimen Description BLOOD LEFT ANTECUBITAL  Final   Special Requests   Final    BOTTLES DRAWN  AEROBIC ONLY Blood Culture results may not be optimal due to an inadequate volume of blood received in culture bottles   Culture   Final    NO GROWTH 2 DAYS Performed at [facility], [place] [address], [place], [place] [address]    Report Status PENDING  Incomplete         Radiology Studies: No results found.      Scheduled Meds:  apixaban  5 mg Oral BID   furosemide  40 mg Oral Daily   guaiFENesin  600 mg Oral BID   hydrALAZINE  50 mg Oral BID   insulin aspart  0-5 Units Subcutaneous QHS   insulin aspart  0-6 Units Subcutaneous TID WC   insulin glargine-yfgn  20 Units Subcutaneous Q2200   losartan  50 mg Oral Daily   metoprolol succinate  50 mg Oral Daily   potassium chloride  10 mEq Oral Daily   sodium chloride flush  3 mL Intravenous Q12H   sodium chloride flush  3 mL Intravenous Q12H   sodium chloride flush  5 mL Intracatheter Daily   Continuous Infusions:  sodium chloride 10 mL/hr at 08/26/21 0633    ceFAZolin (ANCEF) IV 2 g (08/26/21 0634)     LOS: 5 days    Time spent: 25 mins.More than 50% of that time was spent in counseling and/or coordination of care.      Shelly Coss, MD [facility] P9/12/2020, 7:37 AM

## 2021-08-26 NOTE — Care Management Important Message (Signed)
Important Message  Patient Details  Name: Natalie Castro MRN: [id number] Date of Birth: [birth year]   Medicare Important Message Given:  Yes     Shelda Altes 08/26/2021, 9:06 AM

## 2021-08-27 ENCOUNTER — Encounter (HOSPITAL_COMMUNITY): Payer: Self-pay | Admitting: Family Medicine

## 2021-08-27 ENCOUNTER — Inpatient Hospital Stay (HOSPITAL_COMMUNITY): Payer: Medicare Other

## 2021-08-27 ENCOUNTER — Other Ambulatory Visit (HOSPITAL_COMMUNITY): Payer: Self-pay

## 2021-08-27 ENCOUNTER — Inpatient Hospital Stay (HOSPITAL_COMMUNITY): Payer: Medicare Other | Admitting: Anesthesiology

## 2021-08-27 ENCOUNTER — Encounter (HOSPITAL_COMMUNITY)
Admission: EM | Disposition: A | Discharge status: Home Health | Payer: Self-pay | Source: Home / Self Care | Attending: Internal Medicine

## 2021-08-27 DIAGNOSIS — I361 Nonrheumatic tricuspid (valve) insufficiency: Secondary | ICD-10-CM

## 2021-08-27 DIAGNOSIS — R7881 Bacteremia: Secondary | ICD-10-CM

## 2021-08-27 DIAGNOSIS — I340 Nonrheumatic mitral (valve) insufficiency: Secondary | ICD-10-CM

## 2021-08-27 DIAGNOSIS — J9601 Acute respiratory failure with hypoxia: Secondary | ICD-10-CM | POA: Diagnosis not present

## 2021-08-27 HISTORY — PX: TEE WITHOUT CARDIOVERSION: SHX5443

## 2021-08-27 LAB — GLUCOSE, CAPILLARY
Glucose-Capillary: 130 mg/dL — ABNORMAL HIGH (ref 70–99)
Glucose-Capillary: 115 mg/dL — ABNORMAL HIGH (ref 70–99)
Glucose-Capillary: 156 mg/dL — ABNORMAL HIGH (ref 70–99)
Glucose-Capillary: 221 mg/dL — ABNORMAL HIGH (ref 70–99)

## 2021-08-27 LAB — MAGNESIUM: Magnesium: 1.8 mg/dL (ref 1.7–2.4)

## 2021-08-27 SURGERY — ECHOCARDIOGRAM, TRANSESOPHAGEAL
Anesthesia: Monitor Anesthesia Care

## 2021-08-27 MED ORDER — PROPOFOL 500 MG/50ML IV EMUL
INTRAVENOUS | Status: DC | PRN
  Administered 2021-08-27: 30 ug/kg/min via INTRAVENOUS

## 2021-08-27 MED ORDER — CEFADROXIL 500 MG PO CAPS
500.0000 mg | ORAL_CAPSULE | Freq: Two times a day (BID) | ORAL | Status: DC
  Administered 2021-08-28: 500 mg via ORAL
  Filled 2021-08-27 (×3): qty 1

## 2021-08-27 MED ORDER — PROPOFOL 10 MG/ML IV BOLUS
INTRAVENOUS | Status: DC | PRN
  Administered 2021-08-27: 125 ug/kg/min via INTRAVENOUS

## 2021-08-27 NOTE — Plan of Care (Signed)

## 2021-08-27 NOTE — Interval H&P Note (Signed)
History and Physical Interval Note:  08/27/2021 2:04 PM  Natalie Castro  has presented today for surgery, with the diagnosis of BACTEREMIA.  The various methods of treatment have been discussed with the patient and family. After consideration of risks, benefits and other options for treatment, the patient has consented to  Procedure(s): TRANSESOPHAGEAL ECHOCARDIOGRAM (TEE) (N/A) as a surgical intervention.  The patient's history has been reviewed, patient examined, no change in status, stable for surgery.  I have reviewed the patient's chart and labs.  Questions were answered to the patient's satisfaction.     Pixie Casino

## 2021-08-27 NOTE — Progress Notes (Signed)
Echocardiogram Echocardiogram Transesophageal has been performed.  Oneal Deputy Javan Gonzaga RDCS 08/27/2021, 2:53 PM

## 2021-08-27 NOTE — Progress Notes (Signed)
Received consult. Patient leaving room for procedure upon VAST arrival. Fran Lowes, RN VAST

## 2021-08-27 NOTE — Progress Notes (Signed)
PROGRESS NOTE  Natalie Castro [id number] DOB: 06/24/1941 DOA: 08/21/2021 PCP: [name], MD  HPI/Recap of past 73 hours:  80 year old female with history of complete heart block status post pacemaker placement, hypertension, hyperlipidemia, was stuck to diabetes mellitus, pulm embolism on chronic anticoagulation, chronic diastolic congestive heart failure she was admitted with complaints of fever shortness of breath and cough she was hypoxic on presentation x-ray showed possible infiltrate on the left lobe she was admitted for pneumonia and was started on IV antibiotics blood culture grows E. coli, due to recurrent bacteremia of unclear source ID was consulted.  Patient had E. coli bacteremia in July 2022 and was treated with Flagyl and ceftriaxone  Subjective: Patient seen and examined at bedside daughter in the room.  She was in the hallway with physical therapy. Patient denies any complaints.  She is waiting to go to TEE  Assessment/Plan: Principal Problem:   Acute respiratory failure with hypoxia --- due to CHF and left-sided pneumonia Active Problems:   Essential hypertension, benign   Complete heart block (HCC)/[employer] Jude pacemaker   Lt sided CAP (community acquired pneumonia)   Uncontrolled type 2 diabetes mellitus with hyperglycemia ([place])   Acute exacerbation of CHF (congestive heart failure) ([place])   Acute on chronic Diastolic heart failure with preserved ejection fraction (HFpEF) ([place])   #1 bacteremia. Recurrent E. coli bacteremia she had a similar bacteremia in July.  She was admitted with fever and tachycardia and blood culture again grew E. coli urine culture was without significant growth.  She is status post percutaneous chalasis to histamine 2 placement by IR on August 29 Infectious disease has seen and evaluated patient was waiting to get her TEE done for further management of her bacteremia We have decided that the E TEE did not show any vegetation and so  the source of E. coli bacteremia likely from her gallbladder.  They advised to transition her to p.o. antibiotics and patient can be discharged in the morning  2.  Acute on chronic diastolic congestive heart failure with exacerbation 2D echo showed right ventricular pressure volume overload Continue Lasix  History of pulmonary embolism on Eliquis  His hypertension currently stable continue current medication  Type 2 diabetes mellitus continue insulin regimen and monitor sugars  Complete heart block status post pacemaker placement 2D echo did not show any any vegetation so a TEE was scheduled to be done today  Code Status: Full  Severity of Illness: The appropriate patient status for this patient is INPATIENT. Inpatient status is judged to be reasonable and necessary in order to provide the required intensity of service to ensure the patient's safety. The patient's presenting symptoms, physical exam findings, and initial radiographic and laboratory data in the context of their chronic comorbidities is felt to place them at high risk for further clinical deterioration. Furthermore, it is not anticipated that the patient will be medically stable for discharge from the hospital within 2 midnights of admission. The following factors support the patient status of inpatient.   " Waiting for TEE Still on IV antibiotics Needs TEE to determine course of antibiotics  * I certify that at the point of admission it is my clinical judgment that the patient will require inpatient hospital care spanning beyond 2 midnights from the point of admission due to high intensity of service, high risk for further deterioration and high frequency of surveillance required.*   Family Communication: Daughter Mova at bedside  Disposition Plan: Home Status is: Inpatient  Dispo: The patient is from: Home              Anticipated d/c is to: Home              Anticipated d/c date is: Tomorrow              Patient  currently not medically stable for discharge  Consultants: Infectious disease Cardiology  Procedures: TEE  Antimicrobials: Duricef  DVT prophylaxis: Eliquis   Objective: Vitals:   08/27/21 1449 08/27/21 1455 08/27/21 1505 08/27/21 1942  BP: (!) 106/48 (!) 107/49 (!) 114/97 128/63  Pulse: 63 64 61 71  Resp: 19 19 (!) 22 18  Temp: (!) 97 F (36.1 C)   98.7 F (37.1 C)  TempSrc: Temporal   Oral  SpO2: 99% 100% 97% 98%  Weight:      Height:        Intake/Output Summary (Last 24 hours) at 08/27/2021 2111 Last data filed at 08/27/2021 1807 Gross per 24 hour  Intake 793.88 ml  Output 1300 ml  Net -506.12 ml   Filed Weights   08/25/21 0415 08/26/21 0309 08/27/21 0200  Weight: 92.7 kg 89.8 kg 89.1 kg   Body mass index is 32.7 kg/m.  Exam:  General: 80 y.o. year-old female well developed well nourished in no acute distress.  Alert and oriented x3. Cardiovascular: Regular rate and rhythm with no rubs or gallops.  No thyromegaly or JVD noted.   Respiratory: Clear to auscultation with no wheezes or rales. Good inspiratory effort. Abdomen: Soft nontender nondistended with normal bowel sounds x4 quadrants. Musculoskeletal: No lower extremity edema. 2/4 pulses in all 4 extremities. Skin: No ulcerative lesions noted or rashes, Psychiatry: Mood is appropriate for condition and setting Neurology:    Data Reviewed: CBC: Recent Labs  Lab 08/21/21 1336 08/22/21 0638 08/23/21 0242 08/25/21 0259 08/26/21 0333  WBC 8.8 7.5 7.9 6.1 6.3  NEUTROABS 7.1  --   --   --   --   HGB 11.3* 9.5* 9.2* 9.6* 10.0*  HCT 36.3 31.1* 29.3* 30.0* 31.1*  MCV 98.4 96.9 97.3 96.2 94.8  PLT 222 176 162 197 [id number]   Basic Metabolic Panel: Recent Labs  Lab 08/21/21 1336 08/22/21 0638 08/22/21 0638 08/23/21 0242 08/23/21 1556 08/24/21 0306 08/25/21 0259 08/26/21 0333 08/27/21 0352  NA 139 139  --  136  --   --  137 137  --   K 3.6 3.2*  --  3.5  --   --  4.4 4.0  --   CL 105 104  --   103  --   --  102 102  --   CO2 26 29  --  27  --   --  28 27  --   GLUCOSE 169* 154*  --  115*  --   --  121* 114*  --   BUN 9 9  --  11  --   --  9 10  --   CREATININE 0.73 0.81  --  0.69  --   --  0.61 0.79  --   CALCIUM 8.3* 8.2*  --  8.1*  --   --  9.0 8.9  --   MG  --  1.4*   < >  --  1.5* 2.3 1.9 1.6* 1.8  PHOS  --  4.2  --   --   --   --   --   --   --    < > =  values in this interval not displayed.   GFR: Estimated Creatinine Clearance: 62.8 mL/min (by C-G formula based on SCr of 0.79 mg/dL). Liver Function Tests: Recent Labs  Lab 08/21/21 1336 08/22/21 0638 08/23/21 0242  AST 16  --  19  ALT 13  --  13  ALKPHOS 48  --  40  BILITOT 0.8  --  0.7  PROT 7.6  --  5.9*  ALBUMIN 3.4* 2.6* 2.3*   No results for input(s): LIPASE, AMYLASE in the last 168 hours. No results for input(s): AMMONIA in the last 168 hours. Coagulation Profile: No results for input(s): INR, PROTIME in the last 168 hours. Cardiac Enzymes: No results for input(s): CKTOTAL, CKMB, CKMBINDEX, TROPONINI in the last 168 hours. BNP (last 3 results) No results for input(s): PROBNP in the last 8760 hours. HbA1C: No results for input(s): HGBA1C in the last 72 hours. CBG: Recent Labs  Lab 08/26/21 1551 08/26/21 2117 08/27/21 0549 08/27/21 1115 08/27/21 1705  GLUCAP 231* 199* 130* 115* 156*   Lipid Profile: No results for input(s): CHOL, HDL, LDLCALC, TRIG, CHOLHDL, LDLDIRECT in the last 72 hours. Thyroid Function Tests: No results for input(s): TSH, T4TOTAL, FREET4, T3FREE, THYROIDAB in the last 72 hours. Anemia Panel: No results for input(s): VITAMINB12, FOLATE, FERRITIN, TIBC, IRON, RETICCTPCT in the last 72 hours. Urine analysis:    Component Value Date/Time   COLORURINE STRAW (A) 08/21/2021 1553   APPEARANCEUR CLEAR 08/21/2021 1553   LABSPEC 1.008 08/21/2021 1553   PHURINE 7.0 08/21/2021 1553   GLUCOSEU NEGATIVE 08/21/2021 1553   HGBUR SMALL (A) 08/21/2021 1553   BILIRUBINUR NEGATIVE  08/21/2021 1553   KETONESUR 5 (A) 08/21/2021 1553   PROTEINUR NEGATIVE 08/21/2021 1553   UROBILINOGEN 0.2 06/18/2015 2300   NITRITE NEGATIVE 08/21/2021 1553   LEUKOCYTESUR SMALL (A) 08/21/2021 1553   Sepsis Labs: '@LABRCNTIP'$ (procalcitonin:4,lacticidven:4)  ) Recent Results (from the past 240 hour(s))  Culture, blood (routine x 2)     Status: Abnormal   Collection Time: 08/21/21  1:29 PM   Specimen: Right Antecubital; Blood  Result Value Ref Range Status   Specimen Description   Final    RIGHT ANTECUBITAL Performed at [facility], [address] [place], [place] [address]    Special Requests   Final    BOTTLES DRAWN AEROBIC AND ANAEROBIC Blood Culture adequate volume Performed at [facility], [address], [place], [place] [address]    Culture  Setup Time   Final    IN BOTH AEROBIC AND ANAEROBIC BOTTLES GRAM NEGATIVE RODS CRITICAL RESULT CALLED TO, READ BACK BY AND VERIFIED WITH: Renold Genta [id number] 08/22/2021 COLEMAN,R    Culture (A)  Final    ESCHERICHIA COLI SUSCEPTIBILITIES PERFORMED ON PREVIOUS CULTURE WITHIN THE LAST 5 DAYS. Performed at [facility], [place] [address], [place], [place] [address]    Report Status 08/24/2021 FINAL  Final  Resp Panel by RT-PCR (Flu A&B, Covid) Nasopharyngeal Swab     Status: None   Collection Time: 08/21/21  1:36 PM   Specimen: Nasopharyngeal Swab; Nasopharyngeal(NP) swabs in vial transport medium  Result Value Ref Range Status   SARS Coronavirus 2 by RT PCR NEGATIVE NEGATIVE Final    Comment: (NOTE) SARS-CoV-2 target nucleic acids are NOT DETECTED.  The SARS-CoV-2 RNA is generally detectable in upper respiratory specimens during the acute phase of infection. The lowest concentration of SARS-CoV-2 viral copies this assay can detect is 138 copies/mL. A negative result does not preclude SARS-Cov-2 infection and should not be used  as the sole basis for treatment or other patient management decisions. A negative result may occur with   improper specimen collection/handling, submission of specimen other than nasopharyngeal swab, presence of viral mutation(s) within the areas targeted by this assay, and inadequate number of viral copies(<138 copies/mL). A negative result must be combined with clinical observations, patient history, and epidemiological information. The expected result is Negative.  Fact Sheet for Patients:  EntrepreneurPulse.com.au  Fact Sheet for Healthcare Providers:  IncredibleEmployment.be  This test is no t yet approved or cleared by the [place] FDA and  has been authorized for detection and/or diagnosis of SARS-CoV-2 by FDA under an Emergency Use Authorization (EUA). This EUA will remain  in effect (meaning this test can be used) for the duration of the COVID-19 declaration under Section 564(b)(1) of the Act, 21 U.S.C.section 360bbb-3(b)(1), unless the authorization is terminated  or revoked sooner.       Influenza A by PCR NEGATIVE NEGATIVE Final   Influenza B by PCR NEGATIVE NEGATIVE Final    Comment: (NOTE) The Xpert Xpress SARS-CoV-2/FLU/RSV plus assay is intended as an aid in the diagnosis of influenza from Nasopharyngeal swab specimens and should not be used as a sole basis for treatment. Nasal washings and aspirates are unacceptable for Xpert Xpress SARS-CoV-2/FLU/RSV testing.  Fact Sheet for Patients: EntrepreneurPulse.com.au  Fact Sheet for Healthcare Providers: IncredibleEmployment.be  This test is not yet approved or cleared by the [place] FDA and has been authorized for detection and/or diagnosis of SARS-CoV-2 by FDA under an Emergency Use Authorization (EUA). This EUA will remain in effect (meaning this test can be used) for the duration of the COVID-19 declaration under Section 564(b)(1) of the Act, 21 U.S.C. section 360bbb-3(b)(1), unless the authorization is terminated  or revoked.  Performed at [facility], [address], [place], [place] [address]   Culture, blood (routine x 2)     Status: Abnormal   Collection Time: 08/21/21  1:37 PM   Specimen: Left Antecubital; Blood  Result Value Ref Range Status   Specimen Description   Final    LEFT ANTECUBITAL Performed at [facility], [address], [place], [place] [address]    Special Requests   Final    BOTTLES DRAWN AEROBIC AND ANAEROBIC Blood Culture adequate volume Performed at [facility], [address], [place], [place] [address]    Culture  Setup Time   Final    AEROBIC AND ANAEROBIC BOTTLE GRAM NEGATIVE RODS CRITICAL RESULT CALLED TO, READ BACK BY AND VERIFIED WITH: [place],E 0134 08/22/2021 COLEMAN,R GRAM STAIN REVIEWED-AGREE WITH RESULT CRITICAL RESULT CALLED TO, READ BACK BY AND VERIFIED WITH: PHARMD JAMES LEDFORD 08/22/21'@6'$ :01 BY TW Performed at [facility], [place] [address], [place], [place] [address]    Culture ESCHERICHIA COLI (A)  Final   Report Status 08/24/2021 FINAL  Final   Organism ID, Bacteria ESCHERICHIA COLI  Final      Susceptibility   Escherichia coli - MIC*    AMPICILLIN 8 SENSITIVE Sensitive     CEFAZOLIN <=4 SENSITIVE Sensitive     CEFEPIME <=0.12 SENSITIVE Sensitive     CEFTAZIDIME <=1 SENSITIVE Sensitive     CEFTRIAXONE <=0.25 SENSITIVE Sensitive     CIPROFLOXACIN <=0.25 SENSITIVE Sensitive     GENTAMICIN <=1 SENSITIVE Sensitive     IMIPENEM <=0.25 SENSITIVE Sensitive     TRIMETH/SULFA <=20 SENSITIVE Sensitive     AMPICILLIN/SULBACTAM 4 SENSITIVE Sensitive     PIP/TAZO <=4 SENSITIVE Sensitive     *  ESCHERICHIA COLI  Blood Culture ID Panel (Reflexed)     Status: Abnormal   Collection Time: 08/21/21  1:37 PM  Result Value Ref Range Status   Enterococcus faecalis NOT DETECTED NOT DETECTED Final   Enterococcus Faecium NOT DETECTED NOT DETECTED Final   Listeria monocytogenes NOT DETECTED NOT DETECTED Final   Staphylococcus species NOT DETECTED NOT DETECTED  Final   Staphylococcus aureus (BCID) NOT DETECTED NOT DETECTED Final   Staphylococcus epidermidis NOT DETECTED NOT DETECTED Final   Staphylococcus lugdunensis NOT DETECTED NOT DETECTED Final   Streptococcus species NOT DETECTED NOT DETECTED Final   Streptococcus agalactiae NOT DETECTED NOT DETECTED Final   Streptococcus pneumoniae NOT DETECTED NOT DETECTED Final   Streptococcus pyogenes NOT DETECTED NOT DETECTED Final   A.calcoaceticus-baumannii NOT DETECTED NOT DETECTED Final   Bacteroides fragilis NOT DETECTED NOT DETECTED Final   Enterobacterales DETECTED (A) NOT DETECTED Final    Comment: Enterobacterales represent a large order of gram negative bacteria, not a single organism. CRITICAL RESULT CALLED TO, READ BACK BY AND VERIFIED WITH: PHARMD JAMES LEDFORD 08/22/21'@6'$ :01 BY TW    Enterobacter cloacae complex NOT DETECTED NOT DETECTED Final   Escherichia coli DETECTED (A) NOT DETECTED Final    Comment: CRITICAL RESULT CALLED TO, READ BACK BY AND VERIFIED WITH: PHARMD JAMES LEDFORD 08/22/21'@6'$ :01 BY TW    Klebsiella aerogenes NOT DETECTED NOT DETECTED Final   Klebsiella oxytoca NOT DETECTED NOT DETECTED Final   Klebsiella pneumoniae NOT DETECTED NOT DETECTED Final   Proteus species NOT DETECTED NOT DETECTED Final   Salmonella species NOT DETECTED NOT DETECTED Final   Serratia marcescens NOT DETECTED NOT DETECTED Final   Haemophilus influenzae NOT DETECTED NOT DETECTED Final   Neisseria meningitidis NOT DETECTED NOT DETECTED Final   Pseudomonas aeruginosa NOT DETECTED NOT DETECTED Final   Stenotrophomonas maltophilia NOT DETECTED NOT DETECTED Final   Candida albicans NOT DETECTED NOT DETECTED Final   Candida auris NOT DETECTED NOT DETECTED Final   Candida glabrata NOT DETECTED NOT DETECTED Final   Candida krusei NOT DETECTED NOT DETECTED Final   Candida parapsilosis NOT DETECTED NOT DETECTED Final   Candida tropicalis NOT DETECTED NOT DETECTED Final   Cryptococcus neoformans/gattii  NOT DETECTED NOT DETECTED Final   CTX-M ESBL NOT DETECTED NOT DETECTED Final   Carbapenem resistance IMP NOT DETECTED NOT DETECTED Final   Carbapenem resistance KPC NOT DETECTED NOT DETECTED Final   Carbapenem resistance NDM NOT DETECTED NOT DETECTED Final   Carbapenem resist OXA 48 LIKE NOT DETECTED NOT DETECTED Final   Carbapenem resistance VIM NOT DETECTED NOT DETECTED Final    Comment: Performed at [facility], [address] [place], [place] [address]  Urine Culture     Status: Abnormal   Collection Time: 08/21/21  3:53 PM   Specimen: Urine, Clean Catch  Result Value Ref Range Status   Specimen Description   Final    URINE, CLEAN CATCH Performed at [facility], [address], [place], [place] [address]    Special Requests   Final    NONE Performed at [facility], [address] [place],  [address]    Culture (A)  Final    30,000 COLONIES/mL MULTIPLE SPECIES PRESENT, SUGGEST RECOLLECTION   Report Status 08/23/2021 FINAL  Final  Culture, blood (routine x 2)     Status: None (Preliminary result)   Collection Time: 08/23/21 10:22 AM   Specimen: BLOOD RIGHT HAND  Result Value Ref Range Status   Specimen Description BLOOD RIGHT  HAND  Final   Special Requests   Final    BOTTLES DRAWN AEROBIC ONLY Blood Culture results may not be optimal due to an inadequate volume of blood received in culture bottles   Culture   Final    NO GROWTH 4 DAYS Performed at [facility], [place] [address] [place], [place] [address]    Report Status PENDING  Incomplete  Culture, blood (routine x 2)     Status: None (Preliminary result)   Collection Time: 08/23/21 10:23 AM   Specimen: BLOOD  Result Value Ref Range Status   Specimen Description BLOOD LEFT ANTECUBITAL  Final   Special Requests   Final    BOTTLES DRAWN AEROBIC ONLY Blood Culture results may not be optimal due to an inadequate volume of blood received in culture bottles   Culture   Final    NO GROWTH 4 DAYS Performed  at [facility], [place] [address], [place], [place] [address]    Report Status PENDING  Incomplete      Studies: ECHO TEE  Result Date: 08/27/2021    TRANSESOPHOGEAL ECHO REPORT   Patient Name:   ALEXANDREA CLENNON Date of Exam: 08/27/2021 Medical Rec #:  [id number]    Height:       65.0 in Accession #:    [id number]   Weight:       196.5 lb Date of Birth:  09/16/1941    BSA:          1.963 m Patient Age:    30 years     BP:           152/69 mmHg Patient Gender: F            HR:           70 bpm. Exam Location:  Inpatient Procedure: Transesophageal Echo, Color Doppler and Cardiac Doppler Indications:     Bacteremia  History:         Patient has prior history of Echocardiogram examinations, most                  recent 08/24/2021. CHF, Pacemaker; Risk Factors:Hypertension,                  Diabetes and Dyslipidemia.  Sonographer:     Raquel Sarna Senior RDCS Referring Phys:  Industry Diagnosing Phys: Lyman Bishop MD PROCEDURE: After discussion of the risks and benefits of a TEE, an informed consent was obtained from the patient. The transesophogeal probe was passed without difficulty through the esophogus of the patient. Sedation performed by different physician. The patient was monitored while under deep sedation. Anesthestetic sedation was provided intravenously by Anesthesiology: '143mg'$  of Propofol. The patient developed no complications during the procedure. IMPRESSIONS  1. Left ventricular ejection fraction, by estimation, is 50 to 55%. The left ventricle has low normal function. 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  2. Right ventricular systolic function is low normal. The right ventricular size is normal.  3. Left atrial size was mildly dilated. No left atrial/left atrial appendage thrombus was detected.  4. Right atrial size was mildly dilated.  5. The mitral valve is abnormal. Mild mitral valve regurgitation.  6. The tricuspid valve is abnormal. Tricuspid valve regurgitation is mild to  moderate.  7. The aortic valve is tricuspid. Aortic valve regurgitation is not visualized. Conclusion(s)/Recommendation(s): No evidence of vegetation/infective endocarditis on this transesophageal echocardiogram. FINDINGS  Left Ventricle: Left ventricular ejection fraction, by estimation, is  50 to 55%. The left ventricle has low normal function. The left ventricular internal cavity size was normal in size. 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 Right Ventricle: The right ventricular size is normal. No increase in right ventricular wall thickness. Right ventricular systolic function is low normal. Left Atrium: Left atrial size was mildly dilated. No left atrial/left atrial appendage thrombus was detected. Right Atrium: Right atrial size was mildly dilated. Pericardium: There is no evidence of pericardial effusion. Mitral Valve: The mitral valve is abnormal. There is mild thickening of the mitral valve leaflet(s). Mild mitral valve regurgitation. Tricuspid Valve: The tricuspid valve is abnormal. Tricuspid valve regurgitation is mild to moderate. Aortic Valve: The aortic valve is tricuspid. Aortic valve regurgitation is not visualized. Pulmonic Valve: The pulmonic valve was grossly normal. Pulmonic valve regurgitation is not visualized. Aorta: The aortic root and ascending aorta are structurally normal, with no evidence of dilitation. IAS/Shunts: No atrial level shunt detected by color flow Doppler. Additional Comments: A device lead is visualized. Lyman Bishop MD Electronically signed by Lyman Bishop MD Signature Date/Time: 08/27/2021/3:31:54 PM    Final     Scheduled Meds:  apixaban  5 mg Oral BID   [START ON 08/28/2021] cefadroxil  500 mg Oral BID   furosemide  40 mg Oral Daily   guaiFENesin  600 mg Oral BID   hydrALAZINE  50 mg Oral BID   insulin aspart  0-5 Units Subcutaneous QHS   insulin aspart  0-6 Units Subcutaneous TID WC   insulin glargine-yfgn  20 Units Subcutaneous Q2200   losartan  50 mg  Oral Daily   metoprolol succinate  50 mg Oral Daily   potassium chloride  10 mEq Oral Daily   sodium chloride flush  3 mL Intravenous Q12H   sodium chloride flush  3 mL Intravenous Q12H   sodium chloride flush  5 mL Intracatheter Daily    Continuous Infusions:  sodium chloride Stopped (08/26/21 2109)     LOS: 6 days     Cristal Deer, MD [facility]  To reach me or the doctor on call, go to: [email or web address] Password [email or web address]  08/27/2021, 9:11 PM

## 2021-08-27 NOTE — Transfer of Care (Signed)
Immediate Anesthesia Transfer of Care Note  Patient: Natalie Castro  Procedure(s) Performed: TRANSESOPHAGEAL ECHOCARDIOGRAM (TEE)  Patient Location: Endoscopy Unit  Anesthesia Type:MAC  Level of Consciousness: drowsy and patient cooperative  Airway & Oxygen Therapy: Patient Spontanous Breathing and Patient connected to nasal cannula oxygen  Post-op Assessment: Report given to RN and Post -op Vital signs reviewed and stable  Post vital signs: Reviewed and stable  Last Vitals:  Vitals Value Taken Time  BP 106/48 08/27/21 1449  Temp 36.1 C 08/27/21 1449  Pulse 65 08/27/21 1449  Resp 25 08/27/21 1449  SpO2 100 % 08/27/21 1449  Vitals shown include unvalidated device data.  Last Pain:  Vitals:   08/27/21 1449  TempSrc: Temporal  PainSc: 0-No pain         Complications: No notable events documented.

## 2021-08-27 NOTE — Progress Notes (Signed)
  Mobility Specialist Criteria Algorithm Info.  Mobility Team: HOB elevated: Activity: Ambulated in hall; Dangled on edge of bed Range of motion: Active; All extremities Level of assistance: Standby assist, set-up cues, supervision of patient - no hands on Assistive device: Four wheel walker Minutes sitting in chair:  Minutes stood: 5 minutes Minutes ambulated: 5 minutes Distance ambulated (ft): 360 ft Mobility response: Tolerated well (Required seated rest x4) Bed Position: Semi-fowlers  Patient received lying supine in bed agreeable to participate in mobility. Got to EOB supine>sit with supervision and stood with minimal assist. Ambulated in hallway w/rolator 360 feet at supervision with steady gait. Required seated rest break x4 secondary to fatigue. HR sustained mid 70's throughout. Tolerated ambulation well without complaint or incident and was left dangling EOB with all needs met.   08/27/2021 12:44 PM

## 2021-08-27 NOTE — Progress Notes (Addendum)
I have seen and examined the patient. I have personally reviewed the clinical findings, laboratory findings, microbiological data and imaging studies. The assessment and treatment plan was discussed with the  Advance Practice Provider, Mauricio Po  I agree with her/his recommendations except following additions/corrections.  Continues to remain afebrile, no leukocytosis.  TEE with no evidence of endocarditis/vegetations Likely source of E coli bacteremia is GB/biliary   Plan  Can transition IV cefazolin to PO cefdinir for 7 more days. End date 09/03/21 Drain management per VIR Follow up with Surgery regarding timing of cholecystectomy  ID will sign off for now. Please call with questions   Rosiland Oz, MD Infectious Disease Physician [facility] [address] 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 Appears source control has been achieved with change of tube exchange via IR on 08/23/21 with blood cultures remaining without growth. If TEE is negative will plan to switch to oral abtx. If TEE is positive will need at least 6 weeks of treatment. Continue current dose of Cefazolin pending TEE results. Drain management per IR. Remaining supportive care as needed per primary team.   PLAN:  Continue Cefazolin. Final recommendations pending TEE results. Drain management per IR.  Remaining supportive care as needed per primary team.   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 Blood cultures from 08/23/21 remain without growth. Scheduled for TEE today. Drain output of 375 over the last 24 hours.  Feeling okay today. Ready to go home and has questions about when the drain will be removed.   Allergies  Allergen Reactions   Penicillins Hives, Itching and Other (See Comments)    Has tolerated ceftriaxone 7/22 Has patient had a PCN reaction causing immediate rash, facial/tongue/throat swelling, SOB or lightheadedness with hypotension: No Has patient had a PCN reaction causing severe rash involving mucus membranes or skin necrosis: Yes Has patient had a PCN reaction that required  hospitalization No Has patient had a PCN reaction occurring within the last 10 years: No If all of the above answers are "NO", then may proceed with Cephalosporin use.      Review of Systems: Review of Systems  Constitutional:  Negative for chills, fever and weight loss.  Respiratory:  Negative for cough, shortness of breath and wheezing.    Cardiovascular:  Negative for chest pain and leg swelling.  Gastrointestinal:  Negative for abdominal pain, constipation, diarrhea, nausea and vomiting.  Skin:  Negative for rash.     OBJECTIVE: Vitals:   08/26/21 1134 08/26/21 1949 08/27/21 0200 08/27/21 0358  BP: 119/77 (!) 141/57  (!) 144/60  Pulse: 68 68  69  Resp: 20 (!) 21  17  Temp: 98.1 F (36.7 C) 98.9 F (37.2 C)  98.3 F (36.8 C)  TempSrc: Oral Oral  Oral  SpO2: 97% 92%  93%  Weight:   89.1 kg   Height:       Body mass index is 32.7 kg/m.  Physical Exam Constitutional:      General: She is not in acute distress.    Appearance: She is well-developed.     Comments: Lying in bed with head of bed elevated; pleasant.   Cardiovascular:     Rate and Rhythm: Normal rate and regular rhythm.     Heart sounds: Normal heart sounds.  Pulmonary:     Effort: Pulmonary effort is normal.     Breath sounds: Normal breath sounds.  Abdominal:     Palpations: Abdomen is soft.     Tenderness: There is no abdominal tenderness.     Comments: Drain present with biliary drainage.   Skin:    General: Skin is warm and dry.  Neurological:     Mental Status: She is alert.  Psychiatric:        Mood and Affect: Mood normal.        Behavior: Behavior normal.        Thought Content: Thought content normal.        Judgment: Judgment normal.    Lab Results Lab Results  Component Value Date   WBC 6.3 08/26/2021   HGB 10.0 (L) 08/26/2021   HCT 31.1 (L) 08/26/2021   MCV 94.8 08/26/2021   PLT 211 08/26/2021    Lab Results  Component Value Date   CREATININE 0.79 08/26/2021   BUN 10 08/26/2021   NA 137 08/26/2021   K 4.0 08/26/2021   CL 102 08/26/2021   CO2 27 08/26/2021    Lab Results  Component Value Date   ALT 13 08/23/2021   AST 19 08/23/2021   ALKPHOS 40 08/23/2021   BILITOT 0.7 08/23/2021     Microbiology: Recent Results (from the past 240 hour(s))  Culture, blood (routine x 2)     Status: Abnormal   Collection  Time: 08/21/21  1:29 PM   Specimen: Right Antecubital; Blood  Result Value Ref Range Status   Specimen Description   Final    RIGHT ANTECUBITAL Performed at [facility], [address], [place], [place] [address]    Special Requests   Final    BOTTLES DRAWN AEROBIC AND ANAEROBIC Blood Culture adequate volume Performed at [facility], [address] [place], [place] [address]    Culture  Setup Time   Final    IN BOTH AEROBIC AND ANAEROBIC BOTTLES GRAM NEGATIVE RODS CRITICAL RESULT CALLED TO, READ BACK BY AND VERIFIED WITH: Renold Genta [id number] 08/22/2021 COLEMAN,R  Culture (A)  Final    ESCHERICHIA COLI SUSCEPTIBILITIES PERFORMED ON PREVIOUS CULTURE WITHIN THE LAST 5 DAYS. Performed at [facility], [place] [address] [place], [place] [address]    Report Status 08/24/2021 FINAL  Final  Resp Panel by RT-PCR (Flu A&B, Covid) Nasopharyngeal Swab     Status: None   Collection Time: 08/21/21  1:36 PM   Specimen: Nasopharyngeal Swab; Nasopharyngeal(NP) swabs in vial transport medium  Result Value Ref Range Status   SARS Coronavirus 2 by RT PCR NEGATIVE NEGATIVE Final    Comment: (NOTE) SARS-CoV-2 target nucleic acids are NOT DETECTED.  The SARS-CoV-2 RNA is generally detectable in upper respiratory specimens during the acute phase of infection. The lowest concentration of SARS-CoV-2 viral copies this assay can detect is 138 copies/mL. A negative result does not preclude SARS-Cov-2 infection and should not be used as the sole basis for treatment or other patient management decisions. A negative result may occur with  improper specimen collection/handling, submission of specimen other than nasopharyngeal swab, presence of viral mutation(s) within the areas targeted by this assay, and inadequate number of viral copies(<138 copies/mL). A negative result must be combined with clinical observations, patient history, and epidemiological information. The expected result is Negative.  Fact  Sheet for Patients:  EntrepreneurPulse.com.au  Fact Sheet for Healthcare Providers:  IncredibleEmployment.be  This test is no t yet approved or cleared by the [place] FDA and  has been authorized for detection and/or diagnosis of SARS-CoV-2 by FDA under an Emergency Use Authorization (EUA). This EUA will remain  in effect (meaning this test can be used) for the duration of the COVID-19 declaration under Section 564(b)(1) of the Act, 21 U.S.C.section 360bbb-3(b)(1), unless the authorization is terminated  or revoked sooner.       Influenza A by PCR NEGATIVE NEGATIVE Final   Influenza B by PCR NEGATIVE NEGATIVE Final    Comment: (NOTE) The Xpert Xpress SARS-CoV-2/FLU/RSV plus assay is intended as an aid in the diagnosis of influenza from Nasopharyngeal swab specimens and should not be used as a sole basis for treatment. Nasal washings and aspirates are unacceptable for Xpert Xpress SARS-CoV-2/FLU/RSV testing.  Fact Sheet for Patients: EntrepreneurPulse.com.au  Fact Sheet for Healthcare Providers: IncredibleEmployment.be  This test is not yet approved or cleared by the [place] FDA and has been authorized for detection and/or diagnosis of SARS-CoV-2 by FDA under an Emergency Use Authorization (EUA). This EUA will remain in effect (meaning this test can be used) for the duration of the COVID-19 declaration under Section 564(b)(1) of the Act, 21 U.S.C. section 360bbb-3(b)(1), unless the authorization is terminated or revoked.  Performed at [facility], [address] [place], [place] [address]   Culture, blood (routine x 2)     Status: Abnormal   Collection Time: 08/21/21  1:37 PM   Specimen: Left Antecubital; Blood  Result Value Ref Range Status   Specimen Description   Final    LEFT ANTECUBITAL Performed at [facility], [address], [place], [place] [address]    Special Requests   Final     BOTTLES DRAWN AEROBIC AND ANAEROBIC Blood Culture adequate volume Performed at [facility], [address] [place], [place] [address]    Culture  Setup Time   Final    AEROBIC AND ANAEROBIC BOTTLE GRAM NEGATIVE RODS CRITICAL RESULT CALLED TO, READ BACK BY AND VERIFIED WITH: [place],E 0134 08/22/2021 COLEMAN,R GRAM STAIN REVIEWED-AGREE WITH RESULT CRITICAL RESULT CALLED TO, READ BACK BY AND VERIFIED  WITH: PHARMD JAMES LEDFORD 08/22/21'@6'$ :01 BY TW Performed at [facility], [place] [address] [place], [place] [address]    Culture ESCHERICHIA COLI (A)  Final   Report Status 08/24/2021 FINAL  Final   Organism ID, Bacteria ESCHERICHIA COLI  Final      Susceptibility   Escherichia coli - MIC*    AMPICILLIN 8 SENSITIVE Sensitive     CEFAZOLIN <=4 SENSITIVE Sensitive     CEFEPIME <=0.12 SENSITIVE Sensitive     CEFTAZIDIME <=1 SENSITIVE Sensitive     CEFTRIAXONE <=0.25 SENSITIVE Sensitive     CIPROFLOXACIN <=0.25 SENSITIVE Sensitive     GENTAMICIN <=1 SENSITIVE Sensitive     IMIPENEM <=0.25 SENSITIVE Sensitive     TRIMETH/SULFA <=20 SENSITIVE Sensitive     AMPICILLIN/SULBACTAM 4 SENSITIVE Sensitive     PIP/TAZO <=4 SENSITIVE Sensitive     * ESCHERICHIA COLI  Blood Culture ID Panel (Reflexed)     Status: Abnormal   Collection Time: 08/21/21  1:37 PM  Result Value Ref Range Status   Enterococcus faecalis NOT DETECTED NOT DETECTED Final   Enterococcus Faecium NOT DETECTED NOT DETECTED Final   Listeria monocytogenes NOT DETECTED NOT DETECTED Final   Staphylococcus species NOT DETECTED NOT DETECTED Final   Staphylococcus aureus (BCID) NOT DETECTED NOT DETECTED Final   Staphylococcus epidermidis NOT DETECTED NOT DETECTED Final   Staphylococcus lugdunensis NOT DETECTED NOT DETECTED Final   Streptococcus species NOT DETECTED NOT DETECTED Final   Streptococcus agalactiae NOT DETECTED NOT DETECTED Final   Streptococcus pneumoniae NOT DETECTED NOT DETECTED Final   Streptococcus pyogenes  NOT DETECTED NOT DETECTED Final   A.calcoaceticus-baumannii NOT DETECTED NOT DETECTED Final   Bacteroides fragilis NOT DETECTED NOT DETECTED Final   Enterobacterales DETECTED (A) NOT DETECTED Final    Comment: Enterobacterales represent a large order of gram negative bacteria, not a single organism. CRITICAL RESULT CALLED TO, READ BACK BY AND VERIFIED WITH: PHARMD JAMES LEDFORD 08/22/21'@6'$ :01 BY TW    Enterobacter cloacae complex NOT DETECTED NOT DETECTED Final   Escherichia coli DETECTED (A) NOT DETECTED Final    Comment: CRITICAL RESULT CALLED TO, READ BACK BY AND VERIFIED WITH: PHARMD JAMES LEDFORD 08/22/21'@6'$ :01 BY TW    Klebsiella aerogenes NOT DETECTED NOT DETECTED Final   Klebsiella oxytoca NOT DETECTED NOT DETECTED Final   Klebsiella pneumoniae NOT DETECTED NOT DETECTED Final   Proteus species NOT DETECTED NOT DETECTED Final   Salmonella species NOT DETECTED NOT DETECTED Final   Serratia marcescens NOT DETECTED NOT DETECTED Final   Haemophilus influenzae NOT DETECTED NOT DETECTED Final   Neisseria meningitidis NOT DETECTED NOT DETECTED Final   Pseudomonas aeruginosa NOT DETECTED NOT DETECTED Final   Stenotrophomonas maltophilia NOT DETECTED NOT DETECTED Final   Candida albicans NOT DETECTED NOT DETECTED Final   Candida auris NOT DETECTED NOT DETECTED Final   Candida glabrata NOT DETECTED NOT DETECTED Final   Candida krusei NOT DETECTED NOT DETECTED Final   Candida parapsilosis NOT DETECTED NOT DETECTED Final   Candida tropicalis NOT DETECTED NOT DETECTED Final   Cryptococcus neoformans/gattii NOT DETECTED NOT DETECTED Final   CTX-M ESBL NOT DETECTED NOT DETECTED Final   Carbapenem resistance IMP NOT DETECTED NOT DETECTED Final   Carbapenem resistance KPC NOT DETECTED NOT DETECTED Final   Carbapenem resistance NDM NOT DETECTED NOT DETECTED Final   Carbapenem resist OXA 48 LIKE NOT DETECTED NOT DETECTED Final   Carbapenem resistance VIM NOT DETECTED NOT DETECTED Final     Comment: Performed at [facility], [place] [address], [place], [place] [address]  Urine Culture     Status: Abnormal   Collection Time: 08/21/21  3:53 PM   Specimen: Urine, Clean Catch  Result Value Ref Range Status   Specimen Description   Final    URINE, CLEAN CATCH Performed at [facility], [address] [place], [place] [address]    Special Requests   Final    NONE Performed at [facility], [address] [place], [place] [address]    Culture (A)  Final    30,000 COLONIES/mL MULTIPLE SPECIES PRESENT, SUGGEST RECOLLECTION   Report Status 08/23/2021 FINAL  Final  Culture, blood (routine x 2)     Status: None (Preliminary result)   Collection Time: 08/23/21 10:22 AM   Specimen: BLOOD RIGHT HAND  Result Value Ref Range Status   Specimen Description BLOOD RIGHT HAND  Final   Special Requests   Final    BOTTLES DRAWN AEROBIC ONLY Blood Culture results may not be optimal due to an inadequate volume of blood received in culture bottles   Culture   Final    NO GROWTH 4 DAYS Performed at [facility], [place] [address], [place], [place] [address]    Report Status PENDING  Incomplete  Culture, blood (routine x 2)     Status: None (Preliminary result)   Collection Time: 08/23/21 10:23 AM   Specimen: BLOOD  Result Value Ref Range Status   Specimen Description BLOOD LEFT ANTECUBITAL  Final   Special Requests   Final    BOTTLES DRAWN AEROBIC ONLY Blood Culture results may not be optimal due to an inadequate volume of blood received in culture bottles   Culture   Final    NO GROWTH 4 DAYS Performed at [facility], [place] [address], [place],  [address]    Report Status PENDING  Incomplete    Pertinent Imaging TEE 08/27/21 1. Left ventricular ejection fraction, by estimation, is 50 to 55%. The  left ventricle has low normal function. There is moderate left ventricular  hypertrophy.   2. Right ventricular systolic function is low normal. The right  ventricular  size is normal.   3. Left atrial size was mildly dilated. No left atrial/left atrial  appendage thrombus was detected.   4. Right atrial size was mildly dilated.   5. The mitral valve is abnormal. Mild mitral valve regurgitation.   6. The tricuspid valve is abnormal. Tricuspid valve regurgitation is mild  to moderate.   7. The aortic valve is tricuspid. Aortic valve regurgitation is not  visualized.   Conclusion(s)/Recommendation(s): No evidence of vegetation/infective  endocarditis on this transesophageal  echocardiogram.    Terri Piedra, [place] for Infectious [facility]  08/27/2021  8:41 AM

## 2021-08-27 NOTE — Anesthesia Postprocedure Evaluation (Signed)
Anesthesia Post Note  Patient: Natalie Castro  Procedure(s) Performed: TRANSESOPHAGEAL ECHOCARDIOGRAM (TEE)     Patient location during evaluation: PACU Level of consciousness: awake and alert Pain management: pain level controlled Vital Signs Assessment: post-procedure vital signs reviewed and stable Respiratory status: spontaneous breathing, nonlabored ventilation and respiratory function stable Cardiovascular status: stable and blood pressure returned to baseline Postop Assessment: no apparent nausea or vomiting Anesthetic complications: no   No notable events documented.  Last Vitals:  Vitals:   08/27/21 1449 08/27/21 1455  BP: (!) 106/48 (!) 107/49  Pulse: 63 64  Resp: 19 19  Temp: (!) 36.1 C   SpO2: 99% 100%    Last Pain:  Vitals:   08/27/21 1455  TempSrc:   PainSc: 0-No pain                 Samer Dutton A.

## 2021-08-27 NOTE — CV Procedure (Signed)
TRANSESOPHAGEAL ECHOCARDIOGRAM (TEE) NOTE  INDICATIONS: infective endocarditis  PROCEDURE:   Informed consent was obtained prior to the procedure. The risks, benefits and alternatives for the procedure were discussed and the patient comprehended these risks.  Risks include, but are not limited to, cough, sore throat, vomiting, nausea, somnolence, esophageal and stomach trauma or perforation, bleeding, low blood pressure, aspiration, pneumonia, infection, trauma to the teeth and death.    After a procedural time-out, the patient was given propofol per anesthesia for sedation.  The patient's heart rate, blood pressure, and oxygen saturation are monitored continuously during the procedure.The oropharynx was anesthetized with topical 1% viscous cetacaine.  The transesophageal probe was inserted in the esophagus and stomach without difficulty and multiple views were obtained.  The patient was kept under observation until the patient left the procedure room. I was present face-to-face 100% of this time. The patient left the procedure room in stable condition.   Agitated microbubble saline contrast was not administered.  COMPLICATIONS:    There were no immediate complications.  Findings:  LEFT VENTRICLE: The left ventricular wall thickness is moderately increased.  The left ventricular cavity is normal in size. Wall motion is normal.  LVEF is 50-55%.  RIGHT VENTRICLE:  The right ventricle is normal in structure and function without any thrombus or masses.  Pacing wires are noted - no thrombus or vegetations.  LEFT ATRIUM:  The left atrium is mildly dilated in size without any thrombus or masses.  There is not spontaneous echo contrast ("smoke") in the left atrium consistent with a low flow state.  LEFT ATRIAL APPENDAGE:  The left atrial appendage is free of any thrombus or masses. The appendage has single lobes. Pulse doppler indicates moderate flow in the appendage.  ATRIAL SEPTUM:  The  atrial septum appears intact and is free of thrombus and/or masses.  There is no evidence for interatrial shunting by color doppler and saline microbubble.  RIGHT ATRIUM:  The right atrium is mildly dilated in size and function without any thrombus or masses. Pacing wires are noted - no thrombus or vegetations.  MITRAL VALVE:  The mitral valve is normal in structure and function with Mild regurgitation.  There were no vegetations or stenosis.  AORTIC VALVE:  The aortic valve is trileaflet, normal in structure and function with  no  regurgitation.  There were no vegetations or stenosis  TRICUSPID VALVE:  The tricuspid valve is normal in structure and function with  mild to moderate  regurgitation.  There were no vegetations or stenosis   PULMONIC VALVE:  The pulmonic valve is normal in structure and function with  no  regurgitation.  There were no vegetations or stenosis.   AORTIC ARCH, ASCENDING AND DESCENDING AORTA:  There was grade 2 Ron Parker et. Al, 1992) atherosclerosis of the ascending aorta, aortic arch, or proximal descending aorta.  12. PULMONARY VEINS: Anomalous pulmonary venous return was not noted.  13. PERICARDIUM: The pericardium appeared normal and non-thickened.  There is no pericardial effusion.  IMPRESSION:   No endocarditis Negative for PFO No LAA thrombus Right heart pacing wires Mild to moderate TR Mild MR Grade 2 aortic atherosclerosis LVEF 50-55%, moderate LVH  RECOMMENDATIONS:    Antibiotics for bacteremia - no evidence of endocarditis of the valves or pacing system.  Time Spent Directly with the Patient:  45 minutes   Pixie Casino, MD, [facility], [place] Director of the Advanced Lipid Disorders &  Cardiovascular Risk  Reduction Clinic Diplomate of the American Board of Clinical Lipidology Attending Cardiologist  Direct Dial: [phone]  Fax: [phone]  Website:  [email or web address] 08/27/2021,  2:45 PM

## 2021-08-27 NOTE — Anesthesia Preprocedure Evaluation (Addendum)
Anesthesia Evaluation  Patient identified by MRN, date of birth, ID band Patient awake    Reviewed: Allergy & Precautions, NPO status , Patient's Chart, lab work & pertinent test results, reviewed documented beta blocker date and time   Airway Mallampati: II  TM Distance: >3 FB Neck ROM: Full    Dental  (+) Dental Advisory Given, Edentulous Upper, Missing,    Pulmonary pneumonia, unresolved, PE Hx/ recent ARF Hx/o Pulmonary saddle embolism   Pulmonary exam normal breath sounds clear to auscultation       Cardiovascular hypertension, Pt. on medications +CHF  Normal cardiovascular exam+ dysrhythmias + pacemaker  Rhythm:Regular Rate:Normal     Neuro/Psych negative neurological ROS  negative psych ROS   GI/Hepatic Neg liver ROS, GERD  Medicated and Controlled,Cholelithiasis   Endo/Other  diabetes, Poorly Controlled, Type 2  Renal/GU Renal disease  negative genitourinary   Musculoskeletal  (+) Arthritis , Osteoarthritis,    Abdominal (+) + obese,   Peds  Hematology Eliquis therapy- last dose 08/20/2021 Recurrent E. Coli bacteremia   Anesthesia Other Findings   Reproductive/Obstetrics                            Anesthesia Physical Anesthesia Plan  ASA: 3  Anesthesia Plan: MAC   Post-op Pain Management:    Induction: Intravenous  PONV Risk Score and Plan: 4 or greater and Treatment may vary due to age or medical condition  Airway Management Planned: Simple Face Mask and Natural Airway  Additional Equipment:   Intra-op Plan:   Post-operative Plan:   Informed Consent: I have reviewed the patients History and Physical, chart, labs and discussed the procedure including the risks, benefits and alternatives for the proposed anesthesia with the patient or authorized representative who has indicated his/her understanding and acceptance.     Dental advisory given  Plan Discussed with:  CRNA and Anesthesiologist  Anesthesia Plan Comments:        Anesthesia Quick Evaluation

## 2021-08-27 NOTE — Progress Notes (Signed)
   08/26/21 1400  Mobility  Activity Refused mobility (Declined for unspecified reasons)

## 2021-08-27 NOTE — Anesthesia Procedure Notes (Signed)
Procedure Name: MAC Date/Time: 08/27/2021 2:29 PM Performed by: Kathryne Hitch, CRNA Pre-anesthesia Checklist: Patient identified, Emergency Drugs available, Suction available and Patient being monitored Patient Re-evaluated:Patient Re-evaluated prior to induction Oxygen Delivery Method: Nasal cannula Preoxygenation: Pre-oxygenation with 100% oxygen Induction Type: IV induction Placement Confirmation: positive ETCO2 Dental Injury: Teeth and Oropharynx as per pre-operative assessment

## 2021-08-28 DIAGNOSIS — J9601 Acute respiratory failure with hypoxia: Secondary | ICD-10-CM | POA: Diagnosis not present

## 2021-08-28 LAB — GLUCOSE, CAPILLARY
Glucose-Capillary: 111 mg/dL — ABNORMAL HIGH (ref 70–99)
Glucose-Capillary: 211 mg/dL — ABNORMAL HIGH (ref 70–99)

## 2021-08-28 LAB — CULTURE, BLOOD (ROUTINE X 2)
Culture: NO GROWTH
Culture: NO GROWTH

## 2021-08-28 MED ORDER — GUAIFENESIN ER 600 MG PO TB12: 600.0000 mg | ORAL_TABLET | Freq: Two times a day (BID) | ORAL | 0 refills | Status: DC | PRN

## 2021-08-28 MED ORDER — CEFADROXIL 500 MG PO CAPS: 500.0000 mg | ORAL_CAPSULE | Freq: Two times a day (BID) | ORAL | 0 refills | Status: DC

## 2021-08-28 MED ORDER — POLYETHYLENE GLYCOL 3350 17 G PO PACK: 17.0000 g | PACK | Freq: Every day | ORAL | 0 refills | Status: DC | PRN

## 2021-08-28 NOTE — Progress Notes (Signed)
Patient ID: Natalie Castro, female   DOB: [birth year], 80 y.o.   MRN: [id number] Per order of Dr. [name], pt's GB drain was capped today; site appears ok, pt is NT in region; pt given extra drain bag and pt/family/nurse told to monitor for increasing abd pain or leakage at site; if this occurs recommend reattaching bag to drain.

## 2021-08-28 NOTE — Progress Notes (Signed)
Patient ID: Natalie Castro, female   DOB: [birth year], 80 y.o.   MRN: [id number] Doing ok, no ab pain or tenderness With nl tube gram I think fine to cap to see how she does while here

## 2021-08-28 NOTE — Discharge Summary (Addendum)
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 coli, due to recurrent bacteremia of unclear source ID was consulted.  Patient had E. 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  The blood culture was showing E. coli.  Patient has had bacteremia in the past.  Due to the recurrence of the bacteremia with unclear source ID was consulted.  Patient had also undergone gallbladder procedure with drain placement.  She also underwent TEE to find out where the cause of her bacteremia was but TEE was negative for vegetation.  It was felt that the source of her bacteremia is from the gallbladder.  She was discharged in improved condition to complete her antibiotics by mouth and follow-up with surgery and interventional radiology.  Discussed with her 2 daughters were at bedside  Procedures: TEE  Consultations: Cardiology Infectious disease General surgery  Discharge Exam: BP 131/66 (BP Location: Right Arm)   Pulse 69   Temp 99.1 F (37.3 C) (Oral)   Resp 20   Ht '5\' 5"'$  (1.651 m)   Wt 89.1 kg   SpO2 97%   BMI 32.68 kg/m   General: Alert oriented x3 no distress Cardiovascular: Regular rate and rhythm Respiratory: Clear to auscultation  Discharge Instructions You were cared for by a hospitalist during your hospital stay. If you have any  questions about your discharge medications or the care you received while you were in the hospital after you are discharged, you can call the unit and asked to speak with the hospitalist on call if the hospitalist that took care of you  is not available. Once you are discharged, your primary care physician will handle any further medical issues. Please note that NO REFILLS for any discharge medications will be authorized once you are discharged, as it is imperative that you return to your primary care physician (or establish a relationship with a primary care physician if you do not have one) for your aftercare needs so that they can reassess your need for medications and monitor your lab values.  Discharge Instructions     Call MD for:  persistant nausea and vomiting   Complete by: As directed    Call MD for:  severe uncontrolled pain   Complete by: As directed    Call MD for:  temperature >100.4   Complete by: As directed    Diet - low sodium heart healthy   Complete by: As directed    Discharge instructions   Complete by: As directed    Follow-up with IR as scheduled Follow-up surgery as scheduled Follow-up PCP in 2 weeks   Discharge wound care:   Complete by: As directed    Per surgery for drain   Increase activity slowly   Complete by: As directed       Allergies as of 08/28/2021       Reactions   Penicillins Hives, Itching, Other (See Comments)   Has tolerated ceftriaxone 7/22 Has patient had a PCN reaction causing immediate rash, facial/tongue/throat swelling, SOB or lightheadedness with hypotension: No Has patient had a PCN reaction causing severe rash involving mucus membranes or skin necrosis: Yes Has patient had a PCN reaction that required hospitalization No Has patient had a PCN reaction occurring within the last 10 years: No If all of the above answers are "NO", then may proceed with Cephalosporin use.        Medication List     TAKE these medications    atorvastatin 40 MG tablet Commonly known as: LIPITOR TAKE (1) TABLET BY MOUTH AT BEDTIME. What changed: See the new instructions.   cefadroxil 500 MG capsule Commonly known as: DURICEF Take 1 capsule (500 mg total) by mouth 2 (two)  times daily.   Eliquis 5 MG Tabs tablet Generic drug: apixaban TAKE 1 TABLET BY MOUTH TWICE A DAY.   furosemide 40 MG tablet Commonly known as: LASIX Take 1 tablet (40 mg total) by mouth daily.   glucose blood test strip Use to check blood glucose fasting , before lunch and dinner and after your largest meal   guaiFENesin 600 MG 12 hr tablet Commonly known as: MUCINEX Take 1 tablet (600 mg total) by mouth 2 (two) times daily as needed for cough.   hydrALAZINE 50 MG tablet Commonly known as: APRESOLINE Take 1 tablet (50 mg total) by mouth 2 (two) times daily.   Lantus SoloStar 100 UNIT/ML Solostar Pen Generic drug: insulin glargine ADMINISTER 30 UNITS UNDER THE SKIN AT BEDTIME   losartan 100 MG tablet Commonly known as: COZAAR TAKE (1) TABLET BY MOUTH ONCE DAILY. What changed: See the new instructions.   metFORMIN 1000 MG tablet Commonly known as: GLUCOPHAGE TAKE (1) TABLET BY MOUTH TWICE DAILY.   metoprolol succinate 50 MG 24 hr tablet Commonly known as: TOPROL-XL Take 50 mg by mouth daily.   nitroGLYCERIN  0.4 MG SL tablet Commonly known as: NITROSTAT Place 1 tablet (0.4 mg total) under the tongue every 5 (five) minutes as needed for chest pain.   onetouch ultrasoft lancets Use to check glucose 4 x daily   polyethylene glycol 17 g packet Commonly known as: MIRALAX / GLYCOLAX Take 17 g by mouth daily as needed for mild constipation.   potassium chloride 10 MEQ tablet Commonly known as: KLOR-CON Take 1 tablet (10 mEq total) by mouth daily.               Discharge Care Instructions  (From admission, onward)           Start     Ordered   08/28/21 0000  Discharge wound care:       Comments: Per surgery for drain   08/28/21 1230           Allergies  Allergen Reactions   Penicillins Hives, Itching and Other (See Comments)    Has tolerated ceftriaxone 7/22 Has patient had a PCN reaction causing immediate rash, facial/tongue/throat swelling,  SOB or lightheadedness with hypotension: No Has patient had a PCN reaction causing severe rash involving mucus membranes or skin necrosis: Yes Has patient had a PCN reaction that required hospitalization No Has patient had a PCN reaction occurring within the last 10 years: No If all of the above answers are "NO", then may proceed with Cephalosporin use.     Follow-up Information     [name], MD Follow up.   Specialty: Family Medicine Contact information: [place] [address] [phone]         Evans Lance, MD .   Specialty: Cardiology Contact information: [phone] [address] [place] [address] [phone]         Evans Lance, MD .   Specialty: Cardiology Contact information: [phone] [address] [place] [address] [phone]         Rolm Bookbinder, MD Follow up in 3 week(s).   Specialty: General Surgery Contact information: [place] [address] [phone]                  The results of significant diagnostics from this hospitalization (including imaging, microbiology, ancillary and laboratory) are listed below for reference.    Significant Diagnostic Studies: DG Chest 2 View  Result Date: 08/22/2021 CLINICAL DATA:  Dyspnea and respiratory failure. EXAM: CHEST - 2 VIEW COMPARISON:  08/21/2021 FINDINGS: Left chest wall pacer device is noted with leads over the right atrial appendage and right ventricle. Stable cardiomediastinal contours. Interval worsening of aeration to the left lower lung which likely reflects a combination of new pleural effusion as well as atelectasis or consolidation in the left base. Stable appearance of the right lung. IMPRESSION: Worsening aeration to the left lower lung which likely reflects a combination of new pleural effusion as well as atelectasis and consolidation in the left lower lobe. Electronically Signed   By: Kerby Moors M.D.   On: 08/22/2021 09:59   CT ABDOMEN PELVIS W CONTRAST  Result Date: 08/22/2021 CLINICAL DATA:  S/p percutaneous cholecystostomy tube which remains in place - previously completed 2 weeks of abx tx for bacteremia, eval infection EXAM: CT ABDOMEN AND PELVIS WITH CONTRAST TECHNIQUE: Multidetector CT imaging of the abdomen and pelvis was performed using the standard protocol following bolus administration of intravenous contrast. CONTRAST:  63m OMNIPAQUE IOHEXOL 350 MG/ML SOLN COMPARISON:  None. FINDINGS: Lower chest: Trace to small volume right and trace left pleural effusions with associated passive atelectasis of the lower lobes. Slightly heterogeneous consolidation along the left lower lobe atelectasis. Cardiac leads are noted. Hepatobiliary: Similar-appearing fluid density lesions within the liver likely representing simple hepatic cysts. Subcentimeter hypodensities are too small to characterize. No focal liver abnormality. Cholecystostomy tube terminates within the gallbladder lumen. Pericholecystic fluid is noted. No definite CT evidence of gallbladder wall thickening. No biliary ductal dilatation. Pancreas: No focal lesion. Normal pancreatic contour. No surrounding inflammatory changes. No main pancreatic ductal dilatation. Spleen: Normal in size without focal abnormality. A splenule is noted. Adrenals/Urinary Tract: No adrenal nodule bilaterally. Bilateral kidneys enhance symmetrically. The meter subcentimeter hypodensities too small to characterize. No hydronephrosis. No hydroureter. The urinary bladder is unremarkable. On delayed imaging, there is no urothelial wall thickening and there are no filling defects in the opacified portions of the bilateral collecting systems or ureters. Stomach/Bowel: PO contrast reaches the cecum. Stomach is within normal limits. No evidence of bowel wall thickening or dilatation. Appendix appears normal. Vascular/Lymphatic: No abdominal aorta or iliac aneurysm.  Severe atherosclerotic plaque of the aorta and its branches. No abdominal, pelvic, or inguinal lymphadenopathy. Reproductive: Uterus and bilateral adnexa are unremarkable. Other: No intraperitoneal free fluid. No intraperitoneal free gas. No organized fluid collection. Musculoskeletal: Small fat containing umbilical hernia (123456). Superficial subcutaneus soft tissue densities along the back that may represent sebaceous cysts. Soft tissue density within the right breast with associated biopsy marker. No suspicious lytic or blastic osseous lesions. No acute displaced fracture. Multilevel degenerative changes of the spine. IMPRESSION: 1. Bilateral trace to small volume pleural effusions, right greater than left. Infection/inflammation of left lower lobe is not excluded. 2. Cholecystostomy tube within the gallbladder lumen. Associated pericholecystic fluid. No definite CT evidence of gallbladder wall thickening. Electronically Signed   By: Iven Finn M.D.   On: 08/22/2021 23:45   DG Chest Port 1 View  Result Date: 08/21/2021 CLINICAL DATA:  Shortness of breath EXAM: PORTABLE CHEST 1 VIEW COMPARISON:  Chest x-rays dated 07/04/2021 and 07/01/2021. FINDINGS: Ill-defined opacity at the LEFT lung base. Probable small bilateral pleural effusions. No pneumothorax is seen. Stable cardiomegaly. LEFT chest wall pacemaker/ICD apparatus appears stable in position. Osseous structures about the chest are unremarkable. IMPRESSION: 1. Ill-defined opacity at the LEFT lung base, pneumonia versus atelectasis. 2. Probable small bilateral pleural effusions. 3. Stable cardiomegaly. Electronically Signed   By: Franki Cabot M.D.   On: 08/21/2021 13:38   ECHOCARDIOGRAM COMPLETE  Result Date: 08/24/2021    ECHOCARDIOGRAM REPORT   Patient Name:   Natalie Castro Date of Exam: 08/23/2021 Medical Rec #:  [id number]    Height:       65.0 in Accession #:    [id number]   Weight:       204.6 lb Date of Birth:  Dec 31, 1940    BSA:          1.997  m Patient Age:    80 years     BP:           140/59 mmHg Patient Gender: F            HR:           70 bpm. Exam Location:  Inpatient Procedure: 2D Echo, Cardiac Doppler and Color Doppler Indications:    Bacteremia  History:        Patient has prior history of Echocardiogram examinations, most  recent 06/26/2021. Pacemaker; Risk Factors:Hypertension,                 Dyslipidemia and Diabetes. H/o pulmonary embolus.  Sonographer:    Clayton Lefort RDCS (AE) Referring Phys: [id number] ANAND D [place]  1. Left ventricular ejection fraction, by estimation, is 50%. The left ventricle demonstrates regional wall motion abnormalities with inferolateral hypokinesis. 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 Left ventricular diastolic parameters are consistent with Grade II diastolic dysfunction (pseudonormalization).  2. Right ventricular systolic function is normal. The right ventricular size is normal. There is moderately elevated pulmonary artery systolic pressure. The estimated right ventricular systolic pressure is 99991111 mmHg. Mildly D-shaped septum suggestive of  RV pressure/volume overload.  3. Left atrial size was mildly dilated.  4. Right atrial size was mildly dilated.  5. The mitral valve is abnormal. Moderate mitral valve regurgitation. No evidence of mitral stenosis.  6. The tricuspid valve is abnormal. Tricuspid valve regurgitation is moderate, may be triggered by tethering from pacemaker.  7. The aortic valve is tricuspid. Aortic valve regurgitation is not visualized. Mild aortic valve sclerosis is present, with no evidence of aortic valve stenosis.  8. The inferior vena cava is dilated in size with >50% respiratory variability, suggesting right atrial pressure of 8 mmHg. FINDINGS  Left Ventricle: Left ventricular ejection fraction, by estimation, is 50%. The left ventricle has mildly decreased function. The left ventricle demonstrates regional wall motion abnormalities. The left  ventricular internal cavity size was normal in size. 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 Left ventricular diastolic parameters are consistent with Grade II diastolic dysfunction (pseudonormalization). Right Ventricle: The right ventricular size is normal. No increase in right ventricular wall thickness. Right ventricular systolic function is normal. There is moderately elevated pulmonary artery systolic pressure. The tricuspid regurgitant velocity is 3.35 m/s, and with an assumed right atrial pressure of 8 mmHg, the estimated right ventricular systolic pressure is 99991111 mmHg. Left Atrium: Left atrial size was mildly dilated. Right Atrium: Right atrial size was mildly dilated. Pericardium: There is no evidence of pericardial effusion. Mitral Valve: The mitral valve is abnormal. Moderate mitral valve regurgitation. No evidence of mitral valve stenosis. MV peak gradient, 6.9 mmHg. The mean mitral valve gradient is 3.0 mmHg. Tricuspid Valve: The tricuspid valve is abnormal. Tricuspid valve regurgitation is moderate. Aortic Valve: The aortic valve is tricuspid. Aortic valve regurgitation is not visualized. Mild aortic valve sclerosis is present, with no evidence of aortic valve stenosis. Aortic valve mean gradient measures 5.0 mmHg. Aortic valve peak gradient measures 9.1 mmHg. Aortic valve area, by VTI measures 1.34 cm. Pulmonic Valve: The pulmonic valve was normal in structure. Pulmonic valve regurgitation is not visualized. Aorta: The aortic root is normal in size and structure. Venous: The inferior vena cava is dilated in size with greater than 50% respiratory variability, suggesting right atrial pressure of 8 mmHg. IAS/Shunts: No atrial level shunt detected by color flow Doppler. Additional Comments: A device lead is visualized in the right ventricle.  LEFT VENTRICLE PLAX 2D LVIDd:         4.60 cm  Diastology LVIDs:         3.40 cm  LV e' medial:    7.51 cm/s LV PW:         1.40 cm  LV E/e' medial:  15.4  LV IVS:        1.70 cm  LV e' lateral:   10.40 cm/s LVOT diam:     1.80 cm  LV E/e'  lateral: 11.2 LV SV:         40 LV SV Index:   20 LVOT Area:     2.54 cm  RIGHT VENTRICLE             IVC RV Basal diam:  3.30 cm     IVC diam: 2.30 cm RV S prime:     11.20 cm/s TAPSE (M-mode): 2.6 cm LEFT ATRIUM           Index       RIGHT ATRIUM           Index LA diam:      4.10 cm 2.05 cm/m  RA Area:     17.90 cm LA Vol (A2C): 52.7 ml 26.39 ml/m RA Volume:   52.40 ml  26.24 ml/m LA Vol (A4C): 66.5 ml 33.30 ml/m  AORTIC VALVE AV Area (Vmax):    1.30 cm AV Area (Vmean):   1.27 cm AV Area (VTI):     1.34 cm AV Vmax:           151.00 cm/s AV Vmean:          105.000 cm/s AV VTI:            0.301 m AV Peak Grad:      9.1 mmHg AV Mean Grad:      5.0 mmHg LVOT Vmax:         77.20 cm/s LVOT Vmean:        52.200 cm/s LVOT VTI:          0.158 m LVOT/AV VTI ratio: 0.52  AORTA Ao Root diam: 3.10 cm Ao Asc diam:  3.00 cm MITRAL VALVE                 TRICUSPID VALVE MV Area (PHT): 3.37 cm      TR Peak grad:   44.9 mmHg MV Area VTI:   1.11 cm      TR Vmax:        335.00 cm/s MV Peak grad:  6.9 mmHg MV Mean grad:  3.0 mmHg      SHUNTS MV Vmax:       1.31 m/s      Systemic VTI:  0.16 m MV Vmean:      73.9 cm/s     Systemic Diam: 1.80 cm MV Decel Time: 225 msec MR Peak grad:    111.1 mmHg MR Mean grad:    68.0 mmHg MR Vmax:         527.00 cm/s MR Vmean:        389.0 cm/s MR PISA:         1.57 cm MR PISA Eff ROA: 9 mm MR PISA Radius:  0.50 cm MV E velocity: 116.00 cm/s MV A velocity: 95.40 cm/s MV E/A ratio:  1.22 Dalton McleanMD Electronically signed by Franki Monte Signature Date/Time: 08/24/2021/8:51:36 AM    Final    ECHO TEE  Result Date: 08/27/2021    TRANSESOPHOGEAL ECHO REPORT   Patient Name:   Natalie Castro Date of Exam: 08/27/2021 Medical Rec #:  [id number]    Height:       65.0 in Accession #:    [id number]   Weight:       196.5 lb Date of Birth:  1941/11/19    BSA:          1.963 m Patient Age:    80 years     BP:  152/69 mmHg Patient Gender: F            HR:           70 bpm. Exam Location:  Inpatient Procedure: Transesophageal Echo, Color Doppler and Cardiac Doppler Indications:     Bacteremia  History:         Patient has prior history of Echocardiogram examinations, most                  recent 08/24/2021. CHF, Pacemaker; Risk Factors:Hypertension,                  Diabetes and Dyslipidemia.  Sonographer:     Raquel Sarna Senior RDCS Referring Phys:  [place] Diagnosing Phys: Lyman Bishop MD PROCEDURE: After discussion of the risks and benefits of a TEE, an informed consent was obtained from the patient. The transesophogeal probe was passed without difficulty through the esophogus of the patient. Sedation performed by different physician. The patient was monitored while under deep sedation. Anesthestetic sedation was provided intravenously by Anesthesiology: '143mg'$  of Propofol. The patient developed no complications during the procedure. IMPRESSIONS  1. Left ventricular ejection fraction, by estimation, is 50 to 55%. The left ventricle has low normal function. 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  2. Right ventricular systolic function is low normal. The right ventricular size is normal.  3. Left atrial size was mildly dilated. No left atrial/left atrial appendage thrombus was detected.  4. Right atrial size was mildly dilated.  5. The mitral valve is abnormal. Mild mitral valve regurgitation.  6. The tricuspid valve is abnormal. Tricuspid valve regurgitation is mild to moderate.  7. The aortic valve is tricuspid. Aortic valve regurgitation is not visualized. Conclusion(s)/Recommendation(s): No evidence of vegetation/infective endocarditis on this transesophageal echocardiogram. FINDINGS  Left Ventricle: Left ventricular ejection fraction, by estimation, is 50 to 55%. The left ventricle has low normal function. The left ventricular internal cavity size was normal in size. There is moderate left ventricular  hypertrophy. Right Ventricle: The right ventricular size is normal. No increase in right ventricular wall thickness. Right ventricular systolic function is low normal. Left Atrium: Left atrial size was mildly dilated. No left atrial/left atrial appendage thrombus was detected. Right Atrium: Right atrial size was mildly dilated. Pericardium: There is no evidence of pericardial effusion. Mitral Valve: The mitral valve is abnormal. There is mild thickening of the mitral valve leaflet(s). Mild mitral valve regurgitation. Tricuspid Valve: The tricuspid valve is abnormal. Tricuspid valve regurgitation is mild to moderate. Aortic Valve: The aortic valve is tricuspid. Aortic valve regurgitation is not visualized. Pulmonic Valve: The pulmonic valve was grossly normal. Pulmonic valve regurgitation is not visualized. Aorta: The aortic root and ascending aorta are structurally normal, with no evidence of dilitation. IAS/Shunts: No atrial level shunt detected by color flow Doppler. Additional Comments: A device lead is visualized. Lyman Bishop MD Electronically signed by Lyman Bishop MD Signature Date/Time: 08/27/2021/3:31:54 PM    Final    CUP PACEART REMOTE DEVICE CHECK  Result Date: 08/18/2021 Scheduled remote reviewed. Normal device function.  14 AMS, atrial arrhythmia all < 37mn 14sec.  Eliquis prescribed. Next remote 91 days. LR  IR EXCHANGE BILIARY DRAIN  Result Date: 08/23/2021 INDICATION: 80year old with history of cholecystitis and status post percutaneous cholecystostomy tube placement. Patient has recurrent E.coli bacteremia. Plan for cholecystostomy tube exchange. EXAM: CHOLECYSTOSTOMY TUBE EXCHANGE WITH FLUOROSCOPY MEDICATIONS: Local anesthetic 1% lidocaine ANESTHESIA/SEDATION: None COMPLICATIONS: None immediate. PROCEDURE: Informed written consent was obtained from the patient after  a thorough discussion of the procedural risks, benefits and alternatives. All questions were addressed. Maximal Sterile  Barrier Technique was utilized including caps, mask, sterile gowns, sterile gloves, sterile drape, hand hygiene and skin antiseptic. A timeout was performed prior to the initiation of the procedure. The existing catheter was injected with contrast. The retention suture was cut. The catheter was cut and removed over a Bentson wire. A new 12 French drain was advanced over the wire but there was concern that the drain was not completely within the gallbladder. Therefore, Kumpe catheter was advanced further into the gallbladder and the 12 French drain was again advanced over the wire. Tip of the catheter is near the gallbladder base. Contrast was injected. Catheter was flushed with saline and attached to a gravity bag. Skin was anesthetized with 1% lidocaine and the catheter was sutured to skin. FINDINGS: New 59 French drain is well positioned in the gallbladder. The cystic duct and common bile duct are patent. Mild dilatation of the extrahepatic biliary system. IMPRESSION: 1. Successful exchange of the cholecystostomy tube. 2. Cystic duct and common bile duct are patent. Electronically Signed   By: Markus Daft M.D.   On: 08/23/2021 16:15    Microbiology: Recent Results (from the past 240 hour(s))  Culture, blood (routine x 2)     Status: Abnormal   Collection Time: 08/21/21  1:29 PM   Specimen: Right Antecubital; Blood  Result Value Ref Range Status   Specimen Description   Final    RIGHT ANTECUBITAL Performed at [facility], [address] [place], [place] [address]    Special Requests   Final    BOTTLES DRAWN AEROBIC AND ANAEROBIC Blood Culture adequate volume Performed at [facility], [address], [place], [place] [address]    Culture  Setup Time   Final    IN BOTH AEROBIC AND ANAEROBIC BOTTLES GRAM NEGATIVE RODS CRITICAL RESULT CALLED TO, READ BACK BY AND VERIFIED WITH: Renold Genta OR:[id number] 08/22/2021 [place]LE[place],R    Culture (A)  Final    ESCHERICHIA COLI SUSCEPTIBILITIES PERFORMED ON PREVIOUS  CULTURE WITHIN THE LAST 5 DAYS. Performed at [facility], [place] [address] [place], [place] [address]    Report Status 08/24/2021 FINAL  Final  Resp Panel by RT-PCR (Flu A&B, Covid) Nasopharyngeal Swab     Status: None   Collection Time: 08/21/21  1:36 PM   Specimen: Nasopharyngeal Swab; Nasopharyngeal(NP) swabs in vial transport medium  Result Value Ref Range Status   SARS Coronavirus 2 by RT PCR NEGATIVE NEGATIVE Final    Comment: (NOTE) SARS-CoV-2 target nucleic acids are NOT DETECTED.  The SARS-CoV-2 RNA is generally detectable in upper respiratory specimens during the acute phase of infection. The lowest concentration of SARS-CoV-2 viral copies this assay can detect is 138 copies/mL. A negative result does not preclude SARS-Cov-2 infection and should not be used as the sole basis for treatment or other patient management decisions. A negative result may occur with  improper specimen collection/handling, submission of specimen other than nasopharyngeal swab, presence of viral mutation(s) within the areas targeted by this assay, and inadequate number of viral copies(<138 copies/mL). A negative result must be combined with clinical observations, patient history, and epidemiological information. The expected result is Negative.  Fact Sheet for Patients:  EntrepreneurPulse.com.au  Fact Sheet for Healthcare Providers:  IncredibleEmployment.be  This test is no t yet approved or cleared by the [place] FDA and  has been authorized for detection and/or diagnosis of SARS-CoV-2 by FDA under  an Emergency Use Authorization (EUA). This EUA will remain  in effect (meaning this test can be used) for the duration of the COVID-19 declaration under Section 564(b)(1) of the Act, 21 U.S.C.section 360bbb-3(b)(1), unless the authorization is terminated  or revoked sooner.       Influenza A by PCR NEGATIVE NEGATIVE Final   Influenza B by PCR  NEGATIVE NEGATIVE Final    Comment: (NOTE) The Xpert Xpress SARS-CoV-2/FLU/RSV plus assay is intended as an aid in the diagnosis of influenza from Nasopharyngeal swab specimens and should not be used as a sole basis for treatment. Nasal washings and aspirates are unacceptable for Xpert Xpress SARS-CoV-2/FLU/RSV testing.  Fact Sheet for Patients: EntrepreneurPulse.com.au  Fact Sheet for Healthcare Providers: IncredibleEmployment.be  This test is not yet approved or cleared by the [place] FDA and has been authorized for detection and/or diagnosis of SARS-CoV-2 by FDA under an Emergency Use Authorization (EUA). This EUA will remain in effect (meaning this test can be used) for the duration of the COVID-19 declaration under Section 564(b)(1) of the Act, 21 U.S.C. section 360bbb-3(b)(1), unless the authorization is terminated or revoked.  Performed at [facility], [address], [place], [place] [address]   Culture, blood (routine x 2)     Status: Abnormal   Collection Time: 08/21/21  1:37 PM   Specimen: Left Antecubital; Blood  Result Value Ref Range Status   Specimen Description   Final    LEFT ANTECUBITAL Performed at [facility], [address], [place], [place] [address]    Special Requests   Final    BOTTLES DRAWN AEROBIC AND ANAEROBIC Blood Culture adequate volume Performed at [facility], [address], [place],  [address]    Culture  Setup Time   Final    AEROBIC AND ANAEROBIC BOTTLE GRAM NEGATIVE RODS CRITICAL RESULT CALLED TO, READ BACK BY AND VERIFIED WITH: [place],E [id number] 08/22/2021 COLEMAN,R GRAM STAIN REVIEWED-AGREE WITH RESULT CRITICAL RESULT CALLED TO, READ BACK BY AND VERIFIED WITH: PHARMD JAMES LEDFORD 08/22/21'@6'$ :01 BY TW Performed at [facility], [place] [address], [place], [place] [address]    Culture ESCHERICHIA COLI (A)  Final   Report Status 08/24/2021 FINAL  Final   Organism ID, Bacteria ESCHERICHIA  COLI  Final      Susceptibility   Escherichia coli - MIC*    AMPICILLIN 8 SENSITIVE Sensitive     CEFAZOLIN <=4 SENSITIVE Sensitive     CEFEPIME <=0.12 SENSITIVE Sensitive     CEFTAZIDIME <=1 SENSITIVE Sensitive     CEFTRIAXONE <=0.25 SENSITIVE Sensitive     CIPROFLOXACIN <=0.25 SENSITIVE Sensitive     GENTAMICIN <=1 SENSITIVE Sensitive     IMIPENEM <=0.25 SENSITIVE Sensitive     TRIMETH/SULFA <=20 SENSITIVE Sensitive     AMPICILLIN/SULBACTAM 4 SENSITIVE Sensitive     PIP/TAZO <=4 SENSITIVE Sensitive     * ESCHERICHIA COLI  Blood Culture ID Panel (Reflexed)     Status: Abnormal   Collection Time: 08/21/21  1:37 PM  Result Value Ref Range Status   Enterococcus faecalis NOT DETECTED NOT DETECTED Final   Enterococcus Faecium NOT DETECTED NOT DETECTED Final   Listeria monocytogenes NOT DETECTED NOT DETECTED Final   Staphylococcus species NOT DETECTED NOT DETECTED Final   Staphylococcus aureus (BCID) NOT DETECTED NOT DETECTED Final   Staphylococcus epidermidis NOT DETECTED NOT DETECTED Final   Staphylococcus lugdunensis NOT DETECTED NOT DETECTED Final   Streptococcus species NOT DETECTED NOT DETECTED Final   Streptococcus agalactiae NOT DETECTED NOT  DETECTED Final   Streptococcus pneumoniae NOT DETECTED NOT DETECTED Final   Streptococcus pyogenes NOT DETECTED NOT DETECTED Final   A.calcoaceticus-baumannii NOT DETECTED NOT DETECTED Final   Bacteroides fragilis NOT DETECTED NOT DETECTED Final   Enterobacterales DETECTED (A) NOT DETECTED Final    Comment: Enterobacterales represent a large order of gram negative bacteria, not a single organism. CRITICAL RESULT CALLED TO, READ BACK BY AND VERIFIED WITH: PHARMD JAMES LEDFORD 08/22/21'@6'$ :01 BY TW    Enterobacter cloacae complex NOT DETECTED NOT DETECTED Final   Escherichia coli DETECTED (A) NOT DETECTED Final    Comment: CRITICAL RESULT CALLED TO, READ BACK BY AND VERIFIED WITH: PHARMD JAMES LEDFORD 08/22/21'@6'$ :01 BY TW    Klebsiella  aerogenes NOT DETECTED NOT DETECTED Final   Klebsiella oxytoca NOT DETECTED NOT DETECTED Final   Klebsiella pneumoniae NOT DETECTED NOT DETECTED Final   Proteus species NOT DETECTED NOT DETECTED Final   Salmonella species NOT DETECTED NOT DETECTED Final   Serratia marcescens NOT DETECTED NOT DETECTED Final   Haemophilus influenzae NOT DETECTED NOT DETECTED Final   Neisseria meningitidis NOT DETECTED NOT DETECTED Final   Pseudomonas aeruginosa NOT DETECTED NOT DETECTED Final   Stenotrophomonas maltophilia NOT DETECTED NOT DETECTED Final   Candida albicans NOT DETECTED NOT DETECTED Final   Candida auris NOT DETECTED NOT DETECTED Final   Candida glabrata NOT DETECTED NOT DETECTED Final   Candida krusei NOT DETECTED NOT DETECTED Final   Candida parapsilosis NOT DETECTED NOT DETECTED Final   Candida tropicalis NOT DETECTED NOT DETECTED Final   Cryptococcus neoformans/gattii NOT DETECTED NOT DETECTED Final   CTX-M ESBL NOT DETECTED NOT DETECTED Final   Carbapenem resistance IMP NOT DETECTED NOT DETECTED Final   Carbapenem resistance KPC NOT DETECTED NOT DETECTED Final   Carbapenem resistance NDM NOT DETECTED NOT DETECTED Final   Carbapenem resist OXA 48 LIKE NOT DETECTED NOT DETECTED Final   Carbapenem resistance VIM NOT DETECTED NOT DETECTED Final    Comment: Performed at [facility], [address] [place], [place] [address]  Urine Culture     Status: Abnormal   Collection Time: 08/21/21  3:53 PM   Specimen: Urine, Clean Catch  Result Value Ref Range Status   Specimen Description   Final    URINE, CLEAN CATCH Performed at [facility], [address], [place], [place] [address]    Special Requests   Final    NONE Performed at [facility], [address], [place], [place] [address]    Culture (A)  Final    30,000 COLONIES/mL MULTIPLE SPECIES PRESENT, SUGGEST RECOLLECTION   Report Status 08/23/2021 FINAL  Final  Culture, blood (routine x 2)     Status: None   Collection Time:  08/23/21 10:22 AM   Specimen: BLOOD RIGHT HAND  Result Value Ref Range Status   Specimen Description BLOOD RIGHT HAND  Final   Special Requests   Final    BOTTLES DRAWN AEROBIC ONLY Blood Culture results may not be optimal due to an inadequate volume of blood received in culture bottles   Culture   Final    NO GROWTH 5 DAYS Performed at [facility], [place] [address], [place], [place] [address]    Report Status 08/28/2021 FINAL  Final  Culture, blood (routine x 2)     Status: None   Collection Time: 08/23/21 10:23 AM   Specimen: BLOOD  Result Value Ref Range Status   Specimen Description BLOOD LEFT ANTECUBITAL  Final  Special Requests   Final    BOTTLES DRAWN AEROBIC ONLY Blood Culture results may not be optimal due to an inadequate volume of blood received in culture bottles   Culture   Final    NO GROWTH 5 DAYS Performed at [facility], [place] [address], [place], [place] [address]    Report Status 08/28/2021 FINAL  Final     Labs: Basic Metabolic Panel: Recent Labs  Lab 08/21/21 1336 08/22/21 [id number] 08/22/21 [id number] 08/23/21 0242 08/23/21 1556 08/24/21 0306 08/25/21 0259 08/26/21 0333 08/27/21 0352  NA 139 139  --  136  --   --  137 137  --   K 3.6 3.2*  --  3.5  --   --  4.4 4.0  --   CL 105 104  --  103  --   --  102 102  --   CO2 26 29  --  27  --   --  28 27  --   GLUCOSE 169* 154*  --  115*  --   --  121* 114*  --   BUN 9 9  --  11  --   --  9 10  --   CREATININE 0.73 0.81  --  0.69  --   --  0.61 0.79  --   CALCIUM 8.3* 8.2*  --  8.1*  --   --  9.0 8.9  --   MG  --  1.4*   < >  --  1.5* 2.3 1.9 1.6* 1.8  PHOS  --  4.2  --   --   --   --   --   --   --    < > = values in this interval not displayed.   Liver Function Tests: Recent Labs  Lab 08/21/21 1336 08/22/21 0638 08/23/21 0242  AST 16  --  19  ALT 13  --  13  ALKPHOS 48  --  40  BILITOT 0.8  --  0.7  PROT 7.6  --  5.9*  ALBUMIN 3.4* 2.6* 2.3*   No results for input(s): LIPASE, AMYLASE in  the last 168 hours. No results for input(s): AMMONIA in the last 168 hours. CBC: Recent Labs  Lab 08/21/21 1336 08/22/21 0638 08/23/21 0242 08/25/21 0259 08/26/21 0333  WBC 8.8 7.5 7.9 6.1 6.3  NEUTROABS 7.1  --   --   --   --   HGB 11.3* 9.5* 9.2* 9.6* 10.0*  HCT 36.3 31.1* 29.3* 30.0* 31.1*  MCV 98.4 96.9 97.3 96.2 94.8  PLT 222 176 162 197 211   Cardiac Enzymes: No results for input(s): CKTOTAL, CKMB, CKMBINDEX, TROPONINI in the last 168 hours. BNP: BNP (last 3 results) Recent Labs    08/21/21 1336 08/22/21 0638  BNP 485.0* 322.9*    ProBNP (last 3 results) No results for input(s): PROBNP in the last 8760 hours.  CBG: Recent Labs  Lab 08/27/21 1115 08/27/21 1705 08/27/21 2111 08/28/21 0607 08/28/21 1124  GLUCAP 115* 156* 221* 111* 211*       Signed:  Cristal Deer, MD [facility] 08/28/2021, 12:31 PM

## 2021-08-28 NOTE — Progress Notes (Signed)
Patient discharged: Home with family  Via: Wheelchair   Discharge paperwork given: to patient and family  Reviewed with teach back  IV and telemetry disconnected  Belongings given to patient    

## 2021-08-28 NOTE — Progress Notes (Signed)
Pt is active with [facility]. Notified Georgina Snell with [facility] that pt has been D/C today.

## 2021-08-30 ENCOUNTER — Encounter (HOSPITAL_COMMUNITY): Payer: Self-pay | Admitting: Internal Medicine

## 2021-08-31 ENCOUNTER — Telehealth: Payer: Self-pay | Admitting: Internal Medicine

## 2021-08-31 NOTE — Telephone Encounter (Signed)
Patient's daughter, [name], would like to know if he has any advisement prior to patient's appointment on 10/12/21 based on recent ED visit.

## 2021-08-31 NOTE — Telephone Encounter (Signed)
See below--[name] states she is at work and requests that a call be returned to the patient at [phone].

## 2021-09-01 NOTE — Telephone Encounter (Signed)
Returned call to Pt.  Advised per Dr. Hedy Jacob should get her gall bladder out.  Pt states she is scheduled for procedure.  Advised to keep appointment scheduled in October-call if any issues prior.

## 2021-09-02 ENCOUNTER — Other Ambulatory Visit: Payer: Medicare Other

## 2021-09-02 NOTE — Progress Notes (Signed)
Remote pacemaker transmission.   

## 2021-09-06 ENCOUNTER — Telehealth: Payer: Self-pay | Admitting: Internal Medicine

## 2021-09-06 NOTE — Telephone Encounter (Signed)
Pt c/o BP issue: STAT if pt c/o blurred vision, one-sided weakness or slurred speech  1. What are your last 5 BP readings?  170/110 today with moderate exertion  150-160/90 normal range   2. Are you having any other symptoms (ex. Dizziness, headache, blurred vision, passed out)? Asymptomatic   3. What is your BP issue? Patient had elevated BP at home visit today.

## 2021-09-06 NOTE — Telephone Encounter (Signed)
Instruct the patient to increase toprol xl to 50 mg twice daily. She will need a new script.

## 2021-09-06 NOTE — Telephone Encounter (Signed)
Left message for home health PT to call back.

## 2021-09-06 NOTE — Telephone Encounter (Signed)
Spoke with Clair Gulling who called as an FYI that the patient's BP was elevated more than usually at his visit with her today. He reports that normally she runs 160/90. Today she was around 170/110. She is asymptomatic and reports taking her medications as prescribed.

## 2021-09-06 NOTE — Telephone Encounter (Signed)
Natalie Castro is returning call.

## 2021-09-07 ENCOUNTER — Other Ambulatory Visit: Payer: Medicare Other

## 2021-09-07 ENCOUNTER — Telehealth: Payer: Self-pay | Admitting: Internal Medicine

## 2021-09-07 MED ORDER — METOPROLOL SUCCINATE ER 50 MG PO TB24: 50.0000 mg | ORAL_TABLET | Freq: Two times a day (BID) | ORAL | 3 refills | Status: DC

## 2021-09-07 NOTE — Telephone Encounter (Signed)
Daughter called to verify new Metoprolol dose. Advised Dr. Lovena Le recommended increasing to 50 mg BID. Daughter verbalized understanding.

## 2021-09-07 NOTE — Telephone Encounter (Signed)
Spoke with the patient and gave him recommendations from Dr. Lovena Le. She is agreeable to increase Toprol XL to 50 mg BID.

## 2021-09-07 NOTE — Telephone Encounter (Signed)
Pt c/o medication issue:  1. Name of Medication:   nitroGLYCERIN (NITROSTAT) 0.4 MG SL tablet    2. How are you currently taking this medication (dosage and times per day)? Place 1 tablet (0.4 mg total) under the tongue every 5 (five) minutes as needed for chest pain.  3. Are you having a reaction (difficulty breathing--STAT)? yes  4. What is your medication issue? Pt has questions about dosage of thi med.

## 2021-09-14 ENCOUNTER — Other Ambulatory Visit: Payer: Medicare Other

## 2021-09-22 ENCOUNTER — Ambulatory Visit: Payer: Medicare Other | Admitting: Podiatry

## 2021-09-22 ENCOUNTER — Ambulatory Visit
Admission: RE | Admit: 2021-09-22 | Discharge: 2021-09-22 | Disposition: A | Discharge status: Home / Self Care | Payer: Medicare Other | Source: Ambulatory Visit | Attending: Student | Admitting: Student

## 2021-09-22 ENCOUNTER — Ambulatory Visit
Admission: RE | Admit: 2021-09-22 | Discharge: 2021-09-22 | Disposition: A | Discharge status: Home / Self Care | Payer: Medicare Other | Source: Ambulatory Visit | Attending: General Surgery | Admitting: General Surgery

## 2021-09-22 DIAGNOSIS — K810 Acute cholecystitis: Secondary | ICD-10-CM

## 2021-09-22 HISTORY — PX: IR RADIOLOGIST EVAL & MGMT: IMG5224

## 2021-09-28 NOTE — TOC Transition Note (Signed)
Transition of Care [facility]) - CM/SW Discharge Note   Patient Details  Name: Natalie Castro MRN: [id number] Date of Birth: 1941-08-29  Transition of Care [facility] CM/SW Contact:  Memory Argue Phone Number: 09/28/2021, 1:19 PM   Clinical Narrative:       Final next level of care: [place] Barriers to Discharge: No Barriers Identified   Patient Goals and CMS Choice Patient states their goals for this hospitalization and ongoing recovery are:: return home- going home w/ daughter CMS Medicare.gov Compare Post Acute Care list provided to:: Patient Choice offered to / list presented to : Patient, Adult Children  Discharge Placement                       Discharge Plan and Services   Discharge Planning Services: CM Consult Post Acute Care Choice: Durable Medical Equipment, Home Health          DME Arranged: 3-N-1, Hospital bed DME Agency: [employer] Date DME Agency Contacted: 07/11/21 Time DME Agency Contacted: 43 Representative spoke with at DME Agency: [place]: RN, PT, Nurse's Aide, Social Work [employer] Agency: [place] Date [place]: 07/11/21 Time [place]: 1250 Representative spoke with at [place]: [place] ([place]) Interventions     Readmission Risk Interventions Readmission Risk Prevention Plan 07/11/2021  Transportation Screening Complete  PCP or Specialist Appt within 5-7 Days Complete  Home Care Screening Complete  Medication Review (RN CM) Complete  Some recent data might be hidden

## 2021-09-29 LAB — HM DIABETES EYE EXAM

## 2021-10-12 ENCOUNTER — Other Ambulatory Visit: Payer: Self-pay

## 2021-10-12 ENCOUNTER — Ambulatory Visit: Payer: Medicare Other | Admitting: Internal Medicine

## 2021-10-12 VITALS — BP 142/94 | HR 63 | Ht 65.0 in | Wt 200.4 lb

## 2021-10-12 DIAGNOSIS — I10 Essential (primary) hypertension: Secondary | ICD-10-CM | POA: Diagnosis not present

## 2021-10-12 DIAGNOSIS — I442 Atrioventricular block, complete: Secondary | ICD-10-CM | POA: Diagnosis not present

## 2021-10-12 NOTE — Patient Instructions (Signed)
Medication Instructions:  Your physician recommends that you continue on your current medications as directed. Please refer to the Current Medication list given to you today.  Labwork: None ordered.  Testing/Procedures: None ordered.  Follow-Up: Your physician wants you to follow-up in: one year with Cristopher Peru, MD or one of the following Advanced Practice Providers on your designated Care Team:   Tommye Standard, [place] Legrand Como "Jonni Sanger" Chalmers Cater, [place]  Remote monitoring is used to monitor your Pacemaker from home. This monitoring reduces the number of office visits required to check your device to one time per year. It allows [place] to keep an eye on the functioning of your device to ensure it is working properly. You are scheduled for a device check from home on 11/17/2021. You may send your transmission at any time that day. If you have a wireless device, the transmission will be sent automatically. After your physician reviews your transmission, you will receive a postcard with your next transmission date.  Any Other Special Instructions Will Be Listed Below (If Applicable).  If you need a refill on your cardiac medications before your next appointment, please call your pharmacy.

## 2021-10-12 NOTE — Progress Notes (Signed)
HPI Mrs. Guion presents today for ongoing evaluation and management of her PPM. She is a pleasant 80 yo woman with a h/o HTN and DM who developed syncope and sob and was found to have CHB, and underwent PPM insertion.  She was hospitalized remotely with pulmonary emboli. No chest pain. No bleeding. She admits to dietary indiscretion. She has occaisional palpitations. She was hospitalized with cholecystitis and had a drain placed in the setting of E'coli bacteremia and was treated with anti-biotics. She feels better. She has not had more fever,chills, or night sweats.  Allergies  Allergen Reactions   Penicillins Hives, Itching and Other (See Comments)    Has tolerated ceftriaxone 7/22 Has patient had a PCN reaction causing immediate rash, facial/tongue/throat swelling, SOB or lightheadedness with hypotension: No Has patient had a PCN reaction causing severe rash involving mucus membranes or skin necrosis: Yes Has patient had a PCN reaction that required hospitalization No Has patient had a PCN reaction occurring within the last 10 years: No If all of the above answers are "NO", then may proceed with Cephalosporin use.      Current Outpatient Medications  Medication Sig Dispense Refill   atorvastatin (LIPITOR) 40 MG tablet TAKE (1) TABLET BY MOUTH AT BEDTIME. (Patient taking differently: Take 40 mg by mouth at bedtime.) 30 tablet 0   cefadroxil (DURICEF) 500 MG capsule Take 1 capsule (500 mg total) by mouth 2 (two) times daily. 12 capsule 0   ELIQUIS 5 MG TABS tablet TAKE 1 TABLET BY MOUTH TWICE A DAY. 60 tablet 0   furosemide (LASIX) 40 MG tablet Take 1 tablet (40 mg total) by mouth daily. 90 tablet 0   glucose blood test strip Use to check blood glucose fasting , before lunch and dinner and after your largest meal 120 each 12   guaiFENesin (MUCINEX) 600 MG 12 hr tablet Take 1 tablet (600 mg total) by mouth 2 (two) times daily as needed for cough. 30 tablet 0   Lancets (ONETOUCH  ULTRASOFT) lancets Use to check glucose 4 x daily 120 each 12   LANTUS SOLOSTAR 100 UNIT/ML Solostar Pen ADMINISTER 30 UNITS UNDER THE SKIN AT BEDTIME 30 mL 1   losartan (COZAAR) 100 MG tablet TAKE (1) TABLET BY MOUTH ONCE DAILY. (Patient taking differently: Take 100 mg by mouth daily.) 30 tablet 0   metFORMIN (GLUCOPHAGE) 1000 MG tablet TAKE (1) TABLET BY MOUTH TWICE DAILY. 60 tablet 2   metoprolol succinate (TOPROL-XL) 50 MG 24 hr tablet Take 1 tablet (50 mg total) by mouth 2 (two) times daily. Take with or immediately following a meal. 180 tablet 3   nitroGLYCERIN (NITROSTAT) 0.4 MG SL tablet Place 1 tablet (0.4 mg total) under the tongue every 5 (five) minutes as needed for chest pain. 25 tablet 3   polyethylene glycol (MIRALAX / GLYCOLAX) 17 g packet Take 17 g by mouth daily as needed for mild constipation. 14 each 0   potassium chloride (KLOR-CON) 10 MEQ tablet Take 1 tablet (10 mEq total) by mouth daily. 90 tablet 0   hydrALAZINE (APRESOLINE) 50 MG tablet Take 1 tablet (50 mg total) by mouth 2 (two) times daily. 60 tablet 0   No current facility-administered medications for this visit.     Past Medical History:  Diagnosis Date   Acid reflux    Arthritis    Colon adenomas    AGE 58   Complete heart block (HCC)    STJ PPM Dr. [name] 11/24/15  Essential hypertension    History of pulmonary embolism 2017   unprovoked, long term anticoag with apixaban    Hyperlipidemia    Myoview 03/2021    Myoview 4/22: EF 50, no infarct or ischemia; low risk   Presence of permanent cardiac pacemaker    Type 2 diabetes mellitus ([place])     ROS:   All systems reviewed and negative except as noted in the HPI.   Past Surgical History:  Procedure Laterality Date   COLONOSCOPY     3 SIMPLE ADENOMAS, AGE 89   COLONOSCOPY N/A 05/24/2018   Procedure: COLONOSCOPY;  Surgeon: Danie Binder, MD;  [facility];  Service: Endoscopy;  Laterality: N/A;  1:00pm   EP IMPLANTABLE DEVICE N/A  11/24/2015   Procedure: Pacemaker Implant;  Surgeon: [name], MD;  Location: [place] [facility];  Service: Cardiovascular;  Laterality: N/A;   IR EXCHANGE BILIARY DRAIN  07/27/2021   IR EXCHANGE BILIARY DRAIN  08/23/2021   IR PERC CHOLECYSTOSTOMY  07/08/2021   IR RADIOLOGIST EVAL & MGMT  09/22/2021   JOINT REPLACEMENT     knees bilat.   LEFT HEART CATH AND CORONARY ANGIOGRAPHY N/A 07/02/2021   Procedure: LEFT HEART CATH AND CORONARY ANGIOGRAPHY;  Surgeon: Jettie Booze, MD;  Location: [place] [facility];  Service: Cardiovascular;  Laterality: N/A;   POLYPECTOMY  05/24/2018   Procedure: POLYPECTOMY;  Surgeon: Danie Binder, MD;  [facility];  Service: Endoscopy;;  ascending and hepatic flexure, transverse   TEE WITHOUT CARDIOVERSION N/A 08/27/2021   Procedure: TRANSESOPHAGEAL ECHOCARDIOGRAM (TEE);  Surgeon: Pixie Casino, MD;  Location: [facility] ENDOSCOPY;  Service: Cardiovascular;  Laterality: N/A;   TUBAL LIGATION       Family History  Problem Relation Age of Onset   Heart failure Mother    Hypertension Mother    Heart failure Sister    Hypertension Father    Cancer Sister    Dementia Sister    Colon cancer Neg Hx    Colon polyps Neg Hx      Social History   Socioeconomic History   Marital status: Widowed    Spouse name: Not on file   Number of children: Not on file   Years of education: Not on file   Highest education level: Not on file  Occupational History   Occupation: retired  Tobacco Use   Smoking status: Never   Smokeless tobacco: Never  Vaping Use   Vaping Use: Never used  Substance and Sexual Activity   Alcohol use: No    Alcohol/week: 0.0 standard drinks   Drug use: No   Sexual activity: Yes    Birth control/protection: Post-menopausal  Other Topics Concern   Not on file  Social History Narrative   MARRIED FOR 72 YRS. 5 KIDS: #4 PRESENT TODAY(AGE 23)   Social Determinants of Health   Financial Resource Strain: Not on file  Food  Insecurity: Not on file  Transportation Needs: Not on file  Physical Activity: Not on file  Stress: Not on file  Social Connections: Not on file  Intimate Partner Violence: Not on file     BP (!) 142/94   Pulse 63   Ht 5\' 5"  (1.651 m)   Wt 200 lb 6.4 oz (90.9 kg)   SpO2 97%   BMI 33.35 kg/m   Physical Exam:  Well appearing NAD HEENT: Unremarkable Neck:  No JVD, no thyromegally Lymphatics:  No adenopathy Back:  No CVA tenderness Lungs:  Clear with no wheezes HEART:  Regular rate rhythm, no murmurs, no rubs, no clicks Abd:  soft, positive bowel sounds, no organomegally, no rebound, no guarding Ext:  2 plus pulses, no edema, no cyanosis, no clubbing Skin:  No rashes no nodules Neuro:  CN II through XII intact, motor grossly intact  EKG - nsr with ventricular pacing.   DEVICE  Normal device function.  See PaceArt for details.   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  Volume overload - she is much improved on lasix.  HTN - her bp is elevated but always worse in the doctors office. She is encouraged to avoid salty foods. Cholecystitis - hopefully this will not recurr. She has had her drain removed and is off anti-biotics. If she were to have recurrent bacteremia, then she would need a TEE.  Carleene Overlie Zakariah Dejarnette,MD

## 2021-10-30 ENCOUNTER — Other Ambulatory Visit: Payer: Self-pay

## 2021-10-30 ENCOUNTER — Inpatient Hospital Stay (HOSPITAL_COMMUNITY)
Admission: EM | Admit: 2021-10-30 | Discharge: 2021-11-05 | DRG: 445 | Disposition: A | Discharge status: Home / Self Care | Payer: Medicare Other | Attending: Internal Medicine | Admitting: Internal Medicine

## 2021-10-30 ENCOUNTER — Encounter (HOSPITAL_COMMUNITY): Payer: Self-pay | Admitting: Emergency Medicine

## 2021-10-30 ENCOUNTER — Emergency Department (HOSPITAL_COMMUNITY): Payer: Medicare Other

## 2021-10-30 DIAGNOSIS — Z96653 Presence of artificial knee joint, bilateral: Secondary | ICD-10-CM | POA: Diagnosis present

## 2021-10-30 DIAGNOSIS — I442 Atrioventricular block, complete: Secondary | ICD-10-CM | POA: Diagnosis present

## 2021-10-30 DIAGNOSIS — I110 Hypertensive heart disease with heart failure: Secondary | ICD-10-CM | POA: Diagnosis present

## 2021-10-30 DIAGNOSIS — Z7901 Long term (current) use of anticoagulants: Secondary | ICD-10-CM | POA: Diagnosis not present

## 2021-10-30 DIAGNOSIS — Z950 Presence of cardiac pacemaker: Secondary | ICD-10-CM

## 2021-10-30 DIAGNOSIS — Z86711 Personal history of pulmonary embolism: Secondary | ICD-10-CM | POA: Diagnosis not present

## 2021-10-30 DIAGNOSIS — Z880 Allergy status to penicillin: Secondary | ICD-10-CM

## 2021-10-30 DIAGNOSIS — Z20822 Contact with and (suspected) exposure to covid-19: Secondary | ICD-10-CM | POA: Diagnosis present

## 2021-10-30 DIAGNOSIS — I5032 Chronic diastolic (congestive) heart failure: Secondary | ICD-10-CM | POA: Diagnosis present

## 2021-10-30 DIAGNOSIS — K219 Gastro-esophageal reflux disease without esophagitis: Secondary | ICD-10-CM | POA: Diagnosis present

## 2021-10-30 DIAGNOSIS — R1011 Right upper quadrant pain: Secondary | ICD-10-CM | POA: Diagnosis not present

## 2021-10-30 DIAGNOSIS — E782 Mixed hyperlipidemia: Secondary | ICD-10-CM

## 2021-10-30 DIAGNOSIS — R112 Nausea with vomiting, unspecified: Secondary | ICD-10-CM

## 2021-10-30 DIAGNOSIS — Z79899 Other long term (current) drug therapy: Secondary | ICD-10-CM

## 2021-10-30 DIAGNOSIS — Z7984 Long term (current) use of oral hypoglycemic drugs: Secondary | ICD-10-CM

## 2021-10-30 DIAGNOSIS — K810 Acute cholecystitis: Secondary | ICD-10-CM | POA: Diagnosis present

## 2021-10-30 DIAGNOSIS — Z8249 Family history of ischemic heart disease and other diseases of the circulatory system: Secondary | ICD-10-CM

## 2021-10-30 DIAGNOSIS — Z794 Long term (current) use of insulin: Secondary | ICD-10-CM | POA: Diagnosis not present

## 2021-10-30 DIAGNOSIS — K8020 Calculus of gallbladder without cholecystitis without obstruction: Secondary | ICD-10-CM

## 2021-10-30 DIAGNOSIS — E1165 Type 2 diabetes mellitus with hyperglycemia: Secondary | ICD-10-CM | POA: Diagnosis present

## 2021-10-30 DIAGNOSIS — I10 Essential (primary) hypertension: Secondary | ICD-10-CM

## 2021-10-30 LAB — GLUCOSE, CAPILLARY: Glucose-Capillary: 131 mg/dL — ABNORMAL HIGH (ref 70–99)

## 2021-10-30 LAB — CBC
HCT: 39.4 % (ref 36.0–46.0)
Hemoglobin: 12.6 g/dL (ref 12.0–15.0)
MCH: 31.5 pg (ref 26.0–34.0)
MCHC: 32 g/dL (ref 30.0–36.0)
MCV: 98.5 fL (ref 80.0–100.0)
Platelets: 181 10*3/uL (ref 150–400)
RBC: 4 MIL/uL (ref 3.87–5.11)
RDW: 15.8 % — ABNORMAL HIGH (ref 11.5–15.5)
WBC: 8.8 10*3/uL (ref 4.0–10.5)
nRBC: 0 % (ref 0.0–0.2)

## 2021-10-30 LAB — URINALYSIS, ROUTINE W REFLEX MICROSCOPIC
Bacteria, UA: NONE SEEN
Bilirubin Urine: NEGATIVE
Glucose, UA: NEGATIVE mg/dL
Ketones, ur: NEGATIVE mg/dL
Leukocytes,Ua: NEGATIVE
Nitrite: NEGATIVE
Protein, ur: NEGATIVE mg/dL
Specific Gravity, Urine: 1.011 (ref 1.005–1.030)
pH: 5 (ref 5.0–8.0)

## 2021-10-30 LAB — COMPREHENSIVE METABOLIC PANEL
ALT: 10 U/L (ref 0–44)
AST: 16 U/L (ref 15–41)
Albumin: 3.5 g/dL (ref 3.5–5.0)
Alkaline Phosphatase: 62 U/L (ref 38–126)
Anion gap: 12 (ref 5–15)
BUN: 16 mg/dL (ref 8–23)
CO2: 24 mmol/L (ref 22–32)
Calcium: 9.4 mg/dL (ref 8.9–10.3)
Chloride: 105 mmol/L (ref 98–111)
Creatinine, Ser: 0.83 mg/dL (ref 0.44–1.00)
GFR, Estimated: >60 mL/min (ref >60)
Glucose, Bld: 183 mg/dL — ABNORMAL HIGH (ref 70–99)
Potassium: 3.6 mmol/L (ref 3.5–5.1)
Sodium: 141 mmol/L (ref 135–145)
Total Bilirubin: 0.6 mg/dL (ref 0.3–1.2)
Total Protein: 8.1 g/dL (ref 6.5–8.1)

## 2021-10-30 LAB — LIPASE, BLOOD: Lipase: 34 U/L (ref 11–51)

## 2021-10-30 MED ORDER — POTASSIUM CHLORIDE CRYS ER 10 MEQ PO TBCR
10.0000 meq | EXTENDED_RELEASE_TABLET | Freq: Every day | ORAL | Status: DC
  Administered 2021-10-31 – 2021-11-05 (×6): 10 meq via ORAL
  Filled 2021-10-30 (×8): qty 1

## 2021-10-30 MED ORDER — INSULIN ASPART 100 UNIT/ML IJ SOLN
0.0000 [IU] | Freq: Three times a day (TID) | INTRAMUSCULAR | Status: DC
  Administered 2021-10-31: 2 [IU] via SUBCUTANEOUS
  Administered 2021-10-31: 3 [IU] via SUBCUTANEOUS
  Administered 2021-10-31 – 2021-11-02 (×3): 2 [IU] via SUBCUTANEOUS
  Administered 2021-11-02: 3 [IU] via SUBCUTANEOUS
  Administered 2021-11-03: 5 [IU] via SUBCUTANEOUS
  Administered 2021-11-04: 3 [IU] via SUBCUTANEOUS
  Administered 2021-11-05: 2 [IU] via SUBCUTANEOUS

## 2021-10-30 MED ORDER — METRONIDAZOLE 500 MG/100ML IV SOLN
500.0000 mg | Freq: Once | INTRAVENOUS | Status: DC
  Filled 2021-10-30: qty 100

## 2021-10-30 MED ORDER — METOPROLOL SUCCINATE ER 50 MG PO TB24
50.0000 mg | ORAL_TABLET | Freq: Two times a day (BID) | ORAL | Status: DC
  Administered 2021-10-30 – 2021-11-05 (×10): 50 mg via ORAL
  Filled 2021-10-30 (×11): qty 1

## 2021-10-30 MED ORDER — ONDANSETRON HCL 4 MG/2ML IJ SOLN
4.0000 mg | Freq: Once | INTRAMUSCULAR | Status: AC
  Administered 2021-10-30: 4 mg via INTRAVENOUS
  Filled 2021-10-30: qty 2

## 2021-10-30 MED ORDER — INSULIN GLARGINE-YFGN 100 UNIT/ML ~~LOC~~ SOLN
30.0000 [IU] | Freq: Every day | SUBCUTANEOUS | Status: DC
  Administered 2021-10-31 – 2021-11-04 (×5): 30 [IU] via SUBCUTANEOUS
  Filled 2021-10-30 (×9): qty 0.3

## 2021-10-30 MED ORDER — ATORVASTATIN CALCIUM 40 MG PO TABS
40.0000 mg | ORAL_TABLET | Freq: Every day | ORAL | Status: DC
  Administered 2021-10-30 – 2021-11-04 (×6): 40 mg via ORAL
  Filled 2021-10-30 (×6): qty 1

## 2021-10-30 MED ORDER — FUROSEMIDE 40 MG PO TABS
40.0000 mg | ORAL_TABLET | Freq: Every day | ORAL | Status: DC
  Administered 2021-10-31 – 2021-11-05 (×6): 40 mg via ORAL
  Filled 2021-10-30 (×6): qty 1

## 2021-10-30 MED ORDER — ONDANSETRON HCL 4 MG PO TABS: 4.0000 mg | ORAL_TABLET | Freq: Four times a day (QID) | ORAL | Status: DC | PRN

## 2021-10-30 MED ORDER — NIFEDIPINE ER OSMOTIC RELEASE 30 MG PO TB24
30.0000 mg | ORAL_TABLET | Freq: Every day | ORAL | Status: DC
  Administered 2021-10-31 – 2021-11-05 (×6): 30 mg via ORAL
  Filled 2021-10-30 (×6): qty 1

## 2021-10-30 MED ORDER — SODIUM CHLORIDE 0.9 % IV BOLUS
1000.0000 mL | Freq: Once | INTRAVENOUS | Status: AC
  Administered 2021-10-30: 1000 mL via INTRAVENOUS

## 2021-10-30 MED ORDER — SODIUM CHLORIDE 0.9 % IV SOLN
1.0000 g | Freq: Once | INTRAVENOUS | Status: DC
  Filled 2021-10-30: qty 1

## 2021-10-30 MED ORDER — INSULIN ASPART 100 UNIT/ML IJ SOLN
0.0000 [IU] | Freq: Every day | INTRAMUSCULAR | Status: DC
  Administered 2021-11-03: 2 [IU] via SUBCUTANEOUS

## 2021-10-30 MED ORDER — OXYCODONE HCL 5 MG PO TABS
5.0000 mg | ORAL_TABLET | Freq: Four times a day (QID) | ORAL | Status: DC | PRN
  Administered 2021-11-04 (×2): 5 mg via ORAL
  Filled 2021-10-30 (×2): qty 1

## 2021-10-30 MED ORDER — ONDANSETRON HCL 4 MG/2ML IJ SOLN: 4.0000 mg | Freq: Four times a day (QID) | INTRAMUSCULAR | Status: DC | PRN

## 2021-10-30 MED ORDER — LOSARTAN POTASSIUM 50 MG PO TABS
100.0000 mg | ORAL_TABLET | Freq: Every day | ORAL | Status: DC
  Administered 2021-10-31 – 2021-11-05 (×6): 100 mg via ORAL
  Filled 2021-10-30 (×6): qty 2

## 2021-10-30 MED ORDER — HYDRALAZINE HCL 25 MG PO TABS
50.0000 mg | ORAL_TABLET | Freq: Three times a day (TID) | ORAL | Status: DC
  Administered 2021-10-30 – 2021-11-05 (×15): 50 mg via ORAL
  Filled 2021-10-30 (×16): qty 2

## 2021-10-30 MED ORDER — IOHEXOL 300 MG/ML  SOLN
100.0000 mL | Freq: Once | INTRAMUSCULAR | Status: AC | PRN
  Administered 2021-10-30: 100 mL via INTRAVENOUS

## 2021-10-30 MED ORDER — METRONIDAZOLE 500 MG/100ML IV SOLN
500.0000 mg | Freq: Two times a day (BID) | INTRAVENOUS | Status: DC
  Administered 2021-10-30 – 2021-11-05 (×12): 500 mg via INTRAVENOUS
  Filled 2021-10-30 (×12): qty 100

## 2021-10-30 MED ORDER — SODIUM CHLORIDE 0.9 % IV SOLN
2.0000 g | INTRAVENOUS | Status: DC
  Administered 2021-10-30 – 2021-11-04 (×6): 2 g via INTRAVENOUS
  Filled 2021-10-30 (×7): qty 20

## 2021-10-30 NOTE — ED Provider Notes (Signed)
[facility] EMERGENCY DEPARTMENT Provider Note   CSN: [id number] Arrival date & time: 10/30/21  1811     History Chief Complaint  Patient presents with   Emesis    Natalie Castro is a 80 y.o. female.  Pt presents to the ED today with n/v.  Pt said sx started suddenly this afternoon around 1300.  Pt said she does not have any significant pain.  No fevers.  Pt has a hx of sepsis from her gallbladder.  A drain was placed and since removed, but the gallbladder is still there.      Past Medical History:  Diagnosis Date   Acid reflux    Arthritis    Colon adenomas    AGE 7   Complete heart block (HCC)    STJ PPM Dr. 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 coli ([place]) 07/05/2021   Acute kidney injury ([place]) 07/05/2021   Pancreatic duct dilated    Acute coronary syndrome ([place]) 07/02/2021   Protein-calorie malnutrition, mild ([place]) 07/02/2021   Chest pain 07/02/2021   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   Uncontrolled type 2 diabetes mellitus with hyperglycemia ([place]) 04/06/2020   Mixed hyperlipidemia 11/04/2019   Need for immunization against influenza 11/04/2019   Positive FIT (fecal immunochemical test) 04/26/2018   Lt sided CAP (community acquired pneumonia) 02/22/2017   [place] pulmonary embolus ([place]) 11/04/2016   Elevated troponin I level  11/04/2016   Complete heart block (HCC)/Saint Jude pacemaker 11/23/2015   KNEE, ARTHRITIS, 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   KNEE PAIN 07/20/2009   Essential hypertension, benign 07/20/2009    Past Surgical History:  Procedure Laterality Date   COLONOSCOPY     3 SIMPLE ADENOMAS, AGE 42   COLONOSCOPY N/A 05/24/2018   Procedure: COLONOSCOPY;  Surgeon: Danie Binder, MD;  [facility];  Service: Endoscopy;  Laterality: N/A;  1:00pm   EP IMPLANTABLE DEVICE N/A 11/24/2015   Procedure: Pacemaker Implant;  Surgeon: [name], MD;  Location: [place] [facility];  Service: Cardiovascular;  Laterality: N/A;   IR EXCHANGE BILIARY DRAIN  07/27/2021   IR EXCHANGE BILIARY DRAIN  08/23/2021   IR PERC CHOLECYSTOSTOMY  07/08/2021   IR RADIOLOGIST EVAL & MGMT  09/22/2021   JOINT REPLACEMENT     knees bilat.   LEFT HEART CATH AND CORONARY ANGIOGRAPHY N/A 07/02/2021   Procedure: LEFT HEART CATH AND CORONARY ANGIOGRAPHY;  Surgeon: Jettie Booze, MD;  Location: [place] [facility];  Service: Cardiovascular;  Laterality: N/A;   POLYPECTOMY  05/24/2018   Procedure: POLYPECTOMY;  Surgeon: Danie Binder, MD;  [facility];  Service: Endoscopy;;  ascending and hepatic flexure, transverse   TEE WITHOUT CARDIOVERSION N/A 08/27/2021   Procedure: TRANSESOPHAGEAL ECHOCARDIOGRAM (TEE);  Surgeon: Pixie Casino, MD;  Location: [place];  Service:  Cardiovascular;  Laterality: N/A;   TUBAL LIGATION       OB History     Gravida  5   Para  5   Term  5   Preterm      AB      Living  4      SAB      IAB      Ectopic      Multiple      Live Births              Family History  Problem Relation Age of Onset   Heart failure Mother    Hypertension Mother    Heart failure Sister    Hypertension Father    Cancer Sister    Dementia Sister    Colon cancer Neg Hx    Colon polyps Neg Hx     Social History   Tobacco Use   Smoking status: Never   Smokeless tobacco: Never   Vaping Use   Vaping Use: Never used  Substance Use Topics   Alcohol use: No    Alcohol/week: 0.0 standard drinks   Drug use: No    Home Medications Prior to Admission medications   Medication Sig Start Date End Date Taking? Authorizing Provider  acetaminophen (TYLENOL) 500 MG tablet Take 1,000 mg by mouth every 6 (six) hours as needed.   Yes [provider]  atorvastatin (LIPITOR) 40 MG tablet TAKE (1) TABLET BY MOUTH AT BEDTIME. Patient taking differently: Take 40 mg by mouth at bedtime. 05/28/20  Yes Corum, Rex Kras, MD  ELIQUIS 5 MG TABS tablet TAKE 1 TABLET BY MOUTH TWICE A DAY. 05/05/20  Yes Corum, Rex Kras, MD  furosemide (LASIX) 40 MG tablet Take 1 tablet (40 mg total) by mouth daily. 08/19/21  Yes Evans Lance, MD  hydrALAZINE (APRESOLINE) 50 MG tablet Take 1 tablet (50 mg total) by mouth 2 (two) times daily. Patient taking differently: Take 50 mg by mouth 3 (three) times daily. 07/11/21 10/30/21 Yes Elodia Florence., MD  LANTUS SOLOSTAR 100 UNIT/ML Solostar Pen ADMINISTER 30 UNITS UNDER THE SKIN AT BEDTIME Patient taking differently: Inject 30 Units into the skin at bedtime. 08/06/21  Yes Nida, Marella Chimes, MD  losartan (COZAAR) 100 MG tablet TAKE (1) TABLET BY MOUTH ONCE DAILY. Patient taking differently: Take 100 mg by mouth daily. 05/28/20  Yes Corum, Rex Kras, MD  metFORMIN (GLUCOPHAGE) 1000 MG tablet TAKE (1) TABLET BY MOUTH TWICE DAILY. 05/17/21  Yes Nida, Marella Chimes, MD  metoprolol succinate (TOPROL-XL) 50 MG 24 hr tablet Take 1 tablet (50 mg total) by mouth 2 (two) times daily. Take with or immediately following a meal. 09/07/21  Yes Evans Lance, MD  NIFEdipine (PROCARDIA-XL/NIFEDICAL-XL) 30 MG 24 hr tablet Take 30 mg by mouth daily. 10/26/21  Yes [provider]  potassium chloride (KLOR-CON) 10 MEQ tablet Take 1 tablet (10 mEq total) by mouth daily. 08/19/21  Yes Evans Lance, MD  cefadroxil (DURICEF) 500 MG capsule Take 1 capsule (500 mg total)  by mouth 2 (two) times daily. Patient not taking: No sig reported 08/28/21   Cristal Deer, MD  glucose blood test strip Use to check blood glucose fasting , before lunch and dinner and after your largest meal 11/04/19   Corum, Rex Kras, MD  guaiFENesin (MUCINEX) 600 MG 12 hr tablet Take 1 tablet (600 mg total) by mouth 2 (two) times daily as needed for cough. Patient not taking: No sig  reported 08/28/21   Cristal Deer, MD  Lancets [facility] ULTRASOFT) lancets Use to check glucose 4 x daily 11/04/19   Corum, Rex Kras, MD  nitroGLYCERIN (NITROSTAT) 0.4 MG SL tablet Place 1 tablet (0.4 mg total) under the tongue every 5 (five) minutes as needed for chest pain. Patient not taking: Reported on 10/30/2021 02/24/21   Richardson Dopp T, PA-C  polyethylene glycol (MIRALAX / GLYCOLAX) 17 g packet Take 17 g by mouth daily as needed for mild constipation. Patient not taking: No sig reported 08/28/21   Cristal Deer, MD    Allergies    Penicillins  Review of Systems   Review of Systems  Gastrointestinal:  Positive for nausea and vomiting.  All other systems reviewed and are negative.  Physical Exam Updated Vital Signs BP (!) 181/66 (BP Location: Right Arm)   Pulse 73   Temp 98.3 F (36.8 C) (Oral)   Resp 18   Ht 5\' 5"  (1.651 m)   Wt 90.9 kg   SpO2 99%   BMI 33.35 kg/m   Physical Exam Vitals and nursing note reviewed.  Constitutional:      Appearance: Normal appearance.  HENT:     Head: Normocephalic and atraumatic.     Right Ear: External ear normal.     Left Ear: External ear normal.     Nose: Nose normal.     Mouth/Throat:     Mouth: Mucous membranes are dry.  Eyes:     Extraocular Movements: Extraocular movements intact.     Conjunctiva/sclera: Conjunctivae normal.     Pupils: Pupils are equal, round, and reactive to light.  Cardiovascular:     Rate and Rhythm: Normal rate and regular rhythm.     Pulses: Normal pulses.     Heart sounds: Normal heart sounds.  Pulmonary:      Effort: Pulmonary effort is normal.     Breath sounds: Normal breath sounds.  Abdominal:     General: Abdomen is flat. Bowel sounds are normal.     Palpations: Abdomen is soft.  Musculoskeletal:        General: Normal range of motion.     Cervical back: Normal range of motion and neck supple.  Skin:    General: Skin is warm.     Capillary Refill: Capillary refill takes less than 2 seconds.  Neurological:     General: No focal deficit present.     Mental Status: She is alert and oriented to person, place, and time.  Psychiatric:        Mood and Affect: Mood normal.        Behavior: Behavior normal.    ED Results / Procedures / Treatments   Labs (all labs ordered are listed, but only abnormal results are displayed) Labs Reviewed  COMPREHENSIVE METABOLIC PANEL - Abnormal; Notable for the following components:      Result Value   Glucose, Bld 183 (*)    All other components within normal limits  CBC - Abnormal; Notable for the following components:   RDW 15.8 (*)    All other components within normal limits  URINALYSIS, ROUTINE W REFLEX MICROSCOPIC - Abnormal; Notable for the following components:   Hgb urine dipstick SMALL (*)    All other components within normal limits  RESP PANEL BY RT-PCR (FLU A&B, COVID) ARPGX2  LIPASE, BLOOD    EKG None  Radiology CT ABDOMEN PELVIS W CONTRAST  Result Date: 10/30/2021 CLINICAL DATA:  Acute abdominal pain EXAM: CT ABDOMEN AND PELVIS  WITH CONTRAST TECHNIQUE: Multidetector CT imaging of the abdomen and pelvis was performed using the standard protocol following bolus administration of intravenous contrast. CONTRAST:  165mL OMNIPAQUE IOHEXOL 300 MG/ML  SOLN COMPARISON:  08/22/2021 FINDINGS: Lower chest: No acute abnormality. Hepatobiliary: Scattered hypodensities are noted throughout the liver similar to that seen on prior exam consistent with cysts. The previously seen cholecystostomy tube has been removed in the interval. The gallbladder is  partially distended with considerable wall thickening identified. Pericholecystic inflammatory changes are noted as well. These findings are highly suspicious for recurrent acute cholecystitis. Ultrasound may be helpful for further evaluation. Pancreas: Unremarkable. No pancreatic ductal dilatation or surrounding inflammatory changes. Spleen: Normal in size without focal abnormality. Adrenals/Urinary Tract: Adrenal glands are within normal limits. Kidneys demonstrate a normal enhancement pattern bilaterally. Normal excretion of contrast is seen bilaterally. No renal calculi or obstructive changes are seen. The bladder is decompressed. Stomach/Bowel: The appendix is well visualized and within normal limits. No obstructive or inflammatory changes of the colon are seen. Small bowel is unremarkable. The stomach is within normal limits. Vascular/Lymphatic: Aortic atherosclerosis. No enlarged abdominal or pelvic lymph nodes. Reproductive: Uterus and bilateral adnexa are unremarkable. Other: No abdominal wall hernia or abnormality. No abdominopelvic ascites. Musculoskeletal: No acute or significant osseous findings. IMPRESSION: Wall thickening and pericholecystic inflammatory changes fluid suggestive of recurrent acute cholecystitis. Ultrasound may be helpful for further evaluation. Stable hepatic cysts. No other acute abnormality is noted. Electronically Signed   By: Inez Catalina M.D.   On: 10/30/2021 20:58    Procedures Procedures   Medications Ordered in ED Medications  ceFEPIme (MAXIPIME) 1 g in sodium chloride 0.9 % 100 mL IVPB (has no administration in time range)    And  metroNIDAZOLE (FLAGYL) IVPB 500 mg (has no administration in time range)  sodium chloride 0.9 % bolus 1,000 mL (1,000 mLs Intravenous New Bag/Given 10/30/21 2019)  ondansetron (ZOFRAN) injection 4 mg (4 mg Intravenous Given 10/30/21 2020)  iohexol (OMNIPAQUE) 300 MG/ML solution 100 mL (100 mLs Intravenous Contrast Given 10/30/21 2030)     ED Course  I have reviewed the triage vital signs and the nursing notes.  Pertinent labs & imaging results that were available during my care of the patient were reviewed by me and considered in my medical decision making (see chart for details).    MDM Rules/Calculators/A&P                           CT shows evidence of acute cholecystitis.  Pt given cefepime and flagyl as she has a pcn allergy.  Pt d/w Dr. Arnoldo Morale.  He recommends IV abx and admission to medicine.  He will see pt in consult tomorrow. Final Clinical Impression(s) / ED Diagnoses Final diagnoses:  Acute cholecystitis  Nausea and vomiting, unspecified vomiting type    Rx / DC Orders ED Discharge Orders     None        Isla Pence, MD 10/30/21 2134

## 2021-10-30 NOTE — H&P (Signed)
History and Physical  Natalie Castro:[id number] DOB: 23-Feb-1941 DOA: 10/30/2021  Referring physician: Dr Gilford Raid, ED physician PCP: [name], MD  Outpatient Specialists:   Patient Coming From: home  Chief Complaint: Abdominal pain  HPI: Natalie Castro is a 80 y.o. 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  Patient has a recent admission for sepsis secondary to cholecystitis and E. coli bacteremia in July 2022.  She had a drain placed in the gallbladder lumen by interventional radiology, which successfully treated the patient's acute cholecystitis without surgery.  The Coley tube has since been removed.  The patient presents today with increasing abdominal pain in the right upper quadrant that started around 1 PM.  Her pain has been slightly worsening.  Denies fevers, chills, nausea, vomiting.  No palliating or provoking factors.  Emergency Department Course: Patient afebrile.  White count normal.  CT scan shows gallbladder wall thickening with pericholecystic inflammatory changes.  Patient was started on antibiotics and general surgery was consulted.  General surgery will see the patient tomorrow  Review of Systems:   Pt denies any fevers, chills, nausea, vomiting, diarrhea, constipation, shortness of breath, dyspnea on exertion, orthopnea, cough, wheezing, palpitations, headache, vision changes, lightheadedness, dizziness, melena, rectal bleeding.  Review of systems are otherwise negative  Past Medical History:  Diagnosis Date   Acid reflux    Arthritis    Colon adenomas    AGE 68   Complete heart block (HCC)    STJ PPM Dr. 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, long term anticoag with apixaban    Hyperlipidemia    Myoview 03/2021    Myoview 4/22: EF 50, no infarct or ischemia;  low risk   Presence of permanent cardiac pacemaker    Type 2 diabetes mellitus ([place])    Past Surgical History:  Procedure Laterality Date   COLONOSCOPY     3 SIMPLE ADENOMAS, AGE 82   COLONOSCOPY N/A 05/24/2018   Procedure: COLONOSCOPY;  Surgeon: Danie Binder, MD;  [facility];  Service: Endoscopy;  Laterality: N/A;  1:00pm   EP IMPLANTABLE DEVICE N/A 11/24/2015   Procedure: Pacemaker Implant;  Surgeon: [name], MD;  Location: [facility];  Service: Cardiovascular;  Laterality: N/A;   IR EXCHANGE BILIARY DRAIN  07/27/2021   IR EXCHANGE BILIARY DRAIN  08/23/2021   IR PERC CHOLECYSTOSTOMY  07/08/2021   IR RADIOLOGIST EVAL & MGMT  09/22/2021   JOINT REPLACEMENT     knees bilat.   LEFT HEART CATH AND CORONARY ANGIOGRAPHY N/A 07/02/2021   Procedure: LEFT HEART CATH AND CORONARY ANGIOGRAPHY;  Surgeon: Jettie Booze, MD;  Location: [facility];  Service: Cardiovascular;  Laterality: N/A;   POLYPECTOMY  05/24/2018   Procedure: POLYPECTOMY;  Surgeon: Danie Binder, MD;  Location: [facility];  Service: Endoscopy;;  ascending and hepatic flexure, transverse   TEE WITHOUT CARDIOVERSION N/A 08/27/2021   Procedure: TRANSESOPHAGEAL ECHOCARDIOGRAM (TEE);  Surgeon: Pixie Casino, MD;  Location: [facility];  Service: Cardiovascular;  Laterality: N/A;   TUBAL LIGATION     Social History:  reports that she has never smoked. She has never used smokeless tobacco. She reports that she does not drink alcohol and does not use drugs. Patient lives at home  Allergies  Allergen Reactions   Penicillins  Hives, Itching and Other (See Comments)    Has tolerated ceftriaxone 7/22 Has patient had a PCN reaction causing immediate rash, facial/tongue/throat swelling, SOB or lightheadedness with hypotension: No Has patient had a PCN reaction causing severe rash involving mucus membranes or skin necrosis: Yes Has patient had a PCN reaction that required hospitalization No Has  patient had a PCN reaction occurring within the last 10 years: No If all of the above answers are "NO", then may proceed with Cephalosporin use.     Family History  Problem Relation Age of Onset   Heart failure Mother    Hypertension Mother    Heart failure Sister    Hypertension Father    Cancer Sister    Dementia Sister    Colon cancer Neg Hx    Colon polyps Neg Hx       Prior to Admission medications   Medication Sig Start Date End Date Taking? Authorizing Provider  acetaminophen (TYLENOL) 500 MG tablet Take 1,000 mg by mouth every 6 (six) hours as needed.   Yes [provider]  atorvastatin (LIPITOR) 40 MG tablet TAKE (1) TABLET BY MOUTH AT BEDTIME. Patient taking differently: Take 40 mg by mouth at bedtime. 05/28/20  Yes Corum, Rex Kras, MD  ELIQUIS 5 MG TABS tablet TAKE 1 TABLET BY MOUTH TWICE A DAY. 05/05/20  Yes Corum, Rex Kras, MD  furosemide (LASIX) 40 MG tablet Take 1 tablet (40 mg total) by mouth daily. 08/19/21  Yes Evans Lance, MD  hydrALAZINE (APRESOLINE) 50 MG tablet Take 1 tablet (50 mg total) by mouth 2 (two) times daily. Patient taking differently: Take 50 mg by mouth 3 (three) times daily. 07/11/21 10/30/21 Yes Elodia Florence., MD  LANTUS SOLOSTAR 100 UNIT/ML Solostar Pen ADMINISTER 30 UNITS UNDER THE SKIN AT BEDTIME Patient taking differently: Inject 30 Units into the skin at bedtime. 08/06/21  Yes Nida, Marella Chimes, MD  losartan (COZAAR) 100 MG tablet TAKE (1) TABLET BY MOUTH ONCE DAILY. Patient taking differently: Take 100 mg by mouth daily. 05/28/20  Yes Corum, Rex Kras, MD  metFORMIN (GLUCOPHAGE) 1000 MG tablet TAKE (1) TABLET BY MOUTH TWICE DAILY. 05/17/21  Yes Nida, Marella Chimes, MD  metoprolol succinate (TOPROL-XL) 50 MG 24 hr tablet Take 1 tablet (50 mg total) by mouth 2 (two) times daily. Take with or immediately following a meal. 09/07/21  Yes Evans Lance, MD  NIFEdipine (PROCARDIA-XL/NIFEDICAL-XL) 30 MG 24 hr tablet Take 30 mg by mouth  daily. 10/26/21  Yes [provider]  potassium chloride (KLOR-CON) 10 MEQ tablet Take 1 tablet (10 mEq total) by mouth daily. 08/19/21  Yes Evans Lance, MD  cefadroxil (DURICEF) 500 MG capsule Take 1 capsule (500 mg total) by mouth 2 (two) times daily. Patient not taking: No sig reported 08/28/21   Cristal Deer, MD  glucose blood test strip Use to check blood glucose fasting , before lunch and dinner and after your largest meal 11/04/19   Corum, Rex Kras, MD  guaiFENesin (MUCINEX) 600 MG 12 hr tablet Take 1 tablet (600 mg total) by mouth 2 (two) times daily as needed for cough. Patient not taking: No sig reported 08/28/21   Cristal Deer, MD  Lancets [facility] ULTRASOFT) lancets Use to check glucose 4 x daily 11/04/19   Corum, Rex Kras, MD  nitroGLYCERIN (NITROSTAT) 0.4 MG SL tablet Place 1 tablet (0.4 mg total) under the tongue every 5 (five) minutes as needed for chest pain. Patient not taking: Reported on  10/30/2021 02/24/21   Richardson Dopp T, PA-C  polyethylene glycol (MIRALAX / GLYCOLAX) 17 g packet Take 17 g by mouth daily as needed for mild constipation. Patient not taking: No sig reported 08/28/21   Cristal Deer, MD    Physical Exam: BP (!) 181/66 (BP Location: Right Arm)   Pulse 73   Temp 98.3 F (36.8 C) (Oral)   Resp 18   Ht 5\' 5"  (1.651 m)   Wt 90.9 kg   SpO2 99%   BMI 33.35 kg/m   General: Elderly female. Awake and alert and oriented x3. No acute cardiopulmonary distress.  HEENT: Normocephalic atraumatic.  Right and left ears normal in appearance.  Pupils equal, round, reactive to light. Extraocular muscles are intact. Sclerae anicteric and noninjected.  Moist mucosal membranes. No mucosal lesions.  Neck: Neck supple without lymphadenopathy. No carotid bruits. No masses palpated.  Cardiovascular: Regular rate with normal S1-S2 sounds. No murmurs, rubs, gallops auscultated. No JVD.  Respiratory: Good respiratory effort with no wheezes, rales, rhonchi. Lungs clear to  auscultation bilaterally.  No accessory muscle use. Abdomen: Soft, right upper for quadrant tenderness without guarding or rebound.  Abdomen is nondistended. Active bowel sounds. No masses or hepatosplenomegaly  Skin: No rashes, lesions, or ulcerations.  Dry, warm to touch. 2+ dorsalis pedis and radial pulses. Musculoskeletal: No calf or leg pain. All major joints not erythematous nontender.  No upper or lower joint deformation.  Good ROM.  No contractures  Psychiatric: Intact judgment and insight. Pleasant and cooperative. Neurologic: No focal neurological deficits. Strength is 5/5 and symmetric in upper and lower extremities.  Cranial nerves II through XII are grossly intact.           Labs on Admission: I have personally reviewed following labs and imaging studies  CBC: Recent Labs  Lab 10/30/21 1902  WBC 8.8  HGB 12.6  HCT 39.4  MCV 98.5  PLT 341   Basic Metabolic Panel: Recent Labs  Lab 10/30/21 1902  NA 141  K 3.6  CL 105  CO2 24  GLUCOSE 183*  BUN 16  CREATININE 0.83  CALCIUM 9.4   GFR: Estimated Creatinine Clearance: 61.3 mL/min (by C-G formula based on SCr of 0.83 mg/dL). Liver Function Tests: Recent Labs  Lab 10/30/21 1902  AST 16  ALT 10  ALKPHOS 62  BILITOT 0.6  PROT 8.1  ALBUMIN 3.5   Recent Labs  Lab 10/30/21 1902  LIPASE 34   No results for input(s): AMMONIA in the last 168 hours. Coagulation Profile: No results for input(s): INR, PROTIME in the last 168 hours. Cardiac Enzymes: No results for input(s): CKTOTAL, CKMB, CKMBINDEX, TROPONINI in the last 168 hours. BNP (last 3 results) No results for input(s): PROBNP in the last 8760 hours. HbA1C: No results for input(s): HGBA1C in the last 72 hours. CBG: No results for input(s): GLUCAP in the last 168 hours. Lipid Profile: No results for input(s): CHOL, HDL, LDLCALC, TRIG, CHOLHDL, LDLDIRECT in the last 72 hours. Thyroid Function Tests: No results for input(s): TSH, T4TOTAL, FREET4,  T3FREE, THYROIDAB in the last 72 hours. Anemia Panel: No results for input(s): VITAMINB12, FOLATE, FERRITIN, TIBC, IRON, RETICCTPCT in the last 72 hours. Urine analysis:    Component Value Date/Time   COLORURINE YELLOW 10/30/2021 1954   APPEARANCEUR CLEAR 10/30/2021 1954   LABSPEC 1.011 10/30/2021 1954   PHURINE 5.0 10/30/2021 1954   GLUCOSEU NEGATIVE 10/30/2021 1954   HGBUR SMALL (A) 10/30/2021 [place] NEGATIVE 10/30/2021 1954  KETONESUR NEGATIVE 10/30/2021 1954   PROTEINUR NEGATIVE 10/30/2021 1954   UROBILINOGEN 0.2 06/18/2015 2300   NITRITE NEGATIVE 10/30/2021 1954   LEUKOCYTESUR NEGATIVE 10/30/2021 1954   Sepsis Labs: @LABRCNTIP (procalcitonin:4,lacticidven:4) )No results found for this or any previous visit (from the past 240 hour(s)).   Radiological Exams on Admission: CT ABDOMEN PELVIS W CONTRAST  Result Date: 10/30/2021 CLINICAL DATA:  Acute abdominal pain EXAM: CT ABDOMEN AND PELVIS WITH CONTRAST TECHNIQUE: Multidetector CT imaging of the abdomen and pelvis was performed using the standard protocol following bolus administration of intravenous contrast. CONTRAST:  171mL OMNIPAQUE IOHEXOL 300 MG/ML  SOLN COMPARISON:  08/22/2021 FINDINGS: Lower chest: No acute abnormality. Hepatobiliary: Scattered hypodensities are noted throughout the liver similar to that seen on prior exam consistent with cysts. The previously seen cholecystostomy tube has been removed in the interval. The gallbladder is partially distended with considerable wall thickening identified. Pericholecystic inflammatory changes are noted as well. These findings are highly suspicious for recurrent acute cholecystitis. Ultrasound may be helpful for further evaluation. Pancreas: Unremarkable. No pancreatic ductal dilatation or surrounding inflammatory changes. Spleen: Normal in size without focal abnormality. Adrenals/Urinary Tract: Adrenal glands are within normal limits. Kidneys demonstrate a normal  enhancement pattern bilaterally. Normal excretion of contrast is seen bilaterally. No renal calculi or obstructive changes are seen. The bladder is decompressed. Stomach/Bowel: The appendix is well visualized and within normal limits. No obstructive or inflammatory changes of the colon are seen. Small bowel is unremarkable. The stomach is within normal limits. Vascular/Lymphatic: Aortic atherosclerosis. No enlarged abdominal or pelvic lymph nodes. Reproductive: Uterus and bilateral adnexa are unremarkable. Other: No abdominal wall hernia or abnormality. No abdominopelvic ascites. Musculoskeletal: No acute or significant osseous findings. IMPRESSION: Wall thickening and pericholecystic inflammatory changes fluid suggestive of recurrent acute cholecystitis. Ultrasound may be helpful for further evaluation. Stable hepatic cysts. No other acute abnormality is noted. Electronically Signed   By: Inez Catalina M.D.   On: 10/30/2021 20:58    Assessment/Plan: Principal Problem:   Acute cholecystitis without calculus Active Problems:   Essential hypertension, benign   Complete heart block (HCC)/Saint Jude pacemaker   History of pulmonary embolus (PE)   Mixed hyperlipidemia   Uncontrolled type 2 diabetes mellitus with hyperglycemia (HCC)   Chronic diastolic CHF (congestive heart failure) ([place])    This patient was discussed with the ED physician, including pertinent vitals, physical exam findings, labs, and imaging.  We also discussed care given by the ED provider.  Acute cholecystitis without calculus Admit Continue antibiotics Repeat CBC and CMP in the morning General surgery to see Will hold anticoagulation Keep patient on clear liquids Type 2 diabetes Continue insulin Sliding scale insulin CBGs before meals and nightly Hold metformin HFpEF with complete heart block and Saint Jude pacemaker Continue Lasix Continue antihypertensives History of PE Will hold anticoagulation -we will restart as  soon as possible Started SCDs Hypertension Continue antihypertensives  DVT prophylaxis: SCDs Consultants: General surgery Code Status: Full code Family Communication:   Disposition Plan: Patient should be able to return home following admission   Truett Mainland, DO

## 2021-10-30 NOTE — ED Triage Notes (Signed)
Pt to the ED with nausea and vomiting that began this afternoon at 1300.

## 2021-10-31 ENCOUNTER — Inpatient Hospital Stay (HOSPITAL_COMMUNITY): Payer: Medicare Other

## 2021-10-31 DIAGNOSIS — R1011 Right upper quadrant pain: Secondary | ICD-10-CM

## 2021-10-31 DIAGNOSIS — K810 Acute cholecystitis: Secondary | ICD-10-CM | POA: Diagnosis not present

## 2021-10-31 LAB — CBC
HCT: 33.8 % — ABNORMAL LOW (ref 36.0–46.0)
Hemoglobin: 10.8 g/dL — ABNORMAL LOW (ref 12.0–15.0)
MCH: 31.1 pg (ref 26.0–34.0)
MCHC: 32 g/dL (ref 30.0–36.0)
MCV: 97.4 fL (ref 80.0–100.0)
Platelets: 170 10*3/uL (ref 150–400)
RBC: 3.47 MIL/uL — ABNORMAL LOW (ref 3.87–5.11)
RDW: 15.9 % — ABNORMAL HIGH (ref 11.5–15.5)
WBC: 10.9 10*3/uL — ABNORMAL HIGH (ref 4.0–10.5)
nRBC: 0 % (ref 0.0–0.2)

## 2021-10-31 LAB — COMPREHENSIVE METABOLIC PANEL
ALT: 8 U/L (ref 0–44)
AST: 12 U/L — ABNORMAL LOW (ref 15–41)
Albumin: 2.7 g/dL — ABNORMAL LOW (ref 3.5–5.0)
Alkaline Phosphatase: 47 U/L (ref 38–126)
Anion gap: 8 (ref 5–15)
BUN: 13 mg/dL (ref 8–23)
CO2: 26 mmol/L (ref 22–32)
Calcium: 8.4 mg/dL — ABNORMAL LOW (ref 8.9–10.3)
Chloride: 106 mmol/L (ref 98–111)
Creatinine, Ser: 0.78 mg/dL (ref 0.44–1.00)
GFR, Estimated: >60 mL/min (ref >60)
Glucose, Bld: 121 mg/dL — ABNORMAL HIGH (ref 70–99)
Potassium: 3.4 mmol/L — ABNORMAL LOW (ref 3.5–5.1)
Sodium: 140 mmol/L (ref 135–145)
Total Bilirubin: 0.3 mg/dL (ref 0.3–1.2)
Total Protein: 6.4 g/dL — ABNORMAL LOW (ref 6.5–8.1)

## 2021-10-31 LAB — GLUCOSE, CAPILLARY
Glucose-Capillary: 126 mg/dL — ABNORMAL HIGH (ref 70–99)
Glucose-Capillary: 164 mg/dL — ABNORMAL HIGH (ref 70–99)
Glucose-Capillary: 123 mg/dL — ABNORMAL HIGH (ref 70–99)
Glucose-Capillary: 122 mg/dL — ABNORMAL HIGH (ref 70–99)

## 2021-10-31 LAB — HEMOGLOBIN A1C
Hgb A1c MFr Bld: 6.8 % — ABNORMAL HIGH (ref 4.8–5.6)
Mean Plasma Glucose: 148.46 mg/dL

## 2021-10-31 NOTE — Progress Notes (Signed)
PROGRESS NOTE  Natalie Castro NKN:[id number] DOB: [birth year] DOA: 10/30/2021 PCP: [name], MD  HPI/Recap of past 24 hours:  Natalie Castro is a 80 y.o. 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  Patient has a recent admission for sepsis secondary to cholecystitis and E. coli bacteremia in July 2022.  She had a drain placed in the gallbladder lumen by interventional radiology, which successfully treated the patient's acute cholecystitis without surgery.  The Coley tube has since been removed.  The patient presents today with increasing abdominal pain in the right upper quadrant that started around 1 PM.  Her pain has been slightly worsening.  Denies fevers, chills, nausea, vomiting.  No palliating or provoking factors.  10/31/21:  Patient was seen and examined at her bedside.  She denies having any abdominal pain at the time of this exam.  Denies having nausea.  States that she is hungry.    Assessment/Plan: Principal Problem:   Acute cholecystitis without calculus Active Problems:   Essential hypertension, benign   Complete heart block (HCC)/Saint Jude pacemaker   History of pulmonary embolus (PE)   Mixed hyperlipidemia   Uncontrolled type 2 diabetes mellitus with hyperglycemia (HCC)   Chronic diastolic CHF (congestive heart failure) (HCC)   Abdominal pain, right upper quadrant  Acute cholecystitis without cholelithiasis Continue IV antibiotics and IV fluid Seen by general surgery, following. On clear liquid diet OAC on hold Type 2 diabetes HgA1C 6.8% Continue ISS Continue to hold off home oral hypoglycemics HFpEF with complete heart block and Saint Jude pacemaker Continue Lasix Continue home cardiac medications History of PE Restart [place] as soon as possible Hypertension              BP is at goal Continue home oral  antihypertensives.   DVT prophylaxis: SCDs Consultants: General surgery Code Status: Full code Family Communication:   Disposition Plan: Patient should be able to return home following admission        Objective: Vitals:   10/31/21 0151 10/31/21 0326 10/31/21 0521 10/31/21 1242  BP: (!) 140/53  (!) 111/44 (!) 128/51  Pulse: 77  73 64  Resp: 18  18 18   Temp: 99.7 F (37.6 C) 99.3 F (37.4 C) 99.7 F (37.6 C) 98.7 F (37.1 C)  TempSrc: Oral Oral  Oral  SpO2: 95%  99% 99%  Weight:      Height:        Intake/Output Summary (Last 24 hours) at 10/31/2021 1444 Last data filed at 10/31/2021 1300 Gross per 24 hour  Intake 1787.62 ml  Output --  Net 1787.62 ml   Filed Weights   10/30/21 1827  Weight: 90.9 kg    Exam:  General: 80 y.o. year-old female well developed well nourished in no acute distress.  Alert and oriented x3. Cardiovascular: Regular rate and rhythm with no rubs or gallops.  No thyromegaly or JVD noted.   Respiratory: Clear to auscultation with no wheezes or rales. Good inspiratory effort. Abdomen: Soft nontender nondistended with normal bowel sounds x4 quadrants. Musculoskeletal: No lower extremity edema. 2/4 pulses in all 4 extremities. Skin: No ulcerative lesions noted or rashes, Psychiatry: Mood is appropriate for condition and setting   Data Reviewed: CBC: Recent Labs  Lab 10/30/21 1902 10/31/21 0349  WBC 8.8 10.9*  HGB 12.6 10.8*  HCT 39.4 33.8*  MCV 98.5 97.4  PLT 181 170  Basic Metabolic Panel: Recent Labs  Lab 10/30/21 1902 10/31/21 0349  NA 141 140  K 3.6 3.4*  CL 105 106  CO2 24 26  GLUCOSE 183* 121*  BUN 16 13  CREATININE 0.83 0.78  CALCIUM 9.4 8.4*   GFR: Estimated Creatinine Clearance: 63.6 mL/min (by C-G formula based on SCr of 0.78 mg/dL). Liver Function Tests: Recent Labs  Lab 10/30/21 1902 10/31/21 0349  AST 16 12*  ALT 10 8  ALKPHOS 62 47  BILITOT 0.6 0.3  PROT 8.1 6.4*  ALBUMIN 3.5 2.7*   Recent Labs   Lab 10/30/21 1902  LIPASE 34   No results for input(s): AMMONIA in the last 168 hours. Coagulation Profile: No results for input(s): INR, PROTIME in the last 168 hours. Cardiac Enzymes: No results for input(s): CKTOTAL, CKMB, CKMBINDEX, TROPONINI in the last 168 hours. BNP (last 3 results) No results for input(s): PROBNP in the last 8760 hours. HbA1C: Recent Labs    10/30/21 1902  HGBA1C 6.8*   CBG: Recent Labs  Lab 10/30/21 2304 10/31/21 0733 10/31/21 1117  GLUCAP 131* 126* 164*   Lipid Profile: No results for input(s): CHOL, HDL, LDLCALC, TRIG, CHOLHDL, LDLDIRECT in the last 72 hours. Thyroid Function Tests: No results for input(s): TSH, T4TOTAL, FREET4, T3FREE, THYROIDAB in the last 72 hours. Anemia Panel: No results for input(s): VITAMINB12, FOLATE, FERRITIN, TIBC, IRON, RETICCTPCT in the last 72 hours. Urine analysis:    Component Value Date/Time   COLORURINE YELLOW 10/30/2021 1954   APPEARANCEUR CLEAR 10/30/2021 1954   LABSPEC 1.011 10/30/2021 1954   PHURINE 5.0 10/30/2021 1954   GLUCOSEU NEGATIVE 10/30/2021 1954   HGBUR SMALL (A) 10/30/2021 [place] NEGATIVE 10/30/2021 1954   KETONESUR NEGATIVE 10/30/2021 1954   PROTEINUR NEGATIVE 10/30/2021 1954   UROBILINOGEN 0.2 06/18/2015 2300   NITRITE NEGATIVE 10/30/2021 1954   LEUKOCYTESUR NEGATIVE 10/30/2021 1954   Sepsis Labs: @LABRCNTIP (procalcitonin:4,lacticidven:4)  )No results found for this or any previous visit (from the past 240 hour(s)).    Studies: CT ABDOMEN PELVIS W CONTRAST  Result Date: 10/30/2021 CLINICAL DATA:  Acute abdominal pain EXAM: CT ABDOMEN AND PELVIS WITH CONTRAST TECHNIQUE: Multidetector CT imaging of the abdomen and pelvis was performed using the standard protocol following bolus administration of intravenous contrast. CONTRAST:  172mL OMNIPAQUE IOHEXOL 300 MG/ML  SOLN COMPARISON:  08/22/2021 FINDINGS: Lower chest: No acute abnormality. Hepatobiliary: Scattered hypodensities  are noted throughout the liver similar to that seen on prior exam consistent with cysts. The previously seen cholecystostomy tube has been removed in the interval. The gallbladder is partially distended with considerable wall thickening identified. Pericholecystic inflammatory changes are noted as well. These findings are highly suspicious for recurrent acute cholecystitis. Ultrasound may be helpful for further evaluation. Pancreas: Unremarkable. No pancreatic ductal dilatation or surrounding inflammatory changes. Spleen: Normal in size without focal abnormality. Adrenals/Urinary Tract: Adrenal glands are within normal limits. Kidneys demonstrate a normal enhancement pattern bilaterally. Normal excretion of contrast is seen bilaterally. No renal calculi or obstructive changes are seen. The bladder is decompressed. Stomach/Bowel: The appendix is well visualized and within normal limits. No obstructive or inflammatory changes of the colon are seen. Small bowel is unremarkable. The stomach is within normal limits. Vascular/Lymphatic: Aortic atherosclerosis. No enlarged abdominal or pelvic lymph nodes. Reproductive: Uterus and bilateral adnexa are unremarkable. Other: No abdominal wall hernia or abnormality. No abdominopelvic ascites. Musculoskeletal: No acute or significant osseous findings. IMPRESSION: Wall thickening and pericholecystic inflammatory changes fluid suggestive of recurrent acute cholecystitis.  Ultrasound may be helpful for further evaluation. Stable hepatic cysts. No other acute abnormality is noted. Electronically Signed   By: Inez Catalina M.D.   On: 10/30/2021 20:58   US Abdomen Limited RUQ (LIVER/GB)  Result Date: 10/31/2021 CLINICAL DATA:  Suspected acute cholecystitis on CT scan from October 30, 2021 EXAM: ULTRASOUND ABDOMEN LIMITED RIGHT UPPER QUADRANT COMPARISON:  None. FINDINGS: Gallbladder: Gallbladder wall thickening to 6.1 mm is identified. There is sludge in the gallbladder. A small  amount of pericholecystic fluid is identified. No Murphy's sign reported. Common bile duct: Diameter: 5.7 mm Liver: 2.5 cm cyst in the right hepatic lobe. No other abnormalities. Portal vein is patent on color Doppler imaging with normal direction of blood flow towards the liver. Other: None. IMPRESSION: 1. Gallbladder wall thickening, pericholecystic fluid, and sludge. While a Murphy's sign was not reported, the findings on yesterday's CT scan are highly suggestive of acute cholecystitis. 2. 2.5 cm cyst in the right hepatic lobe. 3. No other abnormalities. Electronically Signed   By: Dorise Bullion III M.D.   On: 10/31/2021 11:49    Scheduled Meds:  atorvastatin  40 mg Oral QHS   furosemide  40 mg Oral Daily   hydrALAZINE  50 mg Oral TID   insulin aspart  0-15 Units Subcutaneous TID WC   insulin aspart  0-5 Units Subcutaneous QHS   insulin glargine-yfgn  30 Units Subcutaneous QHS   losartan  100 mg Oral Daily   metoprolol succinate  50 mg Oral BID   NIFEdipine  30 mg Oral Daily   potassium chloride  10 mEq Oral Daily    Continuous Infusions:  cefTRIAXone (ROCEPHIN)  IV 2 g (10/30/21 2206)   metronidazole 500 mg (10/31/21 0848)     LOS: 1 day     Kayleen Memos, MD [facility] Pager [phone]  If 7PM-7AM, please contact night-coverage [email or web address] Password [facility] 10/31/2021, 2:44 PM

## 2021-10-31 NOTE — Consult Note (Signed)
Reason for Consult: Right upper quadrant abdominal pain Referring Physician: Dr. Bufford Buttner is an 80 y.o. female.  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  The patient had multiple studies of her cholecystostomy tube and ultimately it was removed in September of this year.  On that final study, no stones were seen.  Patient had been evaluated by Dr. [name] of [facility] in September who felt the patient did not need an elective cholecystectomy.  Patient was asymptomatic.  Patient presented to the emergency room yesterday with an episode of right upper quadrant abdominal pain and nausea.  CT scan of the abdomen revealed possible cholecystitis.  Patient was admitted to the hospital for further evaluation and treatment.  She states that since her admission, she has had no right upper quadrant abdominal pain, nausea, or vomiting.  She has tolerated clear liquid diet.  Past Medical History:  Diagnosis Date   Acid reflux    Arthritis    Colon adenomas    AGE 62   Complete heart block (HCC)    STJ PPM Dr. 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, long term anticoag with apixaban    Hyperlipidemia    Myoview 03/2021    Myoview 4/22: EF 50, no infarct or ischemia; low risk   Presence of permanent cardiac pacemaker    Type 2 diabetes mellitus ([place])     Past Surgical History:  Procedure Laterality Date   COLONOSCOPY     3 SIMPLE ADENOMAS, AGE 37   COLONOSCOPY N/A 05/24/2018   Procedure: COLONOSCOPY;  Surgeon: Danie Binder, MD;  [facility];  Service: Endoscopy;  Laterality: N/A;  1:00pm   EP IMPLANTABLE DEVICE N/A 11/24/2015   Procedure: Pacemaker Implant;   Surgeon: [name], MD;  Location: [place] [facility];  Service: Cardiovascular;  Laterality: N/A;   IR EXCHANGE BILIARY DRAIN  07/27/2021   IR EXCHANGE BILIARY DRAIN  08/23/2021   IR PERC CHOLECYSTOSTOMY  07/08/2021   IR RADIOLOGIST EVAL & MGMT  09/22/2021   JOINT REPLACEMENT     knees bilat.   LEFT HEART CATH AND CORONARY ANGIOGRAPHY N/A 07/02/2021   Procedure: LEFT HEART CATH AND CORONARY ANGIOGRAPHY;  Surgeon: Jettie Booze, MD;  Location: [place] [facility];  Service: Cardiovascular;  Laterality: N/A;   POLYPECTOMY  05/24/2018   Procedure: POLYPECTOMY;  Surgeon: Danie Binder, MD;  [facility];  Service: Endoscopy;;  ascending and hepatic flexure, transverse   TEE WITHOUT CARDIOVERSION N/A 08/27/2021   Procedure: TRANSESOPHAGEAL ECHOCARDIOGRAM (TEE);  Surgeon: Pixie Casino, MD;  Location: [facility] ENDOSCOPY;  Service: Cardiovascular;  Laterality: N/A;   TUBAL LIGATION      Family History  Problem Relation Age of Onset   Heart failure Mother    Hypertension Mother    Heart failure Sister    Hypertension Father    Cancer Sister    Dementia Sister    Colon cancer Neg Hx    Colon polyps Neg Hx     Social History:  reports that she has never smoked. She has never used smokeless tobacco. She reports that she does not drink alcohol and does not use drugs.  Allergies:  Allergies  Allergen Reactions   Penicillins Hives, Itching and Other (See Comments)    Has tolerated ceftriaxone 7/22 Has patient had a PCN reaction causing immediate rash, facial/tongue/throat swelling, SOB or lightheadedness with hypotension: No Has patient had a PCN reaction causing severe rash involving mucus membranes or skin necrosis: Yes Has patient had a PCN reaction that required hospitalization No Has patient had a PCN reaction occurring within the last 10 years: No If all of the above answers are "NO", then may proceed with Cephalosporin use.     Medications: I have reviewed the patient's  current medications. Prior to Admission:  Medications Prior to Admission  Medication Sig Dispense Refill Last Dose   acetaminophen (TYLENOL) 500 MG tablet Take 1,000 mg by mouth every 6 (six) hours as needed.   Past Week   atorvastatin (LIPITOR) 40 MG tablet TAKE (1) TABLET BY MOUTH AT BEDTIME. (Patient taking differently: Take 40 mg by mouth at bedtime.) 30 tablet 0 10/29/2021   ELIQUIS 5 MG TABS tablet TAKE 1 TABLET BY MOUTH TWICE A DAY. 60 tablet 0 10/30/2021 at 0930   furosemide (LASIX) 40 MG tablet Take 1 tablet (40 mg total) by mouth daily. 90 tablet 0 10/30/2021   hydrALAZINE (APRESOLINE) 50 MG tablet Take 1 tablet (50 mg total) by mouth 2 (two) times daily. (Patient taking differently: Take 50 mg by mouth 3 (three) times daily.) 60 tablet 0 10/30/2021   LANTUS SOLOSTAR 100 UNIT/ML Solostar Pen ADMINISTER 30 UNITS UNDER THE SKIN AT BEDTIME (Patient taking differently: Inject 30 Units into the skin at bedtime.) 30 mL 1 10/29/2021   losartan (COZAAR) 100 MG tablet TAKE (1) TABLET BY MOUTH ONCE DAILY. (Patient taking differently: Take 100 mg by mouth daily.) 30 tablet 0 10/30/2021   metFORMIN (GLUCOPHAGE) 1000 MG tablet TAKE (1) TABLET BY MOUTH TWICE DAILY. 60 tablet 2 10/30/2021   metoprolol succinate (TOPROL-XL) 50 MG 24 hr tablet Take 1 tablet (50 mg total) by mouth 2 (two) times daily. Take with or immediately following a meal. 180 tablet 3 10/30/2021 at 0930   NIFEdipine (PROCARDIA-XL/NIFEDICAL-XL) 30 MG 24 hr tablet Take 30 mg by mouth daily.   10/30/2021   potassium chloride (KLOR-CON) 10 MEQ tablet Take 1 tablet (10 mEq total) by mouth daily. 90 tablet 0 10/30/2021   cefadroxil (DURICEF) 500 MG capsule Take 1 capsule (500 mg total) by mouth 2 (two) times daily. (Patient not taking: No sig reported) 12 capsule 0 Not Taking   glucose blood test strip Use to check blood glucose fasting , before lunch and dinner and after your largest meal 120 each 12    guaiFENesin (MUCINEX) 600 MG 12 hr tablet Take  1 tablet (600 mg total) by mouth 2 (two) times daily as needed for cough. (Patient not taking: No sig reported) 30 tablet 0 Not Taking   Lancets (ONETOUCH ULTRASOFT) lancets Use to check glucose 4 x daily 120 each 12    nitroGLYCERIN (NITROSTAT) 0.4 MG SL tablet Place 1 tablet (0.4 mg total) under the tongue every 5 (five) minutes as needed for chest pain. (Patient not taking: Reported on 10/30/2021) 25 tablet 3 Not Taking   polyethylene glycol (MIRALAX / GLYCOLAX) 17 g packet Take 17 g by mouth daily as needed for mild constipation. (Patient not taking: No sig reported) 14 each 0 Not Taking    Results for orders placed or performed during the hospital encounter of 10/30/21 (from the past 48 hour(s))  Lipase, blood  Status: None   Collection Time: 10/30/21  7:02 PM  Result Value Ref Range   Lipase 34 11 - 51 U/L    Comment: Performed at [facility], [address], [place], [place] [address]  Comprehensive metabolic panel     Status: Abnormal   Collection Time: 10/30/21  7:02 PM  Result Value Ref Range   Sodium 141 135 - 145 mmol/L   Potassium 3.6 3.5 - 5.1 mmol/L   Chloride 105 98 - 111 mmol/L   CO2 24 22 - 32 mmol/L   Glucose, Bld 183 (H) 70 - 99 mg/dL    Comment: Glucose reference range applies only to samples taken after fasting for at least 8 hours.   BUN 16 8 - 23 mg/dL   Creatinine, Ser 0.83 0.44 - 1.00 mg/dL   Calcium 9.4 8.9 - 10.3 mg/dL   Total Protein 8.1 6.5 - 8.1 g/dL   Albumin 3.5 3.5 - 5.0 g/dL   AST 16 15 - 41 U/L   ALT 10 0 - 44 U/L   Alkaline Phosphatase 62 38 - 126 U/L   Total Bilirubin 0.6 0.3 - 1.2 mg/dL   GFR, Estimated >60 >60 mL/min    Comment: (NOTE) Calculated using the CKD-EPI Creatinine Equation (2021)    Anion gap 12 5 - 15    Comment: Performed at [facility], [address] [place], [place] [address]  CBC     Status: Abnormal   Collection Time: 10/30/21  7:02 PM  Result Value Ref Range   WBC 8.8 4.0 - 10.5 K/uL   RBC 4.00 3.87 - 5.11 MIL/uL    Hemoglobin 12.6 12.0 - 15.0 g/dL   HCT 39.4 36.0 - 46.0 %   MCV 98.5 80.0 - 100.0 fL   MCH 31.5 26.0 - 34.0 pg   MCHC 32.0 30.0 - 36.0 g/dL   RDW 15.8 (H) 11.5 - 15.5 %   Platelets 181 150 - 400 K/uL   nRBC 0.0 0.0 - 0.2 %    Comment: Performed at [facility], [address] [place], [place] [address]  Urinalysis, Routine w reflex microscopic Urine, Clean Catch     Status: Abnormal   Collection Time: 10/30/21  7:54 PM  Result Value Ref Range   Color, Urine YELLOW YELLOW   APPearance CLEAR CLEAR   Specific Gravity, Urine 1.011 1.005 - 1.030   pH 5.0 5.0 - 8.0   Glucose, UA NEGATIVE NEGATIVE mg/dL   Hgb urine dipstick SMALL (A) NEGATIVE   Bilirubin Urine NEGATIVE NEGATIVE   Ketones, ur NEGATIVE NEGATIVE mg/dL   Protein, ur NEGATIVE NEGATIVE mg/dL   Nitrite NEGATIVE NEGATIVE   Leukocytes,Ua NEGATIVE NEGATIVE   WBC, UA 0-5 0 - 5 WBC/hpf   Bacteria, UA NONE SEEN NONE SEEN   Squamous Epithelial / LPF 0-5 0 - 5   Mucus PRESENT     Comment: Performed at [facility], [address], [place], [place] [address]  Glucose, capillary     Status: Abnormal   Collection Time: 10/30/21 11:04 PM  Result Value Ref Range   Glucose-Capillary 131 (H) 70 - 99 mg/dL    Comment: Glucose reference range applies only to samples taken after fasting for at least 8 hours.   Comment 1 Notify RN   Comprehensive metabolic panel     Status: Abnormal   Collection Time: 10/31/21  3:49 AM  Result Value Ref Range   Sodium 140 135 - 145 mmol/L   Potassium 3.4 (L) 3.5 -  5.1 mmol/L   Chloride 106 98 - 111 mmol/L   CO2 26 22 - 32 mmol/L   Glucose, Bld 121 (H) 70 - 99 mg/dL    Comment: Glucose reference range applies only to samples taken after fasting for at least 8 hours.   BUN 13 8 - 23 mg/dL   Creatinine, Ser 0.78 0.44 - 1.00 mg/dL   Calcium 8.4 (L) 8.9 - 10.3 mg/dL   Total Protein 6.4 (L) 6.5 - 8.1 g/dL   Albumin 2.7 (L) 3.5 - 5.0 g/dL   AST 12 (L) 15 - 41 U/L   ALT 8 0 - 44 U/L   Alkaline Phosphatase 47  38 - 126 U/L   Total Bilirubin 0.3 0.3 - 1.2 mg/dL   GFR, Estimated >60 >60 mL/min    Comment: (NOTE) Calculated using the CKD-EPI Creatinine Equation (2021)    Anion gap 8 5 - 15    Comment: Performed at [facility], [address] [place], [place] [address]  CBC     Status: Abnormal   Collection Time: 10/31/21  3:49 AM  Result Value Ref Range   WBC 10.9 (H) 4.0 - 10.5 K/uL   RBC 3.47 (L) 3.87 - 5.11 MIL/uL   Hemoglobin 10.8 (L) 12.0 - 15.0 g/dL   HCT 33.8 (L) 36.0 - 46.0 %   MCV 97.4 80.0 - 100.0 fL   MCH 31.1 26.0 - 34.0 pg   MCHC 32.0 30.0 - 36.0 g/dL   RDW 15.9 (H) 11.5 - 15.5 %   Platelets 170 150 - 400 K/uL   nRBC 0.0 0.0 - 0.2 %    Comment: Performed at [facility], [address], [place], [place] [address]  Glucose, capillary     Status: Abnormal   Collection Time: 10/31/21  7:33 AM  Result Value Ref Range   Glucose-Capillary 126 (H) 70 - 99 mg/dL    Comment: Glucose reference range applies only to samples taken after fasting for at least 8 hours.    CT ABDOMEN PELVIS W CONTRAST  Result Date: 10/30/2021 CLINICAL DATA:  Acute abdominal pain EXAM: CT ABDOMEN AND PELVIS WITH CONTRAST TECHNIQUE: Multidetector CT imaging of the abdomen and pelvis was performed using the standard protocol following bolus administration of intravenous contrast. CONTRAST:  115mL OMNIPAQUE IOHEXOL 300 MG/ML  SOLN COMPARISON:  08/22/2021 FINDINGS: Lower chest: No acute abnormality. Hepatobiliary: Scattered hypodensities are noted throughout the liver similar to that seen on prior exam consistent with cysts. The previously seen cholecystostomy tube has been removed in the interval. The gallbladder is partially distended with considerable wall thickening identified. Pericholecystic inflammatory changes are noted as well. These findings are highly suspicious for recurrent acute cholecystitis. Ultrasound may be helpful for further evaluation. Pancreas: Unremarkable. No pancreatic ductal dilatation or  surrounding inflammatory changes. Spleen: Normal in size without focal abnormality. Adrenals/Urinary Tract: Adrenal glands are within normal limits. Kidneys demonstrate a normal enhancement pattern bilaterally. Normal excretion of contrast is seen bilaterally. No renal calculi or obstructive changes are seen. The bladder is decompressed. Stomach/Bowel: The appendix is well visualized and within normal limits. No obstructive or inflammatory changes of the colon are seen. Small bowel is unremarkable. The stomach is within normal limits. Vascular/Lymphatic: Aortic atherosclerosis. No enlarged abdominal or pelvic lymph nodes. Reproductive: Uterus and bilateral adnexa are unremarkable. Other: No abdominal wall hernia or abnormality. No abdominopelvic ascites. Musculoskeletal: No acute or significant osseous findings. IMPRESSION: Wall thickening and pericholecystic inflammatory changes fluid suggestive of recurrent acute cholecystitis. Ultrasound may be  helpful for further evaluation. Stable hepatic cysts. No other acute abnormality is noted. Electronically Signed   By: Inez Catalina M.D.   On: 10/30/2021 20:58    ROS:  Pertinent items are noted in HPI.  Blood pressure (!) 111/44, pulse 73, temperature 99.7 F (37.6 C), resp. rate 18, height 5\' 5"  (1.651 m), weight 90.9 kg, SpO2 99 %. Physical Exam: Pleasant black female no acute distress Head is normocephalic, atraumatic Eyes are without scleral icterus Abdomen is soft, nontender, nondistended.  No right upper quadrant abdominal pain is noted.   Assessment/Plan: Impression: Right upper quadrant abdominal pain, resolved since admission.  Patient has normal liver enzyme tests as well as a normal white blood cell count.  No need for acute surgical invention at this time.  We will get ultrasound of the right upper quadrant.  Patient is at high risk for any surgical intervention.  Other etiologies could include gastritis, peptic ulcer disease. Plan: We will  continue to monitor patient.  May advance diet once ultrasound report reviewed.  Aviva Signs 10/31/2021, 9:18 AM

## 2021-11-01 DIAGNOSIS — K810 Acute cholecystitis: Secondary | ICD-10-CM | POA: Diagnosis not present

## 2021-11-01 LAB — CBC
HCT: 34.3 % — ABNORMAL LOW (ref 36.0–46.0)
Hemoglobin: 10.7 g/dL — ABNORMAL LOW (ref 12.0–15.0)
MCH: 30.6 pg (ref 26.0–34.0)
MCHC: 31.2 g/dL (ref 30.0–36.0)
MCV: 98 fL (ref 80.0–100.0)
Platelets: 156 10*3/uL (ref 150–400)
RBC: 3.5 MIL/uL — ABNORMAL LOW (ref 3.87–5.11)
RDW: 15.9 % — ABNORMAL HIGH (ref 11.5–15.5)
WBC: 8.2 10*3/uL (ref 4.0–10.5)
nRBC: 0 % (ref 0.0–0.2)

## 2021-11-01 LAB — BASIC METABOLIC PANEL
Anion gap: 6 (ref 5–15)
BUN: 9 mg/dL (ref 8–23)
CO2: 26 mmol/L (ref 22–32)
Calcium: 8.3 mg/dL — ABNORMAL LOW (ref 8.9–10.3)
Chloride: 105 mmol/L (ref 98–111)
Creatinine, Ser: 0.78 mg/dL (ref 0.44–1.00)
GFR, Estimated: >60 mL/min (ref >60)
Glucose, Bld: 152 mg/dL — ABNORMAL HIGH (ref 70–99)
Potassium: 3.9 mmol/L (ref 3.5–5.1)
Sodium: 137 mmol/L (ref 135–145)

## 2021-11-01 LAB — GLUCOSE, CAPILLARY
Glucose-Capillary: 109 mg/dL — ABNORMAL HIGH (ref 70–99)
Glucose-Capillary: 115 mg/dL — ABNORMAL HIGH (ref 70–99)
Glucose-Capillary: 149 mg/dL — ABNORMAL HIGH (ref 70–99)
Glucose-Capillary: 161 mg/dL — ABNORMAL HIGH (ref 70–99)

## 2021-11-01 LAB — MAGNESIUM: Magnesium: 1.6 mg/dL — ABNORMAL LOW (ref 1.7–2.4)

## 2021-11-01 MED ORDER — APIXABAN 5 MG PO TABS
5.0000 mg | ORAL_TABLET | Freq: Two times a day (BID) | ORAL | Status: DC
  Administered 2021-11-01 (×2): 5 mg via ORAL
  Filled 2021-11-01 (×3): qty 1

## 2021-11-01 NOTE — Progress Notes (Signed)
PROGRESS NOTE  SHARNELLE CAPPELLI NOM:[id number] DOB: 1941-07-28 DOA: 10/30/2021 PCP: [name], MD  HPI/Recap of past 24 hours:  Natalie Castro is a 80 y.o. 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  Patient has a recent admission for sepsis secondary to cholecystitis and E. coli bacteremia in July 2022.  She had a drain placed in the gallbladder lumen by interventional radiology, which successfully treated the patient's acute cholecystitis without surgery.  The Coley tube has since been removed.  The patient presents on 10/30/21 with increasing abdominal pain in the right upper quadrant that started around 1 PM on the day of admission.  Her pain has been slightly worsening.  Work-up revealed acute acalculus cholecystitis for which general surgery was consulted.  Seen by general surgery, no plan for surgical intervention due to high risk, signed off.  IV antibiotics continue for another day then will switch to oral antibiotics to complete course.  11/01/21: Patient was seen and examined at her bedside.  Reports her right upper quadrant abdominal pain is improved.  She denies any nausea at the time of this visit.  Her diet has been advanced to heart healthy carb modified diet from clear liquid by surgery.  We will continue to monitor today and keep on IV antibiotics.  We will switch to oral antibiotics tomorrow if no acute issues overnight and possibly discharge to home.    Assessment/Plan: Principal Problem:   Acute cholecystitis without calculus Active Problems:   Essential hypertension, benign   Complete heart block (HCC)/Saint Jude pacemaker   History of pulmonary embolus (PE)   Mixed hyperlipidemia   Uncontrolled type 2 diabetes mellitus with hyperglycemia (HCC)   Chronic diastolic CHF (congestive heart failure) (HCC)   Abdominal pain, right upper  quadrant  Acute acalculous cholecystitis Continue IV antibiotics, IV Rocephin and IV Flagyl day #3 Seen by general surgery, no plan for surgical intervention, signed off. Diet advanced today from clear liquid diet to heart healthy, low-fat diet. Type 2 diabetes HgA1C 6.8% Continue insulin sliding scale. Continue to hold off home oral hypoglycemics. HFpEF with complete heart block and Saint Jude pacemaker She denies any anginal symptoms. Continue home regimen, Lasix, nifedipine, Toprol-XL, losartan, p.o. hydralazine, Lipitor. History of PE Resume home oral anticoagulation, Eliquis sees no plan for surgical intervention. Hypertension             Blood pressure stable. Continue home oral antihypertensives.   DVT prophylaxis: Eliquis Consultants: General surgery, signed off Code Status: Full code Family Communication: None at bedside. Disposition Plan: Likely will discharge to home on 11/02/2021.        Objective: Vitals:   10/31/21 1242 10/31/21 2037 11/01/21 0518 11/01/21 1247  BP: (!) 128/51 (!) 122/44 (!) 124/43 (!) 143/61  Pulse: 64 61 60 60  Resp: 18 16 14 18   Temp: 98.7 F (37.1 C) 99.9 F (37.7 C) 99 F (37.2 C) 98.3 F (36.8 C)  TempSrc: Oral Oral  Oral  SpO2: 99% 95% 95% 99%  Weight:      Height:        Intake/Output Summary (Last 24 hours) at 11/01/2021 1431 Last data filed at 11/01/2021 1249 Gross per 24 hour  Intake 1177.48 ml  Output 1800 ml  Net -622.52 ml   Filed Weights   10/30/21 1827  Weight: 90.9 kg    Exam:  General: 80 y.o. year-old  female well-developed well-nourished in no acute distress.  She is alert and oriented x3.  Cardiovascular: Regular rate and rhythm no rubs or gallops.  Respiratory: Clear to auscultation with no wheezes or rales. Abdomen: Soft nontender normal bowel sounds present.  Musculoskeletal: No lower extremity edema bilaterally.   Skin: No ulcerative lesions noted. Psychiatry: Mood is appropriate for condition and  setting.   Data Reviewed: CBC: Recent Labs  Lab 10/30/21 1902 10/31/21 0349 11/01/21 1024  WBC 8.8 10.9* 8.2  HGB 12.6 10.8* 10.7*  HCT 39.4 33.8* 34.3*  MCV 98.5 97.4 98.0  PLT 181 170 562   Basic Metabolic Panel: Recent Labs  Lab 10/30/21 1902 10/31/21 0349 11/01/21 1024  NA 141 140 137  K 3.6 3.4* 3.9  CL 105 106 105  CO2 24 26 26   GLUCOSE 183* 121* 152*  BUN 16 13 9   CREATININE 0.83 0.78 0.78  CALCIUM 9.4 8.4* 8.3*  MG  --   --  1.6*   GFR: Estimated Creatinine Clearance: 63.6 mL/min (by C-G formula based on SCr of 0.78 mg/dL). Liver Function Tests: Recent Labs  Lab 10/30/21 1902 10/31/21 0349  AST 16 12*  ALT 10 8  ALKPHOS 62 47  BILITOT 0.6 0.3  PROT 8.1 6.4*  ALBUMIN 3.5 2.7*   Recent Labs  Lab 10/30/21 1902  LIPASE 34   No results for input(s): AMMONIA in the last 168 hours. Coagulation Profile: No results for input(s): INR, PROTIME in the last 168 hours. Cardiac Enzymes: No results for input(s): CKTOTAL, CKMB, CKMBINDEX, TROPONINI in the last 168 hours. BNP (last 3 results) No results for input(s): PROBNP in the last 8760 hours. HbA1C: Recent Labs    10/30/21 1902  HGBA1C 6.8*   CBG: Recent Labs  Lab 10/31/21 1117 10/31/21 1619 10/31/21 2150 11/01/21 0744 11/01/21 1125  GLUCAP 164* 123* 122* 109* 115*   Lipid Profile: No results for input(s): CHOL, HDL, LDLCALC, TRIG, CHOLHDL, LDLDIRECT in the last 72 hours. Thyroid Function Tests: No results for input(s): TSH, T4TOTAL, FREET4, T3FREE, THYROIDAB in the last 72 hours. Anemia Panel: No results for input(s): VITAMINB12, FOLATE, FERRITIN, TIBC, IRON, RETICCTPCT in the last 72 hours. Urine analysis:    Component Value Date/Time   COLORURINE YELLOW 10/30/2021 1954   APPEARANCEUR CLEAR 10/30/2021 1954   LABSPEC 1.011 10/30/2021 1954   PHURINE 5.0 10/30/2021 1954   GLUCOSEU NEGATIVE 10/30/2021 1954   HGBUR SMALL (A) 10/30/2021 [place] NEGATIVE 10/30/2021 1954    KETONESUR NEGATIVE 10/30/2021 1954   PROTEINUR NEGATIVE 10/30/2021 1954   UROBILINOGEN 0.2 06/18/2015 2300   NITRITE NEGATIVE 10/30/2021 1954   LEUKOCYTESUR NEGATIVE 10/30/2021 1954   Sepsis Labs: @LABRCNTIP (procalcitonin:4,lacticidven:4)  )No results found for this or any previous visit (from the past 240 hour(s)).    Studies: No results found.  Scheduled Meds:  atorvastatin  40 mg Oral QHS   furosemide  40 mg Oral Daily   hydrALAZINE  50 mg Oral TID   insulin aspart  0-15 Units Subcutaneous TID WC   insulin aspart  0-5 Units Subcutaneous QHS   insulin glargine-yfgn  30 Units Subcutaneous QHS   losartan  100 mg Oral Daily   metoprolol succinate  50 mg Oral BID   NIFEdipine  30 mg Oral Daily   potassium chloride  10 mEq Oral Daily    Continuous Infusions:  cefTRIAXone (ROCEPHIN)  IV 2 g (10/31/21 2155)   metronidazole 500 mg (11/01/21 0842)     LOS: 2 days  Kayleen Memos, MD [facility] Pager [phone]  If 7PM-7AM, please contact night-coverage [email or web address] Password [facility] 11/01/2021, 2:31 PM

## 2021-11-01 NOTE — Progress Notes (Signed)
Subjective: Patient is hungry.  She denies any abdominal pain, nausea, or vomiting.  Objective: Vital signs in last 24 hours: Temp:  [98.7 F (37.1 C)-99.9 F (37.7 C)] 99 F (37.2 C) (11/07 0518) Pulse Rate:  [60-64] 60 (11/07 0518) Resp:  [14-18] 14 (11/07 0518) BP: (122-128)/(43-51) 124/43 (11/07 0518) SpO2:  [95 %-99 %] 95 % (11/07 0518) Last BM Date: 10/30/21  Intake/Output from previous day: 11/06 0701 - 11/07 0700 In: 1181.5 [P.O.:1080; IV Piggyback:101.5] Out: 1250 [Urine:1250] Intake/Output this shift: Total I/O In: 356 [P.O.:356] Out: -   General appearance: alert, cooperative, and no distress GI: soft, non-tender; bowel sounds normal; no masses,  no organomegaly  Lab Results:  Recent Labs    10/31/21 0349 11/01/21 1024  WBC 10.9* 8.2  HGB 10.8* 10.7*  HCT 33.8* 34.3*  PLT 170 156   BMET Recent Labs    10/31/21 0349 11/01/21 1024  NA 140 137  K 3.4* 3.9  CL 106 105  CO2 26 26  GLUCOSE 121* 152*  BUN 13 9  CREATININE 0.78 0.78  CALCIUM 8.4* 8.3*   PT/INR No results for input(s): LABPROT, INR in the last 72 hours.  Studies/Results: CT ABDOMEN PELVIS W CONTRAST  Result Date: 10/30/2021 CLINICAL DATA:  Acute abdominal pain EXAM: CT ABDOMEN AND PELVIS WITH CONTRAST TECHNIQUE: Multidetector CT imaging of the abdomen and pelvis was performed using the standard protocol following bolus administration of intravenous contrast. CONTRAST:  173mL OMNIPAQUE IOHEXOL 300 MG/ML  SOLN COMPARISON:  08/22/2021 FINDINGS: Lower chest: No acute abnormality. Hepatobiliary: Scattered hypodensities are noted throughout the liver similar to that seen on prior exam consistent with cysts. The previously seen cholecystostomy tube has been removed in the interval. The gallbladder is partially distended with considerable wall thickening identified. Pericholecystic inflammatory changes are noted as well. These findings are highly suspicious for recurrent acute cholecystitis.  Ultrasound may be helpful for further evaluation. Pancreas: Unremarkable. No pancreatic ductal dilatation or surrounding inflammatory changes. Spleen: Normal in size without focal abnormality. Adrenals/Urinary Tract: Adrenal glands are within normal limits. Kidneys demonstrate a normal enhancement pattern bilaterally. Normal excretion of contrast is seen bilaterally. No renal calculi or obstructive changes are seen. The bladder is decompressed. Stomach/Bowel: The appendix is well visualized and within normal limits. No obstructive or inflammatory changes of the colon are seen. Small bowel is unremarkable. The stomach is within normal limits. Vascular/Lymphatic: Aortic atherosclerosis. No enlarged abdominal or pelvic lymph nodes. Reproductive: Uterus and bilateral adnexa are unremarkable. Other: No abdominal wall hernia or abnormality. No abdominopelvic ascites. Musculoskeletal: No acute or significant osseous findings. IMPRESSION: Wall thickening and pericholecystic inflammatory changes fluid suggestive of recurrent acute cholecystitis. Ultrasound may be helpful for further evaluation. Stable hepatic cysts. No other acute abnormality is noted. Electronically Signed   By: Inez Catalina M.D.   On: 10/30/2021 20:58   US Abdomen Limited RUQ (LIVER/GB)  Result Date: 10/31/2021 CLINICAL DATA:  Suspected acute cholecystitis on CT scan from October 30, 2021 EXAM: ULTRASOUND ABDOMEN LIMITED RIGHT UPPER QUADRANT COMPARISON:  None. FINDINGS: Gallbladder: Gallbladder wall thickening to 6.1 mm is identified. There is sludge in the gallbladder. A small amount of pericholecystic fluid is identified. No Murphy's sign reported. Common bile duct: Diameter: 5.7 mm Liver: 2.5 cm cyst in the right hepatic lobe. No other abnormalities. Portal vein is patent on color Doppler imaging with normal direction of blood flow towards the liver. Other: None. IMPRESSION: 1. Gallbladder wall thickening, pericholecystic fluid, and sludge. While a  Murphy's sign was  not reported, the findings on yesterday's CT scan are highly suggestive of acute cholecystitis. 2. 2.5 cm cyst in the right hepatic lobe. 3. No other abnormalities. Electronically Signed   By: Dorise Bullion III M.D.   On: 10/31/2021 11:49    Anti-infectives: Anti-infectives (From admission, onward)    Start     Dose/Rate Route Frequency Ordered Stop   10/30/21 2145  cefTRIAXone (ROCEPHIN) 2 g in sodium chloride 0.9 % 100 mL IVPB        2 g 200 mL/hr over 30 Minutes Intravenous Every 24 hours 10/30/21 2136     10/30/21 2145  metroNIDAZOLE (FLAGYL) IVPB 500 mg        500 mg 100 mL/hr over 60 Minutes Intravenous Every 12 hours 10/30/21 2136     10/30/21 2130  ceFEPIme (MAXIPIME) 1 g in sodium chloride 0.9 % 100 mL IVPB  Status:  Discontinued       See Hyperspace for full Linked Orders Report.   1 g 200 mL/hr over 30 Minutes Intravenous  Once 10/30/21 2124 10/30/21 2135   10/30/21 2130  metroNIDAZOLE (FLAGYL) IVPB 500 mg  Status:  Discontinued       See Hyperspace for full Linked Orders Report.   500 mg 100 mL/hr over 60 Minutes Intravenous  Once 10/30/21 2124 10/30/21 2135       Assessment/Plan: Impression: Epigastric/upper abdominal pain with nausea, resolved.  She has not had any significant episodes since her admission.  Ultrasound did not reveal the mildly thickened gallbladder wall.  There was a negative Murphy sign present.  As patient is a high risk for any surgical intervention, I would not recommend cholecystectomy at this time.  Patient understands and agrees.  Will advance to regular diet.  She should tolerate this, she is okay for discharge from surgery standpoint.  LOS: 2 days    Aviva Signs 11/01/2021

## 2021-11-02 DIAGNOSIS — K810 Acute cholecystitis: Secondary | ICD-10-CM | POA: Diagnosis not present

## 2021-11-02 LAB — COMPREHENSIVE METABOLIC PANEL
ALT: 8 U/L (ref 0–44)
AST: 10 U/L — ABNORMAL LOW (ref 15–41)
Albumin: 2.4 g/dL — ABNORMAL LOW (ref 3.5–5.0)
Alkaline Phosphatase: 47 U/L (ref 38–126)
Anion gap: 3 — ABNORMAL LOW (ref 5–15)
BUN: 12 mg/dL (ref 8–23)
CO2: 28 mmol/L (ref 22–32)
Calcium: 8.3 mg/dL — ABNORMAL LOW (ref 8.9–10.3)
Chloride: 105 mmol/L (ref 98–111)
Creatinine, Ser: 0.75 mg/dL (ref 0.44–1.00)
GFR, Estimated: >60 mL/min (ref >60)
Glucose, Bld: 100 mg/dL — ABNORMAL HIGH (ref 70–99)
Potassium: 3.9 mmol/L (ref 3.5–5.1)
Sodium: 136 mmol/L (ref 135–145)
Total Bilirubin: 0.3 mg/dL (ref 0.3–1.2)
Total Protein: 6.2 g/dL — ABNORMAL LOW (ref 6.5–8.1)

## 2021-11-02 LAB — CBC
HCT: 35.2 % — ABNORMAL LOW (ref 36.0–46.0)
Hemoglobin: 11.1 g/dL — ABNORMAL LOW (ref 12.0–15.0)
MCH: 30.7 pg (ref 26.0–34.0)
MCHC: 31.5 g/dL (ref 30.0–36.0)
MCV: 97.2 fL (ref 80.0–100.0)
Platelets: 165 10*3/uL (ref 150–400)
RBC: 3.62 MIL/uL — ABNORMAL LOW (ref 3.87–5.11)
RDW: 15.5 % (ref 11.5–15.5)
WBC: 6.3 10*3/uL (ref 4.0–10.5)
nRBC: 0 % (ref 0.0–0.2)

## 2021-11-02 LAB — GLUCOSE, CAPILLARY
Glucose-Capillary: 94 mg/dL (ref 70–99)
Glucose-Capillary: 166 mg/dL — ABNORMAL HIGH (ref 70–99)
Glucose-Capillary: 147 mg/dL — ABNORMAL HIGH (ref 70–99)
Glucose-Capillary: 170 mg/dL — ABNORMAL HIGH (ref 70–99)

## 2021-11-02 LAB — MAGNESIUM: Magnesium: 1.7 mg/dL (ref 1.7–2.4)

## 2021-11-02 LAB — PHOSPHORUS: Phosphorus: 3.3 mg/dL (ref 2.5–4.6)

## 2021-11-02 NOTE — Progress Notes (Signed)
PROGRESS NOTE  Natalie Castro XVQ:[id number] DOB: 1941/07/15 DOA: 10/30/2021 PCP: [name], MD  HPI/Recap of past 24 hours: Natalie Castro is a 80 y.o. 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 coli bacteremia in July 2022.  Had a percutaneous cholecystostomy placement by IR which was removed approximately 8 weeks later.  The patient presents on 10/30/21 with increasing right upper quadrant abdominal pain x1 day.  Work-up revealed wall thickening and pericholecystic inflammatory changes, seen on CT scan 10/30/2021.  Right upper quadrant abdominal ultrasound 10/31/2021 revealed gallbladder wall thickening, pericholecystic fluid and sludge.  Seen by general surgery, no plan for surgical intervention due to high risk, signed off.  IV antibiotics continued and IR consulted for possible percutaneous cholecystostomy placement.  Per Dr. Pascal Lux, IR, tentative [place] placement on Thursday, 11/04/2021, to allow for washout of Eliquis.  11/02/21: Seen at her bedside.  Right upper quadrant abdominal pain is improved.  Assessment/Plan: Principal Problem:   Acute cholecystitis without calculus Active Problems:   Essential hypertension, benign   Complete heart block (HCC)/Saint Jude pacemaker   History of pulmonary embolus (PE)   Mixed hyperlipidemia   Uncontrolled type 2 diabetes mellitus with hyperglycemia (HCC)   Chronic diastolic CHF (congestive heart failure) (HCC)   Abdominal pain, right upper quadrant  Recurrent acute acalculous cholecystitis Continue IV antibiotics, IV Rocephin and IV Flagyl day #4 Seen by general surgery, no plan for surgical intervention, signed off. Continue low-fat diet. Per Dr. Pascal Lux, IR, tentative [place] placement on Thursday, 11/04/2021, after 48 hours from last dose of Eliquis to  allow for washout of Eliquis. Type 2 diabetes HgA1C 6.8% Continue insulin sliding scale. Continue to hold off home oral hypoglycemics. HFpEF with complete heart block and Saint Jude pacemaker She denies any anginal symptoms. Continue home regimen, Lasix, nifedipine, Toprol-XL, losartan, p.o. hydralazine, Lipitor. History of PE Continue to hold off Eliquis in anticipation for IR procedure. Hypertension             Blood pressure stable. Continue home oral antihypertensives.   DVT prophylaxis: Eliquis Consultants: General surgery, signed off Code Status: Full code Family Communication: None at bedside. Disposition Plan: Likely will discharge to home on 11/02/2021.        Objective: Vitals:   11/01/21 2048 11/01/21 2233 11/02/21 0614 11/02/21 1426  BP: (!) 119/48 (!) 136/48 (!) 134/54 (!) 132/56  Pulse: 65  (!) 59 62  Resp: 20  18 18   Temp: 98.5 F (36.9 C)  98.5 F (36.9 C) 98.6 F (37 C)  TempSrc: Oral  Oral Oral  SpO2: 97%  99% 99%  Weight:      Height:        Intake/Output Summary (Last 24 hours) at 11/02/2021 1737 Last data filed at 11/02/2021 1300 Gross per 24 hour  Intake 680 ml  Output 1700 ml  Net -1020 ml   Filed Weights   10/30/21 1827  Weight: 90.9 kg    Exam:  General: 80 y.o. year-old female well-developed well-nourished in no acute distress.  She is alert and oriented x3.  Cardiovascular: Regular rate and rhythm no rubs or gallops. Respiratory: Clear to auscultation no wheeze or rales.  Abdomen: Soft, right upper quadrant mildly tender.  Bowel sounds present.   Musculoskeletal: No lower extremity edema bilaterally.   Skin: No ulcerative lesions noted. Psychiatry: Mood  is appropriate for condition and setting.   Data Reviewed: CBC: Recent Labs  Lab 10/30/21 1902 10/31/21 0349 11/01/21 1024 11/02/21 0619  WBC 8.8 10.9* 8.2 6.3  HGB 12.6 10.8* 10.7* 11.1*  HCT 39.4 33.8* 34.3* 35.2*  MCV 98.5 97.4 98.0 97.2  PLT 181 170 156 096   Basic  Metabolic Panel: Recent Labs  Lab 10/30/21 1902 10/31/21 0349 11/01/21 1024 11/02/21 0619  NA 141 140 137 136  K 3.6 3.4* 3.9 3.9  CL 105 106 105 105  CO2 24 26 26 28   GLUCOSE 183* 121* 152* 100*  BUN 16 13 9 12   CREATININE 0.83 0.78 0.78 0.75  CALCIUM 9.4 8.4* 8.3* 8.3*  MG  --   --  1.6* 1.7  PHOS  --   --   --  3.3   GFR: Estimated Creatinine Clearance: 63.6 mL/min (by C-G formula based on SCr of 0.75 mg/dL). Liver Function Tests: Recent Labs  Lab 10/30/21 1902 10/31/21 0349 11/02/21 0619  AST 16 12* 10*  ALT 10 8 8   ALKPHOS 62 47 47  BILITOT 0.6 0.3 0.3  PROT 8.1 6.4* 6.2*  ALBUMIN 3.5 2.7* 2.4*   Recent Labs  Lab 10/30/21 1902  LIPASE 34   No results for input(s): AMMONIA in the last 168 hours. Coagulation Profile: No results for input(s): INR, PROTIME in the last 168 hours. Cardiac Enzymes: No results for input(s): CKTOTAL, CKMB, CKMBINDEX, TROPONINI in the last 168 hours. BNP (last 3 results) No results for input(s): PROBNP in the last 8760 hours. HbA1C: Recent Labs    10/30/21 1902  HGBA1C 6.8*   CBG: Recent Labs  Lab 11/01/21 1700 11/01/21 2300 11/02/21 0734 11/02/21 1103 11/02/21 1638  GLUCAP 149* 161* 94 166* 147*   Lipid Profile: No results for input(s): CHOL, HDL, LDLCALC, TRIG, CHOLHDL, LDLDIRECT in the last 72 hours. Thyroid Function Tests: No results for input(s): TSH, T4TOTAL, FREET4, T3FREE, THYROIDAB in the last 72 hours. Anemia Panel: No results for input(s): VITAMINB12, FOLATE, FERRITIN, TIBC, IRON, RETICCTPCT in the last 72 hours. Urine analysis:    Component Value Date/Time   COLORURINE YELLOW 10/30/2021 1954   APPEARANCEUR CLEAR 10/30/2021 1954   LABSPEC 1.011 10/30/2021 1954   PHURINE 5.0 10/30/2021 1954   GLUCOSEU NEGATIVE 10/30/2021 1954   HGBUR SMALL (A) 10/30/2021 [place] NEGATIVE 10/30/2021 1954   KETONESUR NEGATIVE 10/30/2021 1954   PROTEINUR NEGATIVE 10/30/2021 1954   UROBILINOGEN 0.2 06/18/2015  2300   NITRITE NEGATIVE 10/30/2021 1954   LEUKOCYTESUR NEGATIVE 10/30/2021 1954   Sepsis Labs: @LABRCNTIP (procalcitonin:4,lacticidven:4)  )No results found for this or any previous visit (from the past 240 hour(s)).    Studies: No results found.  Scheduled Meds:  atorvastatin  40 mg Oral QHS   furosemide  40 mg Oral Daily   hydrALAZINE  50 mg Oral TID   insulin aspart  0-15 Units Subcutaneous TID WC   insulin aspart  0-5 Units Subcutaneous QHS   insulin glargine-yfgn  30 Units Subcutaneous QHS   losartan  100 mg Oral Daily   metoprolol succinate  50 mg Oral BID   NIFEdipine  30 mg Oral Daily   potassium chloride  10 mEq Oral Daily    Continuous Infusions:  cefTRIAXone (ROCEPHIN)  IV 2 g (11/01/21 2226)   metronidazole 500 mg (11/02/21 1100)     LOS: 3 days     Kayleen Memos, MD [facility] Pager [phone]  If 7PM-7AM, please contact night-coverage [email or web address] Password [email or web address]  11/02/2021, 5:37 PM

## 2021-11-02 NOTE — Plan of Care (Signed)
  Problem: Acute Rehab PT Goals(only PT should resolve) Goal: Patient Will Transfer Sit To/From Stand Outcome: Progressing Flowsheets (Taken 11/02/2021 1215) Patient will transfer sit to/from stand: with modified independence Goal: Pt Will Ambulate Outcome: Progressing Flowsheets (Taken 11/02/2021 1215) Pt will Ambulate:  > 125 feet  with modified independence  with rolling walker   Tori Karol Liendo PT, DPT 11/02/21, 12:15 PM

## 2021-11-02 NOTE — Progress Notes (Addendum)
IR procedure request for cholecystomy tube placement.  80 y.o. female inpatient. History of complete heart block s/p pacemaker, DM , PE ( 2017) on eliquis. Recently admitted for sepsis secondary to cholecystitis and e coli bacteriemia. IR placed a cholecystomy tube on 7.14.22. the tube was capped on 9.3.22 and then subsequently removed. Patient presented to the ED at [facility] on 11.5.22 with a sudden onset of abdominal pain. No leukocytosis, afebrile. Blood cultures were not drawn. CT abd pelvis from 11.5.22 reads Wall thickening and pericholecystic inflammatory changes fluid suggestive of recurrent acute cholecystitis. Ultrasound may be helpful for further evaluation. [place] abd limited from 11.6.22 reads Gallbladder wall thickening, pericholecystic fluid, and sludge. While a Murphy's sign was not reported, the findings on yesterday's CT scan are highly suggestive of acute cholecystitis. Patient's last dose of eliquis was on 11.7.22 @ 2255. Team is requesting cholecystomy tube placement.  Case discussed with IR Attending(s) Dr. Pascal Lux, Dr. Serafina Royals and Dr. Dwaine Gale. Patient tentatively scheduled for 11.10.22. team instructed to:  Team instructed to: Keep Patient to be NPO after midnight Hold eliquis Transfer patient to [facility] for procedure with return transportation to [facility] post procedure. IR will call patient when ready.  Should patient condition change and patient decompensates or becomes septic please notify IR for emergent procedure.

## 2021-11-02 NOTE — Evaluation (Signed)
Physical Therapy Evaluation Patient Details Name: Natalie Castro MRN: [id number] DOB: 09/21/1941 Today's Date: 11/02/2021  History of Present Illness  Natalie Castro is a 80 y.o. female presents with abdominal pain, nausea and vomitting; admitted with acute cholecystitis. Pt recently admitted for sepsis secondary to cholecystitis and E. coli bacteremia in July 2022, he had a drain placed in the gallbladder lumen, which successfully treated the patient's acute cholecystitis without surgery.  The [place] tube has since been removed. PMH: complete heart block with pacemaker, HTN, CHF, PE, diabetes   Clinical Impression  Pt admitted with above diagnosis. Pt reports ind at baseline, lives with daughter who works during the day, another daughter lives next door and son lives down the street, all check on pt PRN throughout the day. Pt uses rollator or RW in the home and community, occasionally mobilizes around the home in a w/c due to fear of falling. Pt currently ambulating around room and in hallway with supv, able to clear past obstacles and manage doors without LOB. Pt requires min A to rise from low seated toilet; has toilet riser at home. Pt motivated to return home with family assisting as needed. Pt currently with functional limitations due to the deficits listed below (see PT Problem List). Pt will benefit from skilled PT to increase their independence and safety with mobility to allow discharge to the venue listed below.          Recommendations for follow up therapy are one component of a multi-disciplinary discharge planning process, led by the attending physician.  Recommendations may be updated based on patient status, additional functional criteria and insurance authorization.  Follow Up Recommendations No PT follow up    Assistance Recommended at Discharge PRN  Functional Status Assessment Patient has not had a recent decline in their functional status  Equipment Recommendations  None  recommended by PT    Recommendations for Other Services       Precautions / Restrictions Precautions Precautions: None Restrictions Weight Bearing Restrictions: No      Mobility  Bed Mobility Overal bed mobility: Modified Independent  General bed mobility comments: increased time to come to sitting EOB    Transfers Overall transfer level: Needs assistance Equipment used: Rolling walker (2 wheels) Transfers: Sit to/from Stand Sit to Stand: Supervision;Min assist   General transfer comment: min A to power to stand from low seated toilet, pt pulling on therapist's hand to upright self    Ambulation/Gait Ambulation/Gait assistance: Supervision Gait Distance (Feet): 100 Feet Assistive device: Rolling walker (2 wheels) Gait Pattern/deviations: Step-through pattern;Decreased stride length;Trunk flexed;WFL(Within Functional Limits) Gait velocity: decreased  General Gait Details: pt ambulates with trunk flexed, step through pattern WFL, no LOB, clears past obstacles, navigates doors without difficulty  Stairs            Wheelchair Mobility    Modified Rankin (Stroke Patients Only)       Balance Overall balance assessment: No apparent balance deficits (not formally assessed)       Pertinent Vitals/Pain Pain Assessment: No/denies pain    Home Living Family/patient expects to be discharged to:: Private residence Living Arrangements: Children Available Help at Discharge: Family;Available PRN/intermittently (daughter live with her work, Naval architect live next door and on live down the road) Type of Home: House Home Access: Stairs to enter Entrance Stairs-Rails: Right Entrance Stairs-Number of Steps: 2   Home Layout: One level Home Equipment: Conservation officer, nature (2 wheels);Rollator (4 wheels);Cane - single point;BSC/3in1;Shower seat;Wheelchair - manual  Prior Function Prior Level of Function : Independent/Modified Independent  Mobility Comments: pt reports uing  rollator, RW or w/c in the home and community ADLs Comments: pt reports ind with self care, simple meal prep     Hand Dominance   Dominant Hand: Right    Extremity/Trunk Assessment   Upper Extremity Assessment Upper Extremity Assessment: Overall WFL for tasks assessed    Lower Extremity Assessment Lower Extremity Assessment: Overall WFL for tasks assessed    Cervical / Trunk Assessment Cervical / Trunk Assessment: Kyphotic  Communication   Communication: No difficulties  Cognition Arousal/Alertness: Awake/alert Behavior During Therapy: WFL for tasks assessed/performed Overall Cognitive Status: Within Functional Limits for tasks assessed       General Comments      Exercises     Assessment/Plan    PT Assessment Patient needs continued PT services  PT Problem List Decreased activity tolerance;Obesity       PT Treatment Interventions DME instruction;Gait training;Functional mobility training;Therapeutic activities;Therapeutic exercise;Balance training;Neuromuscular re-education;Patient/family education    PT Goals (Current goals can be found in the Care Plan section)  Acute Rehab PT Goals Patient Stated Goal: return home PT Goal Formulation: With patient/family Time For Goal Achievement: 11/16/21 Potential to Achieve Goals: Good    Frequency Min 3X/week   Barriers to discharge        Co-evaluation               AM-PAC PT "6 Clicks" Mobility  Outcome Measure Help needed turning from your back to your side while in a flat bed without using bedrails?: None Help needed moving from lying on your back to sitting on the side of a flat bed without using bedrails?: None Help needed moving to and from a bed to a chair (including a wheelchair)?: A Little Help needed standing up from a chair using your arms (e.g., wheelchair or bedside chair)?: A Little Help needed to walk in hospital room?: A Little Help needed climbing 3-5 steps with a railing? : A Little 6  Click Score: 20    End of Session   Activity Tolerance: Patient tolerated treatment well Patient left: in chair;with call bell/phone within reach;with nursing/sitter in room;with family/visitor present Nurse Communication: Mobility status PT Visit Diagnosis: Other abnormalities of gait and mobility (R26.89)    Time: 7902-4097 PT Time Calculation (min) (ACUTE ONLY): 23 min   Charges:   PT Evaluation $PT Eval Low Complexity: 1 Low PT Treatments $Gait Training: 8-22 mins         Tori Christia Coaxum PT, DPT 11/02/21, 12:12 PM

## 2021-11-03 DIAGNOSIS — K810 Acute cholecystitis: Secondary | ICD-10-CM | POA: Diagnosis not present

## 2021-11-03 DIAGNOSIS — I5032 Chronic diastolic (congestive) heart failure: Secondary | ICD-10-CM

## 2021-11-03 DIAGNOSIS — R1011 Right upper quadrant pain: Secondary | ICD-10-CM

## 2021-11-03 LAB — COMPREHENSIVE METABOLIC PANEL
ALT: 9 U/L (ref 0–44)
AST: 10 U/L — ABNORMAL LOW (ref 15–41)
Albumin: 2.5 g/dL — ABNORMAL LOW (ref 3.5–5.0)
Alkaline Phosphatase: 48 U/L (ref 38–126)
Anion gap: 8 (ref 5–15)
BUN: 14 mg/dL (ref 8–23)
CO2: 26 mmol/L (ref 22–32)
Calcium: 8.4 mg/dL — ABNORMAL LOW (ref 8.9–10.3)
Chloride: 104 mmol/L (ref 98–111)
Creatinine, Ser: 0.7 mg/dL (ref 0.44–1.00)
GFR, Estimated: >60 mL/min (ref >60)
Glucose, Bld: 103 mg/dL — ABNORMAL HIGH (ref 70–99)
Potassium: 3.5 mmol/L (ref 3.5–5.1)
Sodium: 138 mmol/L (ref 135–145)
Total Bilirubin: 0.3 mg/dL (ref 0.3–1.2)
Total Protein: 6.4 g/dL — ABNORMAL LOW (ref 6.5–8.1)

## 2021-11-03 LAB — CBC
HCT: 33.3 % — ABNORMAL LOW (ref 36.0–46.0)
Hemoglobin: 10.8 g/dL — ABNORMAL LOW (ref 12.0–15.0)
MCH: 30.6 pg (ref 26.0–34.0)
MCHC: 32.4 g/dL (ref 30.0–36.0)
MCV: 94.3 fL (ref 80.0–100.0)
Platelets: 196 10*3/uL (ref 150–400)
RBC: 3.53 MIL/uL — ABNORMAL LOW (ref 3.87–5.11)
RDW: 15.4 % (ref 11.5–15.5)
WBC: 7 10*3/uL (ref 4.0–10.5)
nRBC: 0 % (ref 0.0–0.2)

## 2021-11-03 LAB — GLUCOSE, CAPILLARY
Glucose-Capillary: 104 mg/dL — ABNORMAL HIGH (ref 70–99)
Glucose-Capillary: 219 mg/dL — ABNORMAL HIGH (ref 70–99)
Glucose-Capillary: 120 mg/dL — ABNORMAL HIGH (ref 70–99)
Glucose-Capillary: 217 mg/dL — ABNORMAL HIGH (ref 70–99)

## 2021-11-03 LAB — PROTIME-INR
INR: 1.2 (ref 0.8–1.2)
Prothrombin Time: 14.7 seconds (ref 11.4–15.2)

## 2021-11-03 NOTE — Consult Note (Signed)
Chief Complaint: Patient was seen in consultation today for percutaneous cholecystostomy drain placement Chief Complaint  Patient presents with   Emesis   at the request of Dr Mickeal Needy   Supervising Physician: Ruthann Cancer  Patient Status: AP IP  History of Present Illness: Natalie Castro is a 80 y.o. female   Hx Pacemaker; HTN; CHF Hx PE (on Eliquis-- LD 11/7 pm) DM Admission for acute cholecystitis - sepsis Hx bacteremia Ecoli- July 2022 IR placed chole drain 07/08/21-- removed after 1 mo capping trial 09/22/21 in IR - Dr Serafina Royals Now with new onset RUQ pain- admitted 10/30/21; low grade temp  [place] 11/6: IMPRESSION: 1. Gallbladder wall thickening, pericholecystic fluid, and sludge. While a Murphy's sign was not reported, the findings on yesterday's CT scan are highly suggestive of acute cholecystitis. 2. 2.5 cm cyst in the right hepatic lobe. 3. No other abnormalities.  Dr Arnoldo Morale note 11/7: Impression: Epigastric/upper abdominal pain with nausea, resolved.  She has not had any significant episodes since her admission.  Ultrasound did not reveal the mildly thickened gallbladder wall.  There was a negative Murphy sign present.  As patient is a high risk for any surgical intervention, I would not recommend cholecystectomy at this time.  Patient understands and agrees.  Will advance to regular diet.  She should tolerate this, she is okay for discharge from surgery standpoint.  Request made for replacement of chole drain Dr Anselm Pancoast reviewed imaging and approves procedure Scheduled at [facility] 11/10---Off Eliquis 2 days   Past Medical History:  Diagnosis Date   Acid reflux    Arthritis    Colon adenomas    AGE 27   Complete heart block (HCC)    STJ PPM Dr. 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, long term anticoag with apixaban    Hyperlipidemia    Myoview 03/2021    Myoview 4/22: EF 50, no infarct or ischemia; low risk    Presence of permanent cardiac pacemaker    Type 2 diabetes mellitus ([place])     Past Surgical History:  Procedure Laterality Date   COLONOSCOPY     3 SIMPLE ADENOMAS, AGE 10   COLONOSCOPY N/A 05/24/2018   Procedure: COLONOSCOPY;  Surgeon: Danie Binder, MD;  [facility];  Service: Endoscopy;  Laterality: N/A;  1:00pm   EP IMPLANTABLE DEVICE N/A 11/24/2015   Procedure: Pacemaker Implant;  Surgeon: [name], MD;  Location: [facility];  Service: Cardiovascular;  Laterality: N/A;   IR EXCHANGE BILIARY DRAIN  07/27/2021   IR EXCHANGE BILIARY DRAIN  08/23/2021   IR PERC CHOLECYSTOSTOMY  07/08/2021   IR RADIOLOGIST EVAL & MGMT  09/22/2021   JOINT REPLACEMENT     knees bilat.   LEFT HEART CATH AND CORONARY ANGIOGRAPHY N/A 07/02/2021   Procedure: LEFT HEART CATH AND CORONARY ANGIOGRAPHY;  Surgeon: Jettie Booze, MD;  Location: [facility];  Service: Cardiovascular;  Laterality: N/A;   POLYPECTOMY  05/24/2018   Procedure: POLYPECTOMY;  Surgeon: Danie Binder, MD;  [facility]: [facility];  Service: Endoscopy;;  ascending and hepatic flexure, transverse   TEE WITHOUT CARDIOVERSION N/A 08/27/2021   Procedure: TRANSESOPHAGEAL ECHOCARDIOGRAM (TEE);  Surgeon: Pixie Casino, MD;  Location: [facility] ENDOSCOPY;  Service: Cardiovascular;  Laterality: N/A;   TUBAL LIGATION      Allergies: Penicillins  Medications: Prior to Admission medications   Medication Sig Start Date End Date  Taking? Authorizing Provider  acetaminophen (TYLENOL) 500 MG tablet Take 1,000 mg by mouth every 6 (six) hours as needed.   Yes [provider]  atorvastatin (LIPITOR) 40 MG tablet TAKE (1) TABLET BY MOUTH AT BEDTIME. Patient taking differently: Take 40 mg by mouth at bedtime. 05/28/20  Yes Corum, Rex Kras, MD  ELIQUIS 5 MG TABS tablet TAKE 1 TABLET BY MOUTH TWICE A DAY. 05/05/20  Yes Corum, Rex Kras, MD  furosemide (LASIX) 40 MG tablet Take 1 tablet (40 mg total) by mouth daily. 08/19/21  Yes  Evans Lance, MD  hydrALAZINE (APRESOLINE) 50 MG tablet Take 1 tablet (50 mg total) by mouth 2 (two) times daily. Patient taking differently: Take 50 mg by mouth 3 (three) times daily. 07/11/21 10/30/21 Yes Elodia Florence., MD  LANTUS SOLOSTAR 100 UNIT/ML Solostar Pen ADMINISTER 30 UNITS UNDER THE SKIN AT BEDTIME Patient taking differently: Inject 30 Units into the skin at bedtime. 08/06/21  Yes Nida, Marella Chimes, MD  losartan (COZAAR) 100 MG tablet TAKE (1) TABLET BY MOUTH ONCE DAILY. Patient taking differently: Take 100 mg by mouth daily. 05/28/20  Yes Corum, Rex Kras, MD  metFORMIN (GLUCOPHAGE) 1000 MG tablet TAKE (1) TABLET BY MOUTH TWICE DAILY. 05/17/21  Yes Nida, Marella Chimes, MD  metoprolol succinate (TOPROL-XL) 50 MG 24 hr tablet Take 1 tablet (50 mg total) by mouth 2 (two) times daily. Take with or immediately following a meal. 09/07/21  Yes Evans Lance, MD  NIFEdipine (PROCARDIA-XL/NIFEDICAL-XL) 30 MG 24 hr tablet Take 30 mg by mouth daily. 10/26/21  Yes [provider]  potassium chloride (KLOR-CON) 10 MEQ tablet Take 1 tablet (10 mEq total) by mouth daily. 08/19/21  Yes Evans Lance, MD  cefadroxil (DURICEF) 500 MG capsule Take 1 capsule (500 mg total) by mouth 2 (two) times daily. Patient not taking: No sig reported 08/28/21   Cristal Deer, MD  glucose blood test strip Use to check blood glucose fasting , before lunch and dinner and after your largest meal 11/04/19   Corum, Rex Kras, MD  guaiFENesin (MUCINEX) 600 MG 12 hr tablet Take 1 tablet (600 mg total) by mouth 2 (two) times daily as needed for cough. Patient not taking: No sig reported 08/28/21   Cristal Deer, MD  Lancets [facility] ULTRASOFT) lancets Use to check glucose 4 x daily 11/04/19   Corum, Rex Kras, MD  nitroGLYCERIN (NITROSTAT) 0.4 MG SL tablet Place 1 tablet (0.4 mg total) under the tongue every 5 (five) minutes as needed for chest pain. Patient not taking: Reported on 10/30/2021 02/24/21   Richardson Dopp T, PA-C  polyethylene glycol (MIRALAX / GLYCOLAX) 17 g packet Take 17 g by mouth daily as needed for mild constipation. Patient not taking: No sig reported 08/28/21   Cristal Deer, MD     Family History  Problem Relation Age of Onset   Heart failure Mother    Hypertension Mother    Heart failure Sister    Hypertension Father    Cancer Sister    Dementia Sister    Colon cancer Neg Hx    Colon polyps Neg Hx     Social History   Socioeconomic History   Marital status: Widowed    Spouse name: Not on file   Number of children: Not on file   Years of education: Not on file   Highest education level: Not on file  Occupational History   Occupation: retired  Tobacco Use   Smoking status:  Never   Smokeless tobacco: Never  Vaping Use   Vaping Use: Never used  Substance and Sexual Activity   Alcohol use: No    Alcohol/week: 0.0 standard drinks   Drug use: No   Sexual activity: Yes    Birth control/protection: Post-menopausal  Other Topics Concern   Not on file  Social History Narrative   MARRIED FOR 76 YRS. 5 KIDS: #4 PRESENT TODAY(AGE 46)   Social Determinants of Health   Financial Resource Strain: Not on file  Food Insecurity: Not on file  Transportation Needs: Not on file  Physical Activity: Not on file  Stress: Not on file  Social Connections: Not on file    Review of Systems: A 12 point ROS discussed and pertinent positives are indicated in the HPI above.  All other systems are negative.  Review of Systems  Constitutional:  Positive for activity change, appetite change and fatigue. Negative for fever.  Respiratory:  Negative for cough and shortness of breath.   Cardiovascular:  Negative for chest pain.  Gastrointestinal:  Positive for abdominal pain.  Neurological:  Positive for weakness.  Psychiatric/Behavioral:  Negative for behavioral problems and confusion.    Vital Signs: BP 130/64 (BP Location: Left Arm)   Pulse (!) 59   Temp 98.5 F (36.9 C)  (Oral)   Resp 17   Ht 5\' 5"  (1.651 m)   Wt 200 lb 6.4 oz (90.9 kg)   SpO2 98%   BMI 33.35 kg/m   Physical Exam Vitals reviewed.  HENT:     Mouth/Throat:     Mouth: Mucous membranes are moist.  Cardiovascular:     Rate and Rhythm: Normal rate and regular rhythm.     Heart sounds: Normal heart sounds.  Pulmonary:     Effort: Pulmonary effort is normal.     Breath sounds: Normal breath sounds.  Abdominal:     Palpations: Abdomen is soft.     Tenderness: There is no abdominal tenderness.  Musculoskeletal:        General: Normal range of motion.  Skin:    General: Skin is warm.  Neurological:     Mental Status: She is alert and oriented to person, place, and time.  Psychiatric:        Behavior: Behavior normal.    Imaging: CT ABDOMEN PELVIS W CONTRAST  Result Date: 10/30/2021 CLINICAL DATA:  Acute abdominal pain EXAM: CT ABDOMEN AND PELVIS WITH CONTRAST TECHNIQUE: Multidetector CT imaging of the abdomen and pelvis was performed using the standard protocol following bolus administration of intravenous contrast. CONTRAST:  174mL OMNIPAQUE IOHEXOL 300 MG/ML  SOLN COMPARISON:  08/22/2021 FINDINGS: Lower chest: No acute abnormality. Hepatobiliary: Scattered hypodensities are noted throughout the liver similar to that seen on prior exam consistent with cysts. The previously seen cholecystostomy tube has been removed in the interval. The gallbladder is partially distended with considerable wall thickening identified. Pericholecystic inflammatory changes are noted as well. These findings are highly suspicious for recurrent acute cholecystitis. Ultrasound may be helpful for further evaluation. Pancreas: Unremarkable. No pancreatic ductal dilatation or surrounding inflammatory changes. Spleen: Normal in size without focal abnormality. Adrenals/Urinary Tract: Adrenal glands are within normal limits. Kidneys demonstrate a normal enhancement pattern bilaterally. Normal excretion of contrast is seen  bilaterally. No renal calculi or obstructive changes are seen. The bladder is decompressed. Stomach/Bowel: The appendix is well visualized and within normal limits. No obstructive or inflammatory changes of the colon are seen. Small bowel is unremarkable. The stomach is within normal  limits. Vascular/Lymphatic: Aortic atherosclerosis. No enlarged abdominal or pelvic lymph nodes. Reproductive: Uterus and bilateral adnexa are unremarkable. Other: No abdominal wall hernia or abnormality. No abdominopelvic ascites. Musculoskeletal: No acute or significant osseous findings. IMPRESSION: Wall thickening and pericholecystic inflammatory changes fluid suggestive of recurrent acute cholecystitis. Ultrasound may be helpful for further evaluation. Stable hepatic cysts. No other acute abnormality is noted. Electronically Signed   By: Inez Catalina M.D.   On: 10/30/2021 20:58   US Abdomen Limited RUQ (LIVER/GB)  Result Date: 10/31/2021 CLINICAL DATA:  Suspected acute cholecystitis on CT scan from October 30, 2021 EXAM: ULTRASOUND ABDOMEN LIMITED RIGHT UPPER QUADRANT COMPARISON:  None. FINDINGS: Gallbladder: Gallbladder wall thickening to 6.1 mm is identified. There is sludge in the gallbladder. A small amount of pericholecystic fluid is identified. No Murphy's sign reported. Common bile duct: Diameter: 5.7 mm Liver: 2.5 cm cyst in the right hepatic lobe. No other abnormalities. Portal vein is patent on color Doppler imaging with normal direction of blood flow towards the liver. Other: None. IMPRESSION: 1. Gallbladder wall thickening, pericholecystic fluid, and sludge. While a Murphy's sign was not reported, the findings on yesterday's CT scan are highly suggestive of acute cholecystitis. 2. 2.5 cm cyst in the right hepatic lobe. 3. No other abnormalities. Electronically Signed   By: Dorise Bullion III M.D.   On: 10/31/2021 11:49    Labs:  CBC: Recent Labs    10/31/21 0349 11/01/21 1024 11/02/21 0619 11/03/21 0435   WBC 10.9* 8.2 6.3 7.0  HGB 10.8* 10.7* 11.1* 10.8*  HCT 33.8* 34.3* 35.2* 33.3*  PLT 170 156 165 196    COAGS: No results for input(s): INR, APTT in the last 8760 hours.  BMP: Recent Labs    10/31/21 0349 11/01/21 1024 11/02/21 0619 11/03/21 0435  NA 140 137 136 138  K 3.4* 3.9 3.9 3.5  CL 106 105 105 104  CO2 26 26 28 26   GLUCOSE 121* 152* 100* 103*  BUN 13 9 12 14   CALCIUM 8.4* 8.3* 8.3* 8.4*  CREATININE 0.78 0.78 0.75 0.70  GFRNONAA >60 >60 >60 >60    LIVER FUNCTION TESTS: Recent Labs    10/30/21 1902 10/31/21 0349 11/02/21 0619 11/03/21 0435  BILITOT 0.6 0.3 0.3 0.3  AST 16 12* 10* 10*  ALT 10 8 8 9   ALKPHOS 62 47 47 48  PROT 8.1 6.4* 6.2* 6.4*  ALBUMIN 3.5 2.7* 2.4* 2.5*    TUMOR MARKERS: No results for input(s): AFPTM, CEA, CA199, CHROMGRNA in the last 8760 hours.  Assessment and Plan:  Hx cholecystitis and percutaneous chole drain placed in IR 07/08/21 Capped for 1 mo and drain removed 09/22/21 in IR Now with new abd pain and low grade fever US revealing cholecystitis Not a surgical candidate per Dr Arnoldo Morale Scheduled now for replacement of perc cholecystostomy drain Risks and benefits discussed with the patient including, but not limited to bleeding, infection, gallbladder perforation, bile leak, sepsis or even death.  All of the patient's questions were answered, patient is agreeable to proceed. Consent signed and in chart.   Thank you for this interesting consult.  I greatly enjoyed meeting [facility] and look forward to participating in their care.  A copy of this report was sent to the requesting provider on this date.  Electronically Signed: Lavonia Drafts, PA-C 11/03/2021, 7:37 AM   I spent a total of 40 Minutes    in face to face in clinical consultation, greater than 50% of which was  counseling/coordinating care for percutaneous cholecystostomy drain placement

## 2021-11-03 NOTE — Plan of Care (Signed)
  Problem: Acute Rehab PT Goals(only PT should resolve) Goal: Patient Will Transfer Sit To/From Stand Outcome: Completed/Met Flowsheets (Taken 11/03/2021 1628) Patient will transfer sit to/from stand: with modified independence Goal: Pt Will Ambulate Outcome: Completed/Met Flowsheets (Taken 11/03/2021 1628) Pt will Ambulate:  > 125 feet  with modified independence   4:29 PM, 11/03/21 Jerene Pitch, DPT Physical Therapy with [facility]  [phone] office

## 2021-11-03 NOTE — Progress Notes (Signed)
Care link called and set up for patient to go to IR [facility] tomorrow 11/04/2021 by 09:00am. Consent and packet will be in patients chart.

## 2021-11-03 NOTE — Progress Notes (Signed)
PROGRESS NOTE  Natalie Castro NAT:[id number] DOB: 02-14-1941 DOA: 10/30/2021 PCP: [name], MD   LOS: 4 days   Brief Narrative / Interim history: 80 year old female with history of diastolic CHF, prior PE on Eliquis, DM2, CHB status post pacemaker, comes into the hospital with epigastric abdominal pain, nausea vomiting.  She was recently admitted for sepsis secondary to acute cholecystitis and E. coli bacteremia in July 2022 and had a percutaneous cholecystostomy at that time which was removed about 8 weeks later.  She presented again on 11/5 with similar symptoms, and imaging showed gallbladder wall thickening, pericholecystic fluid and sludge concerning for acute cholecystitis.  General surgery evaluated patient and she was considered very high risk for surgery, and IR has been consulted.  Plans are in place for patient to get a percutaneous cholecystostomy on 11/10  Subjective / 24h Interval events: She is doing well this morning, no longer has abdominal pain, no nausea or vomiting.  Assessment & Plan: Principal Problem Recurrent acute acalculous cholecystitis-appreciate surgery and IR follow-up.  Continue antibiotics for now with ceftriaxone and metronidazole, today's day #5.  She will get percutaneous cholecystostomy by IR on Thursday 11/10 once she is off Eliquis for at least couple of days.  Active Problems History of PE-hold Eliquis in anticipation of IR procedure  Chronic diastolic CHF, history of CHB with pacemaker-continue home regimen as below, this is currently stable.  Continue furosemide  Essential hypertension-continue hydralazine, losartan, metoprolol  Hyperlipidemia-continue statin  DM2-A1c 6.8, continue sliding scale  CBG (last 3)  Recent Labs    11/02/21 2100 11/03/21 0713 11/03/21 1124  GLUCAP 170* 104* 219*   Scheduled Meds:  atorvastatin  40 mg Oral QHS   furosemide  40 mg Oral Daily   hydrALAZINE  50 mg Oral TID   insulin aspart  0-15 Units  Subcutaneous TID WC   insulin aspart  0-5 Units Subcutaneous QHS   insulin glargine-yfgn  30 Units Subcutaneous QHS   losartan  100 mg Oral Daily   metoprolol succinate  50 mg Oral BID   NIFEdipine  30 mg Oral Daily   potassium chloride  10 mEq Oral Daily   Continuous Infusions:  cefTRIAXone (ROCEPHIN)  IV 2 g (11/02/21 2244)   metronidazole 500 mg (11/03/21 1007)   PRN Meds:.ondansetron **OR** ondansetron (ZOFRAN) IV, oxyCODONE  Diet Orders (From admission, onward)     Start     Ordered   11/04/21 0001  Diet NPO time specified Except for: Sips with Meds  Diet effective midnight       Comments: For possible IR procedure  Question:  Except for  Answer:  Ferrel Logan with Meds   11/03/21 0756   11/02/21 1024  Diet heart healthy/carb modified Room service appropriate? Yes; Fluid consistency: Thin  Diet effective now       Question Answer Comment  Diet-HS Snack? Nothing   Room service appropriate? Yes   Fluid consistency: Thin      11/02/21 1023            DVT prophylaxis:      Code Status: Full Code  Family Communication: No family at bedside  Status is: Inpatient  Remains inpatient appropriate because: Pending percutaneous cholecystostomy  Level of care: Med-Surg  Consultants:  General surgery IR  Procedures:  none  Microbiology  none  Antimicrobials: Ceftriaxone / Metronidazole, day #5    Objective: Vitals:   11/02/21 1426 11/02/21 2119 11/03/21 0556 11/03/21 0700  BP: (!) 132/56 (!) 129/52 (!) 124/48 130/64  Pulse: 62 63 60 (!) 59  Resp: 18 18 18 17   Temp: 98.6 F (37 C) 98.7 F (37.1 C) 98.7 F (37.1 C) 98.5 F (36.9 C)  TempSrc: Oral  Oral Oral  SpO2: 99% 98% 100% 98%  Weight:      Height:        Intake/Output Summary (Last 24 hours) at 11/03/2021 1155 Last data filed at 11/03/2021 0900 Gross per 24 hour  Intake 1200 ml  Output 1100 ml  Net 100 ml   Filed Weights   10/30/21 1827  Weight: 90.9 kg    Examination:  Constitutional:  NAD Eyes: no scleral icterus ENMT: Mucous membranes are moist.  Neck: normal, supple Respiratory: clear to auscultation bilaterally, no wheezing, no crackles. Normal respiratory effort.  Cardiovascular: Regular rate and rhythm, no murmurs / rubs / gallops. No LE edema.  Abdomen: non distended, no tenderness. Bowel sounds positive.  Musculoskeletal: no clubbing / cyanosis.  Skin: no rashes Neurologic: No focal deficits  Data Reviewed: I have independently reviewed following labs and imaging studies   CBC: Recent Labs  Lab 10/30/21 1902 10/31/21 0349 11/01/21 1024 11/02/21 0619 11/03/21 0435  WBC 8.8 10.9* 8.2 6.3 7.0  HGB 12.6 10.8* 10.7* 11.1* 10.8*  HCT 39.4 33.8* 34.3* 35.2* 33.3*  MCV 98.5 97.4 98.0 97.2 94.3  PLT 181 170 156 165 450   Basic Metabolic Panel: Recent Labs  Lab 10/30/21 1902 10/31/21 0349 11/01/21 1024 11/02/21 0619 11/03/21 0435  NA 141 140 137 136 138  K 3.6 3.4* 3.9 3.9 3.5  CL 105 106 105 105 104  CO2 24 26 26 28 26   GLUCOSE 183* 121* 152* 100* 103*  BUN 16 13 9 12 14   CREATININE 0.83 0.78 0.78 0.75 0.70  CALCIUM 9.4 8.4* 8.3* 8.3* 8.4*  MG  --   --  1.6* 1.7  --   PHOS  --   --   --  3.3  --    Liver Function Tests: Recent Labs  Lab 10/30/21 1902 10/31/21 0349 11/02/21 0619 11/03/21 0435  AST 16 12* 10* 10*  ALT 10 8 8 9   ALKPHOS 62 47 47 48  BILITOT 0.6 0.3 0.3 0.3  PROT 8.1 6.4* 6.2* 6.4*  ALBUMIN 3.5 2.7* 2.4* 2.5*   Coagulation Profile: Recent Labs  Lab 11/03/21 0827  INR 1.2   HbA1C: No results for input(s): HGBA1C in the last 72 hours. CBG: Recent Labs  Lab 11/02/21 1103 11/02/21 1638 11/02/21 2100 11/03/21 0713 11/03/21 1124  GLUCAP 166* 147* 170* 104* 219*    No results found for this or any previous visit (from the past 240 hour(s)).   Radiology Studies: No results found.  Marzetta Board, MD, PhD [facility]  Between 7 am - 7 pm I am available, please contact me via Amion (for emergencies) or  Securechat (non urgent messages)  Between 7 pm - 7 am I am not available, please contact night coverage MD/APP via Amion

## 2021-11-03 NOTE — Progress Notes (Signed)
Physical Therapy Treatment Patient Details Name: Natalie Castro MRN: [id number] DOB: 1941/01/18 Today's Date: 11/03/2021   History of Present Illness Natalie Castro is a 80 y.o. female presents with abdominal pain, nausea and vomitting; admitted with acute cholecystitis. Pt recently admitted for sepsis secondary to cholecystitis and E. coli bacteremia in July 2022, he had a drain placed in the gallbladder lumen, which successfully treated the patient's acute cholecystitis without surgery.  The Coley tube has since been removed. PMH: complete heart block with pacemaker, HTN, CHF, PE, diabetes    PT Comments    Patient sitting up in chair and agreeable to therapy. Performed seated exercises without difficulty.Able to transition from sit to stand with modified independence and walk in hallway 130 feet with RW and modified independence. Patient returned to sitting in chair end of session.Patient has met all goals at this time and no skilled PT required at this time. Patient discharged from physical therapy to nursing staff for ambulation as tolerated daily for length of stay.    Recommendations for follow up therapy are one component of a multi-disciplinary discharge planning process, led by the attending physician.  Recommendations may be updated based on patient status, additional functional criteria and insurance authorization.  Follow Up Recommendations  No PT follow up     Assistance Recommended at Discharge PRN  Equipment Recommendations  None recommended by PT    Recommendations for Other Services       Precautions / Restrictions Precautions Precautions: None     Mobility  Bed Mobility                    Transfers Overall transfer level: Modified independent Equipment used: Rolling walker (2 wheels) Transfers: Sit to/from Stand Sit to Stand: Modified independent (Device/Increase time)           General transfer comment: slight increase in time to transition from sit  to stand but no physical assist required    Ambulation/Gait Ambulation/Gait assistance: Modified independent (Device/Increase time) Gait Distance (Feet): 130 Feet Assistive device: Rolling walker (2 wheels) Gait Pattern/deviations: Step-through pattern;Decreased stride length;Trunk flexed;WFL(Within Functional Limits) Gait velocity: decreased     General Gait Details: pt ambulates with trunk flexed, step through pattern WFL, no LOB, clears past obstacles, navigates doors without difficulty   Stairs             Wheelchair Mobility    Modified Rankin (Stroke Patients Only)       Balance Overall balance assessment: No apparent balance deficits (not formally assessed)                                          Cognition Arousal/Alertness: Awake/alert Behavior During Therapy: WFL for tasks assessed/performed Overall Cognitive Status: Within Functional Limits for tasks assessed                                          Exercises General Exercises - Lower Extremity Ankle Circles/Pumps: AROM;Strengthening;Both;15 reps;Seated Long Arc Quad: AROM;Strengthening;Seated;Both;15 reps Hip Flexion/Marching: AROM;Strengthening;Both;15 reps;Seated    General Comments        Pertinent Vitals/Pain Pain Assessment: No/denies pain    Home Living  Prior Function            PT Goals (current goals can now be found in the care plan section) Acute Rehab PT Goals Patient Stated Goal: return home PT Goal Formulation: With patient/family Time For Goal Achievement: 11/16/21 Potential to Achieve Goals: Good Progress towards PT goals: Progressing toward goals    Frequency    Min 3X/week      PT Plan Current plan remains appropriate    Co-evaluation              AM-PAC PT "6 Clicks" Mobility   Outcome Measure  Help needed turning from your back to your side while in a flat bed without using  bedrails?: None Help needed moving from lying on your back to sitting on the side of a flat bed without using bedrails?: None Help needed moving to and from a bed to a chair (including a wheelchair)?: A Little Help needed standing up from a chair using your arms (e.g., wheelchair or bedside chair)?: None Help needed to walk in hospital room?: None Help needed climbing 3-5 steps with a railing? : A Little 6 Click Score: 22    End of Session Equipment Utilized During Treatment: Gait belt Activity Tolerance: Patient tolerated treatment well Patient left: in chair;with call bell/phone within reach;with family/visitor present Nurse Communication: Mobility status PT Visit Diagnosis: Other abnormalities of gait and mobility (R26.89)     Time: 7989-2119 PT Time Calculation (min) (ACUTE ONLY): 15 min  Charges:  $Therapeutic Activity: 8-22 mins                    4:32 PM, 11/03/21 Jerene Pitch, DPT Physical Therapy with [facility]  [phone] office

## 2021-11-03 NOTE — Progress Notes (Signed)
OT CScreen Note  Patient Details Name: SIANA PANAMENO MRN: [id number] DOB: 1941-11-28   Cancelled Treatment:    Reason Eval/Treat Not Completed: OT screened, no needs identified, will sign off. Patient functioning at baseline and has strong family support at home if needed to assist. No follow up OT services are needed at this time. Thank you for the referral.    Ailene Ravel, OTR/L,CBIS  [phone]  11/03/2021, 11:56 AM

## 2021-11-04 ENCOUNTER — Ambulatory Visit (HOSPITAL_COMMUNITY)
Admission: RE | Admit: 2021-11-04 | Discharge: 2021-11-04 | Disposition: A | Discharge status: Home / Self Care | Payer: Medicare Other | Source: Ambulatory Visit | Attending: Internal Medicine | Admitting: Internal Medicine

## 2021-11-04 DIAGNOSIS — K810 Acute cholecystitis: Secondary | ICD-10-CM | POA: Diagnosis not present

## 2021-11-04 DIAGNOSIS — I5032 Chronic diastolic (congestive) heart failure: Secondary | ICD-10-CM | POA: Diagnosis not present

## 2021-11-04 DIAGNOSIS — R1011 Right upper quadrant pain: Secondary | ICD-10-CM | POA: Diagnosis not present

## 2021-11-04 HISTORY — PX: IR PERC CHOLECYSTOSTOMY: IMG2326

## 2021-11-04 LAB — RESP PANEL BY RT-PCR (FLU A&B, COVID) ARPGX2
Influenza A by PCR: NEGATIVE
Influenza B by PCR: NEGATIVE
SARS Coronavirus 2 by RT PCR: NEGATIVE

## 2021-11-04 LAB — HEPATIC FUNCTION PANEL
ALT: 7 U/L (ref 0–44)
AST: 10 U/L — ABNORMAL LOW (ref 15–41)
Albumin: 2.6 g/dL — ABNORMAL LOW (ref 3.5–5.0)
Alkaline Phosphatase: 50 U/L (ref 38–126)
Bilirubin, Direct: <0.1 mg/dL (ref 0.0–0.2)
Total Bilirubin: 0.2 mg/dL — ABNORMAL LOW (ref 0.3–1.2)
Total Protein: 6.4 g/dL — ABNORMAL LOW (ref 6.5–8.1)

## 2021-11-04 LAB — CBC
HCT: 34.2 % — ABNORMAL LOW (ref 36.0–46.0)
Hemoglobin: 10.8 g/dL — ABNORMAL LOW (ref 12.0–15.0)
MCH: 30.3 pg (ref 26.0–34.0)
MCHC: 31.6 g/dL (ref 30.0–36.0)
MCV: 96.1 fL (ref 80.0–100.0)
Platelets: 203 10*3/uL (ref 150–400)
RBC: 3.56 MIL/uL — ABNORMAL LOW (ref 3.87–5.11)
RDW: 15.4 % (ref 11.5–15.5)
WBC: 6.4 10*3/uL (ref 4.0–10.5)
nRBC: 0 % (ref 0.0–0.2)

## 2021-11-04 LAB — BASIC METABOLIC PANEL
Anion gap: 8 (ref 5–15)
BUN: 18 mg/dL (ref 8–23)
CO2: 26 mmol/L (ref 22–32)
Calcium: 8.6 mg/dL — ABNORMAL LOW (ref 8.9–10.3)
Chloride: 106 mmol/L (ref 98–111)
Creatinine, Ser: 0.87 mg/dL (ref 0.44–1.00)
GFR, Estimated: >60 mL/min (ref >60)
Glucose, Bld: 63 mg/dL — ABNORMAL LOW (ref 70–99)
Potassium: 4 mmol/L (ref 3.5–5.1)
Sodium: 140 mmol/L (ref 135–145)

## 2021-11-04 LAB — GLUCOSE, CAPILLARY
Glucose-Capillary: 79 mg/dL (ref 70–99)
Glucose-Capillary: 84 mg/dL (ref 70–99)
Glucose-Capillary: 153 mg/dL — ABNORMAL HIGH (ref 70–99)
Glucose-Capillary: 166 mg/dL — ABNORMAL HIGH (ref 70–99)

## 2021-11-04 MED ORDER — FENTANYL CITRATE (PF) 100 MCG/2ML IJ SOLN
INTRAMUSCULAR | Status: AC | PRN
  Administered 2021-11-04: 50 ug via INTRAVENOUS

## 2021-11-04 MED ORDER — LIDOCAINE HCL (PF) 1 % IJ SOLN
INTRAMUSCULAR | Status: AC | PRN
  Administered 2021-11-04: 5 mL

## 2021-11-04 MED ORDER — MIDAZOLAM HCL 2 MG/2ML IJ SOLN
INTRAMUSCULAR | Status: AC
  Filled 2021-11-04: qty 4

## 2021-11-04 MED ORDER — MIDAZOLAM HCL 2 MG/2ML IJ SOLN
INTRAMUSCULAR | Status: AC | PRN
  Administered 2021-11-04: 1 mg via INTRAVENOUS

## 2021-11-04 MED ORDER — IOHEXOL 300 MG/ML  SOLN
100.0000 mL | Freq: Once | INTRAMUSCULAR | Status: AC | PRN
  Administered 2021-11-04: 20 mL

## 2021-11-04 MED ORDER — FENTANYL CITRATE (PF) 100 MCG/2ML IJ SOLN
INTRAMUSCULAR | Status: AC
  Filled 2021-11-04: qty 4

## 2021-11-04 MED ORDER — LIDOCAINE HCL 1 % IJ SOLN
INTRAMUSCULAR | Status: AC
  Filled 2021-11-04: qty 20

## 2021-11-04 NOTE — Progress Notes (Signed)
Inpatient Diabetes Program Recommendations  AACE/ADA: New Consensus Statement on Inpatient Glycemic Control (2015)  Target Ranges:  Prepandial:   less than 140 mg/dL      Peak postprandial:   less than 180 mg/dL (1-2 hours)      Critically ill patients:  140 - 180 mg/dL   Lab Results  Component Value Date   GLUCAP 79 11/04/2021   HGBA1C 6.8 (H) 10/30/2021    Review of Glycemic Control Results for Natalie Castro, Natalie Castro (MRN [id number]) as of 11/04/2021 09:51  Ref. Range 11/03/2021 07:13 11/03/2021 11:24 11/03/2021 16:26 11/03/2021 20:11 11/04/2021 08:02  Glucose-Capillary Latest Ref Range: 70 - 99 mg/dL 104 (H) 219 (H) 120 (H) 217 (H) 79    Diabetes history: DM 2 Outpatient Diabetes medications:  Current orders for Inpatient glycemic control:  Semglee 30 units qhs Novolog 0-15 units tid + hs  A1c 6.8% on 11/5  Inpatient Diabetes Program Recommendations:    -  Consider reducing Semglee to 28 units -  Add Novolog 2 units tid meal coverage if eating 50% of a meal   Thanks, Tama Headings RN, MSN, BC-ADM Inpatient Diabetes Coordinator Team Pager [phone] (8a-5p)

## 2021-11-04 NOTE — Sedation Documentation (Signed)
[facility] arrived to transport pt back to [facility]. SBAR given to [place], Therapist, sports.

## 2021-11-04 NOTE — Sedation Documentation (Signed)
Pt arrived from [facility] via [facility]. Pt is alert and oriented x4, npo, consent signed. Assisted pt to bathroom with one person assist and back to stretcher safely. Pt denies pain at this time. Vitals stable.

## 2021-11-04 NOTE — Procedures (Signed)
Pre procedural Dx: Recurrent acute cholecysitis Post procedural Dx: Same  Technically successful [place] and Fluoro guided placement of a 10 Fr drainage catheter placement into the gallbladder lumen. Chole tube connected to gravity bag.  EBL: None Complications: None immediate  PLAN:  - As this is the pt's second chole tube placement, it should be considered a permanent tube with next exchange in 6-8 weeks.   Ronny Bacon, MD Pager #: [phone]

## 2021-11-04 NOTE — Progress Notes (Signed)
PROGRESS NOTE  Natalie Castro JOA:[id number] DOB: December 20, 1941 DOA: 10/30/2021 PCP: [name], MD   LOS: 5 days   Brief Narrative / Interim history: 80 year old female with history of diastolic CHF, prior PE on Eliquis, DM2, CHB status post pacemaker, comes into the hospital with epigastric abdominal pain, nausea vomiting.  She was recently admitted for sepsis secondary to acute cholecystitis and E. coli bacteremia in July 2022 and had a percutaneous cholecystostomy at that time which was removed about 8 weeks later.  She presented again on 11/5 with similar symptoms, and imaging showed gallbladder wall thickening, pericholecystic fluid and sludge concerning for acute cholecystitis.  General surgery evaluated patient and she was considered very high risk for surgery, and IR has been consulted.  Plans are in place for patient to get a percutaneous cholecystostomy on 11/10  Subjective / 24h Interval events: She is doing well this morning, no longer has abdominal pain, no nausea or vomiting.  Doing well, no abdominal pain, no nausea or vomiting  Assessment & Plan: Principal Problem Recurrent acute acalculous cholecystitis-appreciate surgery and IR follow-up.  Continue antibiotics for now with ceftriaxone and metronidazole, today's day #6.  She will get percutaneous cholecystostomy by IR today.  This will likely be permanent given the fact that she had a previous percutaneous cholecystostomy tube earlier this year that was removed after 8 weeks  Active Problems History of PE-hold Eliquis in anticipation of IR procedure, resume tomorrow  Chronic diastolic CHF, history of CHB with pacemaker-continue home regimen as below, this is currently stable.  Continue furosemide  Essential hypertension-continue hydralazine, losartan, metoprolol  Hyperlipidemia-continue statin  DM2-A1c 6.8, continue sliding scale  CBG (last 3)  Recent Labs    11/03/21 1626 11/03/21 2011 11/04/21 0802  GLUCAP  120* 217* 79    Scheduled Meds:  atorvastatin  40 mg Oral QHS   furosemide  40 mg Oral Daily   hydrALAZINE  50 mg Oral TID   insulin aspart  0-15 Units Subcutaneous TID WC   insulin aspart  0-5 Units Subcutaneous QHS   insulin glargine-yfgn  30 Units Subcutaneous QHS   losartan  100 mg Oral Daily   metoprolol succinate  50 mg Oral BID   NIFEdipine  30 mg Oral Daily   potassium chloride  10 mEq Oral Daily   Continuous Infusions:  cefTRIAXone (ROCEPHIN)  IV 2 g (11/03/21 2224)   metronidazole 500 mg (11/03/21 2055)   PRN Meds:.ondansetron **OR** ondansetron (ZOFRAN) IV, oxyCODONE  Diet Orders (From admission, onward)     Start     Ordered   11/04/21 0001  Diet NPO time specified Except for: Sips with Meds  Diet effective midnight       Comments: For possible IR procedure  Question:  Except for  Answer:  Ferrel Logan with Meds   11/03/21 0756            DVT prophylaxis:      Code Status: Full Code  Family Communication: No family at bedside  Status is: Inpatient  Remains inpatient appropriate because: Pending percutaneous cholecystostomy  Level of care: Med-Surg  Consultants:  General surgery IR  Procedures:  none  Microbiology  none  Antimicrobials: Ceftriaxone / Metronidazole, day #5    Objective: Vitals:   11/03/21 0700 11/03/21 1400 11/03/21 2009 11/04/21 0520  BP: 130/64 (!) 124/52 (!) 116/57 (!) 136/51  Pulse: (!) 59 60 65 60  Resp: 17 18 20 15   Temp: 98.5 F (36.9 C) 97.9 F (36.6 C) 98.2 F (  36.8 C) 98.3 F (36.8 C)  TempSrc: Oral Oral Oral Oral  SpO2: 98% 100% 99% 98%  Weight:      Height:        Intake/Output Summary (Last 24 hours) at 11/04/2021 1113 Last data filed at 11/04/2021 0548 Gross per 24 hour  Intake 720 ml  Output 451 ml  Net 269 ml    Filed Weights   10/30/21 1827  Weight: 90.9 kg    Examination:  Constitutional: NAD Eyes: Anicteric ENMT: mmm Neck: normal, supple Respiratory: CTA bilaterally Cardiovascular:  Regular rate and rhythm, no murmurs, no edema Abdomen: Soft, NT, ND, bowel sounds positive Musculoskeletal: no clubbing / cyanosis.  Skin: No rashes seen Neurologic: Nonfocal, equal strength  Data Reviewed: I have independently reviewed following labs and imaging studies   CBC: Recent Labs  Lab 10/31/21 0349 11/01/21 1024 11/02/21 0619 11/03/21 0435 11/04/21 0437  WBC 10.9* 8.2 6.3 7.0 6.4  HGB 10.8* 10.7* 11.1* 10.8* 10.8*  HCT 33.8* 34.3* 35.2* 33.3* 34.2*  MCV 97.4 98.0 97.2 94.3 96.1  PLT 170 156 165 196 124    Basic Metabolic Panel: Recent Labs  Lab 10/31/21 0349 11/01/21 1024 11/02/21 0619 11/03/21 0435 11/04/21 0437  NA 140 137 136 138 140  K 3.4* 3.9 3.9 3.5 4.0  CL 106 105 105 104 106  CO2 26 26 28 26 26   GLUCOSE 121* 152* 100* 103* 63*  BUN 13 9 12 14 18   CREATININE 0.78 0.78 0.75 0.70 0.87  CALCIUM 8.4* 8.3* 8.3* 8.4* 8.6*  MG  --  1.6* 1.7  --   --   PHOS  --   --  3.3  --   --     Liver Function Tests: Recent Labs  Lab 10/30/21 1902 10/31/21 0349 11/02/21 0619 11/03/21 0435 11/04/21 0437  AST 16 12* 10* 10* 10*  ALT 10 8 8 9 7   ALKPHOS 62 47 47 48 50  BILITOT 0.6 0.3 0.3 0.3 0.2*  PROT 8.1 6.4* 6.2* 6.4* 6.4*  ALBUMIN 3.5 2.7* 2.4* 2.5* 2.6*    Coagulation Profile: Recent Labs  Lab 11/03/21 0827  INR 1.2    HbA1C: No results for input(s): HGBA1C in the last 72 hours. CBG: Recent Labs  Lab 11/03/21 0713 11/03/21 1124 11/03/21 1626 11/03/21 2011 11/04/21 0802  GLUCAP 104* 219* 120* 217* 79     Recent Results (from the past 240 hour(s))  Resp Panel by RT-PCR (Flu A&B, Covid) Nasopharyngeal Swab     Status: None   Collection Time: 11/04/21  7:33 AM   Specimen: Nasopharyngeal Swab; Nasopharyngeal(NP) swabs in vial transport medium  Result Value Ref Range Status   SARS Coronavirus 2 by RT PCR NEGATIVE NEGATIVE Final    Comment: (NOTE) SARS-CoV-2 target nucleic acids are NOT DETECTED.  The SARS-CoV-2 RNA is generally  detectable in upper respiratory specimens during the acute phase of infection. The lowest concentration of SARS-CoV-2 viral copies this assay can detect is 138 copies/mL. A negative result does not preclude SARS-Cov-2 infection and should not be used as the sole basis for treatment or other patient management decisions. A negative result may occur with  improper specimen collection/handling, submission of specimen other than nasopharyngeal swab, presence of viral mutation(s) within the areas targeted by this assay, and inadequate number of viral copies(<138 copies/mL). A negative result must be combined with clinical observations, patient history, and epidemiological information. The expected result is Negative.  Fact Sheet for Patients:  EntrepreneurPulse.com.au  Fact Sheet for  Healthcare Providers:  IncredibleEmployment.be  This test is no t yet approved or cleared by the [place] and  has been authorized for detection and/or diagnosis of SARS-CoV-2 by FDA under an Emergency Use Authorization (EUA). This EUA will remain  in effect (meaning this test can be used) for the duration of the COVID-19 declaration under Section 564(b)(1) of the Act, 21 U.S.C.section 360bbb-3(b)(1), unless the authorization is terminated  or revoked sooner.       Influenza A by PCR NEGATIVE NEGATIVE Final   Influenza B by PCR NEGATIVE NEGATIVE Final    Comment: (NOTE) The Xpert Xpress SARS-CoV-2/FLU/RSV plus assay is intended as an aid in the diagnosis of influenza from Nasopharyngeal swab specimens and should not be used as a sole basis for treatment. Nasal washings and aspirates are unacceptable for Xpert Xpress SARS-CoV-2/FLU/RSV testing.  Fact Sheet for Patients: EntrepreneurPulse.com.au  Fact Sheet for Healthcare Providers: IncredibleEmployment.be  This test is not yet approved or cleared by the [place] FDA  and has been authorized for detection and/or diagnosis of SARS-CoV-2 by FDA under an Emergency Use Authorization (EUA). This EUA will remain in effect (meaning this test can be used) for the duration of the COVID-19 declaration under Section 564(b)(1) of the Act, 21 U.S.C. section 360bbb-3(b)(1), unless the authorization is terminated or revoked.  Performed at [facility], [address] [place], [place] [address]      Radiology Studies: No results found.  Marzetta Board, MD, PhD [facility]  Between 7 am - 7 pm I am available, please contact me via Amion (for emergencies) or Securechat (non urgent messages)  Between 7 pm - 7 am I am not available, please contact night coverage MD/APP via Amion

## 2021-11-04 NOTE — Sedation Documentation (Signed)
[facility] called for transport back to [facility].

## 2021-11-05 DIAGNOSIS — R1011 Right upper quadrant pain: Secondary | ICD-10-CM | POA: Diagnosis not present

## 2021-11-05 DIAGNOSIS — K810 Acute cholecystitis: Secondary | ICD-10-CM | POA: Diagnosis not present

## 2021-11-05 LAB — GLUCOSE, CAPILLARY: Glucose-Capillary: 127 mg/dL — ABNORMAL HIGH (ref 70–99)

## 2021-11-05 MED ORDER — CIPROFLOXACIN HCL 500 MG PO TABS: 500.0000 mg | ORAL_TABLET | Freq: Two times a day (BID) | ORAL | 0 refills | Status: AC

## 2021-11-05 NOTE — Discharge Summary (Signed)
Physician Discharge Summary  Natalie Castro VEH:[id number] DOB: 05/11/1941 DOA: 10/30/2021  PCP: Natalie Han, MD  Admit date: 10/30/2021 Discharge date: 11/05/2021  Admitted From: home Disposition:  home  Recommendations for Outpatient Follow-up:  Follow up with PCP in 1-2 weeks Follow up with IR as scheduled  Home Health: PT, RN Equipment/Devices: none  Discharge Condition: stable CODE STATUS: Full code Diet recommendation: regular  HPI: Per admitting MD, Natalie Castro is a 80 y.o. 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  Patient has a recent admission for sepsis secondary to cholecystitis and E. coli bacteremia in July 2022.  She had a drain placed in the gallbladder lumen by interventional radiology, which successfully treated the patient's acute cholecystitis without surgery.  The Coley tube has since been removed.  The patient presents today with increasing abdominal pain in the right upper quadrant that started around 1 PM.  Her pain has been slightly worsening.  Denies fevers, chills, nausea, vomiting.  No palliating or provoking factors.  Hospital Course / Discharge diagnoses: Principal Problem Recurrent acute acalculous cholecystitis-patient was admitted to the hospital with an episode of biliary colic.  General surgery consulted, IR, and eventually patient underwent a percutaneous cholecystostomy on 11/10.  She was maintained on antibiotics while hospitalized, will be transitioned to Cipro for 2 additional days upon discharge.  Her symptoms have resolved completely, she is no longer nauseous, tolerating a regular diet and will be discharged home in stable condition with outpatient follow-up with IR as well as general surgery.  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 Chronic diastolic CHF, history of CHB with pacemaker-continue home regimen Essential hypertension-continue home regimen Hyperlipidemia-continue statin DM2-A1c 6.8  Sepsis ruled out   Discharge Instructions   Allergies as of 11/05/2021       Reactions   Penicillins Hives, Itching, Other (See Comments)   Has tolerated ceftriaxone 7/22 Has patient had a PCN reaction causing immediate rash, facial/tongue/throat swelling, SOB or lightheadedness with hypotension: No Has patient had a PCN reaction causing severe rash involving mucus membranes or skin necrosis: Yes Has patient had a PCN reaction that required hospitalization No Has patient had a PCN reaction occurring within the last 10 years: No If all of the above answers are "NO", then may proceed with Cephalosporin use.        Medication List     STOP taking these medications    cefadroxil 500 MG capsule Commonly known as: DURICEF       TAKE these medications    acetaminophen 500 MG tablet Commonly known as: TYLENOL Take 1,000 mg by mouth every 6 (six) hours as needed.   atorvastatin 40 MG tablet Commonly known as: LIPITOR TAKE (1) TABLET BY MOUTH AT BEDTIME. What changed: See the new instructions.   ciprofloxacin 500 MG tablet Commonly known as: Cipro Take 1 tablet (500 mg total) by mouth 2 (two) times daily for 2 days.   Eliquis 5 MG Tabs tablet Generic drug: apixaban TAKE 1 TABLET BY MOUTH TWICE A DAY.   furosemide 40 MG tablet Commonly known as: LASIX Take 1 tablet (40 mg total) by mouth daily.   glucose blood test strip Use to check blood glucose fasting , before lunch and dinner and after your largest meal   guaiFENesin 600 MG 12 hr tablet  Commonly known as: MUCINEX Take 1 tablet (600 mg total) by mouth 2 (two) times daily as needed for cough.   hydrALAZINE 50 MG tablet Commonly known as: APRESOLINE Take 1 tablet (50 mg total) by mouth 2 (two) times daily. What changed:  when to take this   Lantus SoloStar 100 UNIT/ML Solostar Pen Generic drug: insulin glargine ADMINISTER 30 UNITS UNDER THE SKIN AT BEDTIME What changed: See the new instructions.   losartan 100 MG tablet Commonly known as: COZAAR TAKE (1) TABLET BY MOUTH ONCE DAILY. What changed: See the new instructions.   metFORMIN 1000 MG tablet Commonly known as: GLUCOPHAGE TAKE (1) TABLET BY MOUTH TWICE DAILY.   metoprolol succinate 50 MG 24 hr tablet Commonly known as: TOPROL-XL Take 1 tablet (50 mg total) by mouth 2 (two) times daily. Take with or immediately following a meal.   NIFEdipine 30 MG 24 hr tablet Commonly known as: PROCARDIA-XL/NIFEDICAL-XL Take 30 mg by mouth daily.   nitroGLYCERIN 0.4 MG SL tablet Commonly known as: NITROSTAT Place 1 tablet (0.4 mg total) under the tongue every 5 (five) minutes as needed for chest pain.   onetouch ultrasoft lancets Use to check glucose 4 x daily   polyethylene glycol 17 g packet Commonly known as: MIRALAX / GLYCOLAX Take 17 g by mouth daily as needed for mild constipation.   potassium chloride 10 MEQ tablet Commonly known as: KLOR-CON Take 1 tablet (10 mEq total) by mouth daily.         Consultations: General surgery  IR  Procedures/Studies: Percutaneous cholecystostomy 11/10  CT ABDOMEN PELVIS W CONTRAST  Result Date: 10/30/2021 CLINICAL DATA:  Acute abdominal pain EXAM: CT ABDOMEN AND PELVIS WITH CONTRAST TECHNIQUE: Multidetector CT imaging of the abdomen and pelvis was performed using the standard protocol following bolus administration of intravenous contrast. CONTRAST:  135mL OMNIPAQUE IOHEXOL 300 MG/ML  SOLN COMPARISON:  08/22/2021 FINDINGS: Lower chest: No acute abnormality. Hepatobiliary: Scattered hypodensities are noted throughout the liver similar to that seen on prior exam consistent with cysts. The previously seen cholecystostomy tube has been removed in the interval. The gallbladder is partially distended with  considerable wall thickening identified. Pericholecystic inflammatory changes are noted as well. These findings are highly suspicious for recurrent acute cholecystitis. Ultrasound may be helpful for further evaluation. Pancreas: Unremarkable. No pancreatic ductal dilatation or surrounding inflammatory changes. Spleen: Normal in size without focal abnormality. Adrenals/Urinary Tract: Adrenal glands are within normal limits. Kidneys demonstrate a normal enhancement pattern bilaterally. Normal excretion of contrast is seen bilaterally. No renal calculi or obstructive changes are seen. The bladder is decompressed. Stomach/Bowel: The appendix is well visualized and within normal limits. No obstructive or inflammatory changes of the colon are seen. Small bowel is unremarkable. The stomach is within normal limits. Vascular/Lymphatic: Aortic atherosclerosis. No enlarged abdominal or pelvic lymph nodes. Reproductive: Uterus and bilateral adnexa are unremarkable. Other: No abdominal wall hernia or abnormality. No abdominopelvic ascites. Musculoskeletal: No acute or significant osseous findings. IMPRESSION: Wall thickening and pericholecystic inflammatory changes fluid suggestive of recurrent acute cholecystitis. Ultrasound may be helpful for further evaluation. Stable hepatic cysts. No other acute abnormality is noted. Electronically Signed   By: Inez Catalina M.D.   On: 10/30/2021 20:58   IR Perc Cholecystostomy  Result Date: 11/04/2021 INDICATION: History of acute cholecystitis, initially post image guided cholecystostomy tube placement on 06/28/2021. Patient was subsequently evaluated by the surgical service and deemed a poor operative candidate. Patient tolerated a one-month capping trial of the cholecystostomy tube without incident  and as such, the cholecystostomy tube was removed on 09/22/2021. Unfortunately the patient returned to the [facility] emergency department with recurrent right upper abdominal quadrant  pain with abdominal CT demonstrating findings worrisome for acute on chronic cholecystitis. As such, patient presents today for repeat cholecystostomy tube placement. Note, as this is the patient's second primary placement of a cholecystostomy tube, it will now be considered a permanent tube requiring exchanges every 6-8 weeks. EXAM: ULTRASOUND AND FLUOROSCOPIC-GUIDED CHOLECYSTOSTOMY TUBE PLACEMENT COMPARISON:  CT abdomen pelvis-10/30/2021 Right upper quadrant abdominal ultrasound-10/31/2021 Cholangiogram was cholecystostomy tube removal-09/22/2021 MEDICATIONS: The patient is currently admitted to the hospital and on intravenous antibiotics. Antibiotics were administered within an appropriate time frame prior to skin puncture. ANESTHESIA/SEDATION: Moderate (conscious) sedation was employed during this procedure as administered by the Interventional Radiology RN. A total of Versed 1 mg and Fentanyl 25 mcg was administered intravenously. Moderate Sedation Time: 15 minutes. The patient's level of consciousness and vital signs were monitored continuously by radiology nursing throughout the procedure under my direct supervision. CONTRAST:  49mL OMNIPAQUE IOHEXOL 300 MG/ML SOLN - administered into the gallbladder lumen. FLUOROSCOPY TIME:  54 seconds (28 mGy) COMPLICATIONS: None immediate. PROCEDURE: Informed written consent was obtained from the patient after a discussion of the risks, benefits and alternatives to treatment. Questions regarding the procedure were encouraged and answered. A timeout was performed prior to the initiation of the procedure. The right upper abdominal quadrant was prepped and draped in the usual sterile fashion, and a sterile drape was applied covering the operative field. Maximum barrier sterile technique with sterile gowns and gloves were used for the procedure. A timeout was performed prior to the initiation of the procedure. Local anesthesia was provided with 1% lidocaine with epinephrine.  Ultrasound scanning of the right upper quadrant demonstrates a dilated and thick-walled gallbladder. Utilizing a transhepatic approach, a 22 gauge needle was advanced into the gallbladder under direct ultrasound guidance. An ultrasound image was saved for documentation purposes. Appropriate intraluminal puncture was confirmed with the efflux of bile and advancement of an 0.018 wire into the gallbladder lumen. The needle was exchanged for an [place] set. A small amount of contrast was injected to confirm appropriate intraluminal positioning. Over a Benson wire, a 57.2-French Cook cholecystomy tube was advanced into the gallbladder fossa, coiled and locked. Bile was aspirated and a small amount of contrast was injected as several post procedural spot radiographic images were obtained in various obliquities. The catheter was secured to the skin with suture, connected to a drainage bag and a dressing was placed. The patient tolerated the procedure well without immediate post procedural complication. IMPRESSION: Successful ultrasound and fluoroscopic guided placement of a 10.2 French cholecystostomy tube. PLAN: As above, as this is the patient's second primary placement of a cholecystostomy tube, it will now be considered a permanent tube requiring exchanges every 6-8 weeks. Electronically Signed   By: Sandi Mariscal M.D.   On: 11/04/2021 12:58   US Abdomen Limited RUQ (LIVER/GB)  Result Date: 10/31/2021 CLINICAL DATA:  Suspected acute cholecystitis on CT scan from October 30, 2021 EXAM: ULTRASOUND ABDOMEN LIMITED RIGHT UPPER QUADRANT COMPARISON:  None. FINDINGS: Gallbladder: Gallbladder wall thickening to 6.1 mm is identified. There is sludge in the gallbladder. A small amount of pericholecystic fluid is identified. No Murphy's sign reported. Common bile duct: Diameter: 5.7 mm Liver: 2.5 cm cyst in the right hepatic lobe. No other abnormalities. Portal vein is patent on color Doppler imaging with normal direction of  blood flow towards the  liver. Other: None. IMPRESSION: 1. Gallbladder wall thickening, pericholecystic fluid, and sludge. While a Murphy's sign was not reported, the findings on yesterday's CT scan are highly suggestive of acute cholecystitis. 2. 2.5 cm cyst in the right hepatic lobe. 3. No other abnormalities. Electronically Signed   By: Dorise Bullion III M.D.   On: 10/31/2021 11:49     Subjective: - no chest pain, shortness of breath, no abdominal pain, nausea or vomiting.   Discharge Exam: BP (!) 125/53   Pulse 62   Temp 98.2 F (36.8 C)   Resp 18   Ht 5\' 5"  (1.651 m)   Wt 90.9 kg   SpO2 93%   BMI 33.35 kg/m   General: Pt is alert, awake, not in acute distress Cardiovascular: RRR, S1/S2 +, no rubs, no gallops Respiratory: CTA bilaterally, no wheezing, no rhonchi Abdominal: Soft, NT, ND, bowel sounds + Extremities: no edema, no cyanosis   The results of significant diagnostics from this hospitalization (including imaging, microbiology, ancillary and laboratory) are listed below for reference.     Microbiology: Recent Results (from the past 240 hour(s))  Resp Panel by RT-PCR (Flu A&B, Covid) Nasopharyngeal Swab     Status: None   Collection Time: 11/04/21  7:33 AM   Specimen: Nasopharyngeal Swab; Nasopharyngeal(NP) swabs in vial transport medium  Result Value Ref Range Status   SARS Coronavirus 2 by RT PCR NEGATIVE NEGATIVE Final    Comment: (NOTE) SARS-CoV-2 target nucleic acids are NOT DETECTED.  The SARS-CoV-2 RNA is generally detectable in upper respiratory specimens during the acute phase of infection. The lowest concentration of SARS-CoV-2 viral copies this assay can detect is 138 copies/mL. A negative result does not preclude SARS-Cov-2 infection and should not be used as the sole basis for treatment or other patient management decisions. A negative result may occur with  improper specimen collection/handling, submission of specimen other than nasopharyngeal  swab, presence of viral mutation(s) within the areas targeted by this assay, and inadequate number of viral copies(<138 copies/mL). A negative result must be combined with clinical observations, patient history, and epidemiological information. The expected result is Negative.  Fact Sheet for Patients:  EntrepreneurPulse.com.au  Fact Sheet for Healthcare Providers:  IncredibleEmployment.be  This test is no t yet approved or cleared by the [place] FDA and  has been authorized for detection and/or diagnosis of SARS-CoV-2 by FDA under an Emergency Use Authorization (EUA). This EUA will remain  in effect (meaning this test can be used) for the duration of the COVID-19 declaration under Section 564(b)(1) of the Act, 21 U.S.C.section 360bbb-3(b)(1), unless the authorization is terminated  or revoked sooner.       Influenza A by PCR NEGATIVE NEGATIVE Final   Influenza B by PCR NEGATIVE NEGATIVE Final    Comment: (NOTE) The Xpert Xpress SARS-CoV-2/FLU/RSV plus assay is intended as an aid in the diagnosis of influenza from Nasopharyngeal swab specimens and should not be used as a sole basis for treatment. Nasal washings and aspirates are unacceptable for Xpert Xpress SARS-CoV-2/FLU/RSV testing.  Fact Sheet for Patients: EntrepreneurPulse.com.au  Fact Sheet for Healthcare Providers: IncredibleEmployment.be  This test is not yet approved or cleared by the [place] FDA and has been authorized for detection and/or diagnosis of SARS-CoV-2 by FDA under an Emergency Use Authorization (EUA). This EUA will remain in effect (meaning this test can be used) for the duration of the COVID-19 declaration under Section 564(b)(1) of the Act, 21 U.S.C. section 360bbb-3(b)(1), unless the authorization is terminated  or revoked.  Performed at [facility], [address], [place], [place] [address]      Labs: Basic  Metabolic Panel: Recent Labs  Lab 10/31/21 0349 11/01/21 1024 11/02/21 0619 11/03/21 0435 11/04/21 0437  NA 140 137 136 138 140  K 3.4* 3.9 3.9 3.5 4.0  CL 106 105 105 104 106  CO2 26 26 28 26 26   GLUCOSE 121* 152* 100* 103* 63*  BUN 13 9 12 14 18   CREATININE 0.78 0.78 0.75 0.70 0.87  CALCIUM 8.4* 8.3* 8.3* 8.4* 8.6*  MG  --  1.6* 1.7  --   --   PHOS  --   --  3.3  --   --    Liver Function Tests: Recent Labs  Lab 10/30/21 1902 10/31/21 0349 11/02/21 0619 11/03/21 0435 11/04/21 0437  AST 16 12* 10* 10* 10*  ALT 10 8 8 9 7   ALKPHOS 62 47 47 48 50  BILITOT 0.6 0.3 0.3 0.3 0.2*  PROT 8.1 6.4* 6.2* 6.4* 6.4*  ALBUMIN 3.5 2.7* 2.4* 2.5* 2.6*   CBC: Recent Labs  Lab 10/31/21 0349 11/01/21 1024 11/02/21 0619 11/03/21 0435 11/04/21 0437  WBC 10.9* 8.2 6.3 7.0 6.4  HGB 10.8* 10.7* 11.1* 10.8* 10.8*  HCT 33.8* 34.3* 35.2* 33.3* 34.2*  MCV 97.4 98.0 97.2 94.3 96.1  PLT 170 156 165 196 203   CBG: Recent Labs  Lab 11/04/21 0802 11/04/21 1252 11/04/21 1620 11/04/21 2110 11/05/21 0742  GLUCAP 79 84 153* 166* 127*   Hgb A1c No results for input(s): HGBA1C in the last 72 hours. Lipid Profile No results for input(s): CHOL, HDL, LDLCALC, TRIG, CHOLHDL, LDLDIRECT in the last 72 hours. Thyroid function studies No results for input(s): TSH, T4TOTAL, T3FREE, THYROIDAB in the last 72 hours.  Invalid input(s): FREET3 Urinalysis    Component Value Date/Time   COLORURINE YELLOW 10/30/2021 1954   APPEARANCEUR CLEAR 10/30/2021 1954   LABSPEC 1.011 10/30/2021 1954   PHURINE 5.0 10/30/2021 1954   GLUCOSEU NEGATIVE 10/30/2021 1954   HGBUR SMALL (A) 10/30/2021 [place] NEGATIVE 10/30/2021 [place] NEGATIVE 10/30/2021 1954   PROTEINUR NEGATIVE 10/30/2021 1954   UROBILINOGEN 0.2 06/18/2015 2300   NITRITE NEGATIVE 10/30/2021 1954   LEUKOCYTESUR NEGATIVE 10/30/2021 1954    FURTHER DISCHARGE INSTRUCTIONS:   Get Medicines reviewed and adjusted: Please  take all your medications with you for your next visit with your Primary MD   Laboratory/radiological data: Please request your Primary MD to go over all hospital tests and procedure/radiological results at the follow up, please ask your Primary MD to get all Hospital records sent to his/her office.   In some cases, they will be blood work, cultures and biopsy results pending at the time of your discharge. Please request that your primary care M.D. goes through all the records of your hospital data and follows up on these results.   Also Note the following: If you experience worsening of your admission symptoms, develop shortness of breath, life threatening emergency, suicidal or homicidal thoughts you must seek medical attention immediately by calling 911 or calling your MD immediately  if symptoms less severe.   You must read complete instructions/literature along with all the possible adverse reactions/side effects for all the Medicines you take and that have been prescribed to you. Take any new Medicines after you have completely understood and accpet all the possible adverse reactions/side effects.    Do not drive when taking Pain medications or sleeping medications (  Benzodaizepines)   Do not take more than prescribed Pain, Sleep and Anxiety Medications. It is not advisable to combine anxiety,sleep and pain medications without talking with your primary care practitioner   Special Instructions: If you have smoked or chewed Tobacco  in the last 2 yrs please stop smoking, stop any regular Alcohol  and or any Recreational drug use.   Wear Seat belts while driving.   Please note: You were cared for by a hospitalist during your hospital stay. Once you are discharged, your primary care physician will handle any further medical issues. Please note that NO REFILLS for any discharge medications will be authorized once you are discharged, as it is imperative that you return to your primary care  physician (or establish a relationship with a primary care physician if you do not have one) for your post hospital discharge needs so that they can reassess your need for medications and monitor your lab values.  Time coordinating discharge: 40 minutes  SIGNED:  Marzetta Board, MD, PhD 11/05/2021, 8:28 AM

## 2021-11-17 ENCOUNTER — Ambulatory Visit (INDEPENDENT_AMBULATORY_CARE_PROVIDER_SITE_OTHER): Payer: Medicare Other

## 2021-11-17 DIAGNOSIS — I442 Atrioventricular block, complete: Secondary | ICD-10-CM | POA: Diagnosis not present

## 2021-11-17 LAB — CUP PACEART REMOTE DEVICE CHECK
Battery Remaining Longevity: 58 mo
Battery Remaining Percentage: 49 %
Battery Voltage: 2.99 V
Brady Statistic AP VP Percent: 10 %
Brady Statistic AP VS Percent: 27 %
Brady Statistic AS VP Percent: 12 %
Brady Statistic AS VS Percent: 50 %
Brady Statistic RA Percent Paced: 37 %
Brady Statistic RV Percent Paced: 23 %
Date Time Interrogation Session: 20221123020012
Implantable Lead Implant Date: 20161129
Implantable Lead Implant Date: 20161129
Implantable Lead Location: 753859
Implantable Lead Location: 753860
Implantable Lead Model: 5076
Implantable Pulse Generator Implant Date: 20161129
Lead Channel Impedance Value: 340 Ohm
Lead Channel Impedance Value: 430 Ohm
Lead Channel Pacing Threshold Amplitude: 0.75 V
Lead Channel Pacing Threshold Amplitude: 0.875 V
Lead Channel Pacing Threshold Pulse Width: 0.4 ms
Lead Channel Pacing Threshold Pulse Width: 0.4 ms
Lead Channel Sensing Intrinsic Amplitude: 3.1 mV
Lead Channel Sensing Intrinsic Amplitude: 7.6 mV
Lead Channel Setting Pacing Amplitude: 1.875
Lead Channel Setting Pacing Amplitude: 2 V
Lead Channel Setting Pacing Pulse Width: 0.4 ms
Lead Channel Setting Sensing Sensitivity: 2 mV
Pulse Gen Model: 2240
Pulse Gen Serial Number: 7826037

## 2021-11-29 NOTE — Progress Notes (Signed)
Remote pacemaker transmission.   

## 2021-12-06 ENCOUNTER — Telehealth: Payer: Self-pay | Admitting: *Deleted

## 2021-12-06 NOTE — Telephone Encounter (Signed)
Received call from patient daughter Natalie Castro [phone] telephone.   Surgical Date:11/04/2021 Procedure: Percutaneous Cholecystostomy Drain placement  Concerns: Patient daughter reports that she was released from hospital with no discharge instructions on Percutaneous Cholecystostomy Drain. States that she also did not receive order for flushes. Reports that she has been using leftover flushes from previous drain, but has recently run out. States that she is unable to obtain flushes as they are currently on back order.   Advised to stop by office to obtain a few flushes. Advised that she can pick up 545mL- 159mL irrigation fluid at [facility] over the counter to use as flushes.   Patient has F/U appt on 12/21/2021.

## 2021-12-21 ENCOUNTER — Encounter: Payer: Self-pay | Admitting: General Surgery

## 2021-12-21 ENCOUNTER — Ambulatory Visit (INDEPENDENT_AMBULATORY_CARE_PROVIDER_SITE_OTHER): Payer: Medicare Other | Admitting: General Surgery

## 2021-12-21 ENCOUNTER — Other Ambulatory Visit: Payer: Self-pay

## 2021-12-21 VITALS — BP 126/72 | HR 67 | Temp 98.5°F | Resp 16 | Ht 65.0 in | Wt 199.0 lb

## 2021-12-21 DIAGNOSIS — Z8719 Personal history of other diseases of the digestive system: Secondary | ICD-10-CM

## 2021-12-21 NOTE — Progress Notes (Signed)
Subjective:     Natalie Castro  Patient presents here for recommendations concerning removal of her cholecystostomy tube.  She has not had any biliary colic symptoms.  She would like the tube removed. Objective:    BP 126/72    Pulse 67    Temp 98.5 F (36.9 C) (Oral)    Resp 16    Ht 5\' 5"  (1.651 m)    Wt 199 lb (90.3 kg)    SpO2 95%    BMI 33.12 kg/m   General:  alert, cooperative, and no distress  Abdomen soft, nontender, nondistended.  Cholecystostomy tube in place in the right upper quadrant.  Bilious drainage noted in tubing.     Assessment:    Status post cholecystostomy tube placement for history of cholecystitis    Plan:   I have clamped the tube to see whether or not she still needs it.  She will return in 1 week for follow-up.  She understands and agrees to the treatment plan.

## 2021-12-28 ENCOUNTER — Other Ambulatory Visit: Payer: Self-pay

## 2021-12-28 ENCOUNTER — Ambulatory Visit: Payer: Medicare HMO | Admitting: General Surgery

## 2021-12-28 ENCOUNTER — Ambulatory Visit (INDEPENDENT_AMBULATORY_CARE_PROVIDER_SITE_OTHER): Payer: Medicare HMO | Admitting: General Surgery

## 2021-12-28 ENCOUNTER — Telehealth: Payer: Self-pay | Admitting: "Endocrinology

## 2021-12-28 ENCOUNTER — Encounter: Payer: Self-pay | Admitting: General Surgery

## 2021-12-28 VITALS — BP 127/73 | HR 108 | Temp 98.6°F | Resp 18 | Ht 65.0 in | Wt 198.0 lb

## 2021-12-28 DIAGNOSIS — K8050 Calculus of bile duct without cholangitis or cholecystitis without obstruction: Secondary | ICD-10-CM | POA: Diagnosis not present

## 2021-12-28 NOTE — Telephone Encounter (Signed)
Received medical record request form from Dr Delphina Cahill. Sent records 12/28/21

## 2021-12-29 NOTE — Progress Notes (Signed)
Subjective:     Natalie Castro  Patient presented back for evaluation of her cholecystostomy tube.  She states that 2 days ago, she started having some nausea and epigastric pain.  She denies any fever or chills. Objective:    BP 127/73    Pulse (!) 108    Temp 98.6 F (37 C) (Other (Comment))    Resp 18    Ht 5\' 5"  (1.651 m)    Wt 198 lb (89.8 kg)    SpO2 95%    BMI 32.95 kg/m   General:  alert, cooperative, and no distress  Abdomen is soft.  I did reattach the cholecystostomy tube to the biliary bag.     Assessment:    History of biliary colic, status post cholecystostomy tube placement in the past.  Patient failed clamping of the cholecystostomy tube.  I told her that as she is a high risk surgical candidate, the cholecystostomy tube should remain in for some time.  We could attempt clamping of the tube in the future should she desire.    Plan:   Follow-up here as needed.

## 2021-12-31 DIAGNOSIS — E785 Hyperlipidemia, unspecified: Secondary | ICD-10-CM | POA: Diagnosis not present

## 2021-12-31 DIAGNOSIS — Z86711 Personal history of pulmonary embolism: Secondary | ICD-10-CM | POA: Diagnosis not present

## 2021-12-31 DIAGNOSIS — K819 Cholecystitis, unspecified: Secondary | ICD-10-CM | POA: Diagnosis not present

## 2021-12-31 DIAGNOSIS — Z950 Presence of cardiac pacemaker: Secondary | ICD-10-CM | POA: Diagnosis not present

## 2021-12-31 DIAGNOSIS — I5030 Unspecified diastolic (congestive) heart failure: Secondary | ICD-10-CM | POA: Diagnosis not present

## 2021-12-31 DIAGNOSIS — E119 Type 2 diabetes mellitus without complications: Secondary | ICD-10-CM | POA: Diagnosis not present

## 2021-12-31 DIAGNOSIS — R000 Tachycardia, unspecified: Secondary | ICD-10-CM | POA: Diagnosis not present

## 2021-12-31 DIAGNOSIS — I10 Essential (primary) hypertension: Secondary | ICD-10-CM | POA: Diagnosis not present

## 2021-12-31 DIAGNOSIS — Z794 Long term (current) use of insulin: Secondary | ICD-10-CM | POA: Diagnosis not present

## 2022-02-03 ENCOUNTER — Encounter (HOSPITAL_COMMUNITY): Payer: Self-pay | Admitting: *Deleted

## 2022-02-03 ENCOUNTER — Emergency Department (HOSPITAL_COMMUNITY): Payer: Medicare HMO

## 2022-02-03 ENCOUNTER — Other Ambulatory Visit: Payer: Self-pay

## 2022-02-03 ENCOUNTER — Inpatient Hospital Stay (HOSPITAL_COMMUNITY)
Admission: EM | Admit: 2022-02-03 | Discharge: 2022-02-09 | DRG: 871 | Disposition: A | Discharge status: Skilled Nursing Facility | Payer: Medicare HMO | Attending: Internal Medicine | Admitting: Internal Medicine

## 2022-02-03 DIAGNOSIS — E785 Hyperlipidemia, unspecified: Secondary | ICD-10-CM | POA: Diagnosis not present

## 2022-02-03 DIAGNOSIS — R7881 Bacteremia: Secondary | ICD-10-CM | POA: Diagnosis not present

## 2022-02-03 DIAGNOSIS — Z20822 Contact with and (suspected) exposure to covid-19: Secondary | ICD-10-CM | POA: Diagnosis present

## 2022-02-03 DIAGNOSIS — Z880 Allergy status to penicillin: Secondary | ICD-10-CM

## 2022-02-03 DIAGNOSIS — A419 Sepsis, unspecified organism: Secondary | ICD-10-CM | POA: Diagnosis present

## 2022-02-03 DIAGNOSIS — I442 Atrioventricular block, complete: Secondary | ICD-10-CM | POA: Diagnosis present

## 2022-02-03 DIAGNOSIS — M1990 Unspecified osteoarthritis, unspecified site: Secondary | ICD-10-CM | POA: Diagnosis present

## 2022-02-03 DIAGNOSIS — K7200 Acute and subacute hepatic failure without coma: Secondary | ICD-10-CM | POA: Diagnosis present

## 2022-02-03 DIAGNOSIS — I5032 Chronic diastolic (congestive) heart failure: Secondary | ICD-10-CM | POA: Diagnosis present

## 2022-02-03 DIAGNOSIS — D696 Thrombocytopenia, unspecified: Secondary | ICD-10-CM | POA: Diagnosis present

## 2022-02-03 DIAGNOSIS — A0811 Acute gastroenteropathy due to Norwalk agent: Secondary | ICD-10-CM | POA: Diagnosis present

## 2022-02-03 DIAGNOSIS — E782 Mixed hyperlipidemia: Secondary | ICD-10-CM | POA: Diagnosis present

## 2022-02-03 DIAGNOSIS — E8809 Other disorders of plasma-protein metabolism, not elsewhere classified: Secondary | ICD-10-CM | POA: Diagnosis present

## 2022-02-03 DIAGNOSIS — I700 Atherosclerosis of aorta: Secondary | ICD-10-CM | POA: Diagnosis not present

## 2022-02-03 DIAGNOSIS — K812 Acute cholecystitis with chronic cholecystitis: Secondary | ICD-10-CM | POA: Diagnosis present

## 2022-02-03 DIAGNOSIS — E872 Acidosis, unspecified: Secondary | ICD-10-CM | POA: Diagnosis present

## 2022-02-03 DIAGNOSIS — B9620 Unspecified Escherichia coli [E. coli] as the cause of diseases classified elsewhere: Secondary | ICD-10-CM | POA: Diagnosis not present

## 2022-02-03 DIAGNOSIS — E538 Deficiency of other specified B group vitamins: Secondary | ICD-10-CM | POA: Diagnosis present

## 2022-02-03 DIAGNOSIS — R1110 Vomiting, unspecified: Secondary | ICD-10-CM | POA: Diagnosis not present

## 2022-02-03 DIAGNOSIS — Z221 Carrier of other intestinal infectious diseases: Secondary | ICD-10-CM

## 2022-02-03 DIAGNOSIS — A0472 Enterocolitis due to Clostridium difficile, not specified as recurrent: Secondary | ICD-10-CM | POA: Diagnosis not present

## 2022-02-03 DIAGNOSIS — K219 Gastro-esophageal reflux disease without esophagitis: Secondary | ICD-10-CM | POA: Diagnosis present

## 2022-02-03 DIAGNOSIS — A4151 Sepsis due to Escherichia coli [E. coli]: Principal | ICD-10-CM | POA: Diagnosis present

## 2022-02-03 DIAGNOSIS — N390 Urinary tract infection, site not specified: Secondary | ICD-10-CM | POA: Diagnosis present

## 2022-02-03 DIAGNOSIS — I110 Hypertensive heart disease with heart failure: Secondary | ICD-10-CM | POA: Diagnosis present

## 2022-02-03 DIAGNOSIS — D6959 Other secondary thrombocytopenia: Secondary | ICD-10-CM | POA: Diagnosis present

## 2022-02-03 DIAGNOSIS — J9811 Atelectasis: Secondary | ICD-10-CM | POA: Diagnosis present

## 2022-02-03 DIAGNOSIS — Z9689 Presence of other specified functional implants: Secondary | ICD-10-CM | POA: Diagnosis present

## 2022-02-03 DIAGNOSIS — K819 Cholecystitis, unspecified: Secondary | ICD-10-CM | POA: Diagnosis not present

## 2022-02-03 DIAGNOSIS — Z7984 Long term (current) use of oral hypoglycemic drugs: Secondary | ICD-10-CM

## 2022-02-03 DIAGNOSIS — K811 Chronic cholecystitis: Secondary | ICD-10-CM

## 2022-02-03 DIAGNOSIS — E669 Obesity, unspecified: Secondary | ICD-10-CM | POA: Diagnosis present

## 2022-02-03 DIAGNOSIS — R8281 Pyuria: Secondary | ICD-10-CM | POA: Diagnosis present

## 2022-02-03 DIAGNOSIS — R109 Unspecified abdominal pain: Secondary | ICD-10-CM | POA: Diagnosis not present

## 2022-02-03 DIAGNOSIS — N179 Acute kidney failure, unspecified: Secondary | ICD-10-CM | POA: Diagnosis present

## 2022-02-03 DIAGNOSIS — I10 Essential (primary) hypertension: Secondary | ICD-10-CM | POA: Diagnosis not present

## 2022-02-03 DIAGNOSIS — Z950 Presence of cardiac pacemaker: Secondary | ICD-10-CM

## 2022-02-03 DIAGNOSIS — E119 Type 2 diabetes mellitus without complications: Secondary | ICD-10-CM | POA: Diagnosis present

## 2022-02-03 DIAGNOSIS — Z86711 Personal history of pulmonary embolism: Secondary | ICD-10-CM

## 2022-02-03 DIAGNOSIS — R6520 Severe sepsis without septic shock: Secondary | ICD-10-CM | POA: Diagnosis present

## 2022-02-03 DIAGNOSIS — R6521 Severe sepsis with septic shock: Secondary | ICD-10-CM | POA: Diagnosis present

## 2022-02-03 DIAGNOSIS — R7401 Elevation of levels of liver transaminase levels: Secondary | ICD-10-CM | POA: Diagnosis present

## 2022-02-03 DIAGNOSIS — R531 Weakness: Secondary | ICD-10-CM | POA: Diagnosis not present

## 2022-02-03 DIAGNOSIS — Z86010 Personal history of colonic polyps: Secondary | ICD-10-CM

## 2022-02-03 DIAGNOSIS — Z96653 Presence of artificial knee joint, bilateral: Secondary | ICD-10-CM | POA: Diagnosis present

## 2022-02-03 DIAGNOSIS — E1159 Type 2 diabetes mellitus with other circulatory complications: Secondary | ICD-10-CM | POA: Diagnosis not present

## 2022-02-03 DIAGNOSIS — Z6832 Body mass index (BMI) 32.0-32.9, adult: Secondary | ICD-10-CM

## 2022-02-03 DIAGNOSIS — M6281 Muscle weakness (generalized): Secondary | ICD-10-CM | POA: Diagnosis not present

## 2022-02-03 DIAGNOSIS — Z79899 Other long term (current) drug therapy: Secondary | ICD-10-CM

## 2022-02-03 DIAGNOSIS — Z8249 Family history of ischemic heart disease and other diseases of the circulatory system: Secondary | ICD-10-CM

## 2022-02-03 DIAGNOSIS — Z7901 Long term (current) use of anticoagulants: Secondary | ICD-10-CM

## 2022-02-03 DIAGNOSIS — R197 Diarrhea, unspecified: Secondary | ICD-10-CM | POA: Diagnosis not present

## 2022-02-03 DIAGNOSIS — I152 Hypertension secondary to endocrine disorders: Secondary | ICD-10-CM | POA: Diagnosis not present

## 2022-02-03 LAB — COMPREHENSIVE METABOLIC PANEL
ALT: 301 U/L — ABNORMAL HIGH (ref 0–44)
AST: 372 U/L — ABNORMAL HIGH (ref 15–41)
Albumin: 3.4 g/dL — ABNORMAL LOW (ref 3.5–5.0)
Alkaline Phosphatase: 157 U/L — ABNORMAL HIGH (ref 38–126)
Anion gap: 15 (ref 5–15)
BUN: 22 mg/dL (ref 8–23)
CO2: 23 mmol/L (ref 22–32)
Calcium: 8.8 mg/dL — ABNORMAL LOW (ref 8.9–10.3)
Chloride: 103 mmol/L (ref 98–111)
Creatinine, Ser: 2.11 mg/dL — ABNORMAL HIGH (ref 0.44–1.00)
GFR, Estimated: 23 mL/min — ABNORMAL LOW (ref >60)
Glucose, Bld: 121 mg/dL — ABNORMAL HIGH (ref 70–99)
Potassium: 4.1 mmol/L (ref 3.5–5.1)
Sodium: 141 mmol/L (ref 135–145)
Total Bilirubin: 3 mg/dL — ABNORMAL HIGH (ref 0.3–1.2)
Total Protein: 7.7 g/dL (ref 6.5–8.1)

## 2022-02-03 LAB — APTT: aPTT: 32 seconds (ref 24–36)

## 2022-02-03 LAB — PROTIME-INR
INR: 1.6 — ABNORMAL HIGH (ref 0.8–1.2)
Prothrombin Time: 19.3 seconds — ABNORMAL HIGH (ref 11.4–15.2)

## 2022-02-03 LAB — RESP PANEL BY RT-PCR (FLU A&B, COVID) ARPGX2
Influenza A by PCR: NEGATIVE
Influenza B by PCR: NEGATIVE
SARS Coronavirus 2 by RT PCR: NEGATIVE

## 2022-02-03 LAB — LACTIC ACID, PLASMA
Lactic Acid, Venous: 3.7 mmol/L (ref 0.5–1.9)
Lactic Acid, Venous: 4.1 mmol/L (ref 0.5–1.9)

## 2022-02-03 MED ORDER — SODIUM CHLORIDE 0.9 % IV BOLUS (SEPSIS)
1000.0000 mL | Freq: Once | INTRAVENOUS | Status: AC
  Administered 2022-02-03: 1000 mL via INTRAVENOUS

## 2022-02-03 MED ORDER — LACTATED RINGERS IV SOLN: INTRAVENOUS | Status: DC

## 2022-02-03 MED ORDER — SODIUM CHLORIDE 0.9 % IV SOLN: 1.0000 g | Freq: Once | INTRAVENOUS | Status: DC

## 2022-02-03 MED ORDER — NOREPINEPHRINE 4 MG/250ML-% IV SOLN
0.0000 ug/min | INTRAVENOUS | Status: DC
  Administered 2022-02-03: 2 ug/min via INTRAVENOUS
  Filled 2022-02-03: qty 250

## 2022-02-03 MED ORDER — SODIUM CHLORIDE 0.9 % IV BOLUS
1000.0000 mL | Freq: Once | INTRAVENOUS | Status: AC
Start: 2022-02-03 — End: 2022-02-03
  Administered 2022-02-03: 1000 mL via INTRAVENOUS

## 2022-02-03 MED ORDER — SODIUM CHLORIDE 0.9 % IV SOLN
2.0000 g | INTRAVENOUS | Status: DC
  Administered 2022-02-04: 2 g via INTRAVENOUS
  Filled 2022-02-03: qty 2

## 2022-02-03 MED ORDER — METRONIDAZOLE 500 MG/100ML IV SOLN
500.0000 mg | Freq: Once | INTRAVENOUS | Status: AC
  Administered 2022-02-03: 500 mg via INTRAVENOUS
  Filled 2022-02-03: qty 100

## 2022-02-03 MED ORDER — SODIUM CHLORIDE 0.9 % IV SOLN
2.0000 g | Freq: Once | INTRAVENOUS | Status: AC
  Administered 2022-02-03: 2 g via INTRAVENOUS
  Filled 2022-02-03: qty 2

## 2022-02-03 NOTE — ED Notes (Signed)
Extra blankets given to pt at this time. Natalie Castro

## 2022-02-03 NOTE — ED Notes (Signed)
Pt still has no urine at this time.

## 2022-02-03 NOTE — Sepsis Progress Note (Signed)
Notified provider of need to order repeat lactic acid. ° °

## 2022-02-03 NOTE — ED Triage Notes (Signed)
Pt with emesis and diarrhea started last night.  Pt with generalized weakness. Pt with drain to gallbladder and family member states when last flushed, pt c/o pain and draining around the insertion site.

## 2022-02-03 NOTE — Sepsis Progress Note (Signed)
Elink following Code Sepsis. 

## 2022-02-03 NOTE — ED Provider Notes (Addendum)
Natalie Castro Provider Note   CSN: [id number] Arrival date & time: 02/03/22  1755     History  Chief Complaint  Patient presents with   Emesis    Natalie Castro is a 81 y.o. female.   Emesis  81 year old female, history of pacemaker, history of blood clot on Eliquis, history of hypertension on hydralazine, losartan, metoprolol and the history of diabetes on metformin.  She also has a history of chronic cholecystitis, she is a poor surgical candidate and thus has a cholecystostomy drain that has been placed within the last 6 or 7 months and what for which she has had chronically.  For the last couple of days the patient has had increasing generalized weakness, nausea vomiting and diarrhea, she presents very weak and lightheaded and dizzy.  She was found to be hypotensive on arrival to 87/53  Home Medications Prior to Admission medications   Medication Sig Start Date End Date Taking? Authorizing Provider  acetaminophen (TYLENOL) 500 MG tablet Take 1,000 mg by mouth every 6 (six) hours as needed.    [provider]  atorvastatin (LIPITOR) 40 MG tablet TAKE (1) TABLET BY MOUTH AT BEDTIME. Patient taking differently: Take 40 mg by mouth at bedtime. 05/28/20   Corum, Rex Kras, MD  ELIQUIS 5 MG TABS tablet TAKE 1 TABLET BY MOUTH TWICE A DAY. 05/05/20   Corum, Rex Kras, MD  furosemide (LASIX) 40 MG tablet Take 1 tablet (40 mg total) by mouth daily. 08/19/21   Evans Lance, MD  glucose blood test strip Use to check blood glucose fasting , before lunch and dinner and after your largest meal 11/04/19   Corum, Rex Kras, MD  hydrALAZINE (APRESOLINE) 50 MG tablet Take 1 tablet (50 mg total) by mouth 2 (two) times daily. Patient taking differently: Take 50 mg by mouth 3 (three) times daily. 07/11/21 12/21/21  Elodia Florence., MD  Lancets [facility] ULTRASOFT) lancets Use to check glucose 4 x daily 11/04/19   Corum, Rex Kras, MD  LANTUS SOLOSTAR 100 UNIT/ML Solostar Pen  ADMINISTER 30 UNITS UNDER THE SKIN AT BEDTIME Patient taking differently: Inject 30 Units into the skin at bedtime. 08/06/21   Cassandria Anger, MD  losartan (COZAAR) 100 MG tablet TAKE (1) TABLET BY MOUTH ONCE DAILY. Patient taking differently: Take 100 mg by mouth daily. 05/28/20   Corum, Rex Kras, MD  metFORMIN (GLUCOPHAGE) 1000 MG tablet TAKE (1) TABLET BY MOUTH TWICE DAILY. 05/17/21   Cassandria Anger, MD  metoprolol succinate (TOPROL-XL) 50 MG 24 hr tablet Take 1 tablet (50 mg total) by mouth 2 (two) times daily. Take with or immediately following a meal. 09/07/21   Evans Lance, MD  NIFEdipine (PROCARDIA-XL/NIFEDICAL-XL) 30 MG 24 hr tablet Take 30 mg by mouth daily. 10/26/21   [provider]  nitroGLYCERIN (NITROSTAT) 0.4 MG SL tablet Place 1 tablet (0.4 mg total) under the tongue every 5 (five) minutes as needed for chest pain. 02/24/21   Richardson Dopp T, PA-C  polyethylene glycol (MIRALAX / GLYCOLAX) 17 g packet Take 17 g by mouth daily as needed for mild constipation. 08/28/21   Cristal Deer, MD  potassium chloride (KLOR-CON) 10 MEQ tablet Take 1 tablet (10 mEq total) by mouth daily. 08/19/21   Evans Lance, MD      Allergies    Penicillins    Review of Systems   Review of Systems  Gastrointestinal:  Positive for vomiting.  All other systems reviewed and  are negative.  Physical Exam Updated Vital Signs BP (!) 98/56    Pulse 85    Temp 98.1 F (36.7 C) (Oral)    Resp (!) 23    Ht 1.651 m (5\' 5" )    Wt 88.5 kg    SpO2 97%    BMI 32.45 kg/m  Physical Exam Vitals and nursing note reviewed.  Constitutional:      General: She is in acute distress.     Appearance: She is well-developed. She is ill-appearing.  HENT:     Head: Normocephalic and atraumatic.     Nose: No congestion or rhinorrhea.     Mouth/Throat:     Mouth: Mucous membranes are moist.     Pharynx: No oropharyngeal exudate.  Eyes:     General: No scleral icterus.       Right eye: No discharge.         Left eye: No discharge.     Conjunctiva/sclera: Conjunctivae normal.     Pupils: Pupils are equal, round, and reactive to light.  Neck:     Thyroid: No thyromegaly.     Vascular: No JVD.  Cardiovascular:     Rate and Rhythm: Normal rate and regular rhythm.     Heart sounds: Normal heart sounds. No murmur heard.   No friction rub. No gallop.  Pulmonary:     Effort: Pulmonary effort is normal. No respiratory distress.     Breath sounds: Normal breath sounds. No wheezing or rales.  Abdominal:     General: Bowel sounds are normal. There is distension.     Palpations: Abdomen is soft. There is no mass.     Tenderness: There is no abdominal tenderness.     Comments: Cystostomy tube is in place, there is some clearish material in the drain, minimal tenderness to the right upper quadrant  Musculoskeletal:        General: No tenderness. Normal range of motion.     Cervical back: Normal range of motion and neck supple.  Lymphadenopathy:     Cervical: No cervical adenopathy.  Skin:    General: Skin is warm and dry.     Findings: No erythema or rash.  Neurological:     General: No focal deficit present.     Mental Status: She is alert.     Coordination: Coordination normal.  Psychiatric:        Behavior: Behavior normal.    ED Results / Procedures / Treatments   Labs (all labs ordered are listed, but only abnormal results are displayed) Labs Reviewed  COMPREHENSIVE METABOLIC PANEL - Abnormal; Notable for the following components:      Result Value   Glucose, Bld 121 (*)    Creatinine, Ser 2.11 (*)    Calcium 8.8 (*)    Albumin 3.4 (*)    AST 372 (*)    ALT 301 (*)    Alkaline Phosphatase 157 (*)    Total Bilirubin 3.0 (*)    GFR, Estimated 23 (*)    All other components within normal limits  LACTIC ACID, PLASMA - Abnormal; Notable for the following components:   Lactic Acid, Venous 3.7 (*)    All other components within normal limits  CBC WITH DIFFERENTIAL/PLATELET -  Abnormal; Notable for the following components:   WBC 22.3 (*)    MCV 102.8 (*)    Platelets 145 (*)    Neutro Abs 19.6 (*)    All other components within normal limits  PROTIME-INR -  Abnormal; Notable for the following components:   Prothrombin Time 19.3 (*)    INR 1.6 (*)    All other components within normal limits  CULTURE, BLOOD (ROUTINE X 2)  CULTURE, BLOOD (ROUTINE X 2)  RESP PANEL BY RT-PCR (FLU A&B, COVID) ARPGX2  URINE CULTURE  APTT  LACTIC ACID, PLASMA  URINALYSIS, ROUTINE W REFLEX MICROSCOPIC    EKG EKG Interpretation  Date/Time:  Thursday February 03 2022 21:29:38 EST Ventricular Rate:  87 PR Interval:  259 QRS Duration: 155 QT Interval:  467 QTC Calculation: 562 R Axis:   221 Text Interpretation: Sinus rhythm Prolonged PR interval Consider left atrial enlargement Right bundle branch block Anterolateral infarct, age indeterminate Confirmed by Noemi Chapel [phone]) on 02/03/2022 9:48:30 PM  Radiology CT ABDOMEN PELVIS WO CONTRAST  Result Date: 02/03/2022 CLINICAL DATA:  Abdominal pain, acute, nonlocalized. Emesis and diarrhea beginning last night. Generalized weakness. Gallbladder drain in place. EXAM: CT ABDOMEN AND PELVIS WITHOUT CONTRAST TECHNIQUE: Multidetector CT imaging of the abdomen and pelvis was performed following the standard protocol without IV contrast. RADIATION DOSE REDUCTION: This exam was performed according to the departmental dose-optimization program which includes automated exposure control, adjustment of the mA and/or kV according to patient size and/or use of iterative reconstruction technique. COMPARISON:  10/30/2021 FINDINGS: Lower chest: Mild atelectasis and or scarring at the lung bases as seen previously. Cardiomegaly. Pacemaker in place. Hepatobiliary: Liver cysts seen in the right lobe, unchanged. Percutaneous drain in the gallbladder fossa without evidence of fluid collection or ongoing inflammatory change. Is this drain being actively  managed? Pancreas: Normal Spleen: Normal Adrenals/Urinary Tract: Adrenal glands are normal. Kidneys are normal. Bladder is normal. Stomach/Bowel: Stomach and small intestine are normal. Normal appendix. Ordinary diverticulosis of the left colon without evidence of diverticulitis. Vascular/Lymphatic: Aortic atherosclerosis. No aneurysm. IVC is normal. No adenopathy. Reproductive: No pelvic mass. Other: No free fluid or air. Musculoskeletal: Ordinary spinal curvature and degenerative change. IMPRESSION: Drain remains in place in the gallbladder fossa region. No evidence of abscess or residual inflammation in that region. Is this catheter being actively managed? Aortic Atherosclerosis (ICD10-I70.0). No other significant or acute finding. Electronically Signed   By: Nelson Chimes M.D.   On: 02/03/2022 22:25   DG Chest 2 View  Result Date: 02/03/2022 CLINICAL DATA:  Suspected sepsis.  Vomiting, diarrhea, weakness EXAM: CHEST - 2 VIEW COMPARISON:  08/22/2021 FINDINGS: Cardiac pacemaker. Heart size and pulmonary vascularity are normal. Lungs are clear. No pleural effusions. No pneumothorax. Mediastinal contours appear intact. Calcification of the aorta. Degenerative changes in the spine and shoulders. Pigtail drainage catheter in the right upper quadrant. IMPRESSION: No active cardiopulmonary disease. Electronically Signed   By: Lucienne Capers M.D.   On: 02/03/2022 19:59    Procedures .Critical Care Performed by: Noemi Chapel, MD Authorized by: Noemi Chapel, MD   Critical care provider statement:    Critical care time (minutes):  45   Critical care time was exclusive of:  Separately billable procedures and treating other patients   Critical care was necessary to treat or prevent imminent or life-threatening deterioration of the following conditions:  Sepsis and shock   Critical care was time spent personally by me on the following activities:  Development of treatment plan with patient or surrogate,  discussions with consultants, evaluation of patient's response to treatment, examination of patient, obtaining history from patient or surrogate, review of old charts, re-evaluation of patient's condition, pulse oximetry, ordering and review of radiographic studies, ordering and review of  laboratory studies and ordering and performing treatments and interventions   I assumed direction of critical care for this patient from another provider in my specialty: no     Care discussed with: admitting provider   Comments:          Medications Ordered in ED Medications  lactated ringers infusion (has no administration in time range)  ceFEPIme (MAXIPIME) 2 g in sodium chloride 0.9 % 100 mL IVPB (has no administration in time range)  sodium chloride 0.9 % bolus 1,000 mL (1,000 mLs Intravenous New Bag/Given 02/03/22 2125)  aztreonam (AZACTAM) 2 g in sodium chloride 0.9 % 100 mL IVPB (0 g Intravenous Stopped 02/03/22 2216)  metroNIDAZOLE (FLAGYL) IVPB 500 mg (500 mg Intravenous New Bag/Given 02/03/22 2144)  sodium chloride 0.9 % bolus 1,000 mL (1,000 mLs Intravenous New Bag/Given 02/03/22 2149)    And  sodium chloride 0.9 % bolus 1,000 mL (1,000 mLs Intravenous New Bag/Given 02/03/22 2150)    And  sodium chloride 0.9 % bolus 1,000 mL (1,000 mLs Intravenous New Bag/Given 02/03/22 2149)    ED Course/ Medical Decision Making/ A&P                           Medical Decision Making Amount and/or Complexity of Data Reviewed Labs: ordered. Radiology: ordered.  Risk Prescription drug management. Decision regarding hospitalization.   This patient presents to the ED for concern of generalized weakness, this involves an extensive number of treatment options, and is a complaint that carries with it a high risk of complications and morbidity.  The differential diagnosis includes septic,, severe sepsis, obstructive cholangitis, cholelithiasis or cholecystitis.  This could be choledocholithiasis or pancreatitis   Co  morbidities that complicate the patient evaluation  The patient is a diabetic, she is a poor surgical candidate, she is anticoagulated   Additional history obtained:  Additional history obtained from electronic medical record and the family member at the bedside External records from outside source obtained and reviewed including the prior admission to the hospital in November 2022 approximately 3 months ago, it was noted that the patient had initially had her percutaneous cholecystostomy tube removed, it had to be replaced 3 months ago and was suspected to be permanent at that point because of recurrent cholecystitis, she had a bacteremia from E. coli during that visit.   Lab Tests:  I Ordered, and personally interpreted labs.  The pertinent results include: Transaminitis with LFTs in the 300s, leukocytosis of 22,000   Imaging Studies ordered:  I ordered imaging studies including portable chest x-ray and a CT scan of the abdomen and pelvis I independently visualized and interpreted imaging which showed cholecystostomy in the gallbladder fossa, no signs of abscess I agree with the radiologist interpretation   Cardiac Monitoring:  The patient was maintained on a cardiac monitor.  I personally viewed and interpreted the cardiac monitored which showed an underlying rhythm of: Paced rhythm   Medicines ordered and prescription drug management:  I ordered medication including strong antibiotics and IV fluids for sepsis, shock, hypotension Reevaluation of the patient after these medicines showed that the patient stayed the same I have reviewed the patients home medicines and have made adjustments as needed   Test Considered:  Admission considered, patient agreeable to be admitted due to what appears to be sepsis or even septic shock, she is responding to IV fluid   Critical Interventions:  IV fluids, antibiotics, CT scan to evaluate for cause Vasopressors,  Levophed  added   Consultations Obtained:  I requested consultation with the Dr. Arnoldo Morale of the general surgery service who recommends that the patient be placed on the hospitalist service for antibiotics and he will see the patient in the morning, discussed with hospital,  and discussed lab and imaging findings as well as pertinent plan - they recommend: Admission to the hospital   Problem List / ED Course:  Sepsis/shock/cholecystitis, patient given antibiotics and IV fluids, gradually improving.   Reevaluation:  After the interventions noted above, I reevaluated the patient and found that they have : Improved   Social Determinants of Health:  None   Dispostion:  After consideration of the diagnostic results and the patients response to treatment, I feel that the patent would benefit from admission to the hospital to higher level of care.           Final Clinical Impression(s) / ED Diagnoses Final diagnoses:  Septic shock ([place])  Chronic cholecystitis        Noemi Chapel, MD 02/03/22 2252    Noemi Chapel, MD 02/04/22 0002

## 2022-02-03 NOTE — ED Provider Triage Note (Signed)
Emergency Medicine Provider Triage Evaluation Note  Natalie Castro , a 81 y.o. female  was evaluated in triage.  Pt complains of abdominal pain and subjective fevers.  Ennever at bedside reports that patient had pain when having her cholecystostomy tube emptied by family member.  Patient has had continued pain since then.  Subjective fevers reported by patient.  Review of Systems  Positive: Abdominal pain, subjective fever Negative: Vomiting, dysuria, hematuria, urinary frequency  Physical Exam  BP (!) 87/53 (BP Location: Right Arm)    Pulse 66    Temp 98.1 F (36.7 C) (Oral)    Resp 19    Ht 5\' 5"  (1.651 m)    Wt 88.5 kg    SpO2 91%    BMI 32.45 kg/m  Gen:   Awake, no distress   Resp:  Normal effort  MSK:   Moves extremities without difficulty  Other:  Abdomen soft, nondistended, nontender.  No gross fluids on bandages around cholecystostomy tube site.  Medical Decision Making  Medically screening exam initiated at 8:11 PM.  Appropriate orders placed.  JACQULYN BARRESI was informed that the remainder of the evaluation will be completed by another provider, this initial triage assessment does not replace that evaluation, and the importance of remaining in the ED until their evaluation is complete.  Patient hypotensive with concern for possible infection.  ED sepsis work-up initiated.  Patient will need to come back to this available room.   Loni Beckwith, [place] 02/03/22 2013

## 2022-02-04 ENCOUNTER — Inpatient Hospital Stay (HOSPITAL_COMMUNITY): Payer: Medicare HMO

## 2022-02-04 DIAGNOSIS — E538 Deficiency of other specified B group vitamins: Secondary | ICD-10-CM | POA: Diagnosis not present

## 2022-02-04 DIAGNOSIS — R8281 Pyuria: Secondary | ICD-10-CM | POA: Diagnosis present

## 2022-02-04 DIAGNOSIS — R6521 Severe sepsis with septic shock: Secondary | ICD-10-CM | POA: Diagnosis present

## 2022-02-04 DIAGNOSIS — D696 Thrombocytopenia, unspecified: Secondary | ICD-10-CM | POA: Diagnosis present

## 2022-02-04 DIAGNOSIS — Z96653 Presence of artificial knee joint, bilateral: Secondary | ICD-10-CM | POA: Diagnosis present

## 2022-02-04 DIAGNOSIS — N179 Acute kidney failure, unspecified: Secondary | ICD-10-CM

## 2022-02-04 DIAGNOSIS — K812 Acute cholecystitis with chronic cholecystitis: Secondary | ICD-10-CM | POA: Diagnosis not present

## 2022-02-04 DIAGNOSIS — R7401 Elevation of levels of liver transaminase levels: Secondary | ICD-10-CM | POA: Diagnosis present

## 2022-02-04 DIAGNOSIS — K219 Gastro-esophageal reflux disease without esophagitis: Secondary | ICD-10-CM | POA: Diagnosis present

## 2022-02-04 DIAGNOSIS — Z9689 Presence of other specified functional implants: Secondary | ICD-10-CM | POA: Diagnosis present

## 2022-02-04 DIAGNOSIS — I5032 Chronic diastolic (congestive) heart failure: Secondary | ICD-10-CM | POA: Diagnosis not present

## 2022-02-04 DIAGNOSIS — I442 Atrioventricular block, complete: Secondary | ICD-10-CM

## 2022-02-04 DIAGNOSIS — R6520 Severe sepsis without septic shock: Secondary | ICD-10-CM | POA: Diagnosis not present

## 2022-02-04 DIAGNOSIS — E872 Acidosis, unspecified: Secondary | ICD-10-CM

## 2022-02-04 DIAGNOSIS — E669 Obesity, unspecified: Secondary | ICD-10-CM

## 2022-02-04 DIAGNOSIS — I10 Essential (primary) hypertension: Secondary | ICD-10-CM | POA: Diagnosis not present

## 2022-02-04 DIAGNOSIS — A4151 Sepsis due to Escherichia coli [E. coli]: Secondary | ICD-10-CM | POA: Diagnosis present

## 2022-02-04 DIAGNOSIS — A0811 Acute gastroenteropathy due to Norwalk agent: Secondary | ICD-10-CM | POA: Diagnosis present

## 2022-02-04 DIAGNOSIS — J9811 Atelectasis: Secondary | ICD-10-CM | POA: Diagnosis present

## 2022-02-04 DIAGNOSIS — A419 Sepsis, unspecified organism: Secondary | ICD-10-CM | POA: Diagnosis not present

## 2022-02-04 DIAGNOSIS — E782 Mixed hyperlipidemia: Secondary | ICD-10-CM | POA: Diagnosis present

## 2022-02-04 DIAGNOSIS — E119 Type 2 diabetes mellitus without complications: Secondary | ICD-10-CM

## 2022-02-04 DIAGNOSIS — K7200 Acute and subacute hepatic failure without coma: Secondary | ICD-10-CM | POA: Diagnosis present

## 2022-02-04 DIAGNOSIS — M1990 Unspecified osteoarthritis, unspecified site: Secondary | ICD-10-CM | POA: Diagnosis present

## 2022-02-04 DIAGNOSIS — E8809 Other disorders of plasma-protein metabolism, not elsewhere classified: Secondary | ICD-10-CM | POA: Diagnosis present

## 2022-02-04 DIAGNOSIS — K811 Chronic cholecystitis: Secondary | ICD-10-CM | POA: Diagnosis not present

## 2022-02-04 DIAGNOSIS — Z20822 Contact with and (suspected) exposure to covid-19: Secondary | ICD-10-CM | POA: Diagnosis present

## 2022-02-04 DIAGNOSIS — N390 Urinary tract infection, site not specified: Secondary | ICD-10-CM | POA: Diagnosis present

## 2022-02-04 DIAGNOSIS — I110 Hypertensive heart disease with heart failure: Secondary | ICD-10-CM | POA: Diagnosis present

## 2022-02-04 DIAGNOSIS — R718 Other abnormality of red blood cells: Secondary | ICD-10-CM

## 2022-02-04 DIAGNOSIS — R7881 Bacteremia: Secondary | ICD-10-CM | POA: Diagnosis not present

## 2022-02-04 DIAGNOSIS — D6959 Other secondary thrombocytopenia: Secondary | ICD-10-CM | POA: Diagnosis present

## 2022-02-04 LAB — COMPREHENSIVE METABOLIC PANEL
ALT: 223 U/L — ABNORMAL HIGH (ref 0–44)
AST: 219 U/L — ABNORMAL HIGH (ref 15–41)
Albumin: 2.7 g/dL — ABNORMAL LOW (ref 3.5–5.0)
Alkaline Phosphatase: 124 U/L (ref 38–126)
Anion gap: 7 (ref 5–15)
BUN: 26 mg/dL — ABNORMAL HIGH (ref 8–23)
CO2: 24 mmol/L (ref 22–32)
Calcium: 7.8 mg/dL — ABNORMAL LOW (ref 8.9–10.3)
Chloride: 110 mmol/L (ref 98–111)
Creatinine, Ser: 1.69 mg/dL — ABNORMAL HIGH (ref 0.44–1.00)
GFR, Estimated: 31 mL/min — ABNORMAL LOW (ref >60)
Glucose, Bld: 159 mg/dL — ABNORMAL HIGH (ref 70–99)
Potassium: 4.3 mmol/L (ref 3.5–5.1)
Sodium: 141 mmol/L (ref 135–145)
Total Bilirubin: 1.9 mg/dL — ABNORMAL HIGH (ref 0.3–1.2)
Total Protein: 6.3 g/dL — ABNORMAL LOW (ref 6.5–8.1)

## 2022-02-04 LAB — BLOOD CULTURE ID PANEL (REFLEXED) - BCID2

## 2022-02-04 LAB — URINALYSIS, ROUTINE W REFLEX MICROSCOPIC
Bilirubin Urine: NEGATIVE
Glucose, UA: 50 mg/dL — AB
Ketones, ur: NEGATIVE mg/dL
Leukocytes,Ua: NEGATIVE
Nitrite: NEGATIVE
Protein, ur: 100 mg/dL — AB
Specific Gravity, Urine: 1.02 (ref 1.005–1.030)
pH: 5 (ref 5.0–8.0)

## 2022-02-04 LAB — MRSA NEXT GEN BY PCR, NASAL: MRSA by PCR Next Gen: NOT DETECTED

## 2022-02-04 LAB — CBC
HCT: 35.1 % — ABNORMAL LOW (ref 36.0–46.0)
Hemoglobin: 10.8 g/dL — ABNORMAL LOW (ref 12.0–15.0)
MCH: 32.1 pg (ref 26.0–34.0)
MCHC: 30.8 g/dL (ref 30.0–36.0)
MCV: 104.5 fL — ABNORMAL HIGH (ref 80.0–100.0)
Platelets: 114 10*3/uL — ABNORMAL LOW (ref 150–400)
RBC: 3.36 MIL/uL — ABNORMAL LOW (ref 3.87–5.11)
RDW: 15 % (ref 11.5–15.5)
WBC: 20.3 10*3/uL — ABNORMAL HIGH (ref 4.0–10.5)
nRBC: 0 % (ref 0.0–0.2)

## 2022-02-04 LAB — GLUCOSE, CAPILLARY
Glucose-Capillary: 144 mg/dL — ABNORMAL HIGH (ref 70–99)
Glucose-Capillary: 113 mg/dL — ABNORMAL HIGH (ref 70–99)
Glucose-Capillary: 121 mg/dL — ABNORMAL HIGH (ref 70–99)
Glucose-Capillary: 145 mg/dL — ABNORMAL HIGH (ref 70–99)
Glucose-Capillary: 129 mg/dL — ABNORMAL HIGH (ref 70–99)

## 2022-02-04 LAB — HEMOGLOBIN A1C
Hgb A1c MFr Bld: 6.5 % — ABNORMAL HIGH (ref 4.8–5.6)
Mean Plasma Glucose: 139.85 mg/dL

## 2022-02-04 LAB — PHOSPHORUS: Phosphorus: 3.9 mg/dL (ref 2.5–4.6)

## 2022-02-04 LAB — HEPATITIS PANEL, ACUTE
HCV Ab: NONREACTIVE
Hep A IgM: NONREACTIVE
Hep B C IgM: NONREACTIVE
Hepatitis B Surface Ag: NONREACTIVE

## 2022-02-04 LAB — LACTIC ACID, PLASMA: Lactic Acid, Venous: 2.3 mmol/L (ref 0.5–1.9)

## 2022-02-04 LAB — C DIFFICILE QUICK SCREEN W PCR REFLEX
C Diff antigen: POSITIVE — AB
C Diff toxin: NEGATIVE

## 2022-02-04 LAB — CORTISOL-AM, BLOOD: Cortisol - AM: 20.7 ug/dL (ref 6.7–22.6)

## 2022-02-04 LAB — MAGNESIUM: Magnesium: 1.5 mg/dL — ABNORMAL LOW (ref 1.7–2.4)

## 2022-02-04 LAB — FOLATE: Folate: 8.3 ng/mL (ref >5.9)

## 2022-02-04 LAB — PROCALCITONIN: Procalcitonin: 15.12 ng/mL

## 2022-02-04 LAB — VITAMIN B12: Vitamin B-12: 80 pg/mL — ABNORMAL LOW (ref 180–914)

## 2022-02-04 MED ORDER — ACETAMINOPHEN 650 MG RE SUPP: 650.0000 mg | Freq: Four times a day (QID) | RECTAL | Status: DC | PRN

## 2022-02-04 MED ORDER — ACETAMINOPHEN 325 MG PO TABS
650.0000 mg | ORAL_TABLET | Freq: Four times a day (QID) | ORAL | Status: DC | PRN
  Administered 2022-02-04 – 2022-02-06 (×5): 650 mg via ORAL
  Filled 2022-02-04 (×5): qty 2

## 2022-02-04 MED ORDER — INSULIN ASPART 100 UNIT/ML IJ SOLN: 0.0000 [IU] | Freq: Every day | INTRAMUSCULAR | Status: DC

## 2022-02-04 MED ORDER — SODIUM CHLORIDE 0.9 % IV SOLN
2.0000 g | INTRAVENOUS | Status: DC
  Administered 2022-02-04: 2 g via INTRAVENOUS
  Filled 2022-02-04: qty 20

## 2022-02-04 MED ORDER — INSULIN ASPART 100 UNIT/ML IJ SOLN
0.0000 [IU] | Freq: Three times a day (TID) | INTRAMUSCULAR | Status: DC
  Administered 2022-02-04 (×2): 1 [IU] via SUBCUTANEOUS
  Administered 2022-02-05: 2 [IU] via SUBCUTANEOUS
  Administered 2022-02-05: 1 [IU] via SUBCUTANEOUS
  Administered 2022-02-05: 2 [IU] via SUBCUTANEOUS
  Administered 2022-02-06: 1 [IU] via SUBCUTANEOUS
  Administered 2022-02-06: 3 [IU] via SUBCUTANEOUS
  Administered 2022-02-06: 1 [IU] via SUBCUTANEOUS
  Administered 2022-02-07 (×2): 2 [IU] via SUBCUTANEOUS
  Administered 2022-02-07: 1 [IU] via SUBCUTANEOUS
  Administered 2022-02-08 (×3): 2 [IU] via SUBCUTANEOUS
  Administered 2022-02-09: 3 [IU] via SUBCUTANEOUS
  Administered 2022-02-09: 2 [IU] via SUBCUTANEOUS

## 2022-02-04 MED ORDER — LACTATED RINGERS IV SOLN: INTRAVENOUS | Status: AC

## 2022-02-04 MED ORDER — CYANOCOBALAMIN 1000 MCG/ML IJ SOLN
1000.0000 ug | Freq: Once | INTRAMUSCULAR | Status: AC
  Administered 2022-02-04: 1000 ug via INTRAMUSCULAR
  Filled 2022-02-04: qty 1

## 2022-02-04 MED ORDER — ENOXAPARIN SODIUM 30 MG/0.3ML IJ SOSY
30.0000 mg | PREFILLED_SYRINGE | Freq: Every day | INTRAMUSCULAR | Status: DC
  Administered 2022-02-04 – 2022-02-05 (×2): 30 mg via SUBCUTANEOUS
  Filled 2022-02-04 (×2): qty 0.3

## 2022-02-04 MED ORDER — CHLORHEXIDINE GLUCONATE CLOTH 2 % EX PADS
6.0000 | MEDICATED_PAD | Freq: Every day | CUTANEOUS | Status: DC
  Administered 2022-02-04 – 2022-02-09 (×4): 6 via TOPICAL

## 2022-02-04 MED ORDER — METRONIDAZOLE 500 MG/100ML IV SOLN
500.0000 mg | Freq: Two times a day (BID) | INTRAVENOUS | Status: DC
  Administered 2022-02-04 – 2022-02-05 (×3): 500 mg via INTRAVENOUS
  Filled 2022-02-04 (×3): qty 100

## 2022-02-04 MED ORDER — VITAMIN B-12 100 MCG PO TABS
500.0000 ug | ORAL_TABLET | Freq: Every day | ORAL | Status: DC
  Administered 2022-02-05 – 2022-02-09 (×5): 500 ug via ORAL
  Filled 2022-02-04 (×5): qty 5

## 2022-02-04 NOTE — Consult Note (Signed)
Reason for Consult: Sepsis, question hepatobiliary etiology Referring Physician: Dr. Loyola Mast is an 81 y.o. Castro.  HPI: Patient is a Natalie Castro with multiple medical problems who I saw last month in my office for management of her cholecystostomy tube.  She has had this in place since last July 2022.  She has not a surgical candidate, thus that is why the cholecystostomy tube was placed.  She has had multiple trials of clamping and removal with the tube ultimately being replaced.  She states that currently she is not having any right upper quadrant abdominal pain.  Her family has been taking care of the cholecystostomy tube and has been irrigating it as instructed.  Patient was admitted to the ICU overnight due to her sepsis, she seems to be improving.  Past Medical History:  Diagnosis Date   Acid reflux    Arthritis    Colon adenomas    AGE 43   Complete heart block (HCC)    STJ PPM Dr. 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, long term anticoag with apixaban    Hyperlipidemia    Myoview 03/2021    Myoview 4/22: EF 50, no infarct or ischemia; low risk   Presence of permanent cardiac pacemaker    Type 2 diabetes mellitus ([place])     Past Surgical History:  Procedure Laterality Date   COLONOSCOPY     3 SIMPLE ADENOMAS, AGE 75   COLONOSCOPY N/A 05/24/2018   Procedure: COLONOSCOPY;  Surgeon: Danie Binder, MD;  [facility];  Service: Endoscopy;  Laterality: N/A;  1:00pm   EP IMPLANTABLE DEVICE N/A 11/24/2015   Procedure: Pacemaker Implant;  Surgeon: [name], MD;  Location: [place] [facility];  Service: Cardiovascular;  Laterality: N/A;   IR EXCHANGE BILIARY DRAIN  07/27/2021   IR EXCHANGE BILIARY DRAIN  08/23/2021   IR PERC CHOLECYSTOSTOMY  07/08/2021   IR PERC CHOLECYSTOSTOMY  11/04/2021   IR RADIOLOGIST EVAL & MGMT  09/22/2021   JOINT REPLACEMENT     knees bilat.   LEFT HEART CATH AND CORONARY  ANGIOGRAPHY N/A 07/02/2021   Procedure: LEFT HEART CATH AND CORONARY ANGIOGRAPHY;  Surgeon: Jettie Booze, MD;  Location: [facility];  Service: Cardiovascular;  Laterality: N/A;   POLYPECTOMY  05/24/2018   Procedure: POLYPECTOMY;  Surgeon: Danie Binder, MD;  [facility];  Service: Endoscopy;;  ascending and hepatic flexure, transverse   TEE WITHOUT CARDIOVERSION N/A 08/27/2021   Procedure: TRANSESOPHAGEAL ECHOCARDIOGRAM (TEE);  Surgeon: Pixie Casino, MD;  Location: [facility] ENDOSCOPY;  Service: Cardiovascular;  Laterality: N/A;   TUBAL LIGATION      Family History  Problem Relation Age of Onset   Heart failure Mother    Hypertension Mother    Heart failure Sister    Hypertension Father    Cancer Sister    Dementia Sister    Colon cancer Neg Hx    Colon polyps Neg Hx     Social History:  reports that she has never smoked. She has never used smokeless tobacco. She reports that she does not drink alcohol and does not use drugs.  Allergies:  Allergies  Allergen Reactions   Penicillins Hives, Itching and Other (See Comments)    Has tolerated ceftriaxone 7/22 Has patient had a PCN reaction causing immediate rash, facial/tongue/throat swelling, SOB or lightheadedness with hypotension: No Has patient had a PCN reaction  causing severe rash involving mucus membranes or skin necrosis: Yes Has patient had a PCN reaction that required hospitalization No Has patient had a PCN reaction occurring within the last 10 years: No If all of the above answers are "NO", then may proceed with Cephalosporin use.     Medications: I have reviewed the patient's current medications.  Results for orders placed or performed during the hospital encounter of 02/03/22 (from the past 48 hour(s))  Comprehensive metabolic panel     Status: Abnormal   Collection Time: 02/03/22  8:22 PM  Result Value Ref Range   Sodium 141 135 - 145 mmol/L   Potassium 4.1 3.5 - 5.1 mmol/L   Chloride 103 98  - 111 mmol/L   CO2 23 22 - 32 mmol/L   Glucose, Bld 121 (H) 70 - 99 mg/dL    Comment: Glucose reference range applies only to samples taken after fasting for at least 8 hours.   BUN 22 8 - 23 mg/dL   Creatinine, Ser 2.11 (H) 0.44 - 1.00 mg/dL   Calcium 8.8 (L) 8.9 - 10.3 mg/dL   Total Protein 7.7 6.5 - 8.1 g/dL   Albumin 3.4 (L) 3.5 - 5.0 g/dL   AST 372 (H) 15 - 41 U/L   ALT 301 (H) 0 - 44 U/L   Alkaline Phosphatase 157 (H) Natalie - 126 U/L   Total Bilirubin 3.0 (H) 0.3 - 1.2 mg/dL   GFR, Estimated 23 (L) >60 mL/min    Comment: (NOTE) Calculated using the CKD-EPI Creatinine Equation (2021)    Anion gap 15 5 - 15    Comment: Performed at [facility], [address] [place], [place] [address]  Lactic acid, plasma     Status: Abnormal   Collection Time: 02/03/22  8:22 PM  Result Value Ref Range   Lactic Acid, Venous 3.7 (HH) 0.5 - 1.9 mmol/L    Comment: CRITICAL RESULT CALLED TO, READ BACK BY AND VERIFIED WITH: TONY WALKER @ 2048 ON 02/03/22 C VARNER Performed at [facility], [address], [place], [place] [address]   CBC with Differential     Status: Abnormal   Collection Time: 02/03/22  8:22 PM  Result Value Ref Range   WBC 22.3 (H) 4.0 - 10.5 K/uL   RBC 3.97 3.87 - 5.11 MIL/uL   Hemoglobin 12.8 12.0 - 15.0 g/dL   HCT 40.8 36.0 - 46.0 %   MCV 102.8 (H) 80.0 - 100.0 fL   MCH 32.2 26.0 - 34.0 pg   MCHC 31.4 30.0 - 36.0 g/dL   RDW 14.9 11.5 - 15.5 %   Platelets 145 (L) 150 - 400 K/uL   nRBC 0.0 0.0 - 0.2 %   Neutrophils Relative % 63 %   Neutro Abs 19.6 (H) 1.7 - 7.7 K/uL   Band Neutrophils 25 %   Lymphocytes Relative 10 %   Lymphs Abs 2.2 0.7 - 4.0 K/uL   Monocytes Relative 2 %   Monocytes Absolute 0.4 0.1 - 1.0 K/uL   Eosinophils Relative 0 %   Eosinophils Absolute 0.0 0.0 - 0.5 K/uL   Basophils Relative 0 %   Basophils Absolute 0.0 0.0 - 0.1 K/uL   RBC Morphology MORPHOLOGY UNREMARKABLE    Smear Review MORPHOLOGY UNREMARKABLE     Comment: Performed at [facility], [address], [place], [place] [address]  Protime-INR     Status: Abnormal   Collection Time: 02/03/22  8:22 PM  Result Value Ref Range   Prothrombin  Time 19.3 (H) 11.4 - 15.2 seconds   INR 1.6 (H) 0.8 - 1.2    Comment: (NOTE) INR goal varies based on device and disease states. Performed at [facility], [address], [place], [place] [address]   Culture, blood (Routine x 2)     Status: None (Preliminary result)   Collection Time: 02/03/22  8:22 PM   Specimen: BLOOD RIGHT HAND  Result Value Ref Range   Specimen Description BLOOD RIGHT HAND    Special Requests      BOTTLES DRAWN AEROBIC AND ANAEROBIC Blood Culture results may not be optimal due to an inadequate volume of blood received in culture bottles   Culture  Setup Time      GRAM NEGATIVE RODS IN BOTH AEROBIC AND ANAEROBIC BOTTLES Gram Stain Report Called to,Read Back By and Verified With: LOOMIS @ [id number] ON [id number] BY HENDERSON L    Culture      NO GROWTH < 12 HOURS Performed at [facility], [address], [place], [place] [address]    Report Status PENDING   Culture, blood (Routine x 2)     Status: None (Preliminary result)   Collection Time: 02/03/22  8:22 PM   Specimen: Left Antecubital; Blood  Result Value Ref Range   Specimen Description LEFT ANTECUBITAL    Special Requests      BOTTLES DRAWN AEROBIC AND ANAEROBIC Blood Culture adequate volume   Culture  Setup Time      GRAM NEGATIVE RODS IN BOTH AEROBIC AND ANAEROBIC BOTTLES Gram Stain Report Called to,Read Back By and Verified With: LOOMIS @ [id number] ON [id number] BY HENDERSON L    Culture      NO GROWTH < 12 HOURS Performed at [facility], [address] [place], [place] [address]    Report Status PENDING   APTT     Status: None   Collection Time: 02/03/22  8:22 PM  Result Value Ref Range   aPTT 32 24 - 36 seconds    Comment: Performed at [facility], [address], [place], [place] [address]  Folate     Status: None   Collection Time: 02/03/22  8:22 PM   Result Value Ref Range   Folate 8.3 >5.9 ng/mL    Comment: Performed at [facility], [address] [place], [place] [address]  Hemoglobin A1c     Status: Abnormal   Collection Time: 02/03/22  8:22 PM  Result Value Ref Range   Hgb A1c MFr Bld 6.5 (H) 4.8 - 5.6 %    Comment: (NOTE) Pre diabetes:          5.7%-6.4%  Diabetes:              >6.4%  Glycemic control for   <7.0% adults with diabetes    Mean Plasma Glucose 139.85 mg/dL    Comment: Performed at [facility], [place] [address], [place], [place] [address]  Vitamin B12     Status: Abnormal   Collection Time: 02/03/22  8:22 PM  Result Value Ref Range   Vitamin B-12 80 (L) 180 - 914 pg/mL    Comment: (NOTE) This assay is not validated for testing neonatal or myeloproliferative syndrome specimens for Vitamin B12 levels. Performed at [facility], [address] [place], [place] [address]   Resp Panel by RT-PCR (Flu A&B, Covid) Nasopharyngeal Swab     Status: None   Collection Time: 02/03/22  9:31 PM   Specimen: Nasopharyngeal Swab; Nasopharyngeal(NP) swabs in vial transport  medium  Result Value Ref Range   SARS Coronavirus 2 by RT PCR NEGATIVE NEGATIVE    Comment: (NOTE) SARS-CoV-2 target nucleic acids are NOT DETECTED.  The SARS-CoV-2 RNA is generally detectable in upper respiratory specimens during the acute phase of infection. The lowest concentration of SARS-CoV-2 viral copies this assay can detect is 138 copies/mL. A negative result does not preclude SARS-Cov-2 infection and should not be used as the sole basis for treatment or other patient management decisions. A negative result may occur with  improper specimen collection/handling, submission of specimen other than nasopharyngeal swab, presence of viral mutation(s) within the areas targeted by this assay, and inadequate number of viral copies(<138 copies/mL). A negative result must be combined with clinical observations, patient history, and  epidemiological information. The expected result is Negative.  Fact Sheet for Patients:  EntrepreneurPulse.com.au  Fact Sheet for Healthcare Providers:  IncredibleEmployment.be  This test is no t yet approved or cleared by the [place] FDA and  has been authorized for detection and/or diagnosis of SARS-CoV-2 by FDA under an Emergency Use Authorization (EUA). This EUA will remain  in effect (meaning this test can be used) for the duration of the COVID-19 declaration under Section 564(b)(1) of the Act, 21 U.S.C.section 360bbb-3(b)(1), unless the authorization is terminated  or revoked sooner.       Influenza A by PCR NEGATIVE NEGATIVE   Influenza B by PCR NEGATIVE NEGATIVE    Comment: (NOTE) The Xpert Xpress SARS-CoV-2/FLU/RSV plus assay is intended as an aid in the diagnosis of influenza from Nasopharyngeal swab specimens and should not be used as a sole basis for treatment. Nasal washings and aspirates are unacceptable for Xpert Xpress SARS-CoV-2/FLU/RSV testing.  Fact Sheet for Patients: EntrepreneurPulse.com.au  Fact Sheet for Healthcare Providers: IncredibleEmployment.be  This test is not yet approved or cleared by the [place] FDA and has been authorized for detection and/or diagnosis of SARS-CoV-2 by FDA under an Emergency Use Authorization (EUA). This EUA will remain in effect (meaning this test can be used) for the duration of the COVID-19 declaration under Section 564(b)(1) of the Act, 21 U.S.C. section 360bbb-3(b)(1), unless the authorization is terminated or revoked.  Performed at [facility], [address] [place], [place] [address]   Lactic acid, plasma     Status: Abnormal   Collection Time: 02/03/22 10:22 PM  Result Value Ref Range   Lactic Acid, Venous 4.1 (HH) 0.5 - 1.9 mmol/L    Comment: CRITICAL VALUE NOTED.  VALUE IS CONSISTENT WITH PREVIOUSLY REPORTED AND CALLED  VALUE. Performed at [facility], [address], [place], [place] [address]   Lactic acid, plasma     Status: Abnormal   Collection Time: 02/04/22 12:14 AM  Result Value Ref Range   Lactic Acid, Venous 2.3 (HH) 0.5 - 1.9 mmol/L    Comment: CRITICAL RESULT CALLED TO, READ BACK BY AND VERIFIED WITH: Coe,A@0135  by Matthews,B 2.10.23 Performed at [facility], [address] [place], [place] [address]   MRSA Next Gen by PCR, Nasal     Status: None   Collection Time: 02/04/22  2:58 AM  Result Value Ref Range   MRSA by PCR Next Gen NOT DETECTED NOT DETECTED    Comment: (NOTE) The GeneXpert MRSA Assay (FDA approved for NASAL specimens only), is one component of a comprehensive MRSA colonization surveillance program. It is not intended to diagnose MRSA infection nor to guide or monitor treatment for MRSA infections. Test performance is not FDA approved in patients less  than 39 years old. Performed at [facility], [address] [place], [place] [address]   Urinalysis, Routine w reflex microscopic Urine, Clean Catch     Status: Abnormal   Collection Time: 02/04/22  3:10 AM  Result Value Ref Range   Color, Urine AMBER (A) YELLOW    Comment: BIOCHEMICALS MAY BE AFFECTED BY COLOR   APPearance HAZY (A) CLEAR   Specific Gravity, Urine 1.020 1.005 - 1.030   pH 5.0 5.0 - 8.0   Glucose, UA 50 (A) NEGATIVE mg/dL   Hgb urine dipstick SMALL (A) NEGATIVE   Bilirubin Urine NEGATIVE NEGATIVE   Ketones, ur NEGATIVE NEGATIVE mg/dL   Protein, ur 100 (A) NEGATIVE mg/dL   Nitrite NEGATIVE NEGATIVE   Leukocytes,Ua NEGATIVE NEGATIVE   RBC / HPF 0-5 0 - 5 RBC/hpf   WBC, UA 21-50 0 - 5 WBC/hpf   Bacteria, UA RARE (A) NONE SEEN   Squamous Epithelial / LPF 0-5 0 - 5   WBC Clumps PRESENT    Mucus PRESENT    Hyaline Casts, UA PRESENT    Non Squamous Epithelial 0-5 (A) NONE SEEN    Comment: Performed at [facility], [address], [place], [place] [address]  Glucose, capillary     Status: Abnormal    Collection Time: 02/04/22  3:29 AM  Result Value Ref Range   Glucose-Capillary 144 (H) 70 - 99 mg/dL    Comment: Glucose reference range applies only to samples taken after fasting for at least 8 hours.  Cortisol-am, blood     Status: None   Collection Time: 02/04/22  3:34 AM  Result Value Ref Range   Cortisol - AM 20.7 6.7 - 22.6 ug/dL    Comment: Performed at [facility], [place] [address] [place], [place] [address]  Procalcitonin     Status: None   Collection Time: 02/04/22  3:34 AM  Result Value Ref Range   Procalcitonin 15.12 ng/mL    Comment:        Interpretation: PCT >= 10 ng/mL: Important systemic inflammatory response, almost exclusively due to severe bacterial sepsis or septic shock. (NOTE)       Sepsis PCT Algorithm           Lower Respiratory Tract                                      Infection PCT Algorithm    ----------------------------     ----------------------------         PCT < 0.25 ng/mL                PCT < 0.10 ng/mL          Strongly encourage             Strongly discourage   discontinuation of antibiotics    initiation of antibiotics    ----------------------------     -----------------------------       PCT 0.25 - 0.50 ng/mL            PCT 0.10 - 0.25 ng/mL               OR       >80% decrease in PCT            Discourage initiation of  antibiotics      Encourage discontinuation           of antibiotics    ----------------------------     -----------------------------         PCT >= 0.50 ng/mL              PCT 0.26 - 0.50 ng/mL                AND       <80% decrease in PCT             Encourage initiation of                                             antibiotics       Encourage continuation           of antibiotics    ----------------------------     -----------------------------        PCT >= 0.50 ng/mL                  PCT > 0.50 ng/mL               AND         increase in PCT                   Strongly encourage                                      initiation of antibiotics    Strongly encourage escalation           of antibiotics                                     -----------------------------                                           PCT <= 0.25 ng/mL                                                 OR                                        > 80% decrease in PCT                                      Discontinue / Do not initiate                                             antibiotics  Performed at [facility], [address], [place], [place] [address]   CBC     Status: Abnormal   Collection Time: 02/04/22  3:34 AM  Result Value Ref Range   WBC 20.3 (H) 4.0 - 10.5 K/uL   RBC 3.36 (L) 3.87 - 5.11 MIL/uL   Hemoglobin 10.8 (L) 12.0 - 15.0 g/dL   HCT 35.1 (L) 36.0 - 46.0 %   MCV 104.5 (H) 80.0 - 100.0 fL   MCH 32.1 26.0 - 34.0 pg   MCHC 30.8 30.0 - 36.0 g/dL   RDW 15.0 11.5 - 15.5 %   Platelets 114 (L) 150 - 400 K/uL    Comment: SPECIMEN CHECKED FOR CLOTS Immature Platelet Fraction may be clinically indicated, consider ordering this additional test [id number] PLATELET COUNT CONFIRMED BY SMEAR    nRBC 0.0 0.0 - 0.2 %    Comment: Performed at [facility], [address] [place], [place] [address]  Comprehensive metabolic panel     Status: Abnormal   Collection Time: 02/04/22  3:34 AM  Result Value Ref Range   Sodium 141 135 - 145 mmol/L   Potassium 4.3 3.5 - 5.1 mmol/L   Chloride 110 98 - 111 mmol/L   CO2 24 22 - 32 mmol/L   Glucose, Bld 159 (H) 70 - 99 mg/dL    Comment: Glucose reference range applies only to samples taken after fasting for at least 8 hours.   BUN 26 (H) 8 - 23 mg/dL   Creatinine, Ser 1.69 (H) 0.44 - 1.00 mg/dL   Calcium 7.8 (L) 8.9 - 10.3 mg/dL   Total Protein 6.3 (L) 6.5 - 8.1 g/dL   Albumin 2.7 (L) 3.5 - 5.0 g/dL   AST 219 (H) 15 - 41 U/L   ALT 223 (H) 0 - 44 U/L   Alkaline Phosphatase 124 Natalie - 126 U/L   Total Bilirubin 1.9 (H) 0.3 - 1.2  mg/dL   GFR, Estimated 31 (L) >60 mL/min    Comment: (NOTE) Calculated using the CKD-EPI Creatinine Equation (2021)    Anion gap 7 5 - 15    Comment: Performed at [facility], [address], [place], [place] [address]  Magnesium     Status: Abnormal   Collection Time: 02/04/22  3:34 AM  Result Value Ref Range   Magnesium 1.5 (L) 1.7 - 2.4 mg/dL    Comment: Performed at [facility], [address], [place], [place] [address]  Phosphorus     Status: None   Collection Time: 02/04/22  3:34 AM  Result Value Ref Range   Phosphorus 3.9 2.5 - 4.6 mg/dL    Comment: Performed at [facility], [address], [place], [place] [address]  Glucose, capillary     Status: Abnormal   Collection Time: 02/04/22  7:39 AM  Result Value Ref Range   Glucose-Capillary 113 (H) 70 - 99 mg/dL    Comment: Glucose reference range applies only to samples taken after fasting for at least 8 hours.    CT ABDOMEN PELVIS WO CONTRAST  Result Date: 02/03/2022 CLINICAL DATA:  Abdominal pain, acute, nonlocalized. Emesis and diarrhea beginning last night. Generalized weakness. Gallbladder drain in place. EXAM: CT ABDOMEN AND PELVIS WITHOUT CONTRAST TECHNIQUE: Multidetector CT imaging of the abdomen and pelvis was performed following the standard protocol without IV contrast. RADIATION DOSE REDUCTION: This exam was performed according to the departmental dose-optimization program which includes automated exposure control, adjustment of the mA and/or kV according to patient size and/or use of iterative reconstruction technique. COMPARISON:  10/30/2021 FINDINGS: Lower chest: Mild atelectasis and or scarring at the lung bases as seen previously. Cardiomegaly. Pacemaker in place. Hepatobiliary:  Liver cysts seen in the right lobe, unchanged. Percutaneous drain in the gallbladder fossa without evidence of fluid collection or ongoing inflammatory change. Is this drain being actively managed? Pancreas: Normal Spleen: Normal  Adrenals/Urinary Tract: Adrenal glands are normal. Kidneys are normal. Bladder is normal. Stomach/Bowel: Stomach and small intestine are normal. Normal appendix. Ordinary diverticulosis of the left colon without evidence of diverticulitis. Vascular/Lymphatic: Aortic atherosclerosis. No aneurysm. IVC is normal. No adenopathy. Reproductive: No pelvic mass. Other: No free fluid or air. Musculoskeletal: Ordinary spinal curvature and degenerative change. IMPRESSION: Drain remains in place in the gallbladder fossa region. No evidence of abscess or residual inflammation in that region. Is this catheter being actively managed? Aortic Atherosclerosis (ICD10-I70.0). No other significant or acute finding. Electronically Signed   By: Nelson Chimes M.D.   On: 02/03/2022 22:25   DG Chest 2 View  Result Date: 02/03/2022 CLINICAL DATA:  Suspected sepsis.  Vomiting, diarrhea, weakness EXAM: CHEST - 2 VIEW COMPARISON:  08/22/2021 FINDINGS: Cardiac pacemaker. Heart size and pulmonary vascularity are normal. Lungs are clear. No pleural effusions. No pneumothorax. Mediastinal contours appear intact. Calcification of the aorta. Degenerative changes in the spine and shoulders. Pigtail drainage catheter in the right upper quadrant. IMPRESSION: No active cardiopulmonary disease. Electronically Signed   By: Lucienne Capers M.D.   On: 02/03/2022 19:59    ROS:  Pertinent items are noted in HPI.  Blood pressure (!) 135/101, pulse 92, temperature 98.5 F (36.9 C), temperature source Oral, resp. rate 15, height 5\' 5"  (1.651 m), weight 88.9 kg, SpO2 95 %. Physical Exam: Pleasant black Castro no acute distress Head is normocephalic, atraumatic Eyes are without scleral icterus Abdomen is soft with no particular tenderness noted.  No rigidity is noted.  Clear yellowish fluid is present in the cholecystostomy bag.  It apparently had been recently flushed.  CT scan results reviewed.  Labs reviewed.  Assessment/Plan: Impression: Sepsis  of unknown etiology, most likely urinary in nature.  Her transaminitis may be secondary to shock liver.  She does not have purulent drainage in the cholecystostomy tube.  It is well-positioned as per CT scan of the abdomen.  It flushes well.  Hepatobiliary source of her sepsis less likely. Plan: Would monitor liver enzyme test.  No need for repositioning of the cholecystostomy tube at this time.  Discussed with Dr. Carles Collet.  Aviva Signs 02/04/2022, 9:15 AM

## 2022-02-04 NOTE — Assessment & Plan Note (Deleted)
MCV 102.8

## 2022-02-04 NOTE — Hospital Course (Addendum)
81 year old female with a history of HFpEF, coronary disease, diabetes mellitus type 2, complete heart block status post permanent pacemaker, hypertension, hyperlipidemia, pulmonary embolus on apixaban, and chronic cholecystitis presenting with 2-day history of abdominal pain and generalized weakness.  The patient is difficult historian at best.  History is supplemented by the patient's daughter and review of the medical record.  Apparently, the patient had a couple episodes of nausea and vomiting on 02/03/2022 with some loose stool.  There is no reports of hematochezia or melena.  The patient had subjective fevers and chills.  The patient denies any headache, chest pain, coughing, shortness of breath, hemoptysis.  She states that she has had some dysuria.  As result, the patient was brought to the emergency department for further evaluation.  The patient has had generalized weakness for about what she had difficulty getting out of bed.  At baseline, the patient is able to ambulate with a walker.  Her daughter states that she usually flushes the cholecystotomy tube 2 times per week.  There has not been any difficulty flushing it. Notably, the patient was admitted to the hospital on 10/30/2021 to 11/05/2021 for recurrent acute acalculous cholecystitis.  A percutaneous cholecystotomy tube was again placed on 11/04/2021.  Notably, the patient also had a previous cholecystotomy tube placed on 07/08/2021.  It was removed on 09/22/2021 after 1 month capping trial. In the emergency department, the patient had was hypotensive with a blood pressure 87/53.  BMP showed sodium 141, potassium 4.1, bicarbonate 23, serum creatinine 2.11.  WBC 22.3, hemoglobin 12.8, platelets 1-45,000.  CT of the abdomen and pelvis showed bibasilar scarring and atelectasis.  There was an unchanged percutaneous drain in the gallbladder fossa without evidence of fluid collection or ongoing inflammation.  The patient was started on aztreonam and  metronidazole in the ED.  Lactic acid peaked 4.1.  AST 372, ALT 301, alk phosphatase 157, total bilirubin 3.0.  She was admitted for further evaluation and treatment of sepsis.  Upon arrival to the medical floor, the patient was started on cefepime and metronidazole.  General surgery was consulted to assist with management whom recommended continuing nonoperative management as CT abd is re-assuring.

## 2022-02-04 NOTE — Progress Notes (Signed)
eLink Physician-Brief Progress Note Patient Name: Natalie Castro DOB: [birth year] MRN: [id number]   Date of Service  02/04/2022  HPI/Events of Note  Patient admitted with hypotension, leukocytosis, lactic acidosis, acute kidney injury in the context of nausea and vomiting prior to hospitalization, she has a percutaneous cholecystostomy for chronic cholecystitis but abdominal CT imaging does not demonstrate clear cut evidence of biliary tract origin of sepsis.  eICU Interventions  New Patient Evaluation.        Bernardine Langworthy U Meaghan Whistler 02/04/2022, 3:20 AM

## 2022-02-04 NOTE — Assessment & Plan Note (Addendum)
Appears clinically euvolemic Restarted lasix on discharge

## 2022-02-04 NOTE — Assessment & Plan Note (Addendum)
Present on admission Patient presented with leukocytosis, tachycardia, and hypotension and acute kidney injury Lactic acid 4.1>> 2.3 Secondary to hepatobiliary source and urinary source Initially on cefepime and metronidazole 2/11>>transitioned to [facility] pending final culture data>>>de-escalated to ceftriaxone Sepsis physiology resolved 02/09/22--no fevers x >48 hrs

## 2022-02-04 NOTE — Assessment & Plan Note (Addendum)
Holding statin secondary to elevated LFTs>>restart after dc as LFTs improved

## 2022-02-04 NOTE — Assessment & Plan Note (Addendum)
Baseline Cr at 0.7-0.9 Presented with serum creatinine 2.11 Secondary to sepsis and hemodynamic changes Hold losartan Overall improved with IV fluids>>serum creatinine 0.64 on 2/14

## 2022-02-04 NOTE — Progress Notes (Signed)
Received a call from [facility] from microbiology [facility] & given blood cultures results positive for Ecoli with no resistance detected, reported that she called to report to pharmacy multiple times & never got an answer, MD notified

## 2022-02-04 NOTE — TOC Initial Note (Signed)
Transition of Care [facility]) - Initial/Assessment Note    Patient Details  Name: Natalie Castro MRN: [id number] Date of Birth: 17-Jul-1941  [facility] CM/SW Contact:    Iona Beard, [place] Phone Number: 02/04/2022, 1:48 PM  Clinical Narrative:                 Pt is high risk for readmission. CSW spoke with pts daughter Judi Cong to complete assessment. Pt lives with her other daughter. Pt is independent in completing her ADLS. Pt typically has family provide transportation as needed. Pt has a rollator and wheelchair to use in the home as needed. Pt has had HH in the past, pts daughter believes it was done through [facility]. TOC to follow for D/C needs.   Expected Discharge Plan: Home/Self Care Barriers to Discharge: Continued Medical Work up   Patient Goals and CMS Choice Patient states their goals for this hospitalization and ongoing recovery are:: Return home CMS Medicare.gov Compare Post Acute Care list provided to:: Patient Choice offered to / list presented to : Patient, Adult Children  Expected Discharge Plan and Services Expected Discharge Plan: Home/Self Care In-house Referral: Clinical Social Work Discharge Planning Services: CM Consult   Living arrangements for the past 2 months: [place]                                      Prior Living Arrangements/Services Living arrangements for the past 2 months: Single Family Home Lives with:: Adult Children Patient language and need for interpreter reviewed:: Yes Do you feel safe going back to the place where you live?: Yes      Need for Family Participation in Patient Care: Yes (Comment) Care giver support system in place?: Yes (comment) Current home services: DME Criminal Activity/Legal Involvement Pertinent to Current Situation/Hospitalization: No - Comment as needed  Activities of Daily Living Home Assistive Devices/Equipment: Walker (specify type) ADL Screening (condition at time of  admission) Patient's cognitive ability adequate to safely complete daily activities?: Yes Is the patient deaf or have difficulty hearing?: No Does the patient have difficulty seeing, even when wearing glasses/contacts?: No Does the patient have difficulty concentrating, remembering, or making decisions?: Yes Patient able to express need for assistance with ADLs?: Yes Does the patient have difficulty dressing or bathing?: Yes Independently performs ADLs?: Yes (appropriate for developmental age) Does the patient have difficulty walking or climbing stairs?: Yes Weakness of Legs: Both Weakness of Arms/Hands: None  Permission Sought/Granted                  Emotional Assessment Appearance:: Appears stated age     Orientation: : Oriented to Self, Oriented to Place, Oriented to  Time, Oriented to Situation Alcohol / Substance Use: Not Applicable Psych Involvement: No (comment)  Admission diagnosis:  Chronic cholecystitis [K81.1] Septic shock ([place]) [A41.9, R65.21] Sepsis due to undetermined organism [facility]) [A41.9] Patient Active Problem List   Diagnosis Date Noted   Severe sepsis ([place]) 02/04/2022   Acute on chronic cholecystitis 02/04/2022   Thrombocytopenia ([place]) 02/04/2022   Lactic acidosis 02/04/2022   Transaminitis 02/04/2022   Elevated MCV 02/04/2022   Pyuria 02/04/2022   Class 1 obesity 02/04/2022   B12 deficiency 02/04/2022   Abdominal pain, right upper quadrant    Acute cholecystitis without calculus 10/30/2021   Acute exacerbation of CHF (congestive heart failure) ([place]) 08/21/2021   Chronic diastolic CHF (congestive  heart failure) ([place]) 08/21/2021   Bacteremia 07/05/2021   AKI (acute kidney injury) ([place]) 07/05/2021   Pancreatic duct dilated    Protein-calorie malnutrition, mild ([place]) 07/02/2021   Chest pain 07/02/2021   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   Uncontrolled type 2 diabetes mellitus with hyperglycemia ([place]) 04/06/2020   Mixed hyperlipidemia 11/04/2019   Need for  immunization against influenza 11/04/2019   Positive FIT (fecal immunochemical test) 04/26/2018   History of pulmonary embolus (PE) 11/04/2016   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 [place] [address] Phone: [phone] Fax: [phone]     Social Determinants of Health (SDOH) Interventions    Readmission Risk Interventions Readmission Risk Prevention Plan 07/11/2021  Transportation Screening Complete  PCP or Specialist Appt within 5-7 Days Complete  Home Care Screening Complete  Medication Review (RN CM) Complete  Some recent data might be hidden

## 2022-02-04 NOTE — Assessment & Plan Note (Addendum)
Due to sepsis and hemodynamic changes Overall improved

## 2022-02-04 NOTE — Progress Notes (Signed)
PROGRESS NOTE  Natalie Castro BTD:[id number] DOB: [birth year] DOA: 02/03/2022 PCP: [facility]  Brief History:  81 year old female with a history of HFpEF, coronary disease, diabetes mellitus type 2, complete heart block status post permanent pacemaker, hypertension, hyperlipidemia, pulmonary embolus on apixaban, and chronic cholecystitis presenting with 2-day history of abdominal pain and generalized weakness.  The patient is difficult historian at best.  History is supplemented by the patient's daughter and review of the medical record.  Apparently, the patient had a couple episodes of nausea and vomiting on 02/03/2022 with some loose stool.  There is no reports of hematochezia or melena.  The patient had subjective fevers and chills.  The patient denies any headache, chest pain, coughing, shortness of breath, hemoptysis.  She states that she has had some dysuria.  As result, the patient was brought to the emergency department for further evaluation.  The patient has had generalized weakness for about what she had difficulty getting out of bed.  At baseline, the patient is able to ambulate with a walker.  Her daughter states that she usually flushes the cholecystotomy tube 2 times per week.  There has not been any difficulty flushing it. Notably, the patient was admitted to the hospital on 10/30/2021 to 11/05/2021 for recurrent acute acalculous cholecystitis.  A percutaneous cholecystotomy tube was again placed on 11/04/2021.  Notably, the patient also had a previous cholecystotomy tube placed on 07/08/2021.  It was removed on 09/22/2021 after 1 month capping trial. In the emergency department, the patient had was hypotensive with a blood pressure 87/53.  BMP showed sodium 141, potassium 4.1, bicarbonate 23, serum creatinine 2.11.  WBC 22.3, hemoglobin 12.8, platelets 1-45,000.  CT of the abdomen and pelvis showed bibasilar scarring and atelectasis.  There was an unchanged  percutaneous drain in the gallbladder fossa without evidence of fluid collection or ongoing inflammation.  The patient was started on aztreonam and metronidazole in the ED.  Lactic acid peaked 4.1.  AST 372, ALT 301, alk phosphatase 157, total bilirubin 3.0.  She was admitted for further evaluation and treatment of sepsis.  Upon arrival to the medical floor, the patient was started on cefepime and metronidazole.  General surgery was consulted to assist with management.     Assessment and Plan: * Severe sepsis ([place])- (present on admission) Present on admission Patient presented with leukocytosis, tachycardia, and hypotension and acute kidney injury Lactic acid 4.1>> 2.3 Secondary to hepatobiliary source and urinary source Start cefepime and metronidazole  Acute on chronic cholecystitis- (present on admission) Continue metronidazole and cefepime Cholecystotomy tube previously placed 11/04/2021 General surgery consult 02/03/2022 CT abdomen as discussed above  AKI (acute kidney injury) ([place])- (present on admission) Baseline Cr at 0.7-0.9 Presented with serum creatinine 2.11 Secondary to sepsis and hemodynamic changes Hold losartan Continue IV fluids  Pyuria- (present on admission) Concerned about UTI given the patient's dysuria and pyuria UA 21-50 WBC Follow urine culture  Thrombocytopenia ([place])- (present on admission) Platelets 145 at the time of admission Secondary to sepsis Anticipate improvement with treatment of infectious process  Chronic diastolic CHF (congestive heart failure) ([place])- (present on admission) Appears clinically euvolemic Holding furosemide temporarily due to hypotension and sepsis  B12 deficiency Give 1 dose of IM B12 Start oral supplementation  Class 1 obesity- (present on admission) BMI 32.61 Lifestyle modification  Transaminitis- (present on admission) Elevated liver enzymes   Mixed hyperlipidemia- (present on admission) Holding statin secondary  to elevated  LFTs  History of pulmonary embolus (PE)- (present on admission) Holding apixaban temporarily in anticipation for any procedures  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 [name] in the outpatient setting  Essential hypertension, benign- (present on admission) Holding nifedipine, hydralazine, losartan, metoprolol succinate secondary to hypotension Reintroduce antihypertensive medications as BP improves  Controlled type 2 diabetes mellitus without complication, without long-term current use of insulin ([place]) 10/30/2021 hemoglobin A1c 6.8 NovoLog sliding scale Holding metformin         Status is: Inpatient Remains inpatient appropriate because: Severity of illness with hemodynamic instability and requirement for intravenous antibiotics        The patient is critically ill with multiple organ systems failure and requires high complexity decision making for assessment and support, frequent evaluation and titration of therapies, application of advanced monitoring technologies and extensive interpretation of multiple databases.  Critical care time - 45 mins.      Family Communication:   Daughter was updated at bedside 2/10  Consultants: General surgery  Code Status:  FULL   DVT Prophylaxis:  [place] Lovenox   Procedures: As Listed in Progress Note Above  Antibiotics: Cefepime 2/10>> Metronidazole 2/10>>      Subjective: Patient states that she has some upper abdominal pain in the epigastric region.  It is a little bit better than yesterday.  She has some nausea but denies any emesis.  There is no further diarrhea.  She denies any chest pain, coughing, hemoptysis, shortness of breath, headache, neck pain.  Objective: Vitals:   02/04/22 0400 02/04/22 0500 02/04/22 0600 02/04/22 0742  BP: (!) 152/87 (!) 145/50 (!) 166/65   Pulse: 76 72 73   Resp: $Remo'14 17 18   'wADnU$ Temp:    98.5 F (36.9 C)  TempSrc:    Oral   SpO2: 98% 95% 96%   Weight:      Height:        Intake/Output Summary (Last 24 hours) at 02/04/2022 6720 Last data filed at 02/04/2022 9470 Gross per 24 hour  Intake 65.75 ml  Output 150 ml  Net -84.25 ml   Weight change:  Exam:  General:  Pt is alert, follows commands appropriately, not in acute distress HEENT: No icterus, No thrush, No neck mass, [place]/AT Cardiovascular: RRR, S1/S2, no rubs, no gallops Respiratory: Fine bibasilar crackles but no wheezing.  Good air movement. Abdomen: Soft/+BS, epigastric and periumbilical tender, non distended, no guarding Extremities: No edema, No lymphangitis, No petechiae, No rashes, no synovitis   Data Reviewed: I have personally reviewed following labs and imaging studies Basic Metabolic Panel: Recent Labs  Lab 02/03/22 2022 02/04/22 0334  NA 141 141  K 4.1 4.3  CL 103 110  CO2 23 24  GLUCOSE 121* 159*  BUN 22 26*  CREATININE 2.11* 1.69*  CALCIUM 8.8* 7.8*  MG  --  1.5*  PHOS  --  3.9   Liver Function Tests: Recent Labs  Lab 02/03/22 2022 02/04/22 0334  AST 372* 219*  ALT 301* 223*  ALKPHOS 157* 124  BILITOT 3.0* 1.9*  PROT 7.7 6.3*  ALBUMIN 3.4* 2.7*   No results for input(s): LIPASE, AMYLASE in the last 168 hours. No results for input(s): AMMONIA in the last 168 hours. Coagulation Profile: Recent Labs  Lab 02/03/22 2022  INR 1.6*   CBC: Recent Labs  Lab 02/03/22 2022 02/04/22 0334  WBC 22.3* 20.3*  NEUTROABS 19.6*  --   HGB 12.8 10.8*  HCT 40.8 35.1*  MCV  102.8* 104.5*  PLT 145* 114*   Cardiac Enzymes: No results for input(s): CKTOTAL, CKMB, CKMBINDEX, TROPONINI in the last 168 hours. BNP: Invalid input(s): POCBNP CBG: Recent Labs  Lab 02/04/22 0329 02/04/22 0739  GLUCAP 144* 113*   HbA1C: Recent Labs    02/03/22 2022  HGBA1C 6.5*   Urine analysis:    Component Value Date/Time   COLORURINE AMBER (A) 02/04/2022 0310   APPEARANCEUR HAZY (A) 02/04/2022 0310   LABSPEC 1.020 02/04/2022  0310   PHURINE 5.0 02/04/2022 0310   GLUCOSEU 50 (A) 02/04/2022 0310   HGBUR SMALL (A) 02/04/2022 0310   BILIRUBINUR NEGATIVE 02/04/2022 0310   KETONESUR NEGATIVE 02/04/2022 0310   PROTEINUR 100 (A) 02/04/2022 0310   UROBILINOGEN 0.2 06/18/2015 2300   NITRITE NEGATIVE 02/04/2022 0310   LEUKOCYTESUR NEGATIVE 02/04/2022 0310   Sepsis Labs: $RemoveBefo'@LABRCNTIP'XRxhjpCBwma$ (procalcitonin:4,lacticidven:4) ) Recent Results (from the past 240 hour(s))  Culture, blood (Routine x 2)     Status: None (Preliminary result)   Collection Time: 02/03/22  8:22 PM   Specimen: BLOOD RIGHT HAND  Result Value Ref Range Status   Specimen Description BLOOD RIGHT HAND  Final   Special Requests   Final    BOTTLES DRAWN AEROBIC AND ANAEROBIC Blood Culture results may not be optimal due to an inadequate volume of blood received in culture bottles   Culture   Final    NO GROWTH < 12 HOURS Performed at [facility], [address], [place], [place] [address]    Report Status PENDING  Incomplete  Culture, blood (Routine x 2)     Status: None (Preliminary result)   Collection Time: 02/03/22  8:22 PM   Specimen: Left Antecubital; Blood  Result Value Ref Range Status   Specimen Description LEFT ANTECUBITAL  Final   Special Requests   Final    BOTTLES DRAWN AEROBIC AND ANAEROBIC Blood Culture adequate volume   Culture   Final    NO GROWTH < 12 HOURS Performed at [facility], [address], [place],  [address]    Report Status PENDING  Incomplete  Resp Panel by RT-PCR (Flu A&B, Covid) Nasopharyngeal Swab     Status: None   Collection Time: 02/03/22  9:31 PM   Specimen: Nasopharyngeal Swab; Nasopharyngeal(NP) swabs in vial transport medium  Result Value Ref Range Status   SARS Coronavirus 2 by RT PCR NEGATIVE NEGATIVE Final    Comment: (NOTE) SARS-CoV-2 target nucleic acids are NOT DETECTED.  The SARS-CoV-2 RNA is generally detectable in upper respiratory specimens during the acute phase of infection. The  lowest concentration of SARS-CoV-2 viral copies this assay can detect is 138 copies/mL. A negative result does not preclude SARS-Cov-2 infection and should not be used as the sole basis for treatment or other patient management decisions. A negative result may occur with  improper specimen collection/handling, submission of specimen other than nasopharyngeal swab, presence of viral mutation(s) within the areas targeted by this assay, and inadequate number of viral copies(<138 copies/mL). A negative result must be combined with clinical observations, patient history, and epidemiological information. The expected result is Negative.  Fact Sheet for Patients:  EntrepreneurPulse.com.au  Fact Sheet for Healthcare Providers:  IncredibleEmployment.be  This test is no t yet approved or cleared by the [place] FDA and  has been authorized for detection and/or diagnosis of SARS-CoV-2 by FDA under an Emergency Use Authorization (EUA). This EUA will remain  in effect (meaning this test can be used) for the duration of the COVID-19 declaration  under Section 564(b)(1) of the Act, 21 U.S.C.section 360bbb-3(b)(1), unless the authorization is terminated  or revoked sooner.       Influenza A by PCR NEGATIVE NEGATIVE Final   Influenza B by PCR NEGATIVE NEGATIVE Final    Comment: (NOTE) The Xpert Xpress SARS-CoV-2/FLU/RSV plus assay is intended as an aid in the diagnosis of influenza from Nasopharyngeal swab specimens and should not be used as a sole basis for treatment. Nasal washings and aspirates are unacceptable for Xpert Xpress SARS-CoV-2/FLU/RSV testing.  Fact Sheet for Patients: EntrepreneurPulse.com.au  Fact Sheet for Healthcare Providers: IncredibleEmployment.be  This test is not yet approved or cleared by the [place] FDA and has been authorized for detection and/or diagnosis of SARS-CoV-2 by FDA under  an Emergency Use Authorization (EUA). This EUA will remain in effect (meaning this test can be used) for the duration of the COVID-19 declaration under Section 564(b)(1) of the Act, 21 U.S.C. section 360bbb-3(b)(1), unless the authorization is terminated or revoked.  Performed at [facility], [address], [place], [place] [address]      Scheduled Meds:  Chlorhexidine Gluconate Cloth  6 each Topical Q0600   cyanocobalamin  1,000 mcg Intramuscular Once   enoxaparin (LOVENOX) injection  30 mg Subcutaneous Daily   insulin aspart  0-5 Units Subcutaneous QHS   insulin aspart  0-9 Units Subcutaneous TID WC   [START ON 02/05/2022] vitamin B-12  500 mcg Oral Daily   Continuous Infusions:  ceFEPime (MAXIPIME) IV     lactated ringers 150 mL/hr at 02/03/22 2339   metronidazole     norepinephrine (LEVOPHED) Adult infusion Stopped (02/04/22 0602)    Procedures/Studies: CT ABDOMEN PELVIS WO CONTRAST  Result Date: 02/03/2022 CLINICAL DATA:  Abdominal pain, acute, nonlocalized. Emesis and diarrhea beginning last night. Generalized weakness. Gallbladder drain in place. EXAM: CT ABDOMEN AND PELVIS WITHOUT CONTRAST TECHNIQUE: Multidetector CT imaging of the abdomen and pelvis was performed following the standard protocol without IV contrast. RADIATION DOSE REDUCTION: This exam was performed according to the departmental dose-optimization program which includes automated exposure control, adjustment of the mA and/or kV according to patient size and/or use of iterative reconstruction technique. COMPARISON:  10/30/2021 FINDINGS: Lower chest: Mild atelectasis and or scarring at the lung bases as seen previously. Cardiomegaly. Pacemaker in place. Hepatobiliary: Liver cysts seen in the right lobe, unchanged. Percutaneous drain in the gallbladder fossa without evidence of fluid collection or ongoing inflammatory change. Is this drain being actively managed? Pancreas: Normal Spleen: Normal Adrenals/Urinary Tract:  Adrenal glands are normal. Kidneys are normal. Bladder is normal. Stomach/Bowel: Stomach and small intestine are normal. Normal appendix. Ordinary diverticulosis of the left colon without evidence of diverticulitis. Vascular/Lymphatic: Aortic atherosclerosis. No aneurysm. IVC is normal. No adenopathy. Reproductive: No pelvic mass. Other: No free fluid or air. Musculoskeletal: Ordinary spinal curvature and degenerative change. IMPRESSION: Drain remains in place in the gallbladder fossa region. No evidence of abscess or residual inflammation in that region. Is this catheter being actively managed? Aortic Atherosclerosis (ICD10-I70.0). No other significant or acute finding. Electronically Signed   By: Nelson Chimes M.D.   On: 02/03/2022 22:25   DG Chest 2 View  Result Date: 02/03/2022 CLINICAL DATA:  Suspected sepsis.  Vomiting, diarrhea, weakness EXAM: CHEST - 2 VIEW COMPARISON:  08/22/2021 FINDINGS: Cardiac pacemaker. Heart size and pulmonary vascularity are normal. Lungs are clear. No pleural effusions. No pneumothorax. Mediastinal contours appear intact. Calcification of the aorta. Degenerative changes in the spine and shoulders. Pigtail drainage catheter in the right  upper quadrant. IMPRESSION: No active cardiopulmonary disease. Electronically Signed   By: Lucienne Capers M.D.   On: 02/03/2022 19:59    Orson Eva, DO  [facility]  If 7PM-7AM, please contact night-coverage [email or web address] Password [email or web address] 02/04/2022, 8:28 AM   LOS: 0 days

## 2022-02-04 NOTE — Assessment & Plan Note (Signed)
Follow-up with Dr. [name] in the outpatient setting

## 2022-02-04 NOTE — Assessment & Plan Note (Addendum)
Platelets 145 at the time of admission Secondary to sepsis Anticipate improvement with treatment of infectious process

## 2022-02-04 NOTE — Assessment & Plan Note (Signed)
Give 1 dose of IM B12 Start oral supplementation

## 2022-02-04 NOTE — Assessment & Plan Note (Addendum)
Holding nifedipine, hydralazine, losartan secondary to hypotension Reintroduce antihypertensive medications as BP improves 02/05/22--restarted metoprolol succinate

## 2022-02-04 NOTE — Progress Notes (Signed)
°  Transition of Care [facility]) Screening Note   Patient Details  Name: ARTHELLA HEADINGS Date of Birth: November 05, 1941   Transition of Care [facility]) CM/SW Contact:    Iona Beard, [place] Phone Number: 02/04/2022, 12:05 PM    Transition of Care Department [facility]) has reviewed patient and no TOC needs have been identified at this time. We will continue to monitor patient advancement through interdisciplinary progression rounds. If new patient transition needs arise, please place a TOC consult.

## 2022-02-04 NOTE — Assessment & Plan Note (Addendum)
Initially on metronidazole and cefepime -subsequently transitioned to [facility] pending final cultures>>de-escalated to ceftriaxone Cholecystotomy tube previously placed 11/04/2021--CT shows proper positiioning Tube is draining appropriately General surgery consult appreciate>>continue nonoperative management 02/03/2022 CT abdomen as discussed above Outpatient referral to IR made to readdress cholecystostomy tube

## 2022-02-04 NOTE — Assessment & Plan Note (Addendum)
Initially Holding apixaban temporarily in anticipation for any procedures 02/05/22--restarted apixaban

## 2022-02-04 NOTE — H&P (Signed)
History and Physical    Patient: Natalie Castro EQA:[id number] DOB: Jun 02, 1941 DOA: 02/03/2022 DOS: the patient was seen and examined on 02/04/2022 PCP: [facility], [facility]  Patient coming from: Home  Chief Complaint:  Chief Complaint  Patient presents with   Emesis    HPI: Natalie Castro is a 81 y.o. female with medical history significant of hypertension, hyperlipidemia, Complete heart Block Status Post pacemaker placement, history of PE on Eliquis, chronic cholecystitis who presents to the emergency department due to generalized weakness.  Patient was unable to provide a history, history was obtained from ED physician and daughter at bedside.  Per report, daughter states that patient started to have diarrhea 2 nights ago with associated nonbloody vomiting and subsequent weakness.  She has a cholecystotomy drain placed about 6 to 7 months ago.  ED course: In the emergency department, BP was soft at 87/53, but other vital signs are within normal range.  Work-up in the ED showed leukocytosis with bands, MCV 102.8, lactic acid 3.7 > 4.1 > 2.3.  Thrombocytopenia, BUN/creatinine 22/2.11 (baseline creatinine at 0.7-0.9), hypoalbuminemia, transaminitis.  Influenza A, B, SARS coronavirus was negative. CT abdomen and pelvis without contrast showed no evidence of abscess or residual inflammation in that region. Chest x-ray showed no active cardiopulmonary disease IV hydration per sepsis protocol was provided, patient was empirically started Flagyl and Azactam.  BP was still soft despite the IV hydration provided and patient was empirically started on IV pressors (Levophed).   Past Medical History:  Diagnosis Date   Acid reflux    Arthritis    Colon adenomas    AGE 65   Complete heart block (HCC)    STJ PPM Dr. 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 All other systems reviewed and are negative. Past Medical History:  Diagnosis Date   Acid reflux    Arthritis    Colon adenomas    AGE 40   Complete heart block (HCC)    STJ PPM Dr. 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, long term anticoag with apixaban    Hyperlipidemia    Myoview 03/2021    Myoview 4/22: EF 50, no infarct or ischemia; low risk   Presence of permanent cardiac pacemaker    Type 2 diabetes mellitus ([place])    Past Surgical History:  Procedure Laterality Date   COLONOSCOPY     3 SIMPLE ADENOMAS, AGE 39   COLONOSCOPY N/A 05/24/2018   Procedure: COLONOSCOPY;  Surgeon: Danie Binder, MD;  [facility];  Service: Endoscopy;  Laterality: N/A;  1:00pm   EP IMPLANTABLE DEVICE N/A 11/24/2015   Procedure: Pacemaker Implant;  Surgeon: [name], MD;  Location: [facility];  Service: Cardiovascular;  Laterality: N/A;   IR EXCHANGE BILIARY DRAIN  07/27/2021   IR EXCHANGE BILIARY DRAIN  08/23/2021   IR PERC CHOLECYSTOSTOMY  07/08/2021   IR PERC CHOLECYSTOSTOMY  11/04/2021   IR RADIOLOGIST EVAL & MGMT  09/22/2021   JOINT REPLACEMENT     knees bilat.  LEFT HEART CATH AND CORONARY ANGIOGRAPHY N/A 07/02/2021   Procedure: LEFT HEART CATH AND CORONARY ANGIOGRAPHY;  Surgeon: Jettie Booze, MD;  Location: [facility];  Service: Cardiovascular;  Laterality: N/A;   POLYPECTOMY  05/24/2018   Procedure: POLYPECTOMY;  Surgeon: Danie Binder, MD;  [facility];  Service: Endoscopy;;  ascending and hepatic flexure, transverse   TEE WITHOUT CARDIOVERSION N/A 08/27/2021   Procedure: TRANSESOPHAGEAL ECHOCARDIOGRAM (TEE);  Surgeon: Pixie Casino, MD;  Location: [facility] ENDOSCOPY;  Service: Cardiovascular;   Laterality: N/A;   TUBAL LIGATION     Social History:  reports that she has never smoked. She has never used smokeless tobacco. She reports that she does not drink alcohol and does not use drugs.  Allergies  Allergen Reactions   Penicillins Hives, Itching and Other (See Comments)    Has tolerated ceftriaxone 7/22 Has patient had a PCN reaction causing immediate rash, facial/tongue/throat swelling, SOB or lightheadedness with hypotension: No Has patient had a PCN reaction causing severe rash involving mucus membranes or skin necrosis: Yes Has patient had a PCN reaction that required hospitalization No Has patient had a PCN reaction occurring within the last 10 years: No If all of the above answers are "NO", then may proceed with Cephalosporin use.     Family History  Problem Relation Age of Onset   Heart failure Mother    Hypertension Mother    Heart failure Sister    Hypertension Father    Cancer Sister    Dementia Sister    Colon cancer Neg Hx    Colon polyps Neg Hx     Prior to Admission medications   Medication Sig Start Date End Date Taking? Authorizing Provider  acetaminophen (TYLENOL) 500 MG tablet Take 1,000 mg by mouth every 6 (six) hours as needed.    [provider]  atorvastatin (LIPITOR) 40 MG tablet TAKE (1) TABLET BY MOUTH AT BEDTIME. Patient taking differently: Take 40 mg by mouth at bedtime. 05/28/20   Corum, Rex Kras, MD  ELIQUIS 5 MG TABS tablet TAKE 1 TABLET BY MOUTH TWICE A DAY. 05/05/20   Corum, Rex Kras, MD  furosemide (LASIX) 40 MG tablet Take 1 tablet (40 mg total) by mouth daily. 08/19/21   Evans Lance, MD  glucose blood test strip Use to check blood glucose fasting , before lunch and dinner and after your largest meal 11/04/19   Corum, Rex Kras, MD  hydrALAZINE (APRESOLINE) 50 MG tablet Take 1 tablet (50 mg total) by mouth 2 (two) times daily. Patient taking differently: Take 50 mg by mouth 3 (three) times daily. 07/11/21 12/21/21  Elodia Florence., MD  Lancets [facility] ULTRASOFT) lancets Use to check glucose 4 x daily 11/04/19   Corum, Rex Kras, MD  LANTUS SOLOSTAR 100 UNIT/ML Solostar Pen ADMINISTER 30 UNITS UNDER THE SKIN AT BEDTIME Patient taking differently: Inject 30 Units into the skin at bedtime. 08/06/21   Cassandria Anger, MD  losartan (COZAAR) 100 MG tablet TAKE (1) TABLET BY MOUTH ONCE DAILY. Patient taking differently: Take 100 mg by mouth daily. 05/28/20   Corum, Rex Kras, MD  metFORMIN (GLUCOPHAGE) 1000 MG tablet TAKE (1) TABLET BY MOUTH TWICE DAILY. 05/17/21   Cassandria Anger, MD  metoprolol succinate (TOPROL-XL) 50 MG 24 hr tablet Take 1 tablet (50 mg total) by mouth 2 (two) times daily. Take with or immediately following a meal. 09/07/21   Evans Lance, MD  NIFEdipine (PROCARDIA-XL/NIFEDICAL-XL) 30  MG 24 hr tablet Take 30 mg by mouth daily. 10/26/21   [provider]  nitroGLYCERIN (NITROSTAT) 0.4 MG SL tablet Place 1 tablet (0.4 mg total) under the tongue every 5 (five) minutes as needed for chest pain. 02/24/21   Richardson Dopp T, PA-C  polyethylene glycol (MIRALAX / GLYCOLAX) 17 g packet Take 17 g by mouth daily as needed for mild constipation. 08/28/21   Cristal Deer, MD  potassium chloride (KLOR-CON) 10 MEQ tablet Take 1 tablet (10 mEq total) by mouth daily. 08/19/21   Evans Lance, MD    Physical Exam: Vitals:   02/04/22 0210 02/04/22 0220 02/04/22 0230 02/04/22 0239  BP: 139/60 (!) 137/54 137/63   Pulse: 80 78 71   Resp: 15 16 16    Temp:    98.4 F (36.9 C)  TempSrc:    Oral  SpO2: 100% 100% 100%   Weight:      Height:        Physical Exam  Gen:-Somnolent, though easily arousable elderly female who was ill appearing, but in no acute distress HEENT:- [place].AT, No sclera icterus Neck-Supple Neck,No JVD,.  Lungs-  CTAB, no wheezes or rhonchi CV- S1, S2 normal no rubs or gallops.  Pacemaker placement site noted on chest Abd-  +ve B.Sounds, Abd Soft, cystostomy tube with some clear liquid  noted in the drain.  Mild tenderness in RUQ Musculoskeletal:-Weak at this time and unable to partake in physical exam, however she was moving all extremities Extremity/Skin:- No  edema, skin warm and dry Psych-this cannot be assessed at this time due to patient's current condition Neuro-no new focal deficits, no tremors   Data Reviewed: Normal sinus rhythm at a rate of 87 bpm with QTc 562 ms, increased PR interval and RBBB  Assessment and Plan: * Sepsis due to undetermined organism ([place])- (present on admission) WBC was elevated  Elevated MCV MCV 102.8  Transaminitis- (present on admission) Elevated liver enzymes   Thrombocytopenia ([place])- (present on admission) Platelets 145  Acute on chronic cholecystitis Empiric antibiotics was started  AKI (acute kidney injury) ([place]) Baseline Cr at 0.7-0.9  Diabetes mellitus ([place]) CBG 121   Sepsis possibly secondary to acute on chronic cholecystitis Patient presents with leukocytosis, intermittent tachypnea and suspicion for gallbladder infection IV hydration per sepsis protocol was provided, patient did not respond appropriately to IV hydration and was started on IV Levophed. She was empirically started on Azactam and Flagyl, we will continue with IV cefepime.  Procalcitonin pending Continue IV hydration  Transaminitis possibly secondary to above/shock liver or multifactorial AST 372, ALT 301, ALP 157 Hepatitis panel will be checked RUQ ultrasound will be done in the morning  Lactic acidosis Lactic acid 3.7 > 4.1 > 2.3 Continue IV hydration and continue to trend lactic acid  Acute diarrhea and vomiting Patient was reported to have had some diarrhea and vomiting, though she has not had any of the symptoms since arrival to the ED C. Diff will be checked GI stool panel will be checked  Thrombocytopenia possibly reactive Platelets 145; continue to monitor platelet levels with morning labs  Elevated MCV (102.8) Folate and  vitamin B12 levels will be checked  Type II DM Continue ISS and hypoglycemia protocol  Acute kidney injury BUN/creatinine 22/2.11 (baseline creatinine at 0.7-0.9) Renally adjust medications, avoid nephrotoxic agents/dehydration/hypotension   Advance Care Planning:   Code Status: Prior   Consults: None  Family Communication: Daughter at bedside (all questions answered to satisfaction)  Severity of Illness: The appropriate  patient status for this patient is INPATIENT. Inpatient status is judged to be reasonable and necessary in order to provide the required intensity of service to ensure the patient's safety. The patient's presenting symptoms, physical exam findings, and initial radiographic and laboratory data in the context of their chronic comorbidities is felt to place them at high risk for further clinical deterioration. Furthermore, it is not anticipated that the patient will be medically stable for discharge from the hospital within 2 midnights of admission.   * I certify that at the point of admission it is my clinical judgment that the patient will require inpatient hospital care spanning beyond 2 midnights from the point of admission due to high intensity of service, high risk for further deterioration and high frequency of surveillance required.*  Author: Bernadette Hoit, DO 02/04/2022 2:49 AM  For on call review [email or web address].

## 2022-02-04 NOTE — Assessment & Plan Note (Signed)
BMI 32.61 Lifestyle modification

## 2022-02-04 NOTE — Assessment & Plan Note (Addendum)
10/30/2021 hemoglobin A1c 6.8 NovoLog sliding scale Will restart reduced dose lantus on discharged that can be titrated as outpatient

## 2022-02-04 NOTE — Assessment & Plan Note (Addendum)
Concerned about UTI given the patient's dysuria and pyuria UA 21-50 WBC Follow urine culture--neg but collected after abx started

## 2022-02-05 DIAGNOSIS — A419 Sepsis, unspecified organism: Secondary | ICD-10-CM | POA: Diagnosis not present

## 2022-02-05 DIAGNOSIS — R6521 Severe sepsis with septic shock: Secondary | ICD-10-CM

## 2022-02-05 DIAGNOSIS — R6520 Severe sepsis without septic shock: Secondary | ICD-10-CM | POA: Diagnosis not present

## 2022-02-05 DIAGNOSIS — K811 Chronic cholecystitis: Secondary | ICD-10-CM

## 2022-02-05 DIAGNOSIS — Z221 Carrier of other intestinal infectious diseases: Secondary | ICD-10-CM

## 2022-02-05 LAB — GASTROINTESTINAL PANEL BY PCR, STOOL (REPLACES STOOL CULTURE)

## 2022-02-05 LAB — COMPREHENSIVE METABOLIC PANEL
ALT: 116 U/L — ABNORMAL HIGH (ref 0–44)
AST: 66 U/L — ABNORMAL HIGH (ref 15–41)
Albumin: 2.1 g/dL — ABNORMAL LOW (ref 3.5–5.0)
Alkaline Phosphatase: 99 U/L (ref 38–126)
Anion gap: 9 (ref 5–15)
BUN: 16 mg/dL (ref 8–23)
CO2: 25 mmol/L (ref 22–32)
Calcium: 7.8 mg/dL — ABNORMAL LOW (ref 8.9–10.3)
Chloride: 108 mmol/L (ref 98–111)
Creatinine, Ser: 0.88 mg/dL (ref 0.44–1.00)
GFR, Estimated: >60 mL/min (ref >60)
Glucose, Bld: 141 mg/dL — ABNORMAL HIGH (ref 70–99)
Potassium: 3.5 mmol/L (ref 3.5–5.1)
Sodium: 142 mmol/L (ref 135–145)
Total Bilirubin: 0.9 mg/dL (ref 0.3–1.2)
Total Protein: 5.5 g/dL — ABNORMAL LOW (ref 6.5–8.1)

## 2022-02-05 LAB — CBC
HCT: 33.3 % — ABNORMAL LOW (ref 36.0–46.0)
Hemoglobin: 10.5 g/dL — ABNORMAL LOW (ref 12.0–15.0)
MCH: 32.2 pg (ref 26.0–34.0)
MCHC: 31.5 g/dL (ref 30.0–36.0)
MCV: 102.1 fL — ABNORMAL HIGH (ref 80.0–100.0)
Platelets: 85 10*3/uL — ABNORMAL LOW (ref 150–400)
RBC: 3.26 MIL/uL — ABNORMAL LOW (ref 3.87–5.11)
RDW: 14.8 % (ref 11.5–15.5)
WBC: 8.9 10*3/uL (ref 4.0–10.5)
nRBC: 0 % (ref 0.0–0.2)

## 2022-02-05 LAB — GLUCOSE, CAPILLARY
Glucose-Capillary: 185 mg/dL — ABNORMAL HIGH (ref 70–99)
Glucose-Capillary: 169 mg/dL — ABNORMAL HIGH (ref 70–99)
Glucose-Capillary: 138 mg/dL — ABNORMAL HIGH (ref 70–99)
Glucose-Capillary: 196 mg/dL — ABNORMAL HIGH (ref 70–99)

## 2022-02-05 LAB — HEMOGLOBIN A1C
Hgb A1c MFr Bld: 6.6 % — ABNORMAL HIGH (ref 4.8–5.6)
Mean Plasma Glucose: 142.72 mg/dL

## 2022-02-05 LAB — URINE CULTURE: Culture: NO GROWTH

## 2022-02-05 LAB — MAGNESIUM: Magnesium: 1.5 mg/dL — ABNORMAL LOW (ref 1.7–2.4)

## 2022-02-05 LAB — CLOSTRIDIUM DIFFICILE BY PCR, REFLEXED: Toxigenic C. Difficile by PCR: POSITIVE — AB

## 2022-02-05 MED ORDER — MAGNESIUM SULFATE 2 GM/50ML IV SOLN
2.0000 g | Freq: Once | INTRAVENOUS | Status: AC
  Administered 2022-02-05: 2 g via INTRAVENOUS
  Filled 2022-02-05: qty 50

## 2022-02-05 MED ORDER — ENOXAPARIN SODIUM 40 MG/0.4ML IJ SOSY: 40.0000 mg | PREFILLED_SYRINGE | Freq: Every day | INTRAMUSCULAR | Status: DC

## 2022-02-05 MED ORDER — SODIUM CHLORIDE 0.9 % IV SOLN
1.0000 g | Freq: Three times a day (TID) | INTRAVENOUS | Status: DC
  Administered 2022-02-05 – 2022-02-06 (×4): 1 g via INTRAVENOUS
  Filled 2022-02-05 (×4): qty 20

## 2022-02-05 MED ORDER — APIXABAN 5 MG PO TABS
5.0000 mg | ORAL_TABLET | Freq: Two times a day (BID) | ORAL | Status: DC
  Administered 2022-02-05 – 2022-02-09 (×9): 5 mg via ORAL
  Filled 2022-02-05 (×9): qty 1

## 2022-02-05 NOTE — Assessment & Plan Note (Addendum)
Mag = 1.5 replaced

## 2022-02-05 NOTE — Assessment & Plan Note (Addendum)
Assay represents colonization Pt is clinically improved with improve abd pain and normalized WBC Antigen positive with neg toxin>>colonization

## 2022-02-05 NOTE — Progress Notes (Addendum)
Subjective: Patient denies any abdominal pain.  She is hungry.  No nausea or vomiting is noted.  Objective: Vital signs in last 24 hours: Temp:  [98.8 F (37.1 C)-101.8 F (38.8 C)] 99.7 F (37.6 C) (02/11 1010) Pulse Rate:  [60-107] 75 (02/11 1010) Resp:  [17-31] 20 (02/11 1010) BP: (98-175)/(46-99) 156/66 (02/11 1010) SpO2:  [91 %-100 %] 99 % (02/11 1010) Weight:  [92.3 kg] 92.3 kg (02/11 0500) Last BM Date: 02/04/22  Intake/Output from previous day: 02/10 0701 - 02/11 0700 In: 2566.9 [P.O.:640; I.V.:1545.3; IV Piggyback:381.6] Out: 1000 [Urine:900; Drains:100] Intake/Output this shift: No intake/output data recorded.  General appearance: alert, cooperative, and no distress GI: soft, non-tender; bowel sounds normal; no masses,  no organomegaly and cholecystostomy tube with some bilious drainage.  Lab Results:  Recent Labs    02/04/22 0334 02/05/22 0543  WBC 20.3* 8.9  HGB 10.8* 10.5*  HCT 35.1* 33.3*  PLT 114* 85*   BMET Recent Labs    02/04/22 0334 02/05/22 0543  NA 141 142  K 4.3 3.5  CL 110 108  CO2 24 25  GLUCOSE 159* 141*  BUN 26* 16  CREATININE 1.69* 0.88  CALCIUM 7.8* 7.8*   PT/INR Recent Labs    02/03/22 2022  LABPROT 19.3*  INR 1.6*    Studies/Results: CT ABDOMEN PELVIS WO CONTRAST  Result Date: 02/03/2022 CLINICAL DATA:  Abdominal pain, acute, nonlocalized. Emesis and diarrhea beginning last night. Generalized weakness. Gallbladder drain in place. EXAM: CT ABDOMEN AND PELVIS WITHOUT CONTRAST TECHNIQUE: Multidetector CT imaging of the abdomen and pelvis was performed following the standard protocol without IV contrast. RADIATION DOSE REDUCTION: This exam was performed according to the departmental dose-optimization program which includes automated exposure control, adjustment of the mA and/or kV according to patient size and/or use of iterative reconstruction technique. COMPARISON:  10/30/2021 FINDINGS: Lower chest: Mild atelectasis and or  scarring at the lung bases as seen previously. Cardiomegaly. Pacemaker in place. Hepatobiliary: Liver cysts seen in the right lobe, unchanged. Percutaneous drain in the gallbladder fossa without evidence of fluid collection or ongoing inflammatory change. Is this drain being actively managed? Pancreas: Normal Spleen: Normal Adrenals/Urinary Tract: Adrenal glands are normal. Kidneys are normal. Bladder is normal. Stomach/Bowel: Stomach and small intestine are normal. Normal appendix. Ordinary diverticulosis of the left colon without evidence of diverticulitis. Vascular/Lymphatic: Aortic atherosclerosis. No aneurysm. IVC is normal. No adenopathy. Reproductive: No pelvic mass. Other: No free fluid or air. Musculoskeletal: Ordinary spinal curvature and degenerative change. IMPRESSION: Drain remains in place in the gallbladder fossa region. No evidence of abscess or residual inflammation in that region. Is this catheter being actively managed? Aortic Atherosclerosis (ICD10-I70.0). No other significant or acute finding. Electronically Signed   By: Nelson Chimes M.D.   On: 02/03/2022 22:25   DG Chest 2 View  Result Date: 02/03/2022 CLINICAL DATA:  Suspected sepsis.  Vomiting, diarrhea, weakness EXAM: CHEST - 2 VIEW COMPARISON:  08/22/2021 FINDINGS: Cardiac pacemaker. Heart size and pulmonary vascularity are normal. Lungs are clear. No pleural effusions. No pneumothorax. Mediastinal contours appear intact. Calcification of the aorta. Degenerative changes in the spine and shoulders. Pigtail drainage catheter in the right upper quadrant. IMPRESSION: No active cardiopulmonary disease. Electronically Signed   By: Lucienne Capers M.D.   On: 02/03/2022 19:59   US Abdomen Limited  Result Date: 02/04/2022 CLINICAL DATA:  Sepsis.  Percutaneous cholecystostomy 11/04/2021. EXAM: ULTRASOUND ABDOMEN LIMITED RIGHT UPPER QUADRANT COMPARISON:  CT of 1 day prior FINDINGS: Gallbladder: Presumably decompressed by cholecystostomy  catheter. Common bile duct: Diameter: Upper normal for age, 8 mm. Liver: Poorly visualized. No gross abnormality identified. Portal vein is patent on color Doppler imaging with normal direction of blood flow towards the liver. Other: Generally limited exam secondary to overlying bowel gas and bandaging. IMPRESSION: Limitations as detailed above. Gallbladder not well visualized, presumably decompressed by cholecystostomy catheter as on yesterday's CT. Electronically Signed   By: Abigail Miyamoto M.D.   On: 02/04/2022 13:07    Anti-infectives: Anti-infectives (From admission, onward)    Start     Dose/Rate Route Frequency Ordered Stop   02/04/22 2200  cefTRIAXone (ROCEPHIN) 2 g in sodium chloride 0.9 % 100 mL IVPB        2 g 200 mL/hr over 30 Minutes Intravenous Every 24 hours 02/04/22 1609     02/04/22 0800  ceFEPIme (MAXIPIME) 2 g in sodium chloride 0.9 % 100 mL IVPB  Status:  Discontinued        2 g 200 mL/hr over 30 Minutes Intravenous Every 24 hours 02/03/22 2214 02/04/22 1609   02/04/22 0800  metroNIDAZOLE (FLAGYL) IVPB 500 mg        500 mg 100 mL/hr over 60 Minutes Intravenous Every 12 hours 02/04/22 0700     02/03/22 2130  ertapenem (INVANZ) 1,000 mg in sodium chloride 0.9 % 100 mL IVPB  Status:  Discontinued        1 g 200 mL/hr over 30 Minutes Intravenous  Once 02/03/22 2120 02/03/22 2123   02/03/22 2130  aztreonam (AZACTAM) 2 g in sodium chloride 0.9 % 100 mL IVPB        2 g 200 mL/hr over 30 Minutes Intravenous  Once 02/03/22 2123 02/03/22 2216   02/03/22 2130  metroNIDAZOLE (FLAGYL) IVPB 500 mg        500 mg 100 mL/hr over 60 Minutes Intravenous  Once 02/03/22 2123 02/03/22 2318       Assessment/Plan: Impression: E. coli sepsis/bacteremia.  Urine culture negative, though I do not know if this was done after antibiotics were initiated.  There is no purulent drainage in the cholecystostomy tube.  Her transaminitis is resolving.  Patient continues to have no leukocytosis.  Stool  panel pending. Plan: Will discuss with Dr. Carles Collet.  May advance diet as tolerated.    LOS: 1 day    Aviva Signs 02/05/2022

## 2022-02-05 NOTE — Progress Notes (Addendum)
PROGRESS NOTE  Natalie Castro YCX:[id number] DOB: Aug 18, 1941 DOA: 02/03/2022 PCP: [facility]  Brief History:  81 year old female with a history of HFpEF, coronary disease, diabetes mellitus type 2, complete heart block status post permanent pacemaker, hypertension, hyperlipidemia, pulmonary embolus on apixaban, and chronic cholecystitis presenting with 2-day history of abdominal pain and generalized weakness.  The patient is difficult historian at best.  History is supplemented by the patient's daughter and review of the medical record.  Apparently, the patient had a couple episodes of nausea and vomiting on 02/03/2022 with some loose stool.  There is no reports of hematochezia or melena.  The patient had subjective fevers and chills.  The patient denies any headache, chest pain, coughing, shortness of breath, hemoptysis.  She states that she has had some dysuria.  As result, the patient was brought to the emergency department for further evaluation.  The patient has had generalized weakness for about what she had difficulty getting out of bed.  At baseline, the patient is able to ambulate with a walker.  Her daughter states that she usually flushes the cholecystotomy tube 2 times per week.  There has not been any difficulty flushing it. Notably, the patient was admitted to the hospital on 10/30/2021 to 11/05/2021 for recurrent acute acalculous cholecystitis.  A percutaneous cholecystotomy tube was again placed on 11/04/2021.  Notably, the patient also had a previous cholecystotomy tube placed on 07/08/2021.  It was removed on 09/22/2021 after 1 month capping trial. In the emergency department, the patient had was hypotensive with a blood pressure 87/53.  BMP showed sodium 141, potassium 4.1, bicarbonate 23, serum creatinine 2.11.  WBC 22.3, hemoglobin 12.8, platelets 1-45,000.  CT of the abdomen and pelvis showed bibasilar scarring and atelectasis.  There was an unchanged  percutaneous drain in the gallbladder fossa without evidence of fluid collection or ongoing inflammation.  The patient was started on aztreonam and metronidazole in the ED.  Lactic acid peaked 4.1.  AST 372, ALT 301, alk phosphatase 157, total bilirubin 3.0.  She was admitted for further evaluation and treatment of sepsis.  Upon arrival to the medical floor, the patient was started on cefepime and metronidazole.  General surgery was consulted to assist with management.     Assessment and Plan: * Severe sepsis ([place])- (present on admission) Present on admission Patient presented with leukocytosis, tachycardia, and hypotension and acute kidney injury Lactic acid 4.1>> 2.3 Secondary to hepatobiliary source and urinary source Initially on cefepime and metronidazole 2/11>>now on merrem pending final culture data  Acute on chronic cholecystitis- (present on admission) Initially on metronidazole and cefepime -now switched to merrem pending final cultures Cholecystotomy tube previously placed 11/04/2021 General surgery consult appreciate>>continue nonoperative management 02/03/2022 CT abdomen as discussed above  AKI (acute kidney injury) ([place])- (present on admission) Baseline Cr at 0.7-0.9 Presented with serum creatinine 2.11 Secondary to sepsis and hemodynamic changes Hold losartan Continue IV fluids  Pyuria- (present on admission) Concerned about UTI given the patient's dysuria and pyuria UA 21-50 WBC Follow urine culture--neg  Thrombocytopenia ([place])- (present on admission) Platelets 145 at the time of admission Secondary to sepsis Anticipate improvement with treatment of infectious process  Chronic diastolic CHF (congestive heart failure) ([place])- (present on admission) Appears clinically euvolemic Holding furosemide temporarily due to hypotension and sepsis  Hypomagnesemia Mag = 1.5 replete  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 Pt is clinically improved  with no abd pain and improve abd  pain Antigen positive with neg toxin>>colonization  B12 deficiency Give 1 dose of IM B12 Start oral supplementation  Class 1 obesity- (present on admission) BMI 32.61 Lifestyle modification  Transaminitis- (present on admission) Elevated liver enzymes   Mixed hyperlipidemia- (present on admission) Holding statin secondary to elevated LFTs  History of pulmonary embolus (PE)- (present on admission) Initially Holding apixaban temporarily in anticipation for any procedures 02/05/22--restart apixaban  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 [name] in the outpatient setting  Essential hypertension, benign- (present on admission) Holding nifedipine, hydralazine, losartan, metoprolol succinate secondary to hypotension Reintroduce antihypertensive medications as BP improves 02/05/22--restart metoprolol succinate  Controlled type 2 diabetes mellitus without complication, without long-term current use of insulin (HCC) 10/30/2021 hemoglobin A1c 6.8 NovoLog sliding scale Holding metformin       Status is: Inpatient Remains inpatient appropriate because: Severity of illness with hemodynamic instability and requirement for intravenous antibiotics         Family Communication:   sister and daughter updated 2/11  Consultants:  general surgery  Code Status:  FULL  DVT Prophylaxis:  apixaban   Procedures: As Listed in Progress Note Above  Antibiotics: Cefepime 2/10>>2/11 Metronidazole 2/10>>2/11 Merrem 2/11>>       Subjective: Pt states she is feeling a little better.  Abd pain is improved.  Denies f/c, cp, sob, n/v/d.  Objective: Vitals:   02/05/22 0759 02/05/22 0800 02/05/22 0846 02/05/22 1010  BP: (!) 139/99 (!) 139/99  (!) 156/66  Pulse: 80 79 74 75  Resp: 17 (!) 24 (!) 21 20  Temp: 100 F (37.8 C)   99.7 F (37.6 C)  TempSrc: Oral   Oral  SpO2: 100% 100% 100% 99%   Weight:      Height:        Intake/Output Summary (Last 24 hours) at 02/05/2022 1300 Last data filed at 02/05/2022 1601 Gross per 24 hour  Intake 2501.12 ml  Output 1000 ml  Net 1501.12 ml   Weight change: 3.848 kg Exam:  General:  Pt is alert, follows commands appropriately, not in acute distress HEENT: No icterus, No thrush, No neck mass, [place]/AT Cardiovascular: RRR, S1/S2, no rubs, no gallops Respiratory: fine bibasilar crackles no wheezing, no crackles, no rhonchi Abdomen: Soft/+BS, non tender, non distended, no guarding Extremities: No edema, No lymphangitis, No petechiae, No rashes, no synovitis   Data Reviewed: I have personally reviewed following labs and imaging studies Basic Metabolic Panel: Recent Labs  Lab 02/03/22 2022 02/04/22 0334 02/05/22 0543  NA 141 141 142  K 4.1 4.3 3.5  CL 103 110 108  CO2 _0 GLUCOSE 121* 159* 141*  BUN 22 26* 16  CREATININE 2.11* 1.69* 0.88  CALCIUM 8.8* 7.8* 7.8*  MG  --  1.5* 1.5*  PHOS  --  3.9  --    Liver Function Tests: Recent Labs  Lab 02/03/22 2022 02/04/22 0334 02/05/22 0543  AST 372* 219* 66*  ALT 301* 223* 116*  ALKPHOS 157* 124 99  BILITOT 3.0* 1.9* 0.9  PROT 7.7 6.3* 5.5*  ALBUMIN 3.4* 2.7* 2.1*   No results for input(s): LIPASE, AMYLASE in the last 168 hours. No results for input(s): AMMONIA in the last 168 hours. Coagulation Profile: Recent Labs  Lab 02/03/22 2022  INR 1.6*   CBC: Recent Labs  Lab 02/03/22 2022 02/04/22 0334 02/05/22 0543  WBC 22.3* 20.3* 8.9  NEUTROABS 19.6*  --   --   HGB 12.8 10.8* 10.5*  HCT 40.8 35.1* 33.3*  MCV 102.8* 104.5* 102.1*  PLT 145* 114* 85*   Cardiac Enzymes: No results for input(s): CKTOTAL, CKMB, CKMBINDEX, TROPONINI in the last 168 hours. BNP: Invalid input(s): POCBNP CBG: Recent Labs  Lab 02/04/22 1128 02/04/22 1708 02/04/22 2158 02/05/22 0801 02/05/22 1118  GLUCAP 121* 145* 129* 185* 169*   HbA1C: Recent Labs    02/03/22 2022  02/05/22 0544  HGBA1C 6.5* 6.6*   Urine analysis:    Component Value Date/Time   COLORURINE AMBER (A) 02/04/2022 0310   APPEARANCEUR HAZY (A) 02/04/2022 0310   LABSPEC 1.020 02/04/2022 0310   PHURINE 5.0 02/04/2022 0310   GLUCOSEU 50 (A) 02/04/2022 0310   HGBUR SMALL (A) 02/04/2022 0310   BILIRUBINUR NEGATIVE 02/04/2022 0310   KETONESUR NEGATIVE 02/04/2022 0310   PROTEINUR 100 (A) 02/04/2022 0310   UROBILINOGEN 0.2 06/18/2015 2300   NITRITE NEGATIVE 02/04/2022 0310   LEUKOCYTESUR NEGATIVE 02/04/2022 0310   Sepsis Labs: _0 (procalcitonin:4,lacticidven:4) ) Recent Results (from the past 240 hour(s))  Culture, blood (Routine x 2)     Status: Abnormal (Preliminary result)   Collection Time: 02/03/22  8:22 PM   Specimen: BLOOD RIGHT HAND  Result Value Ref Range Status   Specimen Description   Final    BLOOD RIGHT HAND Performed at [facility], [address], [place], [place] [address]    Special Requests   Final    BOTTLES DRAWN AEROBIC AND ANAEROBIC Blood Culture results may not be optimal due to an inadequate volume of blood received in culture bottles Performed at [facility], [address] [place], [place] [address]    Culture  Setup Time   Final    GRAM NEGATIVE RODS IN BOTH AEROBIC AND ANAEROBIC BOTTLES Gram Stain Report Called to,Read Back By and Verified With: LOOMIS @ 0909 ON [id number] BY HENDERSON L CRITICAL RESULT CALLED TO, READ BACK BY AND VERIFIED WITH: C,MCGIVVONY RN _1  02/04/22 EB    Culture (A)  Final    ESCHERICHIA COLI SUSCEPTIBILITIES TO FOLLOW Performed at [facility], [address] [place], [place] [address]    Report Status PENDING  Incomplete  Culture, blood (Routine x 2)     Status: None (Preliminary result)   Collection Time: 02/03/22  8:22 PM   Specimen: Left Antecubital; Blood  Result Value Ref Range Status   Specimen Description   Final    LEFT ANTECUBITAL Performed at [facility], [address], [place], [place]  [address]    Special Requests   Final    BOTTLES DRAWN AEROBIC AND ANAEROBIC Blood Culture adequate volume Performed at [facility], [address] [place], [place] [address]    Culture  Setup Time   Final    GRAM NEGATIVE RODS IN BOTH AEROBIC AND ANAEROBIC BOTTLES Gram Stain Report Called to,Read Back By and Verified With: LOOMIS @ 0909 ON [id number] BY HENDERSON L Performed at [facility], [address], [place], [place] [address]    Culture   Final    Lonell Grandchild NEGATIVE RODS IDENTIFICATION TO FOLLOW Performed at [facility], [place] [address] [place], [place] [address]    Report Status PENDING  Incomplete  Blood Culture ID Panel (Reflexed)     Status: Abnormal   Collection Time: 02/03/22  8:22 PM  Result Value Ref Range Status   Enterococcus faecalis NOT DETECTED NOT DETECTED Final   Enterococcus Faecium NOT DETECTED NOT DETECTED Final   Listeria monocytogenes NOT DETECTED NOT DETECTED Final  Staphylococcus species NOT DETECTED NOT DETECTED Final   Staphylococcus aureus (BCID) NOT DETECTED NOT DETECTED Final   Staphylococcus epidermidis NOT DETECTED NOT DETECTED Final   Staphylococcus lugdunensis NOT DETECTED NOT DETECTED Final   Streptococcus species NOT DETECTED NOT DETECTED Final   Streptococcus agalactiae NOT DETECTED NOT DETECTED Final   Streptococcus pneumoniae NOT DETECTED NOT DETECTED Final   Streptococcus pyogenes NOT DETECTED NOT DETECTED Final   A.calcoaceticus-baumannii NOT DETECTED NOT DETECTED Final   Bacteroides fragilis NOT DETECTED NOT DETECTED Final   Enterobacterales DETECTED (A) NOT DETECTED Final    Comment: Enterobacterales represent a large order of gram negative bacteria, not a single organism. CRITICAL RESULT CALLED TO, READ BACK BY AND VERIFIED WITH: C,MCGIVVONY RN _0  02/04/22 EB    Enterobacter cloacae complex NOT DETECTED NOT DETECTED Final   Escherichia coli DETECTED (A) NOT DETECTED Final    Comment: CRITICAL RESULT CALLED TO, READ BACK BY AND  VERIFIED WITH: C,MCGIVVONY RN _1  02/04/22 EB    Klebsiella aerogenes NOT DETECTED NOT DETECTED Final   Klebsiella oxytoca NOT DETECTED NOT DETECTED Final   Klebsiella pneumoniae NOT DETECTED NOT DETECTED Final   Proteus species NOT DETECTED NOT DETECTED Final   Salmonella species NOT DETECTED NOT DETECTED Final   Serratia marcescens NOT DETECTED NOT DETECTED Final   Haemophilus influenzae NOT DETECTED NOT DETECTED Final   Neisseria meningitidis NOT DETECTED NOT DETECTED Final   Pseudomonas aeruginosa NOT DETECTED NOT DETECTED Final   Stenotrophomonas maltophilia NOT DETECTED NOT DETECTED Final   Candida albicans NOT DETECTED NOT DETECTED Final   Candida auris NOT DETECTED NOT DETECTED Final   Candida glabrata NOT DETECTED NOT DETECTED Final   Candida krusei NOT DETECTED NOT DETECTED Final   Candida parapsilosis NOT DETECTED NOT DETECTED Final   Candida tropicalis NOT DETECTED NOT DETECTED Final   Cryptococcus neoformans/gattii NOT DETECTED NOT DETECTED Final   CTX-M ESBL NOT DETECTED NOT DETECTED Final   Carbapenem resistance IMP NOT DETECTED NOT DETECTED Final   Carbapenem resistance KPC NOT DETECTED NOT DETECTED Final   Carbapenem resistance NDM NOT DETECTED NOT DETECTED Final   Carbapenem resist OXA 48 LIKE NOT DETECTED NOT DETECTED Final   Carbapenem resistance VIM NOT DETECTED NOT DETECTED Final    Comment: Performed at [facility], [address], [place], [place] [address]  Resp Panel by RT-PCR (Flu A&B, Covid) Nasopharyngeal Swab     Status: None   Collection Time: 02/03/22  9:31 PM   Specimen: Nasopharyngeal Swab; Nasopharyngeal(NP) swabs in vial transport medium  Result Value Ref Range Status   SARS Coronavirus 2 by RT PCR NEGATIVE NEGATIVE Final    Comment: (NOTE) SARS-CoV-2 target nucleic acids are NOT DETECTED.  The SARS-CoV-2 RNA is generally detectable in upper respiratory specimens during the acute phase of infection. The lowest concentration of  SARS-CoV-2 viral copies this assay can detect is 138 copies/mL. A negative result does not preclude SARS-Cov-2 infection and should not be used as the sole basis for treatment or other patient management decisions. A negative result may occur with  improper specimen collection/handling, submission of specimen other than nasopharyngeal swab, presence of viral mutation(s) within the areas targeted by this assay, and inadequate number of viral copies(<138 copies/mL). A negative result must be combined with clinical observations, patient history, and epidemiological information. The expected result is Negative.  Fact Sheet for Patients:  EntrepreneurPulse.com.au  Fact Sheet for Healthcare Providers:  IncredibleEmployment.be  This test is no t yet approved or cleared by  the [employer] and  has been authorized for detection and/or diagnosis of SARS-CoV-2 by FDA under an Emergency Use Authorization (EUA). This EUA will remain  in effect (meaning this test can be used) for the duration of the COVID-19 declaration under Section 564(b)(1) of the Act, 21 U.S.C.section 360bbb-3(b)(1), unless the authorization is terminated  or revoked sooner.       Influenza A by PCR NEGATIVE NEGATIVE Final   Influenza B by PCR NEGATIVE NEGATIVE Final    Comment: (NOTE) The Xpert Xpress SARS-CoV-2/FLU/RSV plus assay is intended as an aid in the diagnosis of influenza from Nasopharyngeal swab specimens and should not be used as a sole basis for treatment. Nasal washings and aspirates are unacceptable for Xpert Xpress SARS-CoV-2/FLU/RSV testing.  Fact Sheet for Patients: EntrepreneurPulse.com.au  Fact Sheet for Healthcare Providers: IncredibleEmployment.be  This test is not yet approved or cleared by the [place] FDA and has been authorized for detection and/or diagnosis of SARS-CoV-2 by FDA under an Emergency Use  Authorization (EUA). This EUA will remain in effect (meaning this test can be used) for the duration of the COVID-19 declaration under Section 564(b)(1) of the Act, 21 U.S.C. section 360bbb-3(b)(1), unless the authorization is terminated or revoked.  Performed at [facility], [address], [place], [place] [address]   MRSA Next Gen by PCR, Nasal     Status: None   Collection Time: 02/04/22  2:58 AM  Result Value Ref Range Status   MRSA by PCR Next Gen NOT DETECTED NOT DETECTED Final    Comment: (NOTE) The GeneXpert MRSA Assay (FDA approved for NASAL specimens only), is one component of a comprehensive MRSA colonization surveillance program. It is not intended to diagnose MRSA infection nor to guide or monitor treatment for MRSA infections. Test performance is not FDA approved in patients less than 31 years old. Performed at [facility], [address] [place], [place] [address]   Urine Culture     Status: None   Collection Time: 02/04/22  3:10 AM   Specimen: Urine, Clean Catch  Result Value Ref Range Status   Specimen Description   Final    URINE, CLEAN CATCH Performed at [facility], [address], [place], [place] [address]    Special Requests   Final    NONE Performed at [facility], [address] [place], [place] [address]    Culture   Final    NO GROWTH Performed at [facility], [place] [address], [place], [place] [address]    Report Status 02/05/2022 FINAL  Final  C Difficile Quick Screen w PCR reflex     Status: Abnormal   Collection Time: 02/04/22  4:41 PM   Specimen: STOOL  Result Value Ref Range Status   C Diff antigen POSITIVE (A) NEGATIVE Final   C Diff toxin NEGATIVE NEGATIVE Final   C Diff interpretation Results are indeterminate. See PCR results.  Final    Comment: Performed at [facility], [address] [place], [place] [address]  C. Diff by PCR, Reflexed     Status: Abnormal   Collection Time: 02/04/22  4:41 PM  Result Value Ref Range Status    Toxigenic C. Difficile by PCR POSITIVE (A) NEGATIVE Final    Comment: Positive for toxigenic C. difficile with little to no toxin production. Only treat if clinical presentation suggests symptomatic illness. Performed at [facility], [place] [address] [place], [place] [address]      Scheduled Meds:  apixaban  5 mg Oral BID   Chlorhexidine Gluconate Cloth  6 each Topical Q0600   insulin aspart  0-5 Units Subcutaneous QHS   insulin aspart  0-9 Units Subcutaneous TID WC   vitamin B-12  500 mcg Oral Daily   Continuous Infusions:  magnesium sulfate bolus IVPB     meropenem (MERREM) IV     metronidazole 500 mg (02/05/22 0829)    Procedures/Studies: CT ABDOMEN PELVIS WO CONTRAST  Result Date: 02/03/2022 CLINICAL DATA:  Abdominal pain, acute, nonlocalized. Emesis and diarrhea beginning last night. Generalized weakness. Gallbladder drain in place. EXAM: CT ABDOMEN AND PELVIS WITHOUT CONTRAST TECHNIQUE: Multidetector CT imaging of the abdomen and pelvis was performed following the standard protocol without IV contrast. RADIATION DOSE REDUCTION: This exam was performed according to the departmental dose-optimization program which includes automated exposure control, adjustment of the mA and/or kV according to patient size and/or use of iterative reconstruction technique. COMPARISON:  10/30/2021 FINDINGS: Lower chest: Mild atelectasis and or scarring at the lung bases as seen previously. Cardiomegaly. Pacemaker in place. Hepatobiliary: Liver cysts seen in the right lobe, unchanged. Percutaneous drain in the gallbladder fossa without evidence of fluid collection or ongoing inflammatory change. Is this drain being actively managed? Pancreas: Normal Spleen: Normal Adrenals/Urinary Tract: Adrenal glands are normal. Kidneys are normal. Bladder is normal. Stomach/Bowel: Stomach and small intestine are normal. Normal appendix. Ordinary diverticulosis of the left colon without evidence of diverticulitis.  Vascular/Lymphatic: Aortic atherosclerosis. No aneurysm. IVC is normal. No adenopathy. Reproductive: No pelvic mass. Other: No free fluid or air. Musculoskeletal: Ordinary spinal curvature and degenerative change. IMPRESSION: Drain remains in place in the gallbladder fossa region. No evidence of abscess or residual inflammation in that region. Is this catheter being actively managed? Aortic Atherosclerosis (ICD10-I70.0). No other significant or acute finding. Electronically Signed   By: Nelson Chimes M.D.   On: 02/03/2022 22:25   DG Chest 2 View  Result Date: 02/03/2022 CLINICAL DATA:  Suspected sepsis.  Vomiting, diarrhea, weakness EXAM: CHEST - 2 VIEW COMPARISON:  08/22/2021 FINDINGS: Cardiac pacemaker. Heart size and pulmonary vascularity are normal. Lungs are clear. No pleural effusions. No pneumothorax. Mediastinal contours appear intact. Calcification of the aorta. Degenerative changes in the spine and shoulders. Pigtail drainage catheter in the right upper quadrant. IMPRESSION: No active cardiopulmonary disease. Electronically Signed   By: Lucienne Capers M.D.   On: 02/03/2022 19:59   US Abdomen Limited  Result Date: 02/04/2022 CLINICAL DATA:  Sepsis.  Percutaneous cholecystostomy 11/04/2021. EXAM: ULTRASOUND ABDOMEN LIMITED RIGHT UPPER QUADRANT COMPARISON:  CT of 1 day prior FINDINGS: Gallbladder: Presumably decompressed by cholecystostomy catheter. Common bile duct: Diameter: Upper normal for age, 8 mm. Liver: Poorly visualized. No gross abnormality identified. Portal vein is patent on color Doppler imaging with normal direction of blood flow towards the liver. Other: Generally limited exam secondary to overlying bowel gas and bandaging. IMPRESSION: Limitations as detailed above. Gallbladder not well visualized, presumably decompressed by cholecystostomy catheter as on yesterday's CT. Electronically Signed   By: Abigail Miyamoto M.D.   On: 02/04/2022 13:07    Orson Eva, DO  [facility]  If  7PM-7AM, please contact night-coverage [email or web address] Password [email or web address] 02/05/2022, 1:00 PM   LOS: 1 day

## 2022-02-06 DIAGNOSIS — A0811 Acute gastroenteropathy due to Norwalk agent: Secondary | ICD-10-CM

## 2022-02-06 DIAGNOSIS — A419 Sepsis, unspecified organism: Secondary | ICD-10-CM | POA: Diagnosis not present

## 2022-02-06 DIAGNOSIS — R6520 Severe sepsis without septic shock: Secondary | ICD-10-CM | POA: Diagnosis not present

## 2022-02-06 LAB — CBC
HCT: 32.8 % — ABNORMAL LOW (ref 36.0–46.0)
Hemoglobin: 10 g/dL — ABNORMAL LOW (ref 12.0–15.0)
MCH: 30.4 pg (ref 26.0–34.0)
MCHC: 30.5 g/dL (ref 30.0–36.0)
MCV: 99.7 fL (ref 80.0–100.0)
Platelets: 97 10*3/uL — ABNORMAL LOW (ref 150–400)
RBC: 3.29 MIL/uL — ABNORMAL LOW (ref 3.87–5.11)
RDW: 14.6 % (ref 11.5–15.5)
WBC: 6.6 10*3/uL (ref 4.0–10.5)
nRBC: 0 % (ref 0.0–0.2)

## 2022-02-06 LAB — COMPREHENSIVE METABOLIC PANEL
ALT: 75 U/L — ABNORMAL HIGH (ref 0–44)
AST: 29 U/L (ref 15–41)
Albumin: 2 g/dL — ABNORMAL LOW (ref 3.5–5.0)
Alkaline Phosphatase: 88 U/L (ref 38–126)
Anion gap: 7 (ref 5–15)
BUN: 9 mg/dL (ref 8–23)
CO2: 25 mmol/L (ref 22–32)
Calcium: 8.1 mg/dL — ABNORMAL LOW (ref 8.9–10.3)
Chloride: 108 mmol/L (ref 98–111)
Creatinine, Ser: 0.76 mg/dL (ref 0.44–1.00)
GFR, Estimated: >60 mL/min (ref >60)
Glucose, Bld: 149 mg/dL — ABNORMAL HIGH (ref 70–99)
Potassium: 3.7 mmol/L (ref 3.5–5.1)
Sodium: 140 mmol/L (ref 135–145)
Total Bilirubin: 0.6 mg/dL (ref 0.3–1.2)
Total Protein: 5.7 g/dL — ABNORMAL LOW (ref 6.5–8.1)

## 2022-02-06 LAB — CULTURE, BLOOD (ROUTINE X 2): Special Requests: ADEQUATE

## 2022-02-06 LAB — GLUCOSE, CAPILLARY
Glucose-Capillary: 140 mg/dL — ABNORMAL HIGH (ref 70–99)
Glucose-Capillary: 203 mg/dL — ABNORMAL HIGH (ref 70–99)
Glucose-Capillary: 158 mg/dL — ABNORMAL HIGH (ref 70–99)

## 2022-02-06 LAB — MAGNESIUM: Magnesium: 1.8 mg/dL (ref 1.7–2.4)

## 2022-02-06 MED ORDER — METOPROLOL SUCCINATE ER 50 MG PO TB24
50.0000 mg | ORAL_TABLET | Freq: Every day | ORAL | Status: DC
  Administered 2022-02-06 – 2022-02-09 (×4): 50 mg via ORAL
  Filled 2022-02-06 (×4): qty 1

## 2022-02-06 MED ORDER — FUROSEMIDE 40 MG PO TABS
40.0000 mg | ORAL_TABLET | Freq: Every day | ORAL | Status: DC
  Administered 2022-02-06 – 2022-02-09 (×4): 40 mg via ORAL
  Filled 2022-02-06 (×4): qty 1

## 2022-02-06 MED ORDER — SODIUM CHLORIDE 0.9 % IV SOLN
2.0000 g | INTRAVENOUS | Status: DC
  Administered 2022-02-06 – 2022-02-08 (×3): 2 g via INTRAVENOUS
  Filled 2022-02-06 (×3): qty 20

## 2022-02-06 MED ORDER — GUAIFENESIN-DM 100-10 MG/5ML PO SYRP
5.0000 mL | ORAL_SOLUTION | ORAL | Status: DC | PRN
  Administered 2022-02-06 – 2022-02-08 (×2): 5 mL via ORAL
  Filled 2022-02-06 (×2): qty 5

## 2022-02-06 NOTE — Progress Notes (Signed)
Patient has had an uneventful night she slept well. Vital signs stables no new complaints at this time.

## 2022-02-06 NOTE — Assessment & Plan Note (Addendum)
Overall loose bowel movements have resolved Tolerating diet abd pain overall improving

## 2022-02-06 NOTE — Progress Notes (Signed)
Subjective: Patient denies any abdominal pain or nausea.  Tolerating regular diet well.  Objective: Vital signs in last 24 hours: Temp:  [98.2 F (36.8 C)-103.1 F (39.5 C)] 98.2 F (36.8 C) (02/12 0506) Pulse Rate:  [75-89] 81 (02/12 0506) Resp:  [18-20] 20 (02/12 0506) BP: (113-156)/(54-73) 138/66 (02/12 0506) SpO2:  [94 %-99 %] 95 % (02/12 0506) Last BM Date: 02/05/22  Intake/Output from previous day: 02/11 0701 - 02/12 0700 In: 500.8 [P.O.:240; IV Piggyback:260.8] Out: 40 [Drains:40] Intake/Output this shift: No intake/output data recorded.  General appearance: alert, cooperative, and no distress GI: Soft, nontender, nondistended.  Drain with 40 cc of bilious material present.  Lab Results:  Recent Labs    02/05/22 0543 02/06/22 0543  WBC 8.9 6.6  HGB 10.5* 10.0*  HCT 33.3* 32.8*  PLT 85* 97*   BMET Recent Labs    02/05/22 0543 02/06/22 0543  NA 142 140  K 3.5 3.7  CL 108 108  CO2 25 25  GLUCOSE 141* 149*  BUN 16 9  CREATININE 0.88 0.76  CALCIUM 7.8* 8.1*   PT/INR Recent Labs    02/03/22 2022  LABPROT 19.3*  INR 1.6*    Studies/Results: US Abdomen Limited  Result Date: 02/04/2022 CLINICAL DATA:  Sepsis.  Percutaneous cholecystostomy 11/04/2021. EXAM: ULTRASOUND ABDOMEN LIMITED RIGHT UPPER QUADRANT COMPARISON:  CT of 1 day prior FINDINGS: Gallbladder: Presumably decompressed by cholecystostomy catheter. Common bile duct: Diameter: Upper normal for age, 8 mm. Liver: Poorly visualized. No gross abnormality identified. Portal vein is patent on color Doppler imaging with normal direction of blood flow towards the liver. Other: Generally limited exam secondary to overlying bowel gas and bandaging. IMPRESSION: Limitations as detailed above. Gallbladder not well visualized, presumably decompressed by cholecystostomy catheter as on yesterday's CT. Electronically Signed   By: Abigail Miyamoto M.D.   On: 02/04/2022 13:07    Anti-infectives: Anti-infectives  (From admission, onward)    Start     Dose/Rate Route Frequency Ordered Stop   02/05/22 1300  meropenem (MERREM) 1 g in sodium chloride 0.9 % 100 mL IVPB        1 g 200 mL/hr over 30 Minutes Intravenous Every 8 hours 02/05/22 1205     02/04/22 2200  cefTRIAXone (ROCEPHIN) 2 g in sodium chloride 0.9 % 100 mL IVPB  Status:  Discontinued        2 g 200 mL/hr over 30 Minutes Intravenous Every 24 hours 02/04/22 1609 02/05/22 1205   02/04/22 0800  ceFEPIme (MAXIPIME) 2 g in sodium chloride 0.9 % 100 mL IVPB  Status:  Discontinued        2 g 200 mL/hr over 30 Minutes Intravenous Every 24 hours 02/03/22 2214 02/04/22 1609   02/04/22 0800  metroNIDAZOLE (FLAGYL) IVPB 500 mg  Status:  Discontinued        500 mg 100 mL/hr over 60 Minutes Intravenous Every 12 hours 02/04/22 0700 02/05/22 1456   02/03/22 2130  ertapenem (INVANZ) 1,000 mg in sodium chloride 0.9 % 100 mL IVPB  Status:  Discontinued        1 g 200 mL/hr over 30 Minutes Intravenous  Once 02/03/22 2120 02/03/22 2123   02/03/22 2130  aztreonam (AZACTAM) 2 g in sodium chloride 0.9 % 100 mL IVPB        2 g 200 mL/hr over 30 Minutes Intravenous  Once 02/03/22 2123 02/03/22 2216   02/03/22 2130  metroNIDAZOLE (FLAGYL) IVPB 500 mg        500  mg 100 mL/hr over 60 Minutes Intravenous  Once 02/03/22 2123 02/03/22 2318       Assessment/Plan: Impression: E. coli bacteremia of unknown etiology, though GI tract most likely source.  Liver enzyme tests have almost returned to normal.  Total bilirubin is 0.6.  Tolerating regular diet well.  Continue treatment for E. coli sepsis and positive C. difficile.  LOS: 2 days    Aviva Signs 02/06/2022

## 2022-02-06 NOTE — Progress Notes (Signed)
°  °       °PROGRESS NOTE ° °Nerida B Grills MRN:[id number] DOB: 10/02/1941 DOA: 02/03/2022 °PCP: [facility], [facility] ° °Brief History:  °81-year-old female with a history of HFpEF, coronary disease, diabetes mellitus type 2, complete heart block status post permanent pacemaker, hypertension, hyperlipidemia, pulmonary embolus on apixaban, and chronic cholecystitis presenting with 2-day history of abdominal pain and generalized weakness.  The patient is difficult historian at best.  History is supplemented by the patient's daughter and review of the medical record.  Apparently, the patient had a couple episodes of nausea and vomiting on 02/03/2022 with some loose stool.  There is no reports of hematochezia or melena.  The patient had subjective fevers and chills.  The patient denies any headache, chest pain, coughing, shortness of breath, hemoptysis.  She states that she has had some dysuria.  As result, the patient was brought to the emergency department for further evaluation.  The patient has had generalized weakness for about what she had difficulty getting out of bed.  At baseline, the patient is able to ambulate with a walker.  Her daughter states that she usually flushes the cholecystotomy tube 2 times per week.  There has not been any difficulty flushing it. °Notably, the patient was admitted to the hospital on 10/30/2021 to 11/05/2021 for recurrent acute acalculous cholecystitis.  A percutaneous cholecystotomy tube was again placed on 11/04/2021.  Notably, the patient also had a previous cholecystotomy tube placed on 07/08/2021.  It was removed on 09/22/2021 after 1 month capping trial. °In the emergency department, the patient had was hypotensive with a blood pressure 87/53.  BMP showed sodium 141, potassium 4.1, bicarbonate 23, serum creatinine 2.11.  WBC 22.3, hemoglobin 12.8, platelets 1-45,000.  CT of the abdomen and pelvis showed bibasilar scarring and atelectasis.  There was an unchanged  percutaneous drain in the gallbladder fossa without evidence of fluid collection or ongoing inflammation.  The patient was started on aztreonam and metronidazole in the ED.  Lactic acid peaked 4.1.  AST 372, ALT 301, alk phosphatase 157, total bilirubin 3.0.  She was admitted for further evaluation and treatment of sepsis.  Upon arrival to the medical floor, the patient was started on cefepime and metronidazole.  General surgery was consulted to assist with management whom recommended continuing nonoperative management as CT abd is re-assuring.  ° ° °Assessment and Plan: °* Severe sepsis (HCC)- (present on admission) °Present on admission °Patient presented with leukocytosis, tachycardia, and hypotension and acute kidney injury °Lactic acid 4.1>> 2.3 °Secondary to hepatobiliary source and urinary source °Initially on cefepime and metronidazole °2/11>>now on merrem pending final culture data>>>de-escalate to ceftriaxone °-persistent fever likely due to norovirus>>2 BM in last 24 hours ° °Acute on chronic cholecystitis- (present on admission) °Initially on metronidazole and cefepime °-now switched to merrem pending final cultures>>de-escalate to ceftriaxone °Cholecystotomy tube previously placed 11/04/2021 °General surgery consult appreciate>>continue nonoperative management °02/03/2022 CT abdomen as discussed above ° °AKI (acute kidney injury) (HCC)- (present on admission) °Baseline Cr at 0.7-0.9 °Presented with serum creatinine 2.11 °Secondary to sepsis and hemodynamic changes °Hold losartan °Continue IV fluids>>saline locked °Overall improved ° °Pyuria- (present on admission) °Concerned about UTI given the patient's dysuria and pyuria °UA 21-50 WBC °Follow urine culture--neg but collected after abx started ° °Thrombocytopenia (HCC)- (present on admission) °Platelets 145 at the time of admission °Secondary to sepsis °Anticipate improvement with treatment of infectious process ° °Chronic diastolic CHF (congestive heart  failure) (HCC)- (present on admission) °Appears clinically euvolemic °  euvolemic Holding furosemide temporarily due to hypotension and sepsis initially -restart lasix  Enteritis due to Norovirus 2 BMs in last 24 hours Tolerating diet abd pain overall improving  Hypomagnesemia Mag = 1.5 replete  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 Pt is clinically improved with improve abd pain and normalized WBC Antigen positive with neg toxin>>colonization  B12 deficiency Give 1 dose of IM B12 Start oral supplementation  Class 1 obesity- (present on admission) BMI 32.61 Lifestyle modification  Transaminitis- (present on admission) Due to sepsis and hemodynamic changes Overall improved   Mixed hyperlipidemia- (present on admission) Holding statin secondary to elevated LFTs  History of pulmonary embolus (PE)- (present on admission) Initially Holding apixaban temporarily in anticipation for any procedures 02/05/22--restart apixaban  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 [name] in the outpatient setting  Essential hypertension, benign- (present on admission) Holding nifedipine, hydralazine, losartan, metoprolol succinate secondary to hypotension Reintroduce antihypertensive medications as BP improves 02/05/22--restart metoprolol succinate  Controlled type 2 diabetes mellitus without complication, without long-term current use of insulin (HCC) 10/30/2021 hemoglobin A1c 6.8 NovoLog sliding scale Holding metformin         Status is: Inpatient Remains inpatient appropriate because: Severity of illness with hemodynamic instability and requirement for intravenous antibiotics             Family Communication:   sister and daughter updated 2/12   Consultants:  general surgery   Code Status:  FULL   DVT Prophylaxis:  apixaban     Procedures: As Listed in Progress Note Above   Antibiotics: Cefepime  2/10>>2/11 Metronidazole 2/10>>2/11 Merrem 2/11>>2/12 Ceftriaxone 2/12>>>         Subjective: States overall abd pain is improving.  2 BMs in last 24 hours.  Denies cp, n/v, sob.  Objective: Vitals:   02/05/22 1532 02/05/22 1810 02/05/22 2222 02/06/22 0506  BP:  (!) 113/54 140/73 138/66  Pulse:  89 86 81  Resp:  _0 Temp: 100 F (37.8 C) 98.8 F (37.1 C) (!) 100.5 F (38.1 C) 98.2 F (36.8 C)  TempSrc: Oral Oral Oral Oral  SpO2:  94% 94% 95%  Weight:      Height:        Intake/Output Summary (Last 24 hours) at 02/06/2022 1430 Last data filed at 02/06/2022 1300 Gross per 24 hour  Intake 1220.83 ml  Output 40 ml  Net 1180.83 ml   Weight change:  Exam:  General:  Pt is alert, follows commands appropriately, not in acute distress HEENT: No icterus, No thrush, No neck mass, Burns/AT Cardiovascular: RRR, S1/S2, no rubs, no gallops Respiratory: bibasilar crackles. No wheeze Abdomen: Soft/+BS, non tender, non distended, no guarding Extremities: No edema, No lymphangitis, No petechiae, No rashes, no synovitis   Data Reviewed: I have personally reviewed following labs and imaging studies Basic Metabolic Panel: Recent Labs  Lab 02/03/22 2022 02/04/22 0334 02/05/22 0543 02/06/22 0543  NA 141 141 142 140  K 4.1 4.3 3.5 3.7  CL 103 110 108 108  CO2 _1 GLUCOSE 121* 159* 141* 149*  BUN 22 26* 16 9  CREATININE 2.11* 1.69* 0.88 0.76  CALCIUM 8.8* 7.8* 7.8* 8.1*  MG  --  1.5* 1.5* 1.8  PHOS  --  3.9  --   --    Liver Function Tests: Recent Labs  Lab 02/03/22 2022 02/04/22 0334 02/05/22 0543 02/06/22 0543  AST 372* 219* 66* 29  ALT 301* 223* 116*  ALKPHOS 157* 124 99 88  °BILITOT 3.0* 1.9* 0.9 0.6  °PROT 7.7 6.3* 5.5* 5.7*  °ALBUMIN 3.4* 2.7* 2.1* 2.0*  ° °No results for input(s): LIPASE, AMYLASE in the last 168 hours. °No results for input(s): AMMONIA in the last 168 hours. °Coagulation Profile: °Recent Labs  °Lab 02/03/22 °2022  °INR 1.6*   ° °CBC: °Recent Labs  °Lab 02/03/22 °2022 02/04/22 °0334 02/05/22 °0543 02/06/22 °0543  °WBC 22.3* 20.3* 8.9 6.6  °NEUTROABS 19.6*  --   --   --   °HGB 12.8 10.8* 10.5* 10.0*  °HCT 40.8 35.1* 33.3* 32.8*  °MCV 102.8* 104.5* 102.1* 99.7  °PLT 145* 114* 85* 97*  ° °Cardiac Enzymes: °No results for input(s): CKTOTAL, CKMB, CKMBINDEX, TROPONINI in the last 168 hours. °BNP: °Invalid input(s): POCBNP °CBG: °Recent Labs  °Lab 02/05/22 °0801 02/05/22 °1118 02/05/22 °1720 02/05/22 °2221 02/06/22 °0734  °GLUCAP 185* 169* 138* 196* 140*  ° °HbA1C: °Recent Labs  °  02/03/22 °2022 02/05/22 °0544  °HGBA1C 6.5* 6.6*  ° °Urine analysis: °   °Component Value Date/Time  ° COLORURINE AMBER (A) 02/04/2022 0310  ° APPEARANCEUR HAZY (A) 02/04/2022 0310  ° LABSPEC 1.020 02/04/2022 0310  ° PHURINE 5.0 02/04/2022 0310  ° GLUCOSEU 50 (A) 02/04/2022 0310  ° HGBUR SMALL (A) 02/04/2022 0310  ° BILIRUBINUR NEGATIVE 02/04/2022 0310  ° KETONESUR NEGATIVE 02/04/2022 0310  ° PROTEINUR 100 (A) 02/04/2022 0310  ° UROBILINOGEN 0.2 06/18/2015 2300  ° NITRITE NEGATIVE 02/04/2022 0310  ° LEUKOCYTESUR NEGATIVE 02/04/2022 0310  ° °Sepsis Labs: °@LABRCNTIP(procalcitonin:4,lacticidven:4) °) °Recent Results (from the past 240 hour(s))  °Culture, blood (Routine x 2)     Status: Abnormal  ° Collection Time: 02/03/22  8:22 PM  ° Specimen: BLOOD RIGHT HAND  °Result Value Ref Range Status  ° Specimen Description   Final  °  BLOOD RIGHT HAND °Performed at [facility], [address] [place], [place] [address] °  ° Special Requests   Final  °  BOTTLES DRAWN AEROBIC AND ANAEROBIC Blood Culture results may not be optimal due to an inadequate volume of blood received in culture bottles °Performed at [facility], [address] [place], [place] [address] °  ° Culture  Setup Time   Final  °  GRAM NEGATIVE RODS IN BOTH AEROBIC AND ANAEROBIC BOTTLES °Gram Stain Report Called to,Read Back By and Verified With: LOOMIS @ 0909 ON [id number] BY HENDERSON L °CRITICAL RESULT CALLED  TO, READ BACK BY AND VERIFIED WITH: °C,MCGIVVONY RN @1522 02/04/22 EB °Performed at [facility], [address] [place], [place] [address] °  ° Culture ESCHERICHIA COLI (A)  Final  ° Report Status 02/06/2022 FINAL  Final  ° Organism ID, Bacteria ESCHERICHIA COLI  Final  °    Susceptibility  ° Escherichia coli - MIC*  °  AMPICILLIN <=2 SENSITIVE Sensitive   °  CEFAZOLIN <=4 SENSITIVE Sensitive   °  CEFEPIME <=0.12 SENSITIVE Sensitive   °  CEFTAZIDIME <=1 SENSITIVE Sensitive   °  CEFTRIAXONE <=0.25 SENSITIVE Sensitive   °  CIPROFLOXACIN <=0.25 SENSITIVE Sensitive   °  GENTAMICIN <=1 SENSITIVE Sensitive   °  IMIPENEM <=0.25 SENSITIVE Sensitive   °  TRIMETH/SULFA <=20 SENSITIVE Sensitive   °  AMPICILLIN/SULBACTAM <=2 SENSITIVE Sensitive   °  PIP/TAZO <=4 SENSITIVE Sensitive   °  * ESCHERICHIA COLI  °Culture, blood (Routine x 2)     Status: Abnormal  ° Collection Time: 02/03/22  8:22 PM  ° Specimen: Left   Antecubital; Blood  °Result Value Ref Range Status  ° Specimen Description   Final  °  LEFT ANTECUBITAL °Performed at [facility], [address] [place], [place] [address] °  ° Special Requests   Final  °  BOTTLES DRAWN AEROBIC AND ANAEROBIC Blood Culture adequate volume °Performed at [facility], [address] [place], [place] [address] °  ° Culture  Setup Time   Final  °  GRAM NEGATIVE RODS IN BOTH AEROBIC AND ANAEROBIC BOTTLES °Gram Stain Report Called to,Read Back By and Verified With: LOOMIS @ 0909 ON 021023 BY HENDERSON L °Performed at [facility], [address] , [place] [address] °  ° Culture (A)  Final  °  ESCHERICHIA COLI °SUSCEPTIBILITIES PERFORMED ON PREVIOUS CULTURE WITHIN THE LAST 5 DAYS. °Performed at [facility], [address] [place], [place] [address] °  ° Report Status 02/06/2022 FINAL  Final  °Blood Culture ID Panel (Reflexed)     Status: Abnormal  ° Collection Time: 02/03/22  8:22 PM  °Result Value Ref Range Status  ° Enterococcus faecalis NOT DETECTED NOT DETECTED Final   ° Enterococcus Faecium NOT DETECTED NOT DETECTED Final  ° Listeria monocytogenes NOT DETECTED NOT DETECTED Final  ° Staphylococcus species NOT DETECTED NOT DETECTED Final  ° Staphylococcus aureus (BCID) NOT DETECTED NOT DETECTED Final  ° Staphylococcus epidermidis NOT DETECTED NOT DETECTED Final  ° Staphylococcus lugdunensis NOT DETECTED NOT DETECTED Final  ° Streptococcus species NOT DETECTED NOT DETECTED Final  ° Streptococcus agalactiae NOT DETECTED NOT DETECTED Final  ° Streptococcus pneumoniae NOT DETECTED NOT DETECTED Final  ° Streptococcus pyogenes NOT DETECTED NOT DETECTED Final  ° A.calcoaceticus-baumannii NOT DETECTED NOT DETECTED Final  ° Bacteroides fragilis NOT DETECTED NOT DETECTED Final  ° Enterobacterales DETECTED (A) NOT DETECTED Final  °  Comment: Enterobacterales represent a large order of gram negative bacteria, not a single organism. °CRITICAL RESULT CALLED TO, READ BACK BY AND VERIFIED WITH: °C,MCGIVVONY RN @1522 02/04/22 EB °  ° Enterobacter cloacae complex NOT DETECTED NOT DETECTED Final  ° Escherichia coli DETECTED (A) NOT DETECTED Final  °  Comment: CRITICAL RESULT CALLED TO, READ BACK BY AND VERIFIED WITH: °C,MCGIVVONY RN @1522 02/04/22 EB °  ° Klebsiella aerogenes NOT DETECTED NOT DETECTED Final  ° Klebsiella oxytoca NOT DETECTED NOT DETECTED Final  ° Klebsiella pneumoniae NOT DETECTED NOT DETECTED Final  ° Proteus species NOT DETECTED NOT DETECTED Final  ° Salmonella species NOT DETECTED NOT DETECTED Final  ° Serratia marcescens NOT DETECTED NOT DETECTED Final  ° Haemophilus influenzae NOT DETECTED NOT DETECTED Final  ° Neisseria meningitidis NOT DETECTED NOT DETECTED Final  ° Pseudomonas aeruginosa NOT DETECTED NOT DETECTED Final  ° Stenotrophomonas maltophilia NOT DETECTED NOT DETECTED Final  ° Candida albicans NOT DETECTED NOT DETECTED Final  ° Candida auris NOT DETECTED NOT DETECTED Final  ° Candida glabrata NOT DETECTED NOT DETECTED Final  ° Candida krusei NOT DETECTED NOT DETECTED  Final  ° Candida parapsilosis NOT DETECTED NOT DETECTED Final  ° Candida tropicalis NOT DETECTED NOT DETECTED Final  ° Cryptococcus neoformans/gattii NOT DETECTED NOT DETECTED Final  ° CTX-M ESBL NOT DETECTED NOT DETECTED Final  ° Carbapenem resistance IMP NOT DETECTED NOT DETECTED Final  ° Carbapenem resistance KPC NOT DETECTED NOT DETECTED Final  ° Carbapenem resistance NDM NOT DETECTED NOT DETECTED Final  ° Carbapenem resist OXA 48 LIKE NOT DETECTED NOT DETECTED Final  ° Carbapenem resistance VIM NOT DETECTED NOT DETECTED Final  °  Comment:   Performed at [facility], [address] [place], [place] [address]  °Resp Panel by RT-PCR (Flu A&B, Covid) Nasopharyngeal Swab     Status: None  ° Collection Time: 02/03/22  9:31 PM  ° Specimen: Nasopharyngeal Swab; Nasopharyngeal(NP) swabs in vial transport medium  °Result Value Ref Range Status  ° SARS Coronavirus 2 by RT PCR NEGATIVE NEGATIVE Final  °  Comment: (NOTE) °SARS-CoV-2 target nucleic acids are NOT DETECTED. ° °The SARS-CoV-2 RNA is generally detectable in upper respiratory °specimens during the acute phase of infection. The lowest °concentration of SARS-CoV-2 viral copies this assay can detect is °138 copies/mL. A negative result does not preclude SARS-Cov-2 °infection and should not be used as the sole basis for treatment or °other patient management decisions. A negative result may occur with  °improper specimen collection/handling, submission of specimen other °than nasopharyngeal swab, presence of viral mutation(s) within the °areas targeted by this assay, and inadequate number of viral °copies(<138 copies/mL). A negative result must be combined with °clinical observations, patient history, and epidemiological °information. The expected result is Negative. ° °Fact Sheet for Patients:  °https://www.fda.gov/media/152166/download ° °Fact Sheet for Healthcare Providers:  °https://www.fda.gov/media/152162/download ° °This test is no t yet approved or  cleared by the [place] FDA and  °has been authorized for detection and/or diagnosis of SARS-CoV-2 by °FDA under an Emergency Use Authorization (EUA). This EUA will remain  °in effect (meaning this test can be used) for the duration of the °COVID-19 declaration under Section 564(b)(1) of the Act, 21 °U.S.C.section 360bbb-3(b)(1), unless the authorization is terminated  °or revoked sooner.  ° ° °  ° Influenza A by PCR NEGATIVE NEGATIVE Final  ° Influenza B by PCR NEGATIVE NEGATIVE Final  °  Comment: (NOTE) °The Xpert Xpress SARS-CoV-2/FLU/RSV plus assay is intended as an aid °in the diagnosis of influenza from Nasopharyngeal swab specimens and °should not be used as a sole basis for treatment. Nasal washings and °aspirates are unacceptable for Xpert Xpress SARS-CoV-2/FLU/RSV °testing. ° °Fact Sheet for Patients: °https://www.fda.gov/media/152166/download ° °Fact Sheet for Healthcare Providers: °https://www.fda.gov/media/152162/download ° °This test is not yet approved or cleared by the [place] FDA and °has been authorized for detection and/or diagnosis of SARS-CoV-2 by °FDA under an Emergency Use Authorization (EUA). This EUA will remain °in effect (meaning this test can be used) for the duration of the °COVID-19 declaration under Section 564(b)(1) of the Act, 21 U.S.C. °section 360bbb-3(b)(1), unless the authorization is terminated or °revoked. ° °Performed at [facility], [address] [place], [place] [address] °  °MRSA Next Gen by PCR, Nasal     Status: None  ° Collection Time: 02/04/22  2:58 AM  °Result Value Ref Range Status  ° MRSA by PCR Next Gen NOT DETECTED NOT DETECTED Final  °  Comment: (NOTE) °The GeneXpert MRSA Assay (FDA approved for NASAL specimens only), °is one component of a comprehensive MRSA colonization surveillance °program. It is not intended to diagnose MRSA infection nor to guide °or monitor treatment for MRSA infections. °Test performance is not FDA approved in patients less  than 2 years °old. °Performed at [facility], [address] [place], [place] [address] °  °Urine Culture     Status: None  ° Collection Time: 02/04/22  3:10 AM  ° Specimen: Urine, Clean Catch  °Result Value Ref Range Status  ° Specimen Description   Final  °  URINE, CLEAN CATCH °Performed at [facility], [address] [place], [place] [address] °  °   Special Requests   Final  °  NONE °Performed at [facility], [address] [place], [place] [address] °  ° Culture   Final  °  NO GROWTH °Performed at [facility], [address] [place], [place] [address] °  ° Report Status 02/05/2022 FINAL  Final  °C Difficile Quick Screen w PCR reflex     Status: Abnormal  ° Collection Time: 02/04/22  4:41 PM  ° Specimen: STOOL  °Result Value Ref Range Status  ° C Diff antigen POSITIVE (A) NEGATIVE Final  ° C Diff toxin NEGATIVE NEGATIVE Final  ° C Diff interpretation Results are indeterminate. See PCR results.  Final  °  Comment: Performed at [facility], [address] [place], [place] [address]  °Gastrointestinal Panel by PCR , Stool     Status: Abnormal  ° Collection Time: 02/04/22  4:41 PM  ° Specimen: STOOL  °Result Value Ref Range Status  ° Campylobacter species NOT DETECTED NOT DETECTED Final  ° Plesimonas shigelloides NOT DETECTED NOT DETECTED Final  ° Salmonella species NOT DETECTED NOT DETECTED Final  ° Yersinia enterocolitica NOT DETECTED NOT DETECTED Final  ° Vibrio species NOT DETECTED NOT DETECTED Final  ° Vibrio cholerae NOT DETECTED NOT DETECTED Final  ° Enteroaggregative E coli (EAEC) NOT DETECTED NOT DETECTED Final  ° Enteropathogenic E coli (EPEC) NOT DETECTED NOT DETECTED Final  ° Enterotoxigenic E coli (ETEC) NOT DETECTED NOT DETECTED Final  ° Shiga like toxin producing E coli (STEC) NOT DETECTED NOT DETECTED Final  ° Shigella/Enteroinvasive E coli (EIEC) NOT DETECTED NOT DETECTED Final  ° Cryptosporidium NOT DETECTED NOT DETECTED Final  ° Cyclospora cayetanensis NOT DETECTED NOT DETECTED Final  °  Entamoeba histolytica NOT DETECTED NOT DETECTED Final  ° Giardia lamblia NOT DETECTED NOT DETECTED Final  ° Adenovirus F40/41 NOT DETECTED NOT DETECTED Final  ° Astrovirus NOT DETECTED NOT DETECTED Final  ° Norovirus GI/GII DETECTED (A) NOT DETECTED Final  °  Comment: RESULT CALLED TO, READ BACK BY AND VERIFIED WITH: °PHOEBE BROWN @2109 ON 02/05/22 SKL °  ° Rotavirus A NOT DETECTED NOT DETECTED Final  ° Sapovirus (I, II, IV, and V) NOT DETECTED NOT DETECTED Final  °  Comment: Performed at [facility], [address] [place], [place] [address]  °C. Diff by PCR, Reflexed     Status: Abnormal  ° Collection Time: 02/04/22  4:41 PM  °Result Value Ref Range Status  ° Toxigenic C. Difficile by PCR POSITIVE (A) NEGATIVE Final  °  Comment: Positive for toxigenic C. difficile with little to no toxin production. Only treat if clinical presentation suggests symptomatic illness. °Performed at [facility], [address] [place], [place] [address] °  °Culture, blood (routine x 2)     Status: None (Preliminary result)  ° Collection Time: 02/05/22  3:05 PM  ° Specimen: BLOOD RIGHT HAND  °Result Value Ref Range Status  ° Specimen Description   Final  °  BLOOD RIGHT HAND BOTTLES DRAWN AEROBIC AND ANAEROBIC  ° Special Requests   Final  °  Blood Culture results may not be optimal due to an excessive volume of blood received in culture bottles  ° Culture   Final  °  NO GROWTH < 24 HOURS °Performed at [facility], [address] [place], [place] [address] °  ° Report Status PENDING  Incomplete  °Culture, blood (routine x 2)     Status: None (Preliminary result)  ° Collection Time: 02/05/22    3:19 PM   Specimen: BLOOD RIGHT ARM  Result Value Ref Range Status   Specimen Description   Final    BLOOD RIGHT ARM BOTTLES DRAWN AEROBIC AND ANAEROBIC   Special Requests   Final    Blood Culture results may not be optimal due to an excessive volume of blood received in culture bottles   Culture   Final    NO GROWTH < 24  HOURS Performed at [facility], [address] [place], [place] [address]    Report Status PENDING  Incomplete     Scheduled Meds:  apixaban  5 mg Oral BID   Chlorhexidine Gluconate Cloth  6 each Topical Q0600   furosemide  40 mg Oral Daily   insulin aspart  0-5 Units Subcutaneous QHS   insulin aspart  0-9 Units Subcutaneous TID WC   metoprolol succinate  50 mg Oral Daily   vitamin B-12  500 mcg Oral Daily   Continuous Infusions:  cefTRIAXone (ROCEPHIN)  IV      Procedures/Studies: CT ABDOMEN PELVIS WO CONTRAST  Result Date: 02/03/2022 CLINICAL DATA:  Abdominal pain, acute, nonlocalized. Emesis and diarrhea beginning last night. Generalized weakness. Gallbladder drain in place. EXAM: CT ABDOMEN AND PELVIS WITHOUT CONTRAST TECHNIQUE: Multidetector CT imaging of the abdomen and pelvis was performed following the standard protocol without IV contrast. RADIATION DOSE REDUCTION: This exam was performed according to the departmental dose-optimization program which includes automated exposure control, adjustment of the mA and/or kV according to patient size and/or use of iterative reconstruction technique. COMPARISON:  10/30/2021 FINDINGS: Lower chest: Mild atelectasis and or scarring at the lung bases as seen previously. Cardiomegaly. Pacemaker in place. Hepatobiliary: Liver cysts seen in the right lobe, unchanged. Percutaneous drain in the gallbladder fossa without evidence of fluid collection or ongoing inflammatory change. Is this drain being actively managed? Pancreas: Normal Spleen: Normal Adrenals/Urinary Tract: Adrenal glands are normal. Kidneys are normal. Bladder is normal. Stomach/Bowel: Stomach and small intestine are normal. Normal appendix. Ordinary diverticulosis of the left colon without evidence of diverticulitis. Vascular/Lymphatic: Aortic atherosclerosis. No aneurysm. IVC is normal. No adenopathy. Reproductive: No pelvic mass. Other: No free fluid or air. Musculoskeletal: Ordinary  spinal curvature and degenerative change. IMPRESSION: Drain remains in place in the gallbladder fossa region. No evidence of abscess or residual inflammation in that region. Is this catheter being actively managed? Aortic Atherosclerosis (ICD10-I70.0). No other significant or acute finding. Electronically Signed   By: Nelson Chimes M.D.   On: 02/03/2022 22:25   DG Chest 2 View  Result Date: 02/03/2022 CLINICAL DATA:  Suspected sepsis.  Vomiting, diarrhea, weakness EXAM: CHEST - 2 VIEW COMPARISON:  08/22/2021 FINDINGS: Cardiac pacemaker. Heart size and pulmonary vascularity are normal. Lungs are clear. No pleural effusions. No pneumothorax. Mediastinal contours appear intact. Calcification of the aorta. Degenerative changes in the spine and shoulders. Pigtail drainage catheter in the right upper quadrant. IMPRESSION: No active cardiopulmonary disease. Electronically Signed   By: Lucienne Capers M.D.   On: 02/03/2022 19:59   US Abdomen Limited  Result Date: 02/04/2022 CLINICAL DATA:  Sepsis.  Percutaneous cholecystostomy 11/04/2021. EXAM: ULTRASOUND ABDOMEN LIMITED RIGHT UPPER QUADRANT COMPARISON:  CT of 1 day prior FINDINGS: Gallbladder: Presumably decompressed by cholecystostomy catheter. Common bile duct: Diameter: Upper normal for age, 8 mm. Liver: Poorly visualized. No gross abnormality identified. Portal vein is patent on color Doppler imaging with normal direction of blood flow towards the liver. Other: Generally limited exam secondary to overlying bowel gas and bandaging. IMPRESSION: Limitations  as detailed above. Gallbladder not well visualized, presumably decompressed by cholecystostomy catheter as on yesterday's CT. Electronically Signed   By: Abigail Miyamoto M.D.   On: 02/04/2022 13:07    Orson Eva, DO  [facility]  If 7PM-7AM, please contact night-coverage [email or web address] Password [email or web address] 02/06/2022, 2:30 PM   LOS: 2 days

## 2022-02-07 LAB — GLUCOSE, CAPILLARY
Glucose-Capillary: 145 mg/dL — ABNORMAL HIGH (ref 70–99)
Glucose-Capillary: 189 mg/dL — ABNORMAL HIGH (ref 70–99)
Glucose-Capillary: 155 mg/dL — ABNORMAL HIGH (ref 70–99)
Glucose-Capillary: 177 mg/dL — ABNORMAL HIGH (ref 70–99)

## 2022-02-07 NOTE — Plan of Care (Signed)
°  Problem: Acute Rehab PT Goals(only PT should resolve) Goal: Pt Will Go Supine/Side To Sit Outcome: Progressing Flowsheets (Taken 02/07/2022 1609) Pt will go Supine/Side to Sit:  with min guard assist  with supervision Goal: Patient Will Transfer Sit To/From Stand Outcome: Progressing Flowsheets (Taken 02/07/2022 1609) Patient will transfer sit to/from stand:  with supervision  with min guard assist Goal: Pt Will Transfer Bed To Chair/Chair To Bed Outcome: Progressing Flowsheets (Taken 02/07/2022 1609) Pt will Transfer Bed to Chair/Chair to Bed: min guard assist Goal: Pt Will Ambulate Outcome: Progressing Flowsheets (Taken 02/07/2022 1609) Pt will Ambulate:  75 feet  with min guard assist  with minimal assist  with rolling walker   4:09 PM, 02/07/22 Lonell Grandchild, MPT Physical Therapist with [facility] 336 [phone] office [phone] mobile phone

## 2022-02-07 NOTE — TOC Progression Note (Signed)
Transition of Care [facility]) - Progression Note    Patient Details  Name: Natalie Castro MRN: [id number] Date of Birth: [birth year]  [facility] CM/SW Contact  Salome Arnt, [place] Phone Number: 02/07/2022, 3:50 PM  Clinical Narrative:  PT recommending SNF. LCSW discussed with pt's daughter as pt was not available at the time. Pt's daughter had discussed with pt and they are agreeable to short term rehab. Pt has been to [facility] in the past. They request [facility] or [facility] SNF. Referral sent. CMA to start auth.      Expected Discharge Plan: Home/Self Care Barriers to Discharge: Continued Medical Work up  Expected Discharge Plan and Services Expected Discharge Plan: Home/Self Care In-house Referral: Clinical Social Work Discharge Planning Services: CM Consult   Living arrangements for the past 2 months: Single Family Home                                       Social Determinants of Health (SDOH) Interventions    Readmission Risk Interventions Readmission Risk Prevention Plan 07/11/2021  Transportation Screening Complete  PCP or Specialist Appt within 5-7 Days Complete  Home Care Screening Complete  Medication Review (RN CM) Complete  Some recent data might be hidden

## 2022-02-07 NOTE — [place] FL2 (Signed)
MEDICAID FL2 LEVEL OF CARE SCREENING TOOL     IDENTIFICATION  Patient Name: Natalie Castro Birthdate: Nov 20, 1941 Sex: female Admission Date (Current Location): 02/03/2022  [facility] [place] Number:  [employer] and Address:  [place] [address], [place]      Provider Number: [id number]  Attending Physician Name and Address:  Orson Eva, MD  Relative Name and Phone Number:       Current Level of Care: Hospital Recommended Level of Care: [place] Prior Approval Number:    Date Approved/Denied:   PASRR Number: [id number]  Discharge Plan: SNF    Current Diagnoses: Patient Active Problem List   Diagnosis Date Noted   Enteritis due to Norovirus 02/06/2022   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   Hypomagnesemia 02/05/2022   Chronic cholecystitis    Severe sepsis ([place]) 02/04/2022   Acute on chronic cholecystitis 02/04/2022   Thrombocytopenia ([place]) 02/04/2022   Lactic acidosis 02/04/2022   Transaminitis 02/04/2022   Elevated MCV 02/04/2022   Pyuria 02/04/2022   Class 1 obesity 02/04/2022   B12 deficiency 02/04/2022   Abdominal pain, right upper quadrant    Acute cholecystitis without calculus 10/30/2021   Acute exacerbation of CHF (congestive heart failure) ([place]) 08/21/2021   Chronic diastolic CHF (congestive heart failure) ([place]) 08/21/2021   Bacteremia 07/05/2021   AKI (acute kidney injury) ([place]) 07/05/2021   Pancreatic duct dilated    Protein-calorie malnutrition, mild ([place]) 07/02/2021   Chest pain 07/02/2021   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   Uncontrolled type 2 diabetes mellitus with hyperglycemia ([place]) 04/06/2020   Mixed hyperlipidemia 11/04/2019   Need for immunization against influenza 11/04/2019   Positive FIT (fecal immunochemical test) 04/26/2018   History of pulmonary embolus (PE) 11/04/2016   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 See d/c summary for updates.)  AMBULATORY STATUS COMMUNICATION OF NEEDS Skin   Extensive Assist Verbally Normal                       Personal Care Assistance Level of Assistance  Bathing, Feeding, Dressing Bathing Assistance: Maximum assistance Feeding assistance: Limited assistance Dressing Assistance: Maximum assistance     Functional Limitations Info  Sight, Hearing, Speech Sight Info: Impaired Hearing Info: Adequate Speech Info: Adequate    SPECIAL CARE FACTORS FREQUENCY  PT (By licensed PT)     PT Frequency: 5x weekly              Contractures      Additional Factors Info  Code Status, Allergies Code Status Info: Full code Allergies Info: Penicillins           Current Medications (02/07/2022):  This is the current hospital active medication list Current Facility-Administered Medications  Medication Dose Route Frequency Provider Last Rate Last Admin   acetaminophen (TYLENOL) tablet 650 mg  650 mg Oral Q6H PRN Adefeso, Oladapo, DO   650 mg at 02/06/22 2114   Or   acetaminophen (TYLENOL) suppository  650 mg  650 mg Rectal Q6H PRN Adefeso, Oladapo, DO       apixaban (ELIQUIS) tablet 5 mg  5 mg Oral BID Tat, David, MD   5 mg at 02/07/22 0946   cefTRIAXone (ROCEPHIN) 2 g in sodium chloride 0.9 % 100 mL IVPB  2 g Intravenous Q24H Orson Eva, MD 200 mL/hr at 02/06/22 1521 2 g at 02/06/22 1521   Chlorhexidine Gluconate Cloth 2 % PADS 6 each  6 each Topical Q0600 Adefeso, Oladapo, DO   6 each at 02/05/22 0522    furosemide (LASIX) tablet 40 mg  40 mg Oral Daily Tat, David, MD   40 mg at 02/07/22 0946   guaiFENesin-dextromethorphan (ROBITUSSIN DM) 100-10 MG/5ML syrup 5 mL  5 mL Oral Q4H PRN Tat, Shanon Brow, MD   5 mL at 02/06/22 2258   insulin aspart (novoLOG) injection 0-5 Units  0-5 Units Subcutaneous QHS Adefeso, Oladapo, DO       insulin aspart (novoLOG) injection 0-9 Units  0-9 Units Subcutaneous TID WC Adefeso, Oladapo, DO   2 Units at 02/07/22 1308   metoprolol succinate (TOPROL-XL) 24 hr tablet 50 mg  50 mg Oral Daily Tat, David, MD   50 mg at 02/07/22 0946   vitamin B-12 (CYANOCOBALAMIN) tablet 500 mcg  500 mcg Oral Daily Tat, Shanon Brow, MD   500 mcg at 02/07/22 3785     Discharge Medications: Please see discharge summary for a list of discharge medications.  Relevant Imaging Results:  Relevant Lab Results:   Additional Information SSN: [id number]. Pt has had Moderna COVID vaccines 02/25/20 and 03/25/20.  Salome Arnt, LCSW

## 2022-02-07 NOTE — Progress Notes (Signed)
°  °       °PROGRESS NOTE ° °Natalie Castro MRN:[id number] DOB: 12/12/1941 DOA: 02/03/2022 °PCP: [facility], [facility] ° °Brief History:  °81-year-old female with a history of HFpEF, coronary disease, diabetes mellitus type 2, complete heart block status post permanent pacemaker, hypertension, hyperlipidemia, pulmonary embolus on apixaban, and chronic cholecystitis presenting with 2-day history of abdominal pain and generalized weakness.  The patient is difficult historian at best.  History is supplemented by the patient's daughter and review of the medical record.  Apparently, the patient had a couple episodes of nausea and vomiting on 02/03/2022 with some loose stool.  There is no reports of hematochezia or melena.  The patient had subjective fevers and chills.  The patient denies any headache, chest pain, coughing, shortness of breath, hemoptysis.  She states that she has had some dysuria.  As result, the patient was brought to the emergency department for further evaluation.  The patient has had generalized weakness for about what she had difficulty getting out of bed.  At baseline, the patient is able to ambulate with a walker.  Her daughter states that she usually flushes the cholecystotomy tube 2 times per week.  There has not been any difficulty flushing it. °Notably, the patient was admitted to the hospital on 10/30/2021 to 11/05/2021 for recurrent acute acalculous cholecystitis.  A percutaneous cholecystotomy tube was again placed on 11/04/2021.  Notably, the patient also had a previous cholecystotomy tube placed on 07/08/2021.  It was removed on 09/22/2021 after 1 month capping trial. °In the emergency department, the patient had was hypotensive with a blood pressure 87/53.  BMP showed sodium 141, potassium 4.1, bicarbonate 23, serum creatinine 2.11.  WBC 22.3, hemoglobin 12.8, platelets 1-45,000.  CT of the abdomen and pelvis showed bibasilar scarring and atelectasis.  There was an unchanged  percutaneous drain in the gallbladder fossa without evidence of fluid collection or ongoing inflammation.  The patient was started on aztreonam and metronidazole in the ED.  Lactic acid peaked 4.1.  AST 372, ALT 301, alk phosphatase 157, total bilirubin 3.0.  She was admitted for further evaluation and treatment of sepsis.  Upon arrival to the medical floor, the patient was started on cefepime and metronidazole.  General surgery was consulted to assist with management whom recommended continuing nonoperative management as CT abd is re-assuring.  ° ° °Assessment and Plan: °* Severe sepsis (HCC)- (present on admission) °Present on admission °Patient presented with leukocytosis, tachycardia, and hypotension and acute kidney injury °Lactic acid 4.1>> 2.3 °Secondary to hepatobiliary source and urinary source °Initially on cefepime and metronidazole °2/11>>now on merrem pending final culture data>>>de-escalate to ceftriaxone °-persistent fever likely due to norovirus>>2 BM in last 24 hours ° °Acute on chronic cholecystitis- (present on admission) °Initially on metronidazole and cefepime °-now switched to merrem pending final cultures>>de-escalate to ceftriaxone °Cholecystotomy tube previously placed 11/04/2021 °General surgery consult appreciate>>continue nonoperative management °02/03/2022 CT abdomen as discussed above ° °AKI (acute kidney injury) (HCC)- (present on admission) °Baseline Cr at 0.7-0.9 °Presented with serum creatinine 2.11 °Secondary to sepsis and hemodynamic changes °Hold losartan °Continue IV fluids>>saline locked °Overall improved ° °Pyuria- (present on admission) °Concerned about UTI given the patient's dysuria and pyuria °UA 21-50 WBC °Follow urine culture--neg but collected after abx started ° °Thrombocytopenia (HCC)- (present on admission) °Platelets 145 at the time of admission °Secondary to sepsis °Anticipate improvement with treatment of infectious process ° °Chronic diastolic CHF (congestive heart  failure) (HCC)- (present on admission) °Appears clinically euvolemic °  Holding furosemide temporarily due to hypotension and sepsis initially -restart lasix  Enteritis due to Norovirus 2 BMs in last 24 hours Tolerating diet abd pain overall improving  Hypomagnesemia Mag = 1.5 replete  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 Pt is clinically improved with improve abd pain and normalized WBC Antigen positive with neg toxin>>colonization  B12 deficiency Give 1 dose of IM B12 Start oral supplementation  Class 1 obesity- (present on admission) BMI 32.61 Lifestyle modification  Transaminitis- (present on admission) Due to sepsis and hemodynamic changes Overall improved   Mixed hyperlipidemia- (present on admission) Holding statin secondary to elevated LFTs  History of pulmonary embolus (PE)- (present on admission) Initially Holding apixaban temporarily in anticipation for any procedures 02/05/22--restart apixaban  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 [name] in the outpatient setting  Essential hypertension, benign- (present on admission) Holding nifedipine, hydralazine, losartan, metoprolol succinate secondary to hypotension Reintroduce antihypertensive medications as BP improves 02/05/22--restart metoprolol succinate  Controlled type 2 diabetes mellitus without complication, without long-term current use of insulin (HCC) 10/30/2021 hemoglobin A1c 6.8 NovoLog sliding scale Holding metformin        Status is: Inpatient Remains inpatient appropriate because: Severity of illness with hemodynamic instability and requirement for intravenous antibiotics             Family Communication:   sister and daughter updated 2/12   Consultants:  general surgery   Code Status:  FULL   DVT Prophylaxis:  apixaban     Procedures: As Listed in Progress Note Above   Antibiotics: Cefepime  2/10>>2/11 Metronidazole 2/10>>2/11 Merrem 2/11>>2/12 Ceftriaxone 2/12>>>        Subjective: States overall abd pain is improved.  Denies f/c, cp, sob, n/v.  2 BM in last 24 hours  Objective: Vitals:   02/06/22 1446 02/06/22 1941 02/07/22 0628 02/07/22 1511  BP: (!) 153/68 (!) 133/52 (!) 157/70 (!) 141/74  Pulse: 86 86 80 72  Resp: $Remo'18 18 18 20  'LPDQd$ Temp: (!) 101 F (38.3 C) (!) 100.8 F (38.2 C) 99.5 F (37.5 C) 98.9 F (37.2 C)  TempSrc: Oral Oral Oral Oral  SpO2: 97% 96% 95% 99%  Weight:      Height:        Intake/Output Summary (Last 24 hours) at 02/07/2022 1647 Last data filed at 02/07/2022 9563 Gross per 24 hour  Intake 880 ml  Output 700 ml  Net 180 ml   Weight change:  Exam:  General:  Pt is alert, follows commands appropriately, not in acute distress HEENT: No icterus, No thrush, No neck mass, [place]/AT Cardiovascular: RRR, S1/S2, no rubs, no gallops Respiratory: bibasilar crackles. No wheeze Abdomen: Soft/+BS, non tender, non distended, no guarding Extremities: No edema, No lymphangitis, No petechiae, No rashes, no synovitis   Data Reviewed: I have personally reviewed following labs and imaging studies Basic Metabolic Panel: Recent Labs  Lab 02/03/22 2022 02/04/22 0334 02/05/22 0543 02/06/22 0543  NA 141 141 142 140  K 4.1 4.3 3.5 3.7  CL 103 110 108 108  CO2 $Re'23 24 25 25  'vgV$ GLUCOSE 121* 159* 141* 149*  BUN 22 26* 16 9  CREATININE 2.11* 1.69* 0.88 0.76  CALCIUM 8.8* 7.8* 7.8* 8.1*  MG  --  1.5* 1.5* 1.8  PHOS  --  3.9  --   --    Liver Function Tests: Recent Labs  Lab 02/03/22 2022 02/04/22 0334 02/05/22 0543 02/06/22 0543  AST 372* 219* 66* 29  ALT 301*  223* 116* 75*  ALKPHOS 157* 124 99 88  BILITOT 3.0* 1.9* 0.9 0.6  PROT 7.7 6.3* 5.5* 5.7*  ALBUMIN 3.4* 2.7* 2.1* 2.0*   No results for input(s): LIPASE, AMYLASE in the last 168 hours. No results for input(s): AMMONIA in the last 168 hours. Coagulation Profile: Recent Labs  Lab  02/03/22 2022  INR 1.6*   CBC: Recent Labs  Lab 02/03/22 2022 02/04/22 0334 02/05/22 0543 02/06/22 0543  WBC 22.3* 20.3* 8.9 6.6  NEUTROABS 19.6*  --   --   --   HGB 12.8 10.8* 10.5* 10.0*  HCT 40.8 35.1* 33.3* 32.8*  MCV 102.8* 104.5* 102.1* 99.7  PLT 145* 114* 85* 97*   Cardiac Enzymes: No results for input(s): CKTOTAL, CKMB, CKMBINDEX, TROPONINI in the last 168 hours. BNP: Invalid input(s): POCBNP CBG: Recent Labs  Lab 02/06/22 0734 02/06/22 1712 02/06/22 2112 02/07/22 0822 02/07/22 1212  GLUCAP 140* 203* 158* 145* 189*   HbA1C: Recent Labs    02/05/22 0544  HGBA1C 6.6*   Urine analysis:    Component Value Date/Time   COLORURINE AMBER (A) 02/04/2022 0310   APPEARANCEUR HAZY (A) 02/04/2022 0310   LABSPEC 1.020 02/04/2022 0310   PHURINE 5.0 02/04/2022 0310   GLUCOSEU 50 (A) 02/04/2022 0310   HGBUR SMALL (A) 02/04/2022 0310   BILIRUBINUR NEGATIVE 02/04/2022 0310   KETONESUR NEGATIVE 02/04/2022 0310   PROTEINUR 100 (A) 02/04/2022 0310   UROBILINOGEN 0.2 06/18/2015 2300   NITRITE NEGATIVE 02/04/2022 0310   LEUKOCYTESUR NEGATIVE 02/04/2022 0310   Sepsis Labs: $RemoveBefo'@LABRCNTIP'nDWbRFwdlej$ (procalcitonin:4,lacticidven:4) ) Recent Results (from the past 240 hour(s))  Culture, blood (Routine x 2)     Status: Abnormal   Collection Time: 02/03/22  8:22 PM   Specimen: BLOOD RIGHT HAND  Result Value Ref Range Status   Specimen Description   Final    BLOOD RIGHT HAND Performed at [facility], [address], [place], [place] [address]    Special Requests   Final    BOTTLES DRAWN AEROBIC AND ANAEROBIC Blood Culture results may not be optimal due to an inadequate volume of blood received in culture bottles Performed at [facility], [address] [place], [place] [address]    Culture  Setup Time   Final    GRAM NEGATIVE RODS IN BOTH AEROBIC AND ANAEROBIC BOTTLES Gram Stain Report Called to,Read Back By and Verified With: LOOMIS @ 0909 ON [id number] BY HENDERSON L CRITICAL RESULT  CALLED TO, READ BACK BY AND VERIFIED WITH: C,MCGIVVONY RN $RemoveBefore'@1522'[id number]$  02/04/22 EB Performed at [facility], [place] [address] [place], [place] [address]    Culture ESCHERICHIA COLI (A)  Final   Report Status 02/06/2022 FINAL  Final   Organism ID, Bacteria ESCHERICHIA COLI  Final      Susceptibility   Escherichia coli - MIC*    AMPICILLIN <=2 SENSITIVE Sensitive     CEFAZOLIN <=4 SENSITIVE Sensitive     CEFEPIME <=0.12 SENSITIVE Sensitive     CEFTAZIDIME <=1 SENSITIVE Sensitive     CEFTRIAXONE <=0.25 SENSITIVE Sensitive     CIPROFLOXACIN <=0.25 SENSITIVE Sensitive     GENTAMICIN <=1 SENSITIVE Sensitive     IMIPENEM <=0.25 SENSITIVE Sensitive     TRIMETH/SULFA <=20 SENSITIVE Sensitive     AMPICILLIN/SULBACTAM <=2 SENSITIVE Sensitive     PIP/TAZO <=4 SENSITIVE Sensitive     * ESCHERICHIA COLI  Culture, blood (Routine x 2)     Status: Abnormal   Collection Time: 02/03/22  8:22 PM   Specimen:  Left Antecubital; Blood  Result Value Ref Range Status   Specimen Description   Final    LEFT ANTECUBITAL Performed at [facility], [address] [place], [place] [address]    Special Requests   Final    BOTTLES DRAWN AEROBIC AND ANAEROBIC Blood Culture adequate volume Performed at [facility], [address], [place], [place] [address]    Culture  Setup Time   Final    GRAM NEGATIVE RODS IN BOTH AEROBIC AND ANAEROBIC BOTTLES Gram Stain Report Called to,Read Back By and Verified With: LOOMIS @ 8768 ON [id number] BY HENDERSON L Performed at [facility], [address], [place], [place] [address]    Culture (A)  Final    ESCHERICHIA COLI SUSCEPTIBILITIES PERFORMED ON PREVIOUS CULTURE WITHIN THE LAST 5 DAYS. Performed at [facility], [place] [address] [place], [place] [address]    Report Status 02/06/2022 FINAL  Final  Blood Culture ID Panel (Reflexed)     Status: Abnormal   Collection Time: 02/03/22  8:22 PM  Result Value Ref Range Status   Enterococcus faecalis NOT DETECTED NOT DETECTED  Final   Enterococcus Faecium NOT DETECTED NOT DETECTED Final   Listeria monocytogenes NOT DETECTED NOT DETECTED Final   Staphylococcus species NOT DETECTED NOT DETECTED Final   Staphylococcus aureus (BCID) NOT DETECTED NOT DETECTED Final   Staphylococcus epidermidis NOT DETECTED NOT DETECTED Final   Staphylococcus lugdunensis NOT DETECTED NOT DETECTED Final   Streptococcus species NOT DETECTED NOT DETECTED Final   Streptococcus agalactiae NOT DETECTED NOT DETECTED Final   Streptococcus pneumoniae NOT DETECTED NOT DETECTED Final   Streptococcus pyogenes NOT DETECTED NOT DETECTED Final   A.calcoaceticus-baumannii NOT DETECTED NOT DETECTED Final   Bacteroides fragilis NOT DETECTED NOT DETECTED Final   Enterobacterales DETECTED (A) NOT DETECTED Final    Comment: Enterobacterales represent a large order of gram negative bacteria, not a single organism. CRITICAL RESULT CALLED TO, READ BACK BY AND VERIFIED WITH: C,MCGIVVONY RN $RemoveBefore'@1522'[id number]$  02/04/22 EB    Enterobacter cloacae complex NOT DETECTED NOT DETECTED Final   Escherichia coli DETECTED (A) NOT DETECTED Final    Comment: CRITICAL RESULT CALLED TO, READ BACK BY AND VERIFIED WITH: C,MCGIVVONY RN $RemoveBefore'@1522'[email or web address]$  02/04/22 EB    Klebsiella aerogenes NOT DETECTED NOT DETECTED Final   Klebsiella oxytoca NOT DETECTED NOT DETECTED Final   Klebsiella pneumoniae NOT DETECTED NOT DETECTED Final   Proteus species NOT DETECTED NOT DETECTED Final   Salmonella species NOT DETECTED NOT DETECTED Final   Serratia marcescens NOT DETECTED NOT DETECTED Final   Haemophilus influenzae NOT DETECTED NOT DETECTED Final   Neisseria meningitidis NOT DETECTED NOT DETECTED Final   Pseudomonas aeruginosa NOT DETECTED NOT DETECTED Final   Stenotrophomonas maltophilia NOT DETECTED NOT DETECTED Final   Candida albicans NOT DETECTED NOT DETECTED Final   Candida auris NOT DETECTED NOT DETECTED Final   Candida glabrata NOT DETECTED NOT DETECTED Final   Candida krusei NOT DETECTED NOT  DETECTED Final   Candida parapsilosis NOT DETECTED NOT DETECTED Final   Candida tropicalis NOT DETECTED NOT DETECTED Final   Cryptococcus neoformans/gattii NOT DETECTED NOT DETECTED Final   CTX-M ESBL NOT DETECTED NOT DETECTED Final   Carbapenem resistance IMP NOT DETECTED NOT DETECTED Final   Carbapenem resistance KPC NOT DETECTED NOT DETECTED Final   Carbapenem resistance NDM NOT DETECTED NOT DETECTED Final   Carbapenem resist OXA 48 LIKE NOT DETECTED NOT DETECTED Final   Carbapenem resistance VIM NOT DETECTED NOT DETECTED Final  Comment: Performed at [facility], [address] [place], [place] [address]  Resp Panel by RT-PCR (Flu A&B, Covid) Nasopharyngeal Swab     Status: None   Collection Time: 02/03/22  9:31 PM   Specimen: Nasopharyngeal Swab; Nasopharyngeal(NP) swabs in vial transport medium  Result Value Ref Range Status   SARS Coronavirus 2 by RT PCR NEGATIVE NEGATIVE Final    Comment: (NOTE) SARS-CoV-2 target nucleic acids are NOT DETECTED.  The SARS-CoV-2 RNA is generally detectable in upper respiratory specimens during the acute phase of infection. The lowest concentration of SARS-CoV-2 viral copies this assay can detect is 138 copies/mL. A negative result does not preclude SARS-Cov-2 infection and should not be used as the sole basis for treatment or other patient management decisions. A negative result may occur with  improper specimen collection/handling, submission of specimen other than nasopharyngeal swab, presence of viral mutation(s) within the areas targeted by this assay, and inadequate number of viral copies(<138 copies/mL). A negative result must be combined with clinical observations, patient history, and epidemiological information. The expected result is Negative.  Fact Sheet for Patients:  BloggerCourse.com  Fact Sheet for Healthcare Providers:  SeriousBroker.it  This test is no t yet  approved or cleared by the [place] FDA and  has been authorized for detection and/or diagnosis of SARS-CoV-2 by FDA under an Emergency Use Authorization (EUA). This EUA will remain  in effect (meaning this test can be used) for the duration of the COVID-19 declaration under Section 564(b)(1) of the Act, 21 U.S.C.section 360bbb-3(b)(1), unless the authorization is terminated  or revoked sooner.       Influenza A by PCR NEGATIVE NEGATIVE Final   Influenza B by PCR NEGATIVE NEGATIVE Final    Comment: (NOTE) The Xpert Xpress SARS-CoV-2/FLU/RSV plus assay is intended as an aid in the diagnosis of influenza from Nasopharyngeal swab specimens and should not be used as a sole basis for treatment. Nasal washings and aspirates are unacceptable for Xpert Xpress SARS-CoV-2/FLU/RSV testing.  Fact Sheet for Patients: BloggerCourse.com  Fact Sheet for Healthcare Providers: SeriousBroker.it  This test is not yet approved or cleared by the [place] FDA and has been authorized for detection and/or diagnosis of SARS-CoV-2 by FDA under an Emergency Use Authorization (EUA). This EUA will remain in effect (meaning this test can be used) for the duration of the COVID-19 declaration under Section 564(b)(1) of the Act, 21 U.S.C. section 360bbb-3(b)(1), unless the authorization is terminated or revoked.  Performed at [facility], [address], [place], [place] [address]   MRSA Next Gen by PCR, Nasal     Status: None   Collection Time: 02/04/22  2:58 AM  Result Value Ref Range Status   MRSA by PCR Next Gen NOT DETECTED NOT DETECTED Final    Comment: (NOTE) The GeneXpert MRSA Assay (FDA approved for NASAL specimens only), is one component of a comprehensive MRSA colonization surveillance program. It is not intended to diagnose MRSA infection nor to guide or monitor treatment for MRSA infections. Test performance is not FDA approved in  patients less than 13 years old. Performed at [facility], [address], [place], [place] [address]   Urine Culture     Status: None   Collection Time: 02/04/22  3:10 AM   Specimen: Urine, Clean Catch  Result Value Ref Range Status   Specimen Description   Final    URINE, CLEAN CATCH Performed at [facility], [address], [place], [place] [address]  Special Requests   Final    NONE Performed at [facility], [address], [place], [place] [address]    Culture   Final    NO GROWTH Performed at [facility], [place] [address], [place], [place] [address]    Report Status 02/05/2022 FINAL  Final  C Difficile Quick Screen w PCR reflex     Status: Abnormal   Collection Time: 02/04/22  4:41 PM   Specimen: STOOL  Result Value Ref Range Status   C Diff antigen POSITIVE (A) NEGATIVE Final   C Diff toxin NEGATIVE NEGATIVE Final   C Diff interpretation Results are indeterminate. See PCR results.  Final    Comment: Performed at [facility], [address], [place], [place] [address]  Gastrointestinal Panel by PCR , Stool     Status: Abnormal   Collection Time: 02/04/22  4:41 PM   Specimen: STOOL  Result Value Ref Range Status   Campylobacter species NOT DETECTED NOT DETECTED Final   Plesimonas shigelloides NOT DETECTED NOT DETECTED Final   Salmonella species NOT DETECTED NOT DETECTED Final   Yersinia enterocolitica NOT DETECTED NOT DETECTED Final   Vibrio species NOT DETECTED NOT DETECTED Final   Vibrio cholerae NOT DETECTED NOT DETECTED Final   Enteroaggregative E coli (EAEC) NOT DETECTED NOT DETECTED Final   Enteropathogenic E coli (EPEC) NOT DETECTED NOT DETECTED Final   Enterotoxigenic E coli (ETEC) NOT DETECTED NOT DETECTED Final   Shiga like toxin producing E coli (STEC) NOT DETECTED NOT DETECTED Final   Shigella/Enteroinvasive E coli (EIEC) NOT DETECTED NOT DETECTED Final   Cryptosporidium NOT DETECTED NOT DETECTED Final   Cyclospora cayetanensis NOT DETECTED NOT  DETECTED Final   Entamoeba histolytica NOT DETECTED NOT DETECTED Final   Giardia lamblia NOT DETECTED NOT DETECTED Final   Adenovirus F40/41 NOT DETECTED NOT DETECTED Final   Astrovirus NOT DETECTED NOT DETECTED Final   Norovirus GI/GII DETECTED (A) NOT DETECTED Final    Comment: RESULT CALLED TO, READ BACK BY AND VERIFIED WITH: PHOEBE BROWN $RemoveBefo'@2109'[id number]$  ON 02/05/22 SKL    Rotavirus A NOT DETECTED NOT DETECTED Final   Sapovirus (I, II, IV, and V) NOT DETECTED NOT DETECTED Final    Comment: Performed at [facility], [place] [place], [place] [address]  C. Diff by PCR, Reflexed     Status: Abnormal   Collection Time: 02/04/22  4:41 PM  Result Value Ref Range Status   Toxigenic C. Difficile by PCR POSITIVE (A) NEGATIVE Final    Comment: Positive for toxigenic C. difficile with little to no toxin production. Only treat if clinical presentation suggests symptomatic illness. Performed at [facility], [place] [address] [place], [place] [address]   Culture, blood (routine x 2)     Status: None (Preliminary result)   Collection Time: 02/05/22  3:05 PM   Specimen: BLOOD RIGHT HAND  Result Value Ref Range Status   Specimen Description   Final    BLOOD RIGHT HAND BOTTLES DRAWN AEROBIC AND ANAEROBIC   Special Requests   Final    Blood Culture results may not be optimal due to an excessive volume of blood received in culture bottles   Culture   Final    NO GROWTH 2 DAYS Performed at [facility], [address], [place], [place] [address]    Report Status PENDING  Incomplete  Culture, blood (routine x 2)     Status: None (Preliminary result)   Collection Time: 02/05/22  3:19 PM   Specimen: BLOOD RIGHT ARM  Result Value Ref Range Status   Specimen Description   Final    BLOOD RIGHT ARM BOTTLES DRAWN AEROBIC AND ANAEROBIC   Special Requests   Final    Blood Culture results may not be optimal due to an excessive volume of blood received in culture bottles   Culture   Final    NO  GROWTH 2 DAYS Performed at [facility], [address] [place], [place] [address]    Report Status PENDING  Incomplete     Scheduled Meds:  apixaban  5 mg Oral BID   Chlorhexidine Gluconate Cloth  6 each Topical Q0600   furosemide  40 mg Oral Daily   insulin aspart  0-5 Units Subcutaneous QHS   insulin aspart  0-9 Units Subcutaneous TID WC   metoprolol succinate  50 mg Oral Daily   vitamin B-12  500 mcg Oral Daily   Continuous Infusions:  cefTRIAXone (ROCEPHIN)  IV 2 g (02/07/22 1552)    Procedures/Studies: CT ABDOMEN PELVIS WO CONTRAST  Result Date: 02/03/2022 CLINICAL DATA:  Abdominal pain, acute, nonlocalized. Emesis and diarrhea beginning last night. Generalized weakness. Gallbladder drain in place. EXAM: CT ABDOMEN AND PELVIS WITHOUT CONTRAST TECHNIQUE: Multidetector CT imaging of the abdomen and pelvis was performed following the standard protocol without IV contrast. RADIATION DOSE REDUCTION: This exam was performed according to the departmental dose-optimization program which includes automated exposure control, adjustment of the mA and/or kV according to patient size and/or use of iterative reconstruction technique. COMPARISON:  10/30/2021 FINDINGS: Lower chest: Mild atelectasis and or scarring at the lung bases as seen previously. Cardiomegaly. Pacemaker in place. Hepatobiliary: Liver cysts seen in the right lobe, unchanged. Percutaneous drain in the gallbladder fossa without evidence of fluid collection or ongoing inflammatory change. Is this drain being actively managed? Pancreas: Normal Spleen: Normal Adrenals/Urinary Tract: Adrenal glands are normal. Kidneys are normal. Bladder is normal. Stomach/Bowel: Stomach and small intestine are normal. Normal appendix. Ordinary diverticulosis of the left colon without evidence of diverticulitis. Vascular/Lymphatic: Aortic atherosclerosis. No aneurysm. IVC is normal. No adenopathy. Reproductive: No pelvic mass. Other: No free fluid or air.  Musculoskeletal: Ordinary spinal curvature and degenerative change. IMPRESSION: Drain remains in place in the gallbladder fossa region. No evidence of abscess or residual inflammation in that region. Is this catheter being actively managed? Aortic Atherosclerosis (ICD10-I70.0). No other significant or acute finding. Electronically Signed   By: Nelson Chimes M.D.   On: 02/03/2022 22:25   DG Chest 2 View  Result Date: 02/03/2022 CLINICAL DATA:  Suspected sepsis.  Vomiting, diarrhea, weakness EXAM: CHEST - 2 VIEW COMPARISON:  08/22/2021 FINDINGS: Cardiac pacemaker. Heart size and pulmonary vascularity are normal. Lungs are clear. No pleural effusions. No pneumothorax. Mediastinal contours appear intact. Calcification of the aorta. Degenerative changes in the spine and shoulders. Pigtail drainage catheter in the right upper quadrant. IMPRESSION: No active cardiopulmonary disease. Electronically Signed   By: Lucienne Capers M.D.   On: 02/03/2022 19:59   US Abdomen Limited  Result Date: 02/04/2022 CLINICAL DATA:  Sepsis.  Percutaneous cholecystostomy 11/04/2021. EXAM: ULTRASOUND ABDOMEN LIMITED RIGHT UPPER QUADRANT COMPARISON:  CT of 1 day prior FINDINGS: Gallbladder: Presumably decompressed by cholecystostomy catheter. Common bile duct: Diameter: Upper normal for age, 8 mm. Liver: Poorly visualized. No gross abnormality identified. Portal vein is patent on color Doppler imaging with normal direction of blood flow towards the liver. Other: Generally limited exam secondary to overlying bowel gas and bandaging. IMPRESSION:  Limitations as detailed above. Gallbladder not well visualized, presumably decompressed by cholecystostomy catheter as on yesterday's CT. Electronically Signed   By: Abigail Miyamoto M.D.   On: 02/04/2022 13:07    Orson Eva, DO  [facility]  If 7PM-7AM, please contact night-coverage [email or web address] Password [facility] 02/07/2022, 4:47 PM   LOS: 3 days

## 2022-02-07 NOTE — Progress Notes (Signed)
Spoke to patients daughter about possible discharge today. Daughter [name]) is concerned that patient is very weak and will not be able to take care of herself at home. [name] would like MD or Case Management to call her sometime today. PT consult ordered.

## 2022-02-07 NOTE — Plan of Care (Signed)
°  Problem: Fluid Volume: Goal: Hemodynamic stability will improve Outcome: Progressing   Problem: Health Behavior/Discharge Planning: Goal: Ability to manage health-related needs will improve Outcome: Progressing

## 2022-02-07 NOTE — Evaluation (Signed)
Physical Therapy Evaluation Patient Details Name: Natalie Castro MRN: [id number] DOB: Oct 29, 1941 Today's Date: 02/07/2022  History of Present Illness  Natalie Castro is a 81 y.o. female with medical history significant of hypertension, hyperlipidemia, Complete heart Block Status Post pacemaker placement, history of PE on Eliquis, chronic cholecystitis who presents to the emergency department due to generalized weakness.  Patient was unable to provide a history, history was obtained from ED physician and daughter at bedside.  Per report, daughter states that patient started to have diarrhea 2 nights ago with associated nonbloody vomiting and subsequent weakness.  She has a cholecystotomy drain placed about 6 to 7 months ago.   Clinical Impression  Patient demonstrates slow labored movement for sitting up at bedside with fair/good return for propping up on elbows to hands during supine to sitting, slow labored unsteady cadence without loss of balance and limited mostly due to c/o fatigue and generalized weakness.  Patient tolerated sitting up in chair after therapy with her daughter present in room.  Patient will benefit from continued skilled physical therapy in hospital and recommended venue below to increase strength, balance, endurance for safe ADLs and gait.          Recommendations for follow up therapy are one component of a multi-disciplinary discharge planning process, led by the attending physician.  Recommendations may be updated based on patient status, additional functional criteria and insurance authorization.  Follow Up Recommendations Skilled nursing-short term rehab (<3 hours/day)    Assistance Recommended at Discharge Set up Supervision/Assistance  Patient can return home with the following  A little help with walking and/or transfers;A little help with bathing/dressing/bathroom;A lot of help with walking and/or transfers;Help with stairs or ramp for entrance    Equipment  Recommendations None recommended by PT  Recommendations for Other Services       Functional Status Assessment Patient has had a recent decline in their functional status and demonstrates the ability to make significant improvements in function in a reasonable and predictable amount of time.     Precautions / Restrictions Precautions Precautions: Fall Restrictions Weight Bearing Restrictions: No      Mobility  Bed Mobility Overal bed mobility: Needs Assistance Bed Mobility: Supine to Sit     Supine to sit: Min assist, Mod assist     General bed mobility comments: slow labored movement with fair/good return for propping up on elbows to hands during supine to sitting    Transfers Overall transfer level: Needs assistance Equipment used: Rolling walker (2 wheels) Transfers: Sit to/from Stand, Bed to chair/wheelchair/BSC Sit to Stand: Min assist   Step pivot transfers: Min assist       General transfer comment: increased time, labored movement    Ambulation/Gait Ambulation/Gait assistance: Min assist, Mod assist Gait Distance (Feet): 30 Feet Assistive device: Rolling walker (2 wheels) Gait Pattern/deviations: Decreased step length - left, Decreased stance time - right, Decreased stride length Gait velocity: decreased     General Gait Details: slow labored unsteady cadence without loss of balance and limited mostly due to c/o fatigue/generalized weakness  Stairs            Wheelchair Mobility    Modified Rankin (Stroke Patients Only)       Balance Overall balance assessment: Needs assistance Sitting-balance support: Feet supported, No upper extremity supported Sitting balance-Leahy Scale: Good Sitting balance - Comments: seated at EOB   Standing balance support: During functional activity, Bilateral upper extremity supported Standing balance-Leahy Scale: Fair Standing balance comment:  using RW                             Pertinent  Vitals/Pain Pain Assessment Pain Assessment: No/denies pain    Home Living Family/patient expects to be discharged to:: Private residence Living Arrangements: Children Available Help at Discharge: Family;Available PRN/intermittently Type of Home: House Home Access: Stairs to enter Entrance Stairs-Rails: Right Entrance Stairs-Number of Steps: 2-3   Home Layout: One level;Laundry or work area in basement;Able to live on main level with bedroom/bathroom Home Equipment: Conservation officer, nature (2 wheels);Rollator (4 wheels);Cane - single point;BSC/3in1;Shower seat;Wheelchair - manual      Prior Function Prior Level of Function : Needs assist       Physical Assist : Mobility (physical);ADLs (physical) Mobility (physical): Bed mobility;Transfers;Gait;Stairs   Mobility Comments: Household and short distanced Chief Financial Officer or RW, seldom drives ADLs Comments: assisted by family     Hand Dominance   Dominant Hand: Right    Extremity/Trunk Assessment   Upper Extremity Assessment Upper Extremity Assessment: Generalized weakness    Lower Extremity Assessment Lower Extremity Assessment: Generalized weakness    Cervical / Trunk Assessment Cervical / Trunk Assessment: Kyphotic  Communication   Communication: No difficulties  Cognition Arousal/Alertness: Awake/alert Behavior During Therapy: WFL for tasks assessed/performed Overall Cognitive Status: Within Functional Limits for tasks assessed                                          General Comments      Exercises     Assessment/Plan    PT Assessment Patient needs continued PT services  PT Problem List Decreased strength;Decreased activity tolerance;Decreased balance;Decreased mobility       PT Treatment Interventions DME instruction;Stair training;Functional mobility training;Therapeutic activities;Therapeutic exercise    PT Goals (Current goals can be found in the Care Plan section)   Acute Rehab PT Goals Patient Stated Goal: return home after rehab PT Goal Formulation: With patient/family Time For Goal Achievement: 02/21/22 Potential to Achieve Goals: Good    Frequency Min 3X/week     Co-evaluation               AM-PAC PT "6 Clicks" Mobility  Outcome Measure Help needed turning from your back to your side while in a flat bed without using bedrails?: A Little Help needed moving from lying on your back to sitting on the side of a flat bed without using bedrails?: A Lot Help needed moving to and from a bed to a chair (including a wheelchair)?: A Little Help needed standing up from a chair using your arms (e.g., wheelchair or bedside chair)?: A Little Help needed to walk in hospital room?: A Little Help needed climbing 3-5 steps with a railing? : A Lot 6 Click Score: 16    End of Session   Activity Tolerance: Patient tolerated treatment well;Patient limited by fatigue Patient left: in chair;with call bell/phone within reach;with family/visitor present Nurse Communication: Mobility status PT Visit Diagnosis: Unsteadiness on feet (R26.81);Other abnormalities of gait and mobility (R26.89);Muscle weakness (generalized) (M62.81)    Time: 3244-0102 PT Time Calculation (min) (ACUTE ONLY): 32 min   Charges:   PT Evaluation $PT Eval Moderate Complexity: 1 Mod PT Treatments $Therapeutic Activity: 23-37 mins        4:08 PM, 02/07/22 Lonell Grandchild, MPT Physical Therapist with [facility]  [phone] office [phone] mobile phone

## 2022-02-08 DIAGNOSIS — Z6832 Body mass index (BMI) 32.0-32.9, adult: Secondary | ICD-10-CM

## 2022-02-08 DIAGNOSIS — Z86711 Personal history of pulmonary embolism: Secondary | ICD-10-CM

## 2022-02-08 DIAGNOSIS — B9629 Other Escherichia coli [E. coli] as the cause of diseases classified elsewhere: Secondary | ICD-10-CM

## 2022-02-08 DIAGNOSIS — B9620 Unspecified Escherichia coli [E. coli] as the cause of diseases classified elsewhere: Secondary | ICD-10-CM

## 2022-02-08 DIAGNOSIS — Z950 Presence of cardiac pacemaker: Secondary | ICD-10-CM

## 2022-02-08 DIAGNOSIS — R7881 Bacteremia: Secondary | ICD-10-CM

## 2022-02-08 DIAGNOSIS — A0811 Acute gastroenteropathy due to Norwalk agent: Secondary | ICD-10-CM

## 2022-02-08 DIAGNOSIS — A0472 Enterocolitis due to Clostridium difficile, not specified as recurrent: Secondary | ICD-10-CM

## 2022-02-08 DIAGNOSIS — R8281 Pyuria: Secondary | ICD-10-CM

## 2022-02-08 LAB — CBC WITH DIFFERENTIAL/PLATELET
Band Neutrophils: 25 %
Basophils Absolute: 0 10*3/uL (ref 0.0–0.1)
Basophils Relative: 0 %
Eosinophils Absolute: 0 10*3/uL (ref 0.0–0.5)
Eosinophils Relative: 0 %
HCT: 40.8 % (ref 36.0–46.0)
Hemoglobin: 12.8 g/dL (ref 12.0–15.0)
Lymphocytes Relative: 10 %
Lymphs Abs: 2.2 10*3/uL (ref 0.7–4.0)
MCH: 32.2 pg (ref 26.0–34.0)
MCHC: 31.4 g/dL (ref 30.0–36.0)
MCV: 102.8 fL — ABNORMAL HIGH (ref 80.0–100.0)
Monocytes Absolute: 0.4 10*3/uL (ref 0.1–1.0)
Monocytes Relative: 2 %
Neutro Abs: 19.6 10*3/uL — ABNORMAL HIGH (ref 1.7–7.7)
Neutrophils Relative %: 63 %
Platelets: 145 10*3/uL — ABNORMAL LOW (ref 150–400)
RBC: 3.97 MIL/uL (ref 3.87–5.11)
RDW: 14.9 % (ref 11.5–15.5)
WBC Morphology: INCREASED
WBC: 22.3 10*3/uL — ABNORMAL HIGH (ref 4.0–10.5)
nRBC: 0 % (ref 0.0–0.2)

## 2022-02-08 LAB — BASIC METABOLIC PANEL
Anion gap: 9 (ref 5–15)
BUN: 9 mg/dL (ref 8–23)
CO2: 22 mmol/L (ref 22–32)
Calcium: 7.7 mg/dL — ABNORMAL LOW (ref 8.9–10.3)
Chloride: 107 mmol/L (ref 98–111)
Creatinine, Ser: 0.64 mg/dL (ref 0.44–1.00)
GFR, Estimated: >60 mL/min (ref >60)
Glucose, Bld: 182 mg/dL — ABNORMAL HIGH (ref 70–99)
Potassium: 3.3 mmol/L — ABNORMAL LOW (ref 3.5–5.1)
Sodium: 138 mmol/L (ref 135–145)

## 2022-02-08 LAB — CBC
HCT: 29.1 % — ABNORMAL LOW (ref 36.0–46.0)
Hemoglobin: 9.4 g/dL — ABNORMAL LOW (ref 12.0–15.0)
MCH: 32.3 pg (ref 26.0–34.0)
MCHC: 32.3 g/dL (ref 30.0–36.0)
MCV: 100 fL (ref 80.0–100.0)
Platelets: 138 10*3/uL — ABNORMAL LOW (ref 150–400)
RBC: 2.91 MIL/uL — ABNORMAL LOW (ref 3.87–5.11)
RDW: 14.6 % (ref 11.5–15.5)
WBC: 5.5 10*3/uL (ref 4.0–10.5)

## 2022-02-08 LAB — GLUCOSE, CAPILLARY
Glucose-Capillary: 171 mg/dL — ABNORMAL HIGH (ref 70–99)
Glucose-Capillary: 193 mg/dL — ABNORMAL HIGH (ref 70–99)
Glucose-Capillary: 155 mg/dL — ABNORMAL HIGH (ref 70–99)
Glucose-Capillary: 157 mg/dL — ABNORMAL HIGH (ref 70–99)

## 2022-02-08 LAB — MAGNESIUM: Magnesium: 1.4 mg/dL — ABNORMAL LOW (ref 1.7–2.4)

## 2022-02-08 NOTE — Progress Notes (Signed)
PROGRESS NOTE  Natalie Castro AJO:[id number] DOB: 11/13/1941 DOA: 02/03/2022 PCP: [facility]  Brief History:  81 year old female with a history of HFpEF, coronary disease, diabetes mellitus type 2, complete heart block status post permanent pacemaker, hypertension, hyperlipidemia, pulmonary embolus on apixaban, and chronic cholecystitis presenting with 2-day history of abdominal pain and generalized weakness.  Natalie Castro is difficult historian at best.  History is supplemented by Natalie Castro's daughter and review of Natalie medical record.  Apparently, Natalie Castro had a couple episodes of nausea and vomiting on 02/03/2022 with some loose stool.  There is no reports of hematochezia or melena.  Natalie Castro had subjective fevers and chills.  Natalie Castro denies any headache, chest pain, coughing, shortness of breath, hemoptysis.  She states that she has had some dysuria.  As result, Natalie Castro was brought to Natalie emergency department for further evaluation.  Natalie Castro has had generalized weakness for about what she had difficulty getting out of bed.  At baseline, Natalie Castro is able to ambulate with a walker.  Her daughter states that she usually flushes Natalie cholecystotomy tube 2 times per week.  There has not been any difficulty flushing it. Notably, Natalie Castro was admitted to Natalie hospital on 10/30/2021 to 11/05/2021 for recurrent acute acalculous cholecystitis.  A percutaneous cholecystotomy tube was again placed on 11/04/2021.  Notably, Natalie Castro also had a previous cholecystotomy tube placed on 07/08/2021.  It was removed on 09/22/2021 after 1 month capping trial. In Natalie emergency department, Natalie Castro had was hypotensive with a blood pressure 87/53.  BMP showed sodium 141, potassium 4.1, bicarbonate 23, serum creatinine 2.11.  WBC 22.3, hemoglobin 12.8, platelets 1-45,000.  CT of Natalie abdomen and pelvis showed bibasilar scarring and atelectasis.  There was an unchanged  percutaneous drain in Natalie gallbladder fossa without evidence of fluid collection or ongoing inflammation.  Natalie Castro was started on aztreonam and metronidazole in Natalie ED.  Lactic acid peaked 4.1.  AST 372, ALT 301, alk phosphatase 157, total bilirubin 3.0.  She was admitted for further evaluation and treatment of sepsis.  Upon arrival to Natalie medical floor, Natalie Castro was started on cefepime and metronidazole.  General surgery was consulted to assist with management whom recommended continuing nonoperative management as CT abd is re-assuring.     Assessment and Plan: * Severe sepsis ([place])- (present on admission) Present on admission Castro presented with leukocytosis, tachycardia, and hypotension and acute kidney injury Lactic acid 4.1>> 2.3 Secondary to hepatobiliary source and urinary source Initially on cefepime and metronidazole 2/11>>now on merrem pending final culture data>>>de-escalate to ceftriaxone -persistent fever likely due to norovirus>>2 BM in last 24 hours Sepsis physiology resolved 02/08/22--no fevers x >24 hrs  Acute on chronic cholecystitis- (present on admission) Initially on metronidazole and cefepime -now switched to merrem pending final cultures>>de-escalate to ceftriaxone Cholecystotomy tube previously placed 11/04/2021--CT shows proper positiioning Tube is draining appropriately General surgery consult appreciate>>continue nonoperative management 02/03/2022 CT abdomen as discussed above  AKI (acute kidney injury) ([place])- (present on admission) Baseline Cr at 0.7-0.9 Presented with serum creatinine 2.11 Secondary to sepsis and hemodynamic changes Hold losartan Continue IV fluids>>saline locked Overall improved>>serum creatinine 0.64 on 2/14  Pyuria- (present on admission) Concerned about UTI given Natalie Castro's dysuria and pyuria UA 21-50 WBC Follow urine culture--neg but collected after abx started  Thrombocytopenia ([place])- (present on admission) Platelets  145 at Natalie time of admission Secondary to sepsis Anticipate  improvement with treatment of infectious process  Chronic diastolic CHF (congestive heart failure) ([place])- (present on admission) Appears clinically euvolemic Holding furosemide temporarily due to hypotension and sepsis initially -restarted lasix  Enteritis due to Norovirus 2 BMs in last 24 hours Tolerating diet abd pain overall improving  Hypomagnesemia Mag = 1.5 replete  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 Natalie Castro is clinically improved with improve abd pain and normalized WBC Antigen positive with neg toxin>>colonization  B12 deficiency Give 1 dose of IM B12 Start oral supplementation  Class 1 obesity- (present on admission) BMI 32.61 Lifestyle modification  Transaminitis- (present on admission) Due to sepsis and hemodynamic changes Overall improved   Bacteremia, escherichia coli- (present on admission) Stable on ceftriaxone Switch to po cipro po at time of d/c with last day on 02/14/22  Mixed hyperlipidemia- (present on admission) Holding statin secondary to elevated LFTs>>restart after dc as LFTs improved  History of pulmonary embolus (PE)- (present on admission) Initially Holding apixaban temporarily in anticipation for any procedures 02/05/22--restart apixaban  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 [name] in Natalie outpatient setting  Essential hypertension, benign- (present on admission) Holding nifedipine, hydralazine, losartan, metoprolol succinate secondary to hypotension Reintroduce antihypertensive medications as BP improves 02/05/22--restarted metoprolol succinate  Controlled type 2 diabetes mellitus without complication, without long-term current use of insulin (HCC) 10/30/2021 hemoglobin A1c 6.8 NovoLog sliding scale Holding metformin>>restart after d/c       Status is: Inpatient Remains inpatient appropriate  because: Severity of illness with hemodynamic instability and requirement for intravenous antibiotics             Family Communication:   sister and daughter updated 2/14   Consultants:  general surgery   Code Status:  FULL   DVT Prophylaxis:  apixaban     Procedures: As Listed in Progress Note Above   Antibiotics: Cefepime 2/10>>2/11 Metronidazole 2/10>>2/11 Merrem 2/11>>2/12 Ceftriaxone 2/12>>>         Subjective: Castro denies fevers, chills, headache, chest pain, dyspnea, nausea, vomiting, diarrhea, abdominal pain, dysuria, hematuria, hematochezia, and melena.   Objective: Vitals:   02/07/22 2019 02/08/22 0352 02/08/22 1032 02/08/22 1250  BP: (!) 127/58 (!) 147/66 (!) 122/56 (!) 146/52  Pulse: 71 74 68 73  Resp: _0 Temp: 98.8 F (37.1 C) 98.4 F (36.9 C)  98.4 F (36.9 C)  TempSrc: Oral Oral  Oral  SpO2: 100% 97%  97%  Weight:      Height:        Intake/Output Summary (Last 24 hours) at 02/08/2022 1952 Last data filed at 02/08/2022 1850 Gross per 24 hour  Intake 920 ml  Output 500 ml  Net 420 ml   Weight change:  Exam:  General:  Natalie Castro is alert, follows commands appropriately, not in acute distress HEENT: No icterus, No thrush, No neck mass, [place]/AT Cardiovascular: RRR, S1/S2, no rubs, no gallops Respiratory: CTA bilaterally, no wheezing, no crackles, no rhonchi Abdomen: Soft/+BS, non tender, non distended, no guarding Extremities: No edema, No lymphangitis, No petechiae, No rashes, no synovitis   Data Reviewed: I have personally reviewed following labs and imaging studies Basic Metabolic Panel: Recent Labs  Lab 02/03/22 2022 02/04/22 0334 02/05/22 0543 02/06/22 0543 02/08/22 0551  NA 141 141 142 140 138  K 4.1 4.3 3.5 3.7 3.3*  CL 103 110 108 108 107  CO2 _1 GLUCOSE 121* 159* 141* 149* 182*  BUN 22 26* 16 9 9  CREATININE 2.11* 1.69* 0.88 0.76 0.64  CALCIUM 8.8* 7.8* 7.8* 8.1* 7.7*  MG  --  1.5* 1.5* 1.8 1.4*   PHOS  --  3.9  --   --   --    Liver Function Tests: Recent Labs  Lab 02/03/22 2022 02/04/22 0334 02/05/22 0543 02/06/22 0543  AST 372* 219* 66* 29  ALT 301* 223* 116* 75*  ALKPHOS 157* 124 99 88  BILITOT 3.0* 1.9* 0.9 0.6  PROT 7.7 6.3* 5.5* 5.7*  ALBUMIN 3.4* 2.7* 2.1* 2.0*   No results for input(s): LIPASE, AMYLASE in Natalie last 168 hours. No results for input(s): AMMONIA in Natalie last 168 hours. Coagulation Profile: Recent Labs  Lab 02/03/22 2022  INR 1.6*   CBC: Recent Labs  Lab 02/03/22 2022 02/04/22 0334 02/05/22 0543 02/06/22 0543 02/08/22 0551  WBC 22.3* 20.3* 8.9 6.6 5.5  NEUTROABS 19.6*  --   --   --   --   HGB 12.8 10.8* 10.5* 10.0* 9.4*  HCT 40.8 35.1* 33.3* 32.8* 29.1*  MCV 102.8* 104.5* 102.1* 99.7 100.0  PLT 145* 114* 85* 97* 138*   Cardiac Enzymes: No results for input(s): CKTOTAL, CKMB, CKMBINDEX, TROPONINI in Natalie last 168 hours. BNP: Invalid input(s): POCBNP CBG: Recent Labs  Lab 02/07/22 1708 02/07/22 2019 02/08/22 0705 02/08/22 1113 02/08/22 1602  GLUCAP 155* 177* 171* 193* 155*   HbA1C: No results for input(s): HGBA1C in Natalie last 72 hours. Urine analysis:    Component Value Date/Time   COLORURINE AMBER (A) 02/04/2022 0310   APPEARANCEUR HAZY (A) 02/04/2022 0310   LABSPEC 1.020 02/04/2022 0310   PHURINE 5.0 02/04/2022 0310   GLUCOSEU 50 (A) 02/04/2022 0310   HGBUR SMALL (A) 02/04/2022 0310   BILIRUBINUR NEGATIVE 02/04/2022 0310   KETONESUR NEGATIVE 02/04/2022 0310   PROTEINUR 100 (A) 02/04/2022 0310   UROBILINOGEN 0.2 06/18/2015 2300   NITRITE NEGATIVE 02/04/2022 0310   LEUKOCYTESUR NEGATIVE 02/04/2022 0310   Sepsis Labs: _0 (procalcitonin:4,lacticidven:4) ) Recent Results (from Natalie past 240 hour(s))  Culture, blood (Routine x 2)     Status: Abnormal   Collection Time: 02/03/22  8:22 PM   Specimen: BLOOD RIGHT HAND  Result Value Ref Range Status   Specimen Description   Final    BLOOD RIGHT HAND Performed at  [facility], [address] [place], [place] [address]    Special Requests   Final    BOTTLES DRAWN AEROBIC AND ANAEROBIC Blood Culture results may not be optimal due to an inadequate volume of blood received in culture bottles Performed at [facility], [address], [place], [place] [address]    Culture  Setup Time   Final    GRAM NEGATIVE RODS IN BOTH AEROBIC AND ANAEROBIC BOTTLES Gram Stain Report Called to,Read Back By and Verified With: LOOMIS @ 0909 ON [id number] BY HENDERSON L CRITICAL RESULT CALLED TO, READ BACK BY AND VERIFIED WITH: C,MCGIVVONY RN _1  02/04/22 EB Performed at [facility], [place] [address], [place], [place] [address]    Culture ESCHERICHIA COLI (A)  Final   Report Status 02/06/2022 FINAL  Final   Organism ID, Bacteria ESCHERICHIA COLI  Final      Susceptibility   Escherichia coli - MIC*    AMPICILLIN <=2 SENSITIVE Sensitive     CEFAZOLIN <=4 SENSITIVE Sensitive     CEFEPIME <=0.12 SENSITIVE Sensitive     CEFTAZIDIME <=1 SENSITIVE Sensitive     CEFTRIAXONE <=0.25 SENSITIVE Sensitive     CIPROFLOXACIN <=0.25 SENSITIVE  Sensitive     GENTAMICIN <=1 SENSITIVE Sensitive     IMIPENEM <=0.25 SENSITIVE Sensitive     TRIMETH/SULFA <=20 SENSITIVE Sensitive     AMPICILLIN/SULBACTAM <=2 SENSITIVE Sensitive     PIP/TAZO <=4 SENSITIVE Sensitive     * ESCHERICHIA COLI  Culture, blood (Routine x 2)     Status: Abnormal   Collection Time: 02/03/22  8:22 PM   Specimen: Left Antecubital; Blood  Result Value Ref Range Status   Specimen Description   Final    LEFT ANTECUBITAL Performed at [facility], [address], [place], [place] [address]    Special Requests   Final    BOTTLES DRAWN AEROBIC AND ANAEROBIC Blood Culture adequate volume Performed at [facility], [address], [place], [place] [address]    Culture  Setup Time   Final    GRAM NEGATIVE RODS IN BOTH AEROBIC AND ANAEROBIC BOTTLES Gram Stain Report Called to,Read Back By and Verified With: LOOMIS @  0909 ON [id number] BY HENDERSON L Performed at [facility], [address] [place], [place] [address]    Culture (A)  Final    ESCHERICHIA COLI SUSCEPTIBILITIES PERFORMED ON PREVIOUS CULTURE WITHIN Natalie LAST 5 DAYS. Performed at [facility], [place] [address], [place], [place] [address]    Report Status 02/06/2022 FINAL  Final  Blood Culture ID Panel (Reflexed)     Status: Abnormal   Collection Time: 02/03/22  8:22 PM  Result Value Ref Range Status   Enterococcus faecalis NOT DETECTED NOT DETECTED Final   Enterococcus Faecium NOT DETECTED NOT DETECTED Final   Listeria monocytogenes NOT DETECTED NOT DETECTED Final   Staphylococcus species NOT DETECTED NOT DETECTED Final   Staphylococcus aureus (BCID) NOT DETECTED NOT DETECTED Final   Staphylococcus epidermidis NOT DETECTED NOT DETECTED Final   Staphylococcus lugdunensis NOT DETECTED NOT DETECTED Final   Streptococcus species NOT DETECTED NOT DETECTED Final   Streptococcus agalactiae NOT DETECTED NOT DETECTED Final   Streptococcus pneumoniae NOT DETECTED NOT DETECTED Final   Streptococcus pyogenes NOT DETECTED NOT DETECTED Final   A.calcoaceticus-baumannii NOT DETECTED NOT DETECTED Final   Bacteroides fragilis NOT DETECTED NOT DETECTED Final   Enterobacterales DETECTED (A) NOT DETECTED Final    Comment: Enterobacterales represent a large order of gram negative bacteria, not a single organism. CRITICAL RESULT CALLED TO, READ BACK BY AND VERIFIED WITH: C,MCGIVVONY RN _0  02/04/22 EB    Enterobacter cloacae complex NOT DETECTED NOT DETECTED Final   Escherichia coli DETECTED (A) NOT DETECTED Final    Comment: CRITICAL RESULT CALLED TO, READ BACK BY AND VERIFIED WITH: C,MCGIVVONY RN _1  02/04/22 EB    Klebsiella aerogenes NOT DETECTED NOT DETECTED Final   Klebsiella oxytoca NOT DETECTED NOT DETECTED Final   Klebsiella pneumoniae NOT DETECTED NOT DETECTED Final   Proteus species NOT DETECTED NOT DETECTED Final   Salmonella species  NOT DETECTED NOT DETECTED Final   Serratia marcescens NOT DETECTED NOT DETECTED Final   Haemophilus influenzae NOT DETECTED NOT DETECTED Final   Neisseria meningitidis NOT DETECTED NOT DETECTED Final   Pseudomonas aeruginosa NOT DETECTED NOT DETECTED Final   Stenotrophomonas maltophilia NOT DETECTED NOT DETECTED Final   Candida albicans NOT DETECTED NOT DETECTED Final   Candida auris NOT DETECTED NOT DETECTED Final   Candida glabrata NOT DETECTED NOT DETECTED Final   Candida krusei NOT DETECTED NOT DETECTED Final   Candida parapsilosis NOT DETECTED NOT DETECTED Final   Candida tropicalis NOT DETECTED NOT DETECTED Final  Cryptococcus neoformans/gattii NOT DETECTED NOT DETECTED Final   CTX-M ESBL NOT DETECTED NOT DETECTED Final   Carbapenem resistance IMP NOT DETECTED NOT DETECTED Final   Carbapenem resistance KPC NOT DETECTED NOT DETECTED Final   Carbapenem resistance NDM NOT DETECTED NOT DETECTED Final   Carbapenem resist OXA 48 LIKE NOT DETECTED NOT DETECTED Final   Carbapenem resistance VIM NOT DETECTED NOT DETECTED Final    Comment: Performed at [facility], [place] [address] [place], [place] [address]  Resp Panel by RT-PCR (Flu A&B, Covid) Nasopharyngeal Swab     Status: None   Collection Time: 02/03/22  9:31 PM   Specimen: Nasopharyngeal Swab; Nasopharyngeal(NP) swabs in vial transport medium  Result Value Ref Range Status   SARS Coronavirus 2 by RT PCR NEGATIVE NEGATIVE Final    Comment: (NOTE) SARS-CoV-2 target nucleic acids are NOT DETECTED.  Natalie SARS-CoV-2 RNA is generally detectable in upper respiratory specimens during Natalie acute phase of infection. Natalie lowest concentration of SARS-CoV-2 viral copies this assay can detect is 138 copies/mL. A negative result does not preclude SARS-Cov-2 infection and should not be used as Natalie sole basis for treatment or other Castro management decisions. A negative result may occur with  improper specimen collection/handling,  submission of specimen other than nasopharyngeal swab, presence of viral mutation(s) within Natalie areas targeted by this assay, and inadequate number of viral copies(<138 copies/mL). A negative result must be combined with clinical observations, Castro history, and epidemiological information. Natalie expected result is Negative.  Fact Sheet for Patients:  EntrepreneurPulse.com.au  Fact Sheet for Healthcare Providers:  IncredibleEmployment.be  This test is no t yet approved or cleared by [place] FDA and  has been authorized for detection and/or diagnosis of SARS-CoV-2 by FDA under an Emergency Use Authorization (EUA). This EUA will remain  in effect (meaning this test can be used) for Natalie duration of Natalie COVID-19 declaration under Section 564(b)(1) of Natalie Act, 21 U.S.C.section 360bbb-3(b)(1), unless Natalie authorization is terminated  or revoked sooner.       Influenza A by PCR NEGATIVE NEGATIVE Final   Influenza B by PCR NEGATIVE NEGATIVE Final    Comment: (NOTE) Natalie Xpert Xpress SARS-CoV-2/FLU/RSV plus assay is intended as an aid in Natalie diagnosis of influenza from Nasopharyngeal swab specimens and should not be used as a sole basis for treatment. Nasal washings and aspirates are unacceptable for Xpert Xpress SARS-CoV-2/FLU/RSV testing.  Fact Sheet for Patients: EntrepreneurPulse.com.au  Fact Sheet for Healthcare Providers: IncredibleEmployment.be  This test is not yet approved or cleared by [place] FDA and has been authorized for detection and/or diagnosis of SARS-CoV-2 by FDA under an Emergency Use Authorization (EUA). This EUA will remain in effect (meaning this test can be used) for Natalie duration of Natalie COVID-19 declaration under Section 564(b)(1) of Natalie Act, 21 U.S.C. section 360bbb-3(b)(1), unless Natalie authorization is terminated or revoked.  Performed at [facility], [address], [place], [place] [address]   MRSA Next Gen by PCR, Nasal     Status: None   Collection Time: 02/04/22  2:58 AM  Result Value Ref Range Status   MRSA by PCR Next Gen NOT DETECTED NOT DETECTED Final    Comment: (NOTE) Natalie GeneXpert MRSA Assay (FDA approved for NASAL specimens only), is one component of a comprehensive MRSA colonization surveillance program. It is not intended to diagnose MRSA infection nor to guide or monitor treatment for MRSA infections. Test performance is not FDA approved in patients less  than 21 years old. Performed at [facility], [address], [place], [place] [address]   Urine Culture     Status: None   Collection Time: 02/04/22  3:10 AM   Specimen: Urine, Clean Catch  Result Value Ref Range Status   Specimen Description   Final    URINE, CLEAN CATCH Performed at [facility], [address], [place], [place] [address]    Special Requests   Final    NONE Performed at [facility], [address], [place], [place] [address]    Culture   Final    NO GROWTH Performed at [facility], [place] [address] [place], [place] [address]    Report Status 02/05/2022 FINAL  Final  C Difficile Quick Screen w PCR reflex     Status: Abnormal   Collection Time: 02/04/22  4:41 PM   Specimen: STOOL  Result Value Ref Range Status   C Diff antigen POSITIVE (A) NEGATIVE Final   C Diff toxin NEGATIVE NEGATIVE Final   C Diff interpretation Results are indeterminate. See PCR results.  Final    Comment: Performed at [facility] [address] [place], [place] [address]  Gastrointestinal Panel by PCR , Stool     Status: Abnormal   Collection Time: 02/04/22  4:41 PM   Specimen: STOOL  Result Value Ref Range Status   Campylobacter species NOT DETECTED NOT DETECTED Final   Plesimonas shigelloides NOT DETECTED NOT DETECTED Final   Salmonella species NOT DETECTED NOT DETECTED Final   Yersinia enterocolitica NOT DETECTED NOT DETECTED Final   Vibrio species NOT DETECTED NOT  DETECTED Final   Vibrio cholerae NOT DETECTED NOT DETECTED Final   Enteroaggregative E coli (EAEC) NOT DETECTED NOT DETECTED Final   Enteropathogenic E coli (EPEC) NOT DETECTED NOT DETECTED Final   Enterotoxigenic E coli (ETEC) NOT DETECTED NOT DETECTED Final   Shiga like toxin producing E coli (STEC) NOT DETECTED NOT DETECTED Final   Shigella/Enteroinvasive E coli (EIEC) NOT DETECTED NOT DETECTED Final   Cryptosporidium NOT DETECTED NOT DETECTED Final   Cyclospora cayetanensis NOT DETECTED NOT DETECTED Final   Entamoeba histolytica NOT DETECTED NOT DETECTED Final   Giardia lamblia NOT DETECTED NOT DETECTED Final   Adenovirus F40/41 NOT DETECTED NOT DETECTED Final   Astrovirus NOT DETECTED NOT DETECTED Final   Norovirus GI/GII DETECTED (A) NOT DETECTED Final    Comment: RESULT CALLED TO, READ BACK BY AND VERIFIED WITH: PHOEBE BROWN _0  ON 02/05/22 SKL    Rotavirus A NOT DETECTED NOT DETECTED Final   Sapovirus (I, II, IV, and V) NOT DETECTED NOT DETECTED Final    Comment: Performed at [facility], [place] [place], [place] [address]  C. Diff by PCR, Reflexed     Status: Abnormal   Collection Time: 02/04/22  4:41 PM  Result Value Ref Range Status   Toxigenic C. Difficile by PCR POSITIVE (A) NEGATIVE Final    Comment: Positive for toxigenic C. difficile with little to no toxin production. Only treat if clinical presentation suggests symptomatic illness. Performed at [facility], [place] [address] [place], [place] [address]   Culture, blood (routine x 2)     Status: None (Preliminary result)   Collection Time: 02/05/22  3:05 PM   Specimen: BLOOD RIGHT HAND  Result Value Ref Range Status   Specimen Description   Final    BLOOD RIGHT HAND BOTTLES DRAWN AEROBIC AND ANAEROBIC Performed at [facility], [address],  [place], [place] [address]    Special Requests   Final    Blood Culture results may not be optimal due to an excessive volume of blood received in  culture bottles Performed at [facility], [address] [place], [place] [address]    Culture   Final    NO GROWTH 3 DAYS Performed at [facility], [place] [address] [place], [place] [address]    Report Status PENDING  Incomplete  Culture, blood (routine x 2)     Status: None (Preliminary result)   Collection Time: 02/05/22  3:19 PM   Specimen: BLOOD RIGHT ARM  Result Value Ref Range Status   Specimen Description   Final    BLOOD RIGHT ARM BOTTLES DRAWN AEROBIC AND ANAEROBIC Performed at [facility], [address], [place], [place] [address]    Special Requests   Final    Blood Culture results may not be optimal due to an excessive volume of blood received in culture bottles Performed at [facility], [address], [place], [place] [address]    Culture   Final    NO GROWTH 3 DAYS Performed at [facility], [place] [address] [place], [place] [address]    Report Status PENDING  Incomplete     Scheduled Meds:  apixaban  5 mg Oral BID   Chlorhexidine Gluconate Cloth  6 each Topical Q0600   furosemide  40 mg Oral Daily   insulin aspart  0-5 Units Subcutaneous QHS   insulin aspart  0-9 Units Subcutaneous TID WC   metoprolol succinate  50 mg Oral Daily   vitamin B-12  500 mcg Oral Daily   Continuous Infusions:  cefTRIAXone (ROCEPHIN)  IV 2 g (02/08/22 1528)    Procedures/Studies: CT ABDOMEN PELVIS WO CONTRAST  Result Date: 02/03/2022 CLINICAL DATA:  Abdominal pain, acute, nonlocalized. Emesis and diarrhea beginning last night. Generalized weakness. Gallbladder drain in place. EXAM: CT ABDOMEN AND PELVIS WITHOUT CONTRAST TECHNIQUE: Multidetector CT imaging of Natalie abdomen and pelvis was performed following Natalie standard protocol without IV contrast. RADIATION DOSE REDUCTION: This exam was performed according to Natalie departmental dose-optimization program which includes automated exposure control, adjustment of the mA and/or kV according to Castro size and/or use of  iterative reconstruction technique. COMPARISON:  10/30/2021 FINDINGS: Lower chest: Mild atelectasis and or scarring at Natalie lung bases as seen previously. Cardiomegaly. Pacemaker in place. Hepatobiliary: Liver cysts seen in Natalie right lobe, unchanged. Percutaneous drain in Natalie gallbladder fossa without evidence of fluid collection or ongoing inflammatory change. Is this drain being actively managed? Pancreas: Normal Spleen: Normal Adrenals/Urinary Tract: Adrenal glands are normal. Kidneys are normal. Bladder is normal. Stomach/Bowel: Stomach and small intestine are normal. Normal appendix. Ordinary diverticulosis of Natalie left colon without evidence of diverticulitis. Vascular/Lymphatic: Aortic atherosclerosis. No aneurysm. IVC is normal. No adenopathy. Reproductive: No pelvic mass. Other: No free fluid or air. Musculoskeletal: Ordinary spinal curvature and degenerative change. IMPRESSION: Drain remains in place in Natalie gallbladder fossa region. No evidence of abscess or residual inflammation in that region. Is this catheter being actively managed? Aortic Atherosclerosis (ICD10-I70.0). No other significant or acute finding. Electronically Signed   By: Nelson Chimes M.D.   On: 02/03/2022 22:25   DG Chest 2 View  Result Date: 02/03/2022 CLINICAL DATA:  Suspected sepsis.  Vomiting, diarrhea, weakness EXAM: CHEST - 2 VIEW COMPARISON:  08/22/2021 FINDINGS: Cardiac pacemaker. Heart size and pulmonary vascularity are normal. Lungs are clear. No pleural effusions. No pneumothorax. Mediastinal contours  appear intact. Calcification of Natalie aorta. Degenerative changes in Natalie spine and shoulders. Pigtail drainage catheter in Natalie right upper quadrant. IMPRESSION: No active cardiopulmonary disease. Electronically Signed   By: Lucienne Capers M.D.   On: 02/03/2022 19:59   US Abdomen Limited  Result Date: 02/04/2022 CLINICAL DATA:  Sepsis.  Percutaneous cholecystostomy 11/04/2021. EXAM: ULTRASOUND ABDOMEN LIMITED RIGHT UPPER  QUADRANT COMPARISON:  CT of 1 day prior FINDINGS: Gallbladder: Presumably decompressed by cholecystostomy catheter. Common bile duct: Diameter: Upper normal for age, 8 mm. Liver: Poorly visualized. No gross abnormality identified. Portal vein is patent on color Doppler imaging with normal direction of blood flow towards Natalie liver. Other: Generally limited exam secondary to overlying bowel gas and bandaging. IMPRESSION: Limitations as detailed above. Gallbladder not well visualized, presumably decompressed by cholecystostomy catheter as on yesterday's CT. Electronically Signed   By: Abigail Miyamoto M.D.   On: 02/04/2022 13:07    Orson Eva, DO  [facility]  If 7PM-7AM, please contact night-coverage [email or web address] Password [facility] 02/08/2022, 7:52 PM   LOS: 4 days

## 2022-02-08 NOTE — Plan of Care (Signed)
°  Problem: Fluid Volume: Goal: Hemodynamic stability will improve Outcome: Progressing   Problem: Clinical Measurements: Goal: Diagnostic test results will improve Outcome: Progressing Goal: Signs and symptoms of infection will decrease Outcome: Progressing   Problem: Respiratory: Goal: Ability to maintain adequate ventilation will improve Outcome: Progressing   Problem: Education: Goal: Knowledge of General Education information will improve Description: Including pain rating scale, medication(s)/side effects and non-pharmacologic comfort measures Outcome: Progressing   Problem: Health Behavior/Discharge Planning: Goal: Ability to manage health-related needs will improve Outcome: Progressing   Problem: Activity: Goal: Risk for activity intolerance will decrease Outcome: Progressing   Problem: Nutrition: Goal: Adequate nutrition will be maintained Outcome: Progressing   Problem: Elimination: Goal: Will not experience complications related to bowel motility Outcome: Progressing Goal: Will not experience complications related to urinary retention Outcome: Progressing   Problem: Pain Managment: Goal: General experience of comfort will improve Outcome: Progressing   Problem: Safety: Goal: Ability to remain free from injury will improve Outcome: Progressing   Problem: Skin Integrity: Goal: Risk for impaired skin integrity will decrease Outcome: Progressing

## 2022-02-08 NOTE — Assessment & Plan Note (Addendum)
Stable on ceftriaxone Switch to po cefadroxil po at time of d/c with last day on 02/14/22

## 2022-02-08 NOTE — TOC Progression Note (Signed)
Transition of Care [facility]) - Progression Note    Patient Details  Name: ANATASIA TINO MRN: [id number] Date of Birth: 03/25/1941  [facility] CM/SW Contact  Ihor Gully, LCSW Phone Number: 02/08/2022, 10:36 AM  Clinical Narrative:    Daughter, [name], accepts bed offer from [facility]. Keri at [facility] to start auth.    Expected Discharge Plan: Home/Self Care Barriers to Discharge: Continued Medical Work up  Expected Discharge Plan and Services Expected Discharge Plan: Home/Self Care In-house Referral: Clinical Social Work Discharge Planning Services: CM Consult   Living arrangements for the past 2 months: Single Family Home                                       Social Determinants of Health (SDOH) Interventions    Readmission Risk Interventions Readmission Risk Prevention Plan 07/11/2021  Transportation Screening Complete  PCP or Specialist Appt within 5-7 Days Complete  Home Care Screening Complete  Medication Review (RN CM) Complete  Some recent data might be hidden

## 2022-02-08 NOTE — Care Management Important Message (Signed)
Important Message  Patient Details  Name: Natalie Castro MRN: [id number] Date of Birth: 10-Nov-1941   Medicare Important Message Given:  N/A - LOS <3 / Initial given by admissions     Tommy Medal 02/08/2022, 10:33 AM

## 2022-02-09 ENCOUNTER — Inpatient Hospital Stay
Admission: RE | Admit: 2022-02-09 | Discharge: 2022-03-12 | Disposition: A | Discharge status: Home / Self Care | Payer: Medicare HMO | Source: Ambulatory Visit | Attending: Internal Medicine | Admitting: Internal Medicine

## 2022-02-09 DIAGNOSIS — K219 Gastro-esophageal reflux disease without esophagitis: Secondary | ICD-10-CM | POA: Diagnosis not present

## 2022-02-09 DIAGNOSIS — N179 Acute kidney failure, unspecified: Secondary | ICD-10-CM | POA: Diagnosis not present

## 2022-02-09 DIAGNOSIS — I110 Hypertensive heart disease with heart failure: Secondary | ICD-10-CM | POA: Diagnosis not present

## 2022-02-09 DIAGNOSIS — B9620 Unspecified Escherichia coli [E. coli] as the cause of diseases classified elsewhere: Secondary | ICD-10-CM

## 2022-02-09 DIAGNOSIS — A419 Sepsis, unspecified organism: Secondary | ICD-10-CM | POA: Diagnosis not present

## 2022-02-09 DIAGNOSIS — E1159 Type 2 diabetes mellitus with other circulatory complications: Secondary | ICD-10-CM | POA: Diagnosis not present

## 2022-02-09 DIAGNOSIS — K819 Cholecystitis, unspecified: Principal | ICD-10-CM

## 2022-02-09 DIAGNOSIS — R6520 Severe sepsis without septic shock: Secondary | ICD-10-CM | POA: Diagnosis not present

## 2022-02-09 DIAGNOSIS — E441 Mild protein-calorie malnutrition: Secondary | ICD-10-CM | POA: Diagnosis not present

## 2022-02-09 DIAGNOSIS — D696 Thrombocytopenia, unspecified: Secondary | ICD-10-CM | POA: Diagnosis not present

## 2022-02-09 DIAGNOSIS — M6281 Muscle weakness (generalized): Secondary | ICD-10-CM | POA: Diagnosis not present

## 2022-02-09 DIAGNOSIS — E785 Hyperlipidemia, unspecified: Secondary | ICD-10-CM | POA: Diagnosis not present

## 2022-02-09 DIAGNOSIS — R7401 Elevation of levels of liver transaminase levels: Secondary | ICD-10-CM | POA: Diagnosis not present

## 2022-02-09 DIAGNOSIS — I10 Essential (primary) hypertension: Secondary | ICD-10-CM | POA: Diagnosis not present

## 2022-02-09 DIAGNOSIS — R7881 Bacteremia: Secondary | ICD-10-CM | POA: Diagnosis not present

## 2022-02-09 DIAGNOSIS — A0811 Acute gastroenteropathy due to Norwalk agent: Secondary | ICD-10-CM | POA: Diagnosis not present

## 2022-02-09 DIAGNOSIS — I700 Atherosclerosis of aorta: Secondary | ICD-10-CM | POA: Diagnosis not present

## 2022-02-09 DIAGNOSIS — K810 Acute cholecystitis: Secondary | ICD-10-CM | POA: Diagnosis not present

## 2022-02-09 DIAGNOSIS — E1122 Type 2 diabetes mellitus with diabetic chronic kidney disease: Secondary | ICD-10-CM | POA: Diagnosis not present

## 2022-02-09 DIAGNOSIS — I5032 Chronic diastolic (congestive) heart failure: Secondary | ICD-10-CM | POA: Diagnosis not present

## 2022-02-09 DIAGNOSIS — I129 Hypertensive chronic kidney disease with stage 1 through stage 4 chronic kidney disease, or unspecified chronic kidney disease: Secondary | ICD-10-CM | POA: Diagnosis not present

## 2022-02-09 DIAGNOSIS — I152 Hypertension secondary to endocrine disorders: Secondary | ICD-10-CM | POA: Diagnosis not present

## 2022-02-09 DIAGNOSIS — E538 Deficiency of other specified B group vitamins: Secondary | ICD-10-CM | POA: Diagnosis not present

## 2022-02-09 DIAGNOSIS — I442 Atrioventricular block, complete: Secondary | ICD-10-CM | POA: Diagnosis not present

## 2022-02-09 DIAGNOSIS — K812 Acute cholecystitis with chronic cholecystitis: Secondary | ICD-10-CM | POA: Diagnosis not present

## 2022-02-09 DIAGNOSIS — N182 Chronic kidney disease, stage 2 (mild): Secondary | ICD-10-CM | POA: Diagnosis not present

## 2022-02-09 LAB — GLUCOSE, CAPILLARY
Glucose-Capillary: 156 mg/dL — ABNORMAL HIGH (ref 70–99)
Glucose-Capillary: 243 mg/dL — ABNORMAL HIGH (ref 70–99)

## 2022-02-09 MED ORDER — CEFADROXIL 500 MG PO CAPS
1000.0000 mg | ORAL_CAPSULE | Freq: Two times a day (BID) | ORAL | Status: DC
  Administered 2022-02-09: 1000 mg via ORAL
  Filled 2022-02-09 (×5): qty 2

## 2022-02-09 MED ORDER — CEFADROXIL 500 MG PO CAPS: 500.0000 mg | ORAL_CAPSULE | Freq: Two times a day (BID) | ORAL | Status: DC

## 2022-02-09 MED ORDER — CYANOCOBALAMIN 500 MCG PO TABS: 500.0000 ug | ORAL_TABLET | Freq: Every day | ORAL | Status: DC

## 2022-02-09 MED ORDER — MAGNESIUM SULFATE 4 GM/100ML IV SOLN
4.0000 g | Freq: Once | INTRAVENOUS | Status: AC
  Administered 2022-02-09: 4 g via INTRAVENOUS
  Filled 2022-02-09: qty 100

## 2022-02-09 MED ORDER — LANTUS SOLOSTAR 100 UNIT/ML ~~LOC~~ SOPN: 10.0000 [IU] | PEN_INJECTOR | Freq: Every day | SUBCUTANEOUS | 1 refills | Status: DC

## 2022-02-09 MED ORDER — METOPROLOL SUCCINATE ER 50 MG PO TB24: 50.0000 mg | ORAL_TABLET | Freq: Every day | ORAL | 3 refills | Status: DC

## 2022-02-09 MED ORDER — CEFADROXIL 500 MG PO CAPS: 1000.0000 mg | ORAL_CAPSULE | Freq: Two times a day (BID) | ORAL | 0 refills | Status: AC

## 2022-02-09 MED ORDER — POTASSIUM CHLORIDE CRYS ER 20 MEQ PO TBCR
40.0000 meq | EXTENDED_RELEASE_TABLET | ORAL | Status: DC
  Administered 2022-02-09: 40 meq via ORAL
  Filled 2022-02-09: qty 2

## 2022-02-09 NOTE — Progress Notes (Signed)
Patient discharged to [facility], transported by staff to facility in wheelchair. Discharge paperwork placed in packet to give to facility. Belongings sent with patient.

## 2022-02-09 NOTE — Discharge Summary (Addendum)
Physician Discharge Summary   Patient: Natalie Castro MRN: [id number] DOB: 03/30/1941  Admit date:     02/03/2022  Discharge date: 02/09/22  Discharge Physician: Kathie Dike   PCP: [facility]   Recommendations at discharge:    Patient has outpatient referral to interventional radiology to readdress cholecystostomy tube Restart losartan, hydralazine, nifedipine as blood pressure tolerates  Discharge Diagnoses: Principal Problem:   Severe sepsis ([place]) Active Problems:   Controlled type 2 diabetes mellitus without complication, without long-term current use of insulin ([place])   Essential hypertension, benign   Complete heart block (HCC)/Saint Jude pacemaker   History of pulmonary embolus (PE)   Mixed hyperlipidemia   Bacteremia, escherichia coli   AKI (acute kidney injury) ([place])   Chronic diastolic CHF (congestive heart failure) (HCC)   Acute on chronic cholecystitis   Thrombocytopenia (HCC)   Lactic acidosis   Transaminitis   Pyuria   Class 1 obesity   B12 deficiency   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 *   Hospital Course: 81 year old female with a history of HFpEF, coronary disease, diabetes mellitus type 2, complete heart block status post permanent pacemaker, hypertension, hyperlipidemia, pulmonary embolus on apixaban, and chronic cholecystitis presenting with 2-day history of abdominal pain and generalized weakness.  The patient is difficult historian at best.  History is supplemented by the patient's daughter and review of the medical record.  Apparently, the patient had a couple episodes of nausea and vomiting on 02/03/2022 with some loose stool.  There is no reports of hematochezia or melena.  The patient had subjective fevers and chills.  The patient denies any headache, chest pain, coughing, shortness of breath, hemoptysis.  She  states that she has had some dysuria.  As result, the patient was brought to the emergency department for further evaluation.  The patient has had generalized weakness for about what she had difficulty getting out of bed.  At baseline, the patient is able to ambulate with a walker.  Her daughter states that she usually flushes the cholecystotomy tube 2 times per week.  There has not been any difficulty flushing it. Notably, the patient was admitted to the hospital on 10/30/2021 to 11/05/2021 for recurrent acute acalculous cholecystitis.  A percutaneous cholecystotomy tube was again placed on 11/04/2021.  Notably, the patient also had a previous cholecystotomy tube placed on 07/08/2021.  It was removed on 09/22/2021 after 1 month capping trial. In the emergency department, the patient had was hypotensive with a blood pressure 87/53.  BMP showed sodium 141, potassium 4.1, bicarbonate 23, serum creatinine 2.11.  WBC 22.3, hemoglobin 12.8, platelets 1-45,000.  CT of the abdomen and pelvis showed bibasilar scarring and atelectasis.  There was an unchanged percutaneous drain in the gallbladder fossa without evidence of fluid collection or ongoing inflammation.  The patient was started on aztreonam and metronidazole in the ED.  Lactic acid peaked 4.1.  AST 372, ALT 301, alk phosphatase 157, total bilirubin 3.0.  She was admitted for further evaluation and treatment of sepsis.  Upon arrival to the medical floor, the patient was started on cefepime and metronidazole.  General surgery was consulted to assist with management whom recommended continuing nonoperative management as CT abd is re-assuring.  Assessment and Plan: * Severe sepsis with septic shock ([place])- (present on admission) Present on admission Patient presented with leukocytosis, tachycardia, and hypotension and acute kidney  injury Lactic acid 4.1>> 2.3 She was resuscitated with IV fluids and briefly required vasopressors Secondary to hepatobiliary source  and urinary source Initially on cefepime and metronidazole 2/11>>transitioned to [facility] pending final culture data>>>de-escalated to ceftriaxone Sepsis physiology resolved 02/09/22--no fevers x >48 hrs  Enteritis due to Norovirus Overall loose bowel movements have resolved Tolerating diet abd pain overall improving  Hypomagnesemia Mag = 1.5 replaced  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 Pt is clinically improved with improve abd pain and normalized WBC Antigen positive with neg toxin>>colonization  B12 deficiency Give 1 dose of IM B12 Start oral supplementation  Class 1 obesity- (present on admission) BMI 32.61 Lifestyle modification  Pyuria- (present on admission) Concerned about UTI given the patient's dysuria and pyuria UA 21-50 WBC Follow urine culture--neg but collected after abx started  Transaminitis due to shock liver, ruled in- (present on admission) Due to sepsis and hemodynamic changes Overall improved   Thrombocytopenia ([place])- (present on admission) Platelets 145 at the time of admission Secondary to sepsis Anticipate improvement with treatment of infectious process  Acute on chronic cholecystitis- (present on admission) Initially on metronidazole and cefepime -subsequently transitioned to [facility] pending final cultures>>de-escalated to ceftriaxone Cholecystotomy tube previously placed 11/04/2021--CT shows proper positiioning Tube is draining appropriately General surgery consult appreciate>>continue nonoperative management 02/03/2022 CT abdomen as discussed above Outpatient referral to IR made to readdress cholecystostomy tube  Chronic diastolic CHF (congestive heart failure) ([place])- (present on admission) Appears clinically euvolemic Restarted lasix on discharge  AKI (acute kidney injury) ([place])- (present on admission) Baseline Cr at 0.7-0.9 Presented with serum creatinine 2.11 Secondary to sepsis and hemodynamic  changes Hold losartan Overall improved with IV fluids>>serum creatinine 0.64 on 2/14  Bacteremia, escherichia coli- (present on admission) Stable on ceftriaxone Switch to po cefadroxil po at time of d/c with last day on 02/14/22  Mixed hyperlipidemia- (present on admission) Holding statin secondary to elevated LFTs>>restart after dc as LFTs improved  History of pulmonary embolus (PE)- (present on admission) Initially Holding apixaban temporarily in anticipation for any procedures 02/05/22--restarted apixaban  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 [name] in the outpatient setting  Essential hypertension, benign- (present on admission) Holding nifedipine, hydralazine, losartan secondary to hypotension Reintroduce antihypertensive medications as BP improves 02/05/22--restarted metoprolol succinate  Controlled type 2 diabetes mellitus without complication, without long-term current use of insulin (HCC) 10/30/2021 hemoglobin A1c 6.8 NovoLog sliding scale Will restart reduced dose lantus on discharged that can be titrated as outpatient           Consultants: general surgery Procedures performed:   Disposition: Skilled nursing facility Diet recommendation:  Discharge Diet Orders (From admission, onward)     Start     Ordered   02/09/22 0000  Diet - low sodium heart healthy        02/09/22 1249           Cardiac and Carb modified diet  DISCHARGE MEDICATION: Allergies as of 02/09/2022       Reactions   Penicillins Hives, Itching, Other (See Comments)   Has tolerated ceftriaxone 7/22 Has patient had a PCN reaction causing immediate rash, facial/tongue/throat swelling, SOB or lightheadedness with hypotension: No Has patient had a PCN reaction causing severe rash involving mucus membranes or skin necrosis: Yes Has patient had a PCN reaction that required hospitalization No Has patient had a PCN reaction occurring within  the last 10 years: No If all of the above answers are "NO", then may proceed with  Cephalosporin use.        Medication List     STOP taking these medications    hydrALAZINE 50 MG tablet Commonly known as: APRESOLINE   losartan 100 MG tablet Commonly known as: COZAAR   NIFEdipine 30 MG 24 hr tablet Commonly known as: PROCARDIA-XL/NIFEDICAL-XL       TAKE these medications    acetaminophen 500 MG tablet Commonly known as: TYLENOL Take 1,000 mg by mouth every 6 (six) hours as needed.   atorvastatin 40 MG tablet Commonly known as: LIPITOR TAKE (1) TABLET BY MOUTH AT BEDTIME. What changed: See the new instructions.   cefadroxil 500 MG capsule Commonly known as: DURICEF Take 2 capsules (1,000 mg total) by mouth 2 (two) times daily for 5 days.   Eliquis 5 MG Tabs tablet Generic drug: apixaban TAKE 1 TABLET BY MOUTH TWICE A DAY.   furosemide 40 MG tablet Commonly known as: LASIX Take 1 tablet (40 mg total) by mouth daily.   glucose blood test strip Use to check blood glucose fasting , before lunch and dinner and after your largest meal   Lantus SoloStar 100 UNIT/ML Solostar Pen Generic drug: insulin glargine Inject 10 Units into the skin at bedtime. What changed: See the new instructions.   metFORMIN 1000 MG tablet Commonly known as: GLUCOPHAGE TAKE (1) TABLET BY MOUTH TWICE DAILY.   metoprolol succinate 50 MG 24 hr tablet Commonly known as: TOPROL-XL Take 1 tablet (50 mg total) by mouth daily. Take with or immediately following a meal. What changed: when to take this   nitroGLYCERIN 0.4 MG SL tablet Commonly known as: NITROSTAT Place 1 tablet (0.4 mg total) under the tongue every 5 (five) minutes as needed for chest pain.   onetouch ultrasoft lancets Use to check glucose 4 x daily   polyethylene glycol 17 g packet Commonly known as: MIRALAX / GLYCOLAX Take 17 g by mouth daily as needed for mild constipation.   potassium chloride 10 MEQ  tablet Commonly known as: KLOR-CON Take 1 tablet (10 mEq total) by mouth daily.   vitamin B-12 500 MCG tablet Commonly known as: CYANOCOBALAMIN Take 1 tablet (500 mcg total) by mouth daily. Start taking on: February 10, 2022               Discharge Care Instructions  (From admission, onward)           Start     Ordered   02/09/22 0000  Discharge wound care:       Comments: Keep dressing around tubing clean. Flush cholecystostomy tube with 10cc saline daily   02/09/22 1249            Contact information for after-discharge care     [facility] .   Service: Skilled Nursing Contact information: [address] [phone]                     Discharge Exam: Filed Weights   02/03/22 1917 02/04/22 0332 02/05/22 0500  Weight: 88.5 kg 88.9 kg 92.3 kg   General exam: Alert, awake, oriented x 3 Respiratory system: Clear to auscultation. Respiratory effort normal. Cardiovascular system:RRR. No murmurs, rubs, gallops. Gastrointestinal system: Abdomen is nondistended, soft and nontender. No organomegaly or masses felt. Normal bowel sounds heard. Cholecystostomy tube in RUQ Central nervous system: Alert and oriented. No focal neurological deficits. Extremities: No C/C/E, +pedal pulses Skin: No rashes, lesions or ulcers  Psychiatry: Judgement and insight appear normal. Mood & affect appropriate.    Condition at discharge: good  The results of significant diagnostics from this hospitalization (including imaging, microbiology, ancillary and laboratory) are listed below for reference.   Imaging Studies: CT ABDOMEN PELVIS WO CONTRAST  Result Date: 02/03/2022 CLINICAL DATA:  Abdominal pain, acute, nonlocalized. Emesis and diarrhea beginning last night. Generalized weakness. Gallbladder drain in place. EXAM: CT ABDOMEN AND PELVIS WITHOUT CONTRAST TECHNIQUE: Multidetector CT imaging  of the abdomen and pelvis was performed following the standard protocol without IV contrast. RADIATION DOSE REDUCTION: This exam was performed according to the departmental dose-optimization program which includes automated exposure control, adjustment of the mA and/or kV according to patient size and/or use of iterative reconstruction technique. COMPARISON:  10/30/2021 FINDINGS: Lower chest: Mild atelectasis and or scarring at the lung bases as seen previously. Cardiomegaly. Pacemaker in place. Hepatobiliary: Liver cysts seen in the right lobe, unchanged. Percutaneous drain in the gallbladder fossa without evidence of fluid collection or ongoing inflammatory change. Is this drain being actively managed? Pancreas: Normal Spleen: Normal Adrenals/Urinary Tract: Adrenal glands are normal. Kidneys are normal. Bladder is normal. Stomach/Bowel: Stomach and small intestine are normal. Normal appendix. Ordinary diverticulosis of the left colon without evidence of diverticulitis. Vascular/Lymphatic: Aortic atherosclerosis. No aneurysm. IVC is normal. No adenopathy. Reproductive: No pelvic mass. Other: No free fluid or air. Musculoskeletal: Ordinary spinal curvature and degenerative change. IMPRESSION: Drain remains in place in the gallbladder fossa region. No evidence of abscess or residual inflammation in that region. Is this catheter being actively managed? Aortic Atherosclerosis (ICD10-I70.0). No other significant or acute finding. Electronically Signed   By: Nelson Chimes M.D.   On: 02/03/2022 22:25   DG Chest 2 View  Result Date: 02/03/2022 CLINICAL DATA:  Suspected sepsis.  Vomiting, diarrhea, weakness EXAM: CHEST - 2 VIEW COMPARISON:  08/22/2021 FINDINGS: Cardiac pacemaker. Heart size and pulmonary vascularity are normal. Lungs are clear. No pleural effusions. No pneumothorax. Mediastinal contours appear intact. Calcification of the aorta. Degenerative changes in the spine and shoulders. Pigtail drainage catheter in  the right upper quadrant. IMPRESSION: No active cardiopulmonary disease. Electronically Signed   By: Lucienne Capers M.D.   On: 02/03/2022 19:59   US Abdomen Limited  Result Date: 02/04/2022 CLINICAL DATA:  Sepsis.  Percutaneous cholecystostomy 11/04/2021. EXAM: ULTRASOUND ABDOMEN LIMITED RIGHT UPPER QUADRANT COMPARISON:  CT of 1 day prior FINDINGS: Gallbladder: Presumably decompressed by cholecystostomy catheter. Common bile duct: Diameter: Upper normal for age, 8 mm. Liver: Poorly visualized. No gross abnormality identified. Portal vein is patent on color Doppler imaging with normal direction of blood flow towards the liver. Other: Generally limited exam secondary to overlying bowel gas and bandaging. IMPRESSION: Limitations as detailed above. Gallbladder not well visualized, presumably decompressed by cholecystostomy catheter as on yesterday's CT. Electronically Signed   By: Abigail Miyamoto M.D.   On: 02/04/2022 13:07    Microbiology: Results for orders placed or performed during the hospital encounter of 02/03/22  Culture, blood (Routine x 2)     Status: Abnormal   Collection Time: 02/03/22  8:22 PM   Specimen: BLOOD RIGHT HAND  Result Value Ref Range Status   Specimen Description   Final    BLOOD RIGHT HAND Performed at [facility], [address] [place], [place] [address]    Special Requests   Final    BOTTLES DRAWN AEROBIC AND ANAEROBIC Blood Culture results may not be optimal due to an inadequate volume of blood received in culture  bottles Performed at [facility], [address], [place], [place] [address]    Culture  Setup Time   Final    GRAM NEGATIVE RODS IN BOTH AEROBIC AND ANAEROBIC BOTTLES Gram Stain Report Called to,Read Back By and Verified With: LOOMIS @ 5916 ON [id number] BY HENDERSON L CRITICAL RESULT CALLED TO, READ BACK BY AND VERIFIED WITH: C,MCGIVVONY RN _0  02/04/22 EB Performed at [facility], [place] [address] [place], [place] [address]    Culture ESCHERICHIA  COLI (A)  Final   Report Status 02/06/2022 FINAL  Final   Organism ID, Bacteria ESCHERICHIA COLI  Final      Susceptibility   Escherichia coli - MIC*    AMPICILLIN <=2 SENSITIVE Sensitive     CEFAZOLIN <=4 SENSITIVE Sensitive     CEFEPIME <=0.12 SENSITIVE Sensitive     CEFTAZIDIME <=1 SENSITIVE Sensitive     CEFTRIAXONE <=0.25 SENSITIVE Sensitive     CIPROFLOXACIN <=0.25 SENSITIVE Sensitive     GENTAMICIN <=1 SENSITIVE Sensitive     IMIPENEM <=0.25 SENSITIVE Sensitive     TRIMETH/SULFA <=20 SENSITIVE Sensitive     AMPICILLIN/SULBACTAM <=2 SENSITIVE Sensitive     PIP/TAZO <=4 SENSITIVE Sensitive     * ESCHERICHIA COLI  Culture, blood (Routine x 2)     Status: Abnormal   Collection Time: 02/03/22  8:22 PM   Specimen: Left Antecubital; Blood  Result Value Ref Range Status   Specimen Description   Final    LEFT ANTECUBITAL Performed at [facility], [address] [place], [place] [address]    Special Requests   Final    BOTTLES DRAWN AEROBIC AND ANAEROBIC Blood Culture adequate volume Performed at [facility], [address] [place], [place] [address]    Culture  Setup Time   Final    GRAM NEGATIVE RODS IN BOTH AEROBIC AND ANAEROBIC BOTTLES Gram Stain Report Called to,Read Back By and Verified With: LOOMIS @ 0909 ON [id number] BY HENDERSON L Performed at [facility], [address] [place], [place] [address]    Culture (A)  Final    ESCHERICHIA COLI SUSCEPTIBILITIES PERFORMED ON PREVIOUS CULTURE WITHIN THE LAST 5 DAYS. Performed at [facility], [place] [address], [place], [place] [address]    Report Status 02/06/2022 FINAL  Final  Blood Culture ID Panel (Reflexed)     Status: Abnormal   Collection Time: 02/03/22  8:22 PM  Result Value Ref Range Status   Enterococcus faecalis NOT DETECTED NOT DETECTED Final   Enterococcus Faecium NOT DETECTED NOT DETECTED Final   Listeria monocytogenes NOT DETECTED NOT DETECTED Final   Staphylococcus species NOT DETECTED NOT DETECTED Final    Staphylococcus aureus (BCID) NOT DETECTED NOT DETECTED Final   Staphylococcus epidermidis NOT DETECTED NOT DETECTED Final   Staphylococcus lugdunensis NOT DETECTED NOT DETECTED Final   Streptococcus species NOT DETECTED NOT DETECTED Final   Streptococcus agalactiae NOT DETECTED NOT DETECTED Final   Streptococcus pneumoniae NOT DETECTED NOT DETECTED Final   Streptococcus pyogenes NOT DETECTED NOT DETECTED Final   A.calcoaceticus-baumannii NOT DETECTED NOT DETECTED Final   Bacteroides fragilis NOT DETECTED NOT DETECTED Final   Enterobacterales DETECTED (A) NOT DETECTED Final    Comment: Enterobacterales represent a large order of gram negative bacteria, not a single organism. CRITICAL RESULT CALLED TO, READ BACK BY AND VERIFIED WITH: C,MCGIVVONY RN _1  02/04/22 EB    Enterobacter cloacae complex NOT DETECTED NOT DETECTED Final   Escherichia coli DETECTED (A) NOT DETECTED Final  Comment: CRITICAL RESULT CALLED TO, READ BACK BY AND VERIFIED WITH: C,MCGIVVONY RN _0  02/04/22 EB    Klebsiella aerogenes NOT DETECTED NOT DETECTED Final   Klebsiella oxytoca NOT DETECTED NOT DETECTED Final   Klebsiella pneumoniae NOT DETECTED NOT DETECTED Final   Proteus species NOT DETECTED NOT DETECTED Final   Salmonella species NOT DETECTED NOT DETECTED Final   Serratia marcescens NOT DETECTED NOT DETECTED Final   Haemophilus influenzae NOT DETECTED NOT DETECTED Final   Neisseria meningitidis NOT DETECTED NOT DETECTED Final   Pseudomonas aeruginosa NOT DETECTED NOT DETECTED Final   Stenotrophomonas maltophilia NOT DETECTED NOT DETECTED Final   Candida albicans NOT DETECTED NOT DETECTED Final   Candida auris NOT DETECTED NOT DETECTED Final   Candida glabrata NOT DETECTED NOT DETECTED Final   Candida krusei NOT DETECTED NOT DETECTED Final   Candida parapsilosis NOT DETECTED NOT DETECTED Final   Candida tropicalis NOT DETECTED NOT DETECTED Final   Cryptococcus neoformans/gattii NOT DETECTED NOT  DETECTED Final   CTX-M ESBL NOT DETECTED NOT DETECTED Final   Carbapenem resistance IMP NOT DETECTED NOT DETECTED Final   Carbapenem resistance KPC NOT DETECTED NOT DETECTED Final   Carbapenem resistance NDM NOT DETECTED NOT DETECTED Final   Carbapenem resist OXA 48 LIKE NOT DETECTED NOT DETECTED Final   Carbapenem resistance VIM NOT DETECTED NOT DETECTED Final    Comment: Performed at [facility], [address], [place], [place] [address]  Resp Panel by RT-PCR (Flu A&B, Covid) Nasopharyngeal Swab     Status: None   Collection Time: 02/03/22  9:31 PM   Specimen: Nasopharyngeal Swab; Nasopharyngeal(NP) swabs in vial transport medium  Result Value Ref Range Status   SARS Coronavirus 2 by RT PCR NEGATIVE NEGATIVE Final    Comment: (NOTE) SARS-CoV-2 target nucleic acids are NOT DETECTED.  The SARS-CoV-2 RNA is generally detectable in upper respiratory specimens during the acute phase of infection. The lowest concentration of SARS-CoV-2 viral copies this assay can detect is 138 copies/mL. A negative result does not preclude SARS-Cov-2 infection and should not be used as the sole basis for treatment or other patient management decisions. A negative result may occur with  improper specimen collection/handling, submission of specimen other than nasopharyngeal swab, presence of viral mutation(s) within the areas targeted by this assay, and inadequate number of viral copies(<138 copies/mL). A negative result must be combined with clinical observations, patient history, and epidemiological information. The expected result is Negative.  Fact Sheet for Patients:  EntrepreneurPulse.com.au  Fact Sheet for Healthcare Providers:  IncredibleEmployment.be  This test is no t yet approved or cleared by the [place] FDA and  has been authorized for detection and/or diagnosis of SARS-CoV-2 by FDA under an Emergency Use Authorization (EUA). This EUA will  remain  in effect (meaning this test can be used) for the duration of the COVID-19 declaration under Section 564(b)(1) of the Act, 21 U.S.C.section 360bbb-3(b)(1), unless the authorization is terminated  or revoked sooner.       Influenza A by PCR NEGATIVE NEGATIVE Final   Influenza B by PCR NEGATIVE NEGATIVE Final    Comment: (NOTE) The Xpert Xpress SARS-CoV-2/FLU/RSV plus assay is intended as an aid in the diagnosis of influenza from Nasopharyngeal swab specimens and should not be used as a sole basis for treatment. Nasal washings and aspirates are unacceptable for Xpert Xpress SARS-CoV-2/FLU/RSV testing.  Fact Sheet for Patients: EntrepreneurPulse.com.au  Fact Sheet for Healthcare Providers: IncredibleEmployment.be  This test is not yet approved or  cleared by the [place] and has been authorized for detection and/or diagnosis of SARS-CoV-2 by FDA under an Emergency Use Authorization (EUA). This EUA will remain in effect (meaning this test can be used) for the duration of the COVID-19 declaration under Section 564(b)(1) of the Act, 21 U.S.C. section 360bbb-3(b)(1), unless the authorization is terminated or revoked.  Performed at [facility], [address], [place], [place] [address]   MRSA Next Gen by PCR, Nasal     Status: None   Collection Time: 02/04/22  2:58 AM  Result Value Ref Range Status   MRSA by PCR Next Gen NOT DETECTED NOT DETECTED Final    Comment: (NOTE) The GeneXpert MRSA Assay (FDA approved for NASAL specimens only), is one component of a comprehensive MRSA colonization surveillance program. It is not intended to diagnose MRSA infection nor to guide or monitor treatment for MRSA infections. Test performance is not FDA approved in patients less than 66 years old. Performed at [facility], [address], [place], [place] [address]   Urine Culture     Status: None   Collection Time: 02/04/22  3:10 AM    Specimen: Urine, Clean Catch  Result Value Ref Range Status   Specimen Description   Final    URINE, CLEAN CATCH Performed at [facility], [address], [place], [place] [address]    Special Requests   Final    NONE Performed at [facility], [address] [place], [place] [address]    Culture   Final    NO GROWTH Performed at [facility], [place] [address] [place], [place] [address]    Report Status 02/05/2022 FINAL  Final  C Difficile Quick Screen w PCR reflex     Status: Abnormal   Collection Time: 02/04/22  4:41 PM   Specimen: STOOL  Result Value Ref Range Status   C Diff antigen POSITIVE (A) NEGATIVE Final   C Diff toxin NEGATIVE NEGATIVE Final   C Diff interpretation Results are indeterminate. See PCR results.  Final    Comment: Performed at [facility], [address] [place], [place] [id number]  Gastrointestinal Panel by PCR , Stool     Status: Abnormal   Collection Time: 02/04/22  4:41 PM   Specimen: STOOL  Result Value Ref Range Status   Campylobacter species NOT DETECTED NOT DETECTED Final   Plesimonas shigelloides NOT DETECTED NOT DETECTED Final   Salmonella species NOT DETECTED NOT DETECTED Final   Yersinia enterocolitica NOT DETECTED NOT DETECTED Final   Vibrio species NOT DETECTED NOT DETECTED Final   Vibrio cholerae NOT DETECTED NOT DETECTED Final   Enteroaggregative E coli (EAEC) NOT DETECTED NOT DETECTED Final   Enteropathogenic E coli (EPEC) NOT DETECTED NOT DETECTED Final   Enterotoxigenic E coli (ETEC) NOT DETECTED NOT DETECTED Final   Shiga like toxin producing E coli (STEC) NOT DETECTED NOT DETECTED Final   Shigella/Enteroinvasive E coli (EIEC) NOT DETECTED NOT DETECTED Final   Cryptosporidium NOT DETECTED NOT DETECTED Final   Cyclospora cayetanensis NOT DETECTED NOT DETECTED Final   Entamoeba histolytica NOT DETECTED NOT DETECTED Final   Giardia lamblia NOT DETECTED NOT DETECTED Final   Adenovirus F40/41 NOT DETECTED NOT DETECTED Final    Astrovirus NOT DETECTED NOT DETECTED Final   Norovirus GI/GII DETECTED (A) NOT DETECTED Final    Comment: RESULT CALLED TO, READ BACK BY AND VERIFIED WITH: PHOEBE BROWN _0  ON 02/05/22 SKL    Rotavirus A NOT DETECTED NOT DETECTED  Final   Sapovirus (I, II, IV, and V) NOT DETECTED NOT DETECTED Final    Comment: Performed at [facility], [address] [place], [place] [address]  C. Diff by PCR, Reflexed     Status: Abnormal   Collection Time: 02/04/22  4:41 PM  Result Value Ref Range Status   Toxigenic C. Difficile by PCR POSITIVE (A) NEGATIVE Final    Comment: Positive for toxigenic C. difficile with little to no toxin production. Only treat if clinical presentation suggests symptomatic illness. Performed at [facility], [place] [address], [place], [place] [address]   Culture, blood (routine x 2)     Status: None (Preliminary result)   Collection Time: 02/05/22  3:05 PM   Specimen: BLOOD RIGHT HAND  Result Value Ref Range Status   Specimen Description   Final    BLOOD RIGHT HAND BOTTLES DRAWN AEROBIC AND ANAEROBIC   Special Requests   Final    Blood Culture results may not be optimal due to an excessive volume of blood received in culture bottles   Culture   Final    NO GROWTH 4 DAYS Performed at [facility], [address] [place], [place] [address]    Report Status PENDING  Incomplete  Culture, blood (routine x 2)     Status: None (Preliminary result)   Collection Time: 02/05/22  3:19 PM   Specimen: BLOOD RIGHT ARM  Result Value Ref Range Status   Specimen Description   Final    BLOOD RIGHT ARM BOTTLES DRAWN AEROBIC AND ANAEROBIC   Special Requests   Final    Blood Culture results may not be optimal due to an excessive volume of blood received in culture bottles   Culture   Final    NO GROWTH 4 DAYS Performed at [facility], [address] [place], [place] [address]    Report Status PENDING  Incomplete    Labs: CBC: Recent Labs  Lab 02/03/22 2022  02/04/22 0334 02/05/22 0543 02/06/22 0543 02/08/22 0551  WBC 22.3* 20.3* 8.9 6.6 5.5  NEUTROABS 19.6*  --   --   --   --   HGB 12.8 10.8* 10.5* 10.0* 9.4*  HCT 40.8 35.1* 33.3* 32.8* 29.1*  MCV 102.8* 104.5* 102.1* 99.7 100.0  PLT 145* 114* 85* 97* 518*   Basic Metabolic Panel: Recent Labs  Lab 02/03/22 2022 02/04/22 0334 02/05/22 0543 02/06/22 0543 02/08/22 0551  NA 141 141 142 140 138  K 4.1 4.3 3.5 3.7 3.3*  CL 103 110 108 108 107  CO2 _0 GLUCOSE 121* 159* 141* 149* 182*  BUN 22 26* _1 CREATININE 2.11* 1.69* 0.88 0.76 0.64  CALCIUM 8.8* 7.8* 7.8* 8.1* 7.7*  MG  --  1.5* 1.5* 1.8 1.4*  PHOS  --  3.9  --   --   --    Liver Function Tests: Recent Labs  Lab 02/03/22 2022 02/04/22 0334 02/05/22 0543 02/06/22 0543  AST 372* 219* 66* 29  ALT 301* 223* 116* 75*  ALKPHOS 157* 124 99 88  BILITOT 3.0* 1.9* 0.9 0.6  PROT 7.7 6.3* 5.5* 5.7*  ALBUMIN 3.4* 2.7* 2.1* 2.0*   CBG: Recent Labs  Lab 02/08/22 1113 02/08/22 1602 02/08/22 2206 02/09/22 0745 02/09/22 1105  GLUCAP 193* 155* 157* 156* 243*    Discharge time spent: greater than 30 minutes.  Signed: Kathie Dike, MD [facility] 02/09/2022

## 2022-02-09 NOTE — TOC Transition Note (Signed)
Transition of Care [facility]) - CM/SW Discharge Note   Patient Details  Name: KARYNN DEBLASI MRN: [id number] Date of Birth: Feb 12, 1941  Transition of Care [facility]) CM/SW Contact:  Ihor Gully, LCSW Phone Number: 02/09/2022, 11:40 AM   Clinical Narrative:    D/c clinicals sent to facility. RN to call report. TOC signing off.    Final next level of care: Skilled Nursing Facility Barriers to Discharge: No Barriers Identified   Patient Goals and CMS Choice Patient states their goals for this hospitalization and ongoing recovery are:: Return home CMS Medicare.gov Compare Post Acute Care list provided to:: Patient Choice offered to / list presented to : Patient, Adult Children  Discharge Placement              Patient chooses bed at: [facility] Patient to be transferred to facility by: staff Name of family member notified: [name], daughter Patient and family notified of of transfer: 02/09/22  Discharge Plan and Services In-house Referral: Clinical Social Work Discharge Planning Services: CM Consult                                 Social Determinants of Health ([place]) Interventions     Readmission Risk Interventions Readmission Risk Prevention Plan 07/11/2021  Transportation Screening Complete  PCP or Specialist Appt within 5-7 Days Complete  Home Care Screening Complete  Medication Review (RN CM) Complete  Some recent data might be hidden

## 2022-02-10 LAB — CULTURE, BLOOD (ROUTINE X 2)
Culture: NO GROWTH
Culture: NO GROWTH

## 2022-02-11 ENCOUNTER — Encounter: Payer: Self-pay | Admitting: Adult Health

## 2022-02-11 ENCOUNTER — Non-Acute Institutional Stay (SKILLED_NURSING_FACILITY): Payer: Medicare HMO | Admitting: Adult Health

## 2022-02-11 DIAGNOSIS — E43 Unspecified severe protein-calorie malnutrition: Secondary | ICD-10-CM

## 2022-02-11 DIAGNOSIS — E669 Obesity, unspecified: Secondary | ICD-10-CM

## 2022-02-11 DIAGNOSIS — I129 Hypertensive chronic kidney disease with stage 1 through stage 4 chronic kidney disease, or unspecified chronic kidney disease: Secondary | ICD-10-CM

## 2022-02-11 DIAGNOSIS — D696 Thrombocytopenia, unspecified: Secondary | ICD-10-CM | POA: Diagnosis not present

## 2022-02-11 DIAGNOSIS — D638 Anemia in other chronic diseases classified elsewhere: Secondary | ICD-10-CM

## 2022-02-11 DIAGNOSIS — R7401 Elevation of levels of liver transaminase levels: Secondary | ICD-10-CM

## 2022-02-11 DIAGNOSIS — K812 Acute cholecystitis with chronic cholecystitis: Secondary | ICD-10-CM | POA: Diagnosis not present

## 2022-02-11 DIAGNOSIS — E1169 Type 2 diabetes mellitus with other specified complication: Secondary | ICD-10-CM

## 2022-02-11 DIAGNOSIS — E785 Hyperlipidemia, unspecified: Secondary | ICD-10-CM

## 2022-02-11 DIAGNOSIS — I700 Atherosclerosis of aorta: Secondary | ICD-10-CM

## 2022-02-11 DIAGNOSIS — I5032 Chronic diastolic (congestive) heart failure: Secondary | ICD-10-CM | POA: Diagnosis not present

## 2022-02-11 DIAGNOSIS — N179 Acute kidney failure, unspecified: Secondary | ICD-10-CM

## 2022-02-11 DIAGNOSIS — Z86711 Personal history of pulmonary embolism: Secondary | ICD-10-CM

## 2022-02-11 DIAGNOSIS — E1122 Type 2 diabetes mellitus with diabetic chronic kidney disease: Secondary | ICD-10-CM

## 2022-02-11 DIAGNOSIS — I110 Hypertensive heart disease with heart failure: Secondary | ICD-10-CM | POA: Diagnosis not present

## 2022-02-11 DIAGNOSIS — Z221 Carrier of other intestinal infectious diseases: Secondary | ICD-10-CM

## 2022-02-11 DIAGNOSIS — A419 Sepsis, unspecified organism: Secondary | ICD-10-CM

## 2022-02-11 DIAGNOSIS — E538 Deficiency of other specified B group vitamins: Secondary | ICD-10-CM

## 2022-02-11 DIAGNOSIS — N182 Chronic kidney disease, stage 2 (mild): Secondary | ICD-10-CM

## 2022-02-11 DIAGNOSIS — R6520 Severe sepsis without septic shock: Secondary | ICD-10-CM

## 2022-02-11 DIAGNOSIS — I442 Atrioventricular block, complete: Secondary | ICD-10-CM | POA: Diagnosis not present

## 2022-02-11 LAB — AEROBIC CULTURE W GRAM STAIN (SUPERFICIAL SPECIMEN)
Culture: NORMAL
Gram Stain: NONE SEEN

## 2022-02-11 NOTE — Progress Notes (Signed)
Location:  [place] Room Number: 132-P Place of Service:  SNF (31)   CODE STATUS: Full Code  Allergies  Allergen Reactions   Penicillins Hives, Itching and Other (See Comments)    Has tolerated ceftriaxone 7/22 Has patient had a PCN reaction causing immediate rash, facial/tongue/throat swelling, SOB or lightheadedness with hypotension: No Has patient had a PCN reaction causing severe rash involving mucus membranes or skin necrosis: Yes Has patient had a PCN reaction that required hospitalization No Has patient had a PCN reaction occurring within the last 10 years: No If all of the above answers are "NO", then may proceed with Cephalosporin use.     Chief Complaint  Patient presents with   Hospitalization Follow-up    HPI:  She is a 81 year old woman who has been hospitalized from 02-03-22 through 02-09-22. Her medical history includes: cad; diabetes; CHB with pace maker; and PE. She is a poor historian. She had n/v X 2 on 02-03-22. She also had fever and chills. She was then brought to the ED for evaluation. She had a chole drain re-inserted on 11-14-21. She was hospitalized for severe sepsis with septic shock.secondary to hepatobiliary her urine culture was negative for growth. Her blood culture grew out e-coli.  Lactic acid at 4.1. she was resuscitated with IVF. She was started on cefepime and metronidazole then transitioned to [facility] and deescalated rocephin. She has not had fevers in the past 40 hours. She developed enteritis due to norovirus: her stools are less frequent and is tolerating her diet. She is colonized for c-diff she had antigen positive and neg toxin = colonization. Her liver enzymes have improved. For as her acute on chronic cholecystitis has chole drain; surgery consulted continue nonoperative management.  She is here for short term rehab with her goal to return back home. She denies any pain at this time. Has no other complaints or concerns. 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, long term anticoag with apixaban    Hyperlipidemia    Myoview 03/2021    Myoview 4/22: EF 41, no infarct or ischemia; low risk   Presence of permanent cardiac pacemaker    Type 2 diabetes mellitus ([place])     Past Surgical History:  Procedure Laterality Date   COLONOSCOPY     3 SIMPLE ADENOMAS, AGE 71   COLONOSCOPY N/A 05/24/2018   Procedure: COLONOSCOPY;  Surgeon: Danie Binder, MD;  [facility];  Service: Endoscopy;  Laterality: N/A;  1:00pm   EP IMPLANTABLE DEVICE N/A 11/24/2015   Procedure: Pacemaker Implant;  Surgeon: [name], MD;  Location: [facility];  Service: Cardiovascular;  Laterality: N/A;   IR EXCHANGE BILIARY DRAIN  07/27/2021   IR EXCHANGE BILIARY DRAIN  08/23/2021   IR PERC CHOLECYSTOSTOMY  07/08/2021   IR PERC CHOLECYSTOSTOMY  11/04/2021   IR RADIOLOGIST EVAL & MGMT  09/22/2021   JOINT REPLACEMENT     knees bilat.   LEFT HEART CATH AND CORONARY ANGIOGRAPHY  N/A 07/02/2021   Procedure: LEFT HEART CATH AND CORONARY ANGIOGRAPHY;  Surgeon: Jettie Booze, MD;  Location: [place] [facility];  Service: Cardiovascular;  Laterality: N/A;   POLYPECTOMY  05/24/2018   Procedure: POLYPECTOMY;  Surgeon: Danie Binder, MD;  [facility];  Service: Endoscopy;;  ascending and hepatic flexure, transverse   TEE WITHOUT CARDIOVERSION N/A 08/27/2021   Procedure: TRANSESOPHAGEAL ECHOCARDIOGRAM (TEE);  Surgeon: Pixie Casino, MD;  Location: [facility];  Service: Cardiovascular;  Laterality: N/A;   TUBAL LIGATION       Social History   Socioeconomic History   Marital status: Widowed    Spouse name: Not on file   Number of children: Not on file   Years of education: Not on file   Highest education level: Not on file  Occupational History   Occupation: retired  Tobacco Use   Smoking status: Never   Smokeless tobacco: Never  Vaping Use   Vaping Use: Never used  Substance and Sexual Activity   Alcohol use: No    Alcohol/week: 0.0 standard drinks   Drug use: No   Sexual activity: Yes    Birth control/protection: Post-menopausal  Other Topics Concern   Not on file  Social History Narrative   MARRIED FOR 65 YRS. 5 KIDS: #4 PRESENT TODAY(AGE 74)   Social Determinants of Health   Financial Resource Strain: Not on file  Food Insecurity: Not on file  Transportation Needs: Not on file  Physical Activity: Not on file  Stress: Not on file  Social Connections: Not on file  Intimate Partner Violence: Not on file   Family History  Problem Relation Age of Onset   Heart failure Mother    Hypertension Mother    Heart failure Sister    Hypertension Father    Cancer Sister    Dementia Sister    Colon cancer Neg Hx    Colon polyps Neg Hx       VITAL SIGNS BP 133/76    Pulse 71    Temp (!) 97.2 F (36.2 C)    Resp 20    Ht $R'5\' 4"'sO$  (1.626 m)    Wt 198 lb 12.8 oz (90.2 kg)    SpO2 98%    BMI 34.12 kg/m   Outpatient Encounter Medications as of 02/11/2022  Medication Sig   acetaminophen (TYLENOL) 500 MG tablet Take 1,200 mg by mouth every 6 (six) hours as needed.   atorvastatin (LIPITOR) 40 MG tablet TAKE (1) TABLET BY MOUTH AT BEDTIME. (Patient taking differently: Take 40 mg by mouth at bedtime.)   cefadroxil (DURICEF) 500 MG capsule Take 2 capsules (1,000 mg total) by mouth 2 (two) times daily for 5 days.   ELIQUIS 5 MG TABS tablet TAKE 1 TABLET BY MOUTH TWICE A DAY.   furosemide (LASIX) 40 MG tablet Take 1 tablet (40 mg total) by mouth daily.   LANTUS SOLOSTAR 100 UNIT/ML Solostar Pen  Inject 10 Units into the skin at bedtime.   metFORMIN (GLUCOPHAGE) 1000 MG tablet TAKE (1) TABLET BY MOUTH TWICE DAILY.   metoprolol succinate (TOPROL-XL) 50 MG 24 hr tablet Take 1 tablet (50 mg total) by mouth daily. Take with or immediately following a meal.   nitroGLYCERIN (NITROSTAT) 0.4 MG SL tablet Place 1 tablet (0.4 mg total) under the tongue every 5 (five) minutes as needed for chest pain.   polyethylene glycol (MIRALAX / GLYCOLAX) 17 g packet Take 17 g by mouth daily as needed for mild constipation.  potassium chloride (KLOR-CON) 10 MEQ tablet Take 1 tablet (10 mEq total) by mouth daily.   vitamin B-12 (CYANOCOBALAMIN) 500 MCG tablet Take 1 tablet (500 mcg total) by mouth daily.   glucose blood test strip Use to check blood glucose fasting , before lunch and dinner and after your largest meal   Lancets (ONETOUCH ULTRASOFT) lancets Use to check glucose 4 x daily   No facility-administered encounter medications on file as of 02/11/2022.     SIGNIFICANT DIAGNOSTIC EXAMS  TODAY;   02-03-22: chest x-ray: No active cardiopulmonary disease   02-03-22: ct of abdomen and pelvis:  Drain remains in place in the gallbladder fossa region. No evidence of abscess or residual inflammation in that region. Is this catheter being actively managed? Aortic Atherosclerosis  No other significant or acute finding.  02-04-22: abdominal ultrasound:  Gallbladder not well visualized, presumably decompressed by cholecystostomy catheter as on yesterday's CT.  LABS REVIEWED:   02-03-22: wbc 22.3; hgb 12.8; hct 102.8 plt 145; glucose 121; bun 22; creat 2.11; k+ 4.1; na++ 141; ca 8.8; GFR 23; ast 372 alt 301 alk phos 157 total bili 3.0 albumin 3.4 hgb a1c 6.5; folate >5.9 vitamin B 12: 80; blood culture: e-coli  02-04-22: wbc 20.3; hgb 10.8; hct 35.1; mcv 104.5 plt 114; glucose 159; bun 26; creat 1.69; k+ 4.3; na++ 141; total protein 6.3; ast 219; alt 223; alkphos 124; total bili 1.9; albumin 2.7; mag 1.5; phos 3.9;  c-diff: + antigen; urine culture: no growth  02-05-22: blood culture: no growth 02-06-22: wbc 6.6; hgb 10.0; hct 32.8; mcv 99.7 plt 97; glucose 149; bun 9; creat 0.76; k+ 3.7; na__ 140; ca 8.1; GFR >60; ast 29; alt 75.  total pro 5.7 albumin 2.0  Review of Systems  Constitutional:  Negative for malaise/fatigue.  Respiratory:  Negative for cough and shortness of breath.   Cardiovascular:  Negative for chest pain, palpitations and leg swelling.  Gastrointestinal:  Negative for abdominal pain, constipation and heartburn.  Musculoskeletal:  Negative for back pain, joint pain and myalgias.  Skin: Negative.   Neurological:  Negative for dizziness.  Psychiatric/Behavioral:  The patient is not nervous/anxious.    Physical Exam Constitutional:      General: She is not in acute distress.    Appearance: She is well-developed. She is not diaphoretic.  Neck:     Thyroid: No thyromegaly.  Cardiovascular:     Rate and Rhythm: Normal rate and regular rhythm.     Pulses: Normal pulses.     Heart sounds: Normal heart sounds.  Pulmonary:     Effort: Pulmonary effort is normal. No respiratory distress.     Breath sounds: Normal breath sounds.  Abdominal:     General: Bowel sounds are normal. There is no distension.     Palpations: Abdomen is soft.     Tenderness: There is no abdominal tenderness.     Comments: Chole drain   Musculoskeletal:        General: Normal range of motion.     Cervical back: Neck supple.     Right lower leg: No edema.     Left lower leg: No edema.  Lymphadenopathy:     Cervical: No cervical adenopathy.  Skin:    General: Skin is warm and dry.  Neurological:     Mental Status: She is alert and oriented to person, place, and time.  Psychiatric:        Mood and Affect: Mood normal.     ASSESSMENT/ PLAN:  TODAY  Severe sepsis / transaminitis / acute on chronic cholecystitis:  status post chole drain. Will have her complete  duricef 1 gm twice daily for 5 more days.  Will continue to monitor her status.   2. Chronic diastolic CHF (congestive heart failure) EF 50-55% (08-27-21) will continue lasix 40 mg daily with k+ 10 meq daily. Will continue toprol xl 50 mg daily and has prn ntg.   3. 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 Benign hypertension with coincident congestive heart failure: is stable b/p 133/76: will continue toprol xl 50 mg daily will monitor   5. AKI (acute kidney injury): her renal function has improved to GFR>60. Will monitor her renal function  6. Thrombocytopenia: is status post sepsis; plt count 97  7. Vitamin B 12 deficiency: level is 80; will begin weekly injections X 4 weeks then monthly   8. Obesity (BMI 30-39.9) her current BIM is 34.12 will continue to monitor her status.   9. History of pulmonary embolism: is on long term eliquis 5 mg twice daily   10. 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 Hyperlipidemia associated with type 2 diabetes mellitus: is stable will continue lipitor 40 mg daily   12. Severe protein calorie malnutrition: albumin 2.0; will begin prostat 30 cc twice daily   13. Aortic atherosclerosis: (ct 02-03-22) is on statin and anticoagulation therapy   14. Anemia associated with type 2 diabetes: hgb 10.0; will monitor   15. 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   Will check cbc; bmp    Ok Edwards NP [facility]   call [phone]

## 2022-02-14 ENCOUNTER — Non-Acute Institutional Stay (SKILLED_NURSING_FACILITY): Payer: Medicare HMO | Admitting: Internal Medicine

## 2022-02-14 ENCOUNTER — Encounter: Payer: Self-pay | Admitting: Internal Medicine

## 2022-02-14 DIAGNOSIS — N179 Acute kidney failure, unspecified: Secondary | ICD-10-CM

## 2022-02-14 DIAGNOSIS — R7401 Elevation of levels of liver transaminase levels: Secondary | ICD-10-CM

## 2022-02-14 DIAGNOSIS — E1122 Type 2 diabetes mellitus with diabetic chronic kidney disease: Secondary | ICD-10-CM | POA: Diagnosis not present

## 2022-02-14 DIAGNOSIS — I10 Essential (primary) hypertension: Secondary | ICD-10-CM | POA: Diagnosis not present

## 2022-02-14 DIAGNOSIS — K812 Acute cholecystitis with chronic cholecystitis: Secondary | ICD-10-CM

## 2022-02-14 DIAGNOSIS — E441 Mild protein-calorie malnutrition: Secondary | ICD-10-CM

## 2022-02-14 DIAGNOSIS — I129 Hypertensive chronic kidney disease with stage 1 through stage 4 chronic kidney disease, or unspecified chronic kidney disease: Secondary | ICD-10-CM

## 2022-02-14 DIAGNOSIS — N182 Chronic kidney disease, stage 2 (mild): Secondary | ICD-10-CM | POA: Diagnosis not present

## 2022-02-14 DIAGNOSIS — E119 Type 2 diabetes mellitus without complications: Secondary | ICD-10-CM | POA: Insufficient documentation

## 2022-02-14 NOTE — Assessment & Plan Note (Addendum)
2/9 - 02/09/2022 admitted with acute cholecystitis with nausea and vomiting, abdominal pain, generalized weakness.  Course complicated by sepsis, shock liver, and AKI superimposed on CKD. IR F/U appt as scheduled

## 2022-02-14 NOTE — Assessment & Plan Note (Signed)
Blood pressure control is good even though she remains off of the ARB, hydralazine, and calcium channel blocker.

## 2022-02-14 NOTE — Assessment & Plan Note (Signed)
Current A1c 6.8% indicating adequate control.  AKI has resolved and CKD is now stage II.  No change in metformin unless there is progression of CKD.

## 2022-02-14 NOTE — Assessment & Plan Note (Signed)
Current albumin is 2 and total protein 5.7.  There were no dramatic physical findings of malnutrition.  Nutritionist to assess at SNF.

## 2022-02-14 NOTE — Assessment & Plan Note (Addendum)
AST 372 and ALT of 301 in the context of sepsis with shock liver.  Resolution with rehydration and broad-spectrum antibiotics.  Prior to discharge ALT 75 and AST 29.  No change in statin dose unless there is recurrence of transaminitis. Present prn dosage of acetaminophen will be verified.  Total dose should be less than 3 g a day because of the recent shock liver.

## 2022-02-14 NOTE — Progress Notes (Signed)
NURSING HOME LOCATION: [facility] ROOM NUMBER:  [id number]  CODE STATUS:  Full Code  PCP:  [facility]  This is a comprehensive admission note to this SNFperformed on this date less than 30 days from date of admission. Included are preadmission medical/surgical history; reconciled medication list; family history; social history and comprehensive review of systems.  Corrections and additions to the records were documented. Comprehensive physical exam was also performed. Additionally a clinical summary was entered for each active diagnosis pertinent to this admission in the Problem List to enhance continuity of care.  HPI: She was hospitalized 2/9 - 02/09/2022 admitted from home with sepsis.  She presented to the ED with a 2-day history of abdominal pain and generalized weakness.  On 2/9 she had a couple of episodes of nausea and vomiting and also some loose stool.  Subjectively fever and chills were present.  Patient also described some dysuria.  At baseline the patient ambulates with a walker but was so weak she had difficulty getting out of bed.  Significant history includes placement of a cholecystotomy tube on 07/08/2021.  This was removed on 9/28 after a 1 month long capping trial.   She was readmitted to the hospital 11/5 - 11/05/2021 for recurrent acute acalculous cholecystitis.  A percutaneous cholecystotomy tube was again placed on 11/04/2021.   The cholecystotomy tube is typically flushed 2 times per week by the patient's daughter.  The daughter denies any difficulty flushing it. In the ED patient was hypotensive with a blood pressure of 87/53.  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  White count was 22,300.  Lactic acid peaked at 4.1.  Transaminitis was present with an AST of 372 and ALT of 301.  Total bilirubin was 3.0 and alk phos 157. Cefepime and metronidazole were initiated empirically.  General surgery  consulted but felt no surgical intervention was necessary. On 2/11 antibiotics were transitioned to meropenem and further de-escalated to ceftriaxone.  Sepsis physiology improved with the antibiotics and rehydration.  Lactic acid level dropped to 2.3. Clinically she was felt to have enteritis due to norovirus.  She also clinically was felt to be colonized with Clostridioides difficile.  Antigen was positive with negative toxin suggesting colonization.  The loose bowel movements did resolve.  Abdominal pain improved concurrently. E. coli bacteremia was present on admission.  Antibiotics were gradually transitioned to cefadroxil p.o. at the time of discharge to be continued until 2/20 Hypomagnesemia was addressed.  IM B12 was initiated to be transitioned to oral supplementation as maintenance. As noted the patient did describe dysuria PTA.  UA in the ED showed 21-50 WBCs.  Culture was collected after antibiotics had been started and was negative for definite UTI. Transaminitis was attributed to shock liver and improved with antibiotics and fluids. Mild thrombocytopenia was present with platelet count of 145,000. AKI resolved; creatinine was 0.64 and GFR greater than 60 predischarge. Because of the hypotension and ongoing soft blood pressures nifedipine, hydralazine, and losartan were held.  These were to be restarted and titrated as clinically indicated. Diabetes was felt to be well controlled as documented by an A1c of 6.8% on 10/30/2021.  Lantus was to be titrated as an outpatient. Because of generalized weakness she was discharged to the SNF for rehab.  IR follow-up to reassess the cholecystectomy tube was to be scheduled as an outpatient.  Past medical and surgical history: Includes GERD, colon adenomas, history  of complete heart block, essential hypertension, history of PTE, dyslipidemia, and diabetes with CKD. Surgeries and procedures include colonoscopies, coronary angiography, colon polypectomy,  and pacemaker placement.  Social history: Nondrinker; non-smoker  Family history: Extensive history reviewed   Review of systems: She states that she was in the hospital because "this here (pointing to cholecystotomy tube) was putting out a whole lot of stuff from the gallbladder".  She describes intermittent nausea, anorexia, loose stool, and occasional dysphagia and dyspepsia.  The remainder of the review of systems essentially negative.  Constitutional: No fever, significant weight change  Eyes: No redness, discharge, pain, vision change ENT/mouth: No nasal congestion, purulent discharge, earache, change in hearing, sore throat  Cardiovascular: No chest pain, palpitations, paroxysmal nocturnal dyspnea, claudication, edema  Respiratory: No cough, sputum production, hemoptysis, DOE, significant snoring, apnea  Gastrointestinal: No vomiting, rectal bleeding, melena Genitourinary: @ present no dysuria, hematuria, pyuria, incontinence, nocturia Musculoskeletal: No joint stiffness, joint swelling, weakness, pain Dermatologic: No rash, pruritus, change in appearance of skin Neurologic: No dizziness, headache, syncope, seizures, numbness, tingling Psychiatric: No significant anxiety, depression, insomnia Endocrine: No change in hair/skin/nails, excessive thirst, excessive hunger, excessive urination  Hematologic/lymphatic: No significant bruising, lymphadenopathy, abnormal bleeding Allergy/immunology: No itchy/watery eyes, significant sneezing, urticaria, angioedema  Physical exam:  Pertinent or positive findings: She appears her stated age.  Globes are prominent.  Lower lids are puffy.  The maxilla is edentulous.  She is missing mandibular teeth and the remaining teeth have poor hygiene with marked plaque formation.  Heart sounds are somewhat distant.  The rhythm is regular and slow.  Pacemaker is present.  Breath sounds are slightly decreased.  Cholecystotomy tube is present on the right  draining copper colored fluid.  Dorsalis pedis pulses are stronger than posterior tibial pulses.  Slight clubbing of the fingernails are present.  She has faint small vitiliginous lesions over the shins.  General appearance: Adequately nourished; no acute distress, increased work of breathing is present.   Lymphatic: No lymphadenopathy about the head, neck, axilla. Eyes: No conjunctival inflammation or lid edema is present. There is no scleral icterus. Ears:  External ear exam shows no significant lesions or deformities.   Nose:  External nasal examination shows no deformity or inflammation. Nasal mucosa are pink and moist without lesions, exudates Neck:  No thyromegaly, masses, tenderness noted.    Heart:  No gallop, murmur, click, rub.  Lungs: without wheezes, rhonchi, rales, rubs. Abdomen: Bowel sounds are normal.  Abdomen is soft and nontender with no organomegaly, hernias, masses. GU: Deferred  Extremities:  No cyanosis,  edema. Neurologic exam:  Balance, Rhomberg, finger to nose testing could not be completed due to clinical state Skin: Warm & dry w/o tenting. No significant lesions or rash.  See clinical summary under each active problem in the Problem List with associated updated therapeutic plan

## 2022-02-14 NOTE — Assessment & Plan Note (Signed)
Creatinine peaked at 2.11 with a GFR of 23 in the context of sepsis with shock liver.  With rehydration and antibiotics; creatinine 0.64 and GFR greater than 60 indicating CKD stage II. No change in present medicines unless there is progression of CKD.

## 2022-02-15 NOTE — Patient Instructions (Signed)
See assessment and plan under each diagnosis in the problem list and acutely for this visit 

## 2022-02-16 ENCOUNTER — Ambulatory Visit (INDEPENDENT_AMBULATORY_CARE_PROVIDER_SITE_OTHER): Payer: Medicare HMO

## 2022-02-16 DIAGNOSIS — E785 Hyperlipidemia, unspecified: Secondary | ICD-10-CM | POA: Insufficient documentation

## 2022-02-16 DIAGNOSIS — I110 Hypertensive heart disease with heart failure: Secondary | ICD-10-CM | POA: Insufficient documentation

## 2022-02-16 DIAGNOSIS — I700 Atherosclerosis of aorta: Secondary | ICD-10-CM | POA: Insufficient documentation

## 2022-02-16 DIAGNOSIS — E1169 Type 2 diabetes mellitus with other specified complication: Secondary | ICD-10-CM | POA: Insufficient documentation

## 2022-02-16 DIAGNOSIS — D638 Anemia in other chronic diseases classified elsewhere: Secondary | ICD-10-CM | POA: Insufficient documentation

## 2022-02-16 DIAGNOSIS — I442 Atrioventricular block, complete: Secondary | ICD-10-CM | POA: Diagnosis not present

## 2022-02-17 ENCOUNTER — Other Ambulatory Visit: Payer: Self-pay | Admitting: Adult Health

## 2022-02-17 ENCOUNTER — Encounter: Payer: Self-pay | Admitting: Adult Health

## 2022-02-17 ENCOUNTER — Non-Acute Institutional Stay (SKILLED_NURSING_FACILITY): Payer: Medicare HMO | Admitting: Adult Health

## 2022-02-17 ENCOUNTER — Other Ambulatory Visit (HOSPITAL_COMMUNITY)
Admission: RE | Admit: 2022-02-17 | Discharge: 2022-02-17 | Disposition: A | Discharge status: Home / Self Care | Payer: Medicare HMO | Source: Skilled Nursing Facility | Attending: Adult Health | Admitting: Adult Health

## 2022-02-17 DIAGNOSIS — I442 Atrioventricular block, complete: Secondary | ICD-10-CM

## 2022-02-17 DIAGNOSIS — R6520 Severe sepsis without septic shock: Secondary | ICD-10-CM | POA: Diagnosis not present

## 2022-02-17 DIAGNOSIS — K810 Acute cholecystitis: Secondary | ICD-10-CM | POA: Diagnosis not present

## 2022-02-17 DIAGNOSIS — A419 Sepsis, unspecified organism: Secondary | ICD-10-CM

## 2022-02-17 DIAGNOSIS — I700 Atherosclerosis of aorta: Secondary | ICD-10-CM

## 2022-02-17 DIAGNOSIS — E1159 Type 2 diabetes mellitus with other circulatory complications: Secondary | ICD-10-CM | POA: Insufficient documentation

## 2022-02-17 LAB — BASIC METABOLIC PANEL
Anion gap: 8 (ref 5–15)
BUN: 26 mg/dL — ABNORMAL HIGH (ref 8–23)
CO2: 23 mmol/L (ref 22–32)
Calcium: 9 mg/dL (ref 8.9–10.3)
Chloride: 102 mmol/L (ref 98–111)
Creatinine, Ser: 1.04 mg/dL — ABNORMAL HIGH (ref 0.44–1.00)
GFR, Estimated: 55 mL/min — ABNORMAL LOW (ref >60)
Glucose, Bld: 184 mg/dL — ABNORMAL HIGH (ref 70–99)
Potassium: 5.2 mmol/L — ABNORMAL HIGH (ref 3.5–5.1)
Sodium: 133 mmol/L — ABNORMAL LOW (ref 135–145)

## 2022-02-17 LAB — CBC
HCT: 37.4 % (ref 36.0–46.0)
Hemoglobin: 12 g/dL (ref 12.0–15.0)
MCH: 31.8 pg (ref 26.0–34.0)
MCHC: 32.1 g/dL (ref 30.0–36.0)
MCV: 99.2 fL (ref 80.0–100.0)
Platelets: 376 10*3/uL (ref 150–400)
RBC: 3.77 MIL/uL — ABNORMAL LOW (ref 3.87–5.11)
RDW: 14.2 % (ref 11.5–15.5)
WBC: 7.5 10*3/uL (ref 4.0–10.5)
nRBC: 0 % (ref 0.0–0.2)

## 2022-02-17 MED ORDER — FUROSEMIDE 40 MG PO TABS: 40.0000 mg | ORAL_TABLET | Freq: Every day | ORAL | 0 refills | Status: DC

## 2022-02-17 MED ORDER — NITROGLYCERIN 0.4 MG SL SUBL: 0.4000 mg | SUBLINGUAL_TABLET | SUBLINGUAL | 0 refills | Status: DC | PRN

## 2022-02-17 MED ORDER — POTASSIUM CHLORIDE ER 10 MEQ PO TBCR: 10.0000 meq | EXTENDED_RELEASE_TABLET | Freq: Every day | ORAL | 0 refills | Status: DC

## 2022-02-17 MED ORDER — ATORVASTATIN CALCIUM 40 MG PO TABS: 40.0000 mg | ORAL_TABLET | Freq: Every day | ORAL | 0 refills | Status: AC

## 2022-02-17 MED ORDER — METOPROLOL SUCCINATE ER 50 MG PO TB24: 50.0000 mg | ORAL_TABLET | Freq: Every day | ORAL | 0 refills | Status: DC

## 2022-02-17 MED ORDER — GLUCOSE BLOOD VI STRP: ORAL_STRIP | 0 refills | Status: AC

## 2022-02-17 MED ORDER — ONETOUCH ULTRASOFT LANCETS MISC: 0 refills | Status: AC

## 2022-02-17 MED ORDER — CYANOCOBALAMIN 1000 MCG/ML IJ SOLN: 1000.0000 ug | INTRAMUSCULAR | 0 refills | Status: DC

## 2022-02-17 MED ORDER — APIXABAN 5 MG PO TABS: 5.0000 mg | ORAL_TABLET | Freq: Two times a day (BID) | ORAL | 0 refills | Status: AC

## 2022-02-17 MED ORDER — METFORMIN HCL 1000 MG PO TABS: ORAL_TABLET | ORAL | 0 refills | Status: AC

## 2022-02-17 MED ORDER — LANTUS SOLOSTAR 100 UNIT/ML ~~LOC~~ SOPN: 10.0000 [IU] | PEN_INJECTOR | Freq: Every day | SUBCUTANEOUS | 0 refills | Status: AC

## 2022-02-17 NOTE — Progress Notes (Signed)
Location:  [place] Room Number: 132 Place of Service:  SNF (31)  Provider: Ok Edwards np   PCP: [place], [facility] Patient Care Team: [facility], [facility] as PCP - General (Family Medicine) Evans Lance, MD as PCP - Cardiology (Cardiology) Evans Lance, MD as PCP - Electrophysiology (Cardiology) Evans Lance, MD as Consulting Physician (Cardiology) Madelin Headings, DO [facility])  Extended Emergency Contact Information Primary Emergency Contact: Gerre Couch [place] Phone: [phone] Relation: Daughter Secondary Emergency Contact: Orosz,[name] Address: [address]          [place],  [address] [place] Work Phone: [phone] Relation: Daughter  Code Status: FULL CODE  Goals of care:  Advanced Directive information Advanced Directives 02/11/2022  Does Patient Have a Medical Advance Directive? No  Type of Advance Directive -  Does patient want to make changes to medical advance directive? -  Copy of [place] in Chart? -  Would patient like information on creating a medical advance directive? No - Patient declined     Allergies  Allergen Reactions   Penicillins Hives, Itching and Other (See Comments)    Has tolerated ceftriaxone 7/22 Has patient had a PCN reaction causing immediate rash, facial/tongue/throat swelling, SOB or lightheadedness with hypotension: No Has patient had a PCN reaction causing severe rash involving mucus membranes or skin necrosis: Yes Has patient had a PCN reaction that required hospitalization No Has patient had a PCN reaction occurring within the last 10 years: No If all of the above answers are "NO", then may proceed with Cephalosporin use.     Chief Complaint  Patient presents with   Discharge Note    HPI:  81 y.o. female  is being discharged to home with home health for pt/ot. She will need her  prescriptions written and will need to follow up with her medical provider. She will not need any dme. She had been hospitalized for severe sepsis with acute in chronic cholecystitis. She was admitted to this facility for short term rehab. She has participated in pt/ot to improve upon her level of independence with her adls and is now ready for discharge.     Past Medical History:  Diagnosis Date   Acid reflux    Arthritis    Colon adenomas    AGE 52   Complete heart block (HCC)    STJ PPM Dr. 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, long term anticoag with apixaban    Hyperlipidemia    Myoview 03/2021    Myoview 4/22: EF 50, no infarct or ischemia; low risk   Presence of permanent cardiac pacemaker    Type 2 diabetes mellitus ([place])     Past Surgical History:  Procedure Laterality Date   COLONOSCOPY     3 SIMPLE ADENOMAS, AGE 77   COLONOSCOPY N/A 05/24/2018   Procedure: COLONOSCOPY;  Surgeon: Danie Binder, MD;  [facility];  Service: Endoscopy;  Laterality: N/A;  1:00pm   EP IMPLANTABLE DEVICE N/A 11/24/2015   Procedure: Pacemaker Implant;  Surgeon: [name], MD;  Location: [facility];  Service: Cardiovascular;  Laterality: N/A;   IR EXCHANGE BILIARY DRAIN  07/27/2021   IR EXCHANGE BILIARY DRAIN  08/23/2021   IR PERC CHOLECYSTOSTOMY  07/08/2021   IR PERC CHOLECYSTOSTOMY  11/04/2021   IR RADIOLOGIST EVAL & MGMT  09/22/2021  JOINT REPLACEMENT     knees bilat.   LEFT HEART CATH AND CORONARY ANGIOGRAPHY N/A 07/02/2021   Procedure: LEFT HEART CATH AND CORONARY ANGIOGRAPHY;  Surgeon: Jettie Booze, MD;  Location: [facility];  Service: Cardiovascular;  Laterality: N/A;   POLYPECTOMY  05/24/2018   Procedure: POLYPECTOMY;  Surgeon: Danie Binder, MD;  [facility];  Service: Endoscopy;;  ascending and hepatic flexure, transverse   TEE WITHOUT CARDIOVERSION N/A 08/27/2021   Procedure:  TRANSESOPHAGEAL ECHOCARDIOGRAM (TEE);  Surgeon: Pixie Casino, MD;  Location: [facility] ENDOSCOPY;  Service: Cardiovascular;  Laterality: N/A;   TUBAL LIGATION        reports that she has never smoked. She has never used smokeless tobacco. She reports that she does not drink alcohol and does not use drugs. Social History   Socioeconomic History   Marital status: Widowed    Spouse name: Not on file   Number of children: Not on file   Years of education: Not on file   Highest education level: Not on file  Occupational History   Occupation: retired  Tobacco Use   Smoking status: Never   Smokeless tobacco: Never  Vaping Use   Vaping Use: Never used  Substance and Sexual Activity   Alcohol use: No    Alcohol/week: 0.0 standard drinks   Drug use: No   Sexual activity: Yes    Birth control/protection: Post-menopausal  Other Topics Concern   Not on file  Social History Narrative   MARRIED FOR 20 YRS. 5 KIDS: #4 PRESENT TODAY(AGE 70)   Social Determinants of Health   Financial Resource Strain: Not on file  Food Insecurity: Not on file  Transportation Needs: Not on file  Physical Activity: Not on file  Stress: Not on file  Social Connections: Not on file  Intimate Partner Violence: Not on file    Allergies  Allergen Reactions   Penicillins Hives, Itching and Other (See Comments)    Has tolerated ceftriaxone 7/22 Has patient had a PCN reaction causing immediate rash, facial/tongue/throat swelling, SOB or lightheadedness with hypotension: No Has patient had a PCN reaction causing severe rash involving mucus membranes or skin necrosis: Yes Has patient had a PCN reaction that required hospitalization No Has patient had a PCN reaction occurring within the last 10 years: No If all of the above answers are "NO", then may proceed with Cephalosporin use.     Pertinent  Health Maintenance Due  Topic Date Due   URINE MICROALBUMIN  06/30/2021   INFLUENZA VACCINE  07/26/2021   FOOT  EXAM  11/30/2021   HEMOGLOBIN A1C  08/05/2022   OPHTHALMOLOGY EXAM  09/29/2022   DEXA SCAN  Completed    Medications: Outpatient Encounter Medications as of 02/17/2022  Medication Sig   acetaminophen (TYLENOL) 500 MG tablet Take 1,200 mg by mouth every 6 (six) hours as needed.   atorvastatin (LIPITOR) 40 MG tablet TAKE (1) TABLET BY MOUTH AT BEDTIME. (Patient taking differently: Take 40 mg by mouth at bedtime.)   ELIQUIS 5 MG TABS tablet TAKE 1 TABLET BY MOUTH TWICE A DAY.   furosemide (LASIX) 40 MG tablet Take 1 tablet (40 mg total) by mouth daily.   glucose blood test strip Use to check blood glucose fasting , before lunch and dinner and after your largest meal   Lancets (ONETOUCH ULTRASOFT) lancets Use to check glucose 4 x daily   LANTUS SOLOSTAR 100 UNIT/ML Solostar Pen Inject 10 Units into the skin at bedtime.  metFORMIN (GLUCOPHAGE) 1000 MG tablet TAKE (1) TABLET BY MOUTH TWICE DAILY.   metoprolol succinate (TOPROL-XL) 50 MG 24 hr tablet Take 1 tablet (50 mg total) by mouth daily. Take with or immediately following a meal.   nitroGLYCERIN (NITROSTAT) 0.4 MG SL tablet Place 1 tablet (0.4 mg total) under the tongue every 5 (five) minutes as needed for chest pain.   polyethylene glycol (MIRALAX / GLYCOLAX) 17 g packet Take 17 g by mouth daily as needed for mild constipation.   potassium chloride (KLOR-CON) 10 MEQ tablet Take 1 tablet (10 mEq total) by mouth daily.   vitamin B-12 (CYANOCOBALAMIN) 500 MCG tablet Take 1 tablet (500 mcg total) by mouth daily.   No facility-administered encounter medications on file as of 02/17/2022.      Vitals:   02/17/22 1152  BP: 117/61  Pulse: 76  Resp: 18  Temp: (!) 97.2 F (36.2 C)  SpO2: 99%  Weight: 199 lb (90.3 kg)  Height: _0  (1.626 m)   Body mass index is 34.16 kg/m.   PREVIOUS   02-03-22: chest x-ray: No active cardiopulmonary disease   02-03-22: ct of abdomen and pelvis:  Drain remains in place in the gallbladder fossa  region. No evidence of abscess or residual inflammation in that region. Is this catheter being actively managed? Aortic Atherosclerosis  No other significant or acute finding.  02-04-22: abdominal ultrasound:  Gallbladder not well visualized, presumably decompressed by cholecystostomy catheter as on yesterday's CT.  NO NEW EXAMS   LABS REVIEWED: PREVIOUS   02-03-22: wbc 22.3; hgb 12.8; hct 102.8 plt 145; glucose 121; bun 22; creat 2.11; k+ 4.1; na++ 141; ca 8.8; GFR 23; ast 372 alt 301 alk phos 157 total bili 3.0 albumin 3.4 hgb a1c 6.5; folate >5.9 vitamin B 12: 80; blood culture: e-coli  02-04-22: wbc 20.3; hgb 10.8; hct 35.1; mcv 104.5 plt 114; glucose 159; bun 26; creat 1.69; k+ 4.3; na++ 141; total protein 6.3; ast 219; alt 223; alkphos 124; total bili 1.9; albumin 2.7; mag 1.5; phos 3.9; c-diff: + antigen; urine culture: no growth  02-05-22: blood culture: no growth 02-06-22: wbc 6.6; hgb 10.0; hct 32.8; mcv 99.7 plt 97; glucose 149; bun 9; creat 0.76; k+ 3.7; na__ 140; ca 8.1; GFR >60; ast 29; alt 75.  total pro 5.7 albumin 2.0  NO NEW LABS.    Review of Systems  Constitutional:  Negative for malaise/fatigue.  Respiratory:  Negative for cough and shortness of breath.   Cardiovascular:  Negative for chest pain, palpitations and leg swelling.  Gastrointestinal:  Negative for abdominal pain, constipation and heartburn.  Musculoskeletal:  Negative for back pain, joint pain and myalgias.  Skin: Negative.   Neurological:  Negative for dizziness.  Psychiatric/Behavioral:  The patient is not nervous/anxious.      Physical Exam Constitutional:      General: She is not in acute distress.    Appearance: She is well-developed. She is not diaphoretic.  Neck:     Thyroid: No thyromegaly.  Cardiovascular:     Rate and Rhythm: Normal rate and regular rhythm.     Pulses: Normal pulses.     Heart sounds: Normal heart sounds.  Pulmonary:     Effort: Pulmonary effort is normal. No respiratory  distress.     Breath sounds: Normal breath sounds.  Abdominal:     General: Bowel sounds are normal. There is no distension.     Palpations: Abdomen is soft.     Tenderness: There is  no abdominal tenderness.     Comments:  Chole drain    Musculoskeletal:        General: Normal range of motion.     Cervical back: Neck supple.     Right lower leg: No edema.     Left lower leg: No edema.  Lymphadenopathy:     Cervical: No cervical adenopathy.  Skin:    General: Skin is warm and dry.  Neurological:     Mental Status: She is alert and oriented to person, place, and time.  Psychiatric:        Mood and Affect: Mood normal.     Assessment/Plan:     Patient is being discharged with the following home health services:  pt/ot to evaluate and treat as indicated for gait balance strength adl training.   Patient is being discharged with the following durable medical equipment:  none needed   Patient has been advised to f/u with their PCP in 1-2 weeks to for a transitions of care visit.  Social services at their facility was responsible for arranging this appointment.  Pt was provided with adequate prescriptions of noncontrolled medications to reach the scheduled appointment .  For controlled substances, a limited supply was provided as appropriate for the individual patient.  If the pt normally receives these medications from a pain clinic or has a contract with another physician, these medications should be received from that clinic or physician only).     A 30 day supply of her prescription of her prescription medications have been sent to [facility] on [address]   Time spent with patient: 35 minutes: for therapy; medications; dme.   Ok Edwards NP [facility]  call [phone]

## 2022-02-18 ENCOUNTER — Encounter: Payer: Self-pay | Admitting: Adult Health

## 2022-02-18 ENCOUNTER — Other Ambulatory Visit: Payer: Self-pay | Admitting: Adult Health

## 2022-02-18 LAB — CUP PACEART REMOTE DEVICE CHECK
Battery Remaining Longevity: 55 mo
Battery Remaining Percentage: 46 %
Battery Voltage: 2.98 V
Brady Statistic AP VP Percent: 17 %
Brady Statistic AP VS Percent: 18 %
Brady Statistic AS VP Percent: 31 %
Brady Statistic AS VS Percent: 33 %
Brady Statistic RA Percent Paced: 34 %
Brady Statistic RV Percent Paced: 48 %
Date Time Interrogation Session: 20230223180810
Implantable Lead Implant Date: 20161129
Implantable Lead Implant Date: 20161129
Implantable Lead Location: 753859
Implantable Lead Location: 753860
Implantable Lead Model: 5076
Implantable Pulse Generator Implant Date: 20161129
Lead Channel Impedance Value: 390 Ohm
Lead Channel Impedance Value: 490 Ohm
Lead Channel Pacing Threshold Amplitude: 0.75 V
Lead Channel Pacing Threshold Amplitude: 0.75 V
Lead Channel Pacing Threshold Pulse Width: 0.4 ms
Lead Channel Pacing Threshold Pulse Width: 0.4 ms
Lead Channel Sensing Intrinsic Amplitude: 11.8 mV
Lead Channel Sensing Intrinsic Amplitude: 4.4 mV
Lead Channel Setting Pacing Amplitude: 1.75 V
Lead Channel Setting Pacing Amplitude: 2 V
Lead Channel Setting Pacing Pulse Width: 0.4 ms
Lead Channel Setting Sensing Sensitivity: 2 mV
Pulse Gen Model: 2240
Pulse Gen Serial Number: 7826037

## 2022-02-18 NOTE — Progress Notes (Signed)
This encounter was created in error - please disregard.

## 2022-02-18 NOTE — Progress Notes (Signed)
Location:  [facility] Room Number: 132-P Place of Service:  SNF (31)   CODE STATUS: Full Code  Allergies  Allergen Reactions   Penicillins Hives, Itching and Other (See Comments)    Has tolerated ceftriaxone 7/22 Has patient had a PCN reaction causing immediate rash, facial/tongue/throat swelling, SOB or lightheadedness with hypotension: No Has patient had a PCN reaction causing severe rash involving mucus membranes or skin necrosis: Yes Has patient had a PCN reaction that required hospitalization No Has patient had a PCN reaction occurring within the last 10 years: No If all of the above answers are "NO", then may proceed with Cephalosporin use.     Chief Complaint  Patient presents with   Discharge Note    HPI:    Past Medical History:  Diagnosis Date   Acid reflux    Arthritis    Colon adenomas    AGE 81   Complete heart block (HCC)    STJ PPM Dr. 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, long term anticoag with apixaban    Hyperlipidemia    Myoview 03/2021    Myoview 4/22: EF 50, no infarct or ischemia; low risk   Presence of permanent cardiac pacemaker    Type 2 diabetes mellitus ([place])     Past Surgical History:  Procedure Laterality Date   COLONOSCOPY     3 SIMPLE ADENOMAS, AGE 42   COLONOSCOPY N/A 05/24/2018   Procedure: COLONOSCOPY;  Surgeon: Danie Binder, MD;  [facility];  Service: Endoscopy;  Laterality: N/A;  1:00pm   EP IMPLANTABLE DEVICE N/A 11/24/2015   Procedure: Pacemaker Implant;  Surgeon: [name], MD;  Location: [facility];  Service: Cardiovascular;  Laterality: N/A;   IR EXCHANGE BILIARY DRAIN  07/27/2021   IR EXCHANGE BILIARY DRAIN  08/23/2021   IR PERC CHOLECYSTOSTOMY  07/08/2021   IR PERC CHOLECYSTOSTOMY  11/04/2021   IR RADIOLOGIST EVAL & MGMT  09/22/2021   JOINT REPLACEMENT     knees bilat.   LEFT HEART CATH AND CORONARY ANGIOGRAPHY N/A  07/02/2021   Procedure: LEFT HEART CATH AND CORONARY ANGIOGRAPHY;  Surgeon: Jettie Booze, MD;  Location: [facility];  Service: Cardiovascular;  Laterality: N/A;   POLYPECTOMY  05/24/2018   Procedure: POLYPECTOMY;  Surgeon: Danie Binder, MD;  [facility];  Service: Endoscopy;;  ascending and hepatic flexure, transverse   TEE WITHOUT CARDIOVERSION N/A 08/27/2021   Procedure: TRANSESOPHAGEAL ECHOCARDIOGRAM (TEE);  Surgeon: Pixie Casino, MD;  Location: [facility] ENDOSCOPY;  Service: Cardiovascular;  Laterality: N/A;   TUBAL LIGATION      Social History   Socioeconomic History   Marital status: Widowed    Spouse name: Not on file   Number of children: Not on file   Years of education: Not on file   Highest education level: Not on file  Occupational History   Occupation: retired  Tobacco Use   Smoking status: Never   Smokeless tobacco: Never  Vaping Use   Vaping Use: Never used  Substance and Sexual Activity   Alcohol use: No    Alcohol/week: 0.0 standard drinks   Drug use: No   Sexual activity: Yes    Birth control/protection: Post-menopausal  Other Topics Concern   Not on file  Social History Narrative   MARRIED FOR 23 YRS. 5 KIDS: #4 PRESENT TODAY(AGE 41)   Social Determinants of Health  Financial Resource Strain: Not on file  Food Insecurity: Not on file  Transportation Needs: Not on file  Physical Activity: Not on file  Stress: Not on file  Social Connections: Not on file  Intimate Partner Violence: Not on file   Family History  Problem Relation Age of Onset   Heart failure Mother    Hypertension Mother    Heart failure Sister    Hypertension Father    Cancer Sister    Dementia Sister    Colon cancer Neg Hx    Colon polyps Neg Hx       VITAL SIGNS BP (!) 150/74    Pulse 78    Temp (!) 97.3 F (36.3 C)    Resp 20    Ht 5\' 4"  (1.626 m)    Wt 199 lb (90.3 kg)    SpO2 96%    BMI 34.16 kg/m   Outpatient Encounter Medications as of  02/18/2022  Medication Sig   apixaban (ELIQUIS) 5 MG TABS tablet Take 1 tablet (5 mg total) by mouth 2 (two) times daily.   atorvastatin (LIPITOR) 40 MG tablet Take 1 tablet (40 mg total) by mouth daily.   cyanocobalamin (,VITAMIN B-12,) 1000 MCG/ML injection Inject 1 mL (1,000 mcg total) into the muscle once a week for 28 days.   furosemide (LASIX) 40 MG tablet Take 1 tablet (40 mg total) by mouth daily.   glucose blood test strip Use to check blood glucose fasting , before lunch and dinner and after your largest meal   Insulin Pen Needle (BD AUTOSHIELD DUO) 30G X 5 MM MISC 30 gauge x 3/16" (pen needle,diabetic dual safty)   Lancets (ONETOUCH ULTRASOFT) lancets Use to check glucose 4 x daily   LANTUS SOLOSTAR 100 UNIT/ML Solostar Pen Inject 10 Units into the skin at bedtime.   metFORMIN (GLUCOPHAGE) 1000 MG tablet TAKE (1) TABLET BY MOUTH TWICE DAILY.   metoprolol succinate (TOPROL-XL) 50 MG 24 hr tablet Take 1 tablet (50 mg total) by mouth daily. Take with or immediately following a meal.   nitroGLYCERIN (NITROSTAT) 0.4 MG SL tablet Place 1 tablet (0.4 mg total) under the tongue every 5 (five) minutes as needed for chest pain.   polyethylene glycol (MIRALAX / GLYCOLAX) 17 g packet Take 17 g by mouth daily as needed for mild constipation.   potassium chloride (KLOR-CON) 10 MEQ tablet Take 1 tablet (10 mEq total) by mouth daily.   [DISCONTINUED] acetaminophen (TYLENOL) 500 MG tablet Take 1,200 mg by mouth every 6 (six) hours as needed.   No facility-administered encounter medications on file as of 02/18/2022.     SIGNIFICANT DIAGNOSTIC EXAMS       ASSESSMENT/ PLAN:     Ok Edwards NP [facility]  Contact [phone] Monday through Friday 8am- 5pm  After hours call [phone]

## 2022-02-21 DIAGNOSIS — E539 Vitamin B deficiency, unspecified: Secondary | ICD-10-CM | POA: Diagnosis not present

## 2022-02-21 DIAGNOSIS — Z794 Long term (current) use of insulin: Secondary | ICD-10-CM | POA: Diagnosis not present

## 2022-02-21 DIAGNOSIS — I10 Essential (primary) hypertension: Secondary | ICD-10-CM | POA: Diagnosis not present

## 2022-02-21 DIAGNOSIS — K819 Cholecystitis, unspecified: Secondary | ICD-10-CM | POA: Diagnosis not present

## 2022-02-22 DIAGNOSIS — I5032 Chronic diastolic (congestive) heart failure: Secondary | ICD-10-CM | POA: Diagnosis not present

## 2022-02-22 DIAGNOSIS — A0811 Acute gastroenteropathy due to Norwalk agent: Secondary | ICD-10-CM | POA: Diagnosis not present

## 2022-02-22 DIAGNOSIS — R7881 Bacteremia: Secondary | ICD-10-CM | POA: Diagnosis not present

## 2022-02-22 DIAGNOSIS — I110 Hypertensive heart disease with heart failure: Secondary | ICD-10-CM | POA: Diagnosis not present

## 2022-02-22 DIAGNOSIS — N179 Acute kidney failure, unspecified: Secondary | ICD-10-CM | POA: Diagnosis not present

## 2022-02-22 DIAGNOSIS — K812 Acute cholecystitis with chronic cholecystitis: Secondary | ICD-10-CM | POA: Diagnosis not present

## 2022-02-22 DIAGNOSIS — E872 Acidosis, unspecified: Secondary | ICD-10-CM | POA: Diagnosis not present

## 2022-02-22 DIAGNOSIS — B9620 Unspecified Escherichia coli [E. coli] as the cause of diseases classified elsewhere: Secondary | ICD-10-CM | POA: Diagnosis not present

## 2022-02-22 DIAGNOSIS — R7401 Elevation of levels of liver transaminase levels: Secondary | ICD-10-CM | POA: Diagnosis not present

## 2022-02-22 NOTE — Progress Notes (Signed)
Remote pacemaker transmission.   

## 2022-02-23 ENCOUNTER — Telehealth: Payer: Self-pay | Admitting: Internal Medicine

## 2022-02-23 NOTE — Telephone Encounter (Signed)
Pt c/o medication issue: ? ?1. Name of Medication:  ? ?2. How are you currently taking this medication (dosage and times per day)?  ? ?3. Are you having a reaction (difficulty breathing--STAT)?  ? ?4. What is your medication issue?  ? ? ?Suezanne Jacquet, PT with [name], states physical therapy was ordered after patient was discharged from [facility]. Therapy recommended 2 weeks 3 and then 1 week 3. Suezanne Jacquet also wanted to discuss medication. States on discharge papers, patient was advised to take Vitamin B12 injection and Potassium, but patient hasn't been taking. A call may be returned to Arabi, PT to discuss at [phone]. ?

## 2022-02-23 NOTE — Telephone Encounter (Signed)
Returned call to PT. ? ?Per PT-Pt is not taking her potassium because "Dr. Lovena Le told her not to".  Advised this was not the case. ? ?PT indicates understanding. ?

## 2022-02-24 ENCOUNTER — Encounter: Payer: Self-pay | Admitting: General Surgery

## 2022-02-24 ENCOUNTER — Ambulatory Visit: Payer: Medicare HMO | Admitting: General Surgery

## 2022-02-24 VITALS — BP 138/78 | HR 70 | Temp 97.8°F | Resp 16 | Ht 64.0 in | Wt 191.0 lb

## 2022-02-24 DIAGNOSIS — T85518D Breakdown (mechanical) of other gastrointestinal prosthetic devices, implants and grafts, subsequent encounter: Secondary | ICD-10-CM | POA: Diagnosis not present

## 2022-02-24 NOTE — Progress Notes (Signed)
Subjective:  ?  ? Natalie Castro  ?Patient here for follow-up after hospitalization for sepsis.  I was asked to see the patient as she has had a cholecystostomy tube in place.  It was working well while in the hospital.  She was recently discharged from skilled nursing and the family reports that they are unable to flush the catheter and she is having significant leakage around the cholecystostomy tube as well as in the tubing itself.  She denies any fever or chills.  She denies any jaundice. ?Objective:  ? ? BP 138/78   Pulse 70   Temp 97.8 ?F (36.6 ?C) (Other (Comment))   Resp 16   Ht 5\' 4"  (1.626 m)   Wt 191 lb (86.6 kg)   SpO2 97%   BMI 32.79 kg/m?  ? ?General:  alert, cooperative, and no distress  ?Abdomen is soft.  There is a significant amount of bandaging around the exit site as well as on the tubing itself.  I did remove the dressing at the exit site and no bile was present.  I did redress this. ?   ? ?Assessment:  ? ? Dysfunctional cholecystostomy tube  ?  ?Plan:  ? ?We have contacted interventional radiology and they will assess and possibly change the tube tomorrow.  No evidence of biliary infection at this standpoint.  Follow-up here as needed. ?

## 2022-02-25 ENCOUNTER — Ambulatory Visit (HOSPITAL_COMMUNITY)
Admission: RE | Admit: 2022-02-25 | Discharge: 2022-02-25 | Disposition: A | Discharge status: Home / Self Care | Payer: Medicare HMO | Source: Ambulatory Visit | Attending: General Surgery | Admitting: General Surgery

## 2022-02-25 DIAGNOSIS — Z434 Encounter for attention to other artificial openings of digestive tract: Secondary | ICD-10-CM | POA: Insufficient documentation

## 2022-02-25 DIAGNOSIS — K819 Cholecystitis, unspecified: Secondary | ICD-10-CM | POA: Diagnosis not present

## 2022-02-25 DIAGNOSIS — T85590A Other mechanical complication of bile duct prosthesis, initial encounter: Secondary | ICD-10-CM | POA: Diagnosis not present

## 2022-02-25 HISTORY — PX: IR EXCHANGE BILIARY DRAIN: IMG6046

## 2022-02-25 MED ORDER — LIDOCAINE HCL (PF) 1 % IJ SOLN
INTRAMUSCULAR | Status: AC
  Filled 2022-02-25: qty 30

## 2022-03-01 ENCOUNTER — Other Ambulatory Visit (HOSPITAL_COMMUNITY)
Admission: RE | Admit: 2022-03-01 | Discharge: 2022-03-01 | Disposition: A | Discharge status: Home / Self Care | Payer: Medicare HMO | Source: Ambulatory Visit | Attending: Internal Medicine | Admitting: Internal Medicine

## 2022-03-01 ENCOUNTER — Telehealth: Payer: Self-pay

## 2022-03-01 DIAGNOSIS — I5032 Chronic diastolic (congestive) heart failure: Secondary | ICD-10-CM

## 2022-03-01 DIAGNOSIS — E875 Hyperkalemia: Secondary | ICD-10-CM

## 2022-03-01 LAB — BASIC METABOLIC PANEL
Anion gap: 9 (ref 5–15)
BUN: 15 mg/dL (ref 8–23)
CO2: 27 mmol/L (ref 22–32)
Calcium: 8.8 mg/dL — ABNORMAL LOW (ref 8.9–10.3)
Chloride: 101 mmol/L (ref 98–111)
Creatinine, Ser: 0.94 mg/dL (ref 0.44–1.00)
GFR, Estimated: >60 mL/min (ref >60)
Glucose, Bld: 157 mg/dL — ABNORMAL HIGH (ref 70–99)
Potassium: 4.3 mmol/L (ref 3.5–5.1)
Sodium: 137 mmol/L (ref 135–145)

## 2022-03-01 NOTE — Telephone Encounter (Signed)
Outreach made to Pt. ? ?Pt states she has been doing well. ? ?Pt had elevated potassium upon discharge from nursing home.  She does not have a PCP follow up scheduled until May. ? ?Pt historically has had a low potassium.  Has been taking potassium 10 meq daily until advised to hold. ? ?Will recheck BMP at [facility] lab to confirm stable potassium. ? ?Pt aware and in agreement. ?

## 2022-03-01 NOTE — Telephone Encounter (Signed)
-----   Message from Evans Lance, MD sent at 02/20/2022  2:34 PM EST ----- ?Hold potassium ?

## 2022-03-02 NOTE — Telephone Encounter (Signed)
Lab results received. ? ?Reviewed by Dr. Lovena Le. ? ?Per Dr. Leona Singleton potassium. ? ?Medication removed from Pt's med list. ? ?Outreach made to Pt.  Aware to discontinue potassium. ?

## 2022-03-12 DIAGNOSIS — N6310 Unspecified lump in the right breast, unspecified quadrant: Secondary | ICD-10-CM | POA: Diagnosis not present

## 2022-03-12 DIAGNOSIS — I272 Pulmonary hypertension, unspecified: Secondary | ICD-10-CM | POA: Diagnosis present

## 2022-03-12 DIAGNOSIS — Z86711 Personal history of pulmonary embolism: Secondary | ICD-10-CM

## 2022-03-12 DIAGNOSIS — E860 Dehydration: Secondary | ICD-10-CM | POA: Diagnosis present

## 2022-03-12 DIAGNOSIS — K8050 Calculus of bile duct without cholangitis or cholecystitis without obstruction: Secondary | ICD-10-CM | POA: Diagnosis not present

## 2022-03-12 DIAGNOSIS — I10 Essential (primary) hypertension: Secondary | ICD-10-CM | POA: Diagnosis not present

## 2022-03-12 DIAGNOSIS — I2699 Other pulmonary embolism without acute cor pulmonale: Secondary | ICD-10-CM | POA: Diagnosis not present

## 2022-03-12 DIAGNOSIS — Z809 Family history of malignant neoplasm, unspecified: Secondary | ICD-10-CM

## 2022-03-12 DIAGNOSIS — B9620 Unspecified Escherichia coli [E. coli] as the cause of diseases classified elsewhere: Secondary | ICD-10-CM | POA: Diagnosis not present

## 2022-03-12 DIAGNOSIS — I2510 Atherosclerotic heart disease of native coronary artery without angina pectoris: Secondary | ICD-10-CM | POA: Diagnosis present

## 2022-03-12 DIAGNOSIS — N179 Acute kidney failure, unspecified: Secondary | ICD-10-CM | POA: Diagnosis present

## 2022-03-12 DIAGNOSIS — Z7901 Long term (current) use of anticoagulants: Secondary | ICD-10-CM

## 2022-03-12 DIAGNOSIS — R7401 Elevation of levels of liver transaminase levels: Secondary | ICD-10-CM | POA: Diagnosis not present

## 2022-03-12 DIAGNOSIS — Z8249 Family history of ischemic heart disease and other diseases of the circulatory system: Secondary | ICD-10-CM

## 2022-03-12 DIAGNOSIS — D649 Anemia, unspecified: Secondary | ICD-10-CM | POA: Diagnosis not present

## 2022-03-12 DIAGNOSIS — R7881 Bacteremia: Secondary | ICD-10-CM | POA: Diagnosis not present

## 2022-03-12 DIAGNOSIS — R6881 Early satiety: Secondary | ICD-10-CM | POA: Diagnosis not present

## 2022-03-12 DIAGNOSIS — I509 Heart failure, unspecified: Secondary | ICD-10-CM | POA: Diagnosis not present

## 2022-03-12 DIAGNOSIS — K851 Biliary acute pancreatitis without necrosis or infection: Secondary | ICD-10-CM | POA: Diagnosis present

## 2022-03-12 DIAGNOSIS — E119 Type 2 diabetes mellitus without complications: Secondary | ICD-10-CM | POA: Diagnosis present

## 2022-03-12 DIAGNOSIS — E876 Hypokalemia: Secondary | ICD-10-CM | POA: Diagnosis not present

## 2022-03-12 DIAGNOSIS — K769 Liver disease, unspecified: Secondary | ICD-10-CM | POA: Diagnosis present

## 2022-03-12 DIAGNOSIS — A4151 Sepsis due to Escherichia coli [E. coli]: Principal | ICD-10-CM | POA: Diagnosis present

## 2022-03-12 DIAGNOSIS — Z950 Presence of cardiac pacemaker: Secondary | ICD-10-CM

## 2022-03-12 DIAGNOSIS — E538 Deficiency of other specified B group vitamins: Secondary | ICD-10-CM | POA: Diagnosis present

## 2022-03-12 DIAGNOSIS — Z6830 Body mass index (BMI) 30.0-30.9, adult: Secondary | ICD-10-CM

## 2022-03-12 DIAGNOSIS — E669 Obesity, unspecified: Secondary | ICD-10-CM | POA: Diagnosis present

## 2022-03-12 DIAGNOSIS — Z79899 Other long term (current) drug therapy: Secondary | ICD-10-CM | POA: Diagnosis not present

## 2022-03-12 DIAGNOSIS — K812 Acute cholecystitis with chronic cholecystitis: Secondary | ICD-10-CM | POA: Diagnosis present

## 2022-03-12 DIAGNOSIS — I442 Atrioventricular block, complete: Secondary | ICD-10-CM | POA: Diagnosis present

## 2022-03-12 DIAGNOSIS — Z833 Family history of diabetes mellitus: Secondary | ICD-10-CM | POA: Diagnosis not present

## 2022-03-12 DIAGNOSIS — Z880 Allergy status to penicillin: Secondary | ICD-10-CM | POA: Diagnosis not present

## 2022-03-12 DIAGNOSIS — R7989 Other specified abnormal findings of blood chemistry: Secondary | ICD-10-CM | POA: Diagnosis present

## 2022-03-12 DIAGNOSIS — Z9689 Presence of other specified functional implants: Secondary | ICD-10-CM | POA: Diagnosis present

## 2022-03-12 DIAGNOSIS — K838 Other specified diseases of biliary tract: Secondary | ICD-10-CM | POA: Diagnosis not present

## 2022-03-12 DIAGNOSIS — E782 Mixed hyperlipidemia: Secondary | ICD-10-CM | POA: Diagnosis present

## 2022-03-12 DIAGNOSIS — K859 Acute pancreatitis without necrosis or infection, unspecified: Secondary | ICD-10-CM | POA: Diagnosis not present

## 2022-03-12 DIAGNOSIS — R509 Fever, unspecified: Secondary | ICD-10-CM | POA: Diagnosis not present

## 2022-03-12 DIAGNOSIS — I110 Hypertensive heart disease with heart failure: Secondary | ICD-10-CM | POA: Diagnosis present

## 2022-03-12 DIAGNOSIS — R932 Abnormal findings on diagnostic imaging of liver and biliary tract: Secondary | ICD-10-CM | POA: Diagnosis not present

## 2022-03-12 DIAGNOSIS — I5032 Chronic diastolic (congestive) heart failure: Secondary | ICD-10-CM | POA: Diagnosis present

## 2022-03-12 DIAGNOSIS — Z01810 Encounter for preprocedural cardiovascular examination: Secondary | ICD-10-CM | POA: Diagnosis not present

## 2022-03-12 DIAGNOSIS — K8046 Calculus of bile duct with acute and chronic cholecystitis without obstruction: Secondary | ICD-10-CM | POA: Diagnosis not present

## 2022-03-12 DIAGNOSIS — R100 Acute abdomen: Secondary | ICD-10-CM | POA: Diagnosis not present

## 2022-03-12 DIAGNOSIS — Z7984 Long term (current) use of oral hypoglycemic drugs: Secondary | ICD-10-CM

## 2022-03-12 DIAGNOSIS — K8044 Calculus of bile duct with chronic cholecystitis without obstruction: Secondary | ICD-10-CM | POA: Diagnosis not present

## 2022-03-12 DIAGNOSIS — K811 Chronic cholecystitis: Secondary | ICD-10-CM | POA: Diagnosis not present

## 2022-03-12 DIAGNOSIS — R1110 Vomiting, unspecified: Secondary | ICD-10-CM | POA: Diagnosis not present

## 2022-03-12 DIAGNOSIS — M1990 Unspecified osteoarthritis, unspecified site: Secondary | ICD-10-CM | POA: Diagnosis not present

## 2022-03-12 DIAGNOSIS — Z794 Long term (current) use of insulin: Secondary | ICD-10-CM

## 2022-03-13 ENCOUNTER — Emergency Department (HOSPITAL_COMMUNITY): Payer: Medicare HMO

## 2022-03-13 ENCOUNTER — Inpatient Hospital Stay (HOSPITAL_COMMUNITY)
Admission: EM | Admit: 2022-03-13 | Discharge: 2022-03-18 | DRG: 853 | Disposition: A | Discharge status: Home Health | Payer: Medicare HMO | Attending: Internal Medicine | Admitting: Internal Medicine

## 2022-03-13 ENCOUNTER — Encounter (HOSPITAL_COMMUNITY): Payer: Self-pay

## 2022-03-13 ENCOUNTER — Other Ambulatory Visit: Payer: Self-pay

## 2022-03-13 DIAGNOSIS — E1122 Type 2 diabetes mellitus with diabetic chronic kidney disease: Secondary | ICD-10-CM | POA: Diagnosis present

## 2022-03-13 DIAGNOSIS — E538 Deficiency of other specified B group vitamins: Secondary | ICD-10-CM | POA: Diagnosis present

## 2022-03-13 DIAGNOSIS — I509 Heart failure, unspecified: Secondary | ICD-10-CM | POA: Diagnosis not present

## 2022-03-13 DIAGNOSIS — Z9689 Presence of other specified functional implants: Secondary | ICD-10-CM | POA: Diagnosis present

## 2022-03-13 DIAGNOSIS — D649 Anemia, unspecified: Secondary | ICD-10-CM | POA: Diagnosis not present

## 2022-03-13 DIAGNOSIS — K859 Acute pancreatitis without necrosis or infection, unspecified: Secondary | ICD-10-CM

## 2022-03-13 DIAGNOSIS — R7989 Other specified abnormal findings of blood chemistry: Secondary | ICD-10-CM | POA: Diagnosis present

## 2022-03-13 DIAGNOSIS — K811 Chronic cholecystitis: Secondary | ICD-10-CM | POA: Diagnosis present

## 2022-03-13 DIAGNOSIS — E669 Obesity, unspecified: Secondary | ICD-10-CM | POA: Diagnosis present

## 2022-03-13 DIAGNOSIS — Z880 Allergy status to penicillin: Secondary | ICD-10-CM | POA: Diagnosis not present

## 2022-03-13 DIAGNOSIS — E860 Dehydration: Secondary | ICD-10-CM

## 2022-03-13 DIAGNOSIS — N6310 Unspecified lump in the right breast, unspecified quadrant: Secondary | ICD-10-CM

## 2022-03-13 DIAGNOSIS — R7881 Bacteremia: Secondary | ICD-10-CM | POA: Diagnosis not present

## 2022-03-13 DIAGNOSIS — R7401 Elevation of levels of liver transaminase levels: Secondary | ICD-10-CM | POA: Diagnosis not present

## 2022-03-13 DIAGNOSIS — K812 Acute cholecystitis with chronic cholecystitis: Secondary | ICD-10-CM | POA: Diagnosis present

## 2022-03-13 DIAGNOSIS — Z6830 Body mass index (BMI) 30.0-30.9, adult: Secondary | ICD-10-CM | POA: Diagnosis not present

## 2022-03-13 DIAGNOSIS — I5032 Chronic diastolic (congestive) heart failure: Secondary | ICD-10-CM | POA: Diagnosis present

## 2022-03-13 DIAGNOSIS — Z01810 Encounter for preprocedural cardiovascular examination: Secondary | ICD-10-CM | POA: Diagnosis not present

## 2022-03-13 DIAGNOSIS — K838 Other specified diseases of biliary tract: Secondary | ICD-10-CM | POA: Diagnosis not present

## 2022-03-13 DIAGNOSIS — Z833 Family history of diabetes mellitus: Secondary | ICD-10-CM | POA: Diagnosis not present

## 2022-03-13 DIAGNOSIS — K8050 Calculus of bile duct without cholangitis or cholecystitis without obstruction: Secondary | ICD-10-CM | POA: Diagnosis not present

## 2022-03-13 DIAGNOSIS — I110 Hypertensive heart disease with heart failure: Secondary | ICD-10-CM | POA: Diagnosis present

## 2022-03-13 DIAGNOSIS — A4151 Sepsis due to Escherichia coli [E. coli]: Secondary | ICD-10-CM | POA: Diagnosis present

## 2022-03-13 DIAGNOSIS — I10 Essential (primary) hypertension: Secondary | ICD-10-CM | POA: Diagnosis not present

## 2022-03-13 DIAGNOSIS — E119 Type 2 diabetes mellitus without complications: Secondary | ICD-10-CM

## 2022-03-13 DIAGNOSIS — Z950 Presence of cardiac pacemaker: Secondary | ICD-10-CM | POA: Diagnosis not present

## 2022-03-13 DIAGNOSIS — R932 Abnormal findings on diagnostic imaging of liver and biliary tract: Secondary | ICD-10-CM | POA: Diagnosis not present

## 2022-03-13 DIAGNOSIS — I272 Pulmonary hypertension, unspecified: Secondary | ICD-10-CM | POA: Diagnosis present

## 2022-03-13 DIAGNOSIS — E876 Hypokalemia: Secondary | ICD-10-CM | POA: Diagnosis not present

## 2022-03-13 DIAGNOSIS — A419 Sepsis, unspecified organism: Secondary | ICD-10-CM | POA: Diagnosis present

## 2022-03-13 DIAGNOSIS — B9620 Unspecified Escherichia coli [E. coli] as the cause of diseases classified elsewhere: Secondary | ICD-10-CM | POA: Diagnosis not present

## 2022-03-13 DIAGNOSIS — E782 Mixed hyperlipidemia: Secondary | ICD-10-CM | POA: Diagnosis present

## 2022-03-13 DIAGNOSIS — K851 Biliary acute pancreatitis without necrosis or infection: Secondary | ICD-10-CM | POA: Diagnosis present

## 2022-03-13 DIAGNOSIS — K8689 Other specified diseases of pancreas: Secondary | ICD-10-CM | POA: Diagnosis present

## 2022-03-13 DIAGNOSIS — R509 Fever, unspecified: Principal | ICD-10-CM

## 2022-03-13 DIAGNOSIS — Z7901 Long term (current) use of anticoagulants: Secondary | ICD-10-CM | POA: Diagnosis not present

## 2022-03-13 DIAGNOSIS — Z79899 Other long term (current) drug therapy: Secondary | ICD-10-CM | POA: Diagnosis not present

## 2022-03-13 DIAGNOSIS — Z86711 Personal history of pulmonary embolism: Secondary | ICD-10-CM | POA: Diagnosis not present

## 2022-03-13 DIAGNOSIS — N179 Acute kidney failure, unspecified: Secondary | ICD-10-CM | POA: Diagnosis present

## 2022-03-13 DIAGNOSIS — I2510 Atherosclerotic heart disease of native coronary artery without angina pectoris: Secondary | ICD-10-CM | POA: Diagnosis present

## 2022-03-13 DIAGNOSIS — M1990 Unspecified osteoarthritis, unspecified site: Secondary | ICD-10-CM | POA: Diagnosis not present

## 2022-03-13 DIAGNOSIS — Z8249 Family history of ischemic heart disease and other diseases of the circulatory system: Secondary | ICD-10-CM | POA: Diagnosis not present

## 2022-03-13 DIAGNOSIS — I442 Atrioventricular block, complete: Secondary | ICD-10-CM | POA: Diagnosis present

## 2022-03-13 DIAGNOSIS — K8044 Calculus of bile duct with chronic cholecystitis without obstruction: Secondary | ICD-10-CM | POA: Diagnosis not present

## 2022-03-13 DIAGNOSIS — K769 Liver disease, unspecified: Secondary | ICD-10-CM | POA: Diagnosis present

## 2022-03-13 LAB — BLOOD CULTURE ID PANEL (REFLEXED) - BCID2

## 2022-03-13 LAB — COMPREHENSIVE METABOLIC PANEL
ALT: 55 U/L — ABNORMAL HIGH (ref 0–44)
AST: 138 U/L — ABNORMAL HIGH (ref 15–41)
Albumin: 2.7 g/dL — ABNORMAL LOW (ref 3.5–5.0)
Alkaline Phosphatase: 154 U/L — ABNORMAL HIGH (ref 38–126)
Anion gap: 12 (ref 5–15)
BUN: 17 mg/dL (ref 8–23)
CO2: 27 mmol/L (ref 22–32)
Calcium: 8.6 mg/dL — ABNORMAL LOW (ref 8.9–10.3)
Chloride: 98 mmol/L (ref 98–111)
Creatinine, Ser: 1.09 mg/dL — ABNORMAL HIGH (ref 0.44–1.00)
GFR, Estimated: 51 mL/min — ABNORMAL LOW (ref >60)
Glucose, Bld: 225 mg/dL — ABNORMAL HIGH (ref 70–99)
Potassium: 3.7 mmol/L (ref 3.5–5.1)
Sodium: 137 mmol/L (ref 135–145)
Total Bilirubin: 1.6 mg/dL — ABNORMAL HIGH (ref 0.3–1.2)
Total Protein: 7.7 g/dL (ref 6.5–8.1)

## 2022-03-13 LAB — CBC WITH DIFFERENTIAL/PLATELET
Abs Immature Granulocytes: 0.1 10*3/uL — ABNORMAL HIGH (ref 0.00–0.07)
Basophils Absolute: 0 10*3/uL (ref 0.0–0.1)
Basophils Relative: 0 %
Eosinophils Absolute: 0 10*3/uL (ref 0.0–0.5)
Eosinophils Relative: 0 %
HCT: 36.2 % (ref 36.0–46.0)
Hemoglobin: 11.1 g/dL — ABNORMAL LOW (ref 12.0–15.0)
Immature Granulocytes: 1 %
Lymphocytes Relative: 8 %
Lymphs Abs: 0.9 10*3/uL (ref 0.7–4.0)
MCH: 28.9 pg (ref 26.0–34.0)
MCHC: 30.7 g/dL (ref 30.0–36.0)
MCV: 94.3 fL (ref 80.0–100.0)
Monocytes Absolute: 0.7 10*3/uL (ref 0.1–1.0)
Monocytes Relative: 6 %
Neutro Abs: 9.6 10*3/uL — ABNORMAL HIGH (ref 1.7–7.7)
Neutrophils Relative %: 85 %
Platelets: 188 10*3/uL (ref 150–400)
RBC: 3.84 MIL/uL — ABNORMAL LOW (ref 3.87–5.11)
RDW: 13.4 % (ref 11.5–15.5)
WBC: 11.3 10*3/uL — ABNORMAL HIGH (ref 4.0–10.5)
nRBC: 0 % (ref 0.0–0.2)

## 2022-03-13 LAB — URINALYSIS, ROUTINE W REFLEX MICROSCOPIC
Bacteria, UA: NONE SEEN
Bilirubin Urine: NEGATIVE
Glucose, UA: NEGATIVE mg/dL
Ketones, ur: 5 mg/dL — AB
Leukocytes,Ua: NEGATIVE
Nitrite: NEGATIVE
Protein, ur: 30 mg/dL — AB
Specific Gravity, Urine: 1.04 — ABNORMAL HIGH (ref 1.005–1.030)
pH: 5 (ref 5.0–8.0)

## 2022-03-13 LAB — LIPID PANEL
Cholesterol: 62 mg/dL (ref 0–200)
HDL: 28 mg/dL — ABNORMAL LOW (ref >40)
LDL Cholesterol: 28 mg/dL (ref 0–99)
Total CHOL/HDL Ratio: 2.2 RATIO
Triglycerides: 30 mg/dL (ref <150)
VLDL: 6 mg/dL (ref 0–40)

## 2022-03-13 LAB — GLUCOSE, CAPILLARY
Glucose-Capillary: 150 mg/dL — ABNORMAL HIGH (ref 70–99)
Glucose-Capillary: 150 mg/dL — ABNORMAL HIGH (ref 70–99)

## 2022-03-13 LAB — PROTIME-INR
INR: 1.7 — ABNORMAL HIGH (ref 0.8–1.2)
Prothrombin Time: 19.6 seconds — ABNORMAL HIGH (ref 11.4–15.2)

## 2022-03-13 LAB — CBG MONITORING, ED
Glucose-Capillary: 118 mg/dL — ABNORMAL HIGH (ref 70–99)
Glucose-Capillary: 100 mg/dL — ABNORMAL HIGH (ref 70–99)

## 2022-03-13 LAB — LACTIC ACID, PLASMA
Lactic Acid, Venous: 1.9 mmol/L (ref 0.5–1.9)
Lactic Acid, Venous: 1.2 mmol/L (ref 0.5–1.9)

## 2022-03-13 LAB — APTT: aPTT: 68 seconds — ABNORMAL HIGH (ref 24–36)

## 2022-03-13 LAB — LIPASE, BLOOD: Lipase: 9034 U/L — ABNORMAL HIGH (ref 11–51)

## 2022-03-13 MED ORDER — HEPARIN (PORCINE) 25000 UT/250ML-% IV SOLN
1100.0000 [IU]/h | INTRAVENOUS | Status: AC
  Administered 2022-03-13: 1150 [IU]/h via INTRAVENOUS
  Administered 2022-03-14: 1100 [IU]/h via INTRAVENOUS
  Filled 2022-03-13 (×2): qty 250

## 2022-03-13 MED ORDER — METRONIDAZOLE 500 MG/100ML IV SOLN
500.0000 mg | Freq: Two times a day (BID) | INTRAVENOUS | Status: DC
  Administered 2022-03-13 – 2022-03-17 (×8): 500 mg via INTRAVENOUS
  Filled 2022-03-13 (×8): qty 100

## 2022-03-13 MED ORDER — HYDROMORPHONE HCL 1 MG/ML IJ SOLN: 0.5000 mg | INTRAMUSCULAR | Status: DC | PRN

## 2022-03-13 MED ORDER — SODIUM CHLORIDE 0.9 % IV SOLN
2.0000 g | INTRAVENOUS | Status: DC
  Administered 2022-03-14 – 2022-03-17 (×4): 2 g via INTRAVENOUS
  Filled 2022-03-13 (×4): qty 20

## 2022-03-13 MED ORDER — ACETAMINOPHEN 325 MG PO TABS: 650.0000 mg | ORAL_TABLET | Freq: Four times a day (QID) | ORAL | Status: DC | PRN

## 2022-03-13 MED ORDER — LACTATED RINGERS IV SOLN: INTRAVENOUS | Status: DC

## 2022-03-13 MED ORDER — SODIUM CHLORIDE 0.9 % IV SOLN
2.0000 g | Freq: Once | INTRAVENOUS | Status: AC
  Administered 2022-03-13: 2 g via INTRAVENOUS
  Filled 2022-03-13: qty 20

## 2022-03-13 MED ORDER — SODIUM CHLORIDE 0.9 % IV BOLUS (SEPSIS)
1000.0000 mL | Freq: Once | INTRAVENOUS | Status: AC
Start: 2022-03-13 — End: 2022-03-13
  Administered 2022-03-13: 1000 mL via INTRAVENOUS

## 2022-03-13 MED ORDER — INSULIN ASPART 100 UNIT/ML IJ SOLN
0.0000 [IU] | INTRAMUSCULAR | Status: DC
  Administered 2022-03-13 – 2022-03-14 (×3): 1 [IU] via SUBCUTANEOUS

## 2022-03-13 MED ORDER — ACETAMINOPHEN 650 MG RE SUPP
650.0000 mg | Freq: Once | RECTAL | Status: AC
Start: 2022-03-13 — End: 2022-03-13
  Administered 2022-03-13: 650 mg via RECTAL
  Filled 2022-03-13: qty 1

## 2022-03-13 MED ORDER — METOPROLOL SUCCINATE ER 50 MG PO TB24
50.0000 mg | ORAL_TABLET | Freq: Every day | ORAL | Status: DC
Start: 2022-03-13 — End: 2022-03-18
  Administered 2022-03-14 – 2022-03-18 (×5): 50 mg via ORAL
  Filled 2022-03-13 (×5): qty 1

## 2022-03-13 MED ORDER — ONDANSETRON HCL 4 MG/2ML IJ SOLN: 4.0000 mg | Freq: Four times a day (QID) | INTRAMUSCULAR | Status: DC | PRN

## 2022-03-13 MED ORDER — ACETAMINOPHEN 650 MG RE SUPP: 650.0000 mg | Freq: Four times a day (QID) | RECTAL | Status: DC | PRN

## 2022-03-13 MED ORDER — LACTATED RINGERS IV BOLUS
1000.0000 mL | Freq: Once | INTRAVENOUS | Status: AC
  Administered 2022-03-13: 1000 mL via INTRAVENOUS

## 2022-03-13 MED ORDER — IOHEXOL 300 MG/ML  SOLN
100.0000 mL | Freq: Once | INTRAMUSCULAR | Status: AC | PRN
  Administered 2022-03-13: 100 mL via INTRAVENOUS

## 2022-03-13 MED ORDER — PANTOPRAZOLE SODIUM 40 MG IV SOLR
40.0000 mg | INTRAVENOUS | Status: DC
  Administered 2022-03-13 – 2022-03-17 (×4): 40 mg via INTRAVENOUS
  Filled 2022-03-13 (×4): qty 10

## 2022-03-13 MED ORDER — METRONIDAZOLE 500 MG/100ML IV SOLN
500.0000 mg | Freq: Once | INTRAVENOUS | Status: AC
  Administered 2022-03-13: 500 mg via INTRAVENOUS
  Filled 2022-03-13: qty 100

## 2022-03-13 MED ORDER — ONDANSETRON HCL 4 MG PO TABS: 4.0000 mg | ORAL_TABLET | Freq: Four times a day (QID) | ORAL | Status: DC | PRN

## 2022-03-13 MED ORDER — BOOST / RESOURCE BREEZE PO LIQD CUSTOM
1.0000 | Freq: Three times a day (TID) | ORAL | Status: DC
  Administered 2022-03-13 – 2022-03-17 (×5): 1 via ORAL

## 2022-03-13 NOTE — H&P (Addendum)
?History and Physical  ? ? ?Patient: Natalie Castro JJK:[id number] DOB: Jan 31, 1941 ?DOA: 03/13/2022 ?DOS: the patient was seen and examined on 03/13/2022 ?PCP: Redmond School, MD  ?Patient coming from: Home ? ?Chief Complaint:  ?Chief Complaint  ?Patient presents with  ? Emesis  ? Abdominal Pain  ? ?HPI: Natalie Castro is a 81 y.o. female with medical history significant of CAD, type 2 diabetes, complete heart block status post permanent pacemaker, hypertension, dyslipidemia, pulmonary embolus on apixaban, chronic diastolic CHF, and chronic cholecystitis with percutaneous drain who presented to the ED with worsening generalized abdominal pain as well as nausea and vomiting that appears to have began in the last 1 day.  She was noted to have a fever as well.  Upon chart review, it appears that she was supposed to have her percutaneous cholecystostomy drain replaced on 3/3 due to the fact that it was dysfunctional, it does not appear to have been done.  Patient is quite lethargic at this time and cannot give any further history. ? ?In the ED, CT scan of the abdomen and pelvis was performed with no acute process mentioned.  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  Cholecystostomy drain appears to be in good position.  She is noted to have significant LFT elevation and lipase is over 9000.  She has been started on 2 L fluid bolus and has been given Rocephin and Flagyl. ? ?Review of Systems: unable to review all systems due to the inability of the patient to answer questions. ?Past Medical History:  ?Diagnosis Date  ? Acid reflux   ? Arthritis   ? Colon adenomas   ? AGE 23  ? Complete heart block ([place])   ? STJ PPM Dr. [name] 11/24/15  ? Essential hypertension   ? History of pulmonary embolism 2017  ? unprovoked, long term anticoag with apixaban   ? Hyperlipidemia   ? Myoview 03/2021   ? Myoview 4/22: EF 50, no infarct or ischemia; low risk  ? Presence of permanent cardiac pacemaker   ?  Type 2 diabetes mellitus ([place])   ? ?Past Surgical History:  ?Procedure Laterality Date  ? COLONOSCOPY    ? 3 SIMPLE ADENOMAS, AGE 81  ? COLONOSCOPY N/A 05/24/2018  ? Procedure: COLONOSCOPY;  Surgeon: Danie Binder, MD;  [facility];  Service: Endoscopy;  Laterality: N/A;  1:00pm  ? EP IMPLANTABLE DEVICE N/A 11/24/2015  ? Procedure: Pacemaker Implant;  Surgeon: [name], MD;  Location: [place] [facility];  Service: Cardiovascular;  Laterality: N/A;  ? IR EXCHANGE BILIARY DRAIN  07/27/2021  ? IR EXCHANGE BILIARY DRAIN  08/23/2021  ? IR EXCHANGE BILIARY DRAIN  02/25/2022  ? IR PERC CHOLECYSTOSTOMY  07/08/2021  ? IR PERC CHOLECYSTOSTOMY  11/04/2021  ? IR RADIOLOGIST EVAL & MGMT  09/22/2021  ? 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  ? LEFT HEART CATH AND CORONARY ANGIOGRAPHY N/A 07/02/2021  ? Procedure: LEFT HEART CATH AND CORONARY ANGIOGRAPHY;  Surgeon: Jettie Booze, MD;  Location: [place] [facility];  Service: Cardiovascular;  Laterality: N/A;  ? POLYPECTOMY  05/24/2018  ? Procedure: POLYPECTOMY;  Surgeon: Danie Binder, MD;  [facility];  Service: Endoscopy;;  ascending and hepatic flexure, transverse  ? TEE WITHOUT CARDIOVERSION N/A 08/27/2021  ? Procedure: TRANSESOPHAGEAL ECHOCARDIOGRAM (TEE);  Surgeon: Pixie Casino, MD;  Location: [place];  Service: Cardiovascular;  Laterality: N/A;  ? TUBAL  LIGATION    ? ?Social History:  reports that she has never smoked. She has never used smokeless tobacco. She reports that she does not drink alcohol and does not use drugs. ? ?Allergies  ?Allergen Reactions  ? Penicillins Hives, Itching and Other (See Comments)  ?  Has tolerated ceftriaxone 7/22 ?Has patient had a PCN reaction causing immediate rash, facial/tongue/throat swelling, SOB or lightheadedness with hypotension: No ?Has patient had a PCN reaction causing severe rash involving mucus membranes or skin necrosis: Yes ?Has patient had a PCN reaction that required hospitalization No ?Has  patient had a PCN reaction occurring within the last 10 years: No ?If all of the above answers are "NO", then may proceed with Cephalosporin use. ?  ? ? ?Family History  ?Problem Relation Age of Onset  ? Heart failure Mother   ? Hypertension Mother   ? Heart failure Sister   ? Hypertension Father   ? Cancer Sister   ? Dementia Sister   ? Colon cancer Neg Hx   ? Colon polyps Neg Hx   ? ? ?Prior to Admission medications   ?Medication Sig Start Date End Date Taking? Authorizing Provider  ?apixaban (ELIQUIS) 5 MG TABS tablet Take 1 tablet (5 mg total) by mouth 2 (two) times daily. 02/17/22   Gerlene Fee, NP  ?atorvastatin (LIPITOR) 40 MG tablet Take 1 tablet (40 mg total) by mouth daily. 02/17/22   Gerlene Fee, NP  ?cyanocobalamin (,VITAMIN B-12,) 1000 MCG/ML injection Inject 1 mL (1,000 mcg total) into the muscle once a week for 28 days. 02/17/22 03/17/22  Gerlene Fee, NP  ?furosemide (LASIX) 40 MG tablet Take 1 tablet (40 mg total) by mouth daily. 02/17/22   Gerlene Fee, NP  ?glucose blood test strip Use to check blood glucose fasting , before lunch and dinner and after your largest meal 02/17/22   Gerlene Fee, NP  ?Insulin Pen Needle (BD AUTOSHIELD DUO) 30G X 5 MM MISC 30 gauge x 3/16" ?(pen needle,diabetic dual safty)    [provider]  ?Lancets Glory Rosebush ULTRASOFT) lancets Use to check glucose 4 x daily 02/17/22   Gerlene Fee, NP  ?LANTUS SOLOSTAR 100 UNIT/ML Solostar Pen Inject 10 Units into the skin at bedtime. 02/17/22   Gerlene Fee, NP  ?metFORMIN (GLUCOPHAGE) 1000 MG tablet TAKE (1) TABLET BY MOUTH TWICE DAILY. 02/17/22   Gerlene Fee, NP  ?metoprolol succinate (TOPROL-XL) 50 MG 24 hr tablet Take 1 tablet (50 mg total) by mouth daily. Take with or immediately following a meal. 02/17/22   Gerlene Fee, NP  ?nitroGLYCERIN (NITROSTAT) 0.4 MG SL tablet Place 1 tablet (0.4 mg total) under the tongue every 5 (five) minutes as needed for chest pain. 02/17/22 05/18/22  Gerlene Fee, NP  ?polyethylene glycol (MIRALAX / GLYCOLAX) 17 g packet Take 17 g by mouth daily as needed for mild constipation. 08/28/21   Cristal Deer, MD  ? ? ?Physical Exam: ?Vitals:  ? 03/13/22 0800 03/13/22 0830 03/13/22 0900 03/13/22 0930  ?BP: (!) 119/49 (!) 116/48 (!) 123/51 (!) 123/51  ?Pulse: 60 60 65 60  ?Resp: '11 17 19 16  '$ ?Temp:      ?TempSrc:      ?SpO2: 98% 95% 90% 97%  ? ?General exam: Lethargic ?Respiratory system: Clear to auscultation. Respiratory effort normal. ?Cardiovascular system:RRR. No murmurs, rubs, gallops. ?Gastrointestinal system: Abdomen is nondistended, soft and nontender. No organomegaly or masses felt. Normal bowel sounds heard. Cholecystostomy tube  in RUQ, C/D/I ?Central nervous system: Alert and oriented. No focal neurological deficits. ?Extremities: No C/C/E, +pedal pulses ?Skin: No rashes, lesions or ulcers ?Psychiatry: Cannot be obtained ? ?Data Reviewed: ? ?There are no new results to review at this time. ? ?Assessment and Plan: ? ?Sepsis, present on admission with possible acute on chronic cholecystitis ?-Continue Rocephin and Flagyl along with aggressive IV fluid ?-Monitor labs ?-Appreciate general surgery consultation ?-Discussed case with IR with need to try and flush tubing and if this cannot be done, will consider urgent drain replacement, but this is not due until 4/14.  Gallbladder appears decompressed on imaging ? ?Acute pancreatitis ?-Significant lipase elevation noted and may require ERCP ?-Lipid panel ordered ?-Appreciate GI evaluation ? 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 ?Chronic diastolic CHF ?-LVEF 32-91% ?-Holding Lasix for now ?-Monitor volume status closely with aggressive IV fluid ?-Daily weights ? ?CAD/complete heart block with prior pacemaker ?-Cannot have MRCP ?-Hold statin and aspirin for now ? ?Pulmonary embolus on Eliquis ?-Hold Eliquis for  now ?-Maintain on heparin drip ?-Maintain on metoprolol for heart rate control ? ?Hypertension ?-Maintain on metoprolol for heart rate control ?-Avoid Lasix ? ?Dyslipidemia ?-Holding statin given above findings

## 2022-03-13 NOTE — H&P (View-Only) (Signed)
[facility] Consult ? ?Reason for Consult: Pancreatitis / Cholecystostomy tube in place ?Referring Physician:  Dr. Manuella Ghazi  ? ?Chief Complaint   ?Emesis; Abdominal Pain ?  ? ? ?HPI: Natalie Castro is a 81 y.o. female with a history of acute cholecystitis in July 2022 in the setting of PNA, CHF, HTN, Pacemaker, PE many years ago and had a cholecystostomy tube placed then. [facility] notes indicate they did not think she was an operative candidate at that time but she was also sick with PNA. ? ?She has had the cholecystostomy tube and continues to have complications from the tube and has failed getting it out. She had an IR scan for exchange 02/25/22 and this demonstrated a patent cystic duct and dilated CBD with sludge/ stone material in the duct.  ? ?She presented yesterday with feelings of abdominal pain, nausea and vomiting after going to a funeral. She also had temperature to 103. She was evaluated and found to have pancreatitis on CT with lipase 9000 and a right breast mass.   ? ?On looking at her imaging from previous US 06/2021  did not show stones, 10/2021 she had sludge and what I am guessing is she has developed sludge and material after having the tube in place for so long.  ? ?She has not baseline chest pain but does get SOB. Her drain has been working and draining and continues to have bilious output. It was flushed today and flushed easily.  She takes eliquis for her prior PE and took it last night.  ? ?Past Medical History:  ?Diagnosis Date  ? Acid reflux   ? Arthritis   ? Colon adenomas   ? AGE 83  ? Complete heart block ([place])   ? STJ PPM Dr. [name] 11/24/15  ? Essential hypertension   ? History of pulmonary embolism 2017  ? unprovoked, long term anticoag with apixaban   ? Hyperlipidemia   ? Myoview 03/2021   ? Myoview 4/22: EF 50, no infarct or ischemia; low risk  ? Presence of permanent cardiac pacemaker   ? Type 2 diabetes mellitus ([place])   ? ? ?Past Surgical History:   ?Procedure Laterality Date  ? COLONOSCOPY    ? 3 SIMPLE ADENOMAS, AGE 80  ? COLONOSCOPY N/A 05/24/2018  ? Procedure: COLONOSCOPY;  Surgeon: Danie Binder, MD;  [facility];  Service: Endoscopy;  Laterality: N/A;  1:00pm  ? EP IMPLANTABLE DEVICE N/A 11/24/2015  ? Procedure: Pacemaker Implant;  Surgeon: [name], MD;  Location: [place] [facility];  Service: Cardiovascular;  Laterality: N/A;  ? IR EXCHANGE BILIARY DRAIN  07/27/2021  ? IR EXCHANGE BILIARY DRAIN  08/23/2021  ? IR EXCHANGE BILIARY DRAIN  02/25/2022  ? IR PERC CHOLECYSTOSTOMY  07/08/2021  ? IR PERC CHOLECYSTOSTOMY  11/04/2021  ? IR RADIOLOGIST EVAL & MGMT  09/22/2021  ? 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  ? LEFT HEART CATH AND CORONARY ANGIOGRAPHY N/A 07/02/2021  ? Procedure: LEFT HEART CATH AND CORONARY ANGIOGRAPHY;  Surgeon: Jettie Booze, MD;  Location: [place] [facility];  Service: Cardiovascular;  Laterality: N/A;  ? POLYPECTOMY  05/24/2018  ? Procedure: POLYPECTOMY;  Surgeon: Danie Binder, MD;  [facility];  Service: Endoscopy;;  ascending and hepatic flexure, transverse  ? TEE WITHOUT CARDIOVERSION N/A 08/27/2021  ? Procedure: TRANSESOPHAGEAL ECHOCARDIOGRAM (TEE);  Surgeon: Pixie Casino, MD;  Location: [place];  Service: Cardiovascular;  Laterality: N/A;  ?  TUBAL LIGATION    ? ? ?Family History  ?Problem Relation Age of Onset  ? Heart failure Mother   ? Hypertension Mother   ? Heart failure Sister   ? Hypertension Father   ? Cancer Sister   ? Dementia Sister   ? Colon cancer Neg Hx   ? Colon polyps Neg Hx   ? ? ?Social History  ? ?Tobacco Use  ? Smoking status: Never  ? Smokeless tobacco: Never  ?Vaping Use  ? Vaping Use: Never used  ?Substance Use Topics  ? Alcohol use: No  ?  Alcohol/week: 0.0 standard drinks  ? Drug use: No  ? ? ?Medications: I have reviewed the patient's current medications. ?Prior to Admission: (Not in a hospital admission)  ?Scheduled: ? insulin aspart  0-9 Units Subcutaneous Q4H  ?  metoprolol succinate  50 mg Oral Daily  ? pantoprazole (PROTONIX) IV  40 mg Intravenous Q24H  ? ?Continuous: ? [START ON 03/14/2022] cefTRIAXone (ROCEPHIN)  IV    ? And  ? metronidazole    ? heparin 1,150 Units/hr (03/13/22 1310)  ? lactated ringers 150 mL/hr at 03/13/22 0817  ? ?GBT:[id number] **OR** acetaminophen, HYDROmorphone (DILAUDID) injection, ondansetron (ZOFRAN) IV, ondansetron **OR** ondansetron (ZOFRAN) IV ? ?Allergies  ?Allergen Reactions  ? Penicillins Hives, Itching and Other (See Comments)  ?  Has tolerated ceftriaxone 7/22 ?Has patient had a PCN reaction causing immediate rash, facial/tongue/throat swelling, SOB or lightheadedness with hypotension: No ?Has patient had a PCN reaction causing severe rash involving mucus membranes or skin necrosis: Yes ?Has patient had a PCN reaction that required hospitalization No ?Has patient had a PCN reaction occurring within the last 10 years: No ?If all of the above answers are "NO", then may proceed with Cephalosporin use. ?  ? ? ? ?ROS:  ?A comprehensive review of systems was negative except for: Constitutional: positive for fatigue and fevers ?Gastrointestinal: positive for abdominal pain and nausea ? ?Blood pressure 139/71, pulse 86, temperature 99.6 ?F (37.6 ?C), temperature source Oral, resp. rate 17, height '5\' 4"'$  (1.626 m), weight 80.1 kg, SpO2 96 %. ?Physical Exam ?Vitals reviewed.  ?Constitutional:   ?   Appearance: She is well-developed.  ?HENT:  ?   Head: Normocephalic.  ?Eyes:  ?   Extraocular Movements: Extraocular movements intact.  ?Cardiovascular:  ?   Rate and Rhythm: Normal rate.  ?Pulmonary:  ?   Effort: Pulmonary effort is normal.  ?Abdominal:  ?   Palpations: Abdomen is soft.  ?   Comments: IR  drain with bilious output  ?Skin: ?   General: Skin is warm.  ?Neurological:  ?   General: No focal deficit present.  ?   Mental Status: She is alert.  ?Psychiatric:     ?   Mood and Affect: Mood normal.  ? ? ?Results: ?Results for orders placed or  performed during the hospital encounter of 03/13/22 (from the past 48 hour(s))  ?Comprehensive metabolic panel     Status: Abnormal  ? Collection Time: 03/13/22  1:15 AM  ?Result Value Ref Range  ? Sodium 137 135 - 145 mmol/L  ? Potassium 3.7 3.5 - 5.1 mmol/L  ? Chloride 98 98 - 111 mmol/L  ? CO2 27 22 - 32 mmol/L  ? Glucose, Bld 225 (H) 70 - 99 mg/dL  ?  Comment: Glucose reference range applies only to samples taken after fasting for at least 8 hours.  ? BUN 17 8 - 23 mg/dL  ? Creatinine, Ser 1.09 (H)  0.44 - 1.00 mg/dL  ? Calcium 8.6 (L) 8.9 - 10.3 mg/dL  ? Total Protein 7.7 6.5 - 8.1 g/dL  ? Albumin 2.7 (L) 3.5 - 5.0 g/dL  ? AST 138 (H) 15 - 41 U/L  ? ALT 55 (H) 0 - 44 U/L  ? Alkaline Phosphatase 154 (H) 38 - 126 U/L  ? Total Bilirubin 1.6 (H) 0.3 - 1.2 mg/dL  ? GFR, Estimated 51 (L) >60 mL/min  ?  Comment: (NOTE) ?Calculated using the CKD-EPI Creatinine Equation (2021) ?  ? Anion gap 12 5 - 15  ?  Comment: Performed at [facility], [address] [place], [place] [address]  ?Lactic acid, plasma     Status: None  ? Collection Time: 03/13/22  1:15 AM  ?Result Value Ref Range  ? Lactic Acid, Venous 1.9 0.5 - 1.9 mmol/L  ?  Comment: Performed at [facility], [address] [place], [place] [address]  ?CBC with Differential     Status: Abnormal  ? Collection Time: 03/13/22  1:15 AM  ?Result Value Ref Range  ? WBC 11.3 (H) 4.0 - 10.5 K/uL  ? RBC 3.84 (L) 3.87 - 5.11 MIL/uL  ? Hemoglobin 11.1 (L) 12.0 - 15.0 g/dL  ? HCT 36.2 36.0 - 46.0 %  ? MCV 94.3 80.0 - 100.0 fL  ? MCH 28.9 26.0 - 34.0 pg  ? MCHC 30.7 30.0 - 36.0 g/dL  ? RDW 13.4 11.5 - 15.5 %  ? Platelets 188 150 - 400 K/uL  ? nRBC 0.0 0.0 - 0.2 %  ? Neutrophils Relative % 85 %  ? Neutro Abs 9.6 (H) 1.7 - 7.7 K/uL  ? Lymphocytes Relative 8 %  ? Lymphs Abs 0.9 0.7 - 4.0 K/uL  ? Monocytes Relative 6 %  ? Monocytes Absolute 0.7 0.1 - 1.0 K/uL  ? Eosinophils Relative 0 %  ? Eosinophils Absolute 0.0 0.0 - 0.5 K/uL  ? Basophils Relative 0 %  ? Basophils Absolute 0.0  0.0 - 0.1 K/uL  ? Immature Granulocytes 1 %  ? Abs Immature Granulocytes 0.10 (H) 0.00 - 0.07 K/uL  ?  Comment: Performed at [facility], [address] [place],  [address]  ?Protime-INR     Status: Abnormal

## 2022-03-13 NOTE — ED Triage Notes (Signed)
Pt began vomiting today x 3 with abd pain rated at 8/10 ?

## 2022-03-13 NOTE — Consult Note (Addendum)
Referring Provider: Heath Lark, DO ?Primary Care Physician:  Redmond School, MD ?Primary Gastroenterologist:  Dr. Laural Golden ? ?Reason for Consultation:   ? ?Acute pancreatitis in a patient who biliary drain since August 2022 for acute cholecystitis. ? ?HPI:  ? ?Patient is 81 year old African-American female who presented with acute cholecystitis back in July 2022.  She was deemed to be high risk.  She had cholecystostomy placed by IR on 07/27/2021.  This tube has been changed periodically.  Most recently on 02/25/2022.  Past history is significant for hypertension, CHF, diabetes mellitus history of pulmonary embolism on anticoagulant status post pacemaker insertion and November 2016 for complete heart block. ? ?Patient was feeling fine when she woke up yesterday.  She had fruit at breakfast and small lunch.  She went to a funeral.  She came back and she was not feeling well.  Around 9 PM she developed pain in in her upper abdomen associated with fever nausea and vomiting.  According her daughter Greggory Brandy her temp was 103.  Patient was brought to emergency room around midnight.  Evaluation in emergency room revealed WBC of 11.3 with H&H of 11.1 and 36.2 and platelet count of 188K.  Serum lipase was 9034.  Bilirubin was 1.6, AP 154 AST 138 ALT 55 and albumin 2.7.  INR was 1.7.  Lactic acid level was 1.2.  Urine analysis was positive for ketones.  Revealed 0-5 WBCs and 6-10 RBCs.  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  Chronic indwelling cholecystostomy tube and cardiomegaly.  Dilated bile duct with filling defect in midportion. ?Patient was admitted to Dr. Trena Platt service.  Patient begun on IV fluids, IV ceftriaxone and metronidazole and heparin infusion. ?Patient feels better.  She does not have abdominal pain anymore.  She denies chest pain or shortness of breath.  She does  experience exertional dyspnea if she walks too much inside the house.  She says she has been having these spells of abdominal pain and nausea and vomiting intermittently since she had cholecystostomy. ?Patient is on apixaban and last dose was yesterday evening. ? ?Patient is widowed.  She lives with one of her daughters.  She has 3 daughters and 1 son living.  One of her sons died at age 6 due to an auto accident.  She worked at 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  She has never smoked cigarettes and does not drink alcohol. ?Family history is positive for diabetes.  2 of her daughters are diabetic and her father was also diabetic. ? ? ? ?Past Medical History:  ?Diagnosis Date  ? Acid reflux   ? Arthritis   ? Colon adenomas   ? AGE 37  ? Complete heart block ([place])   ? STJ PPM Dr. [name] 11/24/15  ? Essential hypertension   ? History of pulmonary embolism 2017  ? unprovoked, long term anticoag with apixaban   ? Hyperlipidemia   ? Myoview 03/2021   ? Myoview 4/22: EF 50, no infarct or ischemia; low risk  ? Presence of permanent cardiac pacemaker   ? Type 2 diabetes mellitus ([place])   ? ? ?Past Surgical History:  ?Procedure Laterality Date  ? COLONOSCOPY    ? 3 SIMPLE ADENOMAS, AGE 25  ? COLONOSCOPY N/A 05/24/2018  ? Procedure: COLONOSCOPY;  Surgeon: Danie Binder, MD;  [facility];  Service: Endoscopy;  Laterality: N/A;  1:00pm  ? EP IMPLANTABLE DEVICE N/A 11/24/2015  ? Procedure: Pacemaker Implant;  Surgeon: [name], MD;  Location: [place] [facility];  Service: Cardiovascular;  Laterality: N/A;  ? IR EXCHANGE BILIARY DRAIN  07/27/2021  ? IR EXCHANGE BILIARY DRAIN  08/23/2021  ? IR EXCHANGE BILIARY DRAIN  02/25/2022  ? IR PERC CHOLECYSTOSTOMY  07/08/2021  ? IR PERC CHOLECYSTOSTOMY  11/04/2021  ? IR RADIOLOGIST EVAL & MGMT  09/22/2021  ? 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  ? LEFT HEART CATH AND CORONARY ANGIOGRAPHY N/A 07/02/2021   ? Procedure: LEFT HEART CATH AND CORONARY ANGIOGRAPHY;  Surgeon: Jettie Booze, MD;  Location: [place] [facility];  Service: Cardiovascular;  Laterality: N/A;  ? POLYPECTOMY  05/24/2018  ? Procedure: POLYPECTOMY;  Surgeon: Danie Binder, MD;  [facility];  Service: Endoscopy;;  ascending and hepatic flexure, transverse  ? TEE WITHOUT CARDIOVERSION N/A 08/27/2021  ? Procedure: TRANSESOPHAGEAL ECHOCARDIOGRAM (TEE);  Surgeon: Pixie Casino, MD;  Location: [facility];  Service: Cardiovascular;  Laterality: N/A;  ? TUBAL LIGATION    ? ? ?Prior to Admission medications   ?Medication Sig Start Date End Date Taking? Authorizing Provider  ?apixaban (ELIQUIS) 5 MG TABS tablet Take 1 tablet (5 mg total) by mouth 2 (two) times daily. 02/17/22   Gerlene Fee, NP  ?atorvastatin (LIPITOR) 40 MG tablet Take 1 tablet (40 mg total) by mouth daily. 02/17/22   Gerlene Fee, NP  ?cyanocobalamin (,VITAMIN B-12,) 1000 MCG/ML injection Inject 1 mL (1,000 mcg total) into the muscle once a week for 28 days. 02/17/22 03/17/22  Gerlene Fee, NP  ?furosemide (LASIX) 40 MG tablet Take 1 tablet (40 mg total) by mouth daily. 02/17/22   Gerlene Fee, NP  ?glucose blood test strip Use to check blood glucose fasting , before lunch and dinner and after your largest meal 02/17/22   Gerlene Fee, NP  ?Insulin Pen Needle (BD AUTOSHIELD DUO) 30G X 5 MM MISC 30 gauge x 3/16" ?(pen needle,diabetic dual safty)    [provider]  ?Lancets Glory Rosebush ULTRASOFT) lancets Use to check glucose 4 x daily 02/17/22   Gerlene Fee, NP  ?LANTUS SOLOSTAR 100 UNIT/ML Solostar Pen Inject 10 Units into the skin at bedtime. 02/17/22   Gerlene Fee, NP  ?metFORMIN (GLUCOPHAGE) 1000 MG tablet TAKE (1) TABLET BY MOUTH TWICE DAILY. 02/17/22   Gerlene Fee, NP  ?metoprolol succinate (TOPROL-XL) 50 MG 24 hr tablet Take 1 tablet (50 mg total) by mouth daily. Take with or immediately following a meal. 02/17/22   Gerlene Fee, NP  ?nitroGLYCERIN (NITROSTAT) 0.4 MG SL tablet Place 1 tablet (0.4 mg total) under the tongue every 5 (five) minutes as needed for chest pain. 02/17/22 05/18/22  Gerlene Fee, NP  ?polyethylene glycol (MIRALAX / GLYCOLAX) 17 g packet Take 17 g by mouth daily as needed for mild constipation. 08/28/21   Cristal Deer, MD  ? ? ?Current Facility-Administered Medications  ?Medication Dose Route Frequency Provider Last Rate Last Admin  ? acetaminophen (TYLENOL) tablet 650 mg  650 mg Oral Q6H PRN Manuella Ghazi, Pratik D, DO      ? Or  ? acetaminophen (TYLENOL) suppository 650 mg  650 mg Rectal Q6H PRN Manuella Ghazi, Pratik D, DO      ? [START ON 03/14/2022] cefTRIAXone (ROCEPHIN) 2 g in  sodium chloride 0.9 % 100 mL IVPB  2 g Intravenous Q24H Manuella Ghazi, Pratik D, DO      ? And  ? metroNIDAZOLE (FLAGYL) IVPB 500 mg  500 mg Intravenous Q12H Shah, Pratik D, DO      ? heparin ADULT infusion 100 units/mL (25000 units/251m)  1,150 Units/hr Intravenous Continuous SManuella Ghazi Pratik D, DO 11.5 mL/hr at 03/13/22 1310 1,150 Units/hr at 03/13/22 1310  ? HYDROmorphone (DILAUDID) injection 0.5-1 mg  0.5-1 mg Intravenous Q2H PRN SManuella Ghazi Pratik D, DO      ? insulin aspart (novoLOG) injection 0-9 Units  0-9 Units Subcutaneous Q4H Shah, Pratik D, DO      ? lactated ringers infusion   Intravenous Continuous SHeath LarkD, DO 150 mL/hr at 03/13/22 0817 New Bag at 03/13/22 0817  ? metoprolol succinate (TOPROL-XL) 24 hr tablet 50 mg  50 mg Oral Daily SManuella Ghazi Pratik D, DO      ? ondansetron (ZOFRAN) injection 4 mg  4 mg Intravenous Q6H PRN SManuella Ghazi Pratik D, DO      ? ondansetron (ZOFRAN) tablet 4 mg  4 mg Oral Q6H PRN SManuella Ghazi Pratik D, DO      ? Or  ? ondansetron (ZOFRAN) injection 4 mg  4 mg Intravenous Q6H PRN SManuella Ghazi Pratik D, DO      ? ?Current Outpatient Medications  ?Medication Sig Dispense Refill  ? apixaban (ELIQUIS) 5 MG TABS tablet Take 1 tablet (5 mg total) by mouth 2 (two) times daily. 60 tablet 0  ? atorvastatin (LIPITOR) 40 MG tablet Take 1 tablet (40 mg total)  by mouth daily. 30 tablet 0  ? cyanocobalamin (,VITAMIN B-12,) 1000 MCG/ML injection Inject 1 mL (1,000 mcg total) into the muscle once a week for 28 days. 4 mL 0  ? furosemide (LASIX) 40 MG tablet Take 1 tab

## 2022-03-13 NOTE — ED Provider Notes (Signed)
?[facility] ?Provider Note ? ? ?CSN: [id number] ?Arrival date & time: 03/12/22  2325 ? ?  ? ?History ? ?Chief Complaint  ?Patient presents with  ? Emesis  ? Abdominal Pain  ? ? ?Natalie Castro is a 81 y.o. female. ? ?The history is provided by the patient and a relative.  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  This started over the past several hours.  She reports the pain is improving.  She is also febrile. ?No cough or new shortness of breath. ?  ?Past Medical History:  ?Diagnosis Date  ? Acid reflux   ? Arthritis   ? Colon adenomas   ? AGE 81  ? Complete heart block ([place])   ? STJ PPM Dr. [name] 11/24/15  ? Essential hypertension   ? History of pulmonary embolism 2017  ? unprovoked, long term anticoag with apixaban   ? Hyperlipidemia   ? Myoview 03/2021   ? Myoview 4/22: EF 50, no infarct or ischemia; low risk  ? Presence of permanent cardiac pacemaker   ? Type 2 diabetes mellitus ([place])   ? ? ?Home Medications ?Prior to Admission medications   ?Medication Sig Start Date End Date Taking? Authorizing Provider  ?apixaban (ELIQUIS) 5 MG TABS tablet Take 1 tablet (5 mg total) by mouth 2 (two) times daily. 02/17/22   Gerlene Fee, NP  ?atorvastatin (LIPITOR) 40 MG tablet Take 1 tablet (40 mg total) by mouth daily. 02/17/22   Gerlene Fee, NP  ?cyanocobalamin (,VITAMIN B-12,) 1000 MCG/ML injection Inject 1 mL (1,000 mcg total) into the muscle once a week for 28 days. 02/17/22 03/17/22  Gerlene Fee, NP  ?furosemide (LASIX) 40 MG tablet Take 1 tablet (40 mg total) by mouth daily.  02/17/22   Gerlene Fee, NP  ?glucose blood test strip Use to check blood glucose fasting , before lunch and dinner and after your largest meal 02/17/22   Gerlene Fee, NP  ?Insulin Pen Needle (BD AUTOSHIELD DUO) 30G X 5 MM MISC 30 gauge x 3/16" ?(pen needle,diabetic dual safty)    [provider]  ?Lancets Glory Rosebush ULTRASOFT) lancets Use to check glucose 4 x daily 02/17/22   Gerlene Fee, NP  ?LANTUS SOLOSTAR 100 UNIT/ML Solostar Pen Inject 10 Units into the skin at bedtime. 02/17/22   Gerlene Fee, NP  ?metFORMIN (GLUCOPHAGE) 1000 MG tablet TAKE (1) TABLET BY MOUTH TWICE DAILY. 02/17/22   Gerlene Fee, NP  ?metoprolol succinate (TOPROL-XL) 50 MG 24 hr tablet Take 1 tablet (50 mg total) by mouth daily. Take with or immediately following a meal. 02/17/22   Gerlene Fee, NP  ?nitroGLYCERIN (NITROSTAT) 0.4 MG SL tablet Place 1 tablet (0.4 mg total) under the tongue every 5 (five) minutes as needed for chest pain. 02/17/22 05/18/22  Gerlene Fee, NP  ?polyethylene glycol (MIRALAX / GLYCOLAX) 17 g packet Take 17 g by mouth daily as needed for mild constipation. 08/28/21   Cristal Deer, MD  ?   ? ?Allergies    ?Penicillins   ? ?Review of Systems   ?Review of Systems  ?Constitutional:  Positive for fever.  ?Gastrointestinal:  Positive for abdominal pain and vomiting.  ?Genitourinary:  Negative for dysuria.  ?All other systems reviewed and are negative. ? ?Physical Exam ?Updated Vital Signs ?BP (!) 120/44   Pulse 73   Temp (!) 103.2 ?F (39.6 ?C) (Oral)   Resp (!) 22   SpO2 96%  ?Physical Exam ?CONSTITUTIONAL: Elderly, no acute distress ?HEAD: Normocephalic/atraumatic ?EYES: EOMI/PERRL ?ENMT: Mucous membranes moist ?NECK: supple no meningeal signs ?SPINE/BACK:entire spine nontender ?CV: S1/S2 noted, no murmurs/rubs/gallops noted ?LUNGS: Lungs are clear to auscultation bilaterally, no apparent distress ?ABDOMEN: soft, obese.  Cholecystostomy tube is in place and is draining.  No  significant abdominal tenderness ?GU:no cva tenderness ?NEURO: Pt is awake/alert/appropriate, moves all extremitiesx4.  No facial droop.   ?EXTREMITIES: pulses normal/equal, full ROM ?SKIN: warm, color normal ?PSYCH: no abnormalities of mood noted, alert and oriented to situation ? ?ED Results / Procedures / Treatments   ?Labs ?(all labs ordered are listed, but only abnormal results are displayed) ?Labs Reviewed  ?COMPREHENSIVE METABOLIC PANEL - Abnormal; Notable for the following components:  ?    Result Value  ? Glucose, Bld 225 (*)   ? Creatinine, Ser 1.09 (*)   ? Calcium 8.6 (*)   ? Albumin 2.7 (*)   ? AST 138 (*)   ? ALT 55 (*)   ? Alkaline Phosphatase 154 (*)   ? Total Bilirubin 1.6 (*)   ? GFR, Estimated 51 (*)   ? All other components within normal limits  ?CBC WITH DIFFERENTIAL/PLATELET - Abnormal; Notable for the following components:  ? WBC 11.3 (*)   ? RBC 3.84 (*)   ? Hemoglobin 11.1 (*)   ? Neutro Abs 9.6 (*)   ? Abs Immature Granulocytes 0.10 (*)   ? All other components within normal limits  ?PROTIME-INR - Abnormal; Notable for the following components:  ? Prothrombin Time 19.6 (*)   ? INR 1.7 (*)   ? All other components within normal limits  ?URINALYSIS, ROUTINE W REFLEX MICROSCOPIC - Abnormal; Notable for the following components:  ? Color, Urine AMBER (*)   ? Specific Gravity, Urine 1.040 (*)   ? Hgb urine dipstick SMALL (*)   ? Ketones, ur 5 (*)   ? Protein, ur 30 (*)   ? All other components within normal limits  ?LIPASE, BLOOD - Abnormal; Notable for the following components:  ? Lipase 9,034 (*)   ? All other components within normal limits  ?CULTURE, BLOOD (ROUTINE X 2)  ?CULTURE, BLOOD (ROUTINE X 2)  ?LACTIC ACID, PLASMA  ?LACTIC ACID, PLASMA  ? ? ?EKG ?EKG Interpretation ? ?Date/Time:  Sunday March 13 2022 01:17:36 EDT ?Ventricular Rate:  74 ?PR Interval:  240 ?QRS Duration: 169 ?QT Interval:  437 ?QTC Calculation: 485 ?R Axis:   -80 ?Text Interpretation: Sinus rhythm Prolonged PR interval  IVCD, consider atypical RBBB LVH with secondary repolarization abnormality Confirmed by Ripley Fraise [phone]) on 03/13/2022 1:32:03 AM ? ?Radiology ?DG Chest 1 View ? ?Result Date: 03/13/2022 ?CLINICAL DATA:  Vomiting EXAM: CHEST  1 VIEW COMPARISON:  None. FINDINGS: The heart size and mediastinal contours are within normal limits. Both lungs are clear. The visualized skeletal structures are unremarkable. Unchanged position of left chest wall pacemaker. IMPRESSION: No active disease. Electronically Signed   By: Ulyses Jarred M.D.   On: 03/13/2022 02:20   ? ?Procedures ?Procedures  ? ? ?Medications Ordered in ED ?Medications  ?acetaminophen (TYLENOL) suppository 650 mg (650 mg Rectal Given 03/13/22 0234)  ?  lactated ringers bolus 1,000 mL (0 mLs Intravenous Stopped 03/13/22 0234)  ?cefTRIAXone (ROCEPHIN) 2 g in sodium chloride 0.9 % 100 mL IVPB (0 g Intravenous Stopped 03/13/22 0431)  ?  And  ?metroNIDAZOLE (FLAGYL) IVPB 500 mg (0 mg Intravenous Stopped 03/13/22 0651)  ?iohexol (OMNIPAQUE) 300 MG/ML solution 100 mL (100 mLs Intravenous Contrast Given 03/13/22 0423)  ?sodium chloride 0.9 % bolus 1,000 mL (1,000 mLs Intravenous New Bag/Given 03/13/22 0606)  ? ? ?ED Course/ Medical Decision Making/ A&P ?Clinical Course as of 03/13/22 0702  ?Sun Mar 13, 2022  ?0242 Glucose(!): 225 ?Hyperglycemia and [DW]  ?0242 WBC(!): 11.3 ?Leukocytosis noted [DW]  ?0319 Pt stable awaiting CT scan ? [DW]  ?3570 CT results did not reveal any acute abdominal emergency.  However she may have signs of breast mass.  Patient denies any breast pain or swelling.  Exam done with nurse chaperone present.  No large breast mass or abscess is noted on either side.  Urinalysis and lipase are pending [DW]  ?[phone] Decision to admit patient due to fever of unknown etiology and a chronically ill patient.  Patient has known history of chronic cholecystitis.  She has received IV fluids and antibiotics.  Discussed this with her daughter via phone.  Patient was  agreeable with me to talk to family.  Daughter [name] was made aware of findings including breast mass and need for close follow-up [DW]  ?0700 D/w dr zierle for admission.  Pt with possible worsening cholecystitis, LFT's w

## 2022-03-13 NOTE — Progress Notes (Signed)
ANTICOAGULATION CONSULT NOTE - Initial Consult ? ?Pharmacy Consult for heparin ?Indication: hx of pulmonary embolus ? ?Allergies  ?Allergen Reactions  ? Penicillins Hives, Itching and Other (See Comments)  ?  Has tolerated ceftriaxone 7/22 ?Has patient had a PCN reaction causing immediate rash, facial/tongue/throat swelling, SOB or lightheadedness with hypotension: No ?Has patient had a PCN reaction causing severe rash involving mucus membranes or skin necrosis: Yes ?Has patient had a PCN reaction that required hospitalization No ?Has patient had a PCN reaction occurring within the last 10 years: No ?If all of the above answers are "NO", then may proceed with Cephalosporin use. ?  ? ? ?Patient Measurements: ?Height: '5\' 4"'$  (162.6 cm) ?Weight: 86.6 kg (191 lb) ?IBW/kg (Calculated) : 54.7 ?Heparin Dosing Weight: 74 kg ? ?Vital Signs: ?Temp: 99.6 ?F (37.6 ?C) (03/19 0328) ?Temp Source: Oral (03/19 0328) ?BP: 136/64 (03/19 1030) ?Pulse Rate: 58 (03/19 1030) ? ?Labs: ?Recent Labs  ?  03/13/22 ?0115  ?HGB 11.1*  ?HCT 36.2  ?PLT 188  ?LABPROT 19.6*  ?INR 1.7*  ?CREATININE 1.09*  ? ? ?Estimated Creatinine Clearance: 43.9 mL/min (A) (by C-G formula based on SCr of 1.09 mg/dL (H)). ? ? ?Medical History: ?Past Medical History:  ?Diagnosis Date  ? Acid reflux   ? Arthritis   ? Colon adenomas   ? AGE 59  ? Complete heart block ([place])   ? STJ PPM Dr. [name] 11/24/15  ? Essential hypertension   ? History of pulmonary embolism 2017  ? unprovoked, long term anticoag with apixaban   ? Hyperlipidemia   ? Myoview 03/2021   ? Myoview 4/22: EF 50, no infarct or ischemia; low risk  ? Presence of permanent cardiac pacemaker   ? Type 2 diabetes mellitus ([place])   ? ? ?Medications:  ?(Not in a hospital admission)  ? ?Assessment: ?Pharmacy consulted to dose heparin for patient with history of pulmonary embolism 2017.  Patient is on apixaban prior to admission with last dose 3/18 unknown time.  Will need to monitor based on aPTT until correlation  with heparin levels. ? ?CBC WNL ? ?Goal of Therapy:  ?Heparin level 0.3-0.7 units/ml ?aPTT 66-102 seconds ?Monitor platelets by anticoagulation protocol: Yes ?  ?Plan:  ?Start heparin infusion at 1150 units/hr ?Check anti-Xa level in 8 hours and daily while on heparin ?Continue to monitor H&H and platelets ? ?Margot Ables, PharmD ?Clinical Pharmacist ?03/13/2022 12:09 PM ? ? ?

## 2022-03-13 NOTE — Consult Note (Addendum)
[facility] Consult ? ?Reason for Consult: Pancreatitis / Cholecystostomy tube in place ?Referring Physician:  Dr. Manuella Ghazi  ? ?Chief Complaint   ?Emesis; Abdominal Pain ?  ? ? ?HPI: Natalie Castro is a 81 y.o. female with a history of acute cholecystitis in July 2022 in the setting of PNA, CHF, HTN, Pacemaker, PE many years ago and had a cholecystostomy tube placed then. [facility] notes indicate they did not think she was an operative candidate at that time but she was also sick with PNA. ? ?She has had the cholecystostomy tube and continues to have complications from the tube and has failed getting it out. She had an IR scan for exchange 02/25/22 and this demonstrated a patent cystic duct and dilated CBD with sludge/ stone material in the duct.  ? ?She presented yesterday with feelings of abdominal pain, nausea and vomiting after going to a funeral. She also had temperature to 103. She was evaluated and found to have pancreatitis on CT with lipase 9000 and a right breast mass.   ? ?On looking at her imaging from previous US 06/2021  did not show stones, 10/2021 she had sludge and what I am guessing is she has developed sludge and material after having the tube in place for so long.  ? ?She has not baseline chest pain but does get SOB. Her drain has been working and draining and continues to have bilious output. It was flushed today and flushed easily.  She takes eliquis for her prior PE and took it last night.  ? ?Past Medical History:  ?Diagnosis Date  ? Acid reflux   ? Arthritis   ? Colon adenomas   ? AGE 52  ? Complete heart block ([place])   ? STJ PPM Dr. [name] 11/24/15  ? Essential hypertension   ? History of pulmonary embolism 2017  ? unprovoked, long term anticoag with apixaban   ? Hyperlipidemia   ? Myoview 03/2021   ? Myoview 4/22: EF 50, no infarct or ischemia; low risk  ? Presence of permanent cardiac pacemaker   ? Type 2 diabetes mellitus ([place])   ? ? ?Past Surgical History:   ?Procedure Laterality Date  ? COLONOSCOPY    ? 3 SIMPLE ADENOMAS, AGE 38  ? COLONOSCOPY N/A 05/24/2018  ? Procedure: COLONOSCOPY;  Surgeon: Danie Binder, MD;  [facility];  Service: Endoscopy;  Laterality: N/A;  1:00pm  ? EP IMPLANTABLE DEVICE N/A 11/24/2015  ? Procedure: Pacemaker Implant;  Surgeon: [name], MD;  Location: [facility];  Service: Cardiovascular;  Laterality: N/A;  ? IR EXCHANGE BILIARY DRAIN  07/27/2021  ? IR EXCHANGE BILIARY DRAIN  08/23/2021  ? IR EXCHANGE BILIARY DRAIN  02/25/2022  ? IR PERC CHOLECYSTOSTOMY  07/08/2021  ? IR PERC CHOLECYSTOSTOMY  11/04/2021  ? IR RADIOLOGIST EVAL & MGMT  09/22/2021  ? 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  ? LEFT HEART CATH AND CORONARY ANGIOGRAPHY N/A 07/02/2021  ? Procedure: LEFT HEART CATH AND CORONARY ANGIOGRAPHY;  Surgeon: Jettie Booze, MD;  Location: [facility];  Service: Cardiovascular;  Laterality: N/A;  ? POLYPECTOMY  05/24/2018  ? Procedure: POLYPECTOMY;  Surgeon: Danie Binder, MD;  [facility];  Service: Endoscopy;;  ascending and hepatic flexure, transverse  ? TEE WITHOUT CARDIOVERSION N/A 08/27/2021  ? Procedure: TRANSESOPHAGEAL ECHOCARDIOGRAM (TEE);  Surgeon: Pixie Casino, MD;  Location: [facility];  Service: Cardiovascular;  Laterality: N/A;  ?  TUBAL LIGATION    ? ? ?Family History  ?Problem Relation Age of Onset  ? Heart failure Mother   ? Hypertension Mother   ? Heart failure Sister   ? Hypertension Father   ? Cancer Sister   ? Dementia Sister   ? Colon cancer Neg Hx   ? Colon polyps Neg Hx   ? ? ?Social History  ? ?Tobacco Use  ? Smoking status: Never  ? Smokeless tobacco: Never  ?Vaping Use  ? Vaping Use: Never used  ?Substance Use Topics  ? Alcohol use: No  ?  Alcohol/week: 0.0 standard drinks  ? Drug use: No  ? ? ?Medications: I have reviewed the patient's current medications. ?Prior to Admission: (Not in a hospital admission)  ?Scheduled: ? insulin aspart  0-9 Units Subcutaneous Q4H  ?  metoprolol succinate  50 mg Oral Daily  ? pantoprazole (PROTONIX) IV  40 mg Intravenous Q24H  ? ?Continuous: ? [START ON 03/14/2022] cefTRIAXone (ROCEPHIN)  IV    ? And  ? metronidazole    ? heparin 1,150 Units/hr (03/13/22 1310)  ? lactated ringers 150 mL/hr at 03/13/22 0817  ? ?VOZ:[id number] **OR** acetaminophen, HYDROmorphone (DILAUDID) injection, ondansetron (ZOFRAN) IV, ondansetron **OR** ondansetron (ZOFRAN) IV ? ?Allergies  ?Allergen Reactions  ? Penicillins Hives, Itching and Other (See Comments)  ?  Has tolerated ceftriaxone 7/22 ?Has patient had a PCN reaction causing immediate rash, facial/tongue/throat swelling, SOB or lightheadedness with hypotension: No ?Has patient had a PCN reaction causing severe rash involving mucus membranes or skin necrosis: Yes ?Has patient had a PCN reaction that required hospitalization No ?Has patient had a PCN reaction occurring within the last 10 years: No ?If all of the above answers are "NO", then may proceed with Cephalosporin use. ?  ? ? ? ?ROS:  ?A comprehensive review of systems was negative except for: Constitutional: positive for fatigue and fevers ?Gastrointestinal: positive for abdominal pain and nausea ? ?Blood pressure 139/71, pulse 86, temperature 99.6 ?F (37.6 ?C), temperature source Oral, resp. rate 17, height '5\' 4"'$  (1.626 m), weight 80.1 kg, SpO2 96 %. ?Physical Exam ?Vitals reviewed.  ?Constitutional:   ?   Appearance: She is well-developed.  ?HENT:  ?   Head: Normocephalic.  ?Eyes:  ?   Extraocular Movements: Extraocular movements intact.  ?Cardiovascular:  ?   Rate and Rhythm: Normal rate.  ?Pulmonary:  ?   Effort: Pulmonary effort is normal.  ?Abdominal:  ?   Palpations: Abdomen is soft.  ?   Comments: IR  drain with bilious output  ?Skin: ?   General: Skin is warm.  ?Neurological:  ?   General: No focal deficit present.  ?   Mental Status: She is alert.  ?Psychiatric:     ?   Mood and Affect: Mood normal.  ? ? ?Results: ?Results for orders placed or  performed during the hospital encounter of 03/13/22 (from the past 48 hour(s))  ?Comprehensive metabolic panel     Status: Abnormal  ? Collection Time: 03/13/22  1:15 AM  ?Result Value Ref Range  ? Sodium 137 135 - 145 mmol/L  ? Potassium 3.7 3.5 - 5.1 mmol/L  ? Chloride 98 98 - 111 mmol/L  ? CO2 27 22 - 32 mmol/L  ? Glucose, Bld 225 (H) 70 - 99 mg/dL  ?  Comment: Glucose reference range applies only to samples taken after fasting for at least 8 hours.  ? BUN 17 8 - 23 mg/dL  ? Creatinine, Ser 1.09 (H)  0.44 - 1.00 mg/dL  ? Calcium 8.6 (L) 8.9 - 10.3 mg/dL  ? Total Protein 7.7 6.5 - 8.1 g/dL  ? Albumin 2.7 (L) 3.5 - 5.0 g/dL  ? AST 138 (H) 15 - 41 U/L  ? ALT 55 (H) 0 - 44 U/L  ? Alkaline Phosphatase 154 (H) 38 - 126 U/L  ? Total Bilirubin 1.6 (H) 0.3 - 1.2 mg/dL  ? GFR, Estimated 51 (L) >60 mL/min  ?  Comment: (NOTE) ?Calculated using the CKD-EPI Creatinine Equation (2021) ?  ? Anion gap 12 5 - 15  ?  Comment: Performed at [facility], [address], [place], [place] [address]  ?Lactic acid, plasma     Status: None  ? Collection Time: 03/13/22  1:15 AM  ?Result Value Ref Range  ? Lactic Acid, Venous 1.9 0.5 - 1.9 mmol/L  ?  Comment: Performed at [facility], [address], [place], [place] [address]  ?CBC with Differential     Status: Abnormal  ? Collection Time: 03/13/22  1:15 AM  ?Result Value Ref Range  ? WBC 11.3 (H) 4.0 - 10.5 K/uL  ? RBC 3.84 (L) 3.87 - 5.11 MIL/uL  ? Hemoglobin 11.1 (L) 12.0 - 15.0 g/dL  ? HCT 36.2 36.0 - 46.0 %  ? MCV 94.3 80.0 - 100.0 fL  ? MCH 28.9 26.0 - 34.0 pg  ? MCHC 30.7 30.0 - 36.0 g/dL  ? RDW 13.4 11.5 - 15.5 %  ? Platelets 188 150 - 400 K/uL  ? nRBC 0.0 0.0 - 0.2 %  ? Neutrophils Relative % 85 %  ? Neutro Abs 9.6 (H) 1.7 - 7.7 K/uL  ? Lymphocytes Relative 8 %  ? Lymphs Abs 0.9 0.7 - 4.0 K/uL  ? Monocytes Relative 6 %  ? Monocytes Absolute 0.7 0.1 - 1.0 K/uL  ? Eosinophils Relative 0 %  ? Eosinophils Absolute 0.0 0.0 - 0.5 K/uL  ? Basophils Relative 0 %  ? Basophils Absolute 0.0  0.0 - 0.1 K/uL  ? Immature Granulocytes 1 %  ? Abs Immature Granulocytes 0.10 (H) 0.00 - 0.07 K/uL  ?  Comment: Performed at [facility], [address], [place], [place] [address]  ?Protime-INR     Status: Abnormal

## 2022-03-13 NOTE — Progress Notes (Signed)
ANTICOAGULATION CONSULT NOTE -  ? ?Pharmacy Consult for heparin ?Indication: hx of pulmonary embolus ? ?Allergies  ?Allergen Reactions  ? Penicillins Hives, Itching and Other (See Comments)  ?  Has tolerated ceftriaxone 7/22 ?Has patient had a PCN reaction causing immediate rash, facial/tongue/throat swelling, SOB or lightheadedness with hypotension: No ?Has patient had a PCN reaction causing severe rash involving mucus membranes or skin necrosis: Yes ?Has patient had a PCN reaction that required hospitalization No ?Has patient had a PCN reaction occurring within the last 10 years: No ?If all of the above answers are "NO", then may proceed with Cephalosporin use. ?  ? ? ?Patient Measurements: ?Height: '5\' 4"'$  (162.6 cm) ?Weight: 80.1 kg (176 lb 8 oz) ?IBW/kg (Calculated) : 54.7 ?Heparin Dosing Weight: 74 kg ? ?Vital Signs: ?Temp: 98.8 ?F (37.1 ?C) (03/19 1711) ?Temp Source: Oral (03/19 1711) ?BP: 143/52 (03/19 1711) ?Pulse Rate: 73 (03/19 1711) ? ?Labs: ?Recent Labs  ?  03/13/22 ?0115 03/13/22 ?2024  ?HGB 11.1*  --   ?HCT 36.2  --   ?PLT 188  --   ?APTT  --  68*  ?LABPROT 19.6*  --   ?INR 1.7*  --   ?CREATININE 1.09*  --   ? ? ? ?Estimated Creatinine Clearance: 42.2 mL/min (A) (by C-G formula based on SCr of 1.09 mg/dL (H)). ? ? ?Medical History: ?Past Medical History:  ?Diagnosis Date  ? Acid reflux   ? Arthritis   ? Colon adenomas   ? AGE 38  ? Complete heart block ([place])   ? STJ PPM Dr. [name] 11/24/15  ? Essential hypertension   ? History of pulmonary embolism 2017  ? unprovoked, long term anticoag with apixaban   ? Hyperlipidemia   ? Myoview 03/2021   ? Myoview 4/22: EF 50, no infarct or ischemia; low risk  ? Presence of permanent cardiac pacemaker   ? Type 2 diabetes mellitus ([place])   ? ? ?Medications:  ?Medications Prior to Admission  ?Medication Sig Dispense Refill Last Dose  ? apixaban (ELIQUIS) 5 MG TABS tablet Take 1 tablet (5 mg total) by mouth 2 (two) times daily. 60 tablet 0   ? atorvastatin (LIPITOR) 40 MG  tablet Take 1 tablet (40 mg total) by mouth daily. 30 tablet 0   ? cyanocobalamin (,VITAMIN B-12,) 1000 MCG/ML injection Inject 1 mL (1,000 mcg total) into the muscle once a week for 28 days. 4 mL 0   ? furosemide (LASIX) 40 MG tablet Take 1 tablet (40 mg total) by mouth daily. 30 tablet 0   ? glucose blood test strip Use to check blood glucose fasting , before lunch and dinner and after your largest meal 120 each 0   ? Insulin Pen Needle (BD AUTOSHIELD DUO) 30G X 5 MM MISC 30 gauge x 3/16" ?(pen needle,diabetic dual safty)     ? Lancets (ONETOUCH ULTRASOFT) lancets Use to check glucose 4 x daily 120 each 0   ? LANTUS SOLOSTAR 100 UNIT/ML Solostar Pen Inject 10 Units into the skin at bedtime. 15 mL 0   ? metFORMIN (GLUCOPHAGE) 1000 MG tablet TAKE (1) TABLET BY MOUTH TWICE DAILY. 60 tablet 0   ? metoprolol succinate (TOPROL-XL) 50 MG 24 hr tablet Take 1 tablet (50 mg total) by mouth daily. Take with or immediately following a meal. 60 tablet 0   ? nitroGLYCERIN (NITROSTAT) 0.4 MG SL tablet Place 1 tablet (0.4 mg total) under the tongue every 5 (five) minutes as needed for chest pain. 25  tablet 0   ? polyethylene glycol (MIRALAX / GLYCOLAX) 17 g packet Take 17 g by mouth daily as needed for mild constipation. 14 each 0   ? ? ?Assessment: ?Pharmacy consulted to dose heparin for patient with history of pulmonary embolism 2017.  Patient is on apixaban prior to admission with last dose 3/18 unknown time.  Will need to monitor based on aPTT until correlation with heparin levels. ? ?CBC WNL ?aPTT 68- therapeutic ? ?Goal of Therapy:  ?Heparin level 0.3-0.7 units/ml ?aPTT 66-102 seconds ?Monitor platelets by anticoagulation protocol: Yes ?  ?Plan:  ?Continue heparin infusion at 1150 units/hr ?Check anti-Xa level/aPTT in 8 hours and daily. ?Continue to monitor H&H and platelets. ? ? ?Margot Ables, PharmD ?Clinical Pharmacist ?03/13/2022 9:02 PM ? ? ?

## 2022-03-13 NOTE — Progress Notes (Signed)
Worsening of her cholecystitis. Chronic cholecystitis with perc tube. Over past couple of day, fever and vomiting. Ct not showing much, but the labs are indicative of cholecystitis and hepatic etiology. She was treated for sepsis in the Ed. Lipase is still pending.

## 2022-03-13 NOTE — ED Notes (Signed)
Flushed cholecystectomy tube per Dr. Manuella Ghazi. Tube flushed and drained well. ?

## 2022-03-13 NOTE — ED Notes (Signed)
In and out cath done with urine obtained and sent to lab  ?

## 2022-03-14 DIAGNOSIS — K851 Biliary acute pancreatitis without necrosis or infection: Secondary | ICD-10-CM

## 2022-03-14 DIAGNOSIS — K859 Acute pancreatitis without necrosis or infection, unspecified: Secondary | ICD-10-CM | POA: Diagnosis not present

## 2022-03-14 DIAGNOSIS — Z86711 Personal history of pulmonary embolism: Secondary | ICD-10-CM | POA: Diagnosis not present

## 2022-03-14 DIAGNOSIS — Z950 Presence of cardiac pacemaker: Secondary | ICD-10-CM

## 2022-03-14 DIAGNOSIS — E876 Hypokalemia: Secondary | ICD-10-CM | POA: Diagnosis present

## 2022-03-14 DIAGNOSIS — E860 Dehydration: Secondary | ICD-10-CM | POA: Diagnosis not present

## 2022-03-14 DIAGNOSIS — Z01810 Encounter for preprocedural cardiovascular examination: Secondary | ICD-10-CM

## 2022-03-14 DIAGNOSIS — K812 Acute cholecystitis with chronic cholecystitis: Secondary | ICD-10-CM | POA: Diagnosis not present

## 2022-03-14 LAB — CBC
HCT: 27.4 % — ABNORMAL LOW (ref 36.0–46.0)
Hemoglobin: 8.5 g/dL — ABNORMAL LOW (ref 12.0–15.0)
MCH: 29.9 pg (ref 26.0–34.0)
MCHC: 31 g/dL (ref 30.0–36.0)
MCV: 96.5 fL (ref 80.0–100.0)
Platelets: 168 10*3/uL (ref 150–400)
RBC: 2.84 MIL/uL — ABNORMAL LOW (ref 3.87–5.11)
RDW: 13.7 % (ref 11.5–15.5)
WBC: 6.4 10*3/uL (ref 4.0–10.5)
nRBC: 0 % (ref 0.0–0.2)

## 2022-03-14 LAB — COMPREHENSIVE METABOLIC PANEL
ALT: 28 U/L (ref 0–44)
AST: 32 U/L (ref 15–41)
Albumin: 1.9 g/dL — ABNORMAL LOW (ref 3.5–5.0)
Alkaline Phosphatase: 83 U/L (ref 38–126)
Anion gap: 9 (ref 5–15)
BUN: 9 mg/dL (ref 8–23)
CO2: 25 mmol/L (ref 22–32)
Calcium: 7.7 mg/dL — ABNORMAL LOW (ref 8.9–10.3)
Chloride: 106 mmol/L (ref 98–111)
Creatinine, Ser: 0.73 mg/dL (ref 0.44–1.00)
GFR, Estimated: >60 mL/min (ref >60)
Glucose, Bld: 122 mg/dL — ABNORMAL HIGH (ref 70–99)
Potassium: 3.2 mmol/L — ABNORMAL LOW (ref 3.5–5.1)
Sodium: 140 mmol/L (ref 135–145)
Total Bilirubin: 0.3 mg/dL (ref 0.3–1.2)
Total Protein: 5.7 g/dL — ABNORMAL LOW (ref 6.5–8.1)

## 2022-03-14 LAB — GLUCOSE, CAPILLARY
Glucose-Capillary: 110 mg/dL — ABNORMAL HIGH (ref 70–99)
Glucose-Capillary: 127 mg/dL — ABNORMAL HIGH (ref 70–99)
Glucose-Capillary: 101 mg/dL — ABNORMAL HIGH (ref 70–99)
Glucose-Capillary: 138 mg/dL — ABNORMAL HIGH (ref 70–99)
Glucose-Capillary: 146 mg/dL — ABNORMAL HIGH (ref 70–99)
Glucose-Capillary: 124 mg/dL — ABNORMAL HIGH (ref 70–99)

## 2022-03-14 LAB — HEPARIN LEVEL (UNFRACTIONATED): Heparin Unfractionated: >1.1 IU/mL — ABNORMAL HIGH (ref 0.30–0.70)

## 2022-03-14 LAB — LIPASE, BLOOD: Lipase: 58 U/L — ABNORMAL HIGH (ref 11–51)

## 2022-03-14 LAB — APTT: aPTT: 100 seconds — ABNORMAL HIGH (ref 24–36)

## 2022-03-14 LAB — MAGNESIUM: Magnesium: 1.5 mg/dL — ABNORMAL LOW (ref 1.7–2.4)

## 2022-03-14 MED ORDER — POTASSIUM CHLORIDE 10 MEQ/100ML IV SOLN
10.0000 meq | INTRAVENOUS | Status: AC
  Administered 2022-03-14 (×4): 10 meq via INTRAVENOUS
  Filled 2022-03-14 (×2): qty 100

## 2022-03-14 MED ORDER — INSULIN ASPART 100 UNIT/ML IJ SOLN
0.0000 [IU] | Freq: Three times a day (TID) | INTRAMUSCULAR | Status: DC
  Administered 2022-03-14 – 2022-03-15 (×2): 1 [IU] via SUBCUTANEOUS
  Administered 2022-03-15: 2 [IU] via SUBCUTANEOUS
  Administered 2022-03-15 – 2022-03-16 (×2): 1 [IU] via SUBCUTANEOUS
  Administered 2022-03-16: 2 [IU] via SUBCUTANEOUS
  Administered 2022-03-17: 5 [IU] via SUBCUTANEOUS
  Administered 2022-03-17: 2 [IU] via SUBCUTANEOUS
  Administered 2022-03-17: 3 [IU] via SUBCUTANEOUS
  Administered 2022-03-18 (×2): 2 [IU] via SUBCUTANEOUS

## 2022-03-14 MED ORDER — HYDROMORPHONE HCL 1 MG/ML IJ SOLN: 0.5000 mg | INTRAMUSCULAR | Status: DC | PRN

## 2022-03-14 MED ORDER — MAGNESIUM SULFATE 4 GM/100ML IV SOLN
4.0000 g | Freq: Once | INTRAVENOUS | Status: AC
  Administered 2022-03-14: 4 g via INTRAVENOUS
  Filled 2022-03-14: qty 100

## 2022-03-14 MED ORDER — ADULT MULTIVITAMIN W/MINERALS CH
1.0000 | ORAL_TABLET | Freq: Every day | ORAL | Status: DC
  Administered 2022-03-14 – 2022-03-18 (×4): 1 via ORAL
  Filled 2022-03-14 (×5): qty 1

## 2022-03-14 NOTE — Consult Note (Addendum)
?Cardiology Consultation:  ? ?Patient ID: Natalie Castro ?MRN: [id number]; DOB: 10-20-1941 ? ?Admit date: 03/13/2022 ?Date of Consult: 03/14/2022 ? ?PCP:  Redmond School, MD ?  ?[facility] Providers ?Cardiologist:  Cristopher Peru, MD  ?Electrophysiologist:  Cristopher Peru, MD     ? ? ?Patient Profile:  ? ?Natalie Castro is a 81 y.o. female with a hx of PPM, chronic diastolic CHF, remote PE who is being seen 03/14/2022 for the evaluation of preop clearance for ERCP at the request of Dr. Constance Haw. ? ?History of Present Illness:  ? 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 Has had cholecystostomy tube since last year due to cholecystitis and has had recurrent sepsis. Admitted with nausea, vomiting, and recurrent sepsis. ? ?Was last seen by Dr. Lovena Le 10/12/21 and doing well. Remote Pacer check 02/17/22 normal. Patient denies any cardiac complaints. She has been getting PT at home since last hospitalization for sepsis. Can walk with walker, go up stairs, do light housework. No chest pain, dyspnea, edema, dizziness or presyncope.  ? ? ?Past Medical History:  ?Diagnosis Date  ? Acid reflux   ? Arthritis   ? Colon adenomas   ? AGE 5  ? Complete heart block ([place])   ? STJ PPM Dr. [name] 11/24/15  ? Essential hypertension   ? History of pulmonary embolism 2017  ? unprovoked, long term anticoag with apixaban   ? Hyperlipidemia   ? Myoview 03/2021   ? Myoview 4/22: EF 50, no infarct or ischemia; low risk  ? Presence of permanent cardiac pacemaker   ? Type 2 diabetes mellitus ([place])   ? ? ?Past Surgical History:  ?Procedure Laterality Date  ? COLONOSCOPY    ? 3 SIMPLE ADENOMAS, AGE 75  ? COLONOSCOPY N/A 05/24/2018  ? Procedure: COLONOSCOPY;  Surgeon: Danie Binder, MD;  [facility];  Service: Endoscopy;  Laterality: N/A;  1:00pm  ? EP IMPLANTABLE DEVICE N/A 11/24/2015  ? Procedure: Pacemaker Implant;  Surgeon: [name], MD;  Location: [facility];   Service: Cardiovascular;  Laterality: N/A;  ? IR EXCHANGE BILIARY DRAIN  07/27/2021  ? IR EXCHANGE BILIARY DRAIN  08/23/2021  ? IR EXCHANGE BILIARY DRAIN  02/25/2022  ? IR PERC CHOLECYSTOSTOMY  07/08/2021  ? IR PERC CHOLECYSTOSTOMY  11/04/2021  ? IR RADIOLOGIST EVAL & MGMT  09/22/2021  ? 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  ? LEFT HEART CATH AND CORONARY ANGIOGRAPHY N/A 07/02/2021  ? Procedure: LEFT HEART CATH AND CORONARY ANGIOGRAPHY;  Surgeon: Jettie Booze, MD;  Location: [facility];  Service: Cardiovascular;  Laterality: N/A;  ? POLYPECTOMY  05/24/2018  ? Procedure: POLYPECTOMY;  Surgeon: Danie Binder, MD;  [facility]: [facility];  Service: Endoscopy;;  ascending and hepatic flexure, transverse  ? TEE WITHOUT CARDIOVERSION N/A 08/27/2021  ? Procedure: TRANSESOPHAGEAL ECHOCARDIOGRAM (TEE);  Surgeon: Pixie Casino, MD;  Location: [facility];  Service: Cardiovascular;  Laterality: N/A;  ? TUBAL LIGATION    ?  ? ?Home Medications:  ?Prior to Admission medications   ?Medication Sig Start Date End Date Taking? Authorizing Provider  ?apixaban (ELIQUIS) 5 MG TABS tablet Take 1 tablet (5 mg total) by mouth 2 (two) times daily. 02/17/22  Yes Gerlene Fee, NP  ?atorvastatin (LIPITOR) 40 MG tablet Take 1 tablet (40 mg total) by mouth daily. 02/17/22  Yes Gerlene Fee, NP  ?  furosemide (LASIX) 40 MG tablet Take 1 tablet (40 mg total) by mouth daily. 02/17/22  Yes Gerlene Fee, NP  ?LANTUS SOLOSTAR 100 UNIT/ML Solostar Pen Inject 10 Units into the skin at bedtime. 02/17/22  Yes Gerlene Fee, NP  ?metFORMIN (GLUCOPHAGE) 1000 MG tablet TAKE (1) TABLET BY MOUTH TWICE DAILY. 02/17/22  Yes Gerlene Fee, NP  ?metoprolol succinate (TOPROL-XL) 50 MG 24 hr tablet Take 1 tablet (50 mg total) by mouth daily. Take with or immediately following a meal. 02/17/22  Yes Gerlene Fee, NP  ?nitroGLYCERIN (NITROSTAT) 0.4 MG SL tablet Place 1 tablet (0.4 mg total) under the tongue every 5 (five) minutes as needed for  chest pain. 02/17/22 05/18/22 Yes Gerlene Fee, NP  ?cyanocobalamin (,VITAMIN B-12,) 1000 MCG/ML injection Inject 1 mL (1,000 mcg total) into the muscle once a week for 28 days. ?Patient not taking: Reported on 03/14/2022 02/17/22 03/17/22  Gerlene Fee, NP  ?glucose blood test strip Use to check blood glucose fasting , before lunch and dinner and after your largest meal 02/17/22   Gerlene Fee, NP  ?Insulin Pen Needle (BD AUTOSHIELD DUO) 30G X 5 MM MISC 30 gauge x 3/16" ?(pen needle,diabetic dual safty)    [provider]  ?Lancets (ONETOUCH ULTRASOFT) lancets Use to check glucose 4 x daily 02/17/22   Gerlene Fee, NP  ?polyethylene glycol (MIRALAX / GLYCOLAX) 17 g packet Take 17 g by mouth daily as needed for mild constipation. ?Patient not taking: Reported on 03/14/2022 08/28/21   Cristal Deer, MD  ? ? ?Inpatient Medications: ?Scheduled Meds: ? feeding supplement  1 Container Oral TID BM  ? insulin aspart  0-9 Units Subcutaneous Q4H  ? metoprolol succinate  50 mg Oral Daily  ? pantoprazole (PROTONIX) IV  40 mg Intravenous Q24H  ? ?Continuous Infusions: ? cefTRIAXone (ROCEPHIN)  IV 2 g (03/14/22 0523)  ? And  ? metronidazole 500 mg (03/14/22 0850)  ? heparin 1,150 Units/hr (03/13/22 1310)  ? lactated ringers 150 mL/hr at 03/14/22 6063  ? ?PRN Meds: ?acetaminophen **OR** acetaminophen, HYDROmorphone (DILAUDID) injection, ondansetron (ZOFRAN) IV, ondansetron **OR** ondansetron (ZOFRAN) IV ? ?Allergies:    ?Allergies  ?Allergen Reactions  ? Penicillins Hives, Itching and Other (See Comments)  ?  Has tolerated ceftriaxone 7/22 ?Has patient had a PCN reaction causing immediate rash, facial/tongue/throat swelling, SOB or lightheadedness with hypotension: No ?Has patient had a PCN reaction causing severe rash involving mucus membranes or skin necrosis: Yes ?Has patient had a PCN reaction that required hospitalization No ?Has patient had a PCN reaction occurring within the last 10 years: No ?If all of  the above answers are "NO", then may proceed with Cephalosporin use. ?  ? ? ?Social History:   ?Social History  ? ?Tobacco Use  ? Smoking status: Never  ? Smokeless tobacco: Never  ?Substance Use Topics  ? Alcohol use: No  ?  Alcohol/week: 0.0 standard drinks  ?  ? ?Family History:   ?  ?Family History  ?Problem Relation Age of Onset  ? Heart failure Mother   ? Hypertension Mother   ? Heart failure Sister   ? Hypertension Father   ? Cancer Sister   ? Dementia Sister   ? Colon cancer Neg Hx   ? Colon polyps Neg Hx   ?  ? ?ROS:  ?Please see the history of present illness.  ?Review of Systems  ?Constitutional: Positive for fever.  ?HENT: Negative.    ?Eyes: Negative.   ?  Cardiovascular: Negative.   ?Respiratory: Negative.    ?Hematologic/Lymphatic: Negative.   ?Musculoskeletal: Negative.  Negative for joint pain.  ?Gastrointestinal:  Positive for anorexia, nausea and vomiting.  ?Genitourinary: Negative.   ?Neurological:  Positive for weakness.  ? ?All other ROS reviewed and negative.    ? ?Physical Exam/Data:  ? ?Vitals:  ? 03/13/22 1711 03/13/22 2211 03/14/22 1031 03/14/22 5945  ?BP: (!) 143/52 (!) 125/54 (!) 143/59 (!) 149/65  ?Pulse: 73 83 76 77  ?Resp:  '19 19 18  '$ ?Temp: 98.8 ?F (37.1 ?C) 98.5 ?F (36.9 ?C) 98.9 ?F (37.2 ?C) 99.2 ?F (37.3 ?C)  ?TempSrc: Oral Oral Oral Oral  ?SpO2: 99% 97% 98% 96%  ?Weight:      ?Height:      ? ? ?Intake/Output Summary (Last 24 hours) at 03/14/2022 0924 ?Last data filed at 03/14/2022 0400 ?Gross per 24 hour  ?Intake 2989.89 ml  ?Output 125 ml  ?Net 2864.89 ml  ? ?Last 3 Weights 03/13/2022 02/24/2022 02/18/2022  ?Weight (lbs) 176 lb 8 oz 191 lb 199 lb  ?Weight (kg) 80.06 kg 86.637 kg 90.266 kg  ?   ?Body mass index is 30.3 kg/m?.  ?General:  Well nourished, well developed, in no acute distress  ?HEENT: normal ?Neck: no JVD ?Vascular: No carotid bruits; Distal pulses 2+ bilaterally ?Cardiac:  normal S1, S2; RRR; 1/6 systolic murmur LSB ?Lungs:  clear to auscultation bilaterally, no wheezing,  rhonchi or rales  ?Abd: soft, nontender, no hepatomegaly  ?Ext: no edema ?Musculoskeletal:  No deformities, BUE and BLE strength normal and equal ?Skin: warm and dry  ?Neuro:  CNs 2-12 intact, no focal

## 2022-03-14 NOTE — Progress Notes (Signed)
?PROGRESS NOTE ? ? ?Natalie Castro  VPX:[id number] DOB: [birth year] DOA: 03/13/2022 ?PCP: Redmond School, MD  ? ?Chief Complaint  ?Patient presents with  ? Emesis  ? Abdominal Pain  ? ?Level of care: Telemetry ? ?Brief Admission History:  ?81 y.o. female with medical history significant of CAD, type 2 diabetes, complete heart block status post permanent pacemaker, hypertension, dyslipidemia, pulmonary embolus on apixaban, chronic diastolic CHF, and chronic cholecystitis with percutaneous drain who presented to the ED with worsening generalized abdominal pain as well as nausea and vomiting that appears to have began in the last 1 day.  She was noted to have a fever as well.  Upon chart review, it appears that she was supposed to have her percutaneous cholecystostomy drain replaced on 3/3 due to the fact that it was dysfunctional, it does not appear to have been done.  Patient is quite lethargic at this time and cannot give any further history. ?  ?In the ED, CT scan of the abdomen and pelvis was performed with no acute process mentioned.  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  Cholecystostomy drain appears to be in good position.  She is noted to have significant LFT elevation and lipase is over 9000.  She has been started on 2 L fluid bolus and has been given Rocephin and Flagyl. ? ?03/14/2022:  Pt feeling much better.  Lipase way down to 51. Tolerating clear liquid diet.  Cardiology preop eval reporting intermediate risk for surgery.  Awaiting anesthesia team to give opinion.  ?  ?Assessment and Plan: ?Sepsis ([place]) ?Sepsis physiology has resolved with supportive measures.  ? ?Transaminitis ?LFTs are normalized. Lipase is down to 51.  ? ?Acute on chronic cholecystitis ?Surgery and GI is involved.  ?In discussion for possible ERCP on 3/21.  ? ?Pancreatic duct dilated ?Possible ERCP on 3/21 with Dr. Laural Golden.  ? ?Hypokalemia ?IV replacement ordered.  ?Magnesium replacement  ordered ?Recheck in AM ? 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  ? ?CBG (last 3)  ?Recent Labs  ?  03/14/22 ?4627 03/14/22 ?0350 03/14/22 ?1101  ?GLUCAP 127* 101* 138*  ? ? ? ?Hypomagnesemia ?IV replacement ordered 3/20 ?Recheck in AM.   ? ?History of pulmonary embolus (PE) ?She is on longterm full anticoagulation.  ?Apixaban on hold.  IV heparin dosed per pharm D ? ?Pacemaker ?Pt was evaluated by cardiology and noted that pacemaker has been functioning normally.  ? ?DVT prophylaxis: IV heparin infusion  ?Code Status: Full  ?Family Communication:  ?Disposition: Status is: Inpatient ?Remains inpatient appropriate because: IV heparin infusion, IV fluids ?  ?Consultants:  ?GI ?Surgery ?Cardiology ?Anesthesia  ?Procedures:  ?TBD ?Antimicrobials:  ?Ceftriaxone 3/19>> ?Metronidazole 3/19>>  ?Subjective: ?Pt sitting up in chair, says she is feeling much better today. Tolerating diet.  Pain much improved.  ?Objective: ?Vitals:  ? 03/13/22 2211 03/14/22 0213 03/14/22 0625 03/14/22 1225  ?BP: (!) 125/54 (!) 143/59 (!) 149/65 (!) 153/81  ?Pulse: 83 76 77 74  ?Resp: '19 19 18 17  '$ ?Temp: 98.5 ?F (36.9 ?C) 98.9 ?F (37.2 ?C) 99.2 ?F (37.3 ?C) 99.2 ?F (37.3 ?C)  ?TempSrc: Oral Oral Oral Oral  ?SpO2: 97% 98% 96% 99%  ?Weight:      ?Height:      ? ? ?Intake/Output Summary (Last 24 hours) at 03/14/2022 1245 ?Last data filed at 03/14/2022 0900 ?Gross per 24 hour  ?Intake 2349.89 ml  ?  Output 800 ml  ?Net 1549.89 ml  ? ?Filed Weights  ? 03/13/22 1100  ?Weight: 80.1 kg  ? ?Examination: ? ?General exam: awake, alert, sitting up in chair, NAD, Appears calm and comfortable  ?Respiratory system: Clear to auscultation. Respiratory effort normal. ?Cardiovascular system: normal S1 & S2 heard. No JVD, murmurs, rubs, gallops or clicks. No pedal edema. ?Gastrointestinal system: Abdomen is nondistended, soft and mild tenderness RUQ. No organomegaly or masses felt. Normal bowel sounds  heard. ?Central nervous system: Alert and oriented. No focal neurological deficits. ?Extremities: Symmetric 5 x 5 power. ?Skin: No rashes, lesions or ulcers. ?Psychiatry: Judgement and insight appear normal. Mood & affect appropriate.  ? ?Data Reviewed: I have personally reviewed following labs and imaging studies ? ?CBC: ?Recent Labs  ?Lab 03/13/22 ?0115 03/14/22 ?9937  ?WBC 11.3* 6.4  ?NEUTROABS 9.6*  --   ?HGB 11.1* 8.5*  ?HCT 36.2 27.4*  ?MCV 94.3 96.5  ?PLT 188 168  ? ? ?Basic Metabolic Panel: ?Recent Labs  ?Lab 03/13/22 ?0115 03/14/22 ?1696  ?NA 137 140  ?K 3.7 3.2*  ?CL 98 106  ?CO2 27 25  ?GLUCOSE 225* 122*  ?BUN 17 9  ?CREATININE 1.09* 0.73  ?CALCIUM 8.6* 7.7*  ?MG  --  1.5*  ? ? ?CBG: ?Recent Labs  ?Lab 03/13/22 ?2209 03/14/22 ?0214 03/14/22 ?7893 03/14/22 ?8101 03/14/22 ?1101  ?GLUCAP 150* 110* 127* 101* 138*  ? ? ?Recent Results (from the past 240 hour(s))  ?Culture, blood (Routine x 2)     Status: Abnormal (Preliminary result)  ? Collection Time: 03/13/22  1:15 AM  ? Specimen: BLOOD RIGHT WRIST  ?Result Value Ref Range Status  ? Specimen Description   Final  ?  BLOOD RIGHT WRIST BOTTLES DRAWN AEROBIC AND ANAEROBIC ?Performed at [facility], [address], [place], [place] [address] ?  ? Special Requests   Final  ?  Blood Culture results may not be optimal due to an inadequate volume of blood received in culture bottles ?Performed at [facility], [address], [place], [place] [address] ?  ? Culture  Setup Time   Final  ?  GRAM NEGATIVE RODS ANAEROBIC BOTTLE ONLY Gram Stain Report Called to,Read Back By and Verified With: A.GOFF @ 1330 03/13/22 BY STEPHTR ?CRITICAL RESULT CALLED TO, READ BACK BY AND VERIFIED WITH: RN A.CRADDOCK ON [id number] AT 6144 BY E.PARRISH ?  ? Culture (A)  Final  ?  ESCHERICHIA COLI ?SUSCEPTIBILITIES TO FOLLOW ?Performed at [facility], [place] [address] [place], [place] [address] ?  ? Report Status PENDING  Incomplete  ?Culture, blood (Routine x 2)     Status: None  (Preliminary result)  ? Collection Time: 03/13/22  1:15 AM  ? Specimen: Right Antecubital; Blood  ?Result Value Ref Range Status  ? Specimen Description   Final  ?  RIGHT ANTECUBITAL BOTTLES DRAWN AEROBIC AND ANAEROBIC  ? Special Requests Blood Culture adequate volume  Final  ? Culture   Final  ?  NO GROWTH < 24 HOURS ?Performed at [facility], [address] [place], [place] [address] ?  ? Report Status PENDING  Incomplete  ?Blood Culture ID Panel (Reflexed)     Status: Abnormal  ? Collection Time: 03/13/22  1:15 AM  ?Result Value Ref Range Status  ? Enterococcus faecalis NOT DETECTED NOT DETECTED Final  ? Enterococcus Faecium NOT DETECTED NOT DETECTED Final  ? Listeria monocytogenes NOT DETECTED NOT DETECTED Final  ? Staphylococcus species NOT DETECTED NOT DETECTED Final  ?  Staphylococcus aureus (BCID) NOT DETECTED NOT DETECTED Final  ? Staphylococcus epidermidis NOT DETECTED NOT DETECTED Final  ? Staphylococcus lugdunensis NOT DETECTED NOT DETECTED Final  ? Streptococcus species NOT DETECTED NOT DETECTED Final  ? Streptococcus agalactiae NOT DETECTED NOT DETECTED Final  ? Streptococcus pneumoniae NOT DETECTED NOT DETECTED Final  ? Streptococcus pyogenes NOT DETECTED NOT DETECTED Final  ? A.calcoaceticus-baumannii NOT DETECTED NOT DETECTED Final  ? Bacteroides fragilis NOT DETECTED NOT DETECTED Final  ? Enterobacterales DETECTED (A) NOT DETECTED Final  ?  Comment: Enterobacterales represent a large order of gram negative bacteria, not a single organism. ?CRITICAL RESULT CALLED TO, READ BACK BY AND VERIFIED WITH: ?RN A.CRADDOCK ON [id number] AT 3646 BY E.PARRISH ?  ? Enterobacter cloacae complex NOT DETECTED NOT DETECTED Final  ? Escherichia coli DETECTED (A) NOT DETECTED Final  ?  Comment: CRITICAL RESULT CALLED TO, READ BACK BY AND VERIFIED WITH: ?RN A.CRADDOCK ON [id number] AT 8250 BY E.PARRISH ?  ? Klebsiella aerogenes NOT DETECTED NOT DETECTED Final  ? Klebsiella oxytoca NOT DETECTED NOT DETECTED Final  ?  Klebsiella pneumoniae NOT DETECTED NOT DETECTED Final  ? Proteus species NOT DETECTED NOT DETECTED Final  ? Salmonella species NOT DETECTED NOT DETECTED Final  ? Serratia marcescens NOT DETECTED NOT DETECTED Final  ? Haemophilus influenz

## 2022-03-14 NOTE — Progress Notes (Addendum)
? ?Gastroenterology Progress Note  ? ?Referring Provider: No ref. provider found ?Primary Care Physician:  Redmond School, MD ?Primary Gastroenterologist:  formerly [name] ? ?Patient ID: Natalie Castro; [id number]; 1941-06-09  ? ? ?Subjective  ? ?Patient reports she has not liked her breakfast or lunch options today. She is not a fan of the beef broth and feels like certain things bother her more than others. She has been able to drink her liquids and eat her jello however does report some abdominal discomfort with eating, put no real pain. She reported some nausea but was relieved with medications and has some early satiety. Denies dysphagia, vomiting, constipation, or diarrhea. Daughter at bedside.  ? ? ?Objective  ? ?Vital signs in last 24 hours ?Temp:  [98.5 ?F (36.9 ?C)-99.2 ?F (37.3 ?C)] 99.2 ?F (37.3 ?C) (03/20 0539) ?Pulse Rate:  [57-86] 77 (03/20 0625) ?Resp:  [14-24] 18 (03/20 7673) ?BP: (123-149)/(51-78) 149/65 (03/20 4193) ?SpO2:  [90 %-100 %] 96 % (03/20 0625) ?Weight:  [80.1 kg] 80.1 kg (03/19 1100) ?Last BM Date : 03/12/22 ? ?Physical Exam ?General:   Alert and oriented, pleasant ?Head:  Normocephalic and atraumatic. ?Eyes:  No icterus, sclera clear. Conjuctiva pink. ?Heart:  S1, S2 present, no murmurs noted.  ?Lungs: Clear to auscultation bilaterally, without wheezing, rales, or rhonchi.  ?Abdomen: mild TTP to LUQ and epigastric region. Bowel sounds present, soft, non-distended. No HSM or hernias noted. No rebound or guarding. No masses appreciated  ?Msk:  Symmetrical without gross deformities. Normal posture. ?Pulses:  Normal pulses noted. ?Extremities:  Without clubbing or edema. ?Neurologic:  Alert and  oriented x4;  grossly normal neurologically. ?Skin:  Warm and dry, intact without significant lesions.  ?Psych:  Alert and cooperative. Normal mood and affect. ? ?Intake/Output from previous day: ?03/19 0701 - 03/20 0700 ?In: 2989.9 [P.O.:640; I.V.:1166.2; IV Piggyback:1103.7] ?Out: 125   ?Intake/Output this shift: ?No intake/output data recorded. ? ?Lab Results ? ?Recent Labs  ?  03/13/22 ?0115 03/14/22 ?0419  ?WBC 11.3* 6.4  ?HGB 11.1* 8.5*  ?HCT 36.2 27.4*  ?PLT 188 168  ? ?BMET ?Recent Labs  ?  03/13/22 ?0115 03/14/22 ?0419  ?NA 137 140  ?K 3.7 3.2*  ?CL 98 106  ?CO2 27 25  ?GLUCOSE 225* 122*  ?BUN 17 9  ?CREATININE 1.09* 0.73  ?CALCIUM 8.6* 7.7*  ? ?LFT ?Recent Labs  ?  03/13/22 ?0115 03/14/22 ?0419  ?PROT 7.7 5.7*  ?ALBUMIN 2.7* 1.9*  ?AST 138* 32  ?ALT 55* 28  ?ALKPHOS 154* 83  ?BILITOT 1.6* 0.3  ? ?PT/INR ?Recent Labs  ?  03/13/22 ?0115  ?LABPROT 19.6*  ?INR 1.7*  ? ?Hepatitis Panel ?No results for input(s): HEPBSAG, HCVAB, HEPAIGM, HEPBIGM in the last 72 hours. ? ? ?Studies/Results ?DG Chest 1 View ? ?Result Date: 03/13/2022 ?CLINICAL DATA:  Vomiting EXAM: CHEST  1 VIEW COMPARISON:  None. FINDINGS: The heart size and mediastinal contours are within normal limits. Both lungs are clear. The visualized skeletal structures are unremarkable. Unchanged position of left chest wall pacemaker. IMPRESSION: No active disease. Electronically Signed   By: Ulyses Jarred M.D.   On: 03/13/2022 02:20  ? ?CT ABDOMEN PELVIS W CONTRAST ? ?Result Date: 03/13/2022 ?CLINICAL DATA:  81 year old female with acute abdominal pain and vomiting. 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 EXAM: CT ABDOMEN AND PELVIS WITH CONTRAST TECHNIQUE: Multidetector CT imaging of the abdomen and pelvis was performed using the standard protocol following bolus administration of  intravenous contrast. RADIATION DOSE REDUCTION: This exam was performed according to the departmental dose-optimization program which includes automated exposure control, adjustment of the mA and/or kV according to patient size and/or use of iterative reconstruction technique. CONTRAST:  149m OMNIPAQUE IOHEXOL 300 MG/ML  SOLN COMPARISON:  CT Abdomen and Pelvis 02/03/2022 and earlier. FINDINGS: Lower chest:  Stable cardiomegaly. No pericardial or pleural effusion. Asymmetric and nodular right breast tissue (series 2, image 13) appears slightly larger since July of last year (now slightly greater than 2 cm dominant nodular component). Lung base scarring or atelectasis is stable. No new lung base opacity. Hepatobiliary: Chronic indwelling cholecystostomy tube appears stable and satisfactory. Multiple hepatic low-density areas redemonstrated and most appears stable from last year. However, the largest in the posterior right lobe appears more indistinct, and has increased from 3.6 cm to nearly 4.3 cm long axis since July (series 2, image 24). Questionable increased indistinct hypodense area at the posterior liver dome also on series 2, image 13, although might be related to contrast timing differences. Pancreas: Negative. Spleen: Negative. Adrenals/Urinary Tract: Negative. Symmetric renal enhancement and contrast excretion. Pelvic phleboliths. Unremarkable urinary bladder. Stomach/Bowel: Redundant large bowel. Similar retained stool in the descending and sigmoid colon. Sigmoid mild to moderate diverticulosis with no active inflammation identified. Normal appendix (series 2, image 55). Decompressed terminal ileum. No dilated small bowel. Decompressed stomach and duodenum. No free air or free fluid. Vascular/Lymphatic: Aortoiliac calcified atherosclerosis. Stable tortuosity of the distal aorta and iliac arteries. Suboptimal intravascular contrast bolus but the major arterial structures appear patent, portal venous system appears to be patent. No lymphadenopathy identified. Reproductive: Stable, with dystrophic calcifications. Other: No pelvic free fluid. Musculoskeletal: Chronic spine degeneration. Mild chronic lumbar spondylolisthesis. No acute or suspicious osseous lesion identified. IMPRESSION: 1. Indeterminate right breast mass (series 2, image 13) appears to be slowly enlarging since July last year. And at the same time,  multiple benign hepatic cysts are suspected - but the dominant right lobe low-density lesion has increased (from 3.6 cm to 4.3 cm) and now is more indistinct. Difficult to exclude a right breast cancer with liver metastasis. Mammogram evaluation would be the next step. 2. Chronic indwelling cholecystostomy tube appears stable and satisfactory. 3. No other acute or inflammatory process identified in the abdomen or pelvis. 4. Stable cardiomegaly.  Aortic Atherosclerosis (ICD10-I70.0). Electronically Signed   By: HGenevie AnnM.D.   On: 03/13/2022 05:14  ? ?CUP PACEART REMOTE DEVICE CHECK ? ?Result Date: 02/18/2022 ?Scheduled remote reviewed. Normal device function.  15 AMS, PAT, longest 350m 16sec Next remote 91 days. LA ? ?IR EXCHANGE BILIARY DRAIN ? ?Addendum Date: 02/25/2022   ?ADDENDUM REPORT: 02/25/2022 16:12 ADDENDUM: These results were called by telephone at the time of interpretation on 02/25/2022 at 4:12 pm to [facility] verbally acknowledged these results. Electronically Signed   By: HeJacqulynn Cadet.D.   On: 02/25/2022 16:12  ? ?Result Date: 02/25/2022 ?INDICATION: [age]ear old female with an indwelling percutaneous cholecystostomy tube which was placed for acute cholecystitis in a poor operative candidate on 07/08/2021. She underwent a capping trial and removal of the cholecystostomy tube but presented again with recurrent acute cholecystitis. A second cholecystostomy tube was placed on 11/04/2021. She presents today from the nursing home with a damaged catheter. The external hub is no longer functioning. EXAM: Cholecystostomy tube exchange MEDICATIONS: None. ANESTHESIA/SEDATION: None. FLUOROSCOPY: Radiation Exposure Index (as provided by the fluoroscopic device): 6 mGy Kerma COMPLICATIONS: None immediate. PROCEDURE: Informed written consent was obtained from  the patient after a thorough discussion of the procedural risks, benefits and alternatives. All questions were addressed. Maximal Sterile  Barrier Technique was utilized including caps, mask, sterile gowns, sterile gloves, sterile drape, hand hygiene and skin antiseptic. A timeout was performed prior to the initiation of the procedure. The existing

## 2022-03-14 NOTE — Progress Notes (Signed)
[facility] Progress Note ? ?   ?Subjective: ?Pain resolved mostly. Doing ok with clears. Lipase down.  ? ?Objective: ?Vital signs in last 24 hours: ?Temp:  [98.5 ?F (36.9 ?C)-99.2 ?F (37.3 ?C)] 99.2 ?F (37.3 ?C) (03/20 1225) ?Pulse Rate:  [57-83] 74 (03/20 1225) ?Resp:  [14-20] 17 (03/20 1225) ?BP: (125-153)/(52-81) 153/81 (03/20 1225) ?SpO2:  [96 %-100 %] 99 % (03/20 1225) ?Last BM Date : 03/12/22 ? ?Intake/Output from previous day: ?03/19 0701 - 03/20 0700 ?In: 2989.9 [P.O.:640; I.V.:1166.2; IV Piggyback:1103.7] ?Out: 125  ?Intake/Output this shift: ?Total I/O ?In: 600 [P.O.:600] ?Out: 800 [Urine:800] ? ?General appearance: alert and no distress ?GI: soft, mildly tender epigastric, IR chole tube in place ? ?Lab Results:  ?Recent Labs  ?  03/13/22 ?0115 03/14/22 ?6045  ?WBC 11.3* 6.4  ?HGB 11.1* 8.5*  ?HCT 36.2 27.4*  ?PLT 188 168  ? ?BMET ?Recent Labs  ?  03/13/22 ?0115 03/14/22 ?0419  ?NA 137 140  ?K 3.7 3.2*  ?CL 98 106  ?CO2 27 25  ?GLUCOSE 225* 122*  ?BUN 17 9  ?CREATININE 1.09* 0.73  ?CALCIUM 8.6* 7.7*  ? ?PT/INR ?Recent Labs  ?  03/13/22 ?0115  ?LABPROT 19.6*  ?INR 1.7*  ? ? ?Studies/Results: ?DG Chest 1 View ? ?Result Date: 03/13/2022 ?CLINICAL DATA:  Vomiting EXAM: CHEST  1 VIEW COMPARISON:  None. FINDINGS: The heart size and mediastinal contours are within normal limits. Both lungs are clear. The visualized skeletal structures are unremarkable. Unchanged position of left chest wall pacemaker. IMPRESSION: No active disease. Electronically Signed   By: Ulyses Jarred M.D.   On: 03/13/2022 02:20  ? ?CT ABDOMEN PELVIS W CONTRAST ? ?Result Date: 03/13/2022 ?CLINICAL DATA:  81 year old female with acute abdominal pain and vomiting. 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 EXAM: CT ABDOMEN AND PELVIS WITH CONTRAST TECHNIQUE: Multidetector CT imaging of the abdomen and pelvis was performed using the standard protocol following bolus  administration of intravenous contrast. RADIATION DOSE REDUCTION: This exam was performed according to the departmental dose-optimization program which includes automated exposure control, adjustment of the mA and/or kV according to patient size and/or use of iterative reconstruction technique. CONTRAST:  160m OMNIPAQUE IOHEXOL 300 MG/ML  SOLN COMPARISON:  CT Abdomen and Pelvis 02/03/2022 and earlier. FINDINGS: Lower chest: Stable cardiomegaly. No pericardial or pleural effusion. Asymmetric and nodular right breast tissue (series 2, image 13) appears slightly larger since July of last year (now slightly greater than 2 cm dominant nodular component). Lung base scarring or atelectasis is stable. No new lung base opacity. Hepatobiliary: Chronic indwelling cholecystostomy tube appears stable and satisfactory. Multiple hepatic low-density areas redemonstrated and most appears stable from last year. However, the largest in the posterior right lobe appears more indistinct, and has increased from 3.6 cm to nearly 4.3 cm long axis since July (series 2, image 24). Questionable increased indistinct hypodense area at the posterior liver dome also on series 2, image 13, although might be related to contrast timing differences. Pancreas: Negative. Spleen: Negative. Adrenals/Urinary Tract: Negative. Symmetric renal enhancement and contrast excretion. Pelvic phleboliths. Unremarkable urinary bladder. Stomach/Bowel: Redundant large bowel. Similar retained stool in the descending and sigmoid colon. Sigmoid mild to moderate diverticulosis with no active inflammation identified. Normal appendix (series 2, image 55). Decompressed terminal ileum. No dilated small bowel. Decompressed stomach and duodenum. No free air or free fluid. Vascular/Lymphatic: Aortoiliac calcified atherosclerosis. Stable tortuosity of the distal aorta and iliac arteries. Suboptimal intravascular contrast bolus  but the major arterial structures appear patent,  portal venous system appears to be patent. No lymphadenopathy identified. Reproductive: Stable, with dystrophic calcifications. Other: No pelvic free fluid. Musculoskeletal: Chronic spine degeneration. Mild chronic lumbar spondylolisthesis. No acute or suspicious osseous lesion identified. IMPRESSION: 1. Indeterminate right breast mass (series 2, image 13) appears to be slowly enlarging since July last year. And at the same time, multiple benign hepatic cysts are suspected - but the dominant right lobe low-density lesion has increased (from 3.6 cm to 4.3 cm) and now is more indistinct. Difficult to exclude a right breast cancer with liver metastasis. Mammogram evaluation would be the next step. 2. Chronic indwelling cholecystostomy tube appears stable and satisfactory. 3. No other acute or inflammatory process identified in the abdomen or pelvis. 4. Stable cardiomegaly.  Aortic Atherosclerosis (ICD10-I70.0). Electronically Signed   By: Genevie Ann M.D.   On: 03/13/2022 05:14   ? ?Anti-infectives: ?Anti-infectives (From admission, onward)  ? ? Start     Dose/Rate Route Frequency Ordered Stop  ? 03/14/22 0600  cefTRIAXone (ROCEPHIN) 2 g in sodium chloride 0.9 % 100 mL IVPB       ?See Hyperspace for full Linked Orders Report.  ? 2 g ?200 mL/hr over 30 Minutes Intravenous Every 24 hours 03/13/22 1104    ? 03/13/22 2000  metroNIDAZOLE (FLAGYL) IVPB 500 mg       ?See Hyperspace for full Linked Orders Report.  ? 500 mg ?100 mL/hr over 60 Minutes Intravenous Every 12 hours 03/13/22 1104    ? 03/13/22 0245  cefTRIAXone (ROCEPHIN) 2 g in sodium chloride 0.9 % 100 mL IVPB       ?See Hyperspace for full Linked Orders Report.  ? 2 g ?200 mL/hr over 30 Minutes Intravenous  Once 03/13/22 0240 03/13/22 0431  ? 03/13/22 0245  metroNIDAZOLE (FLAGYL) IVPB 500 mg       ?See Hyperspace for full Linked Orders Report.  ? 500 mg ?100 mL/hr over 60 Minutes Intravenous  Once 03/13/22 0240 03/13/22 0651  ? ?  ? ? ?Assessment/Plan: ?Patient with  cholecystostomy tube in place since 06/2021 for cholecystitis, now with gallstone pancreatitis from debride/ sludge that has formed since the IR tube has been in place.  ?ERCP tomorrow ?Possibly Lap chole Wednesday if no unseen anesthesia events  ? ?Updated family and patient. ?PLAN: I counseled the patient about the indication, risks and benefits of laparoscopic cholecystectomy.  She understands there is a very small chance for bleeding, infection, injury to normal structures (including common bile duct), conversion to open surgery, persistent symptoms, evolution of postcholecystectomy diarrhea, need for secondary interventions, anesthesia reaction, cardiopulmonary issues and other risks not specifically detailed here. I described the expected recovery, the plan for follow-up and the restrictions during the recovery phase.  All questions were answered. ? ? ? LOS: 1 day  ? ? ?Virl Cagey ?03/14/2022 ? ?

## 2022-03-14 NOTE — Assessment & Plan Note (Addendum)
IV replacement ordered.  ?Magnesium repleted.  ?Recheck bmp in AM ?

## 2022-03-14 NOTE — Progress Notes (Signed)
?  Transition of Care (TOC) Screening Note ? ? ?Patient Details  ?Name: Natalie Castro ?Date of Birth: [birth year] ? ? ?Transition of Care (TOC) CM/SW Contact:    ?Boneta Lucks, RN ?Phone Number: ?03/14/2022, 12:06 PM ? ? ? ?Transition of Care Department [facility]) has reviewed patient and no TOC needs have been identified at this time. We will continue to monitor patient advancement through interdisciplinary progression rounds. If new patient transition needs arise, please place a TOC consult. ? ? ?

## 2022-03-14 NOTE — Progress Notes (Signed)
Initial Nutrition Assessment ? ?DOCUMENTATION CODES:  ? ?Obesity unspecified ? ?INTERVENTION:  ?- Encourage PO intake ?- Boost Breeze po TID, each supplement provides 250 kcal and 9 grams of protein (peach flavor) ?- Clear liquid diet preferences placed in Health Touch ?- MVI with minerals daily ? ?NUTRITION DIAGNOSIS:  ? ?Inadequate oral intake related to acute illness as evidenced by meal completion < 50%. ? ?GOAL:  ? ?Patient will meet greater than or equal to 90% of their needs ? ?MONITOR:  ? ?PO intake, Supplement acceptance, Diet advancement, Labs, Weight trends, I & O's ? ?REASON FOR ASSESSMENT:  ? ?Malnutrition Screening Tool ?  ? ?ASSESSMENT:  ? ?Pt admitted from home with emesis and abdominal pain. PMH significant for CAD, T2DM, complete heart block s/p PPM, HTN, dyslipidemia, pulmonary embolus on apixaban, CHF, and chronic cholecystitis with percutaneous drain. ? ?Discussed in rounds. Per Surgery, pt now with gallstone pancreatitis from debride/sludge that formed since IT tube placement 06/2021. Plans for ERCP tomorrow and possible cholecystectomy Wednesday.  ? ?Pt reports PTA she was eating 2-3 meals per day however she has been eating less than usual lately given she has not been feeling well. At home her typical PO intake includes Progresso soups, fruit cocktail, jello with fruit, bean and bacon soup and has recently been wanting [facility] instead of sweet tea. During admission she reports liking the chicken broth, sprite, sweet tea, and mango fruit ice. Dislikes the beef broth and italian fruit ice. Preferences added to Health Touch. She did not like wild berry [employer]. Provided peach Boost Breeze during visit which she was able to tolerate. Once diet advances, she is agreeable to receive a chocolate Ensure supplement to enhance nutritional adequacy. ? ?Meal completions: 25% x 2 recorded meals on clear liquid diet ? ?Pt reports a usual weight of ~200 lbs a couple months ago. D/t decrease in  appetite, she endorses weight loss with a current weight of 186 lbs. Per review of chart, pt's weight has been declining within the last year. Noted to have a 13% weight loss within the last month which is significant for time frame. ? ?Suspect a degree of underlying malnutrition may be present given weight loss and pt report of decrease in appetite. However, unable to diagnose at this time. Will continue to monitor throughout admission. ? ?Medications: SSI, protonix, IV abx ? ?Labs: potassium 3.2, lipase 58, HgbA1c 6.6%, CBG's 101-150 x24 hours ? ?RUQ biliary tube to gravity ? ?NUTRITION - FOCUSED PHYSICAL EXAM: ? ?Flowsheet Row Most Recent Value  ?Orbital Region No depletion  ?Upper Arm Region No depletion  ?Thoracic and Lumbar Region No depletion  ?Buccal Region Mild depletion  ?Temple Region Moderate depletion  ?Clavicle Bone Region Mild depletion  ?Clavicle and Acromion Bone Region No depletion  ?Scapular Bone Region No depletion  ?Dorsal Hand Mild depletion  ?Patellar Region Mild depletion  ?Anterior Thigh Region Mild depletion  ?Posterior Calf Region Mild depletion  ?Edema (RD Assessment) None  ?Hair Reviewed  ?Eyes Reviewed  ?Mouth Reviewed  ?Skin Reviewed  ?Nails Reviewed  ? ?  ? ? ?Diet Order:   ?Diet Order   ? ?       ?  Diet NPO time specified  Diet effective midnight       ?  ?  Diet clear liquid Room service appropriate? Yes; Fluid consistency: Thin  Diet effective now       ?  ? ?  ?  ? ?  ? ? ?EDUCATION  NEEDS:  ? ?Education needs have been addressed ? ?Skin:  Skin Assessment: Reviewed RN Assessment ? ?Last BM:  3/18 ? ?Height:  ? ?Ht Readings from Last 1 Encounters:  ?03/13/22 '5\' 4"'$  (1.626 m)  ? ? ?Weight:  ? ?Wt Readings from Last 1 Encounters:  ?03/13/22 80.1 kg  ? ? ?Ideal Body Weight:  54.5 kg ? ?BMI:  Body mass index is 30.3 kg/m?. ? ?Estimated Nutritional Needs:  ? ?Kcal:  1600-1800 ? ?Protein:  80-95g ? ?Fluid:  >/=1.6L ? ?Clayborne Dana, RDN, LDN ?Clinical Nutrition ?

## 2022-03-14 NOTE — Assessment & Plan Note (Addendum)
ERCP on 3/21 with Dr. Laural Golden.  ?

## 2022-03-14 NOTE — Assessment & Plan Note (Signed)
Surgery and GI is involved.  ?In discussion for possible ERCP on 3/21.  ?

## 2022-03-14 NOTE — Assessment & Plan Note (Signed)
LFTs are normalized. Lipase is down to 51.  ?

## 2022-03-14 NOTE — Progress Notes (Signed)
ANTICOAGULATION CONSULT NOTE -  ? ?Pharmacy Consult for heparin ?Indication: hx of pulmonary embolus ? ?Allergies  ?Allergen Reactions  ? Penicillins Hives, Itching and Other (See Comments)  ?  Has tolerated ceftriaxone 7/22 ?Has patient had a PCN reaction causing immediate rash, facial/tongue/throat swelling, SOB or lightheadedness with hypotension: No ?Has patient had a PCN reaction causing severe rash involving mucus membranes or skin necrosis: Yes ?Has patient had a PCN reaction that required hospitalization No ?Has patient had a PCN reaction occurring within the last 10 years: No ?If all of the above answers are "NO", then may proceed with Cephalosporin use. ?  ? ? ?Patient Measurements: ?Height: '5\' 4"'$  (162.6 cm) ?Weight: 80.1 kg (176 lb 8 oz) ?IBW/kg (Calculated) : 54.7 ?Heparin Dosing Weight: 74 kg ? ?Vital Signs: ?Temp: 99.2 ?F (37.3 ?C) (03/20 6712) ?Temp Source: Oral (03/20 4580) ?BP: 149/65 (03/20 9983) ?Pulse Rate: 77 (03/20 0625) ? ?Labs: ?Recent Labs  ?  03/13/22 ?0115 03/13/22 ?2024 03/14/22 ?0419  ?HGB 11.1*  --  8.5*  ?HCT 36.2  --  27.4*  ?PLT 188  --  168  ?APTT  --  68* 100*  ?LABPROT 19.6*  --   --   ?INR 1.7*  --   --   ?HEPARINUNFRC  --   --  >1.10*  ?CREATININE 1.09*  --  0.73  ? ? ? ?Estimated Creatinine Clearance: 57.5 mL/min (by C-G formula based on SCr of 0.73 mg/dL). ? ? ?Medical History: ?Past Medical History:  ?Diagnosis Date  ? Acid reflux   ? Arthritis   ? Colon adenomas   ? AGE 81  ? Complete heart block ([place])   ? STJ PPM Dr. [name] 11/24/15  ? Essential hypertension   ? History of pulmonary embolism 2017  ? unprovoked, long term anticoag with apixaban   ? Hyperlipidemia   ? Myoview 03/2021   ? Myoview 4/22: EF 50, no infarct or ischemia; low risk  ? Presence of permanent cardiac pacemaker   ? Type 2 diabetes mellitus ([place])   ? ? ?Medications:  ?Medications Prior to Admission  ?Medication Sig Dispense Refill Last Dose  ? apixaban (ELIQUIS) 5 MG TABS tablet Take 1 tablet (5 mg total)  by mouth 2 (two) times daily. 60 tablet 0 03/13/2022 at 1000  ? atorvastatin (LIPITOR) 40 MG tablet Take 1 tablet (40 mg total) by mouth daily. 30 tablet 0 Past Week  ? furosemide (LASIX) 40 MG tablet Take 1 tablet (40 mg total) by mouth daily. 30 tablet 0 03/13/2022  ? LANTUS SOLOSTAR 100 UNIT/ML Solostar Pen Inject 10 Units into the skin at bedtime. 15 mL 0 Past Week  ? metFORMIN (GLUCOPHAGE) 1000 MG tablet TAKE (1) TABLET BY MOUTH TWICE DAILY. 60 tablet 0 03/13/2022  ? metoprolol succinate (TOPROL-XL) 50 MG 24 hr tablet Take 1 tablet (50 mg total) by mouth daily. Take with or immediately following a meal. 60 tablet 0 03/13/2022 at 0900  ? nitroGLYCERIN (NITROSTAT) 0.4 MG SL tablet Place 1 tablet (0.4 mg total) under the tongue every 5 (five) minutes as needed for chest pain. 25 tablet 0 unk  ? cyanocobalamin (,VITAMIN B-12,) 1000 MCG/ML injection Inject 1 mL (1,000 mcg total) into the muscle once a week for 28 days. (Patient not taking: Reported on 03/14/2022) 4 mL 0 Not Taking  ? glucose blood test strip Use to check blood glucose fasting , before lunch and dinner and after your largest meal 120 each 0   ? Insulin Pen Needle (  BD AUTOSHIELD DUO) 30G X 5 MM MISC 30 gauge x 3/16" ?(pen needle,diabetic dual safty)     ? Lancets (ONETOUCH ULTRASOFT) lancets Use to check glucose 4 x daily 120 each 0   ? polyethylene glycol (MIRALAX / GLYCOLAX) 17 g packet Take 17 g by mouth daily as needed for mild constipation. (Patient not taking: Reported on 03/14/2022) 14 each 0 Not Taking  ? ? ?Assessment: ?Pharmacy consulted to dose heparin for patient with history of pulmonary embolism 2017.  Patient is on apixaban prior to admission with last dose 3/18 unknown time.  Will need to monitor based on aPTT until correlation with heparin levels. ? ?HL >1.10- still elevated from apixaban ?aPTT 100- upper end of therapeutic ?Hgb 11.1 > 8.5- monitor ? ? ?Goal of Therapy:  ?Heparin level 0.3-0.7 units/ml ?aPTT 66-102 seconds ?Monitor  platelets by anticoagulation protocol: Yes ?  ?Plan:  ?Decrease Heparin infusion to 1100 units/hr ?Check anti-Xa level/aPTT in 8 hours and daily. ?Continue to monitor H&H and platelets. ? ? ?Margot Ables, PharmD ?Clinical Pharmacist ?03/14/2022 9:33 AM ? ? ?

## 2022-03-14 NOTE — Assessment & Plan Note (Addendum)
10/30/2021 hemoglobin A1c 6.8 ?02/05/22 A1C--6.6 ?NovoLog sliding scale ?Holding metformin>>restart after d/c ? ?

## 2022-03-14 NOTE — Assessment & Plan Note (Addendum)
She is on apixaban PTA ?IV heparin bridge while apixaban on hold ?Restart apixaban when ok with surgery ?

## 2022-03-14 NOTE — Assessment & Plan Note (Addendum)
Sepsis physiology has resolved with supportive measures.  ?Presented with fever, leukocytosis and bacteremia ?

## 2022-03-14 NOTE — Hospital Course (Addendum)
81 y.o. female with history HFpEF, coronary disease, diabetes mellitus type 2, complete heart block status post permanent pacemaker, hypertension, hyperlipidemia, pulmonary embolus on apixaban, and and chronic cholecystitis with percutaneous drain who presented to the ED with worsening generalized abdominal pain as well as nausea and vomiting that appears to have began in the last 1 day.  She was noted to have a fever as well.  Upon chart review, it appears that she was supposed to have her percutaneous cholecystostomy drain replaced on 3/3 due to the fact that it was dysfunctional, it does not appear to have been done.  Patient is quite lethargic at this time and cannot give any further history. ?  ?In the ED, CT scan of the abdomen and pelvis was performed with no acute process mentioned.  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  Cholecystostomy drain appears to be in good position.  She is noted to have significant LFT elevation and lipase is over 9000.  She has been started on 2 L fluid bolus and has been given Rocephin and Flagyl. ? ?03/14/2022:  Pt feeling much better.  Lipase way down to 51. Tolerating clear liquid diet.  Cardiology preop eval reporting intermediate risk for surgery.  Awaiting anesthesia team to give opinion.  ? ?03/15/2022:  ERCP and sphinterotomy performed with Dr. Laural Golden.  Anticipating cholecystectomy with Dr. Constance Haw on 03/16/22.   ? ?03/16/22: lap chole done without any immediate post-op complications ? ?03/17/22: changed ceftriaxone to cefazolin.  Pain in abd controlled.  Advanced diet to soft diet.  Plan for d/c home 3/24 if tolerates diet and hospital bed delivered. ? ?03/18/22:  remains stable for d/c.  D/c home with cipro x 5 days to complete 10 day course of abx for Ecoli bacteremia ?

## 2022-03-14 NOTE — Assessment & Plan Note (Addendum)
IV replacement ordered 3/20 and repleted ?  ?

## 2022-03-14 NOTE — Assessment & Plan Note (Signed)
Pt was evaluated by cardiology and noted that pacemaker has been functioning normally.  ?

## 2022-03-15 ENCOUNTER — Other Ambulatory Visit: Payer: Self-pay

## 2022-03-15 ENCOUNTER — Inpatient Hospital Stay (HOSPITAL_COMMUNITY): Payer: Medicare HMO | Admitting: Certified Registered Nurse Anesthetist

## 2022-03-15 ENCOUNTER — Inpatient Hospital Stay (HOSPITAL_COMMUNITY): Payer: Medicare HMO

## 2022-03-15 ENCOUNTER — Encounter (HOSPITAL_COMMUNITY)
Admission: EM | Disposition: A | Discharge status: Home Health | Payer: Self-pay | Source: Home / Self Care | Attending: Internal Medicine

## 2022-03-15 ENCOUNTER — Encounter (HOSPITAL_COMMUNITY): Payer: Self-pay | Admitting: Family Medicine

## 2022-03-15 DIAGNOSIS — K838 Other specified diseases of biliary tract: Secondary | ICD-10-CM

## 2022-03-15 DIAGNOSIS — E860 Dehydration: Secondary | ICD-10-CM | POA: Diagnosis not present

## 2022-03-15 DIAGNOSIS — K8050 Calculus of bile duct without cholangitis or cholecystitis without obstruction: Secondary | ICD-10-CM

## 2022-03-15 DIAGNOSIS — Z86711 Personal history of pulmonary embolism: Secondary | ICD-10-CM | POA: Diagnosis not present

## 2022-03-15 DIAGNOSIS — I509 Heart failure, unspecified: Secondary | ICD-10-CM

## 2022-03-15 DIAGNOSIS — I110 Hypertensive heart disease with heart failure: Secondary | ICD-10-CM

## 2022-03-15 DIAGNOSIS — K812 Acute cholecystitis with chronic cholecystitis: Secondary | ICD-10-CM | POA: Diagnosis not present

## 2022-03-15 DIAGNOSIS — K859 Acute pancreatitis without necrosis or infection, unspecified: Secondary | ICD-10-CM

## 2022-03-15 DIAGNOSIS — R932 Abnormal findings on diagnostic imaging of liver and biliary tract: Secondary | ICD-10-CM

## 2022-03-15 HISTORY — PX: ENDOSCOPIC RETROGRADE CHOLANGIOPANCREATOGRAPHY (ERCP) WITH PROPOFOL: SHX5810

## 2022-03-15 HISTORY — PX: REMOVAL OF STONES: SHX5545

## 2022-03-15 LAB — COMPREHENSIVE METABOLIC PANEL
ALT: 19 U/L (ref 0–44)
AST: 16 U/L (ref 15–41)
Albumin: 1.9 g/dL — ABNORMAL LOW (ref 3.5–5.0)
Alkaline Phosphatase: 71 U/L (ref 38–126)
Anion gap: 6 (ref 5–15)
BUN: <5 mg/dL — ABNORMAL LOW (ref 8–23)
CO2: 25 mmol/L (ref 22–32)
Calcium: 7.7 mg/dL — ABNORMAL LOW (ref 8.9–10.3)
Chloride: 109 mmol/L (ref 98–111)
Creatinine, Ser: 0.71 mg/dL (ref 0.44–1.00)
GFR, Estimated: >60 mL/min (ref >60)
Glucose, Bld: 169 mg/dL — ABNORMAL HIGH (ref 70–99)
Potassium: 3.4 mmol/L — ABNORMAL LOW (ref 3.5–5.1)
Sodium: 140 mmol/L (ref 135–145)
Total Bilirubin: 0.2 mg/dL — ABNORMAL LOW (ref 0.3–1.2)
Total Protein: 5.9 g/dL — ABNORMAL LOW (ref 6.5–8.1)

## 2022-03-15 LAB — CULTURE, BLOOD (ROUTINE X 2)

## 2022-03-15 LAB — GLUCOSE, CAPILLARY
Glucose-Capillary: 121 mg/dL — ABNORMAL HIGH (ref 70–99)
Glucose-Capillary: 142 mg/dL — ABNORMAL HIGH (ref 70–99)
Glucose-Capillary: 149 mg/dL — ABNORMAL HIGH (ref 70–99)
Glucose-Capillary: 155 mg/dL — ABNORMAL HIGH (ref 70–99)
Glucose-Capillary: 168 mg/dL — ABNORMAL HIGH (ref 70–99)
Glucose-Capillary: 216 mg/dL — ABNORMAL HIGH (ref 70–99)

## 2022-03-15 LAB — CBC
HCT: 27.7 % — ABNORMAL LOW (ref 36.0–46.0)
Hemoglobin: 9.3 g/dL — ABNORMAL LOW (ref 12.0–15.0)
MCH: 31.8 pg (ref 26.0–34.0)
MCHC: 33.6 g/dL (ref 30.0–36.0)
MCV: 94.9 fL (ref 80.0–100.0)
Platelets: 211 10*3/uL (ref 150–400)
RBC: 2.92 MIL/uL — ABNORMAL LOW (ref 3.87–5.11)
RDW: 13.8 % (ref 11.5–15.5)
WBC: 6.9 10*3/uL (ref 4.0–10.5)
nRBC: 0 % (ref 0.0–0.2)

## 2022-03-15 LAB — MAGNESIUM: Magnesium: 2 mg/dL (ref 1.7–2.4)

## 2022-03-15 LAB — HEPARIN LEVEL (UNFRACTIONATED): Heparin Unfractionated: 0.55 IU/mL (ref 0.30–0.70)

## 2022-03-15 LAB — APTT: aPTT: 86 seconds — ABNORMAL HIGH (ref 24–36)

## 2022-03-15 LAB — LIPASE, BLOOD: Lipase: 32 U/L (ref 11–51)

## 2022-03-15 SURGERY — ENDOSCOPIC RETROGRADE CHOLANGIOPANCREATOGRAPHY (ERCP) WITH PROPOFOL
Anesthesia: General

## 2022-03-15 MED ORDER — STERILE WATER FOR IRRIGATION IR SOLN
Status: DC | PRN
  Administered 2022-03-15: 1000 mL

## 2022-03-15 MED ORDER — IOHEXOL 300 MG/ML  SOLN
INTRAMUSCULAR | Status: DC | PRN
  Administered 2022-03-15: 30 mL

## 2022-03-15 MED ORDER — LIDOCAINE HCL (CARDIAC) PF 100 MG/5ML IV SOSY
PREFILLED_SYRINGE | INTRAVENOUS | Status: DC | PRN
Start: 2022-03-15 — End: 2022-03-15
  Administered 2022-03-15: 60 mg via INTRATRACHEAL

## 2022-03-15 MED ORDER — HYDROMORPHONE HCL 1 MG/ML IJ SOLN: 0.2500 mg | INTRAMUSCULAR | Status: DC | PRN

## 2022-03-15 MED ORDER — SUCCINYLCHOLINE CHLORIDE 200 MG/10ML IV SOSY
PREFILLED_SYRINGE | INTRAVENOUS | Status: DC | PRN
  Administered 2022-03-15: 120 mg via INTRAVENOUS

## 2022-03-15 MED ORDER — SUGAMMADEX SODIUM 500 MG/5ML IV SOLN
INTRAVENOUS | Status: DC | PRN
Start: 2022-03-15 — End: 2022-03-15
  Administered 2022-03-15: 200 mg via INTRAVENOUS

## 2022-03-15 MED ORDER — ROCURONIUM BROMIDE 10 MG/ML (PF) SYRINGE
PREFILLED_SYRINGE | INTRAVENOUS | Status: DC | PRN
  Administered 2022-03-15: 30 mg via INTRAVENOUS

## 2022-03-15 MED ORDER — GLUCAGON HCL RDNA (DIAGNOSTIC) 1 MG IJ SOLR
INTRAMUSCULAR | Status: AC
  Filled 2022-03-15: qty 1

## 2022-03-15 MED ORDER — GLUCAGON HCL RDNA (DIAGNOSTIC) 1 MG IJ SOLR
INTRAMUSCULAR | Status: DC | PRN
Start: 2022-03-15 — End: 2022-03-15
  Administered 2022-03-15: .25 mg via INTRAVENOUS

## 2022-03-15 MED ORDER — ONDANSETRON HCL 4 MG/2ML IJ SOLN
INTRAMUSCULAR | Status: DC | PRN
  Administered 2022-03-15: 4 mg via INTRAVENOUS

## 2022-03-15 MED ORDER — PROPOFOL 10 MG/ML IV BOLUS
INTRAVENOUS | Status: DC | PRN
  Administered 2022-03-15: 110 mg via INTRAVENOUS

## 2022-03-15 MED ORDER — PROPOFOL 10 MG/ML IV BOLUS
INTRAVENOUS | Status: AC
  Filled 2022-03-15: qty 20

## 2022-03-15 MED ORDER — POTASSIUM CHLORIDE 10 MEQ/100ML IV SOLN
10.0000 meq | INTRAVENOUS | Status: AC
  Administered 2022-03-15 (×2): 10 meq via INTRAVENOUS
  Filled 2022-03-15 (×2): qty 100

## 2022-03-15 MED ORDER — SODIUM CHLORIDE 0.9 % IV SOLN
INTRAVENOUS | Status: AC
  Filled 2022-03-15: qty 50

## 2022-03-15 NOTE — Op Note (Signed)
[facility] ?Patient Name: Natalie Castro ?Procedure Date: 03/15/2022 1:17 PM ?MRN: [id number] ?Date of Birth: Jan 09, 1941 ?Attending MD: Hildred Laser , MD ?CSN: [id number] ?Age: 81 ?Admit Type: Inpatient ?Procedure:                ERCP ?Indications:              For therapy of bile duct stone(s), Acute  ?                          pancreatitis ?Providers:                Hildred Laser, MD, Charlsie Quest Theda Sers RN, RN, Manuela Schwartz  ?                          Cheryln Manly, Technician ?Referring MD:             Irwin Brakeman, MD ?Medicines:                General Anesthesia ?Complications:            No immediate complications. ?Estimated Blood Loss:     Estimated blood loss was minimal. ?Procedure:                Pre-Anesthesia Assessment: ?                          - Prior to the procedure, a History and Physical  ?                          was performed, and patient medications and  ?                          allergies were reviewed. The patient's tolerance of  ?                          previous anesthesia was also reviewed. The risks  ?                          and benefits of the procedure and the sedation  ?                          options and risks were discussed with the patient.  ?                          All questions were answered, and informed consent  ?                          was obtained. Prior Anticoagulants: The patient  ?                          last took heparin on the day of the procedure. ASA  ?                          Grade Assessment: III - A patient with severe  ?  systemic disease. After reviewing the risks and  ?                          benefits, the patient was deemed in satisfactory  ?                          condition to undergo the procedure. ?                          After obtaining informed consent, the scope was  ?                          passed under direct vision. Throughout the  ?                          procedure, the patient's blood pressure,  pulse, and  ?                          oxygen saturations were monitored continuously. The  ?                          [employer] D single use  ?                          duodenoscope was introduced through the mouth, and  ?                          used to inject contrast into and used to inject  ?                          contrast into the bile duct. The ERCP was  ?                          accomplished without difficulty. The patient  ?                          tolerated the procedure well. ?Scope In: 1:57:10 PM ?Scope Out: 2:16:01 PM ?Total Procedure Duration: 0 hours 18 minutes 51 seconds  ?Findings: ?     A biliary stent was visible on the scout film. The major papilla was  ?     bulging. The bile duct was deeply cannulated with the Autotome  ?     sphincterotome. Contrast was injected. I personally interpreted the bile  ?     duct images. There was brisk flow of contrast through the ducts. Image  ?     quality was excellent. Contrast extended to the main bile duct. Contrast  ?     extended to the cystic duct. Contrast extended to the gallbladder.  ?     Contrast extended to the bifurcation. The common bile duct and common  ?     hepatic duct were moderately dilated and diffusely dilated. The common  ?     bile duct contained filling defect(s) thought to be a stone and sludge.  ?     A 12 mm biliary sphincterotomy was made with a braided Autotome  ?  sphincterotome using ERBE electrocautery. The sphincterotomy oozed  ?     blood. The biliary tree was swept with a 12 mm balloon and basket  ?     starting at the bifurcation. Large piece of stone and sludge removed  ?     with Damia basket and stone balloon extractor. No stones remained. ?Impression:               - The major papilla appeared to be bulging. ?                          - Moderately dilated CBD and CHD containing large  ?                          filling defect. ?                          - Biliary sphincterotomy performed and  large piece  ?                          of stone and sludge removed with Damia basket and  ?                          balloon. ?                          - Patent cystic duct and gallbladder outlined. ?Moderate Sedation: ?     Per Anesthesia Care ?Recommendation:           - Return patient to hospital ward for ongoing care. ?                          - Avoid aspirin and nonsteroidal anti-inflammatory  ?                          medicines for 3 days. ?                          - Continue present medications. ?                          - Repeat lab in a.m. ?                          - Cholecystectomy possibly in a.m. per Dr. Constance Haw ?Procedure Code(s):        --- Professional --- ?                          [phone], Endoscopic retrograde  ?                          cholangiopancreatography (ERCP); with removal of  ?                          calculi/debris from biliary/pancreatic duct(s) ?                          [id number], Endoscopic retrograde  ?  cholangiopancreatography (ERCP); with  ?                          sphincterotomy/papillotomy ?Diagnosis Code(s):        --- Professional --- ?                          K83.8, Other specified diseases of biliary tract ?                          K80.50, Calculus of bile duct without cholangitis  ?                          or cholecystitis without obstruction ?                          K85.90, Acute pancreatitis without necrosis or  ?                          infection, unspecified ?                          R93.2, Abnormal findings on diagnostic imaging of  ?                          liver and biliary tract ?CPT copyright 2019 American Medical Association. All rights reserved. ?The codes documented in this report are preliminary and upon coder review may  ?be revised to meet current compliance requirements. ?Hildred Laser, MD ?Hildred Laser, MD ?03/15/2022 5:35:24 PM ?This report has been signed electronically. ?Number of Addenda: 0 ?

## 2022-03-15 NOTE — Progress Notes (Signed)
[facility] Progress Note ? ?Day of Surgery  ?Subjective: ?Saw patient before her ERCP. Doing fair and having no pain. Discussed lap cholecystectomy with her and family yesterday, no questions.  ? ?Objective: ?Vital signs in last 24 hours: ?Temp:  [98.9 ?F (37.2 ?C)-99.6 ?F (37.6 ?C)] 99.6 ?F (37.6 ?C) (03/21 1211) ?Pulse Rate:  [78-79] 79 (03/21 1211) ?Resp:  [18-23] 23 (03/21 1211) ?BP: (143-152)/(55-64) 152/64 (03/21 1211) ?SpO2:  [97 %-98 %] 98 % (03/21 1211) ?Last BM Date : 03/12/22 ? ?Intake/Output from previous day: ?03/20 0701 - 03/21 0700 ?In: 3846.9 [P.O.:840; I.V.:2393.3; IV Piggyback:613.6] ?Out: 2425 [Urine:2200; Drains:225] ?Intake/Output this shift: ?Total I/O ?In: 0  ?Out: 200 [Other:200] ? ?General appearance: alert and no distress ?GI: soft, nondistended, nontender , chole tube in place  ? ?Lab Results:  ?Recent Labs  ?  03/14/22 ?0419 03/15/22 ?8891  ?WBC 6.4 6.9  ?HGB 8.5* 9.3*  ?HCT 27.4* 27.7*  ?PLT 168 211  ? ?BMET ?Recent Labs  ?  03/14/22 ?0419 03/15/22 ?0628  ?NA 140 140  ?K 3.2* 3.4*  ?CL 106 109  ?CO2 25 25  ?GLUCOSE 122* 169*  ?BUN 9 <5*  ?CREATININE 0.73 0.71  ?CALCIUM 7.7* 7.7*  ? ?PT/INR ?Recent Labs  ?  03/13/22 ?0115  ?LABPROT 19.6*  ?INR 1.7*  ? ? ?Studies/Results: ?No results found. ? ?Anti-infectives: ?Anti-infectives (From admission, onward)  ? ? Start     Dose/Rate Route Frequency Ordered Stop  ? 03/14/22 0600  [MAR Hold]  cefTRIAXone (ROCEPHIN) 2 g in sodium chloride 0.9 % 100 mL IVPB        (MAR Hold since Tue 03/15/2022 at 1203.Hold Reason: Transfer to a Procedural area)  ?See Hyperspace for full Linked Orders Report.  ? 2 g ?200 mL/hr over 30 Minutes Intravenous Every 24 hours 03/13/22 1104    ? 03/13/22 2000  [MAR Hold]  metroNIDAZOLE (FLAGYL) IVPB 500 mg        (MAR Hold since Tue 03/15/2022 at 1203.Hold Reason: Transfer to a Procedural area)  ?See Hyperspace for full Linked Orders Report.  ? 500 mg ?100 mL/hr over 60 Minutes Intravenous Every 12 hours  03/13/22 1104    ? 03/13/22 0245  cefTRIAXone (ROCEPHIN) 2 g in sodium chloride 0.9 % 100 mL IVPB       ?See Hyperspace for full Linked Orders Report.  ? 2 g ?200 mL/hr over 30 Minutes Intravenous  Once 03/13/22 0240 03/13/22 0431  ? 03/13/22 0245  metroNIDAZOLE (FLAGYL) IVPB 500 mg       ?See Hyperspace for full Linked Orders Report.  ? 500 mg ?100 mL/hr over 60 Minutes Intravenous  Once 03/13/22 0240 03/13/22 0651  ? ?  ? ? ?Assessment/Plan: ?Patient with cholecystostomy tube in place since 06/2021 and choledocholithiasis and gallstone pancreatitis. Getting ERCP today. If goes well potential Lap chole 3/22.  ?NPO midnight ?Heparin gtt held 6 AM 3/22 for 12 noon or after surgery  ? ? LOS: 2 days  ? ? ?Virl Cagey ?03/15/2022 ? ?

## 2022-03-15 NOTE — Anesthesia Postprocedure Evaluation (Signed)
Anesthesia Post Note ? ?Patient: Natalie Castro ? ?Procedure(s) Performed: ENDOSCOPIC RETROGRADE CHOLANGIOPANCREATOGRAPHY (ERCP) WITH PROPOFOL, BILIARY SPHINCTEROTMY AND STONE/SLUDGE EXTRACTION (Abdomen) ? ?Patient location during evaluation: PACU ?Anesthesia Type: General ?Level of consciousness: awake and alert and oriented ?Pain management: pain level controlled ?Vital Signs Assessment: post-procedure vital signs reviewed and stable ?Respiratory status: spontaneous breathing, nonlabored ventilation and respiratory function stable ?Cardiovascular status: blood pressure returned to baseline and stable ?Postop Assessment: no apparent nausea or vomiting ?Anesthetic complications: no ? ? ?No notable events documented. ? ? ?Last Vitals:  ?Vitals:  ? 03/15/22 1430 03/15/22 1445  ?BP: (!) 130/114 (!) 145/69  ?Pulse: 75 73  ?Resp: (!) 6 (!) 25  ?Temp:  37.3 ?C  ?SpO2: 97% 98%  ?  ?Last Pain:  ?Vitals:  ? 03/15/22 1445  ?TempSrc:   ?PainSc: 0-No pain  ? ? ?  ?  ?  ?  ?  ?  ? ?Shaily Librizzi C Emsley Custer ? ? ? ? ?

## 2022-03-15 NOTE — Assessment & Plan Note (Addendum)
3/19 CT abd--multiple hepatic cysts with slight increase in size;  Chronic indwelling cholecystostomy tube appears stable and satisfactory ?Lipase 9034 upon presentation ?3/21 ERCP--Dilated CBD and CHD with large long tubular filling defect filling duct consistent with stone and sludge. ?Biliary sphincterotomy performed and stone/sludge removed with Damia basket and stone balloon extractor. ?3/22--lap chole ?03/17/22--advance diet to soft diet which patient tolerated ?

## 2022-03-15 NOTE — Progress Notes (Addendum)
? ?  Please reference full Cardiology consult note from 03/14/2022. Overall intermediate-risk for upcoming GI procedures from a cardiac perspective. GI is planning for likely ERCP today and General Surgery is following with plans for a laparoscopic cholecystectomy on 3/22. Eliquis has been held and being bridged with Heparin (stopped this AM for her planned procedures). Telemetry reviewed and she has remained in a V-paced rhythm with rates in the 70's. No additional cardiac testing planned at this time.  ? ?Signed, ?Erma Heritage, PA-C ?03/15/2022, 8:12 AM ? ?Satira Sark, M.D., F.A.C.C. ? ?

## 2022-03-15 NOTE — Assessment & Plan Note (Signed)
IV fluid hydration ordered.  ?

## 2022-03-15 NOTE — Anesthesia Preprocedure Evaluation (Signed)
Anesthesia Evaluation  ?Patient identified by MRN, date of birth, ID band ?Patient awake ? ? ? ?Reviewed: ?Allergy & Precautions, NPO status , Patient's Chart, lab work & pertinent test results, reviewed documented beta blocker date and time  ? ?History of Anesthesia Complications ?Negative for: history of anesthetic complications ? ?Airway ?Mallampati: II ? ?TM Distance: >3 FB ?Neck ROM: Full ? ? ? Dental ? ?(+) Dental Advisory Given, Edentulous Upper, Missing ?  ?Pulmonary ?PE ?  ?Pulmonary exam normal ?breath sounds clear to auscultation ? ? ? ? ? ? Cardiovascular ?METS: 3 - Mets hypertension, Pt. on medications and Pt. on home beta blockers ?+CHF  ?Normal cardiovascular exam+ dysrhythmias + pacemaker  ?Rhythm:Regular Rate:Normal ? ?The left ventricular systolic function is normal. ?LV end diastolic pressure is mildly elevated. LVEDP 17 mm Hg. ?The left ventricular ejection fraction is 55-65% by visual estimate. ?There is no aortic valve stenosis. ?Mild nonobstructive coroanry artery disease. ?Tortuous vessels. Catheter induced spasm of the ostial RCA. ?  ?Mild radial vasospasm.  Continue preventive therapy.  Low level troponin elevation may be related to her rhythm disturbance noted on ECG ? ?  ?Neuro/Psych ?negative neurological ROS ? negative psych ROS  ? GI/Hepatic ?Neg liver ROS, GERD  ,  ?Endo/Other  ?negative endocrine ROSdiabetes, Well Controlled, Type 2, Oral Hypoglycemic Agents, Insulin Dependent ? Renal/GU ?Renal InsufficiencyRenal disease  ?negative genitourinary ?  ?Musculoskeletal ? ?(+) Arthritis , Osteoarthritis,   ? Abdominal ?  ?Peds ?negative pediatric ROS ?(+)  Hematology ? ?(+) Blood dyscrasia, anemia ,   ?Anesthesia Other Findings ? ? Reproductive/Obstetrics ?negative OB ROS ? ?  ? ? ? ? ? ? ? ? ? ? ? ? ? ?  ?  ? ? ? ? ? ? ? ? ?Anesthesia Physical ?Anesthesia Plan ? ?ASA: 3 ? ?Anesthesia Plan: General  ? ?Post-op Pain Management: Minimal or no pain  anticipated  ? ?Induction: Intravenous ? ?PONV Risk Score and Plan: TIVA ? ?Airway Management Planned: Nasal Cannula and Natural Airway ? ?Additional Equipment:  ? ?Intra-op Plan:  ? ?Post-operative Plan:  ? ?Informed Consent: I have reviewed the patients History and Physical, chart, labs and discussed the procedure including the risks, benefits and alternatives for the proposed anesthesia with the patient or authorized representative who has indicated his/her understanding and acceptance.  ? ? ? ?Dental advisory given ? ?Plan Discussed with: CRNA and Surgeon ? ?Anesthesia Plan Comments:   ? ? ? ? ? ? ?Anesthesia Quick Evaluation ? ?

## 2022-03-15 NOTE — Progress Notes (Signed)
?Subjective: ? ?Patient has no complaints.  She denies chest pain shortness of breath or abdominal pain.  She is hungry. ? ?Current Medications: ? ?Current Facility-Administered Medications:  ?  [MAR Hold] acetaminophen (TYLENOL) tablet 650 mg, 650 mg, Oral, Q6H PRN **OR** [MAR Hold] acetaminophen (TYLENOL) suppository 650 mg, 650 mg, Rectal, Q6H PRN, Manuella Ghazi, Pratik D, DO ?  [MAR Hold] cefTRIAXone (ROCEPHIN) 2 g in sodium chloride 0.9 % 100 mL IVPB, 2 g, Intravenous, Q24H, Last Rate: 200 mL/hr at 03/15/22 0536, 2 g at 03/15/22 0536 **AND** [MAR Hold] metroNIDAZOLE (FLAGYL) IVPB 500 mg, 500 mg, Intravenous, Q12H, Shah, Pratik D, DO, Last Rate: 100 mL/hr at 03/15/22 0850, 500 mg at 03/15/22 0850 ?  [MAR Hold] feeding supplement (BOOST / RESOURCE BREEZE) liquid 1 Container, 1 Container, Oral, TID BM, Zierle-Ghosh, Asia B, DO, 1 Container at 03/14/22 2103 ?  glucagon (human recombinant) (GLUCAGEN) 1 MG injection, , , ,  ?  [MAR Hold] HYDROmorphone (DILAUDID) injection 0.5-1 mg, 0.5-1 mg, Intravenous, Q4H PRN, Johnson, Clanford L, MD ?  Doug Sou Hold] insulin aspart (novoLOG) injection 0-9 Units, 0-9 Units, Subcutaneous, TID WC, Johnson, Clanford L, MD, 1 Units at 03/15/22 1149 ?  lactated ringers infusion, , Intravenous, Continuous, Johnson, Clanford L, MD, Last Rate: 65 mL/hr at 03/15/22 0615, New Bag at 03/15/22 0615 ?  [MAR Hold] metoprolol succinate (TOPROL-XL) 24 hr tablet 50 mg, 50 mg, Oral, Daily, Manuella Ghazi, Pratik D, DO, 50 mg at 03/15/22 9242 ?  [MAR Hold] multivitamin with minerals tablet 1 tablet, 1 tablet, Oral, Daily, Wynetta Emery, Clanford L, MD, 1 tablet at 03/15/22 6834 ?  [MAR Hold] ondansetron (ZOFRAN) injection 4 mg, 4 mg, Intravenous, Q6H PRN, Manuella Ghazi, Pratik D, DO ?  [MAR Hold] ondansetron (ZOFRAN) tablet 4 mg, 4 mg, Oral, Q6H PRN **OR** [MAR Hold] ondansetron (ZOFRAN) injection 4 mg, 4 mg, Intravenous, Q6H PRN, Manuella Ghazi, Pratik D, DO ?  [MAR Hold] pantoprazole (PROTONIX) injection 40 mg, 40 mg, Intravenous, Q24H,  Amorie Rentz U, MD, 40 mg at 03/15/22 1149 ?  sodium chloride 0.9 % infusion, , , ,   ? ?Objective: ?Blood pressure (!) 152/64, pulse 79, temperature 99.6 ?F (37.6 ?C), temperature source Oral, resp. rate (!) 23, height $RemoveBe'5\' 4"'uKWLGbeoX$  (1.626 m), weight 80.1 kg, SpO2 98 %. ?Patient is alert and in no acute distress. ?Cardiac exam with regular rhythm normal S1 and S2.  No murmur or gallop noted. ?Auscultation lungs reveal vesicular breath sounds bilaterally. ?Abdomen is soft and nontender.  Cholecystostomy tube with entry site in right upper quadrant.  No organomegaly or masses. ?No peripheral edema. ? ?Labs/studies Results: ? ? ?CBC Latest Ref Rng & Units 03/15/2022 03/14/2022 03/13/2022  ?WBC 4.0 - 10.5 K/uL 6.9 6.4 11.3(H)  ?Hemoglobin 12.0 - 15.0 g/dL 9.3(L) 8.5(L) 11.1(L)  ?Hematocrit 36.0 - 46.0 % 27.7(L) 27.4(L) 36.2  ?Platelets 150 - 400 K/uL 211 168 188  ?  ?CMP Latest Ref Rng & Units 03/15/2022 03/14/2022 03/13/2022  ?Glucose 70 - 99 mg/dL 169(H) 122(H) 225(H)  ?BUN 8 - 23 mg/dL <5(L) 9 17  ?Creatinine 0.44 - 1.00 mg/dL 0.71 0.73 1.09(H)  ?Sodium 135 - 145 mmol/L 140 140 137  ?Potassium 3.5 - 5.1 mmol/L 3.4(L) 3.2(L) 3.7  ?Chloride 98 - 111 mmol/L 109 106 98  ?CO2 22 - 32 mmol/L $RemoveB'25 25 27  'GbJllvWB$ ?Calcium 8.9 - 10.3 mg/dL 7.7(L) 7.7(L) 8.6(L)  ?Total Protein 6.5 - 8.1 g/dL 5.9(L) 5.7(L) 7.7  ?Total Bilirubin 0.3 - 1.2 mg/dL 0.2(L) 0.3 1.6(H)  ?Alkaline Phos 38 -  126 U/L 71 83 154(H)  ?AST 15 - 41 U/L 16 32 138(H)  ?ALT 0 - 44 U/L 19 28 55(H)  ?  ?Hepatic Function Latest Ref Rng & Units 03/15/2022 03/14/2022 03/13/2022  ?Total Protein 6.5 - 8.1 g/dL 5.9(L) 5.7(L) 7.7  ?Albumin 3.5 - 5.0 g/dL 1.9(L) 1.9(L) 2.7(L)  ?AST 15 - 41 U/L 16 32 138(H)  ?ALT 0 - 44 U/L 19 28 55(H)  ?Alk Phosphatase 38 - 126 U/L 71 83 154(H)  ?Total Bilirubin 0.3 - 1.2 mg/dL 0.2(L) 0.3 1.6(H)  ?Bilirubin, Direct 0.0 - 0.2 mg/dL - - -  ? ?Assessment: ? ?#1.  Biliary pancreatitis.  Patient's transaminases have returned to normal and so has serum lipase.  She has  developed choledocholithiasis on sludge since she had tube study of November 2022.  She is at risk for recurrent pancreatitis until duct is cleared of stone and/or sludge. ?Heparin was discontinued about 6 hours ago. ?Patient has been evaluated by Dr. Johnny Bridge of cardiology service and deemed to be stable for the procedure.  She has history of nonobstructive coronary artery disease and diastolic dysfunction. ? ?#2.  History of acute cholecystitis July 2022 treated with cholecystostomy.  She has had this tube change periodically.  She will undergo cholecystectomy possibly tomorrow. ? ?#3.  History of pulmonary embolism.  Anticoagulation on hold. ? ?#4.  Anemia.  Acute on chronic anemia possibly due to acute illness.  No evidence of GI bleed. ? ? ?Plan: ? ?Proceed with ERCP with biliary sphincterotomy and stone/sludge extraction.  Procedure risks reviewed with patient again in she is agreeable.  Procedure was also reviewed with her daughter Audelia Acton when I saw her in consultation 2 days ago. ? ? ? ? ? ?

## 2022-03-15 NOTE — Anesthesia Procedure Notes (Signed)
Procedure Name: Intubation ?Date/Time: 03/15/2022 1:41 PM ?Performed by: Karna Dupes, CRNA ?Pre-anesthesia Checklist: Patient identified, Emergency Drugs available, Suction available and Patient being monitored ?Patient Re-evaluated:Patient Re-evaluated prior to induction ?Oxygen Delivery Method: Circle system utilized ?Preoxygenation: Pre-oxygenation with 100% oxygen ?Induction Type: IV induction, Rapid sequence and Cricoid Pressure applied ?Laryngoscope Size: Mac and 3 ?Grade View: Grade I ?Tube type: Oral ?Tube size: 7.0 mm ?Number of attempts: 1 ?Airway Equipment and Method: Stylet ?Placement Confirmation: ETT inserted through vocal cords under direct vision, positive ETCO2 and breath sounds checked- equal and bilateral ?Secured at: 21 cm ?Tube secured with: Tape ?Dental Injury: Teeth and Oropharynx as per pre-operative assessment  ? ? ? ? ?

## 2022-03-15 NOTE — Transfer of Care (Signed)
Immediate Anesthesia Transfer of Care Note ? ?Patient: SOPHYA VANBLARCOM ? ?Procedure(s) Performed: ENDOSCOPIC RETROGRADE CHOLANGIOPANCREATOGRAPHY (ERCP) WITH PROPOFOL ? ?Patient Location: PACU ? ?Anesthesia Type:General ? ?Level of Consciousness: awake ? ?Airway & Oxygen Therapy: Patient Spontanous Breathing and Patient connected to nasal cannula oxygen ? ?Post-op Assessment: Report given to RN and Post -op Vital signs reviewed and stable ? ?Post vital signs: Reviewed and stable ? ?Last Vitals:  ?Vitals Value Taken Time  ?BP 139/63   ?Temp 99.1   ?Pulse 80 03/15/22 1428  ?Resp    ?SpO2 97% 03/15/22 1428  ?Vitals shown include unvalidated device data. ? ?Last Pain:  ?Vitals:  ? 03/15/22 1211  ?TempSrc: Oral  ?PainSc: 0-No pain  ?   ? ?Patients Stated Pain Goal: 5 (03/15/22 1211) ? ?Complications: No notable events documented. ?

## 2022-03-15 NOTE — Progress Notes (Signed)
?PROGRESS NOTE ? ? ?Natalie Castro  IPJ:[id number] DOB: 06-20-1941 DOA: 03/13/2022 ?PCP: Redmond School, MD  ? ?Chief Complaint  ?Patient presents with  ? Emesis  ? Abdominal Pain  ? ?Level of care: Telemetry ? ?Brief Admission History:  ?81 y.o. female with medical history significant of CAD, type 2 diabetes, complete heart block status post permanent pacemaker, hypertension, dyslipidemia, pulmonary embolus on apixaban, chronic diastolic CHF, and chronic cholecystitis with percutaneous drain who presented to the ED with worsening generalized abdominal pain as well as nausea and vomiting that appears to have began in the last 1 day.  She was noted to have a fever as well.  Upon chart review, it appears that she was supposed to have her percutaneous cholecystostomy drain replaced on 3/3 due to the fact that it was dysfunctional, it does not appear to have been done.  Patient is quite lethargic at this time and cannot give any further history. ?  ?In the ED, CT scan of the abdomen and pelvis was performed with no acute process mentioned.  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  Cholecystostomy drain appears to be in good position.  She is noted to have significant LFT elevation and lipase is over 9000.  She has been started on 2 L fluid bolus and has been given Rocephin and Flagyl. ? ?03/14/2022:  Pt feeling much better.  Lipase way down to 51. Tolerating clear liquid diet.  Cardiology preop eval reporting intermediate risk for surgery.  Awaiting anesthesia team to give opinion.  ? ?03/15/2022:  ERCP with Dr. Laural Golden.  Anticipating cholecystectomy with Dr. Constance Haw on 03/16/22.   ?  ?Assessment and Plan: ?Acute pancreatitis ?Supportive treatments as noted.  ? ?Sepsis ([place]) ?Sepsis physiology has resolved with supportive measures.  ? ?Transaminitis ?LFTs are normalized. Lipase is down to 51.  ? ?Acute on chronic cholecystitis ?Surgery and GI is involved.  ?In discussion for possible  ERCP on 3/21.  ? ?Pancreatic duct dilated ?ERCP on 3/21 with Dr. Laural Golden.  ? ?Hypokalemia ?IV replacement ordered.  ?Magnesium repleted.  ?Recheck bmp in AM ? 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  ? ?CBG (last 3)  ?Recent Labs  ?  03/15/22 ?0423 03/15/22 ?0713 03/15/22 ?1127  ?GLUCAP 168* 142* 121*  ? ? ? ?Hypomagnesemia ?IV replacement ordered 3/20 and repleted ?  ? ?History of pulmonary embolus (PE) ?She is on longterm full anticoagulation.  ?Apixaban on hold.  IV heparin dosed per pharm D ? ?Dehydration ?IV fluid hydration ordered.  ? ?Pacemaker ?Pt was evaluated by cardiology and noted that pacemaker has been functioning normally.  ? ?DVT prophylaxis: IV heparin infusion  ?Code Status: Full  ?Family Communication:  ?Disposition: Status is: Inpatient ?Remains inpatient appropriate because: IV heparin infusion, IV fluids ?  ?Consultants:  ?GI ?Surgery ?Cardiology ?Anesthesia  ?Procedures:  ?TBD ?Antimicrobials:  ?Ceftriaxone 3/19>> ?Metronidazole 3/19>>  ?Subjective: ?Pt reports no specific complaints, agreeable to proceeding with ERCP today.   ?Objective: ?Vitals:  ? 03/14/22 1225 03/14/22 2130 03/15/22 0420 03/15/22 1211  ?BP: (!) 153/81 (!) 143/55 (!) 147/64 (!) 152/64  ?Pulse: 74 78 78 79  ?Resp: '17 20 18 '$ (!) 23  ?Temp: 99.2 ?F (37.3 ?C) 99.1 ?F (37.3 ?C) 98.9 ?F (37.2 ?C) 99.6 ?F (37.6 ?C)  ?TempSrc: Oral Oral Oral Oral  ?SpO2: 99% 97% 97% 98%  ?Weight:      ?Height:      ? ? ?  Intake/Output Summary (Last 24 hours) at 03/15/2022 1226 ?Last data filed at 03/15/2022 0900 ?Gross per 24 hour  ?Intake 3486.87 ml  ?Output 1625 ml  ?Net 1861.87 ml  ? ?Filed Weights  ? 03/13/22 1100  ?Weight: 80.1 kg  ? ?Examination: ? ?General exam: awake, alert, sitting up in chair, NAD, Appears calm and comfortable  ?Respiratory system: Clear to auscultation. Respiratory effort normal. ?Cardiovascular system: normal S1 & S2 heard. No JVD, murmurs, rubs, gallops or clicks. No  pedal edema. ?Gastrointestinal system: Abdomen is nondistended, soft and mild tenderness RUQ. No organomegaly or masses felt. Normal bowel sounds heard. ?Central nervous system: Alert and oriented. No focal neurological deficits. ?Extremities: Symmetric 5 x 5 power. ?Skin: No rashes, lesions or ulcers. ?Psychiatry: Judgement and insight appear normal. Mood & affect appropriate.  ? ?Data Reviewed: I have personally reviewed following labs and imaging studies ? ?CBC: ?Recent Labs  ?Lab 03/13/22 ?0115 03/14/22 ?0419 03/15/22 ?1583  ?WBC 11.3* 6.4 6.9  ?NEUTROABS 9.6*  --   --   ?HGB 11.1* 8.5* 9.3*  ?HCT 36.2 27.4* 27.7*  ?MCV 94.3 96.5 94.9  ?PLT 188 168 211  ? ? ?Basic Metabolic Panel: ?Recent Labs  ?Lab 03/13/22 ?0115 03/14/22 ?0419 03/15/22 ?0940  ?NA 137 140 140  ?K 3.7 3.2* 3.4*  ?CL 98 106 109  ?CO2 '27 25 25  '$ ?GLUCOSE 225* 122* 169*  ?BUN 17 9 <5*  ?CREATININE 1.09* 0.73 0.71  ?CALCIUM 8.6* 7.7* 7.7*  ?MG  --  1.5* 2.0  ? ? ?CBG: ?Recent Labs  ?Lab 03/14/22 ?2001 03/15/22 ?0010 03/15/22 ?0423 03/15/22 ?7680 03/15/22 ?1127  ?GLUCAP 124* 216* 168* 142* 121*  ? ? ?Recent Results (from the past 240 hour(s))  ?Culture, blood (Routine x 2)     Status: Abnormal  ? Collection Time: 03/13/22  1:15 AM  ? Specimen: BLOOD RIGHT WRIST  ?Result Value Ref Range Status  ? Specimen Description   Final  ?  BLOOD RIGHT WRIST BOTTLES DRAWN AEROBIC AND ANAEROBIC ?Performed at [facility], [address], [place], [place] [address] ?  ? Special Requests   Final  ?  Blood Culture results may not be optimal due to an inadequate volume of blood received in culture bottles ?Performed at [facility], [address], [place], [place] [address] ?  ? Culture  Setup Time   Final  ?  GRAM NEGATIVE RODS ANAEROBIC BOTTLE ONLY Gram Stain Report Called to,Read Back By and Verified With: A.GOFF @ 1330 03/13/22 BY STEPHTR ?CRITICAL RESULT CALLED TO, READ BACK BY AND VERIFIED WITH: RN A.CRADDOCK ON [id number] AT 4628 BY E.PARRISH ?Performed at [facility], [place] [address] [place], [place] [address] ?  ? Culture ESCHERICHIA COLI (A)  Final  ? Report Status 03/15/2022 FINAL  Final  ? Organism ID, Bacteria ESCHERICHIA COLI  Final  ?    Susceptibility  ? Escherichia coli - MIC*  ?  AMPICILLIN <=2 SENSITIVE Sensitive   ?  CEFAZOLIN <=4 SENSITIVE Sensitive   ?  CEFEPIME <=0.12 SENSITIVE Sensitive   ?  CEFTAZIDIME <=1 SENSITIVE Sensitive   ?  CEFTRIAXONE <=0.25 SENSITIVE Sensitive   ?  CIPROFLOXACIN <=0.25 SENSITIVE Sensitive   ?  GENTAMICIN <=1 SENSITIVE Sensitive   ?  IMIPENEM <=0.25 SENSITIVE Sensitive   ?  TRIMETH/SULFA <=20 SENSITIVE Sensitive   ?  AMPICILLIN/SULBACTAM <=2 SENSITIVE Sensitive   ?  PIP/TAZO <=4 SENSITIVE Sensitive   ?  * ESCHERICHIA COLI  ?Culture, blood (Routine x 2)  Status: None (Preliminary result)  ? Collection Time: 03/13/22  1:15 AM  ? Specimen: Right Antecubital; Blood  ?Result Value Ref Range Status  ? Specimen Description   Final  ?  RIGHT ANTECUBITAL BOTTLES DRAWN AEROBIC AND ANAEROBIC  ? Special Requests Blood Culture adequate volume  Final  ? Culture   Final  ?  NO GROWTH 2 DAYS ?Performed at [facility], [address], [place], [place] [address] ?  ? Report Status PENDING  Incomplete  ?Blood Culture ID Panel (Reflexed)     Status: Abnormal  ? Collection Time: 03/13/22  1:15 AM  ?Result Value Ref Range Status  ? Enterococcus faecalis NOT DETECTED NOT DETECTED Final  ? Enterococcus Faecium NOT DETECTED NOT DETECTED Final  ? Listeria monocytogenes NOT DETECTED NOT DETECTED Final  ? Staphylococcus species NOT DETECTED NOT DETECTED Final  ? Staphylococcus aureus (BCID) NOT DETECTED NOT DETECTED Final  ? Staphylococcus epidermidis NOT DETECTED NOT DETECTED Final  ? Staphylococcus lugdunensis NOT DETECTED NOT DETECTED Final  ? Streptococcus species NOT DETECTED NOT DETECTED Final  ? Streptococcus agalactiae NOT DETECTED NOT DETECTED Final  ? Streptococcus pneumoniae NOT DETECTED NOT DETECTED Final  ? Streptococcus pyogenes NOT  DETECTED NOT DETECTED Final  ? A.calcoaceticus-baumannii NOT DETECTED NOT DETECTED Final  ? Bacteroides fragilis NOT DETECTED NOT DETECTED Final  ? Enterobacterales DETECTED (A) NOT DETECTED Final  ?  Comment

## 2022-03-15 NOTE — Progress Notes (Signed)
Brief ERCP note. ? ?Very prominent ampulla Vater with long intramural segment. ?Dilated CBD and CHD with large long tubular filling defect filling duct consistent with stone and sludge. ?Biliary sphincterotomy performed and stone/sludge removed with Damia basket and stone balloon extractor. ?Patient tolerated procedure well. ?PD was not cannulated or filled with contrast. ?

## 2022-03-16 ENCOUNTER — Encounter (HOSPITAL_COMMUNITY)
Admission: EM | Disposition: A | Discharge status: Home Health | Payer: Self-pay | Source: Home / Self Care | Attending: Internal Medicine

## 2022-03-16 ENCOUNTER — Inpatient Hospital Stay (HOSPITAL_COMMUNITY): Payer: Medicare HMO | Admitting: Anesthesiology

## 2022-03-16 ENCOUNTER — Encounter (HOSPITAL_COMMUNITY): Payer: Self-pay | Admitting: Family Medicine

## 2022-03-16 DIAGNOSIS — K851 Biliary acute pancreatitis without necrosis or infection: Secondary | ICD-10-CM | POA: Diagnosis not present

## 2022-03-16 DIAGNOSIS — I5032 Chronic diastolic (congestive) heart failure: Secondary | ICD-10-CM | POA: Diagnosis not present

## 2022-03-16 DIAGNOSIS — I442 Atrioventricular block, complete: Secondary | ICD-10-CM

## 2022-03-16 DIAGNOSIS — K811 Chronic cholecystitis: Secondary | ICD-10-CM | POA: Diagnosis not present

## 2022-03-16 DIAGNOSIS — B9620 Unspecified Escherichia coli [E. coli] as the cause of diseases classified elsewhere: Secondary | ICD-10-CM

## 2022-03-16 DIAGNOSIS — R7881 Bacteremia: Secondary | ICD-10-CM

## 2022-03-16 DIAGNOSIS — I110 Hypertensive heart disease with heart failure: Secondary | ICD-10-CM

## 2022-03-16 DIAGNOSIS — I509 Heart failure, unspecified: Secondary | ICD-10-CM

## 2022-03-16 DIAGNOSIS — M1990 Unspecified osteoarthritis, unspecified site: Secondary | ICD-10-CM

## 2022-03-16 DIAGNOSIS — K8044 Calculus of bile duct with chronic cholecystitis without obstruction: Secondary | ICD-10-CM

## 2022-03-16 DIAGNOSIS — N6310 Unspecified lump in the right breast, unspecified quadrant: Secondary | ICD-10-CM

## 2022-03-16 HISTORY — PX: CHOLECYSTECTOMY: SHX55

## 2022-03-16 LAB — BASIC METABOLIC PANEL
Anion gap: 8 (ref 5–15)
BUN: <5 mg/dL — ABNORMAL LOW (ref 8–23)
CO2: 24 mmol/L (ref 22–32)
Calcium: 7.5 mg/dL — ABNORMAL LOW (ref 8.9–10.3)
Chloride: 108 mmol/L (ref 98–111)
Creatinine, Ser: 0.79 mg/dL (ref 0.44–1.00)
GFR, Estimated: >60 mL/min (ref >60)
Glucose, Bld: 134 mg/dL — ABNORMAL HIGH (ref 70–99)
Potassium: 3.7 mmol/L (ref 3.5–5.1)
Sodium: 140 mmol/L (ref 135–145)

## 2022-03-16 LAB — CBC
HCT: 28.5 % — ABNORMAL LOW (ref 36.0–46.0)
Hemoglobin: 8.6 g/dL — ABNORMAL LOW (ref 12.0–15.0)
MCH: 29.3 pg (ref 26.0–34.0)
MCHC: 30.2 g/dL (ref 30.0–36.0)
MCV: 96.9 fL (ref 80.0–100.0)
Platelets: 211 10*3/uL (ref 150–400)
RBC: 2.94 MIL/uL — ABNORMAL LOW (ref 3.87–5.11)
RDW: 13.8 % (ref 11.5–15.5)
WBC: 9.4 10*3/uL (ref 4.0–10.5)
nRBC: 0 % (ref 0.0–0.2)

## 2022-03-16 LAB — GLUCOSE, CAPILLARY
Glucose-Capillary: 125 mg/dL — ABNORMAL HIGH (ref 70–99)
Glucose-Capillary: 128 mg/dL — ABNORMAL HIGH (ref 70–99)
Glucose-Capillary: 130 mg/dL — ABNORMAL HIGH (ref 70–99)
Glucose-Capillary: 139 mg/dL — ABNORMAL HIGH (ref 70–99)
Glucose-Capillary: 197 mg/dL — ABNORMAL HIGH (ref 70–99)
Glucose-Capillary: 231 mg/dL — ABNORMAL HIGH (ref 70–99)

## 2022-03-16 LAB — SURGICAL PCR SCREEN
MRSA, PCR: NEGATIVE
Staphylococcus aureus: NEGATIVE

## 2022-03-16 LAB — LIPASE, BLOOD: Lipase: 28 U/L (ref 11–51)

## 2022-03-16 SURGERY — LAPAROSCOPIC CHOLECYSTECTOMY
Anesthesia: General | Site: Abdomen

## 2022-03-16 MED ORDER — SUCCINYLCHOLINE CHLORIDE 200 MG/10ML IV SOSY
PREFILLED_SYRINGE | INTRAVENOUS | Status: AC
  Filled 2022-03-16: qty 10

## 2022-03-16 MED ORDER — CHLORHEXIDINE GLUCONATE 0.12 % MT SOLN
15.0000 mL | Freq: Once | OROMUCOSAL | Status: AC
  Administered 2022-03-16: 15 mL via OROMUCOSAL

## 2022-03-16 MED ORDER — ORAL CARE MOUTH RINSE: 15.0000 mL | Freq: Once | OROMUCOSAL | Status: DC

## 2022-03-16 MED ORDER — EPHEDRINE SULFATE (PRESSORS) 50 MG/ML IJ SOLN
INTRAMUSCULAR | Status: DC | PRN
  Administered 2022-03-16 (×3): 10 mg via INTRAVENOUS

## 2022-03-16 MED ORDER — OXYCODONE HCL 5 MG PO TABS: 5.0000 mg | ORAL_TABLET | ORAL | Status: DC | PRN

## 2022-03-16 MED ORDER — PROPOFOL 10 MG/ML IV BOLUS
INTRAVENOUS | Status: DC | PRN
Start: 2022-03-16 — End: 2022-03-16
  Administered 2022-03-16: 120 mg via INTRAVENOUS

## 2022-03-16 MED ORDER — DEXAMETHASONE SODIUM PHOSPHATE 10 MG/ML IJ SOLN
INTRAMUSCULAR | Status: AC
Start: 2022-03-16 — End: ?
  Filled 2022-03-16: qty 1

## 2022-03-16 MED ORDER — LACTATED RINGERS IV SOLN: INTRAVENOUS | Status: DC

## 2022-03-16 MED ORDER — ORAL CARE MOUTH RINSE: 15.0000 mL | Freq: Once | OROMUCOSAL | Status: AC

## 2022-03-16 MED ORDER — HYDROMORPHONE HCL 1 MG/ML IJ SOLN: 0.2500 mg | INTRAMUSCULAR | Status: DC | PRN

## 2022-03-16 MED ORDER — BUPIVACAINE HCL (PF) 0.5 % IJ SOLN
INTRAMUSCULAR | Status: AC
  Filled 2022-03-16: qty 30

## 2022-03-16 MED ORDER — CHLORHEXIDINE GLUCONATE CLOTH 2 % EX PADS: 6.0000 | MEDICATED_PAD | Freq: Once | CUTANEOUS | Status: DC

## 2022-03-16 MED ORDER — SUCCINYLCHOLINE CHLORIDE 200 MG/10ML IV SOSY
PREFILLED_SYRINGE | INTRAVENOUS | Status: DC | PRN
Start: 2022-03-16 — End: 2022-03-16
  Administered 2022-03-16: 120 mg via INTRAVENOUS

## 2022-03-16 MED ORDER — PHENYLEPHRINE HCL (PRESSORS) 10 MG/ML IV SOLN
INTRAVENOUS | Status: DC | PRN
  Administered 2022-03-16: 80 ug via INTRAVENOUS

## 2022-03-16 MED ORDER — ROCURONIUM BROMIDE 10 MG/ML (PF) SYRINGE
PREFILLED_SYRINGE | INTRAVENOUS | Status: AC
  Filled 2022-03-16: qty 10

## 2022-03-16 MED ORDER — FENTANYL CITRATE (PF) 100 MCG/2ML IJ SOLN
INTRAMUSCULAR | Status: DC | PRN
Start: 2022-03-16 — End: 2022-03-16
  Administered 2022-03-16: 50 ug via INTRAVENOUS
  Administered 2022-03-16: 100 ug via INTRAVENOUS

## 2022-03-16 MED ORDER — LIDOCAINE HCL (PF) 2 % IJ SOLN
INTRAMUSCULAR | Status: AC
  Filled 2022-03-16: qty 5

## 2022-03-16 MED ORDER — CHLORHEXIDINE GLUCONATE 0.12 % MT SOLN: 15.0000 mL | Freq: Once | OROMUCOSAL | Status: DC

## 2022-03-16 MED ORDER — LIDOCAINE HCL (CARDIAC) PF 100 MG/5ML IV SOSY
PREFILLED_SYRINGE | INTRAVENOUS | Status: DC | PRN
  Administered 2022-03-16: 40 mg via INTRATRACHEAL

## 2022-03-16 MED ORDER — CIPROFLOXACIN IN D5W 400 MG/200ML IV SOLN
400.0000 mg | INTRAVENOUS | Status: AC
  Administered 2022-03-16: 400 mg via INTRAVENOUS
  Filled 2022-03-16: qty 200

## 2022-03-16 MED ORDER — BUPIVACAINE HCL (PF) 0.5 % IJ SOLN
INTRAMUSCULAR | Status: DC | PRN
  Administered 2022-03-16: 10 mL

## 2022-03-16 MED ORDER — LACTATED RINGERS IV SOLN
INTRAVENOUS | Status: DC
  Administered 2022-03-16: 1000 mL via INTRAVENOUS

## 2022-03-16 MED ORDER — CHLORHEXIDINE GLUCONATE CLOTH 2 % EX PADS
6.0000 | MEDICATED_PAD | Freq: Once | CUTANEOUS | Status: AC
  Administered 2022-03-16: 6 via TOPICAL

## 2022-03-16 MED ORDER — DEXAMETHASONE SODIUM PHOSPHATE 10 MG/ML IJ SOLN
INTRAMUSCULAR | Status: DC | PRN
Start: 2022-03-16 — End: 2022-03-16
  Administered 2022-03-16: 10 mg via INTRAVENOUS

## 2022-03-16 MED ORDER — HEMOSTATIC AGENTS (NO CHARGE) OPTIME
TOPICAL | Status: DC | PRN
  Administered 2022-03-16: 1 via TOPICAL

## 2022-03-16 MED ORDER — SODIUM CHLORIDE 0.9 % IV SOLN: INTRAVENOUS | Status: DC

## 2022-03-16 MED ORDER — ROCURONIUM BROMIDE 10 MG/ML (PF) SYRINGE
PREFILLED_SYRINGE | INTRAVENOUS | Status: DC | PRN
  Administered 2022-03-16: 40 mg via INTRAVENOUS

## 2022-03-16 MED ORDER — FENTANYL CITRATE (PF) 100 MCG/2ML IJ SOLN
INTRAMUSCULAR | Status: AC
Start: 2022-03-16 — End: ?
  Filled 2022-03-16: qty 2

## 2022-03-16 MED ORDER — SUGAMMADEX SODIUM 500 MG/5ML IV SOLN
INTRAVENOUS | Status: DC | PRN
  Administered 2022-03-16: 200 mg via INTRAVENOUS

## 2022-03-16 MED ORDER — ONDANSETRON HCL 4 MG/2ML IJ SOLN
INTRAMUSCULAR | Status: AC
  Filled 2022-03-16: qty 4

## 2022-03-16 MED ORDER — ONDANSETRON HCL 4 MG/2ML IJ SOLN
INTRAMUSCULAR | Status: DC | PRN
  Administered 2022-03-16: 4 mg via INTRAVENOUS

## 2022-03-16 MED ORDER — SODIUM CHLORIDE 0.9 % IR SOLN
Status: DC | PRN
  Administered 2022-03-16: 1000 mL

## 2022-03-16 MED ORDER — FENTANYL CITRATE (PF) 250 MCG/5ML IJ SOLN
INTRAMUSCULAR | Status: AC
  Filled 2022-03-16: qty 5

## 2022-03-16 SURGICAL SUPPLY — 46 items
ADH SKN CLS APL DERMABOND .7 (GAUZE/BANDAGES/DRESSINGS) ×1
APL PRP STRL LF DISP 70% ISPRP (MISCELLANEOUS) ×1
APPLIER CLIP ROT 10 11.4 M/L (STAPLE) ×2
APR CLP MED LRG 11.4X10 (STAPLE) ×1
BAG RETRIEVAL 10 (BASKET) ×1
BLADE SURG 15 STRL LF DISP TIS (BLADE) ×2 IMPLANT
BLADE SURG 15 STRL SS (BLADE) ×2
CHLORAPREP W/TINT 26 (MISCELLANEOUS) ×3 IMPLANT
CLIP APPLIE ROT 10 11.4 M/L (STAPLE) ×2 IMPLANT
CLOTH BEACON ORANGE TIMEOUT ST (SAFETY) ×3 IMPLANT
COVER LIGHT HANDLE STERIS (MISCELLANEOUS) ×6 IMPLANT
CUTTER FLEX LINEAR 45M (STAPLE) ×1 IMPLANT
DECANTER SPIKE VIAL GLASS SM (MISCELLANEOUS) ×3 IMPLANT
DERMABOND ADVANCED (GAUZE/BANDAGES/DRESSINGS) ×1
DERMABOND ADVANCED .7 DNX12 (GAUZE/BANDAGES/DRESSINGS) ×2 IMPLANT
DRSG TEGADERM 2-3/8X2-3/4 SM (GAUZE/BANDAGES/DRESSINGS) ×1 IMPLANT
ELECT REM PT RETURN 9FT ADLT (ELECTROSURGICAL) ×2
ELECTRODE REM PT RTRN 9FT ADLT (ELECTROSURGICAL) ×2 IMPLANT
GAUZE 4X4 16PLY ~~LOC~~+RFID DBL (SPONGE) ×3 IMPLANT
GAUZE SPONGE 4X4 12PLY STRL (GAUZE/BANDAGES/DRESSINGS) ×1 IMPLANT
GLOVE SURG ENC MOIS LTX SZ6.5 (GLOVE) ×3 IMPLANT
GLOVE SURG UNDER POLY LF SZ7 (GLOVE) ×12 IMPLANT
GOWN STRL REUS W/TWL LRG LVL3 (GOWN DISPOSABLE) ×9 IMPLANT
HEMOSTAT SNOW SURGICEL 2X4 (HEMOSTASIS) ×3 IMPLANT
INST SET LAPROSCOPIC AP (KITS) ×3 IMPLANT
KIT TURNOVER KIT A (KITS) ×3 IMPLANT
MANIFOLD NEPTUNE II (INSTRUMENTS) ×3 IMPLANT
NDL INSUFFLATION 14GA 120MM (NEEDLE) ×2 IMPLANT
NEEDLE INSUFFLATION 14GA 120MM (NEEDLE) ×2 IMPLANT
NS IRRIG 1000ML POUR BTL (IV SOLUTION) ×3 IMPLANT
PACK LAP CHOLE LZT030E (CUSTOM PROCEDURE TRAY) ×3 IMPLANT
PAD ARMBOARD 7.5X6 YLW CONV (MISCELLANEOUS) ×3 IMPLANT
RELOAD 45 VASCULAR/THIN (ENDOMECHANICALS) ×2 IMPLANT
RELOAD STAPLE 45 2.5 WHT GRN (ENDOMECHANICALS) IMPLANT
SET BASIN LINEN APH (SET/KITS/TRAYS/PACK) ×3 IMPLANT
SET TUBE SMOKE EVAC HIGH FLOW (TUBING) ×3 IMPLANT
SLEEVE ENDOPATH XCEL 5M (ENDOMECHANICALS) ×3 IMPLANT
SUT MNCRL AB 4-0 PS2 18 (SUTURE) ×6 IMPLANT
SUT VICRYL 0 UR6 27IN ABS (SUTURE) ×3 IMPLANT
SYS BAG RETRIEVAL 10MM (BASKET) ×1
SYSTEM BAG RETRIEVAL 10MM (BASKET) ×2 IMPLANT
TROCAR ENDO BLADELESS 11MM (ENDOMECHANICALS) ×3 IMPLANT
TROCAR XCEL NON-BLD 5MMX100MML (ENDOMECHANICALS) ×3 IMPLANT
TROCAR XCEL UNIV SLVE 11M 100M (ENDOMECHANICALS) ×3 IMPLANT
TUBE CONNECTING 12X1/4 (SUCTIONS) ×3 IMPLANT
WARMER LAPAROSCOPE (MISCELLANEOUS) ×3 IMPLANT

## 2022-03-16 NOTE — Anesthesia Postprocedure Evaluation (Signed)
Anesthesia Post Note ? ?Patient: Natalie Castro ? ?Procedure(s) Performed: LAPAROSCOPIC CHOLECYSTECTOMY (Abdomen) ? ?Patient location during evaluation: PACU ?Anesthesia Type: General ?Level of consciousness: awake and alert and oriented ?Pain management: pain level controlled ?Vital Signs Assessment: post-procedure vital signs reviewed and stable ?Respiratory status: spontaneous breathing, nonlabored ventilation and respiratory function stable ?Cardiovascular status: blood pressure returned to baseline and stable ?Postop Assessment: no apparent nausea or vomiting ?Anesthetic complications: no ? ? ?No notable events documented. ? ? ?Last Vitals:  ?Vitals:  ? 03/16/22 1530 03/16/22 1548  ?BP: (!) 128/59 140/60  ?Pulse: 76   ?Resp: 18 18  ?Temp: 36.6 ?C 36.5 ?C  ?SpO2: 92% 93%  ?  ?Last Pain:  ?Vitals:  ? 03/16/22 1548  ?TempSrc: Oral  ?PainSc:   ? ? ?  ?  ?  ?  ?  ?  ? ?Elizebeth Kluesner C Gratia Disla ? ? ? ? ?

## 2022-03-16 NOTE — Assessment & Plan Note (Addendum)
Restart statin when tolerating po and LFTs normalize ?

## 2022-03-16 NOTE — Progress Notes (Addendum)
? ?  The patient underwent ERCP yesterday and is scheduled for a laparoscopic cholecystectomy today. Telemetry reviewed and she is maintaining a V-paced rhythm with HR in the 70's and occasional PVC's. Eliquis and Heparin currently held pending her surgery. Would recommend resuming anticoagulation once cleared to do so from a surgical perspective.  ? ?Signed, ?Erma Heritage, PA-C ?03/16/2022, 8:01 AM ? ?Satira Sark, M.D., F.A.C.C.  ?

## 2022-03-16 NOTE — Op Note (Signed)
Operative Note ?  ?Preoperative Diagnosis: Cholecystostomy tube in place, choledocholithiasis  ?  ?Postoperative Diagnosis: Cholecystostomy tube in place, choledocholithiasis, chronic cholecystitis  ?  ?Procedure(s) Performed: Laparoscopic cholecystectomy ?  ?Surgeon: Lanell Matar. Constance Haw, MD ?  ?Assistants: No qualified resident was available ?  ?Anesthesia: General endotracheal ?  ?Anesthesiologist: Dr. Charna Elizabeth  ?  ?Specimens: Gallbladder  ?  ?Estimated Blood Loss: Minimal  ?  ?Blood Replacement: None  ?  ?Complications: None  ?  ?Operative Findings: Cholecystostomy tube through the liver to the gallbladder, chronic inflammation and scarring  ?  ?Procedure: The patient was taken to the operating room and placed supine. General endotracheal anesthesia was induced. Intravenous antibiotics were administered per protocol. An orogastric tube positioned to decompress the stomach. The abdomen was prepared and draped in the usual sterile fashion prepping in the cholecystostomy tube.  ?  ?A supraumbilical incision was made and a Veress technique was utilized to achieve pneumoperitoneum to 15 mmHg with carbon dioxide. A 11 mm optiview port was placed through the supraumbilical region, and a 10 mm 0-degree operative laparoscope was introduced. The area underlying the trocar and Veress needle were inspected and without evidence of injury.  Remaining trocars were placed under direct vision. Two 5 mm ports were placed in the right abdomen, between the anterior axillary and midclavicular line.  A final 11 mm port was placed through the mid-epigastrium, near the falciform ligament.  ?  ?The gallbladder and liver were adhesed to the abdominal wall with scar tissue what was tissue paper like and the cholecystomy tube that was coming through the liver to the gallbladder. These adhesion were taken down.  Omental adhesions with edema were taken down. The gallbladder fundus was elevated cephalad and the infundibulum was retracted to the  patient's right. There was a small vessel laterally on the infundibulum that was clipped to be sure it did not bleed or leak, it was less than 71m in size. The gallbladder/cystic duct junction was skeletonized. The cystic artery was wrapping around the cystic duct and could not be peeled off.  We then continued liberal medial and lateral dissection until the critical view of safety was achieved where the two structures that were wrapping around each other were going into the gallbladder and the hepatic bed was bed with no other structures going into the gallbladder.   ?  ?The cholecystostomy tube was removed in its entirety. The cystic duct and cystic artery were divided with a vascular Endo GIA stapler. The gallbladder was then dissected from the liver bed with electrocautery. The specimen was placed in an Endopouch and was retrieved through the epigastric site. The cholecystostomy tube tunnel was transected and the liver was cauterized.  ?  ?Final inspection revealed acceptable hemostasis. Surgical SNOW was placed in the gallbladder bed.  Trocars were removed and pneumoperitoneum was released.  0 Vicryl fascial sutures were used to close the epigastric and umbilical port sites. Skin incisions were closed with 4-0 Monocryl subcuticular sutures and Dermabond. The patient was awakened from anesthesia and extubated without complication. The cholecystostomy tube site was covered with a gauze and tegaderm.  ?  ?LCurlene Labrum MD ?[facility] ?[address] [place] [id number]?[phone](office) ?  ?

## 2022-03-16 NOTE — Progress Notes (Signed)
?  ?       ?PROGRESS NOTE ? ?Natalie Castro:[id number] DOB: 06-23-1941 DOA: 03/13/2022 ?PCP: Redmond School, MD ? ?Brief History:  ?81 y.o. female with history HFpEF, coronary disease, diabetes mellitus type 2, complete heart block status post permanent pacemaker, hypertension, hyperlipidemia, pulmonary embolus on apixaban, and and chronic cholecystitis with percutaneous drain who presented to the ED with worsening generalized abdominal pain as well as nausea and vomiting that appears to have began in the last 1 day.  She was noted to have a fever as well.  Upon chart review, it appears that she was supposed to have her percutaneous cholecystostomy drain replaced on 3/3 due to the fact that it was dysfunctional, it does not appear to have been done.  Patient is quite lethargic at this time and cannot give any further history. ?  ?In the ED, CT scan of the abdomen and pelvis was performed with no acute process mentioned.  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  Cholecystostomy drain appears to be in good position.  She is noted to have significant LFT elevation and lipase is over 9000.  She has been started on 2 L fluid bolus and has been given Rocephin and Flagyl. ? ?03/14/2022:  Pt feeling much better.  Lipase way down to 51. Tolerating clear liquid diet.  Cardiology preop eval reporting intermediate risk for surgery.  Awaiting anesthesia team to give opinion.  ? ?03/15/2022:  ERCP and sphinterotomy performed with Dr. Laural Golden.  Anticipating cholecystectomy with Dr. Constance Haw on 03/16/22.   ? ?03/16/22: lap chole done without any immediate post-op complications  ? ?Assessment/Plan: ? ? ?Principal Problem: ?  Acute gallstone pancreatitis ?Active Problems: ?  Chronic diastolic CHF (congestive heart failure) ([place]) ?  Sepsis ([place]) ?  E coli bacteremia ?  Chronic cholecystitis ?  Breast mass, right ?  Essential hypertension, benign ?  Complete heart block (HCC)/Saint Jude pacemaker ?   History of pulmonary embolus (PE) ?  Mixed hyperlipidemia ?  Transaminitis ?  Obesity (BMI 30-39.9) ?  B12 deficiency ?  Hypomagnesemia ?  Controlled type 2 diabetes mellitus without complication, without long-term current use of insulin ([place]) ?  Hypokalemia ? ?Assessment and Plan: ?* Acute gallstone pancreatitis ?3/19 CT abd--multiple hepatic cysts with slight increase in size;  Chronic indwelling cholecystostomy tube appears stable and satisfactory ?Lipase 9034 upon presentation ?3/21 ERCP--Dilated CBD and CHD with large long tubular filling defect filling duct consistent with stone and sludge. ?Biliary sphincterotomy performed and stone/sludge removed with Damia basket and stone balloon extractor. ?3/22--lap chole ? ?Chronic diastolic CHF (congestive heart failure) ([place]) ?Appears clinically euvolemic ?Holding furosemide temporarily due to hypotension and sepsis initially ?-restarted lasix when tolerating po ? ?Sepsis ([place]) ?Sepsis physiology has resolved with supportive measures.  ?Presented with fever, leukocytosis and bacteremia ? ?E coli bacteremia ?Source is hepatobiliary ?Continue ceftriaxone ? ?Chronic cholecystitis ?Cholecystotomy tube initially placed 06/2021 ?3/19 CT abd--appropriate positioning of chole tube ?Appreciate general surgery consult ? ? ?Breast mass, right ?Incidental finding on CT abd ?Present on CT chest 06/2021 ?Now larger on imaging ?Patient and family updated and made aware ?Will need mammogram after d/c ? ?Hypokalemia ?IV replacement ordered.  ?Magnesium repleted.  ?Recheck bmp in AM ? ?Controlled type 2 diabetes mellitus without complication, without long-term current use of insulin ([place]) ?10/30/2021 hemoglobin A1c 6.8 ?02/05/22 A1C--6.6 ?NovoLog sliding scale ?Holding metformin>>restart after d/c ? ? ?Hypomagnesemia ?IV replacement ordered 3/20 and repleted ?  ? ?  B12 deficiency ?Restart po B12 when tolerating po ? ?Obesity (BMI 30-39.9) ?Class 1 obesity ?BMI 30.31 ?Lifestyle  modification ? ?Transaminitis ?LFTs are normalized. Lipase is down to 51.  ? ?Mixed hyperlipidemia ?Restart statin when tolerating po and LFTs normalize ? ?History of pulmonary embolus (PE) ?She is on apixaban PTA ?IV heparin bridge while apixaban on hold ?Restart apixaban when ok with surgery ? ?Complete heart block (HCC)/Saint Jude pacemaker ?Follow up Dr. [name] in outpatient setting ?Cardiology cleared pt for surgery without any further testing ? ?Essential hypertension, benign ?Holding nifedipine, hydralazine, losartan, metoprolol succinate initially ?Reintroduce antihypertensive medications gradually ?Metoprolol restarted ? ? ? ? ? ? ? ? ?Status is: Inpatient ?Remains inpatient appropriate because: severity of illness requiring operative intervention and IV antibiotics ? ? ? ?Family Communication:   daughters updated at bedside 3/23 ? ?Consultants:  GI, gen surgery ? ?Code Status:  FULL ? ?DVT Prophylaxis:  apixaban restart when ok with surgery ? ? ?Procedures: ?As Listed in Progress Note Above ? ?Antibiotics: ?Ceftriaxone 3/19>> ?Metronidazole 3/19>>3/22 ? ? ? ? ?Subjective: ?Abd pain controlled.  Has some nausea without emesis.  Denies f/c, cp, sob, headache ? ?Objective: ?Vitals:  ? 03/16/22 1505 03/16/22 1515 03/16/22 1530 03/16/22 1548  ?BP: (!) 140/116 122/67 (!) 128/59 140/60  ?Pulse: 82 78 76   ?Resp: (!) '25 19 18 18  '$ ?Temp: 98.3 ?F (36.8 ?C)  97.9 ?F (36.6 ?C) 97.7 ?F (36.5 ?C)  ?TempSrc:    Oral  ?SpO2: 98% 92% 92% 93%  ?Weight:      ?Height:      ? ? ?Intake/Output Summary (Last 24 hours) at 03/16/2022 1745 ?Last data filed at 03/16/2022 1507 ?Gross per 24 hour  ?Intake 2060 ml  ?Output 1065 ml  ?Net 995 ml  ? ?Weight change:  ?Exam: ? ?General:  Pt is alert, follows commands appropriately, not in acute distress ?HEENT: No icterus, No thrush, No neck mass, [place]/AT ?Cardiovascular: RRR, S1/S2, no rubs, no gallops ?Respiratory: bibasilar rales. No wheeze ?Abdomen: Soft/+BS, non tender, non distended, no  guarding ?Extremities: No edema, No lymphangitis, No petechiae, No rashes, no synovitis ? ? ?Data Reviewed: ?I have personally reviewed following labs and imaging studies ?Basic Metabolic Panel: ?Recent Labs  ?Lab 03/13/22 ?0115 03/14/22 ?2595 03/15/22 ?6387 03/16/22 ?5643  ?NA 137 140 140 140  ?K 3.7 3.2* 3.4* 3.7  ?CL 98 106 109 108  ?CO2 '27 25 25 24  '$ ?GLUCOSE 225* 122* 169* 134*  ?BUN 17 9 <5* <5*  ?CREATININE 1.09* 0.73 0.71 0.79  ?CALCIUM 8.6* 7.7* 7.7* 7.5*  ?MG  --  1.5* 2.0  --   ? ?Liver Function Tests: ?Recent Labs  ?Lab 03/13/22 ?0115 03/14/22 ?0419 03/15/22 ?3295  ?AST 138* 32 16  ?ALT 55* 28 19  ?ALKPHOS 154* 83 71  ?BILITOT 1.6* 0.3 0.2*  ?PROT 7.7 5.7* 5.9*  ?ALBUMIN 2.7* 1.9* 1.9*  ? ?Recent Labs  ?Lab 03/13/22 ?0115 03/14/22 ?1884 03/15/22 ?1660 03/16/22 ?6301  ?LIPASE 9,034* 58* 32 28  ? ?No results for input(s): AMMONIA in the last 168 hours. ?Coagulation Profile: ?Recent Labs  ?Lab 03/13/22 ?0115  ?INR 1.7*  ? ?CBC: ?Recent Labs  ?Lab 03/13/22 ?0115 03/14/22 ?6010 03/15/22 ?9323 03/16/22 ?5573  ?WBC 11.3* 6.4 6.9 9.4  ?NEUTROABS 9.6*  --   --   --   ?HGB 11.1* 8.5* 9.3* 8.6*  ?HCT 36.2 27.4* 27.7* 28.5*  ?MCV 94.3 96.5 94.9 96.9  ?PLT 188 168 211 211  ? ?Cardiac Enzymes: ?No  results for input(s): CKTOTAL, CKMB, CKMBINDEX, TROPONINI in the last 168 hours. ?BNP: ?Invalid input(s): POCBNP ?CBG: ?Recent Labs  ?Lab 03/16/22 ?4034 03/16/22 ?7425 03/16/22 ?9563 03/16/22 ?1201 03/16/22 ?1709  ?GLUCAP 130* 139* 125* 128* 197*  ? ?HbA1C: ?No results for input(s): HGBA1C in the last 72 hours. ?Urine analysis: ?   ?Component Value Date/Time  ? COLORURINE AMBER (A) 03/13/2022 0530  ? APPEARANCEUR CLEAR 03/13/2022 0530  ? LABSPEC 1.040 (H) 03/13/2022 0530  ? PHURINE 5.0 03/13/2022 0530  ? GLUCOSEU NEGATIVE 03/13/2022 0530  ? HGBUR SMALL (A) 03/13/2022 0530  ? BILIRUBINUR NEGATIVE 03/13/2022 0530  ? KETONESUR 5 (A) 03/13/2022 0530  ? PROTEINUR 30 (A) 03/13/2022 0530  ? UROBILINOGEN 0.2 06/18/2015 2300  ?  NITRITE NEGATIVE 03/13/2022 0530  ? LEUKOCYTESUR NEGATIVE 03/13/2022 0530  ? ?Sepsis Labs: ?'@LABRCNTIP'$ (procalcitonin:4,lacticidven:4) ?) ?Recent Results (from the past 240 hour(s))  ?Culture, blood (Routine x 2)     [facility]

## 2022-03-16 NOTE — Assessment & Plan Note (Signed)
Restart po B12 when tolerating po ?

## 2022-03-16 NOTE — Assessment & Plan Note (Signed)
Cholecystotomy tube initially placed 06/2021 ?3/19 CT abd--appropriate positioning of chole tube ?Appreciate general surgery consult ? ?

## 2022-03-16 NOTE — Discharge Instructions (Signed)
Discharge Laparoscopic Surgery Instructions: ? ?Common Complaints: ?Right shoulder pain is common after laparoscopic surgery. This is secondary to the gas used in the surgery being trapped under the diaphragm.  ?Walk to help your body absorb the gas. This will improve in a few days. ?Pain at the port sites are common, especially the larger port sites. This will improve with time.  ?Some nausea is common and poor appetite. The main goal is to stay hydrated the first few days after surgery.  ? ?Diet/ Activity: ?Ok to remove the bandage where the cholecystostomy tube was on 03/18/2022.  ?Diet as tolerated. You may not have an appetite, but it is important to stay hydrated. Drink 64 ounces of water a day. Your appetite will return with time.  ?Shower per your regular routine daily.  Do not take hot showers. Take warm showers that are less than 10 minutes. ?Rest and listen to your body, but do not remain in bed all day.  ?Walk everyday for at least 15-20 minutes. Deep cough and move around every 1-2 hours in the first few days after surgery.  ?Do not lift > 10 lbs, perform excessive bending, pushing, pulling, squatting for 1-2 weeks after surgery.  ?Do not pick at the dermabond glue on your incision sites.  This glue film will remain in place for 1-2 weeks and will start to peel off.  ?Do not place lotions or balms on your incision unless instructed to specifically by Dr. Constance Haw.  ? ?Pain Expectations and Narcotics: ?-After surgery you will have pain associated with your incisions and this is normal. The pain is muscular and nerve pain, and will get better with time. ?-You are encouraged and expected to take non narcotic medications like tylenol and ibuprofen (when able) to treat pain as multiple modalities can aid with pain treatment. ?-Narcotics are only used when pain is severe or there is breakthrough pain. ?-You are not expected to have a pain score of 0 after surgery, as we cannot prevent pain. A pain score of 3-4  that allows you to be functional, move, walk, and tolerate some activity is the goal. The pain will continue to improve over the days after surgery and is dependent on your surgery. ?-Due to [place] law, we are only able to give a certain amount of pain medication to treat post operative pain, and we only give additional narcotics on a patient by patient basis.  ?-For most laparoscopic surgery, studies have shown that the majority of patients only need 10-15 narcotic pills, and for open surgeries most patients only need 15-20.   ?-Having appropriate expectations of pain and knowledge of pain management with non narcotics is important as we do not want anyone to become addicted to narcotic pain medication.  ?-Using ice packs in the first 48 hours and heating pads after 48 hours, wearing an abdominal binder (when recommended), and using over the counter medications are all ways to help with pain management.   ?-Simple acts like meditation and mindfulness practices after surgery can also help with pain control and research has proven the benefit of these practices. ? ?Medication: ?Take tylenol and ibuprofen as needed for pain control, alternating every 4-6 hours.  ?Example:  ?Tylenol '1000mg'$  @ 6am, 12noon, 6pm, 57mdnight (Do not exceed '4000mg'$  of tylenol a day). Ibuprofen '800mg'$  @ 9am, 3pm, 9pm, 3am (Do not exceed '3600mg'$  of ibuprofen a day).  ?Take Roxicodone for breakthrough pain every 4 hours.  ?Take Colace for constipation related to narcotic pain medication.  If you do not have a bowel movement in 2 days, take Miralax over the counter.  ?Drink plenty of water to also prevent constipation.  ? ?Contact Information: ?If you have questions or concerns, please call our office, [phone], Monday- Thursday 8AM-5PM and Friday 8AM-12Noon.  ?If it is after hours or on the weekend, please call [facility] Main Number, [phone], [phone], and ask to speak to the surgeon on call for Dr. Constance Haw at [facility].   ?

## 2022-03-16 NOTE — Transfer of Care (Signed)
Immediate Anesthesia Transfer of Care Note ? ?Patient: Natalie Castro ? ?Procedure(s) Performed: LAPAROSCOPIC CHOLECYSTECTOMY (Abdomen) ? ?Patient Location: PACU ? ?Anesthesia Type:General ? ?Level of Consciousness: awake, alert , oriented and patient cooperative ? ?Airway & Oxygen Therapy: Patient Spontanous Breathing and Patient connected to nasal cannula oxygen ? ?Post-op Assessment: Report given to RN, Post -op Vital signs reviewed and stable and Patient moving all extremities ? ?Post vital signs: Reviewed and stable ? ?Last Vitals:  ?Vitals Value Taken Time  ?BP 140/116 03/16/22 1504  ?Temp    ?Pulse 79 03/16/22 1509  ?Resp 21 03/16/22 1509  ?SpO2 95 % 03/16/22 1509  ?Vitals shown include unvalidated device data. ? ?Last Pain:  ?Vitals:  ? 03/16/22 1234  ?TempSrc: Oral  ?PainSc: 0-No pain  ?   ? ?Patients Stated Pain Goal: 4 (03/16/22 1234) ? ?Complications: No notable events documented. ?

## 2022-03-16 NOTE — Assessment & Plan Note (Signed)
Follow up Dr. [name] in outpatient setting ?Cardiology cleared pt for surgery without any further testing ?

## 2022-03-16 NOTE — Interval H&P Note (Signed)
History and Physical Interval Note: ? ?03/16/2022 ?1:22 PM ? ?Natalie Castro  has presented today for surgery, with the diagnosis of cholecystostomy tube in place, choledocolithiasis.  The various methods of treatment have been discussed with the patient and family. After consideration of risks, benefits and other options for treatment, the patient has consented to  Procedure(s): ?LAPAROSCOPIC CHOLECYSTECTOMY (N/A) as a surgical intervention.  The patient's history has been reviewed, patient examined, no change in status, stable for surgery.  I have reviewed the patient's chart and labs.  Questions were answered to the patient's satisfaction.   ? ?Did well with ercp and will proceed with cholecystitis  ?Virl Cagey ? ? ?

## 2022-03-16 NOTE — Assessment & Plan Note (Signed)
Appears clinically euvolemic ?Holding furosemide temporarily due to hypotension and sepsis initially ?-restarted lasix when tolerating po ?

## 2022-03-16 NOTE — Progress Notes (Signed)
[facility] ? ?Surgery went well. Updated family. Diet as tolerated. PRN meds. Dr. Laural Golden wanted anticoagulation held for 72 hours (3/24). It should be fine to restart then. ? ?Curlene Labrum, MD ?[facility] ?[address], [place] [address] ?[phone] (office) ? ?

## 2022-03-16 NOTE — Assessment & Plan Note (Signed)
Class 1 obesity ?BMI 30.31 ?Lifestyle modification ?

## 2022-03-16 NOTE — Anesthesia Preprocedure Evaluation (Addendum)
Anesthesia Evaluation  ?Patient identified by MRN, date of birth, ID band ?Patient awake ? ? ? ?Reviewed: ?Allergy & Precautions, NPO status , Patient's Chart, lab work & pertinent test results, reviewed documented beta blocker date and time  ? ?History of Anesthesia Complications ?Negative for: history of anesthetic complications ? ?Airway ?Mallampati: II ? ?TM Distance: >3 FB ?Neck ROM: Full ? ? ? Dental ? ?(+) Dental Advisory Given, Edentulous Upper, Missing ?  ?Pulmonary ?PE ?  ?Pulmonary exam normal ?breath sounds clear to auscultation ? ? ? ? ? ? Cardiovascular ?METS: 3 - Mets hypertension, Pt. on medications and Pt. on home beta blockers ?+CHF  ?Normal cardiovascular exam+ dysrhythmias (complete heart block) + pacemaker  ?Rhythm:Regular Rate:Normal ? ?The left ventricular systolic function is normal. ?LV end diastolic pressure is mildly elevated. LVEDP 17 mm Hg. ?The left ventricular ejection fraction is 55-65% by visual estimate. ?There is no aortic valve stenosis. ?Mild nonobstructive coroanry artery disease. ?Tortuous vessels. Catheter induced spasm of the ostial RCA. ?  ?Mild radial vasospasm.  Continue preventive therapy.  Low level troponin elevation may be related to her rhythm disturbance noted on ECG ? ?  ?Neuro/Psych ?negative neurological ROS ? negative psych ROS  ? GI/Hepatic ?Neg liver ROS, GERD  ,  ?Endo/Other  ?negative endocrine ROSdiabetes, Well Controlled, Type 2, Oral Hypoglycemic Agents, Insulin Dependent ? Renal/GU ?Renal InsufficiencyRenal disease  ?negative genitourinary ?  ?Musculoskeletal ? ?(+) Arthritis , Osteoarthritis,   ? Abdominal ?  ?Peds ?negative pediatric ROS ?(+)  Hematology ? ?(+) Blood dyscrasia, anemia ,   ?Anesthesia Other Findings ? ? Reproductive/Obstetrics ?negative OB ROS ? ?  ? ? ? ? ? ? ? ? ? ? ? ? ? ?  ?  ? ? ? ? ? ? ? ? ?Anesthesia Physical ? ?Anesthesia Plan ? ?ASA: 3 ? ?Anesthesia Plan: General  ? ?Post-op Pain Management:  Minimal or no pain anticipated  ? ?Induction: Intravenous ? ?PONV Risk Score and Plan: 4 or greater and Ondansetron and Dexamethasone ? ?Airway Management Planned: Oral ETT ? ?Additional Equipment:  ? ?Intra-op Plan:  ? ?Post-operative Plan: Extubation in OR ? ?Informed Consent: I have reviewed the patients History and Physical, chart, labs and discussed the procedure including the risks, benefits and alternatives for the proposed anesthesia with the patient or authorized representative who has indicated his/her understanding and acceptance.  ? ? ? ?Dental advisory given ? ?Plan Discussed with: CRNA and Surgeon ? ?Anesthesia Plan Comments:   ? ? ? ? ? ?Anesthesia Quick Evaluation ? ?

## 2022-03-16 NOTE — Assessment & Plan Note (Addendum)
Source is hepatobiliary ?Continue ceftriaxone ?3/23--de-escalated to cefazolin ?Plan to d/c home with 5 more days po abx to complete 10 days ?

## 2022-03-16 NOTE — Assessment & Plan Note (Addendum)
Holding nifedipine, hydralazine, losartan, metoprolol succinate initially ?Reintroduce antihypertensive medications gradually ?Metoprolol restarted ?

## 2022-03-16 NOTE — Progress Notes (Signed)
?Subjective: ? ?Patient has no complaints other than slight irritation in her throat which is better than it was last night.  She denies abdominal pain chest pain or shortness of breath. ? ?Current Medications: ? ?Current Facility-Administered Medications:  ?  acetaminophen (TYLENOL) tablet 650 mg, 650 mg, Oral, Q6H PRN **OR** acetaminophen (TYLENOL) suppository 650 mg, 650 mg, Rectal, Q6H PRN, Aviel Davalos U, MD ?  cefTRIAXone (ROCEPHIN) 2 g in sodium chloride 0.9 % 100 mL IVPB, 2 g, Intravenous, Q24H, Last Rate: 200 mL/hr at 03/16/22 0509, 2 g at 03/16/22 0509 **AND** metroNIDAZOLE (FLAGYL) IVPB 500 mg, 500 mg, Intravenous, Q12H, Damiean Lukes U, MD, Last Rate: 100 mL/hr at 03/15/22 1929, 500 mg at 03/15/22 1929 ?  [COMPLETED] 6 CHG cloth bath night before surgery, , , Once **AND** [START ON 03/17/2022] 6 CHG cloth bath AM of surgery, , , Once **AND** [DISCONTINUED] Chlorhexidine Gluconate Cloth 2 % PADS 6 each, 6 each, Topical, Once **AND** Chlorhexidine Gluconate Cloth 2 % PADS 6 each, 6 each, Topical, Once, Virl Cagey, MD ?  ciprofloxacin (CIPRO) IVPB 400 mg, 400 mg, Intravenous, On Call to OR, Virl Cagey, MD ?  feeding supplement (BOOST / RESOURCE BREEZE) liquid 1 Container, 1 Container, Oral, TID BM, Rogene Houston, MD, 1 Container at 03/14/22 2103 ?  HYDROmorphone (DILAUDID) injection 0.5-1 mg, 0.5-1 mg, Intravenous, Q4H PRN, Johnika Escareno U, MD ?  insulin aspart (novoLOG) injection 0-9 Units, 0-9 Units, Subcutaneous, TID WC, Johnson, Clanford L, MD, 2 Units at 03/15/22 1749 ?  lactated ringers infusion, , Intravenous, Continuous, Bev Drennen U, MD, Last Rate: 65 mL/hr at 03/15/22 0615, Restarted at 03/15/22 1335 ?  metoprolol succinate (TOPROL-XL) 24 hr tablet 50 mg, 50 mg, Oral, Daily, Shaquetta Arcos U, MD, 50 mg at 03/15/22 8341 ?  multivitamin with minerals tablet 1 tablet, 1 tablet, Oral, Daily, Shadrach Bartunek, Mechele Dawley, MD, 1 tablet at 03/15/22 9622 ?  ondansetron (ZOFRAN) injection 4  mg, 4 mg, Intravenous, Q6H PRN, Meshell Abdulaziz U, MD ?  ondansetron (ZOFRAN) tablet 4 mg, 4 mg, Oral, Q6H PRN **OR** ondansetron (ZOFRAN) injection 4 mg, 4 mg, Intravenous, Q6H PRN, Tavis Kring U, MD ?  pantoprazole (PROTONIX) injection 40 mg, 40 mg, Intravenous, Q24H, Joushua Dugar U, MD, 40 mg at 03/15/22 1149  ? ?Objective: ?Blood pressure (!) 151/66, pulse 74, temperature 98.6 ?F (37 ?C), temperature source Oral, resp. rate 17, height _0  (1.626 m), weight 80.1 kg, SpO2 94 %. ?Patient is alert and in no acute distress. ?When I walked in the room she was on the phone talking with one of her family members. ?Abdominal exam remains within normal limits. ? ?Labs/studies Results: ? ? ? ?  Latest Ref Rng & Units 03/16/2022  ?  4:37 AM 03/15/2022  ?  6:28 AM 03/14/2022  ?  4:19 AM  ?CBC  ?WBC 4.0 - 10.5 K/uL 9.4   6.9   6.4    ?Hemoglobin 12.0 - 15.0 g/dL 8.6   9.3   8.5    ?Hematocrit 36.0 - 46.0 % 28.5   27.7   27.4    ?Platelets 150 - 400 K/uL 211   211   168    ?  ? ?  Latest Ref Rng & Units 03/16/2022  ?  4:37 AM 03/15/2022  ?  6:28 AM 03/14/2022  ?  4:19 AM  ?CMP  ?Glucose 70 - 99 mg/dL 134   169   122    ?BUN 8 - 23  mg/dL <5   <5   9    ?Creatinine 0.44 - 1.00 mg/dL 0.79   0.71   0.73    ?Sodium 135 - 145 mmol/L 140   140   140    ?Potassium 3.5 - 5.1 mmol/L 3.7   3.4   3.2    ?Chloride 98 - 111 mmol/L 108   109   106    ?CO2 22 - 32 mmol/L _0 ?Calcium 8.9 - 10.3 mg/dL 7.5   7.7   7.7    ?Total Protein 6.5 - 8.1 g/dL  5.9   5.7    ?Total Bilirubin 0.3 - 1.2 mg/dL  0.2   0.3    ?Alkaline Phos 38 - 126 U/L  71   83    ?AST 15 - 41 U/L  16   32    ?ALT 0 - 44 U/L  19   28    ?  ? ?  Latest Ref Rng & Units 03/15/2022  ?  6:28 AM 03/14/2022  ?  4:19 AM 03/13/2022  ?  1:15 AM  ?Hepatic Function  ?Total Protein 6.5 - 8.1 g/dL 5.9   5.7   7.7    ?Albumin 3.5 - 5.0 g/dL 1.9   1.9   2.7    ?AST 15 - 41 U/L 16   32   138    ?ALT 0 - 44 U/L 19   28   55    ?Alk Phosphatase 38 - 126 U/L 71   83   154    ?Total  Bilirubin 0.3 - 1.2 mg/dL 0.2   0.3   1.6    ?  ?Lipase 28. ? ?Assessment: ? ?#1.  Biliary pancreatitis.  Patient underwent therapeutic ERCP yesterday.  She she had large soft stone/debris removed from her bile duct after biliary sphincterotomy was performed.  Patient is doing well. ? ?#2.  Anemia.  Hemoglobin has dropped by less than 1 g.  No evidence of overt GI bleed.  We will continue to monitor H&H. ? ?#3.  History of cholecystitis in July 2022 treated with cholecystostomy.  At that time she was deemed to be high risk.  Her risk is now acceptable.  She has been evaluated by cardiology.  For cholecystectomy today. ? ? ? ? ? ? ? ? ? ?

## 2022-03-16 NOTE — Assessment & Plan Note (Signed)
Incidental finding on CT abd ?Present on CT chest 06/2021 ?Now larger on imaging ?Patient and family updated and made aware ?Will need mammogram after d/c ?

## 2022-03-17 ENCOUNTER — Encounter (HOSPITAL_COMMUNITY): Payer: Self-pay | Admitting: General Surgery

## 2022-03-17 DIAGNOSIS — E538 Deficiency of other specified B group vitamins: Secondary | ICD-10-CM

## 2022-03-17 DIAGNOSIS — N6310 Unspecified lump in the right breast, unspecified quadrant: Secondary | ICD-10-CM

## 2022-03-17 LAB — COMPREHENSIVE METABOLIC PANEL
ALT: 19 U/L (ref 0–44)
AST: 33 U/L (ref 15–41)
Albumin: 2 g/dL — ABNORMAL LOW (ref 3.5–5.0)
Alkaline Phosphatase: 65 U/L (ref 38–126)
Anion gap: 10 (ref 5–15)
BUN: 8 mg/dL (ref 8–23)
CO2: 22 mmol/L (ref 22–32)
Calcium: 8.1 mg/dL — ABNORMAL LOW (ref 8.9–10.3)
Chloride: 106 mmol/L (ref 98–111)
Creatinine, Ser: 0.83 mg/dL (ref 0.44–1.00)
GFR, Estimated: >60 mL/min (ref >60)
Glucose, Bld: 226 mg/dL — ABNORMAL HIGH (ref 70–99)
Potassium: 4.1 mmol/L (ref 3.5–5.1)
Sodium: 138 mmol/L (ref 135–145)
Total Bilirubin: 0.5 mg/dL (ref 0.3–1.2)
Total Protein: 6.2 g/dL — ABNORMAL LOW (ref 6.5–8.1)

## 2022-03-17 LAB — GLUCOSE, CAPILLARY
Glucose-Capillary: 201 mg/dL — ABNORMAL HIGH (ref 70–99)
Glucose-Capillary: 257 mg/dL — ABNORMAL HIGH (ref 70–99)
Glucose-Capillary: 196 mg/dL — ABNORMAL HIGH (ref 70–99)
Glucose-Capillary: 220 mg/dL — ABNORMAL HIGH (ref 70–99)

## 2022-03-17 LAB — CBC
HCT: 30.4 % — ABNORMAL LOW (ref 36.0–46.0)
Hemoglobin: 9.2 g/dL — ABNORMAL LOW (ref 12.0–15.0)
MCH: 29 pg (ref 26.0–34.0)
MCHC: 30.3 g/dL (ref 30.0–36.0)
MCV: 95.9 fL (ref 80.0–100.0)
Platelets: 230 10*3/uL (ref 150–400)
RBC: 3.17 MIL/uL — ABNORMAL LOW (ref 3.87–5.11)
RDW: 13.6 % (ref 11.5–15.5)
WBC: 9.6 10*3/uL (ref 4.0–10.5)
nRBC: 0 % (ref 0.0–0.2)

## 2022-03-17 MED ORDER — VITAMIN B-12 100 MCG PO TABS
500.0000 ug | ORAL_TABLET | Freq: Every day | ORAL | Status: DC
  Administered 2022-03-17 – 2022-03-18 (×2): 500 ug via ORAL
  Filled 2022-03-17: qty 5

## 2022-03-17 MED ORDER — CEFAZOLIN SODIUM-DEXTROSE 2-4 GM/100ML-% IV SOLN
2.0000 g | Freq: Three times a day (TID) | INTRAVENOUS | Status: DC
  Administered 2022-03-17 – 2022-03-18 (×4): 2 g via INTRAVENOUS
  Filled 2022-03-17 (×4): qty 100

## 2022-03-17 NOTE — Progress Notes (Signed)
? ? ?  Patient underwent laparoscopic cholecystectomy yesterday with no immediate complications noted. Telemetry reviewed and she is maintaining a V-paced rhythm. Notes reviewed with plans to restart anticoagulation on 03/18/2022. No additional Cardiology recommendations at this time. Please reach out via Amion if we can be of further assistance this admission.  ? ?Signed, ?Erma Heritage, PA-C ?03/17/2022, 8:06 AM ? ? ?

## 2022-03-17 NOTE — Progress Notes (Signed)
?  ?       ?PROGRESS NOTE ? ?Natalie Castro YJE:[id number] DOB: 1941-07-05 DOA: 03/13/2022 ?PCP: Redmond School, MD ? ?Brief History:  ?81 y.o. female with history HFpEF, coronary disease, diabetes mellitus type 2, complete heart block status post permanent pacemaker, hypertension, hyperlipidemia, pulmonary embolus on apixaban, and and chronic cholecystitis with percutaneous drain who presented to the ED with worsening generalized abdominal pain as well as nausea and vomiting that appears to have began in the last 1 day.  She was noted to have a fever as well.  Upon chart review, it appears that she was supposed to have her percutaneous cholecystostomy drain replaced on 3/3 due to the fact that it was dysfunctional, it does not appear to have been done.  Patient is quite lethargic at this time and cannot give any further history. ?  ?In the ED, CT scan of the abdomen and pelvis was performed with no acute process mentioned.  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  Cholecystostomy drain appears to be in good position.  She is noted to have significant LFT elevation and lipase is over 9000.  She has been started on 2 L fluid bolus and has been given Rocephin and Flagyl. ? ?03/14/2022:  Pt feeling much better.  Lipase way down to 51. Tolerating clear liquid diet.  Cardiology preop eval reporting intermediate risk for surgery.  Awaiting anesthesia team to give opinion.  ? ?03/15/2022:  ERCP and sphinterotomy performed with Dr. Laural Golden.  Anticipating cholecystectomy with Dr. Constance Haw on 03/16/22.   ? ?03/16/22: lap chole done without any immediate post-op complications ? ?03/17/22: changed ceftriaxone to cefazolin.  Pain in abd controlled.  Advanced diet to soft diet.  Plan for d/c home 3/24 if tolerates diet and hospital bed delivered.  ? ? ?Assessment and Plan: ?* Acute gallstone pancreatitis ?3/19 CT abd--multiple hepatic cysts with slight increase in size;  Chronic indwelling  cholecystostomy tube appears stable and satisfactory ?Lipase 9034 upon presentation ?3/21 ERCP--Dilated CBD and CHD with large long tubular filling defect filling duct consistent with stone and sludge. ?Biliary sphincterotomy performed and stone/sludge removed with Damia basket and stone balloon extractor. ?3/22--lap chole ?03/17/22--advance diet to soft diet ? ?Sepsis ([place]) ?Sepsis physiology has resolved with supportive measures.  ?Presented with fever, leukocytosis and bacteremia ? ?E coli bacteremia ?Source is hepatobiliary ?Continue ceftriaxone ?3/23--de-escalated to cefazolin ?Plan to d/c home with 5 more days po abx to complete 10 days ? ?Chronic cholecystitis ?Cholecystotomy tube initially placed 06/2021 ?3/19 CT abd--appropriate positioning of chole tube ?Appreciate general surgery consult ? ? ?Breast mass, right ?Incidental finding on CT abd ?Present on CT chest 06/2021 ?Now larger on imaging ?Patient and family updated and made aware ?Will need mammogram after d/c ? ?Chronic diastolic CHF (congestive heart failure) ([place]) ?Appears clinically euvolemic ?Holding furosemide temporarily due to hypotension and sepsis initially ?-restarted lasix when tolerating po ? ?Hypokalemia ?IV replacement ordered.  ?Magnesium repleted.  ?Recheck bmp in AM ? ?Controlled type 2 diabetes mellitus without complication, without long-term current use of insulin ([place]) ?10/30/2021 hemoglobin A1c 6.8 ?02/05/22 A1C--6.6 ?NovoLog sliding scale ?Holding metformin>>restart after d/c ? ? ?Hypomagnesemia ?IV replacement ordered 3/20 and repleted ?  ? ?B12 deficiency ?Restart po B12 when tolerating po ? ?Obesity (BMI 30-39.9) ?Class 1 obesity ?BMI 30.31 ?Lifestyle modification ? ?Transaminitis ?LFTs are normalized. Lipase is down to 51.  ? ?Mixed hyperlipidemia ?Restart statin when tolerating po and LFTs normalize ? ?History  of pulmonary embolus (PE) ?She is on apixaban PTA ?IV heparin bridge while apixaban on hold ?Restart apixaban when ok  with surgery ? ?Complete heart block (HCC)/Saint Jude pacemaker ?Follow up Dr. [name] in outpatient setting ?Cardiology cleared pt for surgery without any further testing ? ?Essential hypertension, benign ?Holding nifedipine, hydralazine, losartan, metoprolol succinate initially ?Reintroduce antihypertensive medications gradually ?Metoprolol restarted ? ? ?Status is: Inpatient ?Remains inpatient appropriate because: severity of illness requiring operative intervention and IV antibiotics ?  ?  ?  ?Family Communication:   daughters updated at bedside 3/23 ?  ?Consultants:  GI, gen surgery ?  ?Code Status:  FULL ?  ?DVT Prophylaxis:  apixaban restart when ok with surgery ?  ?  ?Procedures: ?As Listed in Progress Note Above ?  ?Antibiotics: ?Ceftriaxone 3/19>>3/23 ?Metronidazole 3/19>>3/22 ?Cefazolin 3/23>> ?  ?  ? ? ? ? ?Subjective: ?Patient has some incisional tenderness.  Having BMs.  Tolerating full liquids.  Denies cp, sob, f/c, n/v ? ?Objective: ?Vitals:  ? 03/16/22 2021 03/17/22 0100 03/17/22 0515 03/17/22 1336  ?BP: (!) 154/72 140/60 (!) 158/70 (!) 134/58  ?Pulse: 87 77 78 72  ?Resp: '18  18 18  '$ ?Temp: 98.1 ?F (36.7 ?C) 98.1 ?F (36.7 ?C)  98 ?F (36.7 ?C)  ?TempSrc: Oral Oral  Oral  ?SpO2: 96% 95% 94% 99%  ?Weight:      ?Height:      ? ? ?Intake/Output Summary (Last 24 hours) at 03/17/2022 1653 ?Last data filed at 03/17/2022 1500 ?Gross per 24 hour  ?Intake 1819.21 ml  ?Output 1200 ml  ?Net 619.21 ml  ? ?Weight change:  ?Exam: ? ?General:  Pt is alert, follows commands appropriately, not in acute distress ?HEENT: No icterus, No thrush, No neck mass, [place]/AT ?Cardiovascular: RRR, S1/S2, no rubs, no gallops ?Respiratory: CTA bilaterally, no wheezing, no crackles, no rhonchi ?Abdomen: Soft/+BS, non tender, non distended, no guarding ?Extremities: trace LEedema, No lymphangitis, No petechiae, No rashes, no synovitis ? ? ?Data Reviewed: ?I have personally reviewed following labs and imaging studies ?Basic Metabolic  Panel: ?Recent Labs  ?Lab 03/13/22 ?0115 03/14/22 ?1191 03/15/22 ?4782 03/16/22 ?9562 03/17/22 ?1308  ?NA 137 140 140 140 138  ?K 3.7 3.2* 3.4* 3.7 4.1  ?CL 98 106 109 108 106  ?CO2 '27 25 25 24 22  '$ ?GLUCOSE 225* 122* 169* 134* 226*  ?BUN 17 9 <5* <5* 8  ?CREATININE 1.09* 0.73 0.71 0.79 0.83  ?CALCIUM 8.6* 7.7* 7.7* 7.5* 8.1*  ?MG  --  1.5* 2.0  --   --   ? ?Liver Function Tests: ?Recent Labs  ?Lab 03/13/22 ?0115 03/14/22 ?6578 03/15/22 ?4696 03/17/22 ?0429  ?AST 138* 32 16 33  ?ALT 55* '28 19 19  '$ ?ALKPHOS 154* 83 71 65  ?BILITOT 1.6* 0.3 0.2* 0.5  ?PROT 7.7 5.7* 5.9* 6.2*  ?ALBUMIN 2.7* 1.9* 1.9* 2.0*  ? ?Recent Labs  ?Lab 03/13/22 ?0115 03/14/22 ?2952 03/15/22 ?8413 03/16/22 ?2440  ?LIPASE 9,034* 58* 32 28  ? ?No results for input(s): AMMONIA in the last 168 hours. ?Coagulation Profile: ?Recent Labs  ?Lab 03/13/22 ?0115  ?INR 1.7*  ? ?CBC: ?Recent Labs  ?Lab 03/13/22 ?0115 03/14/22 ?1027 03/15/22 ?2536 03/16/22 ?6440 03/17/22 ?3474  ?WBC 11.3* 6.4 6.9 9.4 9.6  ?NEUTROABS 9.6*  --   --   --   --   ?HGB 11.1* 8.5* 9.3* 8.6* 9.2*  ?HCT 36.2 27.4* 27.7* 28.5* 30.4*  ?MCV 94.3 96.5 94.9 96.9 95.9  ?PLT 188 168 211 211 230  ? ?  Cardiac Enzymes: ?No results for input(s): CKTOTAL, CKMB, CKMBINDEX, TROPONINI in the last 168 hours. ?BNP: ?Invalid input(s): POCBNP ?CBG: ?Recent Labs  ?Lab 03/16/22 ?1709 03/16/22 ?2022 03/17/22 ?0709 03/17/22 ?1059 03/17/22 ?1614  ?GLUCAP 197* 231* 201* 257* 196*  ? ?HbA1C: ?No results for input(s): HGBA1C in the last 72 hours. ?Urine analysis: ?   ?Component Value Date/Time  ? COLORURINE AMBER (A) 03/13/2022 0530  ? APPEARANCEUR CLEAR 03/13/2022 0530  ? LABSPEC 1.040 (H) 03/13/2022 0530  ? PHURINE 5.0 03/13/2022 0530  ? GLUCOSEU NEGATIVE 03/13/2022 0530  ? HGBUR SMALL (A) 03/13/2022 0530  ? BILIRUBINUR NEGATIVE 03/13/2022 0530  ? KETONESUR 5 (A) 03/13/2022 0530  ? PROTEINUR 30 (A) 03/13/2022 0530  ? UROBILINOGEN 0.2 06/18/2015 2300  ? NITRITE NEGATIVE 03/13/2022 0530  ? LEUKOCYTESUR NEGATIVE  03/13/2022 0530  ? ?Sepsis Labs: ?'@LABRCNTIP'$ (procalcitonin:4,lacticidven:4) ?) ?Recent Results (from the past 240 hour(s))  ?Culture, blood (Routine x 2)     Status: Abnormal  ? Collection Time: 03/13/22  1:15 AM  ? Spe

## 2022-03-17 NOTE — Progress Notes (Signed)
Mrs. Natalie Castro is an 81 year old female who had acute cholecystitis in July 2022 who was deemed high risk for surgery at the time and had cholecystostomy tube placed in IR in August 2022. She had multiple tube exchanges and presented to the ED 03/13/22 with complaints of acute upper abdominal pain and fever, nausea, and vomiting.  ? ?She had evidence of a dilated CBD with filing defect on CT. She underwent ERCP on 03/15/22 with stone and sludge retrieval, no stent placed at this time. She has had a subsequent lap cholecystectomy performed with surgery yesterday 03/16/22. She is doing well this morning with no complaints of nausea, vomitting, or abdominal pain. Tolerating full liquids.  Continue to hold anticoagulation until after tomorrow. ? ?GI will sign off at this time. Please reach out if any issues arise. Thank you for the consultation.  ? ?Venetia Night, MSN, FNP-BC, AGACNP-BC ?[facility] ? ?

## 2022-03-17 NOTE — TOC Initial Note (Addendum)
Transition of Care (TOC) - Initial/Assessment Note  ? ? ?Patient Details  ?Name: Natalie Castro ?MRN: [id number] ?Date of Birth: 1941-04-10 ? ?Transition of Care (TOC) CM/SW Contact:    ?Salome Arnt, LCSW ?Phone Number: ?03/17/2022, 11:06 AM ? ?Clinical Narrative:  Pt s/p laparoscopic cholecystectomy. Assessment completed due to high risk readmission score. Pt reports she lives with her daughter, [name]. [name] works from [employer] and pt is alone during those hours. Pt asked if she could get home care during the day. LCSW explained that this would be private pay and not covered by insurance. Pt states she plans to go stay with her other daughter, [name] ([id number] [place] [phone]) where she can have around the clock assistance if needed. Pt is active with [facility] PT/OT. Will need resumption orders. [name] updated that pt will be going to daughter's house in [place]. Pt indicates [name] has been to this address before when she stayed with [name].                ? ?Addendum: Pt's daughter, [name] called and requested hospital bed. No preference on agency. Referred to [facility] with [facility].  ? ?Expected Discharge Plan: [place] ?Barriers to Discharge: Continued Medical Work up ? ? ?Patient Goals and CMS Choice ?Patient states their goals for this hospitalization and ongoing recovery are:: return home ?  ?Choice offered to / list presented to : Patient ? ?Expected Discharge Plan and Services ?Expected Discharge Plan: [place] ?In-house Referral: Clinical Social Work ?  ?Post Acute Care Choice: Resumption of Svcs/PTA Provider ?Living arrangements for the past 2 months: [place] ?                ?  ?  ?  ?  ?  ?HH Arranged: PT, OT ?[place] Agency: [place] ?Date HH Agency Contacted: 03/17/22 ?Time [place]: 2355 ?Representative spoke with at [place]: Tommi Rumps ? ?Prior Living Arrangements/Services ?Living arrangements for the past 2 months:  [place] ?Lives with:: Adult Children ?Patient language and need for interpreter reviewed:: Yes ?Do you feel safe going back to the place where you live?: Yes      ?Need for Family Participation in Patient Care: No (Comment) ?Care giver support system in place?: Yes (comment) ?Current home services: DME, Home OT, Home PT (walker, 3N1, wheelchair) ?Criminal Activity/Legal Involvement Pertinent to Current Situation/Hospitalization: No - Comment as needed ? ?Activities of Daily Living ?Home Assistive Devices/Equipment: Gilford Rile (specify type) ?ADL Screening (condition at time of admission) ?Patient's cognitive ability adequate to safely complete daily activities?: Yes ?Is the patient deaf or have difficulty hearing?: Yes ?Does the patient have difficulty seeing, even when wearing glasses/contacts?: Yes ?Does the patient have difficulty concentrating, remembering, or making decisions?: No ?Patient able to express need for assistance with ADLs?: Yes ?Does the patient have difficulty dressing or bathing?: No ?Independently performs ADLs?: Yes (appropriate for developmental age) ?Does the patient have difficulty walking or climbing stairs?: No ?Weakness of Legs: Both ?Weakness of Arms/Hands: Both ? ?Permission Sought/Granted ?  ?  ?   ? Permission granted to share info w AGENCY: [name] ? Permission granted to share info w Relationship: Home health ?   ? ?Emotional Assessment ?  ?Attitude/Demeanor/Rapport: Engaged ?Affect (typically observed): Pleasant ?Orientation: : Oriented to Self, Oriented to Place, Oriented to  Time, Oriented to Situation ?Alcohol / Substance Use: Not Applicable ?Psych Involvement: No (comment) ? ?Admission diagnosis:  Dehydration [E86.0] ?Acute febrile illness [R50.9] ?Sepsis ([place]) [A41.9] ?Acute pancreatitis, unspecified complication status, unspecified pancreatitis type [K85.90] ?Patient Active Problem List  ? Diagnosis Date Noted  ? Breast mass, right 03/16/2022  ? Hypokalemia 03/14/2022   ? Acute gallstone pancreatitis   ? Sepsis ([place]) 03/13/2022  ? Gallstone pancreatitis   ? Hyperlipidemia associated with type 2 diabetes mellitus ([place]) 02/16/2022  ? Aortic atherosclerosis ([place]) 02/16/2022  ? Controlled type 2 diabetes mellitus without complication, without long-term current use of insulin ([place]) 02/14/2022  ? Enteritis due to Norovirus 02/06/2022  ? 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 Hypomagnesemia 02/05/2022  ? Chronic cholecystitis   ? Severe sepsis ([place]) 02/04/2022  ? Lactic acidosis 02/04/2022  ? Transaminitis 02/04/2022  ? Obesity (BMI 30-39.9) 02/04/2022  ? B12 deficiency 02/04/2022  ? Acute cholecystitis without calculus 10/30/2021  ? Chronic diastolic CHF (congestive heart failure) ([place]) 08/21/2021  ? E coli bacteremia 07/05/2021  ? Mixed hyperlipidemia 11/04/2019  ? History of pulmonary embolus (PE) 11/04/2016  ? Complete heart block (HCC)/Saint Jude pacemaker 11/23/2015  ? Essential hypertension, benign 07/20/2009  ? ?PCP:  Redmond School, MD ?Pharmacy:   ?[id number] - [place], [address] ?[place] ?[place] [address] ?Phone: [phone] Fax: [phone] ? ? ? ? ?Social Determinants of Health (SDOH) Interventions ?  ? ?Readmission Risk Interventions ? ?  03/17/2022  ? 11:01 AM 07/11/2021  ?  1:23 PM  ?Readmission Risk Prevention Plan  ?Transportation Screening Complete Complete  ?PCP or Specialist Appt within 5-7 Days  Complete  ?Home Care Screening  Complete  ?Medication Review (RN CM)  Complete  ?[place] or Home Care Consult Complete   ?Social Work Consult for [place] Planning/Counseling Complete   ?Palliative Care Screening Not Applicable   ?Medication Review Press photographer) Complete   ? ? ? ?

## 2022-03-17 NOTE — Progress Notes (Signed)
Patient requires frequent re-positioning of the body in ways that cannot be achieved with an ordinary bed or wedge pillow, to eliminate pain, reduce pressure, and the head of the bed to be elevated more than 30 degrees most of the time due to congestive heart failure.  ?

## 2022-03-17 NOTE — Progress Notes (Addendum)
I was present with the medical student for this service. I personally verified the history of present illness, performed the physical exam, and made the plan for this encounter. I have verified the medical student's documentation and made modifications where appropriately. I have personally documented in my own words a brief history, physical, and plan below.    ? ?Doing well.  ?Eating and having Bms ?Dc home in the next 24 hours likely. ?Will see in person 03/29/22 ? ?Future Appointments  ?Date Time Provider [place]  ?03/29/2022 11:00 AM Virl Cagey, MD RS-RS None  ?04/08/2022  1:30 PM [facility]-IR 1 MC-IR [facility]  ?05/18/2022  7:05 AM [facility] DEVICE REMOTES [facility]  ?08/17/2022  7:05 AM [facility] DEVICE REMOTES [facility]  ?11/16/2022  7:05 AM [facility] DEVICE REMOTES [facility]  ?02/15/2023  7:05 AM [facility] DEVICE REMOTES [facility]  ?05/17/2023  7:05 AM [facility] DEVICE REMOTES [facility]  ?08/16/2023  7:05 AM [facility] DEVICE REMOTES [facility]  ? ? ?Curlene Labrum, MD ?[facility] ?[place], [place] [address] ?[phone] (office) ? ? ?[facility] Progress Note ? ?1 Day Post-Op  ?Subjective: ?Ms. Ozturk is a 81 y.o. female with multiple PMH including chronic cholecystitis with percutaneous cholecystostomy drain, now s/p cholecystectomy POD #1.  ?On morning rounds, patient is doing well. She denies any abdominal pain except for some mild soreness when sitting up. She was able to tolerate her full liquid diet yesterday without nausea or vomiting. She continues to have flatus and belching. She has not had a bowel movement yet but believes she will be able to once she eats more. She denies any fever or chills.  ? ?Objective: ?Vital signs in last 24 hours: ?Temp:  [97.7 ?F (36.5 ?C)-99.2 ?F (37.3 ?C)] 98.1 ?F (36.7 ?C) (03/23 0100) ?Pulse Rate:  [71-87]  78 (03/23 0515) ?Resp:  [18-25] 18 (03/23 0515) ?BP: (122-158)/(58-116) 158/70 (03/23 0515) ?SpO2:  [92 %-99 %] 94 % (03/23 0515) ?Weight:  [80.1 kg] 80.1 kg (03/22 1234) ?Last BM Date : 03/14/22 ? ?Intake/Output from previous day: ?03/22 0701 - 03/23 0700 ?In: 1700 [I.V.:1500; IV Piggyback:200] ?Out: 715 [Urine:600; Blood:15] ?Intake/Output this shift: ?No intake/output data recorded. ? ?General appearance: alert, cooperative, and no distress ?Resp: clear to auscultation bilaterally ?Cardio: regular rate and rhythm, S1, S2 normal, no murmur, click, rub or gallop ?GI: soft, non-tender; bowel sounds normal; no masses,  no organomegaly ?Extremities: Mild pedal edema bilaterally ? ?Lab Results:  ?Recent Labs  ?  03/16/22 ?4132 03/17/22 ?0429  ?WBC 9.4 9.6  ?HGB 8.6* 9.2*  ?HCT 28.5* 30.4*  ?PLT 211 230  ? ?BMET ?Recent Labs  ?  03/16/22 ?0437 03/17/22 ?0429  ?NA 140 138  ?K 3.7 4.1  ?CL 108 106  ?CO2 24 22  ?GLUCOSE 134* 226*  ?BUN <5* 8  ?CREATININE 0.79 0.83  ?CALCIUM 7.5* 8.1*  ? ?PT/INR ?No results for input(s): LABPROT, INR in the last 72 hours. ? ?Studies/Results: ?DG ERCP ? ?Result Date: 03/15/2022 ?CLINICAL DATA:  81 year old female with history of choledocholithiasis. EXAM: ERCP TECHNIQUE: Multiple spot images obtained with the fluoroscopic device and submitted for interpretation post-procedure. FLUOROSCOPY TIME:  Fluoroscopy Time:  2 minutes, 27 seconds Radiation Exposure Index (if provided by the fluoroscopic device): 52.64 mGy Number of Acquired Spot Images: 8 COMPARISON:  CT abdomen pelvis from 03/13/2022 FINDINGS: Duodenal scope is positioned over the second portion the duodenum. There is retrograde cannulation of the common bile duct. Retrograde cholangiogram was  performed which demonstrates moderate dilation of the common bile duct with filling defect in the ampulla. There is retrograde opacification of the cystic duct which appears patent. Cholecystostomy tube remains in place. Sphincterotomy and stone  extraction with balloon sweep are performed. IMPRESSION: Distal common bile duct obstructive choledocholithiasis. Balloon sweep and stone extraction performed. These images were submitted for radiologic interpretation only. Please see the procedural report for the full procedural details as well as amount of contrast and the fluoroscopy time utilized. Electronically Signed   By: Ruthann Cancer M.D.   On: 03/15/2022 14:40   ? ?Anti-infectives: ?Anti-infectives (From admission, onward)  ? ? Start     Dose/Rate Route Frequency Ordered Stop  ? 03/16/22 0800  ciprofloxacin (CIPRO) IVPB 400 mg       ? 400 mg ?200 mL/hr over 60 Minutes Intravenous On call to O.R. 03/16/22 0713 03/16/22 1343  ? 03/14/22 0600  cefTRIAXone (ROCEPHIN) 2 g in sodium chloride 0.9 % 100 mL IVPB       ?See Hyperspace for full Linked Orders Report.  ? 2 g ?200 mL/hr over 30 Minutes Intravenous Every 24 hours 03/13/22 1104    ? 03/13/22 2000  metroNIDAZOLE (FLAGYL) IVPB 500 mg       ?See Hyperspace for full Linked Orders Report.  ? 500 mg ?100 mL/hr over 60 Minutes Intravenous Every 12 hours 03/13/22 1104    ? 03/13/22 0245  cefTRIAXone (ROCEPHIN) 2 g in sodium chloride 0.9 % 100 mL IVPB       ?See Hyperspace for full Linked Orders Report.  ? 2 g ?200 mL/hr over 30 Minutes Intravenous  Once 03/13/22 0240 03/13/22 0431  ? 03/13/22 0245  metroNIDAZOLE (FLAGYL) IVPB 500 mg       ?See Hyperspace for full Linked Orders Report.  ? 500 mg ?100 mL/hr over 60 Minutes Intravenous  Once 03/13/22 0240 03/13/22 0651  ? ?  ? ? ?Assessment/Plan: ?s/p Procedure(s): ?LAPAROSCOPIC CHOLECYSTECTOMY ? ?Patient is a 81 y.o. female, POD #1 from laparoscopic cholecystectomy for chronic cholecystitis with cholecystostomy drain. Patient is doing well postoperatively with no abdominal pain, nausea, or vomiting and continues to pass flatus. No bowel movement yet. She is tolerating her full liquid diet well.  ?-Physical exam is reassuring. Incision sites are non-erythematous,  clean, dry, and well-healing. Skin glue in place.  ?-Labs are reassuring with no leukocytosis.  ?-Patient to advance diet as tolerated.  ? ? LOS: 4 days  ? ? ?Haejung Scherrie Bateman ?03/17/2022 ? ? ? ?

## 2022-03-18 DIAGNOSIS — E669 Obesity, unspecified: Secondary | ICD-10-CM

## 2022-03-18 DIAGNOSIS — I10 Essential (primary) hypertension: Secondary | ICD-10-CM

## 2022-03-18 LAB — BASIC METABOLIC PANEL
Anion gap: 6 (ref 5–15)
BUN: 8 mg/dL (ref 8–23)
CO2: 25 mmol/L (ref 22–32)
Calcium: 7.9 mg/dL — ABNORMAL LOW (ref 8.9–10.3)
Chloride: 108 mmol/L (ref 98–111)
Creatinine, Ser: 0.84 mg/dL (ref 0.44–1.00)
GFR, Estimated: >60 mL/min (ref >60)
Glucose, Bld: 205 mg/dL — ABNORMAL HIGH (ref 70–99)
Potassium: 3.9 mmol/L (ref 3.5–5.1)
Sodium: 139 mmol/L (ref 135–145)

## 2022-03-18 LAB — SURGICAL PATHOLOGY

## 2022-03-18 LAB — CBC
HCT: 28.6 % — ABNORMAL LOW (ref 36.0–46.0)
Hemoglobin: 8.6 g/dL — ABNORMAL LOW (ref 12.0–15.0)
MCH: 28.9 pg (ref 26.0–34.0)
MCHC: 30.1 g/dL (ref 30.0–36.0)
MCV: 96 fL (ref 80.0–100.0)
Platelets: 211 10*3/uL (ref 150–400)
RBC: 2.98 MIL/uL — ABNORMAL LOW (ref 3.87–5.11)
RDW: 14 % (ref 11.5–15.5)
WBC: 12.4 10*3/uL — ABNORMAL HIGH (ref 4.0–10.5)
nRBC: 0 % (ref 0.0–0.2)

## 2022-03-18 LAB — CULTURE, BLOOD (ROUTINE X 2)
Culture: NO GROWTH
Special Requests: ADEQUATE

## 2022-03-18 LAB — MAGNESIUM: Magnesium: 1.8 mg/dL (ref 1.7–2.4)

## 2022-03-18 LAB — GLUCOSE, CAPILLARY
Glucose-Capillary: 170 mg/dL — ABNORMAL HIGH (ref 70–99)
Glucose-Capillary: 187 mg/dL — ABNORMAL HIGH (ref 70–99)

## 2022-03-18 MED ORDER — CYANOCOBALAMIN 500 MCG PO TABS: 500.0000 ug | ORAL_TABLET | Freq: Every day | ORAL | Status: DC

## 2022-03-18 MED ORDER — CIPROFLOXACIN HCL 500 MG PO TABS
500.0000 mg | ORAL_TABLET | Freq: Two times a day (BID) | ORAL | 0 refills | Status: DC
Start: 2022-03-18 — End: 2022-03-29

## 2022-03-18 NOTE — TOC Transition Note (Signed)
Transition of Care (TOC) - CM/SW Discharge Note ? ? ?Patient Details  ?Name: Natalie Castro ?MRN: [id number] ?Date of Birth: [birth year] ? ?Transition of Care (TOC) CM/SW Contact:  ?Boneta Lucks, RN ?Phone Number: ?03/18/2022, 11:57 AM ? ? ?Clinical Narrative:   Discharging home today with [facility] home health, MD aware to order HHPT/OT. [facility] delivered hospital bed to the daughter's house.  ? ? ?Final next level of care: [place] ?Barriers to Discharge: Barriers Resolved ? ? ?Patient Goals and CMS Choice ?Patient states their goals for this hospitalization and ongoing recovery are:: return home ?  ?Choice offered to / list presented to : Patient ? ?Discharge Placement ?   ?  ?Patient and family notified of of transfer: 03/18/22 ? ?Discharge Plan and Services ?In-house Referral: Clinical Social Work ?  ?Post Acute Care Choice: Resumption of Svcs/PTA Provider          ?     ?  ?HH Arranged: PT, OT ?[place] Agency: [facility] ?Date HH Agency Contacted: 03/17/22 ?Time [place]: 2440 ?Representative spoke with at [place]: Tommi Rumps ? ? ?Readmission Risk Interventions ? ?  03/17/2022  ? 11:01 AM 07/11/2021  ?  1:23 PM  ?Readmission Risk Prevention Plan  ?Transportation Screening Complete Complete  ?PCP or Specialist Appt within 5-7 Days  Complete  ?Home Care Screening  Complete  ?Medication Review (RN CM)  Complete  ?[place] or Home Care Consult Complete   ?Social Work Consult for [place] Planning/Counseling Complete   ?Palliative Care Screening Not Applicable   ?Medication Review Press photographer) Complete   ? ? ? ? ? ?

## 2022-03-18 NOTE — Discharge Summary (Signed)
?Physician Discharge Summary ?  ?Patient: Natalie Castro MRN: [id number] DOB: Mar 17, 1941  ?Admit date:     03/13/2022  ?Discharge date: 03/18/22  ?Discharge Physician: Shanon Brow Shaylin Blatt  ? ?PCP: Redmond School, MD  ? ?Recommendations at discharge:  ? Please follow up with primary care provider within 1-2 weeks ? Please repeat BMP and CBC in one week ? ? ? ?Hospital Course: ?81 y.o. female with history HFpEF, coronary disease, diabetes mellitus type 2, complete heart block status post permanent pacemaker, hypertension, hyperlipidemia, pulmonary embolus on apixaban, and and chronic cholecystitis with percutaneous drain who presented to the ED with worsening generalized abdominal pain as well as nausea and vomiting that appears to have began in the last 1 day.  She was noted to have a fever as well.  Upon chart review, it appears that she was supposed to have her percutaneous cholecystostomy drain replaced on 3/3 due to the fact that it was dysfunctional, it does not appear to have been done.  Patient is quite lethargic at this time and cannot give any further history. ?  ?In the ED, CT scan of the abdomen and pelvis was performed with no acute process mentioned.  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  Cholecystostomy drain appears to be in good position.  She is noted to have significant LFT elevation and lipase is over 9000.  She has been started on 2 L fluid bolus and has been given Rocephin and Flagyl. ? ?03/14/2022:  Pt feeling much better.  Lipase way down to 51. Tolerating clear liquid diet.  Cardiology preop eval reporting intermediate risk for surgery.  Awaiting anesthesia team to give opinion.  ? ?03/15/2022:  ERCP and sphinterotomy performed with Dr. Laural Golden.  Anticipating cholecystectomy with Dr. Constance Haw on 03/16/22.   ? ?03/16/22: lap chole done without any immediate post-op complications ? ?03/17/22: changed ceftriaxone to cefazolin.  Pain in abd controlled.  Advanced diet to  soft diet.  Plan for d/c home 3/24 if tolerates diet and hospital bed delivered. ? ?03/18/22:  remains stable for d/c.  D/c home with cipro x 5 days to complete 10 day course of abx for Ecoli bacteremia ? ?Assessment and Plan: ?* Acute gallstone pancreatitis ?3/19 CT abd--multiple hepatic cysts with slight increase in size;  Chronic indwelling cholecystostomy tube appears stable and satisfactory ?Lipase 9034 upon presentation ?3/21 ERCP--Dilated CBD and CHD with large long tubular filling defect filling duct consistent with stone and sludge. ?Biliary sphincterotomy performed and stone/sludge removed with Damia basket and stone balloon extractor. ?3/22--lap chole ?03/17/22--advance diet to soft diet ? ?Sepsis ([place]) ?Sepsis physiology has resolved with supportive measures.  ?Presented with fever, leukocytosis and bacteremia ? ?E coli bacteremia ?Source is hepatobiliary ?Continue ceftriaxone ?3/23--de-escalated to cefazolin ?Plan to d/c home with 5 more days po abx to complete 10 days ? ?Chronic cholecystitis ?Cholecystotomy tube initially placed 06/2021 ?3/19 CT abd--appropriate positioning of chole tube ?Appreciate general surgery consult ? ? ?Breast mass, right ?Incidental finding on CT abd ?Present on CT chest 06/2021 ?Now larger on imaging ?Patient and family updated and made aware ?Will need mammogram after d/c ? ?Chronic diastolic CHF (congestive heart failure) ([place]) ?Appears clinically euvolemic ?Holding furosemide temporarily due to hypotension and sepsis initially ?-restarted lasix when tolerating po ? ?Hypokalemia ?IV replacement ordered.  ?Magnesium repleted.  ?Recheck bmp in AM ? ?Controlled type 2 diabetes mellitus without complication, without long-term current use of insulin ([place]) ?10/30/2021 hemoglobin A1c 6.8 ?02/05/22 A1C--6.6 ?  NovoLog sliding scale ?Holding metformin>>restart after d/c ? ? ?Hypomagnesemia ?IV replacement ordered 3/20 and repleted ?  ? ?B12 deficiency ?Restart po B12 when tolerating  po ? ?Obesity (BMI 30-39.9) ?Class 1 obesity ?BMI 30.31 ?Lifestyle modification ? ?Transaminitis ?LFTs are normalized. Lipase is down to 51.  ? ?Mixed hyperlipidemia ?Restart statin when tolerating po and LFTs normalize ? ?History of pulmonary embolus (PE) ?She is on apixaban PTA ?IV heparin bridge while apixaban on hold ?Restart apixaban when ok with surgery ? ?Complete heart block (HCC)/Saint Jude pacemaker ?Follow up Dr. [name] in outpatient setting ?Cardiology cleared pt for surgery without any further testing ? ?Essential hypertension, benign ?Holding nifedipine, hydralazine, losartan, metoprolol succinate initially ?Reintroduce antihypertensive medications gradually ?Metoprolol restarted ? ? ? ? ?  ? ? ?Consultants: general surgery; GI ?Procedures performed: lap chole  ?Disposition: Home ?Diet recommendation:  ?Carb modified diet ?DISCHARGE MEDICATION: ?Allergies as of 03/18/2022   ? ?   Reactions  ? Penicillins Hives, Itching, Other (See Comments)  ? Has tolerated ceftriaxone 7/22 ?Has patient had a PCN reaction causing immediate rash, facial/tongue/throat swelling, SOB or lightheadedness with hypotension: No ?Has patient had a PCN reaction causing severe rash involving mucus membranes or skin necrosis: Yes ?Has patient had a PCN reaction that required hospitalization No ?Has patient had a PCN reaction occurring within the last 10 years: No ?If all of the above answers are "NO", then may proceed with Cephalosporin use.  ? ?  ? ?  ?Medication List  ?  ? ?STOP taking these medications   ? ?cyanocobalamin 1000 MCG/ML injection ?Commonly known as: (VITAMIN B-12) ?Replaced by: vitamin B-12 500 MCG tablet ?  ?polyethylene glycol 17 g packet ?Commonly known as: MIRALAX / GLYCOLAX ?  ? ?  ? ?TAKE these medications   ? ?apixaban 5 MG Tabs tablet ?Commonly known as: Eliquis ?Take 1 tablet (5 mg total) by mouth 2 (two) times daily. ?  ?atorvastatin 40 MG tablet ?Commonly known as: LIPITOR ?Take 1 tablet (40 mg total) by  mouth daily. ?  ?BD AutoShield Duo 30G X 5 MM Misc ?Generic drug: Insulin Pen Needle ?30 gauge x 3/16" ?(pen needle,diabetic dual safty) ?  ?ciprofloxacin 500 MG tablet ?Commonly known as: CIPRO ?Take 1 tablet (500 mg total) by mouth 2 (two) times daily. ?  ?furosemide 40 MG tablet ?Commonly known as: LASIX ?Take 1 tablet (40 mg total) by mouth daily. ?  ?glucose blood test strip ?Use to check blood glucose fasting , before lunch and dinner and after your largest meal ?  ?Lantus SoloStar 100 UNIT/ML Solostar Pen ?Generic drug: insulin glargine ?Inject 10 Units into the skin at bedtime. ?  ?metFORMIN 1000 MG tablet ?Commonly known as: GLUCOPHAGE ?TAKE (1) TABLET BY MOUTH TWICE DAILY. ?  ?metoprolol succinate 50 MG 24 hr tablet ?Commonly known as: TOPROL-XL ?Take 1 tablet (50 mg total) by mouth daily. Take with or immediately following a meal. ?  ?nitroGLYCERIN 0.4 MG SL tablet ?Commonly known as: NITROSTAT ?Place 1 tablet (0.4 mg total) under the tongue every 5 (five) minutes as needed for chest pain. ?  ?onetouch ultrasoft lancets ?Use to check glucose 4 x daily ?  ?vitamin B-12 500 MCG tablet ?Commonly known as: CYANOCOBALAMIN ?Take 1 tablet (500 mcg total) by mouth daily. ?Start taking on: March 19, 2022 ?Replaces: cyanocobalamin 1000 MCG/ML injection ?  ? ?  ? ?  ?  ? ? ?  ?Durable Medical Equipment  ?(From admission, onward)  ?  ? ? ?  ? ?  Start     Ordered  ? 03/17/22 1217  For home use only DME Hospital bed  Once       ?Question Answer Comment  ?Length of Need 6 Months   ?The above medical condition requires: Patient requires the ability to reposition frequently   ?Bed type Semi-electric   ?  ? 03/17/22 1216  ? ?  ?  ? ?  ? ? Follow-up Information   ? ? Virl Cagey, MD Follow up on 03/29/2022.   ?Specialty: General Surgery ?Why: post op check ?Contact information: ?[address] ?[place] [address] ?[phone] ? ? ?  ?  ? ? Care, [facility] Follow up.   ?Specialty: Home Health  Services ?Why: PT/OT will call to schedule your home visit. ?Contact information: ?[address] ?[address] ?[place] [address] ?[phone] ? ? ?  ?  ? ? [facility], [facility] Follow up.   ?Why:

## 2022-03-29 ENCOUNTER — Ambulatory Visit (INDEPENDENT_AMBULATORY_CARE_PROVIDER_SITE_OTHER): Payer: Medicare HMO | Admitting: General Surgery

## 2022-03-29 ENCOUNTER — Encounter: Payer: Self-pay | Admitting: General Surgery

## 2022-03-29 ENCOUNTER — Other Ambulatory Visit: Payer: Self-pay

## 2022-03-29 VITALS — BP 157/73 | HR 67 | Temp 98.7°F | Resp 16 | Ht 64.0 in | Wt 195.0 lb

## 2022-03-29 DIAGNOSIS — N6310 Unspecified lump in the right breast, unspecified quadrant: Secondary | ICD-10-CM | POA: Diagnosis not present

## 2022-03-29 DIAGNOSIS — K8050 Calculus of bile duct without cholangitis or cholecystitis without obstruction: Secondary | ICD-10-CM

## 2022-03-29 DIAGNOSIS — I10 Essential (primary) hypertension: Secondary | ICD-10-CM | POA: Diagnosis not present

## 2022-03-29 DIAGNOSIS — E539 Vitamin B deficiency, unspecified: Secondary | ICD-10-CM | POA: Diagnosis not present

## 2022-03-29 DIAGNOSIS — K819 Cholecystitis, unspecified: Secondary | ICD-10-CM | POA: Diagnosis not present

## 2022-03-29 DIAGNOSIS — H9193 Unspecified hearing loss, bilateral: Secondary | ICD-10-CM | POA: Diagnosis not present

## 2022-03-29 NOTE — Addendum Note (Signed)
Addended by: Curlene Labrum on: 03/29/2022 11:18 AM ? ? Modules accepted: Orders ? ?

## 2022-03-29 NOTE — Addendum Note (Signed)
Addended by: Sheral Flow on: 03/29/2022 11:28 AM ? ? Modules accepted: Orders ? ?

## 2022-03-29 NOTE — Progress Notes (Addendum)
[facility] ? ?Looking great. Feeling great.  ? ? ?BP (!) 157/73   Pulse 67   Temp 98.7 ?F (37.1 ?C) (Oral)   Resp 16   Ht '5\' 4"'$  (1.626 m)   Wt 195 lb (88.5 kg)   SpO2 94%   BMI 33.47 kg/m?  ?Port sites healing. Drain site looks good  ?Soft, nontender, nondistended ?Right breast mass noted  ? ?Patient s/p lap cholecystectomy after cholecystostomy tube and gallstone pancreatitis. Cholecystostomy tube is out. Right breast mass, needs mammogram. Has not had one since she was 77.  ? ?Diet and activity as tolerated. ?Mammogram ordered. Once this is completed will touch base about the results. If no one has called to schedule this in the next 1 week. Call the office to check on the progress.  ?Call with issues.  ? ?Curlene Labrum, MD ?[facility] ?[place], [place] [address] ?[phone] (office) ? ?

## 2022-03-29 NOTE — Patient Instructions (Addendum)
Diet and activity as tolerated. ? ?Mammogram ordered. Once this is completed will touch base about the results. If no one has called to schedule this in the next 1 week. Call the office to check on the progress.  ?

## 2022-04-06 ENCOUNTER — Encounter (HOSPITAL_COMMUNITY): Payer: Self-pay | Admitting: General Surgery

## 2022-04-08 ENCOUNTER — Other Ambulatory Visit (HOSPITAL_COMMUNITY): Payer: Medicare HMO

## 2022-04-21 ENCOUNTER — Ambulatory Visit (HOSPITAL_COMMUNITY)
Admission: RE | Admit: 2022-04-21 | Discharge: 2022-04-21 | Disposition: A | Discharge status: Home / Self Care | Payer: Medicare HMO | Source: Ambulatory Visit | Attending: General Surgery | Admitting: General Surgery

## 2022-04-21 ENCOUNTER — Other Ambulatory Visit (HOSPITAL_COMMUNITY): Payer: Self-pay | Admitting: General Surgery

## 2022-04-21 DIAGNOSIS — N6310 Unspecified lump in the right breast, unspecified quadrant: Secondary | ICD-10-CM

## 2022-04-21 DIAGNOSIS — N6341 Unspecified lump in right breast, subareolar: Secondary | ICD-10-CM | POA: Diagnosis not present

## 2022-04-21 DIAGNOSIS — N6313 Unspecified lump in the right breast, lower outer quadrant: Secondary | ICD-10-CM | POA: Diagnosis not present

## 2022-04-21 DIAGNOSIS — R928 Other abnormal and inconclusive findings on diagnostic imaging of breast: Secondary | ICD-10-CM | POA: Diagnosis not present

## 2022-04-21 DIAGNOSIS — N6314 Unspecified lump in the right breast, lower inner quadrant: Secondary | ICD-10-CM | POA: Diagnosis not present

## 2022-04-22 DIAGNOSIS — A0811 Acute gastroenteropathy due to Norwalk agent: Secondary | ICD-10-CM | POA: Diagnosis not present

## 2022-04-22 DIAGNOSIS — I110 Hypertensive heart disease with heart failure: Secondary | ICD-10-CM | POA: Diagnosis not present

## 2022-04-22 DIAGNOSIS — K812 Acute cholecystitis with chronic cholecystitis: Secondary | ICD-10-CM | POA: Diagnosis not present

## 2022-04-22 DIAGNOSIS — E872 Acidosis, unspecified: Secondary | ICD-10-CM | POA: Diagnosis not present

## 2022-04-22 DIAGNOSIS — N179 Acute kidney failure, unspecified: Secondary | ICD-10-CM | POA: Diagnosis not present

## 2022-04-22 DIAGNOSIS — B9620 Unspecified Escherichia coli [E. coli] as the cause of diseases classified elsewhere: Secondary | ICD-10-CM | POA: Diagnosis not present

## 2022-04-22 DIAGNOSIS — R7401 Elevation of levels of liver transaminase levels: Secondary | ICD-10-CM | POA: Diagnosis not present

## 2022-04-22 DIAGNOSIS — R7881 Bacteremia: Secondary | ICD-10-CM | POA: Diagnosis not present

## 2022-04-22 DIAGNOSIS — I5032 Chronic diastolic (congestive) heart failure: Secondary | ICD-10-CM | POA: Diagnosis not present

## 2022-04-25 DIAGNOSIS — C50919 Malignant neoplasm of unspecified site of unspecified female breast: Secondary | ICD-10-CM

## 2022-04-25 HISTORY — DX: Malignant neoplasm of unspecified site of unspecified female breast: C50.919

## 2022-04-26 ENCOUNTER — Encounter (HOSPITAL_COMMUNITY): Payer: Self-pay

## 2022-04-26 ENCOUNTER — Other Ambulatory Visit (HOSPITAL_COMMUNITY): Payer: Self-pay | Admitting: Family Medicine

## 2022-04-26 ENCOUNTER — Ambulatory Visit (HOSPITAL_COMMUNITY)
Admission: RE | Admit: 2022-04-26 | Discharge: 2022-04-26 | Disposition: A | Discharge status: Home / Self Care | Payer: Medicare HMO | Source: Ambulatory Visit | Attending: Family Medicine | Admitting: Family Medicine

## 2022-04-26 ENCOUNTER — Ambulatory Visit (HOSPITAL_COMMUNITY)
Admission: RE | Admit: 2022-04-26 | Discharge: 2022-04-26 | Disposition: A | Discharge status: Home / Self Care | Payer: Medicare HMO | Source: Ambulatory Visit | Attending: General Surgery | Admitting: General Surgery

## 2022-04-26 DIAGNOSIS — C50811 Malignant neoplasm of overlapping sites of right female breast: Secondary | ICD-10-CM | POA: Diagnosis not present

## 2022-04-26 DIAGNOSIS — R928 Other abnormal and inconclusive findings on diagnostic imaging of breast: Secondary | ICD-10-CM | POA: Insufficient documentation

## 2022-04-26 DIAGNOSIS — N62 Hypertrophy of breast: Secondary | ICD-10-CM | POA: Diagnosis not present

## 2022-04-26 DIAGNOSIS — N6041 Mammary duct ectasia of right breast: Secondary | ICD-10-CM | POA: Diagnosis not present

## 2022-04-26 DIAGNOSIS — Z170 Estrogen receptor positive status [ER+]: Secondary | ICD-10-CM | POA: Diagnosis not present

## 2022-04-26 DIAGNOSIS — N6313 Unspecified lump in the right breast, lower outer quadrant: Secondary | ICD-10-CM | POA: Diagnosis not present

## 2022-04-26 MED ORDER — LIDOCAINE-EPINEPHRINE (PF) 1 %-1:200000 IJ SOLN
INTRAMUSCULAR | Status: AC
  Administered 2022-04-26: 10 mL
  Filled 2022-04-26: qty 30

## 2022-04-26 MED ORDER — LIDOCAINE HCL (PF) 2 % IJ SOLN
INTRAMUSCULAR | Status: AC
  Administered 2022-04-26: 10 mL
  Filled 2022-04-26: qty 10

## 2022-04-26 NOTE — Progress Notes (Signed)
PT tolerated right breast biopsies well today with NAD noted. PT verbalized understanding of discharge instructions. PT ambulated back to the mammogram area this time with walker and given ice packs and paper copy of discharge instructions.  ?

## 2022-04-28 ENCOUNTER — Ambulatory Visit: Payer: Medicare HMO | Admitting: General Surgery

## 2022-04-28 DIAGNOSIS — E119 Type 2 diabetes mellitus without complications: Secondary | ICD-10-CM | POA: Diagnosis not present

## 2022-04-28 DIAGNOSIS — Z794 Long term (current) use of insulin: Secondary | ICD-10-CM | POA: Diagnosis not present

## 2022-05-02 DIAGNOSIS — E119 Type 2 diabetes mellitus without complications: Secondary | ICD-10-CM | POA: Diagnosis not present

## 2022-05-02 DIAGNOSIS — I5030 Unspecified diastolic (congestive) heart failure: Secondary | ICD-10-CM | POA: Diagnosis not present

## 2022-05-02 DIAGNOSIS — K819 Cholecystitis, unspecified: Secondary | ICD-10-CM | POA: Diagnosis not present

## 2022-05-02 DIAGNOSIS — Z794 Long term (current) use of insulin: Secondary | ICD-10-CM | POA: Diagnosis not present

## 2022-05-02 DIAGNOSIS — Z950 Presence of cardiac pacemaker: Secondary | ICD-10-CM | POA: Diagnosis not present

## 2022-05-02 DIAGNOSIS — N6310 Unspecified lump in the right breast, unspecified quadrant: Secondary | ICD-10-CM | POA: Diagnosis not present

## 2022-05-02 DIAGNOSIS — E785 Hyperlipidemia, unspecified: Secondary | ICD-10-CM | POA: Diagnosis not present

## 2022-05-02 DIAGNOSIS — Z86711 Personal history of pulmonary embolism: Secondary | ICD-10-CM | POA: Diagnosis not present

## 2022-05-02 DIAGNOSIS — I10 Essential (primary) hypertension: Secondary | ICD-10-CM | POA: Diagnosis not present

## 2022-05-03 ENCOUNTER — Ambulatory Visit (INDEPENDENT_AMBULATORY_CARE_PROVIDER_SITE_OTHER): Payer: Medicare HMO | Admitting: General Surgery

## 2022-05-03 ENCOUNTER — Encounter: Payer: Self-pay | Admitting: General Surgery

## 2022-05-03 VITALS — BP 173/77 | HR 72 | Temp 98.1°F | Resp 12 | Ht 64.0 in | Wt 200.0 lb

## 2022-05-03 DIAGNOSIS — D0581 Other specified type of carcinoma in situ of right breast: Secondary | ICD-10-CM | POA: Diagnosis not present

## 2022-05-03 HISTORY — PX: BREAST BIOPSY: SHX20

## 2022-05-03 NOTE — Patient Instructions (Addendum)
Give your age. Lets get Dr. Delton Coombes to discuss options with you and non surgical options so you have the full information. 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  ? ?Ductal Carcinoma In Situ ? ?Ductal carcinoma in situ is the presence of abnormal cells in the breast. It is the earliest form of breast cancer. The abnormal cells are located only in the tubes that carry milk to the nipple (milk ducts) and have not spread to other areas. ?What are the causes? ?The exact cause of ductal carcinoma in situ is unknown. ?What increases the risk? ?The following factors increase the risk of developing ductal carcinoma in situ: ?Being older than 80 years of age. ?Being female. ?Having a family history of breast cancer. ?Starting menopause after age 38. ?Starting your menstrual periods before age 32. ?Having never been pregnant or having your first child after age 91. ?Having never breastfed. ?A personal history of: ?Breast cancer. ?Dense breast tissue. ?Radiation treatments to the breasts or chest area. ?Having the BRCA1 and BRCA2 genes. ?Having certain types of noncancerous (benign) breast conditions, such as non-proliferative lesions, proliferative lesions with or without atypia (cell abnormalities), or lobular carcinoma in situ. ?Exposure to the drug DES, which was given to pregnant women from the 1940s to the 1970s. ?Other risks include: ?Current or past hormone use, such as: ?Using birth control. ?Taking hormone therapy after menopause. ?Being overweight or obese after menopause. ?Having an inactive (sedentary) lifestyle. ?Drinking more than one alcoholic beverage a day. ?What are the signs or symptoms? ?Ductal carcinoma in situ does not cause any symptoms. ?How is this diagnosed? ?Ductal carcinoma in situ is usually discovered during a routine X-ray of the breasts to check for abnormal changes (mammogram). To diagnose the condition, your health care provider may do an ultrasound and remove a tissue  sample from your breast so it can be examined under a microscope (breast biopsy). ?Your health care provider may also remove one or more lymph nodes from under your arm to check if the abnormal cells have spread to your lymph nodes (sentinel lymph node biopsy). Lymph nodes are part of the body's disease-fighting system (immune system). They are located throughout the body. The lymph nodes under the arms are usually the first place where abnormal cells spread. ?How is this treated? ?Treatment for this condition may include: ?A lumpectomy. This is surgery to remove the area of abnormal cells, along with a ring of normal tissue. This may also be called breast-conserving surgery. ?Simple mastectomy. This is surgery to remove breast tissue, the nipple, and the circle of colored tissue around the nipple (areola). Sometimes, one or more lymph nodes from under the arm are also removed and tested for cancer cells. ?Preventive mastectomy. This is the removal of both breasts. This is usually done only if you have a very high risk of developing breast cancer. ?Radiation. This is the use of high-energy rays to kill cancer cells. ?Hormone therapy, which are medicines to keep the abnormal cells from spreading. ?Follow these instructions at home: ?Lifestyle ?Eat a healthy diet. A healthy diet includes lots of fruits and vegetables, low-fat dairy products, lean meats, and fiber. ?Make sure half your plate is filled with fruits or vegetables. ?Choose high-fiber foods such as whole-grain breads and cereals. ?Do not use any products that contain nicotine or tobacco. These products include cigarettes, chewing tobacco, and vaping devices, such as e-cigarettes. If you need help quitting, ask your health care provider. ?Alcohol use ?Do not drink  alcohol if: ?Your health care provider tells you not to drink. ?You are pregnant, may be pregnant, or are planning to become pregnant. ?If you drink alcohol: ?Limit how much you have to 0-1 drink a  day. ?Know how much alcohol is in your drink. In the [place] one drink equals one 12 oz bottle of beer (355 mL), one 5 oz glass of wine (148 mL), or one 1? oz glass of hard liquor (44 mL). ?General instructions ?Take over-the-counter and prescription medicines only as told by your health care provider. ?Keep all follow-up visits. This is important. ?Where to find more information ?American Cancer Society: www.cancer.org ?[place]: www.cancer.gov ?Contact a health care provider if: ?You have a fever. ?You notice a new lump in either breast or under your arm. ?You have any symptoms or changes that concern you. ?You notice new fatigue or weakness. ?Get help right away if: ?You have chest pain or trouble breathing. ?These symptoms may be an emergency. Get help right away. Call 911. ?Do not wait to see if the symptoms will go away. ?Do not drive yourself to the hospital. ?Summary ?Ductal carcinoma in situ is the presence of abnormal cells in the breast. It is the earliest form of breast cancer. ?The exact cause of ductal carcinoma in situ is unknown. Among other factors, the risk increases with age and with current or past hormone use. ?To diagnose the condition, your health care provider will remove a tissue sample from your breast so it can be examined under a microscope (breast biopsy). ?This information is not intended to replace advice given to you by your health care provider. Make sure you discuss any questions you have with your health care provider. ?Document Revised: 09/16/2021 Document Reviewed: 09/16/2021 ?Elsevier Patient Education ? [place]. ? ?

## 2022-05-03 NOTE — Progress Notes (Signed)
[facility] History and Physical ? ? ? ?Chief Complaint   ?Follow-up ?  ? ? ?Natalie Castro is a 81 y.o. female.  ?HPI: Natalie Castro is an 81 yo know to me as she had her gallbladder removed 03/16/2022 after having a cholecystostomy tube in place since July 2022. She had initially thought to be not a surgical candidate during her initial evaluation but had improved greatly over the last few months and was also starting to suffer from some choledocholithiasis. At that time she was due for a mammogram and had been noted to have a right breast nodule. 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  ? ?She has a history of CHF, HTN, Pacemaker, prior PE, and underwent cholecystectomy without issues post operatively.  ? ?She was sent back here for further discussion and evaluation. She is here today with her daughters.  ? ?Past Medical History:  ?Diagnosis Date  ? Acid reflux   ? Arthritis   ? Colon adenomas   ? AGE 89  ? Complete heart block ([place])   ? STJ PPM Dr. [name] 11/24/15  ? Essential hypertension   ? History of pulmonary embolism 2017  ? unprovoked, long term anticoag with apixaban   ? Hyperlipidemia   ? Myoview 03/2021   ? Myoview 4/22: EF 50, no infarct or ischemia; low risk  ? Presence of permanent cardiac pacemaker   ? Type 2 diabetes mellitus ([place])   ? ? ?Past Surgical History:  ?Procedure Laterality Date  ? CHOLECYSTECTOMY N/A 03/16/2022  ? Procedure: LAPAROSCOPIC CHOLECYSTECTOMY;  Surgeon: Virl Cagey, MD;  Location: [facility];  Service: General;  Laterality: N/A;  ? COLONOSCOPY    ? 3 SIMPLE ADENOMAS, AGE 64  ? COLONOSCOPY N/A 05/24/2018  ? Procedure: COLONOSCOPY;  Surgeon: Danie Binder, MD;  Location: [facility];  Service: Endoscopy;  Laterality: N/A;  1:00pm  ? ENDOSCOPIC RETROGRADE CHOLANGIOPANCREATOGRAPHY (ERCP) WITH PROPOFOL N/A 03/15/2022  ? Procedure: ENDOSCOPIC  RETROGRADE CHOLANGIOPANCREATOGRAPHY (ERCP) WITH PROPOFOL;  Surgeon: Rogene Houston, MD;  Location: [facility];  Service: Endoscopy;  Laterality: N/A;  with sphincterotomy and stone retrieval  ? EP IMPLANTABLE DEVICE N/A 11/24/2015  ? Procedure: Pacemaker Implant;  Surgeon: [name], MD;  Location: [facility];  Service: Cardiovascular;  Laterality: N/A;  ? IR EXCHANGE BILIARY DRAIN  07/27/2021  ? IR EXCHANGE BILIARY DRAIN  08/23/2021  ? IR EXCHANGE BILIARY DRAIN  02/25/2022  ? IR PERC CHOLECYSTOSTOMY  07/08/2021  ? IR PERC CHOLECYSTOSTOMY  11/04/2021  ? IR RADIOLOGIST EVAL & MGMT  09/22/2021  ? 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  ? LEFT HEART CATH AND CORONARY ANGIOGRAPHY N/A 07/02/2021  ? Procedure: LEFT HEART CATH AND CORONARY ANGIOGRAPHY;  Surgeon: Jettie Booze, MD;  Location: [facility];  Service: Cardiovascular;  Laterality: N/A;  ? POLYPECTOMY  05/24/2018  ? Procedure: POLYPECTOMY;  Surgeon: Danie Binder, MD;  [facility];  Service: Endoscopy;;  ascending and hepatic flexure, transverse  ? REMOVAL OF STONES N/A 03/15/2022  ? Procedure: STONE AND SLUDGE EXTRACTION;  Surgeon: Rogene Houston, MD;  Location: [facility];  Service: Endoscopy;  Laterality: N/A;  ? TEE WITHOUT CARDIOVERSION N/A 08/27/2021  ? Procedure: TRANSESOPHAGEAL ECHOCARDIOGRAM (TEE);  Surgeon: Pixie Casino, MD;  Location: [facility];  Service: Cardiovascular;  Laterality: N/A;  ?  TUBAL LIGATION    ? ? ?Family History  ?Problem Relation Age of Onset  ? Heart failure Mother   ? Hypertension Mother   ? Heart failure Sister   ? Hypertension Father   ? Cancer Sister   ? Dementia Sister   ? Colon cancer Neg Hx   ? Colon polyps Neg Hx   ? ? ?Social History  ? ?Tobacco Use  ? Smoking status: Never  ? Smokeless tobacco: Never  ?Vaping Use  ? Vaping Use: Never used  ?Substance Use Topics  ? Alcohol use: No  ?  Alcohol/week: 0.0 standard drinks  ? Drug use: No  ? ? ?Medications: I have reviewed the patient's current  medications. ?Allergies as of 05/03/2022   ? ?   Reactions  ? Penicillins Hives, Itching, Other (See Comments)  ? Has tolerated ceftriaxone 7/22 ?Has patient had a PCN reaction causing immediate rash, facial/tongue/throat swelling, SOB or lightheadedness with hypotension: No ?Has patient had a PCN reaction causing severe rash involving mucus membranes or skin necrosis: Yes ?Has patient had a PCN reaction that required hospitalization No ?Has patient had a PCN reaction occurring within the last 10 years: No ?If all of the above answers are "NO", then may proceed with Cephalosporin use.  ? ?  ? ?  ?Medication List  ?  ? ?  ? Accurate as of May 03, 2022 12:01 PM. If you have any questions, ask your nurse or doctor.  ?  ?  ? ?  ? ?apixaban 5 MG Tabs tablet ?Commonly known as: Eliquis ?Take 1 tablet (5 mg total) by mouth 2 (two) times daily. ?  ?atorvastatin 40 MG tablet ?Commonly known as: LIPITOR ?Take 1 tablet (40 mg total) by mouth daily. ?  ?BD AutoShield Duo 30G X 5 MM Misc ?Generic drug: Insulin Pen Needle ?30 gauge x 3/16" ?(pen needle,diabetic dual safty) ?  ?furosemide 40 MG tablet ?Commonly known as: LASIX ?Take 1 tablet (40 mg total) by mouth daily. ?  ?glucose blood test strip ?Use to check blood glucose fasting , before lunch and dinner and after your largest meal ?  ?Lantus SoloStar 100 UNIT/ML Solostar Pen ?Generic drug: insulin glargine ?Inject 10 Units into the skin at bedtime. ?  ?losartan 100 MG tablet ?Commonly known as: COZAAR ?Take 100 mg by mouth daily. X 3 days (Starting 05/03/2022) ?  ?metFORMIN 1000 MG tablet ?Commonly known as: GLUCOPHAGE ?TAKE (1) TABLET BY MOUTH TWICE DAILY. ?  ?metoprolol succinate 50 MG 24 hr tablet ?Commonly known as: TOPROL-XL ?Take 1 tablet (50 mg total) by mouth daily. Take with or immediately following a meal. ?  ?nitroGLYCERIN 0.4 MG SL tablet ?Commonly known as: NITROSTAT ?Place 1 tablet (0.4 mg total) under the tongue every 5 (five) minutes as needed for chest pain. ?   ?onetouch ultrasoft lancets ?Use to check glucose 4 x daily ?  ?vitamin B-12 500 MCG tablet ?Commonly known as: CYANOCOBALAMIN ?Take 1 tablet (500 mcg total) by mouth daily. ?  ? ?  ? ? ? ?ROS:  ?A comprehensive review of systems was negative except for: Integument/breast: positive for right breast biopsy tenderness ? ?Blood pressure (!) 173/77, pulse 72, temperature 98.1 ?F (36.7 ?C), temperature source Oral, resp. rate 12, height '5\' 4"'$  (1.626 m), weight 200 lb (90.7 kg), SpO2 95 %. ?Physical Exam ?Vitals reviewed.  ?Constitutional:   ?   Appearance: Normal appearance.  ?HENT:  ?   Head: Normocephalic.  ?   Mouth/Throat:  ?  Mouth: Mucous membranes are moist.  ?Eyes:  ?   Extraocular Movements: Extraocular movements intact.  ?Cardiovascular:  ?   Rate and Rhythm: Normal rate.  ?Pulmonary:  ?   Effort: Pulmonary effort is normal.  ?Chest:  ?   Comments: Right breast with no bruising, palpable area just inferior to areola  ?Abdominal:  ?   General: There is no distension.  ?   Palpations: Abdomen is soft.  ?   Tenderness: There is no abdominal tenderness.  ?Musculoskeletal:  ?   Cervical back: Normal range of motion.  ?Skin: ?   General: Skin is warm.  ?Neurological:  ?   General: No focal deficit present.  ?   Mental Status: She is alert.  ?Psychiatric:     ?   Mood and Affect: Mood normal.  ? ? ?Results: ?CLINICAL DATA:  81 year old female presenting for evaluation of a ?mass in the right breast identified on a recent CT of the abdomen ?and pelvis 03/13/2022. The patient states that she does have a ?palpable lump in the right breast. ?  ?EXAM: ?DIGITAL DIAGNOSTIC BILATERAL MAMMOGRAM WITH TOMOSYNTHESIS AND CAD; ?ULTRASOUND RIGHT BREAST LIMITED ?  ?TECHNIQUE: ?Bilateral digital diagnostic mammography and breast tomosynthesis ?was performed. The images were evaluated with computer-aided ?detection.; Targeted ultrasound examination of the right breast was ?performed ?  ?COMPARISON:  Previous exam(s). ?  ?ACR Breast  Density Category b: There are scattered areas of ?fibroglandular density. ?  ?FINDINGS: ?In the slightly inferior far posterior right breast there is an ?irregular mass with associated coarse internal calcificati

## 2022-05-04 LAB — SURGICAL PATHOLOGY

## 2022-05-16 ENCOUNTER — Other Ambulatory Visit (HOSPITAL_COMMUNITY): Payer: Medicare HMO

## 2022-05-16 ENCOUNTER — Inpatient Hospital Stay (HOSPITAL_COMMUNITY): Payer: Medicare HMO | Attending: Hematology | Admitting: Hematology

## 2022-05-16 VITALS — BP 152/82 | HR 70 | Temp 97.5°F | Resp 18 | Ht 64.0 in | Wt 201.6 lb

## 2022-05-16 DIAGNOSIS — R200 Anesthesia of skin: Secondary | ICD-10-CM | POA: Diagnosis not present

## 2022-05-16 DIAGNOSIS — R5383 Other fatigue: Secondary | ICD-10-CM | POA: Diagnosis not present

## 2022-05-16 DIAGNOSIS — Z79899 Other long term (current) drug therapy: Secondary | ICD-10-CM | POA: Insufficient documentation

## 2022-05-16 DIAGNOSIS — Z803 Family history of malignant neoplasm of breast: Secondary | ICD-10-CM | POA: Diagnosis not present

## 2022-05-16 DIAGNOSIS — E559 Vitamin D deficiency, unspecified: Secondary | ICD-10-CM | POA: Insufficient documentation

## 2022-05-16 DIAGNOSIS — D0581 Other specified type of carcinoma in situ of right breast: Secondary | ICD-10-CM

## 2022-05-16 DIAGNOSIS — E119 Type 2 diabetes mellitus without complications: Secondary | ICD-10-CM | POA: Diagnosis not present

## 2022-05-16 DIAGNOSIS — Z794 Long term (current) use of insulin: Secondary | ICD-10-CM | POA: Diagnosis not present

## 2022-05-16 DIAGNOSIS — Z7901 Long term (current) use of anticoagulants: Secondary | ICD-10-CM | POA: Insufficient documentation

## 2022-05-16 DIAGNOSIS — D0511 Intraductal carcinoma in situ of right breast: Secondary | ICD-10-CM | POA: Diagnosis not present

## 2022-05-16 DIAGNOSIS — Z7984 Long term (current) use of oral hypoglycemic drugs: Secondary | ICD-10-CM | POA: Insufficient documentation

## 2022-05-16 DIAGNOSIS — Z9049 Acquired absence of other specified parts of digestive tract: Secondary | ICD-10-CM | POA: Insufficient documentation

## 2022-05-16 DIAGNOSIS — Z86711 Personal history of pulmonary embolism: Secondary | ICD-10-CM | POA: Diagnosis not present

## 2022-05-16 NOTE — Progress Notes (Signed)
[facility] [address] [place] [address]   Patient Care Team: Donnamae Jude, FNP as PCP - General (Family Medicine) Evans Lance, MD as PCP - Electrophysiology (Cardiology) Evans Lance, MD as Consulting Physician (Cardiology) Madelin Headings, DO (Optometry) Derek Jack, MD as Medical Oncologist (Medical Oncology) Brien Mates, RN as Oncology Nurse Navigator (Medical Oncology)  CHIEF COMPLAINTS/PURPOSE OF CONSULTATION:  Newly diagnosed right breast DCIS  HISTORY OF PRESENTING ILLNESS:  Natalie Castro 81 y.o. female is here because of recent diagnosis of right breast DCIS  Today she reports feeling good, and she is accompanied by her daughter. She recently underwent cholecystectomy on 03/16/22. She denies history of breast biopsies. She underwent tubal ligation following the birth of her last child. She uses a walker to walk longer distances, but she is able to walk without it at home. She denies new pains in the past 6 months. She denies fevers, night sweats, and unexpected weight loss. She was able to palpate the lump in her right breast prior to her mammogram in April. She denies history of MI.   She currently lives at home with her daughter who works full time as a Pharmacist, hospital. 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 She denies smoking history. Her sister had metastatic breast cancer, and she otherwise denies family history of cancer.   In terms of breast cancer risk profile:  She menarched at early age of 45 and went to menopause in her early 65s  She had 5 pregnancies, her first child was born at age 85  She never received birth control pills.  She was never exposed to fertility medications or hormone replacement therapy.  She has family history of Breast/GYN/GI cancer  I reviewed her records extensively and collaborated the history with the patient.  SUMMARY OF ONCOLOGIC HISTORY: Oncology History   No history  exists.    MEDICAL HISTORY:  Past Medical History:  Diagnosis Date   Acid reflux    Arthritis    Colon adenomas    AGE 46   Complete heart block (HCC)    STJ PPM Dr. 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, long term anticoag with apixaban    Hyperlipidemia    Myoview 03/2021    Myoview 4/22: EF 50, no infarct or ischemia; low risk   Presence of permanent cardiac pacemaker    Type 2 diabetes mellitus ([place])     SURGICAL HISTORY: Past Surgical History:  Procedure Laterality Date   CHOLECYSTECTOMY N/A 03/16/2022   Procedure: LAPAROSCOPIC CHOLECYSTECTOMY;  Surgeon: Virl Cagey, MD;  Location: [facility];  Service: General;  Laterality: N/A;   COLONOSCOPY     3 SIMPLE ADENOMAS, AGE 89   COLONOSCOPY N/A 05/24/2018   Procedure: COLONOSCOPY;  Surgeon: Danie Binder, MD;  Location: [facility];  Service: Endoscopy;  Laterality: N/A;  1:00pm   ENDOSCOPIC RETROGRADE CHOLANGIOPANCREATOGRAPHY (ERCP) WITH PROPOFOL N/A 03/15/2022   Procedure: ENDOSCOPIC RETROGRADE CHOLANGIOPANCREATOGRAPHY (ERCP) WITH PROPOFOL;  Surgeon: Rogene Houston, MD;  Location: [facility];  Service: Endoscopy;  Laterality: N/A;  with sphincterotomy and stone retrieval   EP IMPLANTABLE DEVICE N/A 11/24/2015   Procedure: Pacemaker Implant;  Surgeon: [name], MD;  Location: [facility];  Service: Cardiovascular;  Laterality: N/A;   IR EXCHANGE BILIARY DRAIN  07/27/2021   IR EXCHANGE BILIARY DRAIN  08/23/2021   IR EXCHANGE BILIARY DRAIN  02/25/2022   IR PERC CHOLECYSTOSTOMY  07/08/2021   IR PERC CHOLECYSTOSTOMY  11/04/2021   IR RADIOLOGIST EVAL & MGMT  09/22/2021   JOINT REPLACEMENT     knees bilat.   LEFT HEART CATH AND CORONARY ANGIOGRAPHY N/A 07/02/2021   Procedure: LEFT HEART CATH AND CORONARY ANGIOGRAPHY;  Surgeon: Jettie Booze, MD;  Location: [facility];  Service: Cardiovascular;  Laterality: N/A;   POLYPECTOMY  05/24/2018   Procedure:  POLYPECTOMY;  Surgeon: Danie Binder, MD;  [facility];  Service: Endoscopy;;  ascending and hepatic flexure, transverse   REMOVAL OF STONES N/A 03/15/2022   Procedure: STONE AND SLUDGE EXTRACTION;  Surgeon: Rogene Houston, MD;  Location: [facility];  Service: Endoscopy;  Laterality: N/A;   TEE WITHOUT CARDIOVERSION N/A 08/27/2021   Procedure: TRANSESOPHAGEAL ECHOCARDIOGRAM (TEE);  Surgeon: Pixie Casino, MD;  Location: [facility];  Service: Cardiovascular;  Laterality: N/A;   TUBAL LIGATION      SOCIAL HISTORY: Social History   Socioeconomic History   Marital status: Widowed    Spouse name: Not on file   Number of children: Not on file   Years of education: Not on file   Highest education level: Not on file  Occupational History   Occupation: retired  Tobacco Use   Smoking status: Never   Smokeless tobacco: Never  Vaping Use   Vaping Use: Never used  Substance and Sexual Activity   Alcohol use: No    Alcohol/week: 0.0 standard drinks   Drug use: No   Sexual activity: Yes    Birth control/protection: Post-menopausal  Other Topics Concern   Not on file  Social History Narrative   MARRIED FOR 45 YRS. 5 KIDS: #4 PRESENT TODAY(AGE 71)   Social Determinants of Health   Financial Resource Strain: Not on file  Food Insecurity: Not on file  Transportation Needs: Not on file  Physical Activity: Not on file  Stress: Not on file  Social Connections: Not on file  Intimate Partner Violence: Not on file    FAMILY HISTORY: Family History  Problem Relation Age of Onset   Heart failure Mother    Hypertension Mother    Heart failure Sister    Hypertension Father    Cancer Sister    Dementia Sister    Colon cancer Neg Hx    Colon polyps Neg Hx     ALLERGIES:  is allergic to penicillins.  MEDICATIONS:  Current Outpatient Medications  Medication Sig Dispense Refill   apixaban (ELIQUIS) 5 MG TABS tablet Take 1 tablet (5 mg total) by mouth 2 (two) times daily.  60 tablet 0   atorvastatin (LIPITOR) 40 MG tablet Take 1 tablet (40 mg total) by mouth daily. 30 tablet 0   furosemide (LASIX) 40 MG tablet Take 1 tablet (40 mg total) by mouth daily. 30 tablet 0   glucose blood test strip Use to check blood glucose fasting , before lunch and dinner and after your largest meal 120 each 0   Insulin Pen Needle (BD AUTOSHIELD DUO) 30G X 5 MM MISC 30 gauge x 3/16" (pen needle,diabetic dual safty)     Lancets (ONETOUCH ULTRASOFT) lancets Use to check glucose 4 x daily 120 each 0   LANTUS SOLOSTAR 100 UNIT/ML Solostar Pen Inject 10 Units into the skin at bedtime. 15 mL 0   losartan (COZAAR) 100 MG tablet Take 100 mg by mouth daily. X 3 days (Starting  05/03/2022)     metFORMIN (GLUCOPHAGE) 1000 MG tablet TAKE (1) TABLET BY MOUTH TWICE DAILY. 60 tablet 0   metoprolol succinate (TOPROL-XL) 50 MG 24 hr tablet Take 1 tablet (50 mg total) by mouth daily. Take with or immediately following a meal. 60 tablet 0   nitroGLYCERIN (NITROSTAT) 0.4 MG SL tablet Place 1 tablet (0.4 mg total) under the tongue every 5 (five) minutes as needed for chest pain. 25 tablet 0   vitamin B-12 (CYANOCOBALAMIN) 500 MCG tablet Take 1 tablet (500 mcg total) by mouth daily.     hydrALAZINE (APRESOLINE) 50 MG tablet      NIFEdipine (PROCARDIA-XL/NIFEDICAL-XL) 30 MG 24 hr tablet      No current facility-administered medications for this visit.    REVIEW OF SYSTEMS:   Review of Systems  Constitutional:  Positive for fatigue. Negative for appetite change, fever and unexpected weight change.  HENT:   Positive for trouble swallowing.   Respiratory:  Positive for cough and shortness of breath.   Endocrine: Negative for hot flashes.  Genitourinary:  Positive for frequency.   Neurological:  Positive for numbness.  Psychiatric/Behavioral:  Positive for sleep disturbance.   All other systems reviewed and are negative.  PHYSICAL EXAMINATION: ECOG PERFORMANCE STATUS: 1 - Symptomatic but completely  ambulatory  Vitals:   05/16/22 1317  BP: (!) 152/82  Pulse: 70  Resp: 18  Temp: (!) 97.5 F (36.4 C)  SpO2: 96%   Filed Weights   05/16/22 1317  Weight: 201 lb 9.6 oz (91.4 kg)   Physical Exam Vitals reviewed.  Constitutional:      Appearance: Normal appearance.     Comments: In wheelchair  Cardiovascular:     Rate and Rhythm: Normal rate and regular rhythm.     Pulses: Normal pulses.     Heart sounds: Normal heart sounds.  Pulmonary:     Effort: Pulmonary effort is normal.     Breath sounds: Normal breath sounds.  Chest:  Breasts:    Right: No swelling, bleeding, mass, skin change or tenderness.     Left: No swelling, bleeding, mass, skin change or tenderness.  Lymphadenopathy:     Upper Body:     Right upper body: No supraclavicular or axillary adenopathy.     Left upper body: No supraclavicular or axillary adenopathy.  Neurological:     General: No focal deficit present.     Mental Status: She is alert and oriented to person, place, and time.  Psychiatric:        Mood and Affect: Mood normal.        Behavior: Behavior normal.    Breast Exam Chaperone: Thana Ates    LABORATORY DATA:  I have reviewed the data as listed  RADIOGRAPHIC STUDIES: I have personally reviewed the radiological reports and agreed with the findings in the report. US BREAST LTD UNI RIGHT INC AXILLA  Result Date: 04/21/2022 CLINICAL DATA:  81 year old female presenting for evaluation of a mass in the right breast identified on a recent CT of the abdomen and pelvis 03/13/2022. The patient states that she does have a palpable lump in the right breast. EXAM: DIGITAL DIAGNOSTIC BILATERAL MAMMOGRAM WITH TOMOSYNTHESIS AND CAD; ULTRASOUND RIGHT BREAST LIMITED TECHNIQUE: Bilateral digital diagnostic mammography and breast tomosynthesis was performed. The images were evaluated with computer-aided detection.; Targeted ultrasound examination of the right breast was performed COMPARISON:  Previous  exam(s). ACR Breast Density Category b: There are scattered areas of fibroglandular density. FINDINGS: In the slightly inferior  far posterior right breast there is an irregular mass with associated coarse internal calcifications. The mass measures approximately 2.5 cm. There appears to be right nipple retraction, and new density extending between the nipple and the mass. The skin marker indicating the palpable lump pointed out by the patient lies just above the right nipple. There are no definite masses just inferior to this marker, however she may be palpating the mass which is deeper within the breast behind the skin marker. No suspicious calcifications, masses or areas of distortion are seen in the left breast. There is a firm palpable mass in the central posterior right breast on my exam. Ultrasound targeted to the right breast at 6 o'clock, 2 cm from the nipple demonstrates an irregular oval hypoechoic mass measuring 2.4 x 1.4 x 2.0 cm. There are dilated ducts extending between the mass and the base of the nipple. In one of the ducts at the base of the nipple at 6 o'clock, there is a small oval vascular hypoechoic mass measuring 0.6 x 0.4 x 0.5 cm. Ultrasound of the right axilla demonstrates normal-appearing lymph nodes. IMPRESSION: 1. There is a highly suspicious 2.4 cm mass in the right breast at 6 o'clock. This corresponds in size and location with the mass seen on the patient's recent CT. 2. There is a 0.6 cm mass in a dilated duct in the retroareolar right breast at 6 o'clock. This may represent a papilloma versus a satellite lesion. 3.  No evidence of right axillary lymphadenopathy. 4.  No evidence of left breast malignancy. RECOMMENDATION: Ultrasound guided biopsy is recommended for the 2.4 cm right breast mass, as well as the 0.6 cm mass in the retroareolar right breast. The patient will schedule the biopsy at her earliest convenience. I have discussed the findings and recommendations with the patient.  If applicable, a reminder letter will be sent to the patient regarding the next appointment. BI-RADS CATEGORY  5: Highly suggestive of malignancy. Electronically Signed   By: Ammie Ferrier M.D.   On: 04/21/2022 11:19  MM DIAG BREAST TOMO BILATERAL  Result Date: 04/21/2022 CLINICAL DATA:  81 year old female presenting for evaluation of a mass in the right breast identified on a recent CT of the abdomen and pelvis 03/13/2022. The patient states that she does have a palpable lump in the right breast. EXAM: DIGITAL DIAGNOSTIC BILATERAL MAMMOGRAM WITH TOMOSYNTHESIS AND CAD; ULTRASOUND RIGHT BREAST LIMITED TECHNIQUE: Bilateral digital diagnostic mammography and breast tomosynthesis was performed. The images were evaluated with computer-aided detection.; Targeted ultrasound examination of the right breast was performed COMPARISON:  Previous exam(s). ACR Breast Density Category b: There are scattered areas of fibroglandular density. FINDINGS: In the slightly inferior far posterior right breast there is an irregular mass with associated coarse internal calcifications. The mass measures approximately 2.5 cm. There appears to be right nipple retraction, and new density extending between the nipple and the mass. The skin marker indicating the palpable lump pointed out by the patient lies just above the right nipple. There are no definite masses just inferior to this marker, however she may be palpating the mass which is deeper within the breast behind the skin marker. No suspicious calcifications, masses or areas of distortion are seen in the left breast. There is a firm palpable mass in the central posterior right breast on my exam. Ultrasound targeted to the right breast at 6 o'clock, 2 cm from the nipple demonstrates an irregular oval hypoechoic mass measuring 2.4 x 1.4 x 2.0 cm. There are dilated  ducts extending between the mass and the base of the nipple. In one of the ducts at the base of the nipple at 6 o'clock,  there is a small oval vascular hypoechoic mass measuring 0.6 x 0.4 x 0.5 cm. Ultrasound of the right axilla demonstrates normal-appearing lymph nodes. IMPRESSION: 1. There is a highly suspicious 2.4 cm mass in the right breast at 6 o'clock. This corresponds in size and location with the mass seen on the patient's recent CT. 2. There is a 0.6 cm mass in a dilated duct in the retroareolar right breast at 6 o'clock. This may represent a papilloma versus a satellite lesion. 3.  No evidence of right axillary lymphadenopathy. 4.  No evidence of left breast malignancy. RECOMMENDATION: Ultrasound guided biopsy is recommended for the 2.4 cm right breast mass, as well as the 0.6 cm mass in the retroareolar right breast. The patient will schedule the biopsy at her earliest convenience. I have discussed the findings and recommendations with the patient. If applicable, a reminder letter will be sent to the patient regarding the next appointment. BI-RADS CATEGORY  5: Highly suggestive of malignancy. Electronically Signed   By: Ammie Ferrier M.D.   On: 04/21/2022 11:19  MM CLIP PLACEMENT RIGHT  Result Date: 04/26/2022 CLINICAL DATA:  Post ultrasound-guided biopsy of an intraductal mass in the right breast at 6 o'clock retroareolar as well as a suspicious mass in the right breast at 6 o'clock 2 cm from nipple. EXAM: 3D DIAGNOSTIC RIGHT MAMMOGRAM POST ULTRASOUND BIOPSY COMPARISON:  Previous exam(s). FINDINGS: 3D Mammographic images were obtained following ultrasound-guided biopsy of an intraductal mass in the right breast at 6 o'clock retroareolar as well as a suspicious mass in the right breast at 6 o'clock 2 cm from nipple. A ribbon shaped biopsy marking clip is present at the site of the biopsied intraductal mass in the right breast (at 6 o'clock) subareolar. A wing shaped biopsy marking clip is present at the site of the biopsied mass with associated calcifications in the right breast at 6 o'clock 2 cm from nipple.  IMPRESSION: 1. Ribbon shaped biopsy marking clip at site of biopsied mass in the retroareolar right breast. 2. Wing shaped biopsy marking clip at site of biopsied mass in the right breast at 6 o'clock 2 cm from. Final Assessment: Post Procedure Mammograms for Marker Placement Electronically Signed   By: Everlean Alstrom M.D.   On: 04/26/2022 08:53  [place] RT BREAST BX W LOC DEV 1ST LESION IMG BX SPEC US GUIDE  Addendum Date: 05/03/2022   ADDENDUM REPORT: 05/03/2022 07:38 ADDENDUM: Pathology revealed MARKED DUCT ECTASIA WITH FOCAL PAPILLARY EPITHELIAL HYPERPLASIA WITHOUT CYTOLOGIC ATYPIA-NEGATIVE FOR MALIGNANCY of the RIGHT breast, 6 o'clock, subareolar (ribbon clip). This was found to be discordant by Dr. Everlean Alstrom, with excision recommended. 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 Linear extent of carcinoma in the longest core: 16 mm. This was found to be concordant by Dr. Everlean Alstrom. COMMENT: The submitted specimen does not have features of invasion. However, possibility of invasive carcinoma cannot be excluded until the entire lesion is examined. Pathology results were discussed with the patient by telephone on 04/28/2022. The patient reported doing well after the biopsies with tenderness at the sites. Post biopsy instructions and care were reviewed and questions were answered. The patient was encouraged to call [facility] for any additional concerns. Appointment was rescheduled with Dr. Curlene Labrum at [facility] for May 03, 2022. Pathology results reported by Stacie Acres RN on 04/29/2022. Electronically Signed   By: Everlean Alstrom M.D.   On: 05/03/2022 07:38   Result Date: 05/03/2022 CLINICAL DATA:  81 year old female presents for ultrasound-guided core biopsy of a 0.5 cm mass in the retroareolar right breast as well as ultrasound-guided core biopsy of a 2.4 cm  mass in the right breast at 6 o'clock 2 cm from nipple. EXAM: ULTRASOUND GUIDED RIGHT BREAST CORE NEEDLE BIOPSY COMPARISON:  Previous exam(s). PROCEDURE: I met with the patient and we discussed the procedure of ultrasound-guided biopsy, including benefits and alternatives. We discussed the high likelihood of a successful procedure. We discussed the risks of the procedure, including infection, bleeding, tissue injury, clip migration, and inadequate sampling. Informed written consent was given. The usual time-out protocol was performed immediately prior to the procedure. SITE 1: RIGHT BREAST RETROAREOLAR: INTRADUCTAL MASS: Lesion quadrant: LOWER OUTER Using sterile technique and 1% Lidocaine as local anesthetic, under direct ultrasound visualization, a 14 gauge spring-loaded device was used to perform biopsy of the mass in the right breast at 6 o'clock retroareolar using a lateral to medial approach. At the conclusion of the procedure a ribbon shaped tissue marker clip was deployed into the biopsy cavity. Follow up 2 view mammogram was performed and dictated separately. SITE 2: RIGHT BREAST 6 O'CLOCK 2 CM FROM NIPPLE: MASS: Lesion quadrant: LOWER OUTER Using sterile technique and 1% Lidocaine as local anesthetic, under direct ultrasound visualization, a 14 gauge spring-loaded device was used to perform biopsy of the mass in the right breast at 6 o'clock 2 cm from nipple using a lateral to medial approach. At the conclusion of the procedure a wing shaped tissue marker clip was deployed into the biopsy cavity. Follow up 2 view mammogram was performed and dictated separately. IMPRESSION: 1. Ultrasound-guided biopsy of the mass in the retroareolar right breast, at site of ribbon shaped biopsy marking clip. 2. Ultrasound-guided biopsy of the mass in the right breast at 6 o'clock 2 cm from nipple, at site of wing shaped biopsy marking. Electronically Signed: By: Everlean Alstrom M.D. On: 04/26/2022 08:59   [place] RT BREAST BX  W LOC DEV EA ADD LESION IMG BX SPEC US GUIDE  Addendum Date: 05/03/2022   ADDENDUM REPORT: 05/03/2022 07:38 ADDENDUM: Pathology revealed MARKED DUCT ECTASIA WITH FOCAL PAPILLARY EPITHELIAL HYPERPLASIA WITHOUT CYTOLOGIC ATYPIA-NEGATIVE FOR MALIGNANCY of the RIGHT breast, 6 o'clock, subareolar (ribbon clip). This was found to be discordant by Dr. Everlean Alstrom, with excision recommended. 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 Linear extent of carcinoma in the longest core: 16 mm. This was found to be concordant by Dr. Everlean Alstrom. COMMENT: The submitted specimen does not have features of invasion. However, possibility of invasive carcinoma cannot be excluded until the entire lesion is examined. Pathology results were discussed with the patient by telephone on 04/28/2022. The patient reported doing well after the biopsies with tenderness at the sites. Post biopsy instructions and care were reviewed and questions were answered. The patient was encouraged to call [facility] for any additional concerns. Appointment was rescheduled with Dr. Curlene Labrum at [facility] for May 03, 2022. Pathology results reported by Stacie Acres RN on 04/29/2022. Electronically Signed   By: Everlean Alstrom M.D.   On: 05/03/2022 07:38   Result Date: 05/03/2022 CLINICAL DATA:  81 year old female presents for ultrasound-guided core biopsy of  a 0.5 cm mass in the retroareolar right breast as well as ultrasound-guided core biopsy of a 2.4 cm mass in the right breast at 6 o'clock 2 cm from nipple. EXAM: ULTRASOUND GUIDED RIGHT BREAST CORE NEEDLE BIOPSY COMPARISON:  Previous exam(s). PROCEDURE: I met with the patient and we discussed the procedure of ultrasound-guided biopsy, including benefits and alternatives. We discussed the high likelihood of a successful procedure. We discussed the risks of the  procedure, including infection, bleeding, tissue injury, clip migration, and inadequate sampling. Informed written consent was given. The usual time-out protocol was performed immediately prior to the procedure. SITE 1: RIGHT BREAST RETROAREOLAR: INTRADUCTAL MASS: Lesion quadrant: LOWER OUTER Using sterile technique and 1% Lidocaine as local anesthetic, under direct ultrasound visualization, a 14 gauge spring-loaded device was used to perform biopsy of the mass in the right breast at 6 o'clock retroareolar using a lateral to medial approach. At the conclusion of the procedure a ribbon shaped tissue marker clip was deployed into the biopsy cavity. Follow up 2 view mammogram was performed and dictated separately. SITE 2: RIGHT BREAST 6 O'CLOCK 2 CM FROM NIPPLE: MASS: Lesion quadrant: LOWER OUTER Using sterile technique and 1% Lidocaine as local anesthetic, under direct ultrasound visualization, a 14 gauge spring-loaded device was used to perform biopsy of the mass in the right breast at 6 o'clock 2 cm from nipple using a lateral to medial approach. At the conclusion of the procedure a wing shaped tissue marker clip was deployed into the biopsy cavity. Follow up 2 view mammogram was performed and dictated separately. IMPRESSION: 1. Ultrasound-guided biopsy of the mass in the retroareolar right breast, at site of ribbon shaped biopsy marking clip. 2. Ultrasound-guided biopsy of the mass in the right breast at 6 o'clock 2 cm from nipple, at site of wing shaped biopsy marking. Electronically Signed: By: Everlean Alstrom M.D. On: 04/26/2022 08:59     ASSESSMENT:  Right breast DCIS: - Mammogram (04/21/2022): Highly suspicious 2.4 cm mass in the right breast at 6:00.  0.6 cm mass and a dilated duct in the lateral areola right breast at 6:00.  No evidence of axillary adenopathy.  No evidence of left breast malignancy. - Biopsy (04/26/2022): Right breast subareolar biopsy with marked duct ectasia with focal papillary  epithelial hyperplasia without cytological atypia.  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  Grade 1.  No evidence of invasion.  ER 100%, PR 100%, Ki-67 10%, HER2 2+ by IHC, negative by FISH.   Social/family history: - She lives at home with her daughter.  Uses rollator for ambulation secondary to arthritis.  She is able to do all her ADLs.  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  Non-smoker. - Sister had metastatic breast cancer in her 29s.   PLAN:  Right breast DCIS, ER/PR positive: - We have discussed mammogram and pathology reports in detail. - Recommend proceeding with lumpectomy.  May omit SLNB given advanced age. - We will obtain bone density test and vitamin D level. - RTC 4 weeks after surgery.    Derek Jack, MD 05/16/22 2:03 PM  [place] [phone]   I, Thana Ates, am acting as a scribe for Dr. Derek Jack.  I, Derek Jack MD, have reviewed the above documentation for accuracy and completeness, and I agree with the above.

## 2022-05-16 NOTE — Patient Instructions (Addendum)
[place] at [facility] Discharge Instructions  You were seen and examined today by Dr. Delton Coombes. Dr. Delton Coombes is a medical oncologist, meaning that he specializes in the treatment of cancer diagnoses. Dr. Delton Coombes discussed your past medical history, family history of cancers, and the events that led to you being here today.  You were referred to Dr. Delton Coombes by Dr. 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 This is a pre-cancerous condition that places you at a higher risk for developing breast cancer. It is estrogen receptor and progesterone receptor positive. It is possible that there is invasive cancer behind the DCIS.  It is recommended that you undergo lumpectomy. This is the only way to successfully cure and prevent it from becoming breast cancer. Please follow-up with Dr. Constance Haw regarding surgery.  Following lumpectomy, you will be started on an antiestrogen pill. This will prevent the cancer from recurring.  Prior to starting antiestrogen therapy, you should have a bone density scan and a vitamin d level checked.  Please follow-up as scheduled.   Thank you for choosing [place] at [facility] to provide your oncology and hematology care.  To afford each patient quality time with our provider, please arrive at least 15 minutes before your scheduled appointment time.   If you have a lab appointment with the [place] please come in thru the Main Entrance and check in at the main information desk.  You need to re-schedule your appointment should you arrive 10 or more minutes late.  We strive to give you quality time with our providers, and arriving late affects you and other patients whose appointments are after yours.  Also, if you no show three or more times for appointments you may be dismissed from the clinic at the providers discretion.     Again, thank you for choosing [facility].  Our hope is that these requests will decrease the amount of time that you wait before being seen by our physicians.       _____________________________________________________________  Should you have questions after your visit to [facility], please contact our office at [phone] and follow the prompts.  Our office hours are 8:00 a.m. and 4:30 p.m. Monday - Friday.  Please note that voicemails left after 4:00 p.m. may not be returned until the following business day.  We are closed weekends and major holidays.  You do have access to a nurse 24-7, just call the main number to the clinic [phone] and do not press any options, hold on the line and a nurse will answer the phone.    For prescription refill requests, have your pharmacy contact our office and allow 72 hours.    Due to Covid, you will need to wear a mask upon entering the hospital. If you do not have a mask, a mask will be given to you at the Main Entrance upon arrival. For doctor visits, patients may have 1 support person age 104 or older with them. For treatment visits, patients can not have anyone with them due to social distancing guidelines and our immunocompromised population.

## 2022-05-18 ENCOUNTER — Ambulatory Visit (INDEPENDENT_AMBULATORY_CARE_PROVIDER_SITE_OTHER): Payer: Medicare HMO

## 2022-05-18 DIAGNOSIS — I442 Atrioventricular block, complete: Secondary | ICD-10-CM | POA: Diagnosis not present

## 2022-05-18 LAB — CUP PACEART REMOTE DEVICE CHECK
Battery Remaining Longevity: 49 mo
Battery Remaining Percentage: 44 %
Battery Voltage: 2.98 V
Brady Statistic AP VP Percent: 18 %
Brady Statistic AP VS Percent: 12 %
Brady Statistic AS VP Percent: 47 %
Brady Statistic AS VS Percent: 22 %
Brady Statistic RA Percent Paced: 29 %
Brady Statistic RV Percent Paced: 65 %
Date Time Interrogation Session: 20230524020018
Implantable Lead Implant Date: 20161129
Implantable Lead Implant Date: 20161129
Implantable Lead Location: 753859
Implantable Lead Location: 753860
Implantable Lead Model: 5076
Implantable Pulse Generator Implant Date: 20161129
Lead Channel Impedance Value: 310 Ohm
Lead Channel Impedance Value: 390 Ohm
Lead Channel Pacing Threshold Amplitude: 0.75 V
Lead Channel Pacing Threshold Amplitude: 1 V
Lead Channel Pacing Threshold Pulse Width: 0.4 ms
Lead Channel Pacing Threshold Pulse Width: 0.4 ms
Lead Channel Sensing Intrinsic Amplitude: 12 mV
Lead Channel Sensing Intrinsic Amplitude: 2.6 mV
Lead Channel Setting Pacing Amplitude: 2 V
Lead Channel Setting Pacing Amplitude: 2 V
Lead Channel Setting Pacing Pulse Width: 0.4 ms
Lead Channel Setting Sensing Sensitivity: 2 mV
Pulse Gen Model: 2240
Pulse Gen Serial Number: 7826037

## 2022-05-24 ENCOUNTER — Other Ambulatory Visit: Payer: Self-pay | Admitting: Adult Health

## 2022-05-25 ENCOUNTER — Ambulatory Visit (HOSPITAL_COMMUNITY)
Admission: RE | Admit: 2022-05-25 | Discharge: 2022-05-25 | Disposition: A | Discharge status: Home / Self Care | Payer: Medicare HMO | Source: Ambulatory Visit | Attending: Hematology | Admitting: Hematology

## 2022-05-25 ENCOUNTER — Inpatient Hospital Stay (HOSPITAL_COMMUNITY): Payer: Medicare HMO

## 2022-05-25 DIAGNOSIS — E559 Vitamin D deficiency, unspecified: Secondary | ICD-10-CM | POA: Diagnosis not present

## 2022-05-25 DIAGNOSIS — D0581 Other specified type of carcinoma in situ of right breast: Secondary | ICD-10-CM | POA: Diagnosis not present

## 2022-05-25 DIAGNOSIS — Z780 Asymptomatic menopausal state: Secondary | ICD-10-CM | POA: Diagnosis not present

## 2022-05-25 DIAGNOSIS — Z7984 Long term (current) use of oral hypoglycemic drugs: Secondary | ICD-10-CM | POA: Diagnosis not present

## 2022-05-25 DIAGNOSIS — Z13820 Encounter for screening for osteoporosis: Secondary | ICD-10-CM | POA: Diagnosis not present

## 2022-05-25 DIAGNOSIS — Z86711 Personal history of pulmonary embolism: Secondary | ICD-10-CM | POA: Diagnosis not present

## 2022-05-25 DIAGNOSIS — R5383 Other fatigue: Secondary | ICD-10-CM | POA: Diagnosis not present

## 2022-05-25 DIAGNOSIS — Z79899 Other long term (current) drug therapy: Secondary | ICD-10-CM | POA: Diagnosis not present

## 2022-05-25 DIAGNOSIS — Z794 Long term (current) use of insulin: Secondary | ICD-10-CM | POA: Insufficient documentation

## 2022-05-25 DIAGNOSIS — D0511 Intraductal carcinoma in situ of right breast: Secondary | ICD-10-CM | POA: Diagnosis not present

## 2022-05-25 DIAGNOSIS — Z7901 Long term (current) use of anticoagulants: Secondary | ICD-10-CM | POA: Diagnosis not present

## 2022-05-25 DIAGNOSIS — E119 Type 2 diabetes mellitus without complications: Secondary | ICD-10-CM | POA: Diagnosis not present

## 2022-05-25 LAB — CBC WITH DIFFERENTIAL/PLATELET
Abs Immature Granulocytes: 0.01 10*3/uL (ref 0.00–0.07)
Basophils Absolute: 0 10*3/uL (ref 0.0–0.1)
Basophils Relative: 1 %
Eosinophils Absolute: 0.1 10*3/uL (ref 0.0–0.5)
Eosinophils Relative: 2 %
HCT: 34.1 % — ABNORMAL LOW (ref 36.0–46.0)
Hemoglobin: 10.4 g/dL — ABNORMAL LOW (ref 12.0–15.0)
Immature Granulocytes: 0 %
Lymphocytes Relative: 33 %
Lymphs Abs: 2.1 10*3/uL (ref 0.7–4.0)
MCH: 28.4 pg (ref 26.0–34.0)
MCHC: 30.5 g/dL (ref 30.0–36.0)
MCV: 93.2 fL (ref 80.0–100.0)
Monocytes Absolute: 0.5 10*3/uL (ref 0.1–1.0)
Monocytes Relative: 8 %
Neutro Abs: 3.4 10*3/uL (ref 1.7–7.7)
Neutrophils Relative %: 56 %
Platelets: 198 10*3/uL (ref 150–400)
RBC: 3.66 MIL/uL — ABNORMAL LOW (ref 3.87–5.11)
RDW: 15.3 % (ref 11.5–15.5)
WBC: 6.1 10*3/uL (ref 4.0–10.5)
nRBC: 0 % (ref 0.0–0.2)

## 2022-05-25 LAB — COMPREHENSIVE METABOLIC PANEL
ALT: 14 U/L (ref 0–44)
AST: 17 U/L (ref 15–41)
Albumin: 3.1 g/dL — ABNORMAL LOW (ref 3.5–5.0)
Alkaline Phosphatase: 59 U/L (ref 38–126)
Anion gap: 4 — ABNORMAL LOW (ref 5–15)
BUN: 15 mg/dL (ref 8–23)
CO2: 26 mmol/L (ref 22–32)
Calcium: 8.3 mg/dL — ABNORMAL LOW (ref 8.9–10.3)
Chloride: 107 mmol/L (ref 98–111)
Creatinine, Ser: 0.77 mg/dL (ref 0.44–1.00)
GFR, Estimated: >60 mL/min (ref >60)
Glucose, Bld: 94 mg/dL (ref 70–99)
Potassium: 3.4 mmol/L — ABNORMAL LOW (ref 3.5–5.1)
Sodium: 137 mmol/L (ref 135–145)
Total Bilirubin: 0.9 mg/dL (ref 0.3–1.2)
Total Protein: 7 g/dL (ref 6.5–8.1)

## 2022-05-25 LAB — VITAMIN D 25 HYDROXY (VIT D DEFICIENCY, FRACTURES): Vit D, 25-Hydroxy: 11.13 ng/mL — ABNORMAL LOW (ref 30–100)

## 2022-05-26 ENCOUNTER — Ambulatory Visit (INDEPENDENT_AMBULATORY_CARE_PROVIDER_SITE_OTHER): Payer: Medicare HMO | Admitting: General Surgery

## 2022-05-26 ENCOUNTER — Encounter: Payer: Self-pay | Admitting: General Surgery

## 2022-05-26 VITALS — BP 147/77 | HR 65 | Temp 98.2°F | Resp 14 | Ht 64.0 in | Wt 194.0 lb

## 2022-05-26 DIAGNOSIS — D0581 Other specified type of carcinoma in situ of right breast: Secondary | ICD-10-CM

## 2022-05-26 NOTE — Patient Instructions (Addendum)
Will need to get Tag Placement and then my surgery after that. I will talk to Dr. Lovena Le about the Shortness of breath.   Someone will call on Monday.   Surgical Options for Early-Stage Breast Cancer Surgery is usually the first treatment for early-stage breast cancer. There are two surgery options that result in good rates of cancer survival. They include: Partial mastectomy. Mastectomy. Breast cancer is different for everyone, even in its early stage. The best treatment for one person might not be the best treatment for another. Learn as much as you can about your cancer, and work closely with your health care providers to make the choice that produces the best results for you. What is partial mastectomy? Partial mastectomy, also called breast-sparing surgery or breast-conserving surgery, is surgery to remove the cancer along with some normal breast tissue that surrounds it. Lymph nodes from under the arm may also be removed and tested to find out if the cancer has spread. If cancer is located near the chest wall, part of the chest wall lining may also be removed. What is a mastectomy? A mastectomy is surgery to remove the cancer along with the entire breast tissue. There are several types of mastectomy: Simple or total mastectomy. In this surgery, the entire breast is removed, including breast tissue, nipple, areola, and skin around the breast. Some lymph nodes may also be removed from under the arm. If cancer is located near the chest wall, part of the chest wall lining may also be removed. Skin-sparing mastectomy. In this surgery, the breast tissue, nipple, and areola are removed, and most of the skin over the breast is left in place. This surgery results in less scar tissue than other mastectomy surgeries, which allows for a more natural breast reconstruction. Nipple-sparing mastectomy. In this surgery, breast tissue is removed, but the skin and nipple are left in place. The tissue under the  nipple and areola may be removed if cancer is found in the area. This may be an option for those who choose to have breast reconstruction after mastectomy. Modified radical mastectomy. This surgery is the same as a simple mastectomy but also includes removing lymph nodes from under the arm (axillary lymph node dissection). Radical mastectomy. In this surgery, the entire breast, the lymph nodes under the arm, and the chest wall muscles under the breast are removed. This surgery is rarely done now. A modified radical mastectomy is preferred because it is just as effective but with the added advantage of fewer complications. What are some advantages and disadvantages of these surgeries? Partial mastectomy Advantages Keeping most of your breast tissue intact, allowing for a more natural look to the breast. Easier recovery when compared to a mastectomy. Ability to go home on the day of the procedure. Disadvantages Slightly higher risk that your cancer will come back. Needing more surgery at a later time. Requiring radiation therapy after surgery, which has side effects and possible complications. This is done to reduce the chances of breast cancer returning. Mastectomy Advantages Not needing to have radiation therapy or other treatments after surgery. Lower chances of your cancer coming back. Disadvantages Longer recovery time compared to partial mastectomy. Possibility of more complications. Requiring additional surgeries to reconstruct your breast. Questions to ask Here are some questions to ask about each surgery: What will my recovery be like? How will my breast look and feel? What are the possible risks and complications of the surgery? What additional treatment might I need after surgery? What are  the risks and complications of radiation therapy? What are the risks and complications of chemotherapy? Will I be able to have breast reconstruction? Where to find more information [place]: cancer.gov American Cancer Society: cancer.org Summary Surgery is usually the first treatment for early-stage breast cancer. Two surgery options are available. One surgery option is called partial mastectomy and the other is called mastectomy. Both result in good rates of cancer survival. Each option has advantages and disadvantages to consider. The best treatment for one person might not be the best treatment for you. Learn as much as you can about your cancer, and work closely with your health care providers to make the choice that produces the best results for you. This information is not intended to replace advice given to you by your health care provider. Make sure you discuss any questions you have with your health care provider. Document Revised: 11/24/2021 Document Reviewed: 11/24/2021 Elsevier Patient Education  [place].

## 2022-05-26 NOTE — Progress Notes (Signed)
[facility] History and Physical  Reason for Referral:*** Referring Physician: ***  Chief Complaint   Follow-up     Natalie Castro is a 81 y.o. female.  HPI: ***.  The *** started *** and has had a duration of ***.  It is associated with ***.  The *** is improved with ***, and is made worse with ***.    Quality*** Context***  Past Medical History:  Diagnosis Date  . Acid reflux   . Arthritis   . Colon adenomas    AGE 27  . Complete heart block (HCC)    STJ PPM Dr. [name] 11/24/15  . Essential hypertension   . 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 Hyperlipidemia   . Myoview 03/2021    Myoview 4/22: EF 50, no infarct or ischemia; low risk  . Presence of permanent cardiac pacemaker   . Type 2 diabetes mellitus ([place])     Past Surgical History:  Procedure Laterality Date  . CHOLECYSTECTOMY N/A 03/16/2022   Procedure: LAPAROSCOPIC CHOLECYSTECTOMY;  Surgeon: Virl Cagey, MD;  Location: [facility] ORS;  Service: General;  Laterality: N/A;  . COLONOSCOPY     3 SIMPLE ADENOMAS, AGE 36  . COLONOSCOPY N/A 05/24/2018   Procedure: COLONOSCOPY;  Surgeon: Danie Binder, MD;  Location: [facility] ENDO SUITE;  Service: Endoscopy;  Laterality: N/A;  1:00pm  . ENDOSCOPIC RETROGRADE CHOLANGIOPANCREATOGRAPHY (ERCP) WITH PROPOFOL N/A 03/15/2022   Procedure: ENDOSCOPIC RETROGRADE CHOLANGIOPANCREATOGRAPHY (ERCP) WITH PROPOFOL;  Surgeon: Rogene Houston, MD;  Location: [facility] ORS;  Service: Endoscopy;  Laterality: N/A;  with sphincterotomy and stone retrieval  . EP IMPLANTABLE DEVICE N/A 11/24/2015   Procedure: Pacemaker Implant;  Surgeon: [name], MD;  Location: [place] CV LAB;  Service: Cardiovascular;  Laterality: N/A;  . IR EXCHANGE BILIARY DRAIN  07/27/2021  . IR EXCHANGE BILIARY DRAIN  08/23/2021  . IR EXCHANGE BILIARY DRAIN  02/25/2022  . IR PERC CHOLECYSTOSTOMY  07/08/2021  . IR PERC CHOLECYSTOSTOMY  11/04/2021  . IR RADIOLOGIST  EVAL & MGMT  09/22/2021  . JOINT REPLACEMENT     knees bilat.  Marland Kitchen LEFT HEART CATH AND CORONARY ANGIOGRAPHY N/A 07/02/2021   Procedure: LEFT HEART CATH AND CORONARY ANGIOGRAPHY;  Surgeon: Jettie Booze, MD;  Location: [place] CV LAB;  Service: Cardiovascular;  Laterality: N/A;  . POLYPECTOMY  05/24/2018   Procedure: POLYPECTOMY;  Surgeon: Danie Binder, MD;  [facility];  Service: Endoscopy;;  ascending and hepatic flexure, transverse  . REMOVAL OF STONES N/A 03/15/2022   Procedure: STONE AND SLUDGE EXTRACTION;  Surgeon: Rogene Houston, MD;  Location: [facility];  Service: Endoscopy;  Laterality: N/A;  . TEE WITHOUT CARDIOVERSION N/A 08/27/2021   Procedure: TRANSESOPHAGEAL ECHOCARDIOGRAM (TEE);  Surgeon: Pixie Casino, MD;  Location: [facility] ENDOSCOPY;  Service: Cardiovascular;  Laterality: N/A;  . TUBAL LIGATION      Family History  Problem Relation Age of Onset  . Heart failure Mother   . Hypertension Mother   . Heart failure Sister   . Hypertension Father   . Cancer Sister   . Dementia Sister   . Colon cancer Neg Hx   . Colon polyps Neg Hx     Social History   Tobacco Use  . Smoking status: Never  . Smokeless tobacco: Never  Vaping Use  . Vaping Use: Never used  Substance Use Topics  . Alcohol use: No    Alcohol/week:  0.0 standard drinks  . Drug use: No    Medications: {medication reviewed/display:3041432} Allergies as of 05/26/2022       Reactions   Penicillins Hives, Itching, Other (See Comments)   Has tolerated ceftriaxone 7/22 Has patient had a PCN reaction causing immediate rash, facial/tongue/throat swelling, SOB or lightheadedness with hypotension: No Has patient had a PCN reaction causing severe rash involving mucus membranes or skin necrosis: Yes Has patient had a PCN reaction that required hospitalization No Has patient had a PCN reaction occurring within the last 10 years: No If all of the above answers are "NO", then may proceed with  Cephalosporin use.        Medication List        Accurate as of May 26, 2022  5:14 PM. If you have any questions, ask your nurse or doctor.          apixaban 5 MG Tabs tablet Commonly known as: Eliquis Take 1 tablet (5 mg total) by mouth 2 (two) times daily.   atorvastatin 40 MG tablet Commonly known as: LIPITOR Take 1 tablet (40 mg total) by mouth daily.   BD AutoShield Duo 30G X 5 MM Misc Generic drug: Insulin Pen Needle 30 gauge x 3/16" (pen needle,diabetic dual safty)   furosemide 40 MG tablet Commonly known as: LASIX Take 1 tablet (40 mg total) by mouth daily.   glucose blood test strip Use to check blood glucose fasting , before lunch and dinner and after your largest meal   hydrALAZINE 50 MG tablet Commonly known as: APRESOLINE   Lantus SoloStar 100 UNIT/ML Solostar Pen Generic drug: insulin glargine Inject 10 Units into the skin at bedtime.   losartan 100 MG tablet Commonly known as: COZAAR Take 100 mg by mouth daily. X 3 days (Starting 05/03/2022)   metFORMIN 1000 MG tablet Commonly known as: GLUCOPHAGE TAKE (1) TABLET BY MOUTH TWICE DAILY.   metoprolol succinate 50 MG 24 hr tablet Commonly known as: TOPROL-XL Take 1 tablet (50 mg total) by mouth daily. Take with or immediately following a meal.   NIFEdipine 30 MG 24 hr tablet Commonly known as: PROCARDIA-XL/NIFEDICAL-XL   nitroGLYCERIN 0.4 MG SL tablet Commonly known as: NITROSTAT Place 1 tablet (0.4 mg total) under the tongue every 5 (five) minutes as needed for chest pain.   onetouch ultrasoft lancets Use to check glucose 4 x daily   vitamin B-12 500 MCG tablet Commonly known as: CYANOCOBALAMIN Take 1 tablet (500 mcg total) by mouth daily.         ROS:  {Review of Systems:30496}  Blood pressure (!) 147/77, pulse 65, temperature 98.2 F (36.8 C), temperature source Oral, resp. rate 14, height '5\' 4"'$  (1.626 m), weight 194 lb (88 kg), SpO2 95 %. Physical Exam  Results: Results for  orders placed or performed in visit on 05/25/22 (from the past 48 hour(s))  Vitamin D 25 hydroxy     Status: Abnormal   Collection Time: 05/25/22  1:50 PM  Result Value Ref Range   Vit D, 25-Hydroxy 11.13 (L) 30 - 100 ng/mL    Comment: (NOTE) Vitamin D deficiency has been defined by the [place] practice guideline as a level of serum 25-OH  vitamin D less than 20 ng/mL (1,2). The Endocrine Society went on to  further define vitamin D insufficiency as a level between 21 and 29  ng/mL (2).  1. IOM (Institute of Medicine). 2010. Dietary reference intakes for  calcium and  D. [place]: The [employer]. 2. Holick MF, Binkley Linden, Bischoff-Ferrari HA, et al. Evaluation,  treatment, and prevention of vitamin D deficiency: an Endocrine  Society clinical practice guideline, JCEM. 2011 Jul; 96(7): 1911-30.  Performed at [facility], [place] [address] [place], [place] [address]   CBC with Differential     Status: Abnormal   Collection Time: 05/25/22  1:50 PM  Result Value Ref Range   WBC 6.1 4.0 - 10.5 K/uL   RBC 3.66 (L) 3.87 - 5.11 MIL/uL   Hemoglobin 10.4 (L) 12.0 - 15.0 g/dL   HCT 34.1 (L) 36.0 - 46.0 %   MCV 93.2 80.0 - 100.0 fL   MCH 28.4 26.0 - 34.0 pg   MCHC 30.5 30.0 - 36.0 g/dL   RDW 15.3 11.5 - 15.5 %   Platelets 198 150 - 400 K/uL   nRBC 0.0 0.0 - 0.2 %   Neutrophils Relative % 56 %   Neutro Abs 3.4 1.7 - 7.7 K/uL   Lymphocytes Relative 33 %   Lymphs Abs 2.1 0.7 - 4.0 K/uL   Monocytes Relative 8 %   Monocytes Absolute 0.5 0.1 - 1.0 K/uL   Eosinophils Relative 2 %   Eosinophils Absolute 0.1 0.0 - 0.5 K/uL   Basophils Relative 1 %   Basophils Absolute 0.0 0.0 - 0.1 K/uL   Immature Granulocytes 0 %   Abs Immature Granulocytes 0.01 0.00 - 0.07 K/uL    Comment: Performed at [facility], [address], [place], [place] [address]  Comprehensive metabolic panel     Status: Abnormal   Collection Time: 05/25/22  1:50  PM  Result Value Ref Range   Sodium 137 135 - 145 mmol/L   Potassium 3.4 (L) 3.5 - 5.1 mmol/L   Chloride 107 98 - 111 mmol/L   CO2 26 22 - 32 mmol/L   Glucose, Bld 94 70 - 99 mg/dL    Comment: Glucose reference range applies only to samples taken after fasting for at least 8 hours.   BUN 15 8 - 23 mg/dL   Creatinine, Ser 0.77 0.44 - 1.00 mg/dL   Calcium 8.3 (L) 8.9 - 10.3 mg/dL   Total Protein 7.0 6.5 - 8.1 g/dL   Albumin 3.1 (L) 3.5 - 5.0 g/dL   AST 17 15 - 41 U/L   ALT 14 0 - 44 U/L   Alkaline Phosphatase 59 38 - 126 U/L   Total Bilirubin 0.9 0.3 - 1.2 mg/dL   GFR, Estimated >60 >60 mL/min    Comment: (NOTE) Calculated using the CKD-EPI Creatinine Equation (2021)    Anion gap 4 (L) 5 - 15    Comment: Performed at [facility], [address] [place], [place] [address]    DG Bone Density  Result Date: 05/25/2022 EXAM: DUAL X-RAY ABSORPTIOMETRY (DXA) FOR BONE MINERAL DENSITY IMPRESSION: Your patient Keyona Emrich completed a BMD test on 05/25/2022 using the Flasher (software version: 14.10) manufactured by [employer]. The following summarizes the results of our evaluation. Technologist: [facility] PATIENT BIOGRAPHICAL: Name: Aby, Gessel Patient ID:  [id number] Birth Date: [birth year] Height:     64.0 in. Gender:      Female Exam Date:  05/25/2022 Weight:     201.6 lbs. INDICATIONS: Advanced Age, Diabetic-insulin dependent, Height Loss, Low Calcium Intake, Post Menopausal Fractures: Treatments DENSITOMETRY RESULTS: Site      Region     Measured Date Measured Age WHO Classification Young Adult  T-score BMD         %Change vs. Previous Significant Change (*) [facility] Spine L1-L2 05/25/2022 80.2 Normal 0.6 1.239 g/cm2 - - DualFemur Neck Right 05/25/2022 80.2 Normal -0.7 0.937 g/cm2 -10.1% Yes DualFemur Neck Right 01/18/2017 74.8 Normal 0.0 1.042 g/cm2 - - DualFemur Total Mean 05/25/2022 80.2 Normal -0.6 0.937 g/cm2 -10.3% Yes DualFemur Total Mean 01/18/2017 74.8 Normal 0.3 1.045  g/cm2 - - ASSESSMENT: The BMD measured at Femur Neck Right is 0.937 g/cm2 with a T-score of -0.7. This patient is considered normal according to [place] [facility]) criteria. The scan quality is good. Compared with the prior study on 01/18/2017 , the BMD of the total mean shows a statistically significant decrease. L3 and L4 were excluded due to advanced degenerative changes. World Pharmacologist [facility]) criteria for post-menopausal, Caucasian Women: Normal:       T-score at or above -1 SD Osteopenia:   T-score between -1 and -2.5 SD Osteoporosis: T-score at or below -2.5 SD RECOMMENDATIONS: 1. All patients should optimize calcium and vitamin D intake. 2. Consider FDA-approved medical therapies in postmenopausal women and med aged 44 years and older, based on the following: a. A hip or vertebral (clinical or morphometric) fracture b. T-score < -2.5 at the femoral neck or spine after appropriate evaluation to exclude secondary causes c. Low bone mass (T-score between -1.0 and -2.5 at the femoral neck or spine) and a 10-year probability of a hip fracture > 3% or a 10-year probability of a major osteoporosis-related fracture > 20% based on the [place]-adapted WHO algorithm d. Clinician judgment and/or patient preferences may indicate treatment for people with 10-year fracture probabilities above or below these levels FOLLOW-UP: Patients with diagnosis of osteoporosis or at high risk for fracture should have regular bone mineral density tests. For patients eligible for Medicare, routine testing is allowed once every 2 years. The testing frequency can be increased to one year for patients who have rapidly progressing disease, those who are receiving or discontinuing medical therapy to restore bone mass, or have additional risk factors. I have reviewed this report, and agree with the above findings. Mark A. Thornton Papas, M.D. [facility] Electronically Signed   By: Lavonia Dana M.D.   On: 05/25/2022 14:12      Assessment & Plan:  GALIT URICH is a 81 y.o. female with *** -*** -*** -Follow up ***  All questions were answered to the satisfaction of the patient and family***.  The risk and benefits of *** were discussed including but not limited to ***.  After careful consideration, VALLERIE HENTZ has decided to ***.    Virl Cagey 05/26/2022, 5:14 PM

## 2022-05-30 ENCOUNTER — Other Ambulatory Visit (HOSPITAL_COMMUNITY): Payer: Self-pay | Admitting: Radiology

## 2022-05-30 ENCOUNTER — Other Ambulatory Visit (HOSPITAL_COMMUNITY): Payer: Self-pay | Admitting: General Surgery

## 2022-05-30 DIAGNOSIS — D0511 Intraductal carcinoma in situ of right breast: Secondary | ICD-10-CM

## 2022-05-30 DIAGNOSIS — R928 Other abnormal and inconclusive findings on diagnostic imaging of breast: Secondary | ICD-10-CM

## 2022-05-31 NOTE — Progress Notes (Signed)
Remote pacemaker transmission.   

## 2022-06-01 NOTE — H&P (Signed)
[facility] History and Physical     Chief Complaint   Follow-up        Natalie Castro is a 81 y.o. female.  HPI: Natalie Castro is known to me and has a diagnosis of R breast papillary DCIS. She had wanted to go speak with Oncology regarding non-surgical options before proceeding with surgery. She had gone through a laparoscopic cholecystectomy in March and has been doing fairly well since that time.   She saw Dr. Delton Coombes who agrees that she should undergo lumpectomy for the DCIS and the discordant area in the subareolar area. He also agreed that given her age she could forgo sentinel node.       Past Medical History:  Diagnosis Date   Acid reflux     Arthritis     Colon adenomas      AGE 81   Complete heart block (HCC)      STJ PPM Dr. [name] 11/24/15   Essential hypertension     History of pulmonary embolism 2017    unprovoked, long term anticoag with apixaban    Hyperlipidemia     Myoview 03/2021      Myoview 4/22: EF 50, no infarct or ischemia; low risk   Presence of permanent cardiac pacemaker     Type 2 diabetes mellitus ([place])             Past Surgical History:  Procedure Laterality Date   CHOLECYSTECTOMY N/A 03/16/2022    Procedure: LAPAROSCOPIC CHOLECYSTECTOMY;  Surgeon: Virl Cagey, MD;  Location: [facility];  Service: General;  Laterality: N/A;   COLONOSCOPY        3 SIMPLE ADENOMAS, AGE 33   COLONOSCOPY N/A 05/24/2018    Procedure: COLONOSCOPY;  Surgeon: Danie Binder, MD;  Location: [facility];  Service: Endoscopy;  Laterality: N/A;  1:00pm   ENDOSCOPIC RETROGRADE CHOLANGIOPANCREATOGRAPHY (ERCP) WITH PROPOFOL N/A 03/15/2022    Procedure: ENDOSCOPIC RETROGRADE CHOLANGIOPANCREATOGRAPHY (ERCP) WITH PROPOFOL;  Surgeon: Rogene Houston, MD;  Location: [facility];  Service: Endoscopy;  Laterality: N/A;  with sphincterotomy and stone retrieval   EP IMPLANTABLE DEVICE N/A 11/24/2015    Procedure: Pacemaker Implant;  Surgeon: [name], MD;  Location:  [place] [facility];  Service: Cardiovascular;  Laterality: N/A;   IR EXCHANGE BILIARY DRAIN   07/27/2021   IR EXCHANGE BILIARY DRAIN   08/23/2021   IR EXCHANGE BILIARY DRAIN   02/25/2022   IR PERC CHOLECYSTOSTOMY   07/08/2021   IR PERC CHOLECYSTOSTOMY   11/04/2021   IR RADIOLOGIST EVAL & MGMT   09/22/2021   JOINT REPLACEMENT        knees bilat.   LEFT HEART CATH AND CORONARY ANGIOGRAPHY N/A 07/02/2021    Procedure: LEFT HEART CATH AND CORONARY ANGIOGRAPHY;  Surgeon: Jettie Booze, MD;  Location: [place] [facility];  Service: Cardiovascular;  Laterality: N/A;   POLYPECTOMY   05/24/2018    Procedure: POLYPECTOMY;  Surgeon: Danie Binder, MD;  [facility];  Service: Endoscopy;;  ascending and hepatic flexure, transverse   REMOVAL OF STONES N/A 03/15/2022    Procedure: STONE AND SLUDGE EXTRACTION;  Surgeon: Rogene Houston, MD;  Location: [facility];  Service: Endoscopy;  Laterality: N/A;   TEE WITHOUT CARDIOVERSION N/A 08/27/2021    Procedure: TRANSESOPHAGEAL ECHOCARDIOGRAM (TEE);  Surgeon: Pixie Casino, MD;  Location: [place];  Service: Cardiovascular;  Laterality: N/A;   TUBAL LIGATION  Family History  Problem Relation Age of Onset   Heart failure Mother     Hypertension Mother     Heart failure Sister     Hypertension Father     Cancer Sister     Dementia Sister     Colon cancer Neg Hx     Colon polyps Neg Hx        Social History         Tobacco Use   Smoking status: Never   Smokeless tobacco: Never  Vaping Use   Vaping Use: Never used  Substance Use Topics   Alcohol use: No      Alcohol/week: 0.0 standard drinks   Drug use: No      Medications: I have reviewed the patient's current medications. Allergies as of 05/26/2022         Reactions    Penicillins Hives, Itching, Other (See Comments)    Has tolerated ceftriaxone 7/22 Has patient had a PCN reaction causing immediate rash, facial/tongue/throat swelling, SOB or lightheadedness with  hypotension: No Has patient had a PCN reaction causing severe rash involving mucus membranes or skin necrosis: Yes Has patient had a PCN reaction that required hospitalization No Has patient had a PCN reaction occurring within the last 10 years: No If all of the above answers are "NO", then may proceed with Cephalosporin use.            Medication List           Accurate as of May 26, 2022  5:14 PM. If you have any questions, ask your nurse or doctor.              apixaban 5 MG Tabs tablet Commonly known as: Eliquis Take 1 tablet (5 mg total) by mouth 2 (two) times daily.    atorvastatin 40 MG tablet Commonly known as: LIPITOR Take 1 tablet (40 mg total) by mouth daily.    BD AutoShield Duo 30G X 5 MM Misc Generic drug: Insulin Pen Needle 30 gauge x 3/16" (pen needle,diabetic dual safty)    furosemide 40 MG tablet Commonly known as: LASIX Take 1 tablet (40 mg total) by mouth daily.    glucose blood test strip Use to check blood glucose fasting , before lunch and dinner and after your largest meal    hydrALAZINE 50 MG tablet Commonly known as: APRESOLINE    Lantus SoloStar 100 UNIT/ML Solostar Pen Generic drug: insulin glargine Inject 10 Units into the skin at bedtime.    losartan 100 MG tablet Commonly known as: COZAAR Take 100 mg by mouth daily. X 3 days (Starting 05/03/2022)    metFORMIN 1000 MG tablet Commonly known as: GLUCOPHAGE TAKE (1) TABLET BY MOUTH TWICE DAILY.    metoprolol succinate 50 MG 24 hr tablet Commonly known as: TOPROL-XL Take 1 tablet (50 mg total) by mouth daily. Take with or immediately following a meal.    NIFEdipine 30 MG 24 hr tablet Commonly known as: PROCARDIA-XL/NIFEDICAL-XL    nitroGLYCERIN 0.4 MG SL tablet Commonly known as: NITROSTAT Place 1 tablet (0.4 mg total) under the tongue every 5 (five) minutes as needed for chest pain.    onetouch ultrasoft lancets Use to check glucose 4 x daily    vitamin B-12 500 MCG  tablet Commonly known as: CYANOCOBALAMIN Take 1 tablet (500 mcg total) by mouth daily.               ROS:  A comprehensive review of systems was negative  except for: Integument/breast: positive for right breast dcis   Blood pressure (!) 147/77, pulse 65, temperature 98.2 F (36.8 C), temperature source Oral, resp. rate 14, height _0  (1.626 m), weight 194 lb (88 kg), SpO2 95 %. Physical Exam Vitals reviewed.  Constitutional:      Appearance: Normal appearance.  HENT:     Head: Atraumatic.     Nose: Nose normal.  Eyes:     Extraocular Movements: Extraocular movements intact.  Cardiovascular:     Rate and Rhythm: Normal rate and regular rhythm.  Pulmonary:     Effort: Pulmonary effort is normal.     Breath sounds: Normal breath sounds.  Chest:     Comments: Deferred breast exam given prior exams  Abdominal:     General: There is no distension.     Palpations: Abdomen is soft.  Musculoskeletal:        General: Normal range of motion.     Cervical back: Normal range of motion.  Skin:    General: Skin is warm.  Neurological:     General: No focal deficit present.     Mental Status: She is alert.      Results: Lab Results Last 48 Hours        Results for orders placed or performed in visit on 05/25/22 (from the past 48 hour(s))  Vitamin D 25 hydroxy     Status: Abnormal    Collection Time: 05/25/22  1:50 PM  Result Value Ref Range    Vit D, 25-Hydroxy 11.13 (L) 30 - 100 ng/mL      Comment: (NOTE) Vitamin D deficiency has been defined by the [place] practice guideline as a level of serum 25-OH  vitamin D less than 20 ng/mL (1,2). The Endocrine Society went on to  further define vitamin D insufficiency as a level between 21 and 29  ng/mL (2).   1. IOM (Institute of Medicine). 2010. Dietary reference intakes for  calcium and D. [place]: The [employer]. 2. Holick MF, Binkley Jermyn, Bischoff-Ferrari HA, et al.  Evaluation,  treatment, and prevention of vitamin D deficiency: an Endocrine  Society clinical practice guideline, JCEM. 2011 Jul; 96(7): 1911-30.   Performed at [facility], [place] [address] [place],  [address]    CBC with Differential     Status: Abnormal    Collection Time: 05/25/22  1:50 PM  Result Value Ref Range    WBC 6.1 4.0 - 10.5 K/uL    RBC 3.66 (L) 3.87 - 5.11 MIL/uL    Hemoglobin 10.4 (L) 12.0 - 15.0 g/dL    HCT 34.1 (L) 36.0 - 46.0 %    MCV 93.2 80.0 - 100.0 fL    MCH 28.4 26.0 - 34.0 pg    MCHC 30.5 30.0 - 36.0 g/dL    RDW 15.3 11.5 - 15.5 %    Platelets 198 150 - 400 K/uL    nRBC 0.0 0.0 - 0.2 %    Neutrophils Relative % 56 %    Neutro Abs 3.4 1.7 - 7.7 K/uL    Lymphocytes Relative 33 %    Lymphs Abs 2.1 0.7 - 4.0 K/uL    Monocytes Relative 8 %    Monocytes Absolute 0.5 0.1 - 1.0 K/uL    Eosinophils Relative 2 %    Eosinophils Absolute 0.1 0.0 - 0.5 K/uL    Basophils Relative 1 %    Basophils Absolute 0.0 0.0 -  0.1 K/uL    Immature Granulocytes 0 %    Abs Immature Granulocytes 0.01 0.00 - 0.07 K/uL      Comment: Performed at [facility], [address], [place], [place] [address]  Comprehensive metabolic panel     Status: Abnormal    Collection Time: 05/25/22  1:50 PM  Result Value Ref Range    Sodium 137 135 - 145 mmol/L    Potassium 3.4 (L) 3.5 - 5.1 mmol/L    Chloride 107 98 - 111 mmol/L    CO2 26 22 - 32 mmol/L    Glucose, Bld 94 70 - 99 mg/dL      Comment: Glucose reference range applies only to samples taken after fasting for at least 8 hours.    BUN 15 8 - 23 mg/dL    Creatinine, Ser 0.77 0.44 - 1.00 mg/dL    Calcium 8.3 (L) 8.9 - 10.3 mg/dL    Total Protein 7.0 6.5 - 8.1 g/dL    Albumin 3.1 (L) 3.5 - 5.0 g/dL    AST 17 15 - 41 U/L    ALT 14 0 - 44 U/L    Alkaline Phosphatase 59 38 - 126 U/L    Total Bilirubin 0.9 0.3 - 1.2 mg/dL    GFR, Estimated >60 >60 mL/min      Comment: (NOTE) Calculated using the CKD-EPI Creatinine  Equation (2021)      Anion gap 4 (L) 5 - 15      Comment: Performed at [facility], [address] [place], [place] [address]      CLINICAL DATA:  81 year old female presenting for evaluation of a mass in the right breast identified on a recent CT of the abdomen and pelvis 03/13/2022. The patient states that she does have a palpable lump in the right breast.   EXAM: DIGITAL DIAGNOSTIC BILATERAL MAMMOGRAM WITH TOMOSYNTHESIS AND CAD; ULTRASOUND RIGHT BREAST LIMITED   TECHNIQUE: Bilateral digital diagnostic mammography and breast tomosynthesis was performed. The images were evaluated with computer-aided detection.; Targeted ultrasound examination of the right breast was performed   COMPARISON:  Previous exam(s).   ACR Breast Density Category b: There are scattered areas of fibroglandular density.   FINDINGS: In the slightly inferior far posterior right breast there is an irregular mass with associated coarse internal calcifications. The mass measures approximately 2.5 cm. There appears to be right nipple retraction, and new density extending between the nipple and the mass. The skin marker indicating the palpable lump pointed out by the patient lies just above the right nipple. There are no definite masses just inferior to this marker, however she may be palpating the mass which is deeper within the breast behind the skin marker. No suspicious calcifications, masses or areas of distortion are seen in the left breast.   There is a firm palpable mass in the central posterior right breast on my exam.   Ultrasound targeted to the right breast at 6 o'clock, 2 cm from the nipple demonstrates an irregular oval hypoechoic mass measuring 2.4 x 1.4 x 2.0 cm. There are dilated ducts extending between the mass and the base of the nipple. In one of the ducts at the base of the nipple at 6 o'clock, there is a small oval vascular hypoechoic mass measuring 0.6 x 0.4 x 0.5 cm.    Ultrasound of the right axilla demonstrates normal-appearing lymph nodes.   IMPRESSION: 1. There is a highly suspicious 2.4 cm mass in the right breast at 6 o'clock. This  corresponds in size and location with the mass seen on the patient's recent CT.   2. There is a 0.6 cm mass in a dilated duct in the retroareolar right breast at 6 o'clock. This may represent a papilloma versus a satellite lesion.   3.  No evidence of right axillary lymphadenopathy.   4.  No evidence of left breast malignancy.   RECOMMENDATION: Ultrasound guided biopsy is recommended for the 2.4 cm right breast mass, as well as the 0.6 cm mass in the retroareolar right breast. The patient will schedule the biopsy at her earliest convenience.   I have discussed the findings and recommendations with the patient. If applicable, a reminder letter will be sent to the patient regarding the next appointment.   BI-RADS CATEGORY  5: Highly suggestive of malignancy.     Electronically Signed   By: Ammie Ferrier M.D.   On: 04/21/2022 11:19   ULTRASOUND GUIDED RIGHT BREAST CORE NEEDLE BIOPSY   COMPARISON:  Previous exam(s).   PROCEDURE: I met with the patient and we discussed the procedure of ultrasound-guided biopsy, including benefits and alternatives. We discussed the high likelihood of a successful procedure. We discussed the risks of the procedure, including infection, bleeding, tissue injury, clip migration, and inadequate sampling. Informed written consent was given. The usual time-out protocol was performed immediately prior to the procedure.   SITE 1: RIGHT BREAST RETROAREOLAR: INTRADUCTAL MASS: Lesion quadrant: LOWER OUTER   Using sterile technique and 1% Lidocaine as local anesthetic, under direct ultrasound visualization, a 14 gauge spring-loaded device was used to perform biopsy of the mass in the right breast at 6 o'clock retroareolar using a lateral to medial approach. At the  conclusion of the procedure a ribbon shaped tissue marker clip was deployed into the biopsy cavity. Follow up 2 view mammogram was performed and dictated separately.   SITE 2: RIGHT BREAST 6 O'CLOCK 2 CM FROM NIPPLE: MASS: Lesion quadrant: LOWER OUTER   Using sterile technique and 1% Lidocaine as local anesthetic, under direct ultrasound visualization, a 14 gauge spring-loaded device was used to perform biopsy of the mass in the right breast at 6 o'clock 2 cm from nipple using a lateral to medial approach. At the conclusion of the procedure a wing shaped tissue marker clip was deployed into the biopsy cavity. Follow up 2 view mammogram was performed and dictated separately.   IMPRESSION: 1. Ultrasound-guided biopsy of the mass in the retroareolar right breast, at site of ribbon shaped biopsy marking clip.   2. Ultrasound-guided biopsy of the mass in the right breast at 6 o'clock 2 cm from nipple, at site of wing shaped biopsy marking.   Electronically Signed: By: Everlean Alstrom M.D. On: 04/26/2022 08:59 ADDENDUM REPORT: 05/03/2022 07:38   ADDENDUM: Pathology revealed MARKED DUCT ECTASIA WITH FOCAL PAPILLARY EPITHELIAL HYPERPLASIA WITHOUT CYTOLOGIC ATYPIA-NEGATIVE FOR MALIGNANCY of the RIGHT breast, 6 o'clock, subareolar (ribbon clip). This was found to be discordant by Dr. Everlean Alstrom, with excision recommended.   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 Linear extent of carcinoma in the longest core: 16 mm. This was found to be concordant by Dr. Everlean Alstrom.   COMMENT: The submitted specimen does not have features of invasion. However, possibility of invasive carcinoma cannot be excluded until the entire lesion is examined.   Pathology results were discussed with the patient by telephone on 04/28/2022. The patient reported doing well after the biopsies with tenderness at  the  sites. Post biopsy instructions and care were reviewed and questions were answered. The patient was encouraged to call [facility] for any additional concerns.   Appointment was rescheduled with Dr. Curlene Labrum at [facility] for May 03, 2022.   Pathology results reported by Stacie Acres RN on 04/29/2022.     Electronically Signed   By: Everlean Alstrom M.D.   On: 05/03/2022 07:38   CLINICAL DATA:  Post ultrasound-guided biopsy of an intraductal mass in the right breast at 6 o'clock retroareolar as well as a suspicious mass in the right breast at 6 o'clock 2 cm from nipple.   EXAM: 3D DIAGNOSTIC RIGHT MAMMOGRAM POST ULTRASOUND BIOPSY   COMPARISON:  Previous exam(s).   FINDINGS: 3D Mammographic images were obtained following ultrasound-guided biopsy of an intraductal mass in the right breast at 6 o'clock retroareolar as well as a suspicious mass in the right breast at 6 o'clock 2 cm from nipple. A ribbon shaped biopsy marking clip is present at the site of the biopsied intraductal mass in the right breast (at 6 o'clock) subareolar. A wing shaped biopsy marking clip is present at the site of the biopsied mass with associated calcifications in the right breast at 6 o'clock 2 cm from nipple.   IMPRESSION: 1. Ribbon shaped biopsy marking clip at site of biopsied mass in the retroareolar right breast.   2. Wing shaped biopsy marking clip at site of biopsied mass in the right breast at 6 o'clock 2 cm from.   Final Assessment: Post Procedure Mammograms for Marker Placement     Electronically Signed   By: Everlean Alstrom M.D.   On: 04/26/2022 08:53     FINAL MICROSCOPIC DIAGNOSIS:   A. BREAST, SUBAREOLAR, RIGHT, BIOPSY:  Marked duct ectasia with focal papillary epithelial hyperplasia without  cytologic atypia.  Negative for malignancy.   B. BREAST, 6:00, RIGHT, BIOPSY:  Intraductal / intracystic papillary carcinoma.   Linear extent of carcinoma in the longest core: 16 mm.  Nottingham score: 2+2+1, Grade 1.  Results of prognostic markers (ER, PR, Ki-67 and HER-2/neu) will be  reported as an addendum.  Please see comment.   Comment: The submitted specimen does not have features of invasion.  However, possibility of invasive carcinoma cannot be excluded until the  entire lesion is examined.  This case was reviewed by Dr. Thressa Sheller who concurs with the above  interpretation.   The following immunostains are performed on specimen B with appropriate  controls:  CK5/6: Negative.  ER: Diffusely strongly positive.  Calponin: Negative.  P63: Negative.    GROSS DESCRIPTION:   A.  Received in formalin are 3 cores of fibroadipose tissue ranging from  1.0 to 1.5 cm in length, each measuring 0.1 cm in diameter.  The  specimen is entirely submitted in 1 block.  TIF: 820, CIT: <1-minute   B.  Received in formalin are 4 cores of fibroadipose tissue ranging from  1.0 to 1.6 cm in length, each measuring 0.1 cm in diameter.  The  specimen is entirely submitted in 1 block.  TIF: 835, CIT: <1-minute  (KW, 04/26/2022)    Final Diagnosis performed by Unknown Jim, MD.   Electronically signed  04/28/2022  Technical component performed at [facility], [place]  [address] [place], [place] [address].   Professional component performed at [employer]. [facility],  [place] [address] [place], [place] [address].   Immunohistochemistry Technical component (if applicable)  was performed  at [facility]. [address] [place],  [place], [place] [address].   IMMUNOHISTOCHEMISTRY DISCLAIMER (if applicable):  Some of these immunohistochemical stains may have been developed and the  performance characteristics determine by [facility]. Some  may not have been cleared or approved by the U.S. Food and Drug  Administration. The FDA has determined that such clearance or  approval  is not necessary. This test is used for clinical purposes. It should not  be regarded as investigational or for research. This laboratory is  certified under the [place]  (CLIA-88) as qualified to perform high complexity clinical laboratory  testing.  The controls stained appropriately.   ADDENDUM:   PROGNOSTIC INDICATOR RESULTS:   Immunohistochemical and morphometric analysis performed manually   The tumor cells are equivocal for Her2 (2+).  Her2 by FISH will be performed and the results will be reported  separately.   Estrogen Receptor:       Positive, 100%, strong staining  Progesterone Receptor:   Positive, 100%, strong staining  Proliferation Marker Ki-67:   10%   Comment: The negative hormone receptor study(ies) in the case has an  internal positive control.   Reference Range Estrogen and Progesterone Receptor       Negative  0%       Positive  >1%     Imaging Results (Last 48 hours)  DG Bone Density   Result Date: 05/25/2022 EXAM: DUAL X-RAY ABSORPTIOMETRY (DXA) FOR BONE MINERAL DENSITY IMPRESSION: Your patient Abbigal Radich completed a BMD test on 05/25/2022 using the [place] (software version: 14.10) manufactured by [employer]. The following summarizes the results of our evaluation. Technologist: [facility] PATIENT BIOGRAPHICAL: Name: Landon, Truax Patient ID:  [id number] Birth Date: 08-11-1941 Height:     64.0 in. Gender:      Female Exam Date:  05/25/2022 Weight:     201.6 lbs. INDICATIONS: Advanced Age, Diabetic-insulin dependent, Height Loss, Low Calcium Intake, Post Menopausal Fractures: Treatments DENSITOMETRY RESULTS: Site      Region     Measured Date Measured Age WHO Classification Young Adult T-score BMD         %Change vs. Previous Significant Change (*) AP Spine L1-L2 05/25/2023 80.2 Normal 0.6 1.239 g/cm2 - - DualFemur Neck Right 05/25/2022 80.2 Normal -0.7 0.937 g/cm2 -10.1% Yes DualFemur  Neck Right 01/18/2017 74.8 Normal 0.0 1.042 g/cm2 - - DualFemur Total Mean 05/25/2022 80.2 Normal -0.6 0.937 g/cm2 -10.3% Yes DualFemur Total Mean 01/18/2017 74.8 Normal 0.3 1.045 g/cm2 - - ASSESSMENT: The BMD measured at Femur Neck Right is 0.937 g/cm2 with a T-score of -0.7. This patient is considered normal according to [facility]) criteria. The scan quality is good. Compared with the prior study on 01/18/2017 , the BMD of the total mean shows a statistically significant decrease. L3 and L4 were excluded due to advanced degenerative changes. World Pharmacologist [facility]) criteria for post-menopausal, Caucasian Women: Normal:       T-score at or above -1 SD Osteopenia:   T-score between -1 and -2.5 SD Osteoporosis: T-score at or below -2.5 SD RECOMMENDATIONS: 1. All patients should optimize calcium and vitamin D intake. 2. Consider FDA-approved medical therapies in postmenopausal women and med aged 76 years and older, based on the following: a. A hip or vertebral (clinical or morphometric) fracture b. T-score < -2.5 at the femoral neck or spine after appropriate evaluation to exclude secondary causes  c. Low bone mass (T-score between -1.0 and -2.5 at the femoral neck or spine) and a 10-year probability of a hip fracture > 3% or a 10-year probability of a major osteoporosis-related fracture > 20% based on the [place]-adapted WHO algorithm d. Clinician judgment and/or patient preferences may indicate treatment for people with 10-year fracture probabilities above or below these levels FOLLOW-UP: Patients with diagnosis of osteoporosis or at high risk for fracture should have regular bone mineral density tests. For patients eligible for Medicare, routine testing is allowed once every 2 years. The testing frequency can be increased to one year for patients who have rapidly progressing disease, those who are receiving or discontinuing medical therapy to restore bone mass, or have additional risk factors.  I have reviewed this report, and agree with the above findings. Mark A. Thornton Papas, M.D. [facility] Electronically Signed   By: Lavonia Dana M.D.   On: 05/25/2022 14:12        Assessment & Plan:  SHAHANA CAPES is a 81 y.o. female with a right breast papillary DCIS. She has been thinking about her surgical options and non-surgical options. We discussed that given her age we do not need to do a sentinel node. Discussed that a mastectomy is not necessary given her disease and size and that partial mastectomy could get the entire disease and that she may be able to forgo radiation pending the margins etc. We discussed risk of bleeding, infection, needing more surgery or treatment. Discussed needing radiofrequency tag placement prior to surgery. I think she will likely need 2 tags given the known DCIS at the 6 o'clock 2cmfn position and the subareolar 6 o'clock discordant findings.    Hold Eliquis 2 days prior. She will need the tag placed.  I have sent Dr. Shelly Bombard a message about the tag placement.  Will have to coordinate with radiology and OR to get a date.          Future Appointments  Date Time Provider [place]  07/04/2022  3:15 PM Derek Jack, MD [facility] None  08/17/2022  7:05 AM [facility] DEVICE REMOTES [facility]  11/16/2022  7:05 AM [facility] DEVICE REMOTES [facility]  02/15/2023  7:05 AM [facility] DEVICE REMOTES [facility]  05/17/2023  7:05 AM [facility] DEVICE REMOTES [facility]  08/16/2023  7:05 AM [facility] DEVICE REMOTES [facility]      All questions were answered to the satisfaction of the patient and family.     Virl Cagey 05/26/2022, 5:14 PM

## 2022-06-07 ENCOUNTER — Telehealth: Payer: Self-pay

## 2022-06-07 MED ORDER — VITAMIN D 125 MCG (5000 UT) PO CAPS: 5000.0000 [IU] | ORAL_CAPSULE | Freq: Every day | ORAL | 11 refills | Status: DC

## 2022-06-07 NOTE — Telephone Encounter (Signed)
Spoke with Pt's daughter per DPR.  Advised per Dr. Lovena Le to start Vit D supplement- 5000 units PO once a day.  Daughter indicates understanding.  Await cardiac clearance.

## 2022-06-07 NOTE — Telephone Encounter (Signed)
-----   Message from Evans Lance, MD sent at 06/01/2022  9:32 PM EDT ----- Vitamin D level is very low. Start 5000 units daily. Recheck in 6 months.

## 2022-06-15 NOTE — Patient Instructions (Signed)
Your procedure is scheduled on: 06/22/2022  Report to [place] [facility] at   7:00  AM.  Call this number if you have problems the morning of surgery: [phone]   Remember:   Do not Eat or Drink after midnight         No Smoking the morning of surgery  :  Take these medicines the morning of surgery with A SIP OF WATER: Metoprolol  No Diabetic medication or insulin morning of procedure  Take only 1/2 dose of Lantus 12 units the night before surgery  Hold Eliquis as instructed by Dr Constance Haw   Do not wear jewelry, make-up or nail polish.  Do not wear lotions, powders, or perfumes. You may wear deodorant.  Do not shave 48 hours prior to surgery. Men may shave face and neck.  Do not bring valuables to the hospital.  Contacts, dentures or bridgework may not be worn into surgery.  Leave suitcase in the car. After surgery it may be brought to your room.  For patients admitted to the hospital, checkout time is 11:00 AM the day of discharge.   Patients discharged the day of surgery will not be allowed to drive home.    Special Instructions: Shower using CHG night before surgery and shower the day of surgery use CHG.  Use special wash - you have one bottle of CHG for all showers.  You should use approximately 1/2 of the bottle for each shower.  How to Use Chlorhexidine for Bathing Chlorhexidine gluconate (CHG) is a germ-killing (antiseptic) solution that is used to clean the skin. It can get rid of the bacteria that normally live on the skin and can keep them away for about 24 hours. To clean your skin with CHG, you may be given: A CHG solution to use in the shower or as part of a sponge bath. A prepackaged cloth that contains CHG. Cleaning your skin with CHG may help lower the risk for infection: While you are staying in the intensive care unit of the hospital. If you have a vascular access, such as a central line, to provide short-term or long-term access to your veins. If you  have a catheter to drain urine from your bladder. If you are on a ventilator. A ventilator is a machine that helps you breathe by moving air in and out of your lungs. After surgery. What are the risks? Risks of using CHG include: A skin reaction. Hearing loss, if CHG gets in your ears and you have a perforated eardrum. Eye injury, if CHG gets in your eyes and is not rinsed out. The CHG product catching fire. Make sure that you avoid smoking and flames after applying CHG to your skin. Do not use CHG: If you have a chlorhexidine allergy or have previously reacted to chlorhexidine. On babies younger than 53 months of age. How to use CHG solution Use CHG only as told by your health care provider, and follow the instructions on the label. Use the full amount of CHG as directed. Usually, this is one bottle. During a shower Follow these steps when using CHG solution during a shower (unless your health care provider gives you different instructions): Start the shower. Use your normal soap and shampoo to wash your face and hair. Turn off the shower or move out of the shower stream. Pour the CHG onto a clean washcloth. Do not use any type of brush or rough-edged sponge. Starting at your neck, lather your body down to  your toes. Make sure you follow these instructions: If you will be having surgery, pay special attention to the part of your body where you will be having surgery. Scrub this area for at least 1 minute. Do not use CHG on your head or face. If the solution gets into your ears or eyes, rinse them well with water. Avoid your genital area. Avoid any areas of skin that have broken skin, cuts, or scrapes. Scrub your back and under your arms. Make sure to wash skin folds. Let the lather sit on your skin for 1-2 minutes or as long as told by your health care provider. Thoroughly rinse your entire body in the shower. Make sure that all body creases and crevices are rinsed well. Dry off with a  clean towel. Do not put any substances on your body afterward--such as powder, lotion, or perfume--unless you are told to do so by your health care provider. Only use lotions that are recommended by the manufacturer. Put on clean clothes or pajamas. If it is the night before your surgery, sleep in clean sheets.  During a sponge bath Follow these steps when using CHG solution during a sponge bath (unless your health care provider gives you different instructions): Use your normal soap and shampoo to wash your face and hair. Pour the CHG onto a clean washcloth. Starting at your neck, lather your body down to your toes. Make sure you follow these instructions: If you will be having surgery, pay special attention to the part of your body where you will be having surgery. Scrub this area for at least 1 minute. Do not use CHG on your head or face. If the solution gets into your ears or eyes, rinse them well with water. Avoid your genital area. Avoid any areas of skin that have broken skin, cuts, or scrapes. Scrub your back and under your arms. Make sure to wash skin folds. Let the lather sit on your skin for 1-2 minutes or as long as told by your health care provider. Using a different clean, wet washcloth, thoroughly rinse your entire body. Make sure that all body creases and crevices are rinsed well. Dry off with a clean towel. Do not put any substances on your body afterward--such as powder, lotion, or perfume--unless you are told to do so by your health care provider. Only use lotions that are recommended by the manufacturer. Put on clean clothes or pajamas. If it is the night before your surgery, sleep in clean sheets. How to use CHG prepackaged cloths Only use CHG cloths as told by your health care provider, and follow the instructions on the label. Use the CHG cloth on clean, dry skin. Do not use the CHG cloth on your head or face unless your health care provider tells you to. When washing  with the CHG cloth: Avoid your genital area. Avoid any areas of skin that have broken skin, cuts, or scrapes. Before surgery Follow these steps when using a CHG cloth to clean before surgery (unless your health care provider gives you different instructions): Using the CHG cloth, vigorously scrub the part of your body where you will be having surgery. Scrub using a back-and-forth motion for 3 minutes. The area on your body should be completely wet with CHG when you are done scrubbing. Do not rinse. Discard the cloth and let the area air-dry. Do not put any substances on the area afterward, such as powder, lotion, or perfume. Put on clean clothes or pajamas. If  it is the night before your surgery, sleep in clean sheets.  For general bathing Follow these steps when using CHG cloths for general bathing (unless your health care provider gives you different instructions). Use a separate CHG cloth for each area of your body. Make sure you wash between any folds of skin and between your fingers and toes. Wash your body in the following order, switching to a new cloth after each step: The front of your neck, shoulders, and chest. Both of your arms, under your arms, and your hands. Your stomach and groin area, avoiding the genitals. Your right leg and foot. Your left leg and foot. The back of your neck, your back, and your buttocks. Do not rinse. Discard the cloth and let the area air-dry. Do not put any substances on your body afterward--such as powder, lotion, or perfume--unless you are told to do so by your health care provider. Only use lotions that are recommended by the manufacturer. Put on clean clothes or pajamas. Contact a health care provider if: Your skin gets irritated after scrubbing. You have questions about using your solution or cloth. You swallow any chlorhexidine. Call your local poison control center (1-(914)524-5179 in the [place] Get help right away if: Your eyes itch badly, or  they become very red or swollen. Your skin itches badly and is red or swollen. Your hearing changes. You have trouble seeing. You have swelling or tingling in your mouth or throat. You have trouble breathing. These symptoms may represent a serious problem that is an emergency. Do not wait to see if the symptoms will go away. Get medical help right away. Call your local emergency services (911 in the [place] Do not drive yourself to the hospital. Summary Chlorhexidine gluconate (CHG) is a germ-killing (antiseptic) solution that is used to clean the skin. Cleaning your skin with CHG may help to lower your risk for infection. You may be given CHG to use for bathing. It may be in a bottle or in a prepackaged cloth to use on your skin. Carefully follow your health care provider's instructions and the instructions on the product label. Do not use CHG if you have a chlorhexidine allergy. Contact your health care provider if your skin gets irritated after scrubbing. This information is not intended to replace advice given to you by your health care provider. Make sure you discuss any questions you have with your health care provider. Document Revised: 02/22/2021 Document Reviewed: 02/22/2021 Elsevier Patient Education  [place]. Partial mastectomy, Care After This sheet gives you information about how to care for yourself after your procedure. Your health care provider may also give you more specific instructions. If you have problems or questions, contact your health care provider. What can I expect after the procedure? After the procedure, it is common to have: Breast swelling. Breast tenderness. Stiffness in your arm or shoulder. A change in the shape and feel of your breast. Scar tissue that feels hard to the touch in the area where the lump was removed. Follow these instructions at home: Medicines Take over-the-counter and prescription medicines only as told by your health care  provider. If you were prescribed an antibiotic medicine, take it as told by your health care provider. Do not stop taking the antibiotic even if you start to feel better. Ask your health care provider if the medicine prescribed to you: Requires you to avoid driving or using heavy machinery. Can cause constipation. You may need to take these actions to  prevent or treat constipation: Drink enough fluid to keep your urine pale yellow. Take over-the-counter or prescription medicines. Eat foods that are high in fiber, such as beans, whole grains, and fresh fruits and vegetables. Limit foods that are high in fat and processed sugars, such as fried or sweet foods. Incision care     Follow instructions from your health care provider about how to take care of your incision. Make sure you: Wash your hands with soap and water before and after you change your bandage (dressing). If soap and water are not available, use hand sanitizer. Change your dressing as told by your health care provider. Leave stitches (sutures), skin glue, or adhesive strips in place. These skin closures may need to stay in place for 2 weeks or longer. If adhesive strip edges start to loosen and curl up, you may trim the loose edges. Do not remove adhesive strips completely unless your health care provider tells you to do that. Check your incision area every day for signs of infection. Check for: More redness, swelling, or pain. Fluid or blood. Warmth. Pus or a bad smell. Keep your dressing clean and dry. If you were sent home with a surgical drain in place, follow instructions from your health care provider about emptying it. Bathing Do not take baths, swim, or use a hot tub until your health care provider approves. Ask your health care provider if you may take showers. You may only be allowed to take sponge baths. Activity Rest as told by your health care provider. Avoid sitting for a long time without moving. Get up to take  short walks every 1-2 hours. This is important to improve blood flow and breathing. Ask for help if you feel weak or unsteady. Return to your normal activities as told by your health care provider. Ask your health care provider what activities are safe for you. Be careful to avoid any activities that could cause an injury to your arm on the side of your surgery. Do not lift anything that is heavier than 10 lb (4.5 kg), or the limit that you are told, until your health care provider says that it is safe. Avoid lifting with the arm that is on the side of your surgery. Do not carry heavy objects on your shoulder on the side of your surgery. Do exercises to keep your shoulder and arm from getting stiff and swollen. Talk with your health care provider about which exercises are safe for you. General instructions Wear a supportive bra as told by your health care provider. Raise (elevate) your arm above the level of your heart while you are sitting or lying down. Do not wear tight jewelry on your arm, wrist, or fingers on the side of your surgery. Keep all follow-up visits as told by your health care provider. This is important. You may need to be screened for extra fluid around the lymph nodes and swelling in the breast and arm (lymphedema). Follow instructions from your health care provider about how often you should be checked. If you had any lymph nodes removed during your procedure, be sure to tell all of your health care providers. This is important information to share before you are involved in certain procedures, such as having blood tests or having your blood pressure taken. Contact a health care provider if: You develop a rash. You have a fever. Your pain medicine is not working. You have swelling, weakness, or numbness in your arm that does not improve after a  few weeks. You have new swelling in your breast. You have any of these signs of infection: More redness, swelling, or pain in your  incision area. Fluid or blood coming from your incision. Warmth coming from the incision area. Pus or a bad smell coming from your incision. Get help right away if you have: Very bad pain in your breast or arm. Swelling in your legs or arms. Redness, warmth, or pain in your leg or arm. Chest pain. Difficulty breathing. Summary After the procedure, it is common to have breast tenderness, swelling in your breast, and stiffness in your arm and shoulder. Follow instructions from your health care provider about how to take care of your incision. Do not lift anything that is heavier than 10 lb (4.5 kg), or the limit that you are told, until your health care provider says that it is safe. Avoid lifting with the arm that is on the side of your surgery. If you had any lymph nodes removed during your procedure, be sure to tell all of your health care providers. This is important information to share before you are involved in certain procedures, such as having blood tests or having your blood pressure taken. This information is not intended to replace advice given to you by your health care provider. Make sure you discuss any questions you have with your health care provider. Document Revised: 06/17/2019 Document Reviewed: 06/17/2019 Elsevier Patient Education  [place] Anesthesia, Adult, Care After This sheet gives you information about how to care for yourself after your procedure. Your health care provider may also give you more specific instructions. If you have problems or questions, contact your health care provider. What can I expect after the procedure? After the procedure, the following side effects are common: Pain or discomfort at the IV site. Nausea. Vomiting. Sore throat. Trouble concentrating. Feeling cold or chills. Feeling weak or tired. Sleepiness and fatigue. Soreness and body aches. These side effects can affect parts of the body that were not involved in  surgery. Follow these instructions at home: For the time period you were told by your health care provider:  Rest. Do not participate in activities where you could fall or become injured. Do not drive or use machinery. Do not drink alcohol. Do not take sleeping pills or medicines that cause drowsiness. Do not make important decisions or sign legal documents. Do not take care of children on your own. Eating and drinking Follow any instructions from your health care provider about eating or drinking restrictions. When you feel hungry, start by eating small amounts of foods that are soft and easy to digest (bland), such as toast. Gradually return to your regular diet. Drink enough fluid to keep your urine pale yellow. If you vomit, rehydrate by drinking water, juice, or clear broth. General instructions If you have sleep apnea, surgery and certain medicines can increase your risk for breathing problems. Follow instructions from your health care provider about wearing your sleep device: Anytime you are sleeping, including during daytime naps. While taking prescription pain medicines, sleeping medicines, or medicines that make you drowsy. Have a responsible adult stay with you for the time you are told. It is important to have someone help care for you until you are awake and alert. Return to your normal activities as told by your health care provider. Ask your health care provider what activities are safe for you. Take over-the-counter and prescription medicines only as told by your health care provider. If you  smoke, do not smoke without supervision. Keep all follow-up visits as told by your health care provider. This is important. Contact a health care provider if: You have nausea or vomiting that does not get better with medicine. You cannot eat or drink without vomiting. You have pain that does not get better with medicine. You are unable to pass urine. You develop a skin rash. You  have a fever. You have redness around your IV site that gets worse. Get help right away if: You have difficulty breathing. You have chest pain. You have blood in your urine or stool, or you vomit blood. Summary After the procedure, it is common to have a sore throat or nausea. It is also common to feel tired. Have a responsible adult stay with you for the time you are told. It is important to have someone help care for you until you are awake and alert. When you feel hungry, start by eating small amounts of foods that are soft and easy to digest (bland), such as toast. Gradually return to your regular diet. Drink enough fluid to keep your urine pale yellow. Return to your normal activities as told by your health care provider. Ask your health care provider what activities are safe for you. This information is not intended to replace advice given to you by your health care provider. Make sure you discuss any questions you have with your health care provider. Document Revised: 08/27/2020 Document Reviewed: 03/26/2020 Elsevier Patient Education  [place].

## 2022-06-16 ENCOUNTER — Other Ambulatory Visit: Payer: Self-pay | Admitting: Adult Health

## 2022-06-16 ENCOUNTER — Telehealth: Payer: Self-pay | Admitting: *Deleted

## 2022-06-16 NOTE — Telephone Encounter (Signed)
Call placed to patient and patient made aware.  

## 2022-06-16 NOTE — Telephone Encounter (Signed)
-----   Message from Virl Cagey, MD sent at 06/16/2022 11:42 AM EDT ----- Regarding: RE: Does Ms Loyola know to hold Eliquis 48 hours prior to my procedure ----- Message ----- From: Sheral Flow, LPN Sent: 1/69/6789  10:43 AM EDT To: Virl Cagey, MD Subject: RE: Does Ms Puccini know to hold Eliquis          Dr. Enriqueta Shutter, radiologist states that patient does not have to stop Eliquis for Tag placement.   Patient is taking Eliquis for Hx PE.   How long do you want her to hold medication? ----- Message ----- From: Virl Cagey, MD Sent: 06/15/2022   4:26 PM EDT To: Wilmer Floor, RN; Eden Lathe Analyce Tavares, LPN Subject: RE: Does Ms Slagel know to hold Eliquis          Is 5 days what radiology wants? Usually for eliquis it is 48-72 hours.  Ria Comment ----- Message ----- From: Sheral Flow, LPN Sent: 3/81/0175   4:00 PM EDT To: Wilmer Floor, RN; Virl Cagey, MD Subject: RE: Does Ms Bal know to hold Eliquis          Call placed to patient and advised to stop Eliquis for upcoming surgeries.   Advised that Eliquis should be held x5 days prior to tag placement, and medication can be resumed after surgery.  ----- Message ----- From: Virl Cagey, MD Sent: 06/15/2022   1:05 PM EDT To: Wilmer Floor, RN; Eden Lathe Maecyn Panning, LPN Subject: RE: Does Ms Gladue know to hold Eliquis          Yes she needs to hold for her tag and my procedure.  Margreta Journey can you check on this. Make sure she has her blood thinner held. ----- Message ----- From: Wilmer Floor, RN Sent: 06/15/2022   1:00 PM EDT To: Virl Cagey, MD Subject: Does Ms Westergard know to hold Eliquis              Hey Dr Constance Haw,  I did not see a note about holding Eliquis.  Se is coming 6/26 for PAT.  Radio tag for 6/27. Partial mastectomy for 6/28.  Also I see a note to see Dr Lovena Le about SOB  Thanks,  Rosalyn Gess RN

## 2022-06-20 ENCOUNTER — Encounter (HOSPITAL_COMMUNITY): Payer: Self-pay

## 2022-06-20 ENCOUNTER — Encounter (HOSPITAL_COMMUNITY)
Admission: RE | Admit: 2022-06-20 | Discharge: 2022-06-20 | Disposition: A | Discharge status: Home / Self Care | Payer: Medicare HMO | Source: Ambulatory Visit | Attending: General Surgery | Admitting: General Surgery

## 2022-06-20 VITALS — BP 128/60 | HR 61 | Temp 97.9°F | Resp 18 | Ht 64.0 in | Wt 196.0 lb

## 2022-06-20 DIAGNOSIS — Z01812 Encounter for preprocedural laboratory examination: Secondary | ICD-10-CM | POA: Diagnosis not present

## 2022-06-20 DIAGNOSIS — E119 Type 2 diabetes mellitus without complications: Secondary | ICD-10-CM | POA: Insufficient documentation

## 2022-06-20 HISTORY — DX: Dyspnea, unspecified: R06.00

## 2022-06-21 ENCOUNTER — Other Ambulatory Visit (HOSPITAL_COMMUNITY): Payer: Self-pay | Admitting: General Surgery

## 2022-06-21 ENCOUNTER — Ambulatory Visit (HOSPITAL_COMMUNITY)
Admission: RE | Admit: 2022-06-21 | Discharge: 2022-06-21 | Disposition: A | Discharge status: Home / Self Care | Payer: Medicare HMO | Source: Ambulatory Visit | Attending: General Surgery | Admitting: General Surgery

## 2022-06-21 DIAGNOSIS — R928 Other abnormal and inconclusive findings on diagnostic imaging of breast: Secondary | ICD-10-CM

## 2022-06-21 DIAGNOSIS — D0511 Intraductal carcinoma in situ of right breast: Secondary | ICD-10-CM

## 2022-06-21 DIAGNOSIS — N6315 Unspecified lump in the right breast, overlapping quadrants: Secondary | ICD-10-CM | POA: Diagnosis not present

## 2022-06-21 LAB — HEMOGLOBIN A1C
Hgb A1c MFr Bld: 6.4 % — ABNORMAL HIGH (ref 4.8–5.6)
Mean Plasma Glucose: 137 mg/dL

## 2022-06-21 MED ORDER — LIDOCAINE HCL (PF) 2 % IJ SOLN
INTRAMUSCULAR | Status: AC
  Filled 2022-06-21: qty 10

## 2022-06-21 MED ORDER — LIDOCAINE-EPINEPHRINE (PF) 1 %-1:200000 IJ SOLN
INTRAMUSCULAR | Status: AC
  Filled 2022-06-21: qty 30

## 2022-06-22 ENCOUNTER — Ambulatory Visit (HOSPITAL_BASED_OUTPATIENT_CLINIC_OR_DEPARTMENT_OTHER): Payer: Medicare HMO | Admitting: Certified Registered"

## 2022-06-22 ENCOUNTER — Other Ambulatory Visit: Payer: Self-pay

## 2022-06-22 ENCOUNTER — Encounter (HOSPITAL_COMMUNITY): Payer: Self-pay | Admitting: General Surgery

## 2022-06-22 ENCOUNTER — Encounter (HOSPITAL_COMMUNITY)
Admission: RE | Disposition: A | Discharge status: Home / Self Care | Payer: Self-pay | Source: Home / Self Care | Attending: General Surgery

## 2022-06-22 ENCOUNTER — Observation Stay (HOSPITAL_COMMUNITY)
Admission: RE | Admit: 2022-06-22 | Discharge: 2022-06-23 | Disposition: A | Discharge status: Home / Self Care | Payer: Medicare HMO | Attending: General Surgery | Admitting: General Surgery

## 2022-06-22 ENCOUNTER — Ambulatory Visit (HOSPITAL_COMMUNITY): Payer: Medicare HMO

## 2022-06-22 ENCOUNTER — Ambulatory Visit (HOSPITAL_COMMUNITY): Payer: Medicare HMO | Admitting: Certified Registered"

## 2022-06-22 DIAGNOSIS — I110 Hypertensive heart disease with heart failure: Secondary | ICD-10-CM | POA: Diagnosis not present

## 2022-06-22 DIAGNOSIS — Z7984 Long term (current) use of oral hypoglycemic drugs: Secondary | ICD-10-CM | POA: Insufficient documentation

## 2022-06-22 DIAGNOSIS — D0511 Intraductal carcinoma in situ of right breast: Secondary | ICD-10-CM | POA: Diagnosis not present

## 2022-06-22 DIAGNOSIS — C50911 Malignant neoplasm of unspecified site of right female breast: Principal | ICD-10-CM | POA: Insufficient documentation

## 2022-06-22 DIAGNOSIS — N6031 Fibrosclerosis of right breast: Secondary | ICD-10-CM | POA: Diagnosis not present

## 2022-06-22 DIAGNOSIS — Z96653 Presence of artificial knee joint, bilateral: Secondary | ICD-10-CM | POA: Insufficient documentation

## 2022-06-22 DIAGNOSIS — I5032 Chronic diastolic (congestive) heart failure: Secondary | ICD-10-CM

## 2022-06-22 DIAGNOSIS — E119 Type 2 diabetes mellitus without complications: Secondary | ICD-10-CM | POA: Diagnosis not present

## 2022-06-22 DIAGNOSIS — C50919 Malignant neoplasm of unspecified site of unspecified female breast: Secondary | ICD-10-CM | POA: Diagnosis present

## 2022-06-22 DIAGNOSIS — D241 Benign neoplasm of right breast: Secondary | ICD-10-CM

## 2022-06-22 DIAGNOSIS — Z85038 Personal history of other malignant neoplasm of large intestine: Secondary | ICD-10-CM | POA: Insufficient documentation

## 2022-06-22 DIAGNOSIS — I10 Essential (primary) hypertension: Secondary | ICD-10-CM | POA: Insufficient documentation

## 2022-06-22 DIAGNOSIS — Z86711 Personal history of pulmonary embolism: Secondary | ICD-10-CM | POA: Diagnosis not present

## 2022-06-22 DIAGNOSIS — D0581 Other specified type of carcinoma in situ of right breast: Secondary | ICD-10-CM | POA: Diagnosis present

## 2022-06-22 DIAGNOSIS — N6041 Mammary duct ectasia of right breast: Secondary | ICD-10-CM | POA: Insufficient documentation

## 2022-06-22 DIAGNOSIS — Z170 Estrogen receptor positive status [ER+]: Secondary | ICD-10-CM | POA: Diagnosis not present

## 2022-06-22 DIAGNOSIS — Z950 Presence of cardiac pacemaker: Secondary | ICD-10-CM | POA: Diagnosis not present

## 2022-06-22 DIAGNOSIS — R928 Other abnormal and inconclusive findings on diagnostic imaging of breast: Secondary | ICD-10-CM

## 2022-06-22 DIAGNOSIS — Z79899 Other long term (current) drug therapy: Secondary | ICD-10-CM | POA: Diagnosis not present

## 2022-06-22 HISTORY — PX: BREAST LUMPECTOMY: SHX2

## 2022-06-22 LAB — GLUCOSE, CAPILLARY
Glucose-Capillary: 167 mg/dL — ABNORMAL HIGH (ref 70–99)
Glucose-Capillary: 135 mg/dL — ABNORMAL HIGH (ref 70–99)
Glucose-Capillary: 235 mg/dL — ABNORMAL HIGH (ref 70–99)
Glucose-Capillary: 268 mg/dL — ABNORMAL HIGH (ref 70–99)

## 2022-06-22 SURGERY — PARTIAL MASTECTOMY WITH RADIO FREQUENCY LOCALIZER
Anesthesia: General | Laterality: Right

## 2022-06-22 MED ORDER — METFORMIN HCL 500 MG PO TABS
1000.0000 mg | ORAL_TABLET | Freq: Two times a day (BID) | ORAL | Status: DC
Start: 2022-06-23 — End: 2022-06-23
  Administered 2022-06-23: 1000 mg via ORAL
  Filled 2022-06-22: qty 2

## 2022-06-22 MED ORDER — BUPIVACAINE HCL (PF) 0.5 % IJ SOLN
INTRAMUSCULAR | Status: AC
  Filled 2022-06-22: qty 30

## 2022-06-22 MED ORDER — ONDANSETRON HCL 4 MG/2ML IJ SOLN
INTRAMUSCULAR | Status: AC
  Filled 2022-06-22: qty 2

## 2022-06-22 MED ORDER — 0.9 % SODIUM CHLORIDE (POUR BTL) OPTIME
TOPICAL | Status: DC | PRN
  Administered 2022-06-22: 1000 mL

## 2022-06-22 MED ORDER — PROPOFOL 10 MG/ML IV BOLUS
INTRAVENOUS | Status: AC
  Filled 2022-06-22: qty 20

## 2022-06-22 MED ORDER — ACETAMINOPHEN 500 MG PO TABS
1000.0000 mg | ORAL_TABLET | Freq: Four times a day (QID) | ORAL | Status: DC
Start: 2022-06-22 — End: 2022-06-23
  Administered 2022-06-22 – 2022-06-23 (×4): 1000 mg via ORAL
  Filled 2022-06-22 (×4): qty 2

## 2022-06-22 MED ORDER — PROPOFOL 10 MG/ML IV BOLUS
INTRAVENOUS | Status: DC | PRN
  Administered 2022-06-22: 140 mg via INTRAVENOUS

## 2022-06-22 MED ORDER — FENTANYL CITRATE PF 50 MCG/ML IJ SOSY: 25.0000 ug | PREFILLED_SYRINGE | INTRAMUSCULAR | Status: DC | PRN

## 2022-06-22 MED ORDER — ONDANSETRON HCL 4 MG/2ML IJ SOLN: 4.0000 mg | Freq: Once | INTRAMUSCULAR | Status: DC | PRN

## 2022-06-22 MED ORDER — DIPHENHYDRAMINE HCL 12.5 MG/5ML PO ELIX: 12.5000 mg | ORAL_SOLUTION | Freq: Four times a day (QID) | ORAL | Status: DC | PRN

## 2022-06-22 MED ORDER — FUROSEMIDE 40 MG PO TABS
40.0000 mg | ORAL_TABLET | Freq: Every day | ORAL | Status: DC
  Administered 2022-06-23: 40 mg via ORAL
  Filled 2022-06-22 (×2): qty 1

## 2022-06-22 MED ORDER — INSULIN GLARGINE-YFGN 100 UNIT/ML ~~LOC~~ SOLN
15.0000 [IU] | Freq: Every day | SUBCUTANEOUS | Status: DC
  Administered 2022-06-22: 15 [IU] via SUBCUTANEOUS
  Filled 2022-06-22 (×2): qty 0.15

## 2022-06-22 MED ORDER — ATORVASTATIN CALCIUM 40 MG PO TABS
40.0000 mg | ORAL_TABLET | Freq: Every day | ORAL | Status: DC
  Administered 2022-06-22: 40 mg via ORAL
  Filled 2022-06-22 (×2): qty 1

## 2022-06-22 MED ORDER — ONDANSETRON HCL 4 MG/2ML IJ SOLN: 4.0000 mg | Freq: Four times a day (QID) | INTRAMUSCULAR | Status: DC | PRN

## 2022-06-22 MED ORDER — LACTATED RINGERS IV SOLN: INTRAVENOUS | Status: DC

## 2022-06-22 MED ORDER — BUPIVACAINE HCL (PF) 0.5 % IJ SOLN
INTRAMUSCULAR | Status: DC | PRN
  Administered 2022-06-22: 20 mL

## 2022-06-22 MED ORDER — METOPROLOL TARTRATE 5 MG/5ML IV SOLN: 5.0000 mg | Freq: Four times a day (QID) | INTRAVENOUS | Status: DC | PRN

## 2022-06-22 MED ORDER — DIPHENHYDRAMINE HCL 50 MG/ML IJ SOLN: 12.5000 mg | Freq: Four times a day (QID) | INTRAMUSCULAR | Status: DC | PRN

## 2022-06-22 MED ORDER — LIDOCAINE 2% (20 MG/ML) 5 ML SYRINGE
INTRAMUSCULAR | Status: DC | PRN
  Administered 2022-06-22: 80 mg via INTRAVENOUS

## 2022-06-22 MED ORDER — SIMETHICONE 80 MG PO CHEW: 40.0000 mg | CHEWABLE_TABLET | Freq: Four times a day (QID) | ORAL | Status: DC | PRN

## 2022-06-22 MED ORDER — DEXAMETHASONE SODIUM PHOSPHATE 10 MG/ML IJ SOLN
INTRAMUSCULAR | Status: AC
  Filled 2022-06-22: qty 1

## 2022-06-22 MED ORDER — ACETAMINOPHEN 325 MG PO TABS: 650.0000 mg | ORAL_TABLET | Freq: Four times a day (QID) | ORAL | Status: DC | PRN

## 2022-06-22 MED ORDER — DEXAMETHASONE SODIUM PHOSPHATE 10 MG/ML IJ SOLN
INTRAMUSCULAR | Status: DC | PRN
  Administered 2022-06-22: 10 mg via INTRAVENOUS

## 2022-06-22 MED ORDER — FENTANYL CITRATE (PF) 100 MCG/2ML IJ SOLN
INTRAMUSCULAR | Status: AC
  Filled 2022-06-22: qty 2

## 2022-06-22 MED ORDER — FENTANYL CITRATE (PF) 100 MCG/2ML IJ SOLN
INTRAMUSCULAR | Status: DC | PRN
  Administered 2022-06-22 (×4): 25 ug via INTRAVENOUS

## 2022-06-22 MED ORDER — LIDOCAINE HCL (PF) 2 % IJ SOLN
INTRAMUSCULAR | Status: AC
  Filled 2022-06-22: qty 5

## 2022-06-22 MED ORDER — DOCUSATE SODIUM 100 MG PO CAPS
100.0000 mg | ORAL_CAPSULE | Freq: Two times a day (BID) | ORAL | Status: DC
  Administered 2022-06-22 – 2022-06-23 (×3): 100 mg via ORAL
  Filled 2022-06-22 (×3): qty 1

## 2022-06-22 MED ORDER — PHENYLEPHRINE 80 MCG/ML (10ML) SYRINGE FOR IV PUSH (FOR BLOOD PRESSURE SUPPORT)
PREFILLED_SYRINGE | INTRAVENOUS | Status: DC | PRN
  Administered 2022-06-22: 160 ug via INTRAVENOUS
  Administered 2022-06-22: 240 ug via INTRAVENOUS
  Administered 2022-06-22 (×2): 160 ug via INTRAVENOUS

## 2022-06-22 MED ORDER — LOSARTAN POTASSIUM 50 MG PO TABS
100.0000 mg | ORAL_TABLET | Freq: Every day | ORAL | Status: DC
  Administered 2022-06-22 – 2022-06-23 (×2): 100 mg via ORAL
  Filled 2022-06-22 (×2): qty 2

## 2022-06-22 MED ORDER — CHLORHEXIDINE GLUCONATE CLOTH 2 % EX PADS: 6.0000 | MEDICATED_PAD | Freq: Once | CUTANEOUS | Status: DC

## 2022-06-22 MED ORDER — INSULIN ASPART 100 UNIT/ML IJ SOLN
0.0000 [IU] | Freq: Three times a day (TID) | INTRAMUSCULAR | Status: DC
  Administered 2022-06-22: 5 [IU] via SUBCUTANEOUS
  Administered 2022-06-23: 3 [IU] via SUBCUTANEOUS

## 2022-06-22 MED ORDER — OXYCODONE HCL 5 MG PO TABS: 5.0000 mg | ORAL_TABLET | ORAL | Status: DC | PRN

## 2022-06-22 MED ORDER — ORAL CARE MOUTH RINSE: 15.0000 mL | Freq: Once | OROMUCOSAL | Status: AC

## 2022-06-22 MED ORDER — CHLORHEXIDINE GLUCONATE 0.12 % MT SOLN
15.0000 mL | Freq: Once | OROMUCOSAL | Status: AC
  Administered 2022-06-22: 15 mL via OROMUCOSAL

## 2022-06-22 MED ORDER — ONDANSETRON 4 MG PO TBDP: 4.0000 mg | ORAL_TABLET | Freq: Four times a day (QID) | ORAL | Status: DC | PRN

## 2022-06-22 MED ORDER — VANCOMYCIN HCL IN DEXTROSE 1-5 GM/200ML-% IV SOLN
1000.0000 mg | INTRAVENOUS | Status: AC
  Administered 2022-06-22: 1000 mg via INTRAVENOUS
  Filled 2022-06-22: qty 200

## 2022-06-22 MED ORDER — MORPHINE SULFATE (PF) 2 MG/ML IV SOLN: 2.0000 mg | INTRAVENOUS | Status: DC | PRN

## 2022-06-22 MED ORDER — ONDANSETRON HCL 4 MG/2ML IJ SOLN
INTRAMUSCULAR | Status: DC | PRN
  Administered 2022-06-22: 4 mg via INTRAVENOUS

## 2022-06-22 MED ORDER — NITROGLYCERIN 0.4 MG SL SUBL: 0.4000 mg | SUBLINGUAL_TABLET | SUBLINGUAL | Status: DC | PRN

## 2022-06-22 MED ORDER — METOPROLOL SUCCINATE ER 50 MG PO TB24
50.0000 mg | ORAL_TABLET | Freq: Every day | ORAL | Status: DC
  Administered 2022-06-23: 50 mg via ORAL
  Filled 2022-06-22: qty 1

## 2022-06-22 SURGICAL SUPPLY — 33 items
ADH SKN CLS APL DERMABOND .7 (GAUZE/BANDAGES/DRESSINGS) ×1
APL PRP STRL LF DISP 70% ISPRP (MISCELLANEOUS) ×1
CHLORAPREP W/TINT 26 (MISCELLANEOUS) ×3 IMPLANT
CLOTH BEACON ORANGE TIMEOUT ST (SAFETY) ×3 IMPLANT
COVER LIGHT HANDLE STERIS (MISCELLANEOUS) ×6 IMPLANT
DECANTER SPIKE VIAL GLASS SM (MISCELLANEOUS) ×3 IMPLANT
DERMABOND ADVANCED (GAUZE/BANDAGES/DRESSINGS) ×1
DERMABOND ADVANCED .7 DNX12 (GAUZE/BANDAGES/DRESSINGS) ×2 IMPLANT
ELECT REM PT RETURN 9FT ADLT (ELECTROSURGICAL) ×2
ELECTRODE REM PT RTRN 9FT ADLT (ELECTROSURGICAL) ×2 IMPLANT
GLOVE BIO SURGEON STRL SZ 6.5 (GLOVE) ×4 IMPLANT
GLOVE BIOGEL PI IND STRL 6.5 (GLOVE) ×2 IMPLANT
GLOVE BIOGEL PI IND STRL 7.0 (GLOVE) ×4 IMPLANT
GLOVE BIOGEL PI INDICATOR 6.5 (GLOVE) ×3
GLOVE BIOGEL PI INDICATOR 7.0 (GLOVE) ×2
GOWN STRL REUS W/TWL LRG LVL3 (GOWN DISPOSABLE) ×6 IMPLANT
KIT MARKER MARGIN INK (KITS) ×1 IMPLANT
KIT TURNOVER KIT A (KITS) ×3 IMPLANT
MANIFOLD NEPTUNE II (INSTRUMENTS) ×3 IMPLANT
NDL HYPO 25X1 1.5 SAFETY (NEEDLE) ×2 IMPLANT
NEEDLE HYPO 25X1 1.5 SAFETY (NEEDLE) ×2 IMPLANT
NS IRRIG 1000ML POUR BTL (IV SOLUTION) ×3 IMPLANT
PACK MINOR (CUSTOM PROCEDURE TRAY) ×3 IMPLANT
PAD ARMBOARD 7.5X6 YLW CONV (MISCELLANEOUS) ×3 IMPLANT
SET BASIN LINEN APH (SET/KITS/TRAYS/PACK) ×3 IMPLANT
SET LOCALIZER 20 PROBE US (MISCELLANEOUS) ×3 IMPLANT
SPONGE T-LAP 18X18 ~~LOC~~+RFID (SPONGE) ×3 IMPLANT
SUT MNCRL AB 4-0 PS2 18 (SUTURE) ×3 IMPLANT
SUT SILK 0 FSL (SUTURE) ×3 IMPLANT
SUT VIC AB 3-0 SH 27 (SUTURE) ×2
SUT VIC AB 3-0 SH 27X BRD (SUTURE) ×2 IMPLANT
SUT VIC AB 4-0 PS2 27 (SUTURE) ×2 IMPLANT
SYR CONTROL 10ML LL (SYRINGE) ×3 IMPLANT

## 2022-06-22 NOTE — Progress Notes (Signed)
[facility]  Updated family. Will keep overnight and likely home tomorrow.   PRN for pain SSI and Glucose checks Diabetic diet   Curlene Labrum, MD [facility] [address] [place], [place] [address] [phone] (office)

## 2022-06-22 NOTE — Interval H&P Note (Signed)
History and Physical Interval Note:  06/22/2022 8:14 AM  Natalie Castro  has presented today for surgery, with the diagnosis of DCIS RIGHT BREAST  PAPILLARY CARCINOMA.  The various methods of treatment have been discussed with the patient and family. After consideration of risks, benefits and other options for treatment, the patient has consented to  Procedure(s): PARTIAL MASTECTOMY WITH RADIO FREQUENCY LOCALIZER (Right) as a surgical intervention.  The patient's history has been reviewed, patient examined, no change in status, stable for surgery.  I have reviewed the patient's chart and labs.  Questions were answered to the patient's satisfaction.    Partial mastectomy Virl Cagey

## 2022-06-22 NOTE — Anesthesia Procedure Notes (Signed)
Procedure Name: LMA Insertion Date/Time: 06/22/2022 8:35 AM  Performed by: Myna Bright, CRNAPre-anesthesia Checklist: Patient identified, Emergency Drugs available, Suction available and Patient being monitored Patient Re-evaluated:Patient Re-evaluated prior to induction Oxygen Delivery Method: Circle system utilized Preoxygenation: Pre-oxygenation with 100% oxygen Induction Type: IV induction Ventilation: Mask ventilation without difficulty LMA: LMA inserted LMA Size: 4.0 Tube type: Oral Number of attempts: 1 Placement Confirmation: positive ETCO2 and breath sounds checked- equal and bilateral Tube secured with: Tape Dental Injury: Teeth and Oropharynx as per pre-operative assessment

## 2022-06-22 NOTE — Anesthesia Preprocedure Evaluation (Signed)
Anesthesia Evaluation  Patient identified by MRN, date of birth, ID band Patient awake    Reviewed: Allergy & Precautions, H&P , NPO status , Patient's Chart, lab work & pertinent test results, reviewed documented beta blocker date and time   Airway Mallampati: II  TM Distance: >3 FB Neck ROM: full    Dental no notable dental hx.    Pulmonary shortness of breath,    Pulmonary exam normal breath sounds clear to auscultation       Cardiovascular Exercise Tolerance: Good hypertension, +CHF  + dysrhythmias + pacemaker  Rhythm:regular Rate:Normal     Neuro/Psych negative neurological ROS  negative psych ROS   GI/Hepatic Neg liver ROS, GERD  Medicated,  Endo/Other  negative endocrine ROSdiabetes, Type 2  Renal/GU negative Renal ROS  negative genitourinary   Musculoskeletal   Abdominal   Peds  Hematology negative hematology ROS (+)   Anesthesia Other Findings   Reproductive/Obstetrics negative OB ROS                             Anesthesia Physical Anesthesia Plan  ASA: 3  Anesthesia Plan: General and General LMA   Post-op Pain Management:    Induction:   PONV Risk Score and Plan: Ondansetron  Airway Management Planned:   Additional Equipment:   Intra-op Plan:   Post-operative Plan:   Informed Consent: I have reviewed the patients History and Physical, chart, labs and discussed the procedure including the risks, benefits and alternatives for the proposed anesthesia with the patient or authorized representative who has indicated his/her understanding and acceptance.     Dental Advisory Given  Plan Discussed with: CRNA  Anesthesia Plan Comments:         Anesthesia Quick Evaluation

## 2022-06-22 NOTE — Transfer of Care (Signed)
Immediate Anesthesia Transfer of Care Note  Patient: Natalie Castro  Procedure(s) Performed: PARTIAL MASTECTOMY WITH RADIO FREQUENCY LOCALIZER (Right)  Patient Location: PACU  Anesthesia Type:General  Level of Consciousness: awake, alert , oriented and patient cooperative  Airway & Oxygen Therapy: Patient Spontanous Breathing and Patient connected to nasal cannula oxygen  Post-op Assessment: Report given to RN, Post -op Vital signs reviewed and stable and Patient moving all extremities  Post vital signs: Reviewed and stable  Last Vitals:  Vitals Value Taken Time  BP 156/73 06/22/22 1024  Temp 36.5 C 06/22/22 1023  Pulse 60 06/22/22 1028  Resp 10 06/22/22 1028  SpO2 98 % 06/22/22 1028  Vitals shown include unvalidated device data.  Last Pain:  Vitals:   06/22/22 0730  TempSrc: Oral  PainSc: 0-No pain      Patients Stated Pain Goal: 5 (49/20/10 0712)  Complications: No notable events documented.

## 2022-06-22 NOTE — Op Note (Signed)
[facility] Operative Note  06/22/22  Preoperative Diagnosis: Right breast DCIS, mass    Postoperative Diagnosis: Same   Procedure(s) Performed: Partial mastectomy after radiofrequency tag placement    Surgeon: Lanell Matar. Constance Haw, MD   Assistants: No qualified resident was available    Anesthesia: General endotracheal   Anesthesiologist: Louann Sjogren, MD    Specimens: Right breast mass (marked with pain); additional medial margin (medial aspect painted), posterior margin (posterior aspect painted) and areolar tissue margin    Estimated Blood Loss: Minimal   Blood Replacement: None    Complications: None   Wound Class: Clean    Operative Indications: Natalie Castro is a 81 yo with a right breast DCIS and potential for some invasive component given the pathology from the biopsy who has seen Oncology and agree given her age and hormone receptor status, she can undergo partial mastectomy and does not need any sentinel node biopsy and likely will not need any radiation as long as the margins are free of disease. We discussed risk of bleeding, infection, needing more surgery or treatment and that with her age we are trying to appropriately treat the disease without over treating the breast cancer.   Findings:  Specimen Mammogram with 2 biopsy clips and 2 radiofrequency tags    Procedure: The patient was taken to the operating room and placed supine. General endotracheal anesthesia was induced. Intravenous antibiotics were administered per protocol.  The right breast was prepared and draped in the usual sterile fashion.   The radiofrequency tags were located with the localizer and two separate tag numbers were verified. The mammograms from the tag placement had been reviewed and were up in the operating room. Given the two areas one at the retro-areolar area and one at the 6 pm 2cm from the areola, I felt it was going to be best to do a radial incision on the inferior  breast and incorporate both areas into one specimen.   An incision was made and carried down to the breast tissue. Flaps were created. Using the localizer the specimen was removed ensuring adequate margins around the tags with cautery and scissors dissection. The more superior retroareolar tag was very close to the margin and a silk suture was used to keep it from falling out of the specimen.    The specimen was marked with paint in the standard fashion. The specimen mammogram verified the biopsy clips and the radiofrequency tags were in place. The 6pm 2cm from the areola clip and tag had a firm mass in the area of the posterior and medial margin, and additional posterior and medial margins were taken to ensure that sufficient tissue was taken. This was down to the pectoralis fascia on the posterior margin, and the medial and posterior aspects of each were painted accordingly (with the unpainted side being the closest to the specimen).   The retroareolar biopsy clip and tag appeared close to the margin on the mammogram and I had dissected across the nipple areolar complex with scissors and took all the breast tissue underlying the nipple aside from a small amount at the 12 o'clock position which was taken to ensure that no additional disease was located. The areolar tissue left in place included the dermis and remaining ducts leading out to the nipple.   The breast was very lax and the cavity left after the specimen was pulled laterally due to her breast laxity. To help with closer and help with cosmesis and additional skin margin  was taken (silk suture superior) and the lateral aspect of the areola was incised and repositioned in the incision to allow for closure.   Hemostasis was confirmed. Saline was irrigated. The deep space was re-approximated with 3-0 Vicryl interrupted to help with the lateral pull from the laxity and the dermis was re-approximated with 3-0 Interrupted vicryl along the incision and  around the lateral aspect of the areola. The skin was closed with 4-0 Monocryl subcuticular and dermabond.   Given the margins being at the nipple against the dermis of the nipple and to the pectoralis, if the patient requires more excision, it is likely she will need a formal mastectomy to ensure all disease is removed.   Final inspection revealed acceptable hemostasis. All counts were correct at the end of the case. The patient was awakened from anesthesia and extubated without complication.  The patient went to the PACU in stable condition.   Curlene Labrum, MD [facility] [address] [place], [place] [address] [phone] (office)

## 2022-06-23 ENCOUNTER — Other Ambulatory Visit: Payer: Self-pay

## 2022-06-23 DIAGNOSIS — C50911 Malignant neoplasm of unspecified site of right female breast: Secondary | ICD-10-CM | POA: Diagnosis not present

## 2022-06-23 DIAGNOSIS — Z96653 Presence of artificial knee joint, bilateral: Secondary | ICD-10-CM | POA: Diagnosis not present

## 2022-06-23 DIAGNOSIS — N6041 Mammary duct ectasia of right breast: Secondary | ICD-10-CM | POA: Diagnosis not present

## 2022-06-23 DIAGNOSIS — N6031 Fibrosclerosis of right breast: Secondary | ICD-10-CM | POA: Diagnosis not present

## 2022-06-23 DIAGNOSIS — I10 Essential (primary) hypertension: Secondary | ICD-10-CM | POA: Diagnosis not present

## 2022-06-23 DIAGNOSIS — E119 Type 2 diabetes mellitus without complications: Secondary | ICD-10-CM | POA: Diagnosis not present

## 2022-06-23 DIAGNOSIS — Z85038 Personal history of other malignant neoplasm of large intestine: Secondary | ICD-10-CM | POA: Diagnosis not present

## 2022-06-23 DIAGNOSIS — Z86711 Personal history of pulmonary embolism: Secondary | ICD-10-CM | POA: Diagnosis not present

## 2022-06-23 DIAGNOSIS — Z950 Presence of cardiac pacemaker: Secondary | ICD-10-CM | POA: Diagnosis not present

## 2022-06-23 LAB — GLUCOSE, CAPILLARY: Glucose-Capillary: 180 mg/dL — ABNORMAL HIGH (ref 70–99)

## 2022-06-23 NOTE — Discharge Summary (Signed)
Physician Discharge Summary  Patient ID: Natalie Castro MRN: [id number] DOB/AGE: 02-04-1941 81 y.o.  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 She was kept overnight to monitor and pain control. She did well with overnight and had no pain. She only required tylenol and no narcotics. She was eating breakfast and ready to go home in the AM.   I had discussed with her family and the patient the possible distortion of the breast cosmetically given the area removed due to the two areas of concern. The patient and family were understanding of this possibility.   Consults: None  Significant Diagnostic Studies: None   Treatments: Right partial mastectomy after radiofrequency tag placement   Discharge Exam: Blood pressure (!) 125/54, pulse (!) 59, temperature 97.8 F (36.6 C), resp. rate 16, height '5\' 5"'$  (1.651 m), weight 88.4 kg, SpO2 94 %. General appearance: alert and no distress Resp: normal work of breathing Breasts: normal appearance, no masses or tenderness, right breast incision c/d.I with dermabond, no erythema or drainage, some minor distortion of breast as expected inferiorly, should improve with time   Disposition: Discharge disposition: 01-Home or Self Care       Discharge Instructions     Call MD for:  difficulty breathing, headache or visual disturbances   Complete by: As directed    Call MD for:  extreme fatigue   Complete by: As directed    Call MD for:  persistant dizziness or light-headedness   Complete by: As directed    Call MD for:  persistant nausea and vomiting   Complete by: As directed    Call MD for:  redness, tenderness,  or signs of infection (pain, swelling, redness, odor or green/yellow discharge around incision site)   Complete by: As directed    Call MD for:  severe uncontrolled pain   Complete by: As directed    Call MD for:  temperature >100.4   Complete by: As directed    Increase activity slowly   Complete by: As directed       Allergies as of 06/23/2022       Reactions   Penicillins Hives, Itching, Other (See Comments)   Has tolerated ceftriaxone 7/22 Has patient had a PCN reaction causing immediate rash, facial/tongue/throat swelling, SOB or lightheadedness with hypotension: No Has patient had a PCN reaction causing severe rash involving mucus membranes or skin necrosis: Yes Has patient had a PCN reaction that required hospitalization No Has patient had a PCN reaction occurring within the last 10 years: No If all of the above answers are "NO", then may proceed with Cephalosporin use.        Medication List     TAKE these medications    acetaminophen 325 MG tablet Commonly known as: TYLENOL Take 650 mg by mouth every 6 (six) hours as needed for moderate pain.   apixaban 5 MG Tabs tablet Commonly known as: Eliquis Take 1 tablet (5 mg total) by mouth 2 (two) times daily.   atorvastatin 40 MG tablet Commonly known as: LIPITOR Take 1 tablet (40 mg total) by mouth daily. What changed: when to take this   BD AutoShield Duo 30G X 5 MM  Misc Generic drug: Insulin Pen Needle 30 gauge x 3/16" (pen needle,diabetic dual safty)   furosemide 40 MG tablet Commonly known as: LASIX Take 1 tablet (40 mg total) by mouth daily.   glucose blood test strip Use to check blood glucose fasting , before lunch and dinner and after your largest meal   Lantus SoloStar 100 UNIT/ML Solostar Pen Generic drug: insulin glargine Inject 10 Units into the skin at bedtime. What changed: how much to take   losartan 100 MG tablet Commonly known as: COZAAR Take 100 mg by mouth daily.   metFORMIN 1000 MG  tablet Commonly known as: GLUCOPHAGE TAKE (1) TABLET BY MOUTH TWICE DAILY.   metoprolol succinate 50 MG 24 hr tablet Commonly known as: TOPROL-XL Take 1 tablet (50 mg total) by mouth daily. Take with or immediately following a meal. What changed: when to take this   nitroGLYCERIN 0.4 MG SL tablet Commonly known as: NITROSTAT Place 1 tablet (0.4 mg total) under the tongue every 5 (five) minutes as needed for chest pain.   onetouch ultrasoft lancets Use to check glucose 4 x daily   vitamin B-12 500 MCG tablet Commonly known as: CYANOCOBALAMIN Take 1 tablet (500 mcg total) by mouth daily. What changed: when to take this   cholecalciferol 25 MCG (1000 UNIT) tablet Commonly known as: VITAMIN D3 Take 4,000 Units by mouth daily.   Vitamin D 125 MCG (5000 UT) Caps Take 5,000 Units by mouth daily.        Follow-up Information     Virl Cagey, MD Follow up on 07/13/2022.   Specialty: General Surgery Why: follow up breast surgery Contact information: [address] [facility]na [facility] [address] [phone]                 Signed: Virl Cagey 06/23/2022, 9:48 AM

## 2022-06-23 NOTE — Discharge Instructions (Signed)
Discharge instructions after breast surgery:   Common Complaints: Pain and bruising at the incision sites.  Swelling at the incision sites.   Diet/ Activity: Diet as tolerated.  You may shower but do not take hot showers as this can disrupt the glue. Rest and listen to your body, but do not remain in bed all day.  Walk everyday for at least 15-20 minutes. Deep cough and move around every 1-2 hours in the first few days after surgery.  Do not lift > 10 lbs for the first 2 weeks after surgery. Do not do anything that makes you feel like you are putting unnecessary pull or stretch on the incision sites.  Do not pick at the dermabond glue on your incision sites.  This glue film will remain in place for 1-2 weeks and will start to peel off.  Do not place lotions or balms on your incision unless instructed to specifically by Dr. Camay Pedigo.   Pain Expectations and Narcotics: -After surgery you will have pain associated with your incisions and this is normal. The pain is muscular and nerve pain, and will get better with time. -You are encouraged and expected to take non narcotic medications like tylenol and ibuprofen (when able) to treat pain as multiple modalities can aid with pain treatment. -Narcotics are only used when pain is severe or there is breakthrough pain. -You are not expected to have a pain score of 0 after surgery, as we cannot prevent pain. A pain score of 3-4 that allows you to be functional, move, walk, and tolerate some activity is the goal. The pain will continue to improve over the days after surgery and is dependent on your surgery. -Due to [place] law, we are only able to give a certain amount of pain medication to treat post operative pain, and we only give additional narcotics on a patient by patient basis.  -For most laparoscopic surgery, studies have shown that the majority of patients only need 10-15 narcotic pills, and for open surgeries most patients only need 15-20.   -Having  appropriate expectations of pain and knowledge of pain management with non narcotics is important as we do not want anyone to become addicted to narcotic pain medication.  -Using ice packs in the first 48 hours and heating pads after 48 hours, wearing an abdominal binder (when recommended), and using over the counter medications are all ways to help with pain management.   -Simple acts like meditation and mindfulness practices after surgery can also help with pain control and research has proven the benefit of these practices.  Medication: Take tylenol and ibuprofen as needed for pain control, alternating every 4-6 hours.  Example:  Tylenol 1000mg @ 6am, 12noon, 6pm, 12midnight (Do not exceed 4000mg of tylenol a day). Ibuprofen 800mg @ 9am, 3pm, 9pm, 3am (Do not exceed 3600mg of ibuprofen a day).  Take Roxicodone for breakthrough pain every 4 hours.  Take Colace for constipation related to narcotic pain medication. If you do not have a bowel movement in 2 days, take Miralax over the counter.  Drink plenty of water to also prevent constipation.   Contact Information: If you have questions or concerns, please call our office, [phone], Monday- Thursday 8AM-5PM and Friday 8AM-12Noon.  If it is after hours or on the weekend, please call [facility] Main Number, [phone], [phone], and ask to speak to the surgeon on call for Dr. Adebayo Ensminger at [place].    

## 2022-06-24 LAB — SURGICAL PATHOLOGY

## 2022-06-24 NOTE — Progress Notes (Signed)
I called patient and updated her that all of the margins were clear and we got the cancer. She is doing well and having no pain.

## 2022-06-24 NOTE — Anesthesia Postprocedure Evaluation (Signed)
Anesthesia Post Note  Patient: Natalie Castro  Procedure(s) Performed: PARTIAL MASTECTOMY WITH RADIO FREQUENCY LOCALIZER (Right)  Patient location during evaluation: Phase II Anesthesia Type: General Level of consciousness: awake Pain management: pain level controlled Vital Signs Assessment: post-procedure vital signs reviewed and stable Respiratory status: spontaneous breathing and respiratory function stable Cardiovascular status: blood pressure returned to baseline and stable Postop Assessment: no headache and no apparent nausea or vomiting Anesthetic complications: no Comments: Late entry   No notable events documented.   Last Vitals:  Vitals:   06/22/22 2333 06/23/22 0337  BP: (!) 147/66 (!) 125/54  Pulse: (!) 57 (!) 59  Resp: 16 16  Temp: 37 C 36.6 C  SpO2: 94% 94%    Last Pain:  Vitals:   06/23/22 0900  TempSrc:   PainSc: 0-No pain                 Louann Sjogren

## 2022-07-04 ENCOUNTER — Inpatient Hospital Stay (HOSPITAL_COMMUNITY): Payer: Medicare HMO | Attending: Hematology | Admitting: Hematology

## 2022-07-04 VITALS — BP 147/57 | HR 59 | Temp 98.2°F | Resp 17 | Ht 65.0 in | Wt 198.0 lb

## 2022-07-04 DIAGNOSIS — D0581 Other specified type of carcinoma in situ of right breast: Secondary | ICD-10-CM | POA: Diagnosis not present

## 2022-07-04 DIAGNOSIS — Z170 Estrogen receptor positive status [ER+]: Secondary | ICD-10-CM | POA: Diagnosis not present

## 2022-07-04 DIAGNOSIS — D0511 Intraductal carcinoma in situ of right breast: Secondary | ICD-10-CM | POA: Insufficient documentation

## 2022-07-04 DIAGNOSIS — E559 Vitamin D deficiency, unspecified: Secondary | ICD-10-CM | POA: Insufficient documentation

## 2022-07-04 DIAGNOSIS — Z79811 Long term (current) use of aromatase inhibitors: Secondary | ICD-10-CM | POA: Insufficient documentation

## 2022-07-04 MED ORDER — ANASTROZOLE 1 MG PO TABS: 1.0000 mg | ORAL_TABLET | Freq: Every day | ORAL | 3 refills | Status: DC

## 2022-07-04 NOTE — Patient Instructions (Addendum)
[place] at [facility] Discharge Instructions  You were seen and examined today by Dr. Delton Coombes.  Dr. Delton Coombes reviewed your results from your surgery.He sent in Anastrozole 1 mg to take once daily. Start taking Vitamin D 5000 units once daily.   Follow-up as scheduled in 4 months.    Thank you for choosing [place] at [facility] to provide your oncology and hematology care.  To afford each patient quality time with our provider, please arrive at least 15 minutes before your scheduled appointment time.   If you have a lab appointment with the [place] please come in thru the Main Entrance and check in at the main information desk.  You need to re-schedule your appointment should you arrive 10 or more minutes late.  We strive to give you quality time with our providers, and arriving late affects you and other patients whose appointments are after yours.  Also, if you no show three or more times for appointments you may be dismissed from the clinic at the providers discretion.     Again, thank you for choosing [facility].  Our hope is that these requests will decrease the amount of time that you wait before being seen by our physicians.       _____________________________________________________________  Should you have questions after your visit to [facility], please contact our office at [phone] and follow the prompts.  Our office hours are 8:00 a.m. and 4:30 p.m. Monday - Friday.  Please note that voicemails left after 4:00 p.m. may not be returned until the following business day.  We are closed weekends and major holidays.  You do have access to a nurse 24-7, just call the main number to the clinic [phone] and do not press any options, hold on the line and a nurse will answer the phone.    For prescription refill requests, have your pharmacy contact our office and allow 72 hours.    Due to  Covid, you will need to wear a mask upon entering the hospital. If you do not have a mask, a mask will be given to you at the Main Entrance upon arrival. For doctor visits, patients may have 1 support person age 100 or older with them. For treatment visits, patients can not have anyone with them due to social distancing guidelines and our immunocompromised population.

## 2022-07-04 NOTE — Progress Notes (Signed)
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 is a 81 y.o. female here today for follow up of her right breast DCIS. Her last visit was on 05/16/2022.   Today she reports feeling good, and she is accompanied by her daughter. She denies history of MI and CVA, and she has pacemaker placed. She received annual mammograms until she was 10 at which point she stopped. She does not take vitamin D daily.   REVIEW OF SYSTEMS:   Review of Systems  Constitutional:  Negative for appetite change and fatigue.  Gastrointestinal:  Positive for diarrhea.  All other systems reviewed and are negative.   I have reviewed the past medical history, past surgical history, social history and family history with the patient and they are unchanged from previous note.   ALLERGIES:   is allergic to penicillins.   MEDICATIONS:  Current Outpatient Medications  Medication Sig Dispense Refill   acetaminophen (TYLENOL) 325 MG tablet Take 650 mg by mouth every 6  (six) hours as needed for moderate pain.     apixaban (ELIQUIS) 5 MG TABS tablet Take 1 tablet (5 mg total) by mouth 2 (two) times daily. 60 tablet 0   atorvastatin (LIPITOR) 40 MG tablet Take 1 tablet (40 mg total) by mouth daily. (Patient taking differently: Take 40 mg by mouth at bedtime.) 30 tablet 0   Cholecalciferol (VITAMIN D) 125 MCG (5000 UT) CAPS Take 5,000 Units by mouth daily. 30 capsule 11   cholecalciferol (VITAMIN D3) 25 MCG (1000 UNIT) tablet Take 4,000 Units by mouth daily.     furosemide (LASIX) 40 MG tablet Take 1 tablet (40 mg total) by mouth daily. 30 tablet 0   glucose blood test strip Use to check blood glucose fasting , before lunch and dinner and after your largest meal 120 each 0   Insulin Pen Needle (BD AUTOSHIELD DUO) 30G X 5 MM MISC 30 gauge x 3/16" (pen needle,diabetic dual safty)     Lancets (ONETOUCH ULTRASOFT) lancets Use to check glucose 4 x daily 120 each 0   LANTUS SOLOSTAR 100 UNIT/ML Solostar Pen Inject 10 Units into the skin at bedtime. (Patient taking differently: Inject 25 Units into the skin at bedtime.) 15 mL 0   losartan (COZAAR) 100 MG tablet Take 100 mg by mouth daily.     metFORMIN (GLUCOPHAGE) 1000 MG tablet TAKE (1) TABLET BY MOUTH TWICE DAILY. 60 tablet 0   metoprolol succinate (TOPROL-XL) 50 MG 24 hr tablet Take 1 tablet (50 mg total) by mouth daily. Take with or immediately following a meal. (Patient taking differently: Take 50  mg by mouth in the morning and at bedtime. Take with or immediately following a meal.) 60 tablet 0   nitroGLYCERIN (NITROSTAT) 0.4 MG SL tablet Place 1 tablet (0.4 mg total) under the tongue every 5 (five) minutes as needed for chest pain. (Patient not taking: Reported on 06/15/2022) 25 tablet 0   vitamin B-12 (CYANOCOBALAMIN) 500 MCG tablet Take 1 tablet (500 mcg total) by mouth daily. (Patient taking differently: Take 500 mcg by mouth 2 (two) times a week.)     No current facility-administered medications for this visit.      PHYSICAL EXAMINATION: Performance status (ECOG): 1 - Symptomatic but completely ambulatory  There were no vitals filed for this visit. Wt Readings from Last 3 Encounters:  06/22/22 194 lb 14.2 oz (88.4 kg)  06/20/22 196 lb (88.9 kg)  05/26/22 194 lb (88 kg)   Physical Exam Vitals reviewed.  Constitutional:      Appearance: Normal appearance. She is obese.     Comments: In wheelchair  Cardiovascular:     Rate and Rhythm: Normal rate and regular rhythm.     Pulses: Normal pulses.     Heart sounds: Normal heart sounds.  Pulmonary:     Effort: Pulmonary effort is normal.     Breath sounds: Normal breath sounds.  Neurological:     General: No focal deficit present.     Mental Status: She is alert and oriented to person, place, and time.  Psychiatric:        Mood and Affect: Mood normal.        Behavior: Behavior normal.     Breast Exam Chaperone: Thana Ates     LABORATORY DATA:  I have reviewed the data as listed    Latest Ref Rng & Units 05/25/2022    1:50 PM 03/18/2022    4:10 AM 03/17/2022    4:29 AM  CMP  Glucose 70 - 99 mg/dL 94  205  226   BUN 8 - 23 mg/dL _0 Creatinine 0.44 - 1.00 mg/dL 0.77  0.84  0.83   Sodium 135 - 145 mmol/L 137  139  138   Potassium 3.5 - 5.1 mmol/L 3.4  3.9  4.1   Chloride 98 - 111 mmol/L 107  108  106   CO2 22 - 32 mmol/L _1 Calcium 8.9 - 10.3 mg/dL 8.3  7.9  8.1   Total Protein 6.5 - 8.1 g/dL 7.0   6.2   Total Bilirubin 0.3 - 1.2 mg/dL 0.9   0.5   Alkaline Phos 38 - 126 U/L 59   65   AST 15 - 41 U/L 17   33   ALT 0 - 44 U/L 14   19    No results found for: "CAN153" Lab Results  Component Value Date   WBC 6.1 05/25/2022   HGB 10.4 (L) 05/25/2022   HCT 34.1 (L) 05/25/2022   MCV 93.2 05/25/2022   PLT 198 05/25/2022   NEUTROABS 3.4 05/25/2022    ASSESSMENT:  Right breast DCIS: - Mammogram (04/21/2022): Highly suspicious 2.4 cm mass in the right breast at 6:00.  0.6 cm mass and a dilated duct in the  lateral areola right breast at 6:00.  No evidence of axillary adenopathy.  No evidence of left breast malignancy. - Biopsy (04/26/2022): Right breast subareolar biopsy with marked duct ectasia with focal papillary epithelial hyperplasia without cytological atypia.  Right breast 6:00: Intraductal/intracystic papillary  carcinoma with linear extent in the longest core 16 mm.  Grade 1.  No evidence of invasion.  ER 100%, PR 100%, Ki-67 10%, HER2 2+ by IHC, negative by FISH. - Lumpectomy (06/22/2022): 3.1 x 2.2 x 1.2 cm DCIS, grade 2, intracystic papillary carcinoma with rare minute foci of microinvasion, margins negative.  ER/PR 100%.  HER2 2+ by IHC and negative by FISH.  Ki-67 10%.  pTis PNX. - Anastrozole started on 07/04/2022.    Social/family history: - She lives at home with her daughter.  Uses rollator for ambulation secondary to arthritis.  She is able to do all her ADLs.  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  Non-smoker. - Sister had metastatic breast cancer in her 85s.   PLAN:  Right breast DCIS (grade 2), ER/PR positive: - We have talked about pathology report in detail which showed rare minute foci of microinvasion. - We talked about starting her on anastrozole 1 mg tablet daily for 5 years to decrease incidence of both DCIS and invasive breast cancer. - We also talked about role of radiation.  Patient is reluctant to consider it. - We discussed the side effects in detail including hot flashes, musculoskeletal symptoms and decreased bone mineral density. - She will start taking anastrozole.  She will come back in 4 months.  For follow-up.  2.  Bone health: - DEXA scan on 05/25/2022 with T score -0.7, normal. - Vitamin D was low at 11.13. - She was told to start taking vitamin D 5000 units daily.  She is not taking on regular basis.  I plan to repeat vitamin D at next visit.  Breast Cancer therapy associated bone loss: I have recommended  calcium, Vitamin D and weight bearing exercises.  Orders placed this encounter:  No orders of the defined types were placed in this encounter.   The patient has a good understanding of the overall plan. She agrees with it. She will call with any problems that may develop before the next visit here.  Derek Jack, MD [facility] [phone]   I, Thana Ates, am acting as a scribe for Dr. Derek Jack.  I, Derek Jack MD, have reviewed the above documentation for accuracy and completeness, and I agree with the above.

## 2022-07-13 ENCOUNTER — Encounter: Payer: Self-pay | Admitting: General Surgery

## 2022-07-13 ENCOUNTER — Ambulatory Visit (INDEPENDENT_AMBULATORY_CARE_PROVIDER_SITE_OTHER): Payer: Medicare HMO | Admitting: General Surgery

## 2022-07-13 VITALS — BP 134/55 | HR 70 | Temp 98.6°F | Resp 14 | Ht 65.0 in | Wt 195.0 lb

## 2022-07-13 DIAGNOSIS — D0581 Other specified type of carcinoma in situ of right breast: Secondary | ICD-10-CM

## 2022-07-13 NOTE — Patient Instructions (Signed)
Scar will continue to soften and glue will come off Follow up as needed

## 2022-07-13 NOTE — Progress Notes (Signed)
[facility]  Patient doing well. No complaints.  BP (!) 134/55   Pulse 70   Temp 98.6 F (37 C) (Oral)   Resp 14   Ht '5\' 5"'$  (1.651 m)   Wt 195 lb (88.5 kg)   SpO2 90%   BMI 32.45 kg/m  Incision healing, no erythema or drainage, some dermabond peeling, ridge on the incision and minimal induration  Patient s/p R partial mastectomy after tag for DCIS with microinvasive on the right. Doing well. Starting on hormone therapy.   Scar will continue to soften and glue will come off Follow up as needed  Follow up with oncology  Future Appointments  Date Time Provider [place]  08/17/2022  7:05 AM [facility] DEVICE REMOTES [facility]  11/03/2022  3:30 PM [facility] LAB [facility] None  11/10/2022  3:30 PM Derek Jack, MD [facility] None  11/16/2022  7:05 AM [facility] DEVICE REMOTES [facility]  02/15/2023  7:05 AM [facility] DEVICE REMOTES [facility]  05/17/2023  7:05 AM [facility] DEVICE REMOTES [facility]  08/16/2023  7:05 AM [facility] DEVICE REMOTES [facility]   Curlene Labrum, MD [facility] [address] [place], [place] [address] [phone] (office)

## 2022-07-24 DIAGNOSIS — I5030 Unspecified diastolic (congestive) heart failure: Secondary | ICD-10-CM | POA: Diagnosis not present

## 2022-07-24 DIAGNOSIS — E785 Hyperlipidemia, unspecified: Secondary | ICD-10-CM | POA: Diagnosis not present

## 2022-07-24 DIAGNOSIS — I110 Hypertensive heart disease with heart failure: Secondary | ICD-10-CM | POA: Diagnosis not present

## 2022-07-24 DIAGNOSIS — E119 Type 2 diabetes mellitus without complications: Secondary | ICD-10-CM | POA: Diagnosis not present

## 2022-07-25 DIAGNOSIS — I5030 Unspecified diastolic (congestive) heart failure: Secondary | ICD-10-CM | POA: Diagnosis not present

## 2022-07-25 DIAGNOSIS — R0600 Dyspnea, unspecified: Secondary | ICD-10-CM | POA: Diagnosis not present

## 2022-07-25 DIAGNOSIS — E119 Type 2 diabetes mellitus without complications: Secondary | ICD-10-CM | POA: Diagnosis not present

## 2022-07-30 IMAGING — MG DIGITAL DIAGNOSTIC BILAT W/ TOMO W/ CAD
8 of 15 series · 8 of 40 positions shown · non-contrast
Comparison: Previous exam(s).

CLINICAL DATA: 81-year-old female presenting for evaluation of a
mass in the right breast identified on a recent CT of the abdomen
and pelvis 03/13/2022. The patient states that she does have a
palpable lump in the right breast.

EXAM:
DIGITAL DIAGNOSTIC BILATERAL MAMMOGRAM WITH TOMOSYNTHESIS AND CAD;
ULTRASOUND RIGHT BREAST LIMITED
TECHNIQUE: Bilateral digital diagnostic mammography and breast tomosynthesis
was performed. The images were evaluated with computer-aided
detection.; Targeted ultrasound examination of the right breast was
performed

[L MLO synth-2D (1 of 2)]
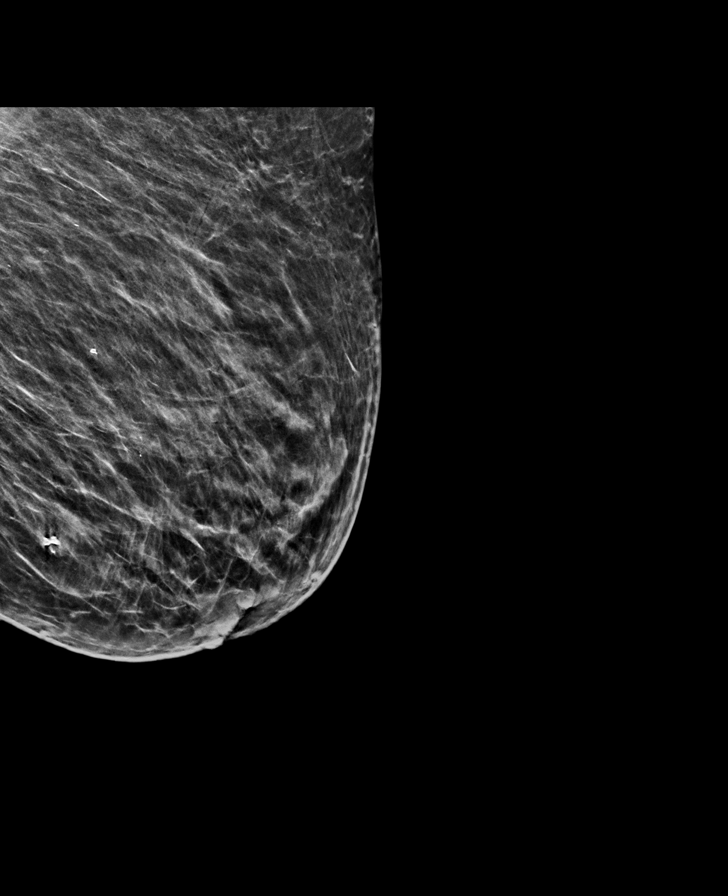

[L CC synth-2D (1 of 2)]
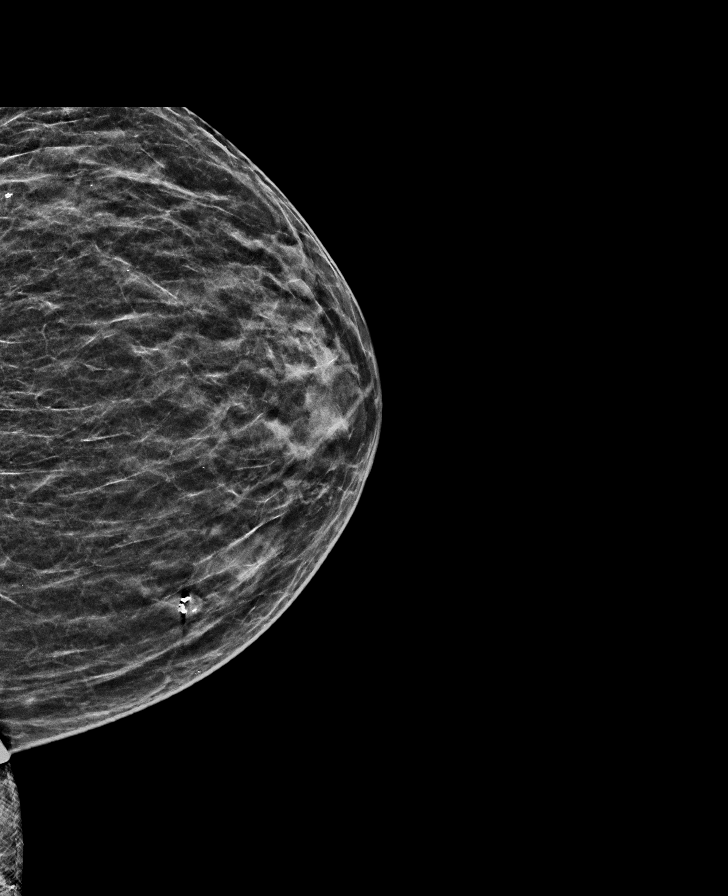

[R CC synth-2D (1 of 2)]
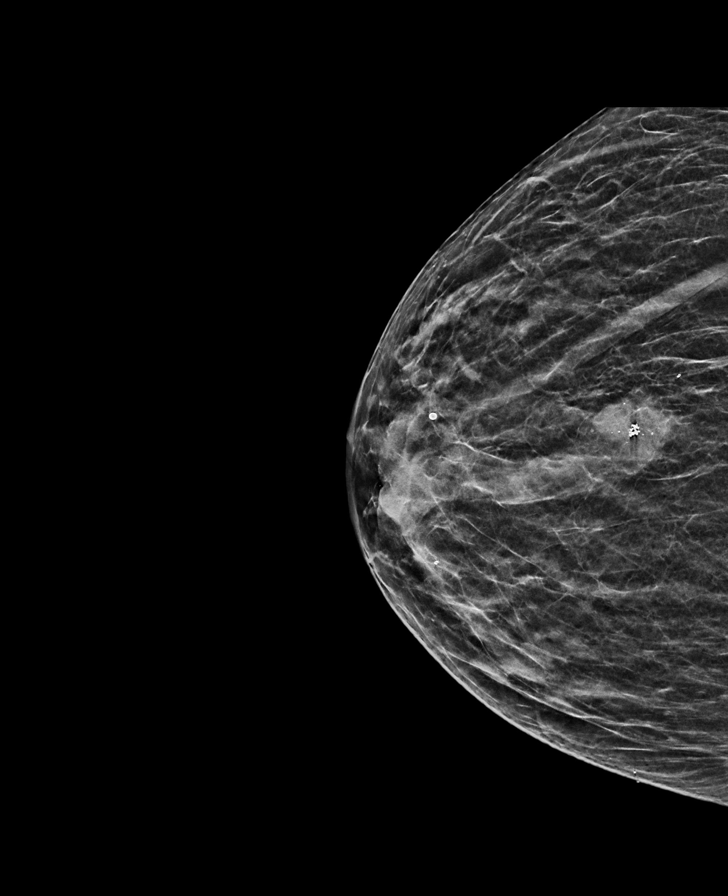

[R CC synth-2D (2 of 2)]
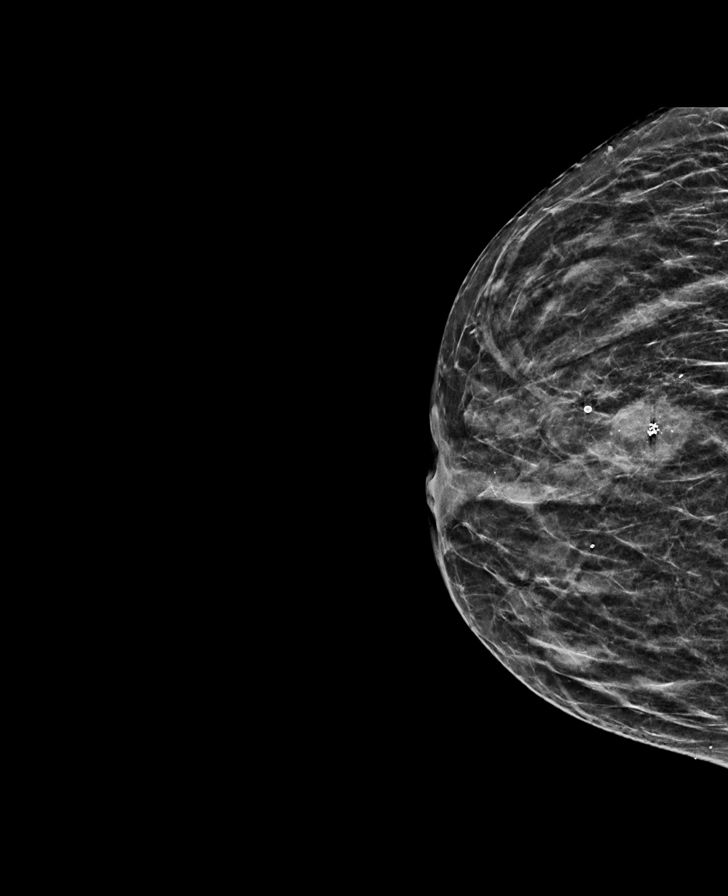

[L MLO synth-2D (2 of 2)]
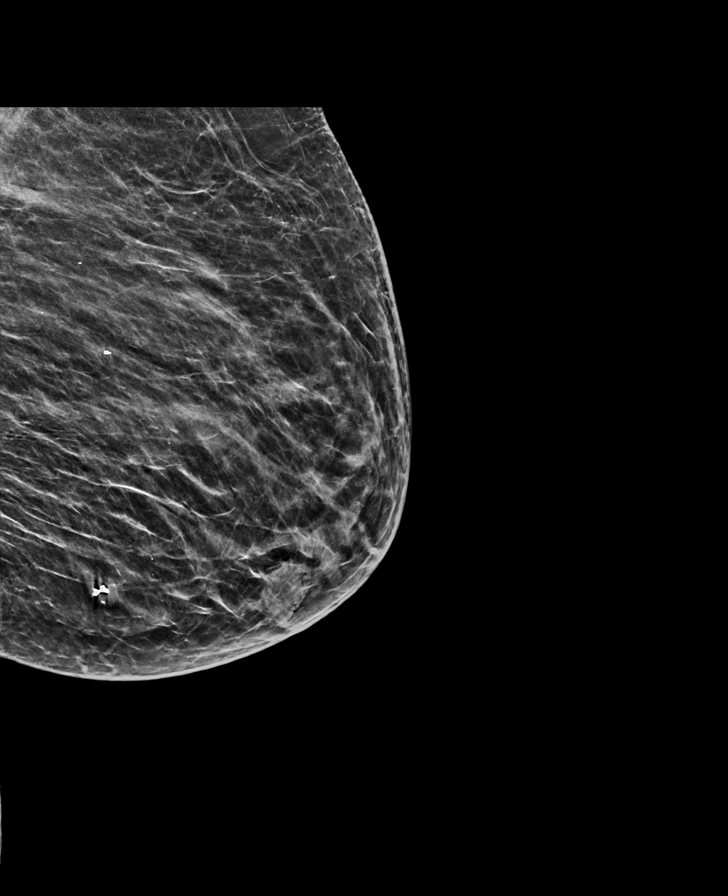

[L CC synth-2D (2 of 2)]
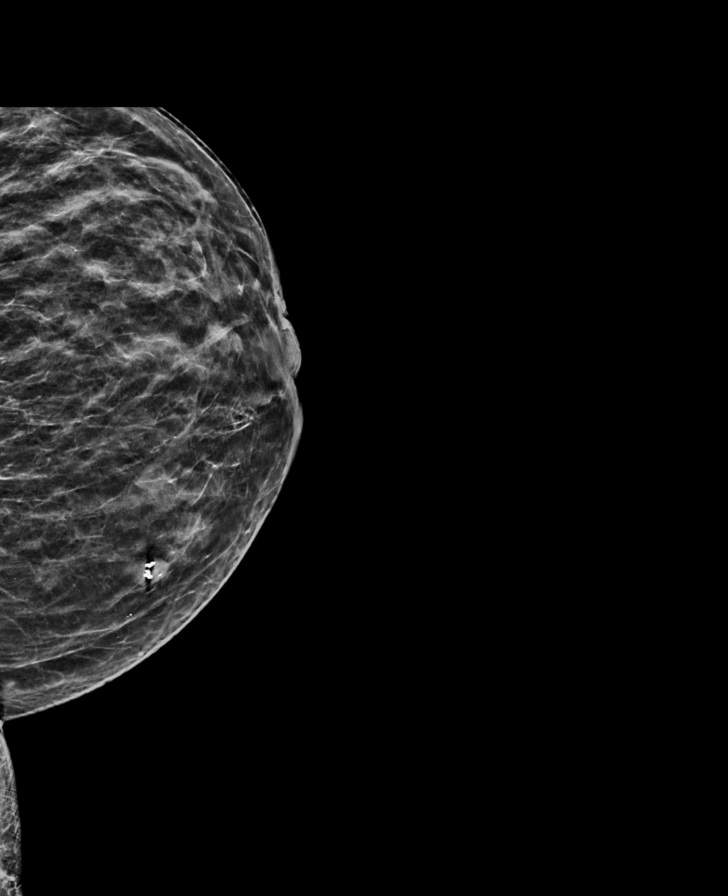

[R TAN synth-2D]
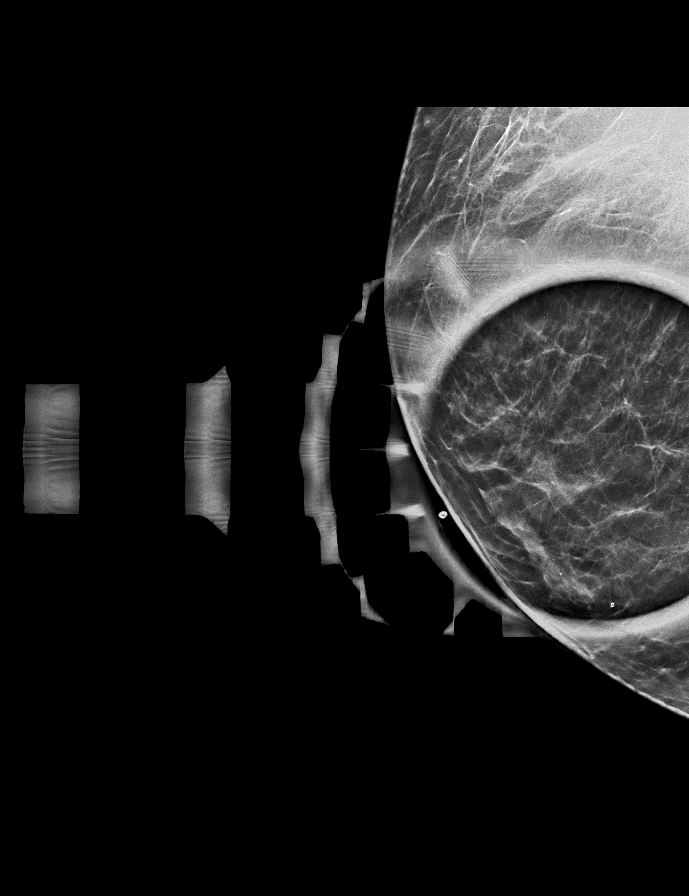

[L CC tomo · tomo slice 32/46.0]
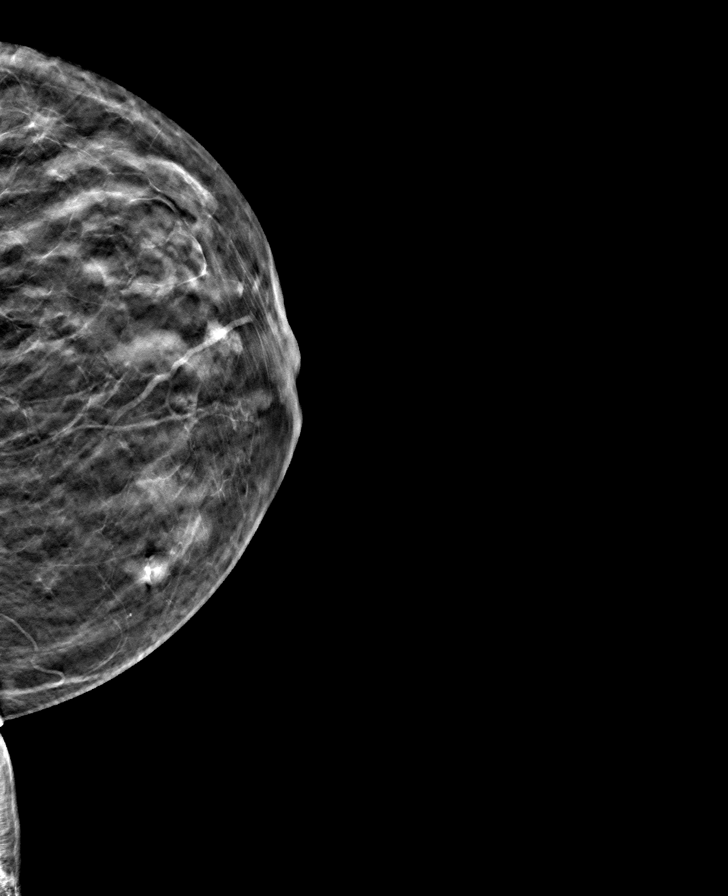

[8 of 40 positions shown; findings below may reference images not displayed]

ACR Breast Density Category b: There are scattered areas of
fibroglandular density.
FINDINGS: In the slightly inferior far posterior right breast there is an
irregular mass with associated coarse internal calcifications. The
mass measures approximately 2.5 cm. There appears to be right nipple
retraction, and new density extending between the nipple and the
mass. The skin marker indicating the palpable lump pointed out by
the patient lies just above the right nipple. There are no definite
masses just inferior to this marker, however she may be palpating
the mass which is deeper within the breast behind the skin marker.
No suspicious calcifications, masses or areas of distortion are seen
in the left breast.

There is a firm palpable mass in the central posterior right breast
on my exam.

Ultrasound targeted to the right breast at 6 o'clock, 2 cm from the
nipple demonstrates an irregular oval hypoechoic mass measuring
x 1.4 x 2.0 cm. There are dilated ducts extending between the mass
and the base of the nipple. In one of the ducts at the base of the
nipple at 6 o'clock, there is a small oval vascular hypoechoic mass
measuring 0.6 x 0.4 x 0.5 cm.

Ultrasound of the right axilla demonstrates normal-appearing lymph
nodes.
IMPRESSION: 1. There is a highly suspicious 2.4 cm mass in the right breast at 6
o'clock. This corresponds in size and location with the mass seen on
the patient's recent CT.

2. There is a 0.6 cm mass in a dilated duct in the retroareolar
right breast at 6 o'clock. This may represent a papilloma versus a
satellite lesion.

3.  No evidence of right axillary lymphadenopathy.

4.  No evidence of left breast malignancy.

RECOMMENDATION:
Ultrasound guided biopsy is recommended for the 2.4 cm right breast
mass, as well as the 0.6 cm mass in the retroareolar right breast.
The patient will schedule the biopsy at her earliest convenience.

I have discussed the findings and recommendations with the patient.
If applicable, a reminder letter will be sent to the patient
regarding the next appointment.

BI-RADS CATEGORY  5: Highly suggestive of malignancy.

## 2022-08-17 ENCOUNTER — Other Ambulatory Visit: Payer: Self-pay | Admitting: Internal Medicine

## 2022-08-17 ENCOUNTER — Ambulatory Visit (INDEPENDENT_AMBULATORY_CARE_PROVIDER_SITE_OTHER): Payer: Medicare HMO

## 2022-08-17 DIAGNOSIS — R609 Edema, unspecified: Secondary | ICD-10-CM

## 2022-08-17 DIAGNOSIS — I442 Atrioventricular block, complete: Secondary | ICD-10-CM

## 2022-08-17 DIAGNOSIS — Z79899 Other long term (current) drug therapy: Secondary | ICD-10-CM

## 2022-08-18 LAB — CUP PACEART REMOTE DEVICE CHECK
Battery Remaining Longevity: 46 mo
Battery Remaining Percentage: 41 %
Battery Voltage: 2.98 V
Brady Statistic AP VP Percent: 24 %
Brady Statistic AP VS Percent: 9.2 %
Brady Statistic AS VP Percent: 49 %
Brady Statistic AS VS Percent: 17 %
Brady Statistic RA Percent Paced: 33 %
Brady Statistic RV Percent Paced: 74 %
Date Time Interrogation Session: 20230823022531
Implantable Lead Implant Date: 20161129
Implantable Lead Implant Date: 20161129
Implantable Lead Location: 753859
Implantable Lead Location: 753860
Implantable Lead Model: 5076
Implantable Pulse Generator Implant Date: 20161129
Lead Channel Impedance Value: 360 Ohm
Lead Channel Impedance Value: 440 Ohm
Lead Channel Pacing Threshold Amplitude: 0.75 V
Lead Channel Pacing Threshold Amplitude: 0.875 V
Lead Channel Pacing Threshold Pulse Width: 0.4 ms
Lead Channel Pacing Threshold Pulse Width: 0.4 ms
Lead Channel Sensing Intrinsic Amplitude: 12 mV
Lead Channel Sensing Intrinsic Amplitude: 4 mV
Lead Channel Setting Pacing Amplitude: 1.875
Lead Channel Setting Pacing Amplitude: 2 V
Lead Channel Setting Pacing Pulse Width: 0.4 ms
Lead Channel Setting Sensing Sensitivity: 2 mV
Pulse Gen Model: 2240
Pulse Gen Serial Number: 7826037

## 2022-08-25 DIAGNOSIS — I110 Hypertensive heart disease with heart failure: Secondary | ICD-10-CM | POA: Diagnosis not present

## 2022-08-25 DIAGNOSIS — I5030 Unspecified diastolic (congestive) heart failure: Secondary | ICD-10-CM | POA: Diagnosis not present

## 2022-08-25 DIAGNOSIS — E119 Type 2 diabetes mellitus without complications: Secondary | ICD-10-CM | POA: Diagnosis not present

## 2022-08-25 DIAGNOSIS — E785 Hyperlipidemia, unspecified: Secondary | ICD-10-CM | POA: Diagnosis not present

## 2022-08-31 DIAGNOSIS — Z794 Long term (current) use of insulin: Secondary | ICD-10-CM | POA: Diagnosis not present

## 2022-08-31 DIAGNOSIS — E119 Type 2 diabetes mellitus without complications: Secondary | ICD-10-CM | POA: Diagnosis not present

## 2022-09-06 DIAGNOSIS — E785 Hyperlipidemia, unspecified: Secondary | ICD-10-CM | POA: Diagnosis not present

## 2022-09-06 DIAGNOSIS — Z86711 Personal history of pulmonary embolism: Secondary | ICD-10-CM | POA: Diagnosis not present

## 2022-09-06 DIAGNOSIS — I10 Essential (primary) hypertension: Secondary | ICD-10-CM | POA: Diagnosis not present

## 2022-09-06 DIAGNOSIS — R809 Proteinuria, unspecified: Secondary | ICD-10-CM | POA: Diagnosis not present

## 2022-09-06 DIAGNOSIS — Z794 Long term (current) use of insulin: Secondary | ICD-10-CM | POA: Diagnosis not present

## 2022-09-06 DIAGNOSIS — E119 Type 2 diabetes mellitus without complications: Secondary | ICD-10-CM | POA: Diagnosis not present

## 2022-09-06 DIAGNOSIS — I5030 Unspecified diastolic (congestive) heart failure: Secondary | ICD-10-CM | POA: Diagnosis not present

## 2022-09-06 DIAGNOSIS — Z23 Encounter for immunization: Secondary | ICD-10-CM | POA: Diagnosis not present

## 2022-09-06 DIAGNOSIS — Z950 Presence of cardiac pacemaker: Secondary | ICD-10-CM | POA: Diagnosis not present

## 2022-09-13 NOTE — Progress Notes (Signed)
Remote pacemaker transmission.   

## 2022-10-05 DIAGNOSIS — E113293 Type 2 diabetes mellitus with mild nonproliferative diabetic retinopathy without macular edema, bilateral: Secondary | ICD-10-CM | POA: Diagnosis not present

## 2022-10-11 ENCOUNTER — Ambulatory Visit: Payer: Medicare HMO | Attending: Internal Medicine | Admitting: Internal Medicine

## 2022-10-11 ENCOUNTER — Encounter: Payer: Self-pay | Admitting: Internal Medicine

## 2022-10-11 VITALS — BP 124/58 | HR 62 | Ht 65.0 in | Wt 195.0 lb

## 2022-10-11 DIAGNOSIS — I442 Atrioventricular block, complete: Secondary | ICD-10-CM

## 2022-10-11 LAB — CUP PACEART INCLINIC DEVICE CHECK
Battery Remaining Longevity: 46 mo
Battery Voltage: 2.96 V
Brady Statistic RA Percent Paced: 36 %
Brady Statistic RV Percent Paced: 77 %
Date Time Interrogation Session: 20231017160938
Implantable Lead Implant Date: 20161129
Implantable Lead Implant Date: 20161129
Implantable Lead Location: 753859
Implantable Lead Location: 753860
Implantable Lead Model: 5076
Implantable Pulse Generator Implant Date: 20161129
Lead Channel Impedance Value: 362.5 Ohm
Lead Channel Impedance Value: 487.5 Ohm
Lead Channel Pacing Threshold Amplitude: 0.75 V
Lead Channel Pacing Threshold Amplitude: 0.75 V
Lead Channel Pacing Threshold Amplitude: 0.875 V
Lead Channel Pacing Threshold Pulse Width: 0.4 ms
Lead Channel Pacing Threshold Pulse Width: 0.4 ms
Lead Channel Pacing Threshold Pulse Width: 0.4 ms
Lead Channel Sensing Intrinsic Amplitude: 12 mV
Lead Channel Sensing Intrinsic Amplitude: 4.3 mV
Lead Channel Setting Pacing Amplitude: 1.875
Lead Channel Setting Pacing Amplitude: 2 V
Lead Channel Setting Pacing Pulse Width: 0.4 ms
Lead Channel Setting Sensing Sensitivity: 4 mV
Pulse Gen Model: 2240
Pulse Gen Serial Number: 7826037

## 2022-10-11 NOTE — Patient Instructions (Signed)
Medication Instructions:  Your physician recommends that you continue on your current medications as directed. Please refer to the Current Medication list given to you today.  *If you need a refill on your cardiac medications before your next appointment, please call your pharmacy*   Lab Work: NONE   If you have labs (blood work) drawn today and your tests are completely normal, you will receive your results only by: MyChart Message (if you have MyChart) OR A paper copy in the mail If you have any lab test that is abnormal or we need to change your treatment, we will call you to review the results.   Testing/Procedures: NONE    Follow-Up: At [facility], you and your health needs are our priority.  As part of our continuing mission to provide you with exceptional heart care, we have created designated Provider Care Teams.  These Care Teams include your primary Cardiologist (physician) and Advanced Practice Providers (APPs -  Physician Assistants and Nurse Practitioners) who all work together to provide you with the care you need, when you need it.  We recommend signing up for the patient portal called "MyChart".  Sign up information is provided on this After Visit Summary.  MyChart is used to connect with patients for Virtual Visits (Telemedicine).  Patients are able to view lab/test results, encounter notes, upcoming appointments, etc.  Non-urgent messages can be sent to your provider as well.   To learn more about what you can do with MyChart, go to [email or web address].    Your next appointment:   1 year(s)  The format for your next appointment:   In Person  Provider:   Gregg Taylor, MD    Other Instructions Thank you for choosing [facility]!    Important Information About Sugar       

## 2022-10-11 NOTE — Progress Notes (Signed)
HPI Natalie Castro presents today for ongoing evaluation and management of her PPM. She is a pleasant 81 yo woman with a h/o HTN and DM who developed syncope and sob and was found to have CHB, and underwent PPM insertion almost 7 years ago.  She was hospitalized remotely with pulmonary emboli. No chest pain. No bleeding. She admits to dietary indiscretion. She has occaisional palpitations. She was hospitalized with cholecystitis and had a drain placed in the setting of E'coli bacteremia and was treated with anti-biotics. She then underwent lap chole. She feels better. She has not had more fever,chills, or night sweats. She has rare palpitations. Allergies  Allergen Reactions   Penicillins Hives, Itching and Other (See Comments)    Has tolerated ceftriaxone 7/22 Has patient had a PCN reaction causing immediate rash, facial/tongue/throat swelling, SOB or lightheadedness with hypotension: No Has patient had a PCN reaction causing severe rash involving mucus membranes or skin necrosis: Yes Has patient had a PCN reaction that required hospitalization No Has patient had a PCN reaction occurring within the last 10 years: No If all of the above answers are "NO", then may proceed with Cephalosporin use.      Current Outpatient Medications  Medication Sig Dispense Refill   acetaminophen (TYLENOL) 325 MG tablet Take 650 mg by mouth every 6 (six) hours as needed for moderate pain.     anastrozole (ARIMIDEX) 1 MG tablet Take 1 tablet (1 mg total) by mouth daily. 90 tablet 3   apixaban (ELIQUIS) 5 MG TABS tablet Take 1 tablet (5 mg total) by mouth 2 (two) times daily. 60 tablet 0   atorvastatin (LIPITOR) 40 MG tablet Take 1 tablet (40 mg total) by mouth daily. (Patient taking differently: Take 40 mg by mouth at bedtime.) 30 tablet 0   Cholecalciferol (VITAMIN D) 125 MCG (5000 UT) CAPS Take 5,000 Units by mouth daily. 30 capsule 11   cholecalciferol (VITAMIN D3) 25 MCG (1000 UNIT) tablet Take 4,000 Units  by mouth daily.     furosemide (LASIX) 40 MG tablet Take 1 tablet (40 mg total) by mouth daily. 30 tablet 0   glucose blood test strip Use to check blood glucose fasting , before lunch and dinner and after your largest meal 120 each 0   Insulin Pen Needle (BD AUTOSHIELD DUO) 30G X 5 MM MISC 30 gauge x 3/16" (pen needle,diabetic dual safty)     Lancets (ONETOUCH ULTRASOFT) lancets Use to check glucose 4 x daily 120 each 0   LANTUS SOLOSTAR 100 UNIT/ML Solostar Pen Inject 10 Units into the skin at bedtime. (Patient taking differently: Inject 25 Units into the skin at bedtime.) 15 mL 0   losartan (COZAAR) 100 MG tablet Take 100 mg by mouth daily.     metFORMIN (GLUCOPHAGE) 1000 MG tablet TAKE (1) TABLET BY MOUTH TWICE DAILY. 60 tablet 0   metoprolol succinate (TOPROL-XL) 50 MG 24 hr tablet Take 1 tablet (50 mg total) by mouth daily. Take with or immediately following a meal. (Patient taking differently: Take 50 mg by mouth in the morning and at bedtime. Take with or immediately following a meal.) 60 tablet 0   nitroGLYCERIN (NITROSTAT) 0.4 MG SL tablet Place 1 tablet (0.4 mg total) under the tongue every 5 (five) minutes as needed for chest pain. 25 tablet 0   vitamin B-12 (CYANOCOBALAMIN) 500 MCG tablet Take 1 tablet (500 mcg total) by mouth daily. (Patient taking differently: Take 500 mcg by mouth 2 (two) times  a week.)     potassium chloride (KLOR-CON) 10 MEQ tablet Take 10 mEq by mouth daily.     No current facility-administered medications for this visit.     Past Medical History:  Diagnosis Date   Acid reflux    Arthritis    Colon adenomas    AGE 22   Complete heart block (HCC)    STJ PPM Dr. [name] 11/24/15   Dyspnea    Essential hypertension    History of pulmonary embolism 2017   unprovoked, long term anticoag with apixaban    Hyperlipidemia    Myoview 03/2021    Myoview 4/22: EF 50, no infarct or ischemia; low risk   Presence of permanent cardiac pacemaker    Type 2 diabetes  mellitus ([place])     ROS:   All systems reviewed and negative except as noted in the HPI.   Past Surgical History:  Procedure Laterality Date   CHOLECYSTECTOMY N/A 03/16/2022   Procedure: LAPAROSCOPIC CHOLECYSTECTOMY;  Surgeon: Virl Cagey, MD;  Location: [facility];  Service: General;  Laterality: N/A;   COLONOSCOPY     3 SIMPLE ADENOMAS, AGE 9   COLONOSCOPY N/A 05/24/2018   Procedure: COLONOSCOPY;  Surgeon: Danie Binder, MD;  Location: [facility];  Service: Endoscopy;  Laterality: N/A;  1:00pm   ENDOSCOPIC RETROGRADE CHOLANGIOPANCREATOGRAPHY (ERCP) WITH PROPOFOL N/A 03/15/2022   Procedure: ENDOSCOPIC RETROGRADE CHOLANGIOPANCREATOGRAPHY (ERCP) WITH PROPOFOL;  Surgeon: Rogene Houston, MD;  Location: [facility];  Service: Endoscopy;  Laterality: N/A;  with sphincterotomy and stone retrieval   EP IMPLANTABLE DEVICE N/A 11/24/2015   Procedure: Pacemaker Implant;  Surgeon: [name], MD;  Location: [facility];  Service: Cardiovascular;  Laterality: N/A;   IR EXCHANGE BILIARY DRAIN  07/27/2021   IR EXCHANGE BILIARY DRAIN  08/23/2021   IR EXCHANGE BILIARY DRAIN  02/25/2022   IR PERC CHOLECYSTOSTOMY  07/08/2021   IR PERC CHOLECYSTOSTOMY  11/04/2021   IR RADIOLOGIST EVAL & MGMT  09/22/2021   JOINT REPLACEMENT     knees bilat.   LEFT HEART CATH AND CORONARY ANGIOGRAPHY N/A 07/02/2021   Procedure: LEFT HEART CATH AND CORONARY ANGIOGRAPHY;  Surgeon: Jettie Booze, MD;  Location: [facility];  Service: Cardiovascular;  Laterality: N/A;   POLYPECTOMY  05/24/2018   Procedure: POLYPECTOMY;  Surgeon: Danie Binder, MD;  Location: [facility];  Service: Endoscopy;;  ascending and hepatic flexure, transverse   REMOVAL OF STONES N/A 03/15/2022   Procedure: STONE AND SLUDGE EXTRACTION;  Surgeon: Rogene Houston, MD;  Location: [facility];  Service: Endoscopy;  Laterality: N/A;   TEE WITHOUT CARDIOVERSION N/A 08/27/2021   Procedure: TRANSESOPHAGEAL ECHOCARDIOGRAM (TEE);  Surgeon: Pixie Casino, MD;  Location: [facility] ENDOSCOPY;  Service: Cardiovascular;  Laterality: N/A;   TUBAL LIGATION       Family History  Problem Relation Age of Onset   Heart failure Mother    Hypertension Mother    Heart failure Sister    Hypertension Father    Cancer Sister    Dementia Sister    Colon cancer Neg Hx    Colon polyps Neg Hx      Social History   Socioeconomic History   Marital status: Widowed    Spouse name: Not on file   Number of children: Not on file   Years of education: Not on file   Highest education level: Not on file  Occupational History   Occupation: retired  Tobacco Use  Smoking status: Never   Smokeless tobacco: Never  Vaping Use   Vaping Use: Never used  Substance and Sexual Activity   Alcohol use: No    Alcohol/week: 0.0 standard drinks of alcohol   Drug use: No   Sexual activity: Yes    Birth control/protection: Post-menopausal  Other Topics Concern   Not on file  Social History Narrative   MARRIED FOR 70 YRS. 5 KIDS: #4 PRESENT TODAY(AGE 15)   Social Determinants of Health   Financial Resource Strain: Not on file  Food Insecurity: Not on file  Transportation Needs: Not on file  Physical Activity: Not on file  Stress: Not on file  Social Connections: Not on file  Intimate Partner Violence: Not on file     BP (!) 124/58   Pulse 62   Ht '5\' 5"'$  (1.651 m)   Wt 195 lb (88.5 kg)   SpO2 97%   BMI 32.45 kg/m   Physical Exam:  Well appearing NAD HEENT: Unremarkable Neck:  No JVD, no thyromegally Lymphatics:  No adenopathy Back:  No CVA tenderness Lungs:  Clear HEART:  Regular rate rhythm, no murmurs, no rubs, no clicks Abd:  soft, positive bowel sounds, no organomegally, no rebound, no guarding Ext:  2 plus pulses, no edema, no cyanosis, no clubbing Skin:  No rashes no nodules Neuro:  CN II through XII intact, motor grossly intact  DEVICE  Normal device function.  See PaceArt for details.   Assess/Plan:   PPM - her St. Jude  DDD PM has been working normally.  Volume overload - she is much improved on lasix.  HTN - her bp is elevated but always worse in the doctors office. She is encouraged to avoid salty foods. Cholecystitis - she is s/p Lap chole and doing well.    Carleene Overlie Gerold Sar,MD

## 2022-11-03 ENCOUNTER — Inpatient Hospital Stay: Payer: Medicare HMO | Attending: Hematology

## 2022-11-03 DIAGNOSIS — D0581 Other specified type of carcinoma in situ of right breast: Secondary | ICD-10-CM

## 2022-11-03 DIAGNOSIS — Z79811 Long term (current) use of aromatase inhibitors: Secondary | ICD-10-CM | POA: Diagnosis not present

## 2022-11-03 DIAGNOSIS — D0511 Intraductal carcinoma in situ of right breast: Secondary | ICD-10-CM | POA: Insufficient documentation

## 2022-11-03 DIAGNOSIS — Z170 Estrogen receptor positive status [ER+]: Secondary | ICD-10-CM | POA: Diagnosis not present

## 2022-11-03 LAB — CBC WITH DIFFERENTIAL/PLATELET
Abs Immature Granulocytes: 0.02 10*3/uL (ref 0.00–0.07)
Basophils Absolute: 0 10*3/uL (ref 0.0–0.1)
Basophils Relative: 1 %
Eosinophils Absolute: 0.1 10*3/uL (ref 0.0–0.5)
Eosinophils Relative: 2 %
HCT: 37 % (ref 36.0–46.0)
Hemoglobin: 11.8 g/dL — ABNORMAL LOW (ref 12.0–15.0)
Immature Granulocytes: 0 %
Lymphocytes Relative: 40 %
Lymphs Abs: 2.7 10*3/uL (ref 0.7–4.0)
MCH: 28.7 pg (ref 26.0–34.0)
MCHC: 31.9 g/dL (ref 30.0–36.0)
MCV: 90 fL (ref 80.0–100.0)
Monocytes Absolute: 0.6 10*3/uL (ref 0.1–1.0)
Monocytes Relative: 9 %
Neutro Abs: 3.3 10*3/uL (ref 1.7–7.7)
Neutrophils Relative %: 48 %
Platelets: 195 10*3/uL (ref 150–400)
RBC: 4.11 MIL/uL (ref 3.87–5.11)
RDW: 14.6 % (ref 11.5–15.5)
WBC: 6.8 10*3/uL (ref 4.0–10.5)
nRBC: 0 % (ref 0.0–0.2)

## 2022-11-03 LAB — COMPREHENSIVE METABOLIC PANEL
ALT: 10 U/L (ref 0–44)
AST: 16 U/L (ref 15–41)
Albumin: 3.1 g/dL — ABNORMAL LOW (ref 3.5–5.0)
Alkaline Phosphatase: 69 U/L (ref 38–126)
Anion gap: 9 (ref 5–15)
BUN: 21 mg/dL (ref 8–23)
CO2: 26 mmol/L (ref 22–32)
Calcium: 9.1 mg/dL (ref 8.9–10.3)
Chloride: 105 mmol/L (ref 98–111)
Creatinine, Ser: 0.94 mg/dL (ref 0.44–1.00)
GFR, Estimated: >60 mL/min (ref >60)
Glucose, Bld: 177 mg/dL — ABNORMAL HIGH (ref 70–99)
Potassium: 3.5 mmol/L (ref 3.5–5.1)
Sodium: 140 mmol/L (ref 135–145)
Total Bilirubin: 0.5 mg/dL (ref 0.3–1.2)
Total Protein: 7.3 g/dL (ref 6.5–8.1)

## 2022-11-03 LAB — VITAMIN D 25 HYDROXY (VIT D DEFICIENCY, FRACTURES): Vit D, 25-Hydroxy: 63.04 ng/mL (ref 30–100)

## 2022-11-10 ENCOUNTER — Inpatient Hospital Stay: Payer: Medicare HMO | Admitting: Hematology

## 2022-11-10 VITALS — BP 127/61 | HR 60 | Temp 99.1°F | Resp 18 | Ht 65.0 in | Wt 195.5 lb

## 2022-11-10 DIAGNOSIS — D0581 Other specified type of carcinoma in situ of right breast: Secondary | ICD-10-CM | POA: Diagnosis not present

## 2022-11-10 DIAGNOSIS — D0511 Intraductal carcinoma in situ of right breast: Secondary | ICD-10-CM | POA: Diagnosis not present

## 2022-11-10 DIAGNOSIS — Z170 Estrogen receptor positive status [ER+]: Secondary | ICD-10-CM | POA: Diagnosis not present

## 2022-11-10 DIAGNOSIS — Z79811 Long term (current) use of aromatase inhibitors: Secondary | ICD-10-CM | POA: Diagnosis not present

## 2022-11-10 NOTE — Patient Instructions (Addendum)
[facility]  Discharge Instructions  You were seen and examined today by Dr. Delton Coombes.  Dr. Delton Coombes discussed your most recent lab work which revealed that everything looks good except your protein is slightly low.   Increase your protein intake like meats, cheese and beans. Continue taking the Vitamin D and Calcium along with your Anastrozole.  Follow-up as scheduled in 6 months with labs and mammogram.    Thank you for choosing [facility] to provide your oncology and hematology care.   To afford each patient quality time with our provider, please arrive at least 15 minutes before your scheduled appointment time. You may need to reschedule your appointment if you arrive late (10 or more minutes). Arriving late affects you and other patients whose appointments are after yours.  Also, if you miss three or more appointments without notifying the office, you may be dismissed from the clinic at the provider's discretion.    Again, thank you for choosing [facility].  Our hope is that these requests will decrease the amount of time that you wait before being seen by our physicians.   If you have a lab appointment with the [facility] please come in thru the Main Entrance and check in at the main information desk.           _____________________________________________________________  Should you have questions after your visit to [facility], please contact our office at [phone] and follow the prompts.  Our office hours are 8:00 a.m. to 4:30 p.m. Monday - Thursday and 8:00 a.m. to 2:30 p.m. Friday.  Please note that voicemails left after 4:00 p.m. may not be returned until the following business day.  We are closed weekends and all major holidays.  You do have access to a nurse 24-7, just call the main number to the clinic [phone] and do not press any options, hold on the line and a nurse will  answer the phone.    For prescription refill requests, have your pharmacy contact our office and allow 72 hours.    Masks are optional in the cancer centers. If you would like for your care team to wear a mask while they are taking care of you, please let them know. You may have one support person who is at least 80 years old accompany you for your appointments.

## 2022-11-10 NOTE — Progress Notes (Signed)
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 is a 81 y.o. female seen today for follow-up of right breast DCIS.  She is accompanied by her daughter.  She is tolerating anastrozole very well.  Has some hot flashes but they are tolerable.  Denies any musculoskeletal symptoms.  REVIEW OF SYSTEMS:   Review of Systems  Endocrine: Positive for hot flashes.  All other systems reviewed and are negative.   I have reviewed the past medical history, past surgical history, social history and family history with the patient and they are unchanged from previous note.   ALLERGIES:   is allergic to penicillins.   MEDICATIONS:  Current Outpatient Medications  Medication Sig Dispense Refill   acetaminophen (TYLENOL) 325 MG tablet Take 650 mg by mouth every 6 (six) hours as needed for moderate pain.     anastrozole (ARIMIDEX) 1 MG tablet Take 1 tablet (1 mg total) by mouth daily. 90 tablet 3   apixaban (ELIQUIS) 5 MG TABS tablet Take 1 tablet (5  mg total) by mouth 2 (two) times daily. 60 tablet 0   atorvastatin (LIPITOR) 40 MG tablet Take 1 tablet (40 mg total) by mouth daily. (Patient taking differently: Take 40 mg by mouth at bedtime.) 30 tablet 0   Cholecalciferol (VITAMIN D) 125 MCG (5000 UT) CAPS Take 5,000 Units by mouth daily. 30 capsule 11   cholecalciferol (VITAMIN D3) 25 MCG (1000 UNIT) tablet Take 4,000 Units by mouth daily.     furosemide (LASIX) 40 MG tablet Take 1 tablet (40 mg total) by mouth daily. 30 tablet 0   glucose blood test strip Use to check blood glucose fasting , before lunch and dinner and after your largest meal 120 each 0   Insulin Pen Needle (BD AUTOSHIELD DUO) 30G X 5 MM MISC 30 gauge x 3/16" (pen needle,diabetic dual safty)     Lancets (ONETOUCH ULTRASOFT) lancets Use to check glucose 4 x daily 120 each 0   LANTUS SOLOSTAR 100 UNIT/ML Solostar Pen Inject 10 Units into the skin at bedtime. (Patient taking differently: Inject 25 Units into the skin at bedtime.) 15 mL 0   losartan (COZAAR) 100 MG tablet Take 100 mg by mouth daily.     metFORMIN (GLUCOPHAGE) 1000 MG tablet TAKE (1) TABLET BY MOUTH TWICE DAILY. 60 tablet 0   metoprolol succinate (TOPROL-XL) 50 MG 24 hr tablet Take 50 mg by mouth in the morning and at bedtime. Take with or immediately following a meal.     potassium chloride (KLOR-CON) 10 MEQ tablet Take 10 mEq by mouth daily.  vitamin B-12 (CYANOCOBALAMIN) 500 MCG tablet Take 1 tablet (500 mcg total) by mouth daily. (Patient taking differently: Take 500 mcg by mouth 2 (two) times a week.)     nitroGLYCERIN (NITROSTAT) 0.4 MG SL tablet Place 1 tablet (0.4 mg total) under the tongue every 5 (five) minutes as needed for chest pain. 25 tablet 0   No current facility-administered medications for this visit.     PHYSICAL EXAMINATION: Performance status (ECOG): 1 - Symptomatic but completely ambulatory  Vitals:   11/10/22 1524  BP: 127/61  Pulse: 60  Resp: 18  Temp: 99.1 F (37.3 C)   SpO2: 98%   Wt Readings from Last 3 Encounters:  11/10/22 195 lb 8.8 oz (88.7 kg)  10/11/22 195 lb (88.5 kg)  07/13/22 195 lb (88.5 kg)   Physical Exam Vitals reviewed.  Constitutional:      Appearance: Normal appearance. She is obese.     Comments: In wheelchair  Cardiovascular:     Rate and Rhythm: Normal rate and regular rhythm.     Pulses: Normal pulses.     Heart sounds: Normal heart sounds.  Pulmonary:     Effort: Pulmonary effort is normal.     Breath sounds: Normal breath sounds.  Neurological:     General: No focal deficit present.     Mental Status: She is alert and oriented to person, place, and time.  Psychiatric:        Mood and Affect: Mood normal.        Behavior: Behavior normal.    Breast Exam Chaperone: Thana Ates     LABORATORY DATA:  I have reviewed the data as listed    Latest Ref Rng & Units 11/03/2022    4:07 PM 05/25/2022    1:50 PM 03/18/2022    4:10 AM  CMP  Glucose 70 - 99 mg/dL 177  94  205   BUN 8 - 23 mg/dL _0 Creatinine 0.44 - 1.00 mg/dL 0.94  0.77  0.84   Sodium 135 - 145 mmol/L 140  137  139   Potassium 3.5 - 5.1 mmol/L 3.5  3.4  3.9   Chloride 98 - 111 mmol/L 105  107  108   CO2 22 - 32 mmol/L _1 Calcium 8.9 - 10.3 mg/dL 9.1  8.3  7.9   Total Protein 6.5 - 8.1 g/dL 7.3  7.0    Total Bilirubin 0.3 - 1.2 mg/dL 0.5  0.9    Alkaline Phos 38 - 126 U/L 69  59    AST 15 - 41 U/L 16  17    ALT 0 - 44 U/L 10  14     No results found for: "CAN153" Lab Results  Component Value Date   WBC 6.8 11/03/2022   HGB 11.8 (L) 11/03/2022   HCT 37.0 11/03/2022   MCV 90.0 11/03/2022   PLT 195 11/03/2022   NEUTROABS 3.3 11/03/2022    ASSESSMENT:  Right breast DCIS: - Mammogram (04/21/2022): Highly suspicious 2.4 cm mass in the right breast at 6:00.  0.6 cm mass and a dilated duct in the lateral areola right breast at 6:00.  No evidence of axillary adenopathy.  No evidence of left breast malignancy. - Biopsy (04/26/2022):  Right breast subareolar biopsy with marked duct ectasia with focal papillary epithelial hyperplasia without cytological atypia.  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  Grade  1.  No evidence of invasion.  ER 100%, PR 100%, Ki-67 10%, HER2 2+ by IHC, negative by FISH. - Lumpectomy (06/22/2022): 3.1 x 2.2 x 1.2 cm DCIS, grade 2, intracystic papillary carcinoma with rare minute foci of microinvasion, margins negative.  ER/PR 100%.  HER2 2+ by IHC and negative by FISH.  Ki-67 10%.  pTis PNX. - Anastrozole started on 07/04/2022.  She was reluctant to consider radiation.    Social/family history: - She lives at home with her daughter.  Uses rollator for ambulation secondary to arthritis.  She is able to do all her ADLs.  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  Non-smoker. - Sister had metastatic breast cancer in her 3s.   PLAN:  Right breast DCIS (grade 2), ER/PR positive: -She is tolerating anastrozole very well. - Labs from 11/03/2022 with normal LFTs and CBC. - We will schedule bilateral mammogram on 04/22/2023. - RTC 6 months for follow-up with labs.  2.  Bone health (DEXA 05/25/2022 T score -0.7): - Vitamin D level is 63. - Continue vitamin D 5000 units daily.  Breast Cancer therapy associated bone loss: I have recommended calcium, Vitamin D and weight bearing exercises.  Orders placed this encounter:  No orders of the defined types were placed in this encounter.   The patient has a good understanding of the overall plan. She agrees with it. She will call with any problems that may develop before the next visit here.  Derek Jack, MD [facility] [phone]

## 2022-11-16 ENCOUNTER — Ambulatory Visit (INDEPENDENT_AMBULATORY_CARE_PROVIDER_SITE_OTHER): Payer: Medicare Other

## 2022-11-16 DIAGNOSIS — I442 Atrioventricular block, complete: Secondary | ICD-10-CM

## 2022-11-16 LAB — CUP PACEART REMOTE DEVICE CHECK
Battery Remaining Longevity: 42 mo
Battery Remaining Percentage: 38 %
Battery Voltage: 2.96 V
Brady Statistic AP VP Percent: 65 %
Brady Statistic AP VS Percent: 1 %
Brady Statistic AS VP Percent: 35 %
Brady Statistic AS VS Percent: 1 %
Brady Statistic RA Percent Paced: 65 %
Brady Statistic RV Percent Paced: 99 %
Date Time Interrogation Session: 20231122020021
Implantable Lead Connection Status: 753985
Implantable Lead Connection Status: 753985
Implantable Lead Implant Date: 20161129
Implantable Lead Implant Date: 20161129
Implantable Lead Location: 753859
Implantable Lead Location: 753860
Implantable Lead Model: 5076
Implantable Pulse Generator Implant Date: 20161129
Lead Channel Impedance Value: 380 Ohm
Lead Channel Impedance Value: 480 Ohm
Lead Channel Pacing Threshold Amplitude: 0.75 V
Lead Channel Pacing Threshold Amplitude: 0.75 V
Lead Channel Pacing Threshold Pulse Width: 0.4 ms
Lead Channel Pacing Threshold Pulse Width: 0.4 ms
Lead Channel Sensing Intrinsic Amplitude: 12 mV
Lead Channel Sensing Intrinsic Amplitude: 3.7 mV
Lead Channel Setting Pacing Amplitude: 1.75 V
Lead Channel Setting Pacing Amplitude: 2 V
Lead Channel Setting Pacing Pulse Width: 0.4 ms
Lead Channel Setting Sensing Sensitivity: 4 mV
Pulse Gen Model: 2240
Pulse Gen Serial Number: 7826037

## 2022-11-22 ENCOUNTER — Other Ambulatory Visit: Payer: Self-pay

## 2022-11-22 DIAGNOSIS — I442 Atrioventricular block, complete: Secondary | ICD-10-CM

## 2022-11-22 MED ORDER — METOPROLOL SUCCINATE ER 50 MG PO TB24: 50.0000 mg | ORAL_TABLET | Freq: Two times a day (BID) | ORAL | 3 refills | Status: DC

## 2022-12-07 NOTE — Progress Notes (Signed)
Remote pacemaker transmission.   

## 2023-01-04 DIAGNOSIS — E119 Type 2 diabetes mellitus without complications: Secondary | ICD-10-CM | POA: Diagnosis not present

## 2023-01-04 DIAGNOSIS — Z794 Long term (current) use of insulin: Secondary | ICD-10-CM | POA: Diagnosis not present

## 2023-01-10 DIAGNOSIS — E119 Type 2 diabetes mellitus without complications: Secondary | ICD-10-CM | POA: Diagnosis not present

## 2023-01-10 DIAGNOSIS — Z950 Presence of cardiac pacemaker: Secondary | ICD-10-CM | POA: Diagnosis not present

## 2023-01-10 DIAGNOSIS — I110 Hypertensive heart disease with heart failure: Secondary | ICD-10-CM | POA: Diagnosis not present

## 2023-01-10 DIAGNOSIS — Z853 Personal history of malignant neoplasm of breast: Secondary | ICD-10-CM | POA: Diagnosis not present

## 2023-01-10 DIAGNOSIS — Z86711 Personal history of pulmonary embolism: Secondary | ICD-10-CM | POA: Diagnosis not present

## 2023-01-10 DIAGNOSIS — I10 Essential (primary) hypertension: Secondary | ICD-10-CM | POA: Diagnosis not present

## 2023-01-10 DIAGNOSIS — E785 Hyperlipidemia, unspecified: Secondary | ICD-10-CM | POA: Diagnosis not present

## 2023-01-10 DIAGNOSIS — Z0000 Encounter for general adult medical examination without abnormal findings: Secondary | ICD-10-CM | POA: Diagnosis not present

## 2023-01-10 DIAGNOSIS — I5030 Unspecified diastolic (congestive) heart failure: Secondary | ICD-10-CM | POA: Diagnosis not present

## 2023-02-15 ENCOUNTER — Ambulatory Visit (INDEPENDENT_AMBULATORY_CARE_PROVIDER_SITE_OTHER): Payer: Medicare Other

## 2023-02-15 DIAGNOSIS — I442 Atrioventricular block, complete: Secondary | ICD-10-CM

## 2023-02-15 LAB — CUP PACEART REMOTE DEVICE CHECK
Battery Remaining Longevity: 40 mo
Battery Remaining Percentage: 36 %
Battery Voltage: 2.96 V
Brady Statistic AP VP Percent: 64 %
Brady Statistic AP VS Percent: 1 %
Brady Statistic AS VP Percent: 36 %
Brady Statistic AS VS Percent: 1 %
Brady Statistic RA Percent Paced: 64 %
Brady Statistic RV Percent Paced: 99 %
Date Time Interrogation Session: 20240221020027
Implantable Lead Connection Status: 753985
Implantable Lead Connection Status: 753985
Implantable Lead Implant Date: 20161129
Implantable Lead Implant Date: 20161129
Implantable Lead Location: 753859
Implantable Lead Location: 753860
Implantable Lead Model: 5076
Implantable Pulse Generator Implant Date: 20161129
Lead Channel Impedance Value: 390 Ohm
Lead Channel Impedance Value: 510 Ohm
Lead Channel Pacing Threshold Amplitude: 0.75 V
Lead Channel Pacing Threshold Amplitude: 0.875 V
Lead Channel Pacing Threshold Pulse Width: 0.4 ms
Lead Channel Pacing Threshold Pulse Width: 0.4 ms
Lead Channel Sensing Intrinsic Amplitude: 12 mV
Lead Channel Sensing Intrinsic Amplitude: 3.5 mV
Lead Channel Setting Pacing Amplitude: 1.875
Lead Channel Setting Pacing Amplitude: 2 V
Lead Channel Setting Pacing Pulse Width: 0.4 ms
Lead Channel Setting Sensing Sensitivity: 4 mV
Pulse Gen Model: 2240
Pulse Gen Serial Number: 7826037

## 2023-03-15 NOTE — Progress Notes (Signed)
Remote pacemaker transmission.   

## 2023-04-13 ENCOUNTER — Other Ambulatory Visit (HOSPITAL_COMMUNITY): Payer: Self-pay | Admitting: Hematology

## 2023-04-13 DIAGNOSIS — Z9889 Other specified postprocedural states: Secondary | ICD-10-CM

## 2023-04-25 ENCOUNTER — Ambulatory Visit (HOSPITAL_COMMUNITY): Payer: Medicare HMO

## 2023-04-25 ENCOUNTER — Encounter (HOSPITAL_COMMUNITY): Payer: Medicare HMO

## 2023-05-04 ENCOUNTER — Inpatient Hospital Stay: Payer: Medicare HMO | Attending: Hematology

## 2023-05-04 DIAGNOSIS — D0511 Intraductal carcinoma in situ of right breast: Secondary | ICD-10-CM | POA: Insufficient documentation

## 2023-05-04 DIAGNOSIS — D649 Anemia, unspecified: Secondary | ICD-10-CM | POA: Diagnosis not present

## 2023-05-04 DIAGNOSIS — R197 Diarrhea, unspecified: Secondary | ICD-10-CM | POA: Diagnosis not present

## 2023-05-04 DIAGNOSIS — Z7901 Long term (current) use of anticoagulants: Secondary | ICD-10-CM | POA: Diagnosis not present

## 2023-05-04 DIAGNOSIS — Z79811 Long term (current) use of aromatase inhibitors: Secondary | ICD-10-CM | POA: Insufficient documentation

## 2023-05-04 DIAGNOSIS — D0581 Other specified type of carcinoma in situ of right breast: Secondary | ICD-10-CM

## 2023-05-04 DIAGNOSIS — R0602 Shortness of breath: Secondary | ICD-10-CM | POA: Insufficient documentation

## 2023-05-04 DIAGNOSIS — R5383 Other fatigue: Secondary | ICD-10-CM | POA: Insufficient documentation

## 2023-05-04 DIAGNOSIS — Z170 Estrogen receptor positive status [ER+]: Secondary | ICD-10-CM | POA: Diagnosis not present

## 2023-05-04 DIAGNOSIS — Z79899 Other long term (current) drug therapy: Secondary | ICD-10-CM | POA: Diagnosis not present

## 2023-05-04 LAB — CBC WITH DIFFERENTIAL/PLATELET
Abs Immature Granulocytes: 0.03 10*3/uL (ref 0.00–0.07)
Basophils Absolute: 0 10*3/uL (ref 0.0–0.1)
Basophils Relative: 0 %
Eosinophils Absolute: 0.6 10*3/uL — ABNORMAL HIGH (ref 0.0–0.5)
Eosinophils Relative: 8 %
HCT: 36.3 % (ref 36.0–46.0)
Hemoglobin: 11.7 g/dL — ABNORMAL LOW (ref 12.0–15.0)
Immature Granulocytes: 0 %
Lymphocytes Relative: 30 %
Lymphs Abs: 2.2 10*3/uL (ref 0.7–4.0)
MCH: 30 pg (ref 26.0–34.0)
MCHC: 32.2 g/dL (ref 30.0–36.0)
MCV: 93.1 fL (ref 80.0–100.0)
Monocytes Absolute: 0.7 10*3/uL (ref 0.1–1.0)
Monocytes Relative: 9 %
Neutro Abs: 3.7 10*3/uL (ref 1.7–7.7)
Neutrophils Relative %: 53 %
Platelets: 235 10*3/uL (ref 150–400)
RBC: 3.9 MIL/uL (ref 3.87–5.11)
RDW: 13.8 % (ref 11.5–15.5)
WBC: 7.2 10*3/uL (ref 4.0–10.5)
nRBC: 0 % (ref 0.0–0.2)

## 2023-05-04 LAB — COMPREHENSIVE METABOLIC PANEL
ALT: 10 U/L (ref 0–44)
AST: 15 U/L (ref 15–41)
Albumin: 3 g/dL — ABNORMAL LOW (ref 3.5–5.0)
Alkaline Phosphatase: 67 U/L (ref 38–126)
Anion gap: 10 (ref 5–15)
BUN: 21 mg/dL (ref 8–23)
CO2: 23 mmol/L (ref 22–32)
Calcium: 8.8 mg/dL — ABNORMAL LOW (ref 8.9–10.3)
Chloride: 103 mmol/L (ref 98–111)
Creatinine, Ser: 0.85 mg/dL (ref 0.44–1.00)
GFR, Estimated: >60 mL/min (ref >60)
Glucose, Bld: 198 mg/dL — ABNORMAL HIGH (ref 70–99)
Potassium: 3.5 mmol/L (ref 3.5–5.1)
Sodium: 136 mmol/L (ref 135–145)
Total Bilirubin: 0.7 mg/dL (ref 0.3–1.2)
Total Protein: 7 g/dL (ref 6.5–8.1)

## 2023-05-04 LAB — VITAMIN D 25 HYDROXY (VIT D DEFICIENCY, FRACTURES): Vit D, 25-Hydroxy: 65.93 ng/mL (ref 30–100)

## 2023-05-11 ENCOUNTER — Encounter: Payer: Self-pay | Admitting: Physician Assistant

## 2023-05-11 ENCOUNTER — Inpatient Hospital Stay: Payer: Medicare HMO | Admitting: Physician Assistant

## 2023-05-11 ENCOUNTER — Inpatient Hospital Stay: Payer: Medicare HMO | Admitting: Hematology

## 2023-05-11 VITALS — BP 156/66 | HR 61 | Temp 98.9°F | Resp 18 | Wt 208.6 lb

## 2023-05-11 DIAGNOSIS — R0602 Shortness of breath: Secondary | ICD-10-CM | POA: Diagnosis not present

## 2023-05-11 DIAGNOSIS — Z7901 Long term (current) use of anticoagulants: Secondary | ICD-10-CM | POA: Diagnosis not present

## 2023-05-11 DIAGNOSIS — Z79899 Other long term (current) drug therapy: Secondary | ICD-10-CM | POA: Diagnosis not present

## 2023-05-11 DIAGNOSIS — D649 Anemia, unspecified: Secondary | ICD-10-CM | POA: Diagnosis not present

## 2023-05-11 DIAGNOSIS — D0581 Other specified type of carcinoma in situ of right breast: Secondary | ICD-10-CM

## 2023-05-11 DIAGNOSIS — Z79811 Long term (current) use of aromatase inhibitors: Secondary | ICD-10-CM | POA: Diagnosis not present

## 2023-05-11 DIAGNOSIS — D0511 Intraductal carcinoma in situ of right breast: Secondary | ICD-10-CM | POA: Diagnosis not present

## 2023-05-11 DIAGNOSIS — Z170 Estrogen receptor positive status [ER+]: Secondary | ICD-10-CM | POA: Diagnosis not present

## 2023-05-11 DIAGNOSIS — R5383 Other fatigue: Secondary | ICD-10-CM | POA: Diagnosis not present

## 2023-05-11 DIAGNOSIS — R197 Diarrhea, unspecified: Secondary | ICD-10-CM | POA: Diagnosis not present

## 2023-05-11 NOTE — Progress Notes (Signed)
[facility] 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 female seen today for follow-up of right breast DCIS.  She is accompanied by her daughter. She was last seen by Dr. Ellin Saba on 11/10/2022.   Today, Natalie Castro reports her energy and appetite are fairly stable. She does have fatigue that is ongoing but she continues to do her baseline ADLs. She denies any weight loss. She denies nausea, vomiting or abdominal pain. She does have occasional episodes of diarrhea that improves with OTC antidiarrheals. She denies easy bruising or signs of bleeding. She reports chronic shortness of breath with exertion but none at rest. She has stable hot flashes with taking anastrozole. She has no other complaints. Rest of the ROS is below.   REVIEW OF SYSTEMS:   Review of Systems  Constitutional:  Positive for fatigue. Negative for appetite change, diaphoresis and fever.  Respiratory:  Positive for shortness of breath. Negative for cough.    Cardiovascular:  Negative for chest pain and leg swelling.  Gastrointestinal:  Positive for diarrhea. Negative for abdominal pain, blood in stool, constipation, nausea, rectal pain and vomiting.  Endocrine: Positive for hot flashes.  Neurological:  Positive for headaches and numbness.  All other systems reviewed and are negative.   I have reviewed the past medical history, past surgical history, social history and family history with the patient and they are unchanged from previous note.   ALLERGIES:   is allergic to penicillins.   MEDICATIONS:  Current Outpatient Medications  Medication Sig Dispense Refill   acetaminophen (TYLENOL) 325 MG tablet Take 650 mg by mouth every 6 (six) hours as needed for moderate pain.     anastrozole (ARIMIDEX) 1 MG tablet Take 1 tablet (1 mg total) by mouth daily. 90 tablet 3   apixaban (ELIQUIS) 5 MG TABS tablet Take 1 tablet (5 mg total) by mouth 2 (two) times daily. 60 tablet 0   atorvastatin (LIPITOR) 40 MG tablet Take 1 tablet (40 mg total) by mouth daily. (Patient taking differently: Take 40 mg by mouth at bedtime.) 30 tablet 0   Cholecalciferol (VITAMIN D) 125 MCG (5000 UT) CAPS Take 5,000 Units by mouth daily. 30 capsule 11   cholecalciferol (VITAMIN D3) 25 MCG (1000 UNIT) tablet Take 4,000 Units by mouth daily.     furosemide (LASIX) 40 MG tablet Take 1 tablet (40 mg total) by mouth daily. 30 tablet 0   glucose blood test strip Use to check blood glucose fasting , before lunch and dinner and after your largest meal 120 each 0   Insulin Pen Needle (BD AUTOSHIELD  DUO) 30G X 5 MM MISC 30 gauge x 3/16" (pen needle,diabetic dual safty)     Lancets (ONETOUCH ULTRASOFT) lancets Use to check glucose 4 x daily 120 each 0   LANTUS SOLOSTAR 100 UNIT/ML Solostar Pen Inject 10 Units into the skin at bedtime. (Patient taking differently: Inject 25 Units into the skin at bedtime.) 15 mL 0   losartan (COZAAR) 100 MG tablet Take 100 mg by mouth daily.      metFORMIN (GLUCOPHAGE) 1000 MG tablet TAKE (1) TABLET BY MOUTH TWICE DAILY. 60 tablet 0   metoprolol succinate (TOPROL-XL) 50 MG 24 hr tablet Take 1 tablet (50 mg total) by mouth in the morning and at bedtime. Take with or immediately following a meal. 180 tablet 3   potassium chloride (KLOR-CON) 10 MEQ tablet Take 10 mEq by mouth daily.     vitamin B-12 (CYANOCOBALAMIN) 500 MCG tablet Take 1 tablet (500 mcg total) by mouth daily. (Patient taking differently: Take 500 mcg by mouth 2 (two) times a week.)     nitroGLYCERIN (NITROSTAT) 0.4 MG SL tablet Place 1 tablet (0.4 mg total) under the tongue every 5 (five) minutes as needed for chest pain. 25 tablet 0   No current facility-administered medications for this visit.     PHYSICAL EXAMINATION: Performance status (ECOG): 1 - Symptomatic but completely ambulatory  Vitals:   05/11/23 1359  BP: (!) 156/66  Pulse: 61  Resp: 18  Temp: 98.9 F (37.2 C)  SpO2: 91%   Wt Readings from Last 3 Encounters:  05/11/23 208 lb 9.6 oz (94.6 kg)  11/10/22 195 lb 8.8 oz (88.7 kg)  10/11/22 195 lb (88.5 kg)   Physical Exam Vitals reviewed.  Constitutional:      Appearance: Normal appearance. She is obese.     Comments: In wheelchair  Cardiovascular:     Rate and Rhythm: Normal rate and regular rhythm.     Pulses: Normal pulses.     Heart sounds: Normal heart sounds.  Pulmonary:     Effort: Pulmonary effort is normal.     Breath sounds: Normal breath sounds.  Chest:     Comments: Right lumpectomy scar without any abnormalities. No palpable masses, lumps or adenopathy.  Neurological:     General: No focal deficit present.     Mental Status: She is alert and oriented to person, place, and time.  Psychiatric:        Mood and Affect: Mood normal.        Behavior: Behavior normal.    LABORATORY DATA:  I have reviewed the data as listed    Latest Ref Rng & Units 05/04/2023    1:57 PM 11/03/2022    4:07 PM 05/25/2022    1:50 PM  CMP  Glucose  70 - 99 mg/dL 161  096  94   BUN 8 - 23 mg/dL 21  21  15    Creatinine 0.44 - 1.00 mg/dL 0.45  4.09  8.11   Sodium 135 - 145 mmol/L 136  140  137   Potassium 3.5 - 5.1 mmol/L 3.5  3.5  3.4   Chloride 98 - 111 mmol/L 103  105  107   CO2 22 - 32 mmol/L 23  26  26    Calcium 8.9 - 10.3 mg/dL 8.8  9.1  8.3   Total Protein 6.5 - 8.1 g/dL 7.0  7.3  7.0   Total Bilirubin 0.3 - 1.2 mg/dL 0.7  0.5  0.9   Alkaline Phos 38 -  126 U/L 67  69  59   AST 15 - 41 U/L 15  16  17    ALT 0 - 44 U/L 10  10  14     No results found for: "CAN153" Lab Results  Component Value Date   WBC 7.2 05/04/2023   HGB 11.7 (L) 05/04/2023   HCT 36.3 05/04/2023   MCV 93.1 05/04/2023   PLT 235 05/04/2023   NEUTROABS 3.7 05/04/2023    ASSESSMENT:  Right breast DCIS: - Mammogram (04/21/2022): Highly suspicious 2.4 cm mass in the right breast at 6:00.  0.6 cm mass and a dilated duct in the lateral areola right breast at 6:00.  No evidence of axillary adenopathy.  No evidence of left breast malignancy. - Biopsy (04/26/2022): Right breast subareolar biopsy with marked duct ectasia with focal papillary epithelial hyperplasia without cytological atypia.  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  Grade 1.  No evidence of invasion.  ER 100%, PR 100%, Ki-67 10%, HER2 2+ by IHC, negative by FISH. - Lumpectomy (06/22/2022): 3.1 x 2.2 x 1.2 cm DCIS, grade 2, intracystic papillary carcinoma with rare minute foci of microinvasion, margins negative.  ER/PR 100%.  HER2 2+ by IHC and negative by FISH.  Ki-67 10%.  pTis PNX. - Anastrozole started on 07/04/2022.  She was reluctant to consider radiation.    Social/family history: - She lives at home with her daughter.  Uses rollator for ambulation secondary to arthritis.  She is able to do all her ADLs.  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  Non-smoker. - Sister had metastatic breast  cancer in her 72s.   PLAN:  Right breast DCIS (grade 2), ER/PR positive: - Labs from 05/04/2023. WBC 7.2, mild anemia that is stable with Hgb 11.7, Plt 235K, Creatinine and LFTs normal.  --We can check iron panel at next visit.  -Patient is due for a mammogram, will schedule today.  --Physical exam findings shows no palpable masses or adenopathy ---She is tolerating anastrozole very well outside of stable hot flashes. Recommend to continue - RTC 6 months for follow-up with labs.  2.  Bone health (DEXA 05/25/2022 T score -0.7): - Vitamin D level from 05/04/2023 was normal at 65.  - Continue vitamin D 5000 units daily.  Breast Cancer therapy associated bone loss: I have recommended calcium, Vitamin D and weight bearing exercises.  Orders placed this encounter:  No orders of the defined types were placed in this encounter.   The patient has a good understanding of the overall plan. She agrees with it. She will call with any problems that may develop before the next visit here.  I have spent a total of 30 minutes minutes of face-to-face and non-face-to-face time, preparing to see the patient, performing a medically appropriate examination, counseling and educating the patient, ordering tests/procedures, documenting clinical information in the electronic health record,  and care coordination.   Georga Kaufmann PA-C Dept of Hematology and Oncology [facility]

## 2023-05-17 ENCOUNTER — Ambulatory Visit (INDEPENDENT_AMBULATORY_CARE_PROVIDER_SITE_OTHER): Payer: Medicare Other

## 2023-05-17 DIAGNOSIS — I442 Atrioventricular block, complete: Secondary | ICD-10-CM

## 2023-05-17 LAB — CUP PACEART REMOTE DEVICE CHECK
Battery Remaining Longevity: 37 mo
Battery Remaining Percentage: 33 %
Battery Voltage: 2.96 V
Brady Statistic AP VP Percent: 59 %
Brady Statistic AP VS Percent: 1 %
Brady Statistic AS VP Percent: 40 %
Brady Statistic AS VS Percent: 1 %
Brady Statistic RA Percent Paced: 59 %
Brady Statistic RV Percent Paced: 99 %
Date Time Interrogation Session: 20240522020013
Implantable Lead Connection Status: 753985
Implantable Lead Connection Status: 753985
Implantable Lead Implant Date: 20161129
Implantable Lead Implant Date: 20161129
Implantable Lead Location: 753859
Implantable Lead Location: 753860
Implantable Lead Model: 5076
Implantable Pulse Generator Implant Date: 20161129
Lead Channel Impedance Value: 390 Ohm
Lead Channel Impedance Value: 530 Ohm
Lead Channel Pacing Threshold Amplitude: 0.75 V
Lead Channel Pacing Threshold Amplitude: 0.875 V
Lead Channel Pacing Threshold Pulse Width: 0.4 ms
Lead Channel Pacing Threshold Pulse Width: 0.4 ms
Lead Channel Sensing Intrinsic Amplitude: 12 mV
Lead Channel Sensing Intrinsic Amplitude: 3.1 mV
Lead Channel Setting Pacing Amplitude: 1.875
Lead Channel Setting Pacing Amplitude: 2 V
Lead Channel Setting Pacing Pulse Width: 0.4 ms
Lead Channel Setting Sensing Sensitivity: 4 mV
Pulse Gen Model: 2240
Pulse Gen Serial Number: 7826037

## 2023-05-23 ENCOUNTER — Ambulatory Visit (HOSPITAL_COMMUNITY)
Admission: RE | Admit: 2023-05-23 | Discharge: 2023-05-23 | Disposition: A | Discharge status: Home / Self Care | Payer: Medicare HMO | Source: Ambulatory Visit | Attending: Hematology | Admitting: Hematology

## 2023-05-23 DIAGNOSIS — Z9889 Other specified postprocedural states: Secondary | ICD-10-CM

## 2023-05-23 DIAGNOSIS — D0581 Other specified type of carcinoma in situ of right breast: Secondary | ICD-10-CM | POA: Insufficient documentation

## 2023-05-23 DIAGNOSIS — Z853 Personal history of malignant neoplasm of breast: Secondary | ICD-10-CM | POA: Diagnosis not present

## 2023-05-23 DIAGNOSIS — N6489 Other specified disorders of breast: Secondary | ICD-10-CM | POA: Diagnosis not present

## 2023-05-23 DIAGNOSIS — R92333 Mammographic heterogeneous density, bilateral breasts: Secondary | ICD-10-CM | POA: Diagnosis not present

## 2023-06-07 NOTE — Progress Notes (Signed)
Remote pacemaker transmission.   

## 2023-06-22 ENCOUNTER — Other Ambulatory Visit: Payer: Self-pay | Admitting: Adult Health

## 2023-06-27 ENCOUNTER — Other Ambulatory Visit: Payer: Self-pay | Admitting: Hematology

## 2023-06-27 DIAGNOSIS — D0581 Other specified type of carcinoma in situ of right breast: Secondary | ICD-10-CM

## 2023-06-28 ENCOUNTER — Other Ambulatory Visit: Payer: Self-pay | Admitting: *Deleted

## 2023-06-28 NOTE — Telephone Encounter (Signed)
Anastrozole refill approved.  Patient is tolerating and is to continue therapy.  

## 2023-07-20 DIAGNOSIS — Z794 Long term (current) use of insulin: Secondary | ICD-10-CM | POA: Diagnosis not present

## 2023-07-20 DIAGNOSIS — E119 Type 2 diabetes mellitus without complications: Secondary | ICD-10-CM | POA: Diagnosis not present

## 2023-07-26 DIAGNOSIS — R0601 Orthopnea: Secondary | ICD-10-CM | POA: Diagnosis not present

## 2023-07-26 DIAGNOSIS — I110 Hypertensive heart disease with heart failure: Secondary | ICD-10-CM | POA: Diagnosis not present

## 2023-07-26 DIAGNOSIS — E785 Hyperlipidemia, unspecified: Secondary | ICD-10-CM | POA: Diagnosis not present

## 2023-07-26 DIAGNOSIS — Z86711 Personal history of pulmonary embolism: Secondary | ICD-10-CM | POA: Diagnosis not present

## 2023-07-26 DIAGNOSIS — Z0001 Encounter for general adult medical examination with abnormal findings: Secondary | ICD-10-CM | POA: Diagnosis not present

## 2023-07-26 DIAGNOSIS — I10 Essential (primary) hypertension: Secondary | ICD-10-CM | POA: Diagnosis not present

## 2023-07-26 DIAGNOSIS — I5030 Unspecified diastolic (congestive) heart failure: Secondary | ICD-10-CM | POA: Diagnosis not present

## 2023-07-26 DIAGNOSIS — Z950 Presence of cardiac pacemaker: Secondary | ICD-10-CM | POA: Diagnosis not present

## 2023-07-26 DIAGNOSIS — E119 Type 2 diabetes mellitus without complications: Secondary | ICD-10-CM | POA: Diagnosis not present

## 2023-08-15 ENCOUNTER — Ambulatory Visit (INDEPENDENT_AMBULATORY_CARE_PROVIDER_SITE_OTHER): Payer: Medicare HMO | Admitting: Podiatry

## 2023-08-15 ENCOUNTER — Encounter: Payer: Self-pay | Admitting: Podiatry

## 2023-08-15 DIAGNOSIS — M79675 Pain in left toe(s): Secondary | ICD-10-CM

## 2023-08-15 DIAGNOSIS — M79674 Pain in right toe(s): Secondary | ICD-10-CM

## 2023-08-15 DIAGNOSIS — B351 Tinea unguium: Secondary | ICD-10-CM | POA: Diagnosis not present

## 2023-08-15 DIAGNOSIS — E119 Type 2 diabetes mellitus without complications: Secondary | ICD-10-CM | POA: Diagnosis not present

## 2023-08-15 NOTE — Progress Notes (Signed)
This patient returns to my office for at risk foot care.  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 This patient is unable to cut nails himself since the patient cannot reach his nails.These nails are painful walking and wearing shoes.  This patient presents for at risk foot care today.  General Appearance  Alert, conversant and in no acute stress.  Vascular  Dorsalis pedis and posterior tibial  pulses are  weakly palpable  bilaterally.  Capillary return is within normal limits  bilaterally. Temperature is within normal limits  bilaterally.  Neurologic  Senn-Weinstein monofilament wire test within normal limits  bilaterally. Muscle power within normal limits bilaterally.  Nails Thick disfigured discolored nails with subungual debris  from hallux to fifth toes bilaterally. No evidence of bacterial infection or drainage bilaterally.  Orthopedic  No limitations of motion  feet .  No crepitus or effusions noted.  No bony pathology or digital deformities noted.  Skin  normotropic skin with no porokeratosis noted bilaterally.  No signs of infections or ulcers noted.     Onychomycosis  Pain in right toes  Pain in left toes  Consent was obtained for treatment procedures.   Mechanical debridement of nails 1-5  bilaterally performed with a nail nipper.  Filed with dremel without incident.    Return office visit    4 months                  Told patient to return for periodic foot care and evaluation due to potential at risk complications.   Helane Gunther DPM

## 2023-08-16 ENCOUNTER — Ambulatory Visit (INDEPENDENT_AMBULATORY_CARE_PROVIDER_SITE_OTHER): Payer: Medicare HMO

## 2023-08-16 DIAGNOSIS — I442 Atrioventricular block, complete: Secondary | ICD-10-CM | POA: Diagnosis not present

## 2023-08-16 LAB — CUP PACEART REMOTE DEVICE CHECK
Battery Remaining Longevity: 34 mo
Battery Remaining Percentage: 30 %
Battery Voltage: 2.95 V
Brady Statistic AP VP Percent: 56 %
Brady Statistic AP VS Percent: 1 %
Brady Statistic AS VP Percent: 44 %
Brady Statistic AS VS Percent: 1 %
Brady Statistic RA Percent Paced: 56 %
Brady Statistic RV Percent Paced: 99 %
Date Time Interrogation Session: 20240821020015
Implantable Lead Connection Status: 753985
Implantable Lead Connection Status: 753985
Implantable Lead Implant Date: 20161129
Implantable Lead Implant Date: 20161129
Implantable Lead Location: 753859
Implantable Lead Location: 753860
Implantable Lead Model: 5076
Implantable Pulse Generator Implant Date: 20161129
Lead Channel Impedance Value: 360 Ohm
Lead Channel Impedance Value: 440 Ohm
Lead Channel Pacing Threshold Amplitude: 0.75 V
Lead Channel Pacing Threshold Amplitude: 0.75 V
Lead Channel Pacing Threshold Pulse Width: 0.4 ms
Lead Channel Pacing Threshold Pulse Width: 0.4 ms
Lead Channel Sensing Intrinsic Amplitude: 12 mV
Lead Channel Sensing Intrinsic Amplitude: 3.1 mV
Lead Channel Setting Pacing Amplitude: 1.75 V
Lead Channel Setting Pacing Amplitude: 2 V
Lead Channel Setting Pacing Pulse Width: 0.4 ms
Lead Channel Setting Sensing Sensitivity: 4 mV
Pulse Gen Model: 2240
Pulse Gen Serial Number: 7826037

## 2023-08-29 NOTE — Progress Notes (Signed)
Remote pacemaker transmission.   

## 2023-10-03 ENCOUNTER — Other Ambulatory Visit (HOSPITAL_COMMUNITY): Payer: Self-pay | Admitting: Family Medicine

## 2023-10-03 ENCOUNTER — Ambulatory Visit (HOSPITAL_COMMUNITY)
Admission: RE | Admit: 2023-10-03 | Discharge: 2023-10-03 | Disposition: A | Discharge status: Home / Self Care | Payer: Medicare HMO | Source: Ambulatory Visit | Attending: Family Medicine | Admitting: Family Medicine

## 2023-10-03 DIAGNOSIS — R197 Diarrhea, unspecified: Secondary | ICD-10-CM | POA: Diagnosis not present

## 2023-10-03 DIAGNOSIS — R0601 Orthopnea: Secondary | ICD-10-CM | POA: Diagnosis not present

## 2023-10-03 DIAGNOSIS — R202 Paresthesia of skin: Secondary | ICD-10-CM | POA: Diagnosis not present

## 2023-10-03 DIAGNOSIS — I700 Atherosclerosis of aorta: Secondary | ICD-10-CM | POA: Diagnosis not present

## 2023-10-03 DIAGNOSIS — R29898 Other symptoms and signs involving the musculoskeletal system: Secondary | ICD-10-CM | POA: Diagnosis not present

## 2023-10-10 ENCOUNTER — Ambulatory Visit: Payer: Medicare HMO | Admitting: Internal Medicine

## 2023-10-18 ENCOUNTER — Ambulatory Visit: Payer: Medicare HMO | Attending: Internal Medicine | Admitting: Internal Medicine

## 2023-10-18 ENCOUNTER — Encounter: Payer: Self-pay | Admitting: Internal Medicine

## 2023-10-18 VITALS — BP 156/76 | HR 68 | Ht 65.0 in | Wt 212.8 lb

## 2023-10-18 DIAGNOSIS — I442 Atrioventricular block, complete: Secondary | ICD-10-CM | POA: Diagnosis not present

## 2023-10-18 MED ORDER — FUROSEMIDE 40 MG PO TABS: ORAL_TABLET | ORAL | 3 refills | Status: DC

## 2023-10-18 NOTE — Patient Instructions (Signed)
Medication Instructions:  Your physician has recommended you make the following change in your medication:  Lasix :Take 1 tablet by mouth Monday, Wednesday, Friday. Take 2 tablets all other days.   Lab Work: None ordered.  If you have labs (blood work) drawn today and your tests are completely normal, you will receive your results only by: MyChart Message (if you have MyChart) OR A paper copy in the mail If you have any lab test that is abnormal or we need to change your treatment, we will call you to review the results.  Testing/Procedures: None ordered.  Follow-Up: At [facility], you and your health needs are our priority.  As part of our continuing mission to provide you with exceptional heart care, we have created designated Provider Care Teams.  These Care Teams include your primary Cardiologist (physician) and Advanced Practice Providers (APPs -  Physician Assistants and Nurse Practitioners) who all work together to provide you with the care you need, when you need it.  We recommend signing up for the patient portal called "MyChart".  Sign up information is provided on this After Visit Summary.  MyChart is used to connect with patients for Virtual Visits (Telemedicine).  Patients are able to view lab/test results, encounter notes, upcoming appointments, etc.  Non-urgent messages can be sent to your provider as well.   To learn more about what you can do with MyChart, go to [email or web address].    Your next appointment:   1 year in [place]  The format for your next appointment:   In Person  Provider:   Lewayne Bunting, MD{or one of the following Advanced Practice Providers on your designated Care Team:   Francis Dowse, [place] Casimiro Needle "Mardelle Matte" [place], [place] Earnest Rosier, NP  Important Information About Sugar

## 2023-10-18 NOTE — Progress Notes (Signed)
HPI Mrs. Riskin presents today for ongoing evaluation and management of her PPM. She is a pleasant 82 yo woman with a h/o HTN and DM who developed syncope and sob and was found to have CHB, and underwent PPM insertion.  She was hospitalized remotely with pulmonary emboli. No chest pain. No bleeding. She admits to dietary indiscretion. She has occaisional palpitations.  She does c/o sob. Allergies  Allergen Reactions   Penicillins Hives, Itching and Other (See Comments)    Has tolerated ceftriaxone 7/22 Has patient had a PCN reaction causing immediate rash, facial/tongue/throat swelling, SOB or lightheadedness with hypotension: No Has patient had a PCN reaction causing severe rash involving mucus membranes or skin necrosis: Yes Has patient had a PCN reaction that required hospitalization No Has patient had a PCN reaction occurring within the last 10 years: No If all of the above answers are "NO", then may proceed with Cephalosporin use.      Current Outpatient Medications  Medication Sig Dispense Refill   acetaminophen (TYLENOL) 325 MG tablet Take 650 mg by mouth every 6 (six) hours as needed for moderate pain.     anastrozole (ARIMIDEX) 1 MG tablet TAKE 1 TABLET(1 MG) BY MOUTH DAILY 90 tablet 3   apixaban (ELIQUIS) 5 MG TABS tablet Take 1 tablet (5 mg total) by mouth 2 (two) times daily. 60 tablet 0   atorvastatin (LIPITOR) 40 MG tablet Take 1 tablet (40 mg total) by mouth daily. (Patient taking differently: Take 40 mg by mouth at bedtime.) 30 tablet 0   Cholecalciferol (VITAMIN D) 125 MCG (5000 UT) CAPS Take 5,000 Units by mouth daily. 30 capsule 11   cholecalciferol (VITAMIN D3) 25 MCG (1000 UNIT) tablet Take 4,000 Units by mouth daily.     glucose blood test strip Use to check blood glucose fasting , before lunch and dinner and after your largest meal 120 each 0   Insulin Pen Needle (BD AUTOSHIELD DUO) 30G X 5 MM MISC 30 gauge x 3/16" (pen needle,diabetic dual safty)     Lancets  (ONETOUCH ULTRASOFT) lancets Use to check glucose 4 x daily 120 each 0   LANTUS SOLOSTAR 100 UNIT/ML Solostar Pen Inject 10 Units into the skin at bedtime. (Patient taking differently: Inject 25 Units into the skin at bedtime.) 15 mL 0   losartan (COZAAR) 100 MG tablet Take 100 mg by mouth daily.     metFORMIN (GLUCOPHAGE) 1000 MG tablet TAKE (1) TABLET BY MOUTH TWICE DAILY. 60 tablet 0   metoprolol succinate (TOPROL-XL) 50 MG 24 hr tablet Take 1 tablet (50 mg total) by mouth in the morning and at bedtime. Take with or immediately following a meal. 180 tablet 3   potassium chloride (KLOR-CON) 10 MEQ tablet Take 10 mEq by mouth daily.     vitamin B-12 (CYANOCOBALAMIN) 500 MCG tablet Take 1 tablet (500 mcg total) by mouth daily. (Patient taking differently: Take 500 mcg by mouth 2 (two) times a week.)     furosemide (LASIX) 40 MG tablet Take 1 tablet (40 mg) daily Monday, Wednesday, Friday. Take 2 tablets (80 mg total) all other days. 140 tablet 3   nitroGLYCERIN (NITROSTAT) 0.4 MG SL tablet Place 1 tablet (0.4 mg total) under the tongue every 5 (five) minutes as needed for chest pain. 25 tablet 0   No current facility-administered medications for this visit.     Past Medical History:  Diagnosis Date   Acid reflux    Arthritis  Colon adenomas    AGE 62   Complete heart block (HCC)    STJ PPM Dr. Johney Frame 11/24/15   Dyspnea    Essential hypertension    History of pulmonary embolism 2017   unprovoked, long term anticoag with apixaban    Hyperlipidemia    Myoview 03/2021    Myoview 4/22: EF 50, no infarct or ischemia; low risk   Presence of permanent cardiac pacemaker    Type 2 diabetes mellitus (HCC)     ROS:   All systems reviewed and negative except as noted in the HPI.   Past Surgical History:  Procedure Laterality Date   CHOLECYSTECTOMY N/A 03/16/2022   Procedure: LAPAROSCOPIC CHOLECYSTECTOMY;  Surgeon: Lucretia Roers, MD;  Location: [facility];  Service: General;  Laterality:  N/A;   COLONOSCOPY     3 SIMPLE ADENOMAS, AGE 62   COLONOSCOPY N/A 05/24/2018   Procedure: COLONOSCOPY;  Surgeon: West Bali, MD;  Location: [facility];  Service: Endoscopy;  Laterality: N/A;  1:00pm   ENDOSCOPIC RETROGRADE CHOLANGIOPANCREATOGRAPHY (ERCP) WITH PROPOFOL N/A 03/15/2022   Procedure: ENDOSCOPIC RETROGRADE CHOLANGIOPANCREATOGRAPHY (ERCP) WITH PROPOFOL;  Surgeon: Malissa Hippo, MD;  Location: [facility];  Service: Endoscopy;  Laterality: N/A;  with sphincterotomy and stone retrieval   EP IMPLANTABLE DEVICE N/A 11/24/2015   Procedure: Pacemaker Implant;  Surgeon: Hillis Range, MD;  Location: [facility]AS[facility];  Service: Cardiovascular;  Laterality: N/A;   IR EXCHANGE BILIARY DRAIN  07/27/2021   IR EXCHANGE BILIARY DRAIN  08/23/2021   IR EXCHANGE BILIARY DRAIN  02/25/2022   IR PERC CHOLECYSTOSTOMY  07/08/2021   IR PERC CHOLECYSTOSTOMY  11/04/2021   IR RADIOLOGIST EVAL & MGMT  09/22/2021   JOINT REPLACEMENT     knees bilat.   LEFT HEART CATH AND CORONARY ANGIOGRAPHY N/A 07/02/2021   Procedure: LEFT HEART CATH AND CORONARY ANGIOGRAPHY;  Surgeon: Corky Crafts, MD;  Location: [facility] INVASIVE CV LAB;  Service: Cardiovascular;  Laterality: N/A;   POLYPECTOMY  05/24/2018   Procedure: POLYPECTOMY;  Surgeon: West Bali, MD;  [facility]: [facility];  Service: Endoscopy;;  ascending and hepatic flexure, transverse   REMOVAL OF STONES N/A 03/15/2022   Procedure: STONE AND SLUDGE EXTRACTION;  Surgeon: Malissa Hippo, MD;  Location: [facility];  Service: Endoscopy;  Laterality: N/A;   TEE WITHOUT CARDIOVERSION N/A 08/27/2021   Procedure: TRANSESOPHAGEAL ECHOCARDIOGRAM (TEE);  Surgeon: Chrystie Nose, MD;  Location: [facility] ENDOSCOPY;  Service: Cardiovascular;  Laterality: N/A;   TUBAL LIGATION       Family History  Problem Relation Age of Onset   Heart failure Mother    Hypertension Mother    Heart failure Sister    Hypertension Father    Cancer Sister    Dementia Sister    Colon cancer  Neg Hx    Colon polyps Neg Hx      Social History   Socioeconomic History   Marital status: Widowed    Spouse name: Not on file   Number of children: Not on file   Years of education: Not on file   Highest education level: Not on file  Occupational History   Occupation: retired  Tobacco Use   Smoking status: Never   Smokeless tobacco: Never  Vaping Use   Vaping status: Never Used  Substance and Sexual Activity   Alcohol use: No    Alcohol/week: 0.0 standard drinks of alcohol   Drug use: No   Sexual activity: Yes  Birth control/protection: Post-menopausal  Other Topics Concern   Not on file  Social History Narrative   MARRIED FOR 56 YRS. 5 KIDS: #4 PRESENT TODAY(AGE 21)   Social Determinants of Health   Financial Resource Strain: Not on file  Food Insecurity: Not on file  Transportation Needs: Not on file  Physical Activity: Not on file  Stress: Not on file  Social Connections: Not on file  Intimate Partner Violence: Not on file     BP (!) 156/76   Pulse 68   Ht 5\' 5"  (1.651 m)   Wt 212 lb 12.8 oz (96.5 kg)   SpO2 96%   BMI 35.41 kg/m   Physical Exam:  Well appearing NAD HEENT: Unremarkable Neck:  No JVD, no thyromegally Lymphatics:  No adenopathy Back:  No CVA tenderness Lungs:  Clear with scattered rales HEART:  Regular rate rhythm, no murmurs, no rubs, no clicks Abd:  soft, positive bowel sounds, no organomegally, no rebound, no guarding Ext:  2 plus pulses, no edema, no cyanosis, no clubbing Skin:  No rashes no nodules Neuro:  CN II through XII intact, motor grossly intact  EKG - nsr with ventricular pacing  DEVICE  Normal device function.  See PaceArt for details.   Assess/Plan: Sob - I have asked her to increase her lasix to 40 mg m/w/f and 80 mg the other days. Heart block - she is asymptomatic s/p PPM insertion. Obesity - she is encouraged to lose weight.  Sharlot Gowda Kaylin Marcon,MD

## 2023-10-26 LAB — CUP PACEART INCLINIC DEVICE CHECK
Battery Remaining Longevity: 32 mo
Battery Voltage: 2.95 V
Brady Statistic RA Percent Paced: 58 %
Brady Statistic RV Percent Paced: 99.71 %
Date Time Interrogation Session: 20241023132404
Implantable Lead Connection Status: 753985
Implantable Lead Connection Status: 753985
Implantable Lead Implant Date: 20161129
Implantable Lead Implant Date: 20161129
Implantable Lead Location: 753859
Implantable Lead Location: 753860
Implantable Lead Model: 5076
Implantable Pulse Generator Implant Date: 20161129
Lead Channel Impedance Value: 362.5 Ohm
Lead Channel Impedance Value: 487.5 Ohm
Lead Channel Pacing Threshold Amplitude: 0.75 V
Lead Channel Pacing Threshold Amplitude: 0.75 V
Lead Channel Pacing Threshold Amplitude: 0.75 V
Lead Channel Pacing Threshold Amplitude: 0.75 V
Lead Channel Pacing Threshold Pulse Width: 0.4 ms
Lead Channel Pacing Threshold Pulse Width: 0.4 ms
Lead Channel Pacing Threshold Pulse Width: 0.4 ms
Lead Channel Pacing Threshold Pulse Width: 0.4 ms
Lead Channel Sensing Intrinsic Amplitude: 10 mV
Lead Channel Sensing Intrinsic Amplitude: 3.6 mV
Lead Channel Setting Pacing Amplitude: 1.75 V
Lead Channel Setting Pacing Amplitude: 2 V
Lead Channel Setting Pacing Pulse Width: 0.4 ms
Lead Channel Setting Sensing Sensitivity: 4 mV
Pulse Gen Model: 2240
Pulse Gen Serial Number: 7826037

## 2023-11-01 ENCOUNTER — Inpatient Hospital Stay: Payer: Medicare HMO | Attending: Hematology

## 2023-11-01 DIAGNOSIS — C50911 Malignant neoplasm of unspecified site of right female breast: Secondary | ICD-10-CM | POA: Insufficient documentation

## 2023-11-01 DIAGNOSIS — E538 Deficiency of other specified B group vitamins: Secondary | ICD-10-CM | POA: Insufficient documentation

## 2023-11-01 DIAGNOSIS — Z170 Estrogen receptor positive status [ER+]: Secondary | ICD-10-CM | POA: Diagnosis not present

## 2023-11-01 DIAGNOSIS — Z79899 Other long term (current) drug therapy: Secondary | ICD-10-CM | POA: Insufficient documentation

## 2023-11-01 DIAGNOSIS — D0581 Other specified type of carcinoma in situ of right breast: Secondary | ICD-10-CM

## 2023-11-01 DIAGNOSIS — Z79811 Long term (current) use of aromatase inhibitors: Secondary | ICD-10-CM | POA: Diagnosis not present

## 2023-11-01 DIAGNOSIS — D649 Anemia, unspecified: Secondary | ICD-10-CM | POA: Diagnosis not present

## 2023-11-01 LAB — COMPREHENSIVE METABOLIC PANEL
ALT: 10 U/L (ref 0–44)
AST: 13 U/L — ABNORMAL LOW (ref 15–41)
Albumin: 3.1 g/dL — ABNORMAL LOW (ref 3.5–5.0)
Alkaline Phosphatase: 73 U/L (ref 38–126)
Anion gap: 10 (ref 5–15)
BUN: 21 mg/dL (ref 8–23)
CO2: 26 mmol/L (ref 22–32)
Calcium: 8.8 mg/dL — ABNORMAL LOW (ref 8.9–10.3)
Chloride: 103 mmol/L (ref 98–111)
Creatinine, Ser: 1.02 mg/dL — ABNORMAL HIGH (ref 0.44–1.00)
GFR, Estimated: 55 mL/min — ABNORMAL LOW (ref >60)
Glucose, Bld: 232 mg/dL — ABNORMAL HIGH (ref 70–99)
Potassium: 3.7 mmol/L (ref 3.5–5.1)
Sodium: 139 mmol/L (ref 135–145)
Total Bilirubin: 0.5 mg/dL (ref <1.2)
Total Protein: 7.2 g/dL (ref 6.5–8.1)

## 2023-11-01 LAB — CBC WITH DIFFERENTIAL/PLATELET
Abs Immature Granulocytes: 0.02 10*3/uL (ref 0.00–0.07)
Basophils Absolute: 0 10*3/uL (ref 0.0–0.1)
Basophils Relative: 0 %
Eosinophils Absolute: 0.1 10*3/uL (ref 0.0–0.5)
Eosinophils Relative: 2 %
HCT: 38.3 % (ref 36.0–46.0)
Hemoglobin: 11.9 g/dL — ABNORMAL LOW (ref 12.0–15.0)
Immature Granulocytes: 0 %
Lymphocytes Relative: 39 %
Lymphs Abs: 2.5 10*3/uL (ref 0.7–4.0)
MCH: 28.9 pg (ref 26.0–34.0)
MCHC: 31.1 g/dL (ref 30.0–36.0)
MCV: 93 fL (ref 80.0–100.0)
Monocytes Absolute: 0.6 10*3/uL (ref 0.1–1.0)
Monocytes Relative: 9 %
Neutro Abs: 3.2 10*3/uL (ref 1.7–7.7)
Neutrophils Relative %: 50 %
Platelets: 218 10*3/uL (ref 150–400)
RBC: 4.12 MIL/uL (ref 3.87–5.11)
RDW: 14.6 % (ref 11.5–15.5)
WBC: 6.4 10*3/uL (ref 4.0–10.5)
nRBC: 0 % (ref 0.0–0.2)

## 2023-11-01 LAB — IRON AND TIBC
Iron: 76 ug/dL (ref 28–170)
Saturation Ratios: 30 % (ref 10.4–31.8)
TIBC: 254 ug/dL (ref 250–450)
UIBC: 178 ug/dL

## 2023-11-01 LAB — VITAMIN D 25 HYDROXY (VIT D DEFICIENCY, FRACTURES): Vit D, 25-Hydroxy: 49.95 ng/mL (ref 30–100)

## 2023-11-01 LAB — FOLATE: Folate: 8.7 ng/mL (ref >5.9)

## 2023-11-01 LAB — FERRITIN: Ferritin: 37 ng/mL (ref 11–307)

## 2023-11-01 LAB — VITAMIN B12: Vitamin B-12: 61 pg/mL — ABNORMAL LOW (ref 180–914)

## 2023-11-07 NOTE — Progress Notes (Unsigned)
[facility] [address], [place] [address]   CLINIC:  Medical Oncology/Hematology  PCP:  Gwyndolyn Kaufman, FNP 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, a 82 y.o. female, returns for routine follow-up of her right breast DCIS. Donecia was last seen on 05/11/2023 by Georga Kaufmann PA-C.   At today's visit, she  reports feeling ***.  She denies any recent hospitalizations, surgeries, or changes in her  baseline health status.  She denies any symptoms of recurrence such as new lumps, bone pain, chest pain, dyspnea, or abdominal pain.  *** *** She has no new headaches, seizures, or focal neurologic deficits. *** No B symptoms such as fever, chills, night sweats, unintentional weight loss.  She continues to take *** ARIMIDEX:  Hot flashes, mood swings, weight gain, alopecia, and musculoskeletal pain  *** Bleeding *** Fatigue *** Pica, LH, syncope *** B12 or iron?  She reports ***% energy and ***%  appetite.  She is maintaining stable weight at this time.   ASSESSMENT & PLAN:  1.  Right breast DCIS: - Mammogram (04/21/2022): Highly suspicious 2.4 cm mass in the right breast at 6:00.  0.6 cm mass and a dilated duct in the lateral areola right breast at 6:00.  No evidence of axillary adenopathy.  No evidence of left breast malignancy. - Biopsy (04/26/2022): Right breast subareolar biopsy with marked duct ectasia with focal papillary epithelial hyperplasia without cytological atypia.  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  Grade 1.  No evidence of invasion.  ER 100%, PR 100%, Ki-67 10%, HER2 2+ by IHC, negative by FISH. - Lumpectomy (06/22/2022): 3.1 x 2.2 x 1.2 cm DCIS, grade 2, intracystic papillary carcinoma with rare minute foci of microinvasion, margins negative.  ER/PR 100%.  HER2 2+ by IHC and negative by FISH.  Ki-67 10%.  pTis PNX. - Anastrozole started on 07/04/2022.  Goal of treatment is 5 years. - She was reluctant to consider radiation. - Most recent mammogram (05/23/2023): BI-RADS Category 3, probably benign findings including new lumpectomy changes in right breast.  Asymmetry in upper central to outer right breast felt to be related to postsurgical change.  No obvious mammographic evidence of malignancy.  Repeat mammogram recommended for 6 months.  *** - Physical exam *** - Tolerating anastrozole well outside of mild hot flashes *** - No "red flag" symptoms per history/ROS today *** - Most recent labs (11/01/2023): Normal LFTs. - PLAN: Diagnostic mammogram due around 11/23/2023 (57-month follow-up for BI-RADS 3 mammogram  in May 2024) - Continue anastrozole daily. - RTC in 6 months with repeat labs and office visit ***  2.  Bone health (DEXA 05/25/2022 T score -0.7): - Vitamin D level from 11/01/2023 was normal at 49.95.  Calcium 8.8 (corrected calcium normal). - PLAN: Continue vitamin D 5000 units daily. - Repeat  DEXA scan due around 05/25/2024 ***  3.  Mild normocytic anemia secondary to vitamin B12 and iron deficiencies - Labs from 11/01/2023 show Hgb 11.9/MCV 93.0.  Ferritin 37, iron saturation 30%.  Vitamin B12 significantly low at 61.  Normal folate.  Creatinine 1.02/GFR 55 - *** Bleeding - *** Symptoms - PLAN: Recommend vitamin B12 injections weekly x 4 followed by monthly.  *** First dose to be given today.  *** - Recommend starting ferrous sulfate 325 mg daily. - Repeat labs with phone visit in 3 months ***  4.  Social/family history: - She lives at home with her daughter.  Uses rollator for ambulation secondary to arthritis.  She is able to do all her ADLs.  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  Non-smoker. - Sister had metastatic breast cancer in her 74s.  PLAN SUMMARY: >> Diagnostic mammogram due around 11/23/2023 (74-month follow-up for abnormal mammogram from May 2024) >> Weekly vitamin B12 injections x 4, followed by monthly B12 injections >> Labs in 3 months = CBC/D, B12, MMA, ferritin, iron/TIBC >> PHONE *** visit in 3 months (1 week after labs)    REVIEW OF SYSTEMS: ***  Review of Systems - Oncology  PHYSICAL EXAM:   Performance status (ECOG): {CHL ONC NW:[id number]} *** There were no vitals filed for this visit. Wt Readings from Last 3 Encounters:  10/18/23 212 lb 12.8 oz (96.5 kg)  05/11/23 208 lb 9.6 oz (94.6 kg)  11/10/22 195 lb 8.8 oz (88.7 kg)   Physical Exam   PAST MEDICAL/SURGICAL HISTORY:  Past Medical History:  Diagnosis Date   Acid reflux    Arthritis    Colon adenomas    AGE 33   Complete heart block (HCC)    STJ PPM Dr. Johney Frame 11/24/15   Dyspnea    Essential hypertension    History of pulmonary embolism 2017   unprovoked, long term anticoag with apixaban    Hyperlipidemia    Myoview 03/2021    Myoview 4/22: EF 50, no infarct or ischemia; low risk   Presence of permanent cardiac pacemaker    Type 2  diabetes mellitus (HCC)    Past Surgical History:  Procedure Laterality Date   CHOLECYSTECTOMY N/A 03/16/2022   Procedure: LAPAROSCOPIC CHOLECYSTECTOMY;  Surgeon: Lucretia Roers, MD;  Location: [facility];  Service: General;  Laterality: N/A;   COLONOSCOPY     3 SIMPLE ADENOMAS, AGE 33   COLONOSCOPY N/A 05/24/2018   Procedure: COLONOSCOPY;  Surgeon: West Bali, MD;  Location: [facility];  Service: Endoscopy;  Laterality: N/A;  1:00pm   ENDOSCOPIC RETROGRADE CHOLANGIOPANCREATOGRAPHY (ERCP) WITH PROPOFOL N/A 03/15/2022   Procedure: ENDOSCOPIC RETROGRADE CHOLANGIOPANCREATOGRAPHY (ERCP) WITH PROPOFOL;  Surgeon: Malissa Hippo, MD;  Location: [facility];  Service: Endoscopy;  Laterality: N/A;  with sphincterotomy and stone retrieval   EP IMPLANTABLE DEVICE N/A 11/24/2015   Procedure: Pacemaker Implant;  Surgeon: Hillis Range, MD;  Location: [facility]VAS[facility] CV LAB;  Service: Cardiovascular;  Laterality: N/A;   IR EXCHANGE BILIARY DRAIN  07/27/2021   IR EXCHANGE BILIARY DRAIN  08/23/2021   IR EXCHANGE BILIARY DRAIN  02/25/2022   IR PERC CHOLECYSTOSTOMY  07/08/2021   IR PERC CHOLECYSTOSTOMY  11/04/2021   IR RADIOLOGIST EVAL & MGMT  09/22/2021   JOINT REPLACEMENT     knees bilat.   LEFT HEART CATH AND CORONARY ANGIOGRAPHY N/A 07/02/2021   Procedure: LEFT HEART CATH AND CORONARY ANGIOGRAPHY;  Surgeon: Corky Crafts, MD;  Location: [facility] INVASIVE CV LAB;  Service: Cardiovascular;  Laterality: N/A;   POLYPECTOMY  05/24/2018   Procedure: POLYPECTOMY;  Surgeon: West Bali, MD;  [facility]: [facility];  Service: Endoscopy;;  ascending and hepatic flexure, transverse   REMOVAL OF STONES N/A 03/15/2022   Procedure: STONE AND SLUDGE EXTRACTION;  Surgeon: Malissa Hippo, MD;  Location: [facility];  Service: Endoscopy;  Laterality: N/A;   TEE WITHOUT CARDIOVERSION N/A 08/27/2021   Procedure: TRANSESOPHAGEAL ECHOCARDIOGRAM (TEE);  Surgeon: Chrystie Nose, MD;  Location: [facility] ENDOSCOPY;  Service: Cardiovascular;   Laterality: N/A;   TUBAL LIGATION      SOCIAL HISTORY:  Social History   Socioeconomic History   Marital status: Widowed    Spouse name: Not on file   Number of children: Not on file   Years of education: Not on file   Highest education level: Not on file  Occupational History   Occupation: retired  Tobacco Use   Smoking status: Never   Smokeless tobacco: Never  Vaping Use   Vaping status: Never Used  Substance and Sexual Activity   Alcohol use: No    Alcohol/week: 0.0 standard drinks of alcohol   Drug use: No   Sexual activity: Yes    Birth control/protection: Post-menopausal  Other Topics Concern   Not on file  Social History Narrative   MARRIED FOR 23 YRS. 5 KIDS: #4 PRESENT TODAY(AGE 44)   Social Determinants of Health   Financial Resource Strain: Not on file  Food Insecurity: Not on file  Transportation Needs: Not on file  Physical Activity: Not on file  Stress: Not on file  Social Connections: Not on file  Intimate Partner Violence: Not on file    FAMILY HISTORY:  Family History  Problem Relation Age of Onset   Heart failure Mother    Hypertension Mother    Heart failure Sister    Hypertension Father    Cancer Sister    Dementia Sister    Colon cancer Neg Hx    Colon polyps Neg Hx     CURRENT MEDICATIONS:  Current Outpatient Medications  Medication Sig Dispense Refill   acetaminophen (TYLENOL) 325 MG tablet Take 650 mg by mouth every 6 (six) hours as needed for moderate pain.     anastrozole (ARIMIDEX) 1 MG tablet TAKE 1 TABLET(1 MG) BY MOUTH DAILY 90 tablet 3   apixaban (ELIQUIS) 5 MG TABS tablet Take 1 tablet (5 mg total) by mouth 2 (two) times daily. 60 tablet 0   atorvastatin (LIPITOR) 40 MG tablet Take 1 tablet (40 mg total) by mouth daily. (Patient taking differently: Take 40 mg by mouth at bedtime.) 30 tablet 0   Cholecalciferol (VITAMIN D) 125 MCG (5000 UT) CAPS Take 5,000 Units by mouth daily. 30 capsule 11   cholecalciferol (VITAMIN D3)  25 MCG (1000 UNIT) tablet Take 4,000 Units by mouth daily.     furosemide (LASIX) 40 MG tablet Take 1 tablet (40 mg) daily Monday, Wednesday, Friday. Take 2 tablets (80 mg total) all other days. 140 tablet 3   glucose blood test strip Use to check blood glucose fasting , before  lunch and dinner and after your largest meal 120 each 0   Insulin Pen Needle (BD AUTOSHIELD DUO) 30G X 5 MM MISC 30 gauge x 3/16" (pen needle,diabetic dual safty)     Lancets (ONETOUCH ULTRASOFT) lancets Use to check glucose 4 x daily 120 each 0   LANTUS SOLOSTAR 100 UNIT/ML Solostar Pen Inject 10 Units into the skin at bedtime. (Patient taking differently: Inject 25 Units into the skin at bedtime.) 15 mL 0   losartan (COZAAR) 100 MG tablet Take 100 mg by mouth daily.     metFORMIN (GLUCOPHAGE) 1000 MG tablet TAKE (1) TABLET BY MOUTH TWICE DAILY. 60 tablet 0   metoprolol succinate (TOPROL-XL) 50 MG 24 hr tablet Take 1 tablet (50 mg total) by mouth in the morning and at bedtime. Take with or immediately following a meal. 180 tablet 3   nitroGLYCERIN (NITROSTAT) 0.4 MG SL tablet Place 1 tablet (0.4 mg total) under the tongue every 5 (five) minutes as needed for chest pain. 25 tablet 0   potassium chloride (KLOR-CON) 10 MEQ tablet Take 10 mEq by mouth daily.     vitamin B-12 (CYANOCOBALAMIN) 500 MCG tablet Take 1 tablet (500 mcg total) by mouth daily. (Patient taking differently: Take 500 mcg by mouth 2 (two) times a week.)     No current facility-administered medications for this visit.    ALLERGIES:  Allergies  Allergen Reactions   Penicillins Hives, Itching and Other (See Comments)    Has tolerated ceftriaxone 7/22 Has patient had a PCN reaction causing immediate rash, facial/tongue/throat swelling, SOB or lightheadedness with hypotension: No Has patient had a PCN reaction causing severe rash involving mucus membranes or skin necrosis: Yes Has patient had a PCN reaction that required hospitalization No Has patient had  a PCN reaction occurring within the last 10 years: No If all of the above answers are "NO", then may proceed with Cephalosporin use.     LABORATORY DATA:  I have reviewed the labs as listed.     Latest Ref Rng & Units 11/01/2023    1:15 PM 05/04/2023    1:57 PM 11/03/2022    4:07 PM  CBC  WBC 4.0 - 10.5 K/uL 6.4  7.2  6.8   Hemoglobin 12.0 - 15.0 g/dL 16.1  09.6  04.5   Hematocrit 36.0 - 46.0 % 38.3  36.3  37.0   Platelets 150 - 400 K/uL 218  235  195       Latest Ref Rng & Units 11/01/2023    1:15 PM 05/04/2023    1:57 PM 11/03/2022    4:07 PM  CMP  Glucose 70 - 99 mg/dL 409  811  914   BUN 8 - 23 mg/dL 21  21  21    Creatinine 0.44 - 1.00 mg/dL 7.82  9.56  2.13   Sodium 135 - 145 mmol/L 139  136  140   Potassium 3.5 - 5.1 mmol/L 3.7  3.5  3.5   Chloride 98 - 111 mmol/L 103  103  105   CO2 22 - 32 mmol/L 26  23  26    Calcium 8.9 - 10.3 mg/dL 8.8  8.8  9.1   Total Protein 6.5 - 8.1 g/dL 7.2  7.0  7.3   Total Bilirubin <1.2 mg/dL 0.5  0.7  0.5   Alkaline Phos 38 - 126 U/L 73  67  69   AST 15 - 41 U/L 13  15  16    ALT 0 - 44 U/L  10  10  10      DIAGNOSTIC IMAGING:  I have independently reviewed the scans and discussed with the patient. CUP PACEART INCLINIC DEVICE CHECK  Result Date: 10/26/2023 Pacemaker check in clinic. Normal device function. Thresholds, sensing, impedances consistent with previous measurements. Device programmed to maximize longevity. 5 AT episodes w/ overall burden of <1% + eliquis. No high ventricular rates noted. Device programmed at appropriate safety margins. Histogram distribution appropriate for patient activity level. Device programmed to optimize intrinsic conduction. Estimated longevity 2.7 years. Patient enrolled in remote follow-up. Patient education completed.Raj Janus, RN    WRAP UP:  All questions were answered. The patient knows to call the clinic with any problems, questions or concerns.  Medical decision making: ***  Time spent on visit: I  spent {CHL ONC TIME VISIT - HYQMV:[id number]} counseling the patient face to face. The total time spent in the appointment was {CHL ONC TIME VISIT - WUXLK:[id number]} and more than 50% was on counseling.  Carnella Guadalajara, PA-C  ***

## 2023-11-08 ENCOUNTER — Inpatient Hospital Stay: Payer: Medicare HMO

## 2023-11-08 ENCOUNTER — Inpatient Hospital Stay: Payer: Medicare HMO | Admitting: Physician Assistant

## 2023-11-08 VITALS — BP 144/51 | HR 70 | Temp 97.6°F | Resp 18 | Wt 213.2 lb

## 2023-11-08 DIAGNOSIS — D649 Anemia, unspecified: Secondary | ICD-10-CM

## 2023-11-08 DIAGNOSIS — C50911 Malignant neoplasm of unspecified site of right female breast: Secondary | ICD-10-CM | POA: Diagnosis not present

## 2023-11-08 DIAGNOSIS — Z170 Estrogen receptor positive status [ER+]: Secondary | ICD-10-CM | POA: Diagnosis not present

## 2023-11-08 DIAGNOSIS — E538 Deficiency of other specified B group vitamins: Secondary | ICD-10-CM

## 2023-11-08 DIAGNOSIS — D508 Other iron deficiency anemias: Secondary | ICD-10-CM | POA: Diagnosis not present

## 2023-11-08 DIAGNOSIS — Z79899 Other long term (current) drug therapy: Secondary | ICD-10-CM | POA: Diagnosis not present

## 2023-11-08 DIAGNOSIS — D0581 Other specified type of carcinoma in situ of right breast: Secondary | ICD-10-CM

## 2023-11-08 DIAGNOSIS — Z79811 Long term (current) use of aromatase inhibitors: Secondary | ICD-10-CM | POA: Diagnosis not present

## 2023-11-08 MED ORDER — FERROUS SULFATE 325 (65 FE) MG PO TBEC: 325.0000 mg | DELAYED_RELEASE_TABLET | Freq: Every day | ORAL | 3 refills | Status: DC

## 2023-11-08 MED ORDER — CYANOCOBALAMIN 1000 MCG/ML IJ SOLN
1000.0000 ug | Freq: Once | INTRAMUSCULAR | Status: AC
  Administered 2023-11-08: 1000 ug via INTRAMUSCULAR
  Filled 2023-11-08: qty 1

## 2023-11-08 NOTE — Progress Notes (Signed)
Natalie Castro presents today for injection per the provider's orders.  Vitamin B12 administration without incident; injection site WNL; see MAR for injection details.  Patient tolerated procedure well and without incident.  No questions or complaints noted at this time. Patient discharged ambulatory and in stable condition with family member.

## 2023-11-08 NOTE — Patient Instructions (Signed)
[facility] [place] [facility] - [facility]  Discharge Instructions: Thank you for choosing [facility] to provide your oncology and hematology care.  If you have a lab appointment with the Cancer Center - please note that after April 8th, 2024, all labs will be drawn in the cancer center.  You do not have to check in or register with the main entrance as you have in the past but will complete your check-in in the cancer center.  Wear comfortable clothing and clothing appropriate for easy access to any Portacath or PICC line.   We strive to give you quality time with your provider. You may need to reschedule your appointment if you arrive late (15 or more minutes).  Arriving late affects you and other patients whose appointments are after yours.  Also, if you miss three or more appointments without notifying the office, you may be dismissed from the clinic at the provider's discretion.      For prescription refill requests, have your pharmacy contact our office and allow 72 hours for refills to be completed.    Today you received the Vitamin B12 injection.      To help prevent nausea and vomiting after your treatment, we encourage you to take your nausea medication as directed.  BELOW ARE SYMPTOMS THAT SHOULD BE REPORTED IMMEDIATELY: *FEVER GREATER THAN 100.4 F (38 C) OR HIGHER *CHILLS OR SWEATING *NAUSEA AND VOMITING THAT IS NOT CONTROLLED WITH YOUR NAUSEA MEDICATION *UNUSUAL SHORTNESS OF BREATH *UNUSUAL BRUISING OR BLEEDING *URINARY PROBLEMS (pain or burning when urinating, or frequent urination) *BOWEL PROBLEMS (unusual diarrhea, constipation, pain near the anus) TENDERNESS IN MOUTH AND THROAT WITH OR WITHOUT PRESENCE OF ULCERS (sore throat, sores in mouth, or a toothache) UNUSUAL RASH, SWELLING OR PAIN  UNUSUAL VAGINAL DISCHARGE OR ITCHING   Items with * indicate a potential emergency and should be followed up as soon as possible or go to the  Emergency Department if any problems should occur.  Please show the CHEMOTHERAPY ALERT CARD or IMMUNOTHERAPY ALERT CARD at check-in to the Emergency Department and triage nurse.  Should you have questions after your visit or need to cancel or reschedule your appointment, please contact [place] [facility] - A DEPT OF Eligha Bridegroom [facility] [phone]  and follow the prompts.  Office hours are 8:00 a.m. to 4:30 p.m. Monday - Friday. Please note that voicemails left after 4:00 p.m. may not be returned until the following business day.  We are closed weekends and major holidays. You have access to a nurse at all times for urgent questions. Please call the main number to the clinic [phone] and follow the prompts.  For any non-urgent questions, you may also contact your provider using MyChart. We now offer e-Visits for anyone 13 and older to request care online for non-urgent symptoms. For details visit [email or web address].   Also download the MyChart app! Go to the app store, search "MyChart", open the app, select [place], and log in with your MyChart username and password.

## 2023-11-08 NOTE — Patient Instructions (Signed)
[facility] at [facility] **VISIT SUMMARY & IMPORTANT INSTRUCTIONS **   You were seen today by Rojelio Brenner PA-C for your follow-up visit.    HISTORY OF BREAST CANCER Your mammogram from May 2024 showed postsurgical changes.  We will check another mammogram this month to make sure that these changes are stable and that there is not any evidence of recurrent breast cancer. Your physical exam and labs did not show any evidence of recurrent breast cancer at this time. Continue taking anastrozole daily.  ANEMIA You have mild anemia (low red blood cells). This is due to iron deficiency and vitamin B12 deficiency. Please start taking iron pill (ferrous sulfate 325 mg) every day. We will give vitamin B12 injections here at the [facility] once a week for the next 4 weeks, and then once a month.  FOLLOW-UP APPOINTMENT: Labs and phone visit in 3 months.  ** Thank you for trusting me with your healthcare!  I strive to provide all of my patients with quality care at each visit.  If you receive a survey for this visit, I would be so grateful to you for taking the time to provide feedback.  Thank you in advance!  ~ Ronn Smolinsky                   Dr. Doreatha Massed   &   Rojelio Brenner, PA-C   - - - - - - - - - - - - - - - - - -    Thank you for choosing [facility] at [facility] to provide your oncology and hematology care.  To afford each patient quality time with our provider, please arrive at least 15 minutes before your scheduled appointment time.   If you have a lab appointment with the Cancer Center please come in thru the Main Entrance and check in at the main information desk.  You need to re-schedule your appointment should you arrive 10 or more minutes late.  We strive to give you quality time with our providers, and arriving late affects you and other patients whose appointments are after yours.  Also, if you no show three or more  times for appointments you may be dismissed from the clinic at the providers discretion.     Again, thank you for choosing [facility].  Our hope is that these requests will decrease the amount of time that you wait before being seen by our physicians.       _____________________________________________________________  Should you have questions after your visit to [facility], please contact our office at [phone] and follow the prompts.  Our office hours are 8:00 a.m. and 4:30 p.m. Monday - Friday.  Please note that voicemails left after 4:00 p.m. may not be returned until the following business day.  We are closed weekends and major holidays.  You do have access to a nurse 24-7, just call the main number to the clinic [phone] and do not press any options, hold on the line and a nurse will answer the phone.    For prescription refill requests, have your pharmacy contact our office and allow 72 hours.

## 2023-11-15 ENCOUNTER — Ambulatory Visit (INDEPENDENT_AMBULATORY_CARE_PROVIDER_SITE_OTHER): Payer: Medicare Other

## 2023-11-15 ENCOUNTER — Inpatient Hospital Stay: Payer: Medicare HMO

## 2023-11-15 VITALS — BP 158/60 | HR 60 | Temp 99.4°F | Resp 18

## 2023-11-15 DIAGNOSIS — C50911 Malignant neoplasm of unspecified site of right female breast: Secondary | ICD-10-CM | POA: Diagnosis not present

## 2023-11-15 DIAGNOSIS — E538 Deficiency of other specified B group vitamins: Secondary | ICD-10-CM

## 2023-11-15 DIAGNOSIS — Z79811 Long term (current) use of aromatase inhibitors: Secondary | ICD-10-CM | POA: Diagnosis not present

## 2023-11-15 DIAGNOSIS — I442 Atrioventricular block, complete: Secondary | ICD-10-CM | POA: Diagnosis not present

## 2023-11-15 DIAGNOSIS — Z170 Estrogen receptor positive status [ER+]: Secondary | ICD-10-CM | POA: Diagnosis not present

## 2023-11-15 DIAGNOSIS — Z79899 Other long term (current) drug therapy: Secondary | ICD-10-CM | POA: Diagnosis not present

## 2023-11-15 DIAGNOSIS — D649 Anemia, unspecified: Secondary | ICD-10-CM | POA: Diagnosis not present

## 2023-11-15 LAB — CUP PACEART REMOTE DEVICE CHECK
Battery Remaining Longevity: 31 mo
Battery Remaining Percentage: 28 %
Battery Voltage: 2.95 V
Brady Statistic AP VP Percent: 61 %
Brady Statistic AP VS Percent: 1 %
Brady Statistic AS VP Percent: 39 %
Brady Statistic AS VS Percent: 1 %
Brady Statistic RA Percent Paced: 60 %
Brady Statistic RV Percent Paced: 99 %
Date Time Interrogation Session: 20241120024510
Implantable Lead Connection Status: 753985
Implantable Lead Connection Status: 753985
Implantable Lead Implant Date: 20161129
Implantable Lead Implant Date: 20161129
Implantable Lead Location: 753859
Implantable Lead Location: 753860
Implantable Lead Model: 5076
Implantable Pulse Generator Implant Date: 20161129
Lead Channel Impedance Value: 380 Ohm
Lead Channel Impedance Value: 490 Ohm
Lead Channel Pacing Threshold Amplitude: 0.75 V
Lead Channel Pacing Threshold Amplitude: 0.875 V
Lead Channel Pacing Threshold Pulse Width: 0.4 ms
Lead Channel Pacing Threshold Pulse Width: 0.4 ms
Lead Channel Sensing Intrinsic Amplitude: 10 mV
Lead Channel Sensing Intrinsic Amplitude: 3.9 mV
Lead Channel Setting Pacing Amplitude: 1.875
Lead Channel Setting Pacing Amplitude: 2 V
Lead Channel Setting Pacing Pulse Width: 0.4 ms
Lead Channel Setting Sensing Sensitivity: 4 mV
Pulse Gen Model: 2240
Pulse Gen Serial Number: 7826037

## 2023-11-15 MED ORDER — CYANOCOBALAMIN 1000 MCG/ML IJ SOLN
1000.0000 ug | Freq: Once | INTRAMUSCULAR | Status: AC
  Administered 2023-11-15: 1000 ug via INTRAMUSCULAR
  Filled 2023-11-15: qty 1

## 2023-11-15 NOTE — Progress Notes (Signed)
Natalie Castro presents today for injection per the provider's orders.  B12 administration without incident; injection site WNL; see MAR for injection details.  Patient tolerated procedure well and without incident.  No questions or complaints noted at this time.

## 2023-11-15 NOTE — Patient Instructions (Signed)
[facility]  Discharge Instructions  You received a B12 injection today.   Return as scheduled next week for your final weekly B12 injection.    Thank you for choosing [facility] to provide your oncology and hematology care.   To afford each patient quality time with our provider, please arrive at least 15 minutes before your scheduled appointment time. You may need to reschedule your appointment if you arrive late (10 or more minutes). Arriving late affects you and other patients whose appointments are after yours.  Also, if you miss three or more appointments without notifying the office, you may be dismissed from the clinic at the provider's discretion.    Again, thank you for choosing [facility].  Our hope is that these requests will decrease the amount of time that you wait before being seen by our physicians.   If you have a lab appointment with the Cancer Center - please note that after April 8th, all labs will be drawn in the cancer center.  You do not have to check in or register with the main entrance as you have in the past but will complete your check-in at the cancer center.            _____________________________________________________________  Should you have questions after your visit to [facility] please contact our office at [phone] and follow the prompts.  Our office hours are 8:00 a.m. to 4:30 p.m. Monday - Thursday and 8:00 a.m. to 2:30 p.m. Friday.  Please note that voicemails left after 4:00 p.m. may not be returned until the following business day.  We are closed weekends and all major holidays.  You do have access to a nurse 24-7, just call the main number to the clinic [phone] and do not press any options, hold on the line and a nurse will answer the phone.    For prescription refill requests, have your pharmacy contact our office and allow 72 hours.    Masks are no  longer required in the cancer centers. If you would like for your care team to wear a mask while they are taking care of you, please let them know. You may have one support person who is at least 81 years old accompany you for your appointments.

## 2023-11-21 DIAGNOSIS — E119 Type 2 diabetes mellitus without complications: Secondary | ICD-10-CM | POA: Diagnosis not present

## 2023-11-21 DIAGNOSIS — Z794 Long term (current) use of insulin: Secondary | ICD-10-CM | POA: Diagnosis not present

## 2023-11-22 ENCOUNTER — Inpatient Hospital Stay: Payer: Medicare HMO

## 2023-11-22 VITALS — BP 143/62 | HR 60 | Temp 98.0°F | Resp 18

## 2023-11-22 DIAGNOSIS — E538 Deficiency of other specified B group vitamins: Secondary | ICD-10-CM | POA: Diagnosis not present

## 2023-11-22 DIAGNOSIS — D649 Anemia, unspecified: Secondary | ICD-10-CM | POA: Diagnosis not present

## 2023-11-22 DIAGNOSIS — Z79811 Long term (current) use of aromatase inhibitors: Secondary | ICD-10-CM | POA: Diagnosis not present

## 2023-11-22 DIAGNOSIS — Z170 Estrogen receptor positive status [ER+]: Secondary | ICD-10-CM | POA: Diagnosis not present

## 2023-11-22 DIAGNOSIS — C50911 Malignant neoplasm of unspecified site of right female breast: Secondary | ICD-10-CM | POA: Diagnosis not present

## 2023-11-22 DIAGNOSIS — Z79899 Other long term (current) drug therapy: Secondary | ICD-10-CM | POA: Diagnosis not present

## 2023-11-22 MED ORDER — CYANOCOBALAMIN 1000 MCG/ML IJ SOLN
1000.0000 ug | Freq: Once | INTRAMUSCULAR | Status: AC
  Administered 2023-11-22: 1000 ug via INTRAMUSCULAR
  Filled 2023-11-22: qty 1

## 2023-11-22 NOTE — Patient Instructions (Signed)
[place] [facility] - [facility]  Discharge Instructions: Thank you for choosing [facility] to provide your oncology and hematology care.  If you have a lab appointment with the Cancer Center - please note that after April 8th, 2024, all labs will be drawn in the cancer center.  You do not have to check in or register with the main entrance as you have in the past but will complete your check-in in the cancer center.  Wear comfortable clothing and clothing appropriate for easy access to any Portacath or PICC line.   We strive to give you quality time with your provider. You may need to reschedule your appointment if you arrive late (15 or more minutes).  Arriving late affects you and other patients whose appointments are after yours.  Also, if you miss three or more appointments without notifying the office, you may be dismissed from the clinic at the provider's discretion.      For prescription refill requests, have your pharmacy contact our office and allow 72 hours for refills to be completed.    Today you received the following:  Vitamin B12   Vitamin B12 Injection What is this medication? Vitamin B12 (VAHY tuh min B12) prevents and treats low vitamin B12 levels in your body. It is used in people who do not get enough vitamin B12 from their diet or when their digestive tract does not absorb enough. Vitamin B12 plays an important role in maintaining the health of your nervous system and red blood cells. This medicine may be used for other purposes; ask your health care provider or pharmacist if you have questions. COMMON BRAND NAME(S): B-12 Compliance Kit, B-12 Injection Kit, Cyomin, Dodex, LA-12, Nutri-Twelve, Physicians EZ Use B-12, Primabalt, Vitamin Deficiency Injectable System - B12 What should I tell my care team before I take this medication? They need to know if you have any of these conditions: Kidney disease Leber's  disease Megaloblastic anemia An unusual or allergic reaction to cyanocobalamin, cobalt, other medications, foods, dyes, or preservatives Pregnant or trying to get pregnant Breast-feeding How should I use this medication? This medication is injected into a muscle or deeply under the skin. It is usually given in a clinic or care team's office. However, your care team may teach you how to inject yourself. Follow all instructions. Talk to your care team about the use of this medication in children. Special care may be needed. Overdosage: If you think you have taken too much of this medicine contact a poison control center or emergency room at once. NOTE: This medicine is only for you. Do not share this medicine with others. What if I miss a dose? If you are given your dose at a clinic or care team's office, call to reschedule your appointment. If you give your own injections, and you miss a dose, take it as soon as you can. If it is almost time for your next dose, take only that dose. Do not take double or extra doses. What may interact with this medication? Alcohol Colchicine This list may not describe all possible interactions. Give your health care provider a list of all the medicines, herbs, non-prescription drugs, or dietary supplements you use. Also tell them if you smoke, drink alcohol, or use illegal drugs. Some items may interact with your medicine. What should I watch for while using this medication? Visit your care team regularly. You may need blood work done while you are  taking this medication. You may need to follow a special diet. Talk to your care team. Limit your alcohol intake and avoid smoking to get the best benefit. What side effects may I notice from receiving this medication? Side effects that you should report to your care team as soon as possible: Allergic reactions--skin rash, itching, hives, swelling of the face, lips, tongue, or throat Swelling of the ankles, hands, or  feet Trouble breathing Side effects that usually do not require medical attention (report to your care team if they continue or are bothersome): Diarrhea This list may not describe all possible side effects. Call your doctor for medical advice about side effects. You may report side effects to FDA at 1-800-FDA-1088. Where should I keep my medication? Keep out of the reach of children. Store at room temperature between 15 and 30 degrees C (59 and 85 degrees F). Protect from light. Throw away any unused medication after the expiration date. NOTE: This sheet is a summary. It may not cover all possible information. If you have questions about this medicine, talk to your doctor, pharmacist, or health care provider.  2024 Elsevier/Gold Standard (2021-08-24 00:00:00)      To help prevent nausea and vomiting after your treatment, we encourage you to take your nausea medication as directed.  BELOW ARE SYMPTOMS THAT SHOULD BE REPORTED IMMEDIATELY: *FEVER GREATER THAN 100.4 F (38 C) OR HIGHER *CHILLS OR SWEATING *NAUSEA AND VOMITING THAT IS NOT CONTROLLED WITH YOUR NAUSEA MEDICATION *UNUSUAL SHORTNESS OF BREATH *UNUSUAL BRUISING OR BLEEDING *URINARY PROBLEMS (pain or burning when urinating, or frequent urination) *BOWEL PROBLEMS (unusual diarrhea, constipation, pain near the anus) TENDERNESS IN MOUTH AND THROAT WITH OR WITHOUT PRESENCE OF ULCERS (sore throat, sores in mouth, or a toothache) UNUSUAL RASH, SWELLING OR PAIN  UNUSUAL VAGINAL DISCHARGE OR ITCHING   Items with * indicate a potential emergency and should be followed up as soon as possible or go to the Emergency Department if any problems should occur.  Please show the CHEMOTHERAPY ALERT CARD or IMMUNOTHERAPY ALERT CARD at check-in to the Emergency Department and triage nurse.  Should you have questions after your visit or need to cancel or reschedule your appointment, please contact [place] [facility] - A DEPT OF [facility]igha Bridegroom [facility] [phone]  and follow the prompts.  Office hours are 8:00 a.m. to 4:30 p.m. Monday - Friday. Please note that voicemails left after 4:00 p.m. may not be returned until the following business day.  We are closed weekends and major holidays. You have access to a nurse at all times for urgent questions. Please call the main number to the clinic [phone] and follow the prompts.  For any non-urgent questions, you may also contact your provider using MyChart. We now offer e-Visits for anyone 83 and older to request care online for non-urgent symptoms. For details visit [email or web address].   Also download the MyChart app! Go to the app store, search "MyChart", open the app, select [place], and log in with your MyChart username and password.

## 2023-11-22 NOTE — Progress Notes (Signed)
Patient tolerated Vitamin B 12 injection with no complaints voiced.  Site clean and dry with no bruising or swelling noted.  No complaints of pain.  Discharged with vital signs stable and no signs or symptoms of distress noted.   ?

## 2023-11-29 ENCOUNTER — Inpatient Hospital Stay: Payer: Medicare HMO | Attending: Hematology

## 2023-11-29 VITALS — BP 141/58 | HR 60 | Temp 98.8°F | Resp 18

## 2023-11-29 DIAGNOSIS — E538 Deficiency of other specified B group vitamins: Secondary | ICD-10-CM | POA: Diagnosis not present

## 2023-11-29 MED ORDER — CYANOCOBALAMIN 1000 MCG/ML IJ SOLN
1000.0000 ug | Freq: Once | INTRAMUSCULAR | Status: AC
  Administered 2023-11-29: 1000 ug via INTRAMUSCULAR
  Filled 2023-11-29: qty 1

## 2023-11-29 NOTE — Patient Instructions (Signed)
[facility] [place] [facility]  Discharge Instructions: Thank you for choosing [facility] to provide your oncology and hematology care.  If you have a lab appointment with the Cancer Center - please note that after April 8th, 2024, all labs will be drawn in the cancer center.  You do not have to check in or register with the main entrance as you have in the past but will complete your check-in in the cancer center.  Wear comfortable clothing and clothing appropriate for easy access to any Portacath or PICC line.   We strive to give you quality time with your provider. You may need to reschedule your appointment if you arrive late (15 or more minutes).  Arriving late affects you and other patients whose appointments are after yours.  Also, if you miss three or more appointments without notifying the office, you may be dismissed from the clinic at the provider's discretion.      For prescription refill requests, have your pharmacy contact our office and allow 72 hours for refills to be completed.    Today you received B12 injection     BELOW ARE SYMPTOMS THAT SHOULD BE REPORTED IMMEDIATELY: *FEVER GREATER THAN 100.4 F (38 C) OR HIGHER *CHILLS OR SWEATING *NAUSEA AND VOMITING THAT IS NOT CONTROLLED WITH YOUR NAUSEA MEDICATION *UNUSUAL SHORTNESS OF BREATH *UNUSUAL BRUISING OR BLEEDING *URINARY PROBLEMS (pain or burning when urinating, or frequent urination) *BOWEL PROBLEMS (unusual diarrhea, constipation, pain near the anus) TENDERNESS IN MOUTH AND THROAT WITH OR WITHOUT PRESENCE OF ULCERS (sore throat, sores in mouth, or a toothache) UNUSUAL RASH, SWELLING OR PAIN  UNUSUAL VAGINAL DISCHARGE OR ITCHING   Items with * indicate a potential emergency and should be followed up as soon as possible or go to the Emergency Department if any problems should occur.  Please show the CHEMOTHERAPY ALERT CARD or IMMUNOTHERAPY ALERT CARD at check-in to  the Emergency Department and triage nurse.  Should you have questions after your visit or need to cancel or reschedule your appointment, please contact [facility] [place] - A DEPT OF [facility]igh[facility] Bridegroom [facility] [phone]  and follow the prompts.  Office hours are 8:00 a.m. to 4:30 p.m. Monday - Friday. Please note that voicemails left after 4:00 p.m. may not be returned until the following business day.  We are closed weekends and major holidays. You have access to a nurse at all times for urgent questions. Please call the main number to the clinic [phone] and follow the prompts.  For any non-urgent questions, you may also contact your provider using MyChart. We now offer e-Visits for anyone 57 and older to request care online for non-urgent symptoms. For details visit [email or web address].   Also download the MyChart app! Go to the app store, search "MyChart", open the app, select [place], and log in with your MyChart username and password.

## 2023-11-29 NOTE — Progress Notes (Signed)
Natalie Castro presents today for injection per the provider's orders.  B12 administration without incident; injection site WNL; see MAR for injection details.  Patient tolerated procedure well and without incident.  No questions or complaints noted at this time.   Discharged from clinic via wheelchair in stable condition. Alert and oriented x 3. F/U with [facility] as scheduled.

## 2023-11-30 DIAGNOSIS — E785 Hyperlipidemia, unspecified: Secondary | ICD-10-CM | POA: Diagnosis not present

## 2023-11-30 DIAGNOSIS — Z23 Encounter for immunization: Secondary | ICD-10-CM | POA: Diagnosis not present

## 2023-11-30 DIAGNOSIS — Z86711 Personal history of pulmonary embolism: Secondary | ICD-10-CM | POA: Diagnosis not present

## 2023-11-30 DIAGNOSIS — Z950 Presence of cardiac pacemaker: Secondary | ICD-10-CM | POA: Diagnosis not present

## 2023-11-30 DIAGNOSIS — I5030 Unspecified diastolic (congestive) heart failure: Secondary | ICD-10-CM | POA: Diagnosis not present

## 2023-11-30 DIAGNOSIS — I10 Essential (primary) hypertension: Secondary | ICD-10-CM | POA: Diagnosis not present

## 2023-11-30 DIAGNOSIS — Z853 Personal history of malignant neoplasm of breast: Secondary | ICD-10-CM | POA: Diagnosis not present

## 2023-11-30 DIAGNOSIS — E1165 Type 2 diabetes mellitus with hyperglycemia: Secondary | ICD-10-CM | POA: Diagnosis not present

## 2023-11-30 DIAGNOSIS — I130 Hypertensive heart and chronic kidney disease with heart failure and stage 1 through stage 4 chronic kidney disease, or unspecified chronic kidney disease: Secondary | ICD-10-CM | POA: Diagnosis not present

## 2023-12-11 NOTE — Progress Notes (Signed)
Remote pacemaker transmission.   

## 2023-12-14 ENCOUNTER — Ambulatory Visit (HOSPITAL_COMMUNITY)
Admission: RE | Admit: 2023-12-14 | Discharge: 2023-12-14 | Disposition: A | Discharge status: Home / Self Care | Payer: Medicare HMO | Source: Ambulatory Visit | Attending: Physician Assistant | Admitting: Physician Assistant

## 2023-12-14 ENCOUNTER — Encounter (HOSPITAL_COMMUNITY): Payer: Self-pay

## 2023-12-14 DIAGNOSIS — D0581 Other specified type of carcinoma in situ of right breast: Secondary | ICD-10-CM | POA: Insufficient documentation

## 2023-12-14 DIAGNOSIS — R92321 Mammographic fibroglandular density, right breast: Secondary | ICD-10-CM | POA: Diagnosis not present

## 2023-12-14 DIAGNOSIS — R928 Other abnormal and inconclusive findings on diagnostic imaging of breast: Secondary | ICD-10-CM | POA: Diagnosis not present

## 2023-12-18 ENCOUNTER — Other Ambulatory Visit: Payer: Self-pay | Admitting: Internal Medicine

## 2023-12-18 ENCOUNTER — Ambulatory Visit (INDEPENDENT_AMBULATORY_CARE_PROVIDER_SITE_OTHER): Payer: Medicare HMO | Admitting: Podiatry

## 2023-12-18 ENCOUNTER — Encounter: Payer: Self-pay | Admitting: Podiatry

## 2023-12-18 VITALS — Ht 65.0 in | Wt 213.2 lb

## 2023-12-18 DIAGNOSIS — M79675 Pain in left toe(s): Secondary | ICD-10-CM

## 2023-12-18 DIAGNOSIS — M79674 Pain in right toe(s): Secondary | ICD-10-CM | POA: Diagnosis not present

## 2023-12-18 DIAGNOSIS — I442 Atrioventricular block, complete: Secondary | ICD-10-CM

## 2023-12-18 DIAGNOSIS — B351 Tinea unguium: Secondary | ICD-10-CM

## 2023-12-18 DIAGNOSIS — E119 Type 2 diabetes mellitus without complications: Secondary | ICD-10-CM

## 2023-12-18 NOTE — Progress Notes (Signed)
This patient returns to my office for at risk foot care.  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 This patient is unable to cut nails herself since the patient cannot reach her nails.These nails are painful walking and wearing shoes.  This patient presents for at risk foot care today.  General Appearance  Alert, conversant and in no acute stress.  Vascular  Dorsalis pedis and posterior tibial  pulses are  weakly palpable  bilaterally.  Capillary return is within normal limits  bilaterally. Temperature is within normal limits  bilaterally.  Neurologic  Senn-Weinstein monofilament wire test within normal limits  bilaterally. Muscle power within normal limits bilaterally.  Nails Thick disfigured discolored nails with subungual debris  from hallux to fifth toes bilaterally. No evidence of bacterial infection or drainage bilaterally.  Orthopedic  No limitations of motion  feet .  No crepitus or effusions noted.  No bony pathology or digital deformities noted.  Skin  normotropic skin with no porokeratosis noted bilaterally.  No signs of infections or ulcers noted.     Onychomycosis  Pain in right toes  Pain in left toes  Consent was obtained for treatment procedures.   Mechanical debridement of nails 1-5  bilaterally performed with a nail nipper.  Filed with dremel without incident.    Return office visit    4 months                  Told patient to return for periodic foot care and evaluation due to potential at risk complications.   Helane Gunther DPM

## 2023-12-27 ENCOUNTER — Encounter: Payer: Self-pay | Admitting: Hematology

## 2024-01-01 ENCOUNTER — Inpatient Hospital Stay: Payer: Medicare Other

## 2024-01-02 ENCOUNTER — Inpatient Hospital Stay: Payer: Medicare Other | Attending: Hematology

## 2024-01-02 VITALS — BP 132/57 | HR 60 | Temp 98.8°F | Resp 18

## 2024-01-02 DIAGNOSIS — E538 Deficiency of other specified B group vitamins: Secondary | ICD-10-CM | POA: Insufficient documentation

## 2024-01-02 MED ORDER — CYANOCOBALAMIN 1000 MCG/ML IJ SOLN
1000.0000 ug | Freq: Once | INTRAMUSCULAR | Status: AC
  Administered 2024-01-02: 1000 ug via INTRAMUSCULAR
  Filled 2024-01-02: qty 1

## 2024-01-02 NOTE — Patient Instructions (Signed)
 [facility] [place] [facility]  Discharge Instructions: Thank you for choosing [facility] to provide your oncology and hematology care.  If you have a lab appointment with the Cancer Center - please note that after April 8th, 2024, all labs will be drawn in the cancer center.  You do not have to check in or register with the main entrance as you have in the past but will complete your check-in in the cancer center.  Wear comfortable clothing and clothing appropriate for easy access to any Portacath or PICC line.   We strive to give you quality time with your provider. You may need to reschedule your appointment if you arrive late (15 or more minutes).  Arriving late affects you and other patients whose appointments are after yours.  Also, if you miss three or more appointments without notifying the office, you may be dismissed from the clinic at the provider's discretion.      For prescription refill requests, have your pharmacy contact our office and allow 72 hours for refills to be completed.    Today you received the following B12 injection, return as scheduled.   To help prevent nausea and vomiting after your treatment, we encourage you to take your nausea medication as directed.  BELOW ARE SYMPTOMS THAT SHOULD BE REPORTED IMMEDIATELY: *FEVER GREATER THAN 100.4 F (38 C) OR HIGHER *CHILLS OR SWEATING *NAUSEA AND VOMITING THAT IS NOT CONTROLLED WITH YOUR NAUSEA MEDICATION *UNUSUAL SHORTNESS OF BREATH *UNUSUAL BRUISING OR BLEEDING *URINARY PROBLEMS (pain or burning when urinating, or frequent urination) *BOWEL PROBLEMS (unusual diarrhea, constipation, pain near the anus) TENDERNESS IN MOUTH AND THROAT WITH OR WITHOUT PRESENCE OF ULCERS (sore throat, sores in mouth, or a toothache) UNUSUAL RASH, SWELLING OR PAIN  UNUSUAL VAGINAL DISCHARGE OR ITCHING   Items with * indicate a potential emergency and should be followed up as soon as  possible or go to the Emergency Department if any problems should occur.  Please show the CHEMOTHERAPY ALERT CARD or IMMUNOTHERAPY ALERT CARD at check-in to the Emergency Department and triage nurse.  Should you have questions after your visit or need to cancel or reschedule your appointment, please contact [facility] [place] - A DEPT OF Eligha Bridegroom [facility] [phone]  and follow the prompts.  Office hours are 8:00 a.m. to 4:30 p.m. Monday - Friday. Please note that voicemails left after 4:00 p.m. may not be returned until the following business day.  We are closed weekends and major holidays. You have access to a nurse at all times for urgent questions. Please call the main number to the clinic [phone] and follow the prompts.  For any non-urgent questions, you may also contact your provider using MyChart. We now offer e-Visits for anyone 69 and older to request care online for non-urgent symptoms. For details visit [email or web address].   Also download the MyChart app! Go to the app store, search "MyChart", open the app, select [place], and log in with your MyChart username and password.

## 2024-01-02 NOTE — Progress Notes (Signed)
 Patient tolerated injection with no complaints voiced. Site clean and dry with no bruising or swelling noted at site. See MAR for details. Band aid applied.  Patient stable during and after injection. VSS with discharge and left in satisfactory condition with no s/s of distress noted.

## 2024-02-01 ENCOUNTER — Inpatient Hospital Stay: Payer: Medicare Other | Attending: Hematology

## 2024-02-01 ENCOUNTER — Inpatient Hospital Stay: Payer: Medicare Other

## 2024-02-01 VITALS — BP 136/51 | HR 61 | Temp 97.9°F | Resp 18

## 2024-02-01 DIAGNOSIS — D509 Iron deficiency anemia, unspecified: Secondary | ICD-10-CM | POA: Insufficient documentation

## 2024-02-01 DIAGNOSIS — Z86000 Personal history of in-situ neoplasm of breast: Secondary | ICD-10-CM | POA: Insufficient documentation

## 2024-02-01 DIAGNOSIS — E538 Deficiency of other specified B group vitamins: Secondary | ICD-10-CM | POA: Diagnosis present

## 2024-02-01 DIAGNOSIS — D508 Other iron deficiency anemias: Secondary | ICD-10-CM

## 2024-02-01 DIAGNOSIS — D649 Anemia, unspecified: Secondary | ICD-10-CM

## 2024-02-01 LAB — CBC WITH DIFFERENTIAL/PLATELET
Abs Immature Granulocytes: 0.03 10*3/uL (ref 0.00–0.07)
Basophils Absolute: 0 10*3/uL (ref 0.0–0.1)
Basophils Relative: 0 %
Eosinophils Absolute: 0.3 10*3/uL (ref 0.0–0.5)
Eosinophils Relative: 4 %
HCT: 37.2 % (ref 36.0–46.0)
Hemoglobin: 12 g/dL (ref 12.0–15.0)
Immature Granulocytes: 0 %
Lymphocytes Relative: 31 %
Lymphs Abs: 2.2 10*3/uL (ref 0.7–4.0)
MCH: 30 pg (ref 26.0–34.0)
MCHC: 32.3 g/dL (ref 30.0–36.0)
MCV: 93 fL (ref 80.0–100.0)
Monocytes Absolute: 0.6 10*3/uL (ref 0.1–1.0)
Monocytes Relative: 9 %
Neutro Abs: 3.9 10*3/uL (ref 1.7–7.7)
Neutrophils Relative %: 56 %
Platelets: 195 10*3/uL (ref 150–400)
RBC: 4 MIL/uL (ref 3.87–5.11)
RDW: 12.7 % (ref 11.5–15.5)
WBC: 7.1 10*3/uL (ref 4.0–10.5)
nRBC: 0 % (ref 0.0–0.2)

## 2024-02-01 LAB — FERRITIN: Ferritin: 57 ng/mL (ref 11–307)

## 2024-02-01 LAB — IRON AND TIBC
Iron: 43 ug/dL (ref 28–170)
Saturation Ratios: 18 % (ref 10.4–31.8)
TIBC: 237 ug/dL — ABNORMAL LOW (ref 250–450)
UIBC: 194 ug/dL

## 2024-02-01 LAB — VITAMIN B12: Vitamin B-12: 257 pg/mL (ref 180–914)

## 2024-02-01 MED ORDER — CYANOCOBALAMIN 1000 MCG/ML IJ SOLN
1000.0000 ug | Freq: Once | INTRAMUSCULAR | Status: AC
  Administered 2024-02-01: 1000 ug via INTRAMUSCULAR
  Filled 2024-02-01: qty 1

## 2024-02-01 NOTE — Patient Instructions (Signed)
 [facility] [place] - [facility]  Discharge Instructions: Thank you for choosing [facility] to provide your oncology and hematology care.  If you have a lab appointment with the Cancer Center - please note that after April 8th, 2024, all labs will be drawn in the cancer center.  You do not have to check in or register with the main entrance as you have in the past but will complete your check-in in the cancer center.  Wear comfortable clothing and clothing appropriate for easy access to any Portacath or PICC line.   We strive to give you quality time with your provider. You may need to reschedule your appointment if you arrive late (15 or more minutes).  Arriving late affects you and other patients whose appointments are after yours.  Also, if you miss three or more appointments without notifying the office, you may be dismissed from the clinic at the provider's discretion.      For prescription refill requests, have your pharmacy contact our office and allow 72 hours for refills to be completed.    Today you received B12 injection     BELOW ARE SYMPTOMS THAT SHOULD BE REPORTED IMMEDIATELY: *FEVER GREATER THAN 100.4 F (38 C) OR HIGHER *CHILLS OR SWEATING *NAUSEA AND VOMITING THAT IS NOT CONTROLLED WITH YOUR NAUSEA MEDICATION *UNUSUAL SHORTNESS OF BREATH *UNUSUAL BRUISING OR BLEEDING *URINARY PROBLEMS (pain or burning when urinating, or frequent urination) *BOWEL PROBLEMS (unusual diarrhea, constipation, pain near the anus) TENDERNESS IN MOUTH AND THROAT WITH OR WITHOUT PRESENCE OF ULCERS (sore throat, sores in mouth, or a toothache) UNUSUAL RASH, SWELLING OR PAIN  UNUSUAL VAGINAL DISCHARGE OR ITCHING   Items with * indicate a potential emergency and should be followed up as soon as possible or go to the Emergency Department if any problems should occur.  Please show the CHEMOTHERAPY ALERT CARD or IMMUNOTHERAPY ALERT CARD at check-in to  the Emergency Department and triage nurse.  Should you have questions after your visit or need to cancel or reschedule your appointment, please contact [facility] [place] - A DEPT OF [facility] [phone]  and follow the prompts.  Office hours are 8:00 a.m. to 4:30 p.m. Monday - Friday. Please note that voicemails left after 4:00 p.m. may not be returned until the following business day.  We are closed weekends and major holidays. You have access to a nurse at all times for urgent questions. Please call the main number to the clinic [phone] and follow the prompts.  For any non-urgent questions, you may also contact your provider using MyChart. We now offer e-Visits for anyone 57 and older to request care online for non-urgent symptoms. For details visit [email or web address].   Also download the MyChart app! Go to the app store, search "MyChart", open the app, select [place], and log in with your MyChart username and password.

## 2024-02-01 NOTE — Progress Notes (Signed)
 Natalie Castro presents today for injection per the provider's orders.  B12 administration without incident; injection site WNL; see MAR for injection details.  Patient tolerated procedure well and without incident.  No questions or complaints noted at this time.   Discharged from clinic via wheelchair in stable condition. Alert and oriented x 3. F/U with [facility] as scheduled.

## 2024-02-02 NOTE — Progress Notes (Signed)
 VIRTUAL VISIT via TELEPHONE NOTE [name]   I connected with Natalie Castro  on 02/05/24 at  11:00 AM by telephone and verified that I am speaking with the correct person using two identifiers.  Location: Patient: Home Provider: [facility]   I discussed the limitations, risks, security and privacy concerns of performing an evaluation and management service by telephone and the availability of in person appointments. I also discussed with the patient that there may be a patient responsible charge related to this service. The patient expressed understanding and agreed to proceed.  REASON FOR VISIT: Vitamin B12 deficiency **Patient also follows at our clinic for her history of right breast DCIS, which was addressed in full at visit on 11/08/2023, and was not addressed during today's visit.  CURRENT THERAPY: Monthly B12 injections  INTERVAL HISTORY:  [name] is contacted today for follow-up of vitamin B12 deficiency.  She was last seen by Sheril Dines PA-C on 11/08/2023.  Since her visit in November 2024, patient has been receiving vitamin B12 repletion via injection.  She has also been taking iron tablet every day.  She has occasional scant rectal bleeding when wiping, but denies any gross hematochezia or melena.  She has low energy that waxes and wanes.  She has some numbness and tingling in her arms and legs and reports occasional lightheadedness.  She denies any syncopal episodes, pica, or mental status changes.  She reports 25% energy and 100% appetite.  She is maintaining a stable weight at this time.  ASSESSMENT & PLAN:  1.  Mild normocytic anemia secondary to vitamin B12 and iron deficiencies - Labs from 11/01/2023 show Hgb 11.9/MCV 93.0.  Ferritin 37, iron saturation 30%.  Vitamin B12 significantly low at 61.  Normal folate.  Creatinine 1.02/GFR 55 - Failed to improve on oral B12 repletion. - Taking ferrous sulfate  325 mg daily since  November 2024.  Vitamin B12 injections since November 2024. - Most recent labs (02/01/2024): Hgb 12.0/MCV 93.0, ferritin 57, iron saturation 18% with low TIBC 237.  Vitamin B12 improved at 257, MMA pending  - No rectal bleeding or melena - Symptomatic with fatigue - PLAN: Continue monthly B12 injections. - Continue ferrous sulfate  325 mg daily. - Recheck labs at follow-up  2.  Right breast DCIS (diagnosed 2023) - Patient also follows at our clinic for her history of right breast DCIS, which was addressed in full at visit on 11/08/2023, and was not addressed during today's visit. - Diagnostic mammogram of right breast was performed on 12/14/2023, which showed stable right breast asymmetry felt to be related to lumpectomy changes.  No mammographic evidence of malignancy.  Recommended for bilateral diagnostic mammography in 6 months (June 2025) - PLAN: Labs and office visit for breast exam in May 2025 (3 months) - Next mammogram due June 2025  3.  Bone health (DEXA 05/25/2022 T score -0.7): - Vitamin D  level from 11/01/2023 was normal at 49.95.  Calcium  8.8 (corrected calcium  normal). - PLAN: Continue vitamin D  4000 units daily. - Repeat DEXA scan due around 05/25/2024   4.  Social/family history: - She lives at home with her daughter.  Uses rollator for ambulation secondary to arthritis.  She is able to do all her ADLs.  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  Non-smoker. - Sister had metastatic breast cancer in her 12s.   PLAN SUMMARY: >> Monthly B12 injections >>  Labs in 3 months = CBC/D, CMP, vitamin D , B12, MMA, ferritin, iron/TIBC >> OFFICE visit in 3 months (1 week after labs)     REVIEW OF SYSTEMS:   Review of Systems  Constitutional:  Negative for chills, diaphoresis, fever, malaise/fatigue and weight loss.  Respiratory:  Positive for cough and shortness of breath.   Cardiovascular:  Positive for chest pain. Negative for palpitations.   Gastrointestinal:  Positive for diarrhea. Negative for abdominal pain, blood in stool, melena, nausea and vomiting.  Neurological:  Positive for dizziness, tingling and headaches.     PHYSICAL EXAM: (per limitations of virtual telephone visit)  The patient is alert and oriented x 3, exhibiting adequate mentation, good mood, and ability to speak in full sentences and execute sound judgement.  WRAP UP:   I discussed the assessment and treatment plan with the patient. The patient was provided an opportunity to ask questions and all were answered. The patient agreed with the plan and demonstrated an understanding of the instructions.   The patient was advised to call back or seek an in-person evaluation if the symptoms worsen or if the condition fails to improve as anticipated.  I provided 18 minutes of non-face-to-face time during this encounter, including > 10 minutes of medical discussion.  Sonnie Dusky, PA-C 02/05/24 12:12 PM

## 2024-02-05 ENCOUNTER — Inpatient Hospital Stay: Payer: Medicare Other | Admitting: Physician Assistant

## 2024-02-05 DIAGNOSIS — Z86000 Personal history of in-situ neoplasm of breast: Secondary | ICD-10-CM

## 2024-02-05 DIAGNOSIS — D508 Other iron deficiency anemias: Secondary | ICD-10-CM

## 2024-02-05 DIAGNOSIS — E538 Deficiency of other specified B group vitamins: Secondary | ICD-10-CM | POA: Diagnosis not present

## 2024-02-05 DIAGNOSIS — D649 Anemia, unspecified: Secondary | ICD-10-CM | POA: Diagnosis not present

## 2024-02-05 DIAGNOSIS — D0581 Other specified type of carcinoma in situ of right breast: Secondary | ICD-10-CM

## 2024-02-05 LAB — METHYLMALONIC ACID, SERUM: Methylmalonic Acid, Quantitative: 167 nmol/L (ref 0–378)

## 2024-02-05 NOTE — Progress Notes (Signed)
Patient is taking Anastrozole as prescribed.  She has not missed any doses and reports no side effects at this time.   

## 2024-02-12 ENCOUNTER — Inpatient Hospital Stay: Payer: Medicare Other | Admitting: Physician Assistant

## 2024-02-14 ENCOUNTER — Ambulatory Visit (INDEPENDENT_AMBULATORY_CARE_PROVIDER_SITE_OTHER): Payer: Medicare Other

## 2024-02-14 DIAGNOSIS — I442 Atrioventricular block, complete: Secondary | ICD-10-CM

## 2024-02-15 ENCOUNTER — Encounter: Payer: Self-pay | Admitting: Internal Medicine

## 2024-02-15 LAB — CUP PACEART REMOTE DEVICE CHECK
Battery Remaining Longevity: 29 mo
Battery Remaining Percentage: 25 %
Battery Voltage: 2.93 V
Brady Statistic AP VP Percent: 64 %
Brady Statistic AP VS Percent: 1 %
Brady Statistic AS VP Percent: 36 %
Brady Statistic AS VS Percent: 1 %
Brady Statistic RA Percent Paced: 64 %
Brady Statistic RV Percent Paced: 99 %
Date Time Interrogation Session: 20250219020014
Implantable Lead Connection Status: 753985
Implantable Lead Connection Status: 753985
Implantable Lead Implant Date: 20161129
Implantable Lead Implant Date: 20161129
Implantable Lead Location: 753859
Implantable Lead Location: 753860
Implantable Lead Model: 5076
Implantable Pulse Generator Implant Date: 20161129
Lead Channel Impedance Value: 400 Ohm
Lead Channel Impedance Value: 530 Ohm
Lead Channel Pacing Threshold Amplitude: 0.75 V
Lead Channel Pacing Threshold Amplitude: 0.75 V
Lead Channel Pacing Threshold Pulse Width: 0.4 ms
Lead Channel Pacing Threshold Pulse Width: 0.4 ms
Lead Channel Sensing Intrinsic Amplitude: 10 mV
Lead Channel Sensing Intrinsic Amplitude: 3.3 mV
Lead Channel Setting Pacing Amplitude: 1.75 V
Lead Channel Setting Pacing Amplitude: 2 V
Lead Channel Setting Pacing Pulse Width: 0.4 ms
Lead Channel Setting Sensing Sensitivity: 4 mV
Pulse Gen Model: 2240
Pulse Gen Serial Number: 7826037

## 2024-02-29 ENCOUNTER — Inpatient Hospital Stay: Payer: Medicare Other | Attending: Hematology

## 2024-02-29 VITALS — BP 144/52 | HR 55 | Resp 16

## 2024-02-29 DIAGNOSIS — E538 Deficiency of other specified B group vitamins: Secondary | ICD-10-CM | POA: Diagnosis not present

## 2024-02-29 MED ORDER — CYANOCOBALAMIN 1000 MCG/ML IJ SOLN
1000.0000 ug | Freq: Once | INTRAMUSCULAR | Status: AC
Start: 2024-02-29 — End: 2024-02-29
  Administered 2024-02-29: 1000 ug via INTRAMUSCULAR
  Filled 2024-02-29: qty 1

## 2024-02-29 NOTE — Progress Notes (Signed)
 Patient tolerated B!2 injection with no complaints voiced.  Site clean and dry with no bruising or swelling noted at site.  See MAR for details.  Band aid applied.  Patient stable during and after injection.  Vss with discharge and left in satisfactory condition with no s/s of distress noted. All follow ups as scheduled.   Natalie Castro

## 2024-03-20 NOTE — Progress Notes (Signed)
 Remote pacemaker transmission.

## 2024-03-20 NOTE — Addendum Note (Signed)
 Addended by: Elease Etienne A on: 03/20/2024 11:08 AM   Modules accepted: Orders

## 2024-03-27 DIAGNOSIS — E119 Type 2 diabetes mellitus without complications: Secondary | ICD-10-CM | POA: Diagnosis not present

## 2024-03-27 DIAGNOSIS — Z794 Long term (current) use of insulin: Secondary | ICD-10-CM | POA: Diagnosis not present

## 2024-04-01 ENCOUNTER — Inpatient Hospital Stay: Payer: Medicare Other | Attending: Hematology

## 2024-04-01 VITALS — BP 143/59 | HR 60 | Temp 98.2°F | Resp 16

## 2024-04-01 DIAGNOSIS — E538 Deficiency of other specified B group vitamins: Secondary | ICD-10-CM | POA: Insufficient documentation

## 2024-04-01 MED ORDER — CYANOCOBALAMIN 1000 MCG/ML IJ SOLN
1000.0000 ug | Freq: Once | INTRAMUSCULAR | Status: AC
  Administered 2024-04-01: 1000 ug via INTRAMUSCULAR
  Filled 2024-04-01: qty 1

## 2024-04-01 NOTE — Progress Notes (Signed)
B12 injection given per orders. Patient tolerated it well without problems. Vitals stable and discharged home from clinic via wheelchair. Follow up as scheduled.  

## 2024-04-01 NOTE — Patient Instructions (Signed)
 [facility] [place] [facility]  Discharge Instructions: Thank you for choosing [facility] to provide your oncology and hematology care.  If you have a lab appointment with the Cancer Center - please note that after April 8th, 2024, all labs will be drawn in the cancer center.  You do not have to check in or register with the main entrance as you have in the past but will complete your check-in in the cancer center.  Wear comfortable clothing and clothing appropriate for easy access to any Portacath or PICC line.   We strive to give you quality time with your provider. You may need to reschedule your appointment if you arrive late (15 or more minutes).  Arriving late affects you and other patients whose appointments are after yours.  Also, if you miss three or more appointments without notifying the office, you may be dismissed from the clinic at the provider's discretion.      For prescription refill requests, have your pharmacy contact our office and allow 72 hours for refills to be completed.    Today you received the following injection: B12   To help prevent nausea and vomiting after your treatment, we encourage you to take your nausea medication as directed.  BELOW ARE SYMPTOMS THAT SHOULD BE REPORTED IMMEDIATELY: *FEVER GREATER THAN 100.4 F (38 C) OR HIGHER *CHILLS OR SWEATING *NAUSEA AND VOMITING THAT IS NOT CONTROLLED WITH YOUR NAUSEA MEDICATION *UNUSUAL SHORTNESS OF BREATH *UNUSUAL BRUISING OR BLEEDING *URINARY PROBLEMS (pain or burning when urinating, or frequent urination) *BOWEL PROBLEMS (unusual diarrhea, constipation, pain near the anus) TENDERNESS IN MOUTH AND THROAT WITH OR WITHOUT PRESENCE OF ULCERS (sore throat, sores in mouth, or a toothache) UNUSUAL RASH, SWELLING OR PAIN  UNUSUAL VAGINAL DISCHARGE OR ITCHING   Items with * indicate a potential emergency and should be followed up as soon as possible or go to the  Emergency Department if any problems should occur.  Please show the CHEMOTHERAPY ALERT CARD or IMMUNOTHERAPY ALERT CARD at check-in to the Emergency Department and triage nurse.  Should you have questions after your visit or need to cancel or reschedule your appointment, please contact [facility] [place] - A DEPT OF [facility] [phone]  and follow the prompts.  Office hours are 8:00 a.m. to 4:30 p.m. Monday - Friday. Please note that voicemails left after 4:00 p.m. may not be returned until the following business day.  We are closed weekends and major holidays. You have access to a nurse at all times for urgent questions. Please call the main number to the clinic [phone] and follow the prompts.  For any non-urgent questions, you may also contact your provider using MyChart. We now offer e-Visits for anyone 73 and older to request care online for non-urgent symptoms. For details visit [email or web address].   Also download the MyChart app! Go to the app store, search "MyChart", open the app, select [place], and log in with your MyChart username and password.

## 2024-04-02 DIAGNOSIS — E1122 Type 2 diabetes mellitus with diabetic chronic kidney disease: Secondary | ICD-10-CM | POA: Diagnosis not present

## 2024-04-02 DIAGNOSIS — I5030 Unspecified diastolic (congestive) heart failure: Secondary | ICD-10-CM | POA: Diagnosis not present

## 2024-04-02 DIAGNOSIS — Z0001 Encounter for general adult medical examination with abnormal findings: Secondary | ICD-10-CM | POA: Diagnosis not present

## 2024-04-02 DIAGNOSIS — N1831 Chronic kidney disease, stage 3a: Secondary | ICD-10-CM | POA: Diagnosis not present

## 2024-04-02 DIAGNOSIS — I130 Hypertensive heart and chronic kidney disease with heart failure and stage 1 through stage 4 chronic kidney disease, or unspecified chronic kidney disease: Secondary | ICD-10-CM | POA: Diagnosis not present

## 2024-04-02 DIAGNOSIS — Z794 Long term (current) use of insulin: Secondary | ICD-10-CM | POA: Diagnosis not present

## 2024-04-02 DIAGNOSIS — Z86711 Personal history of pulmonary embolism: Secondary | ICD-10-CM | POA: Diagnosis not present

## 2024-04-02 DIAGNOSIS — I10 Essential (primary) hypertension: Secondary | ICD-10-CM | POA: Diagnosis not present

## 2024-04-02 DIAGNOSIS — H9193 Unspecified hearing loss, bilateral: Secondary | ICD-10-CM | POA: Diagnosis not present

## 2024-04-02 DIAGNOSIS — M5412 Radiculopathy, cervical region: Secondary | ICD-10-CM | POA: Diagnosis not present

## 2024-04-02 DIAGNOSIS — E785 Hyperlipidemia, unspecified: Secondary | ICD-10-CM | POA: Diagnosis not present

## 2024-04-02 DIAGNOSIS — Z950 Presence of cardiac pacemaker: Secondary | ICD-10-CM | POA: Diagnosis not present

## 2024-04-03 ENCOUNTER — Encounter: Payer: Self-pay | Admitting: Hematology

## 2024-04-04 DIAGNOSIS — E113293 Type 2 diabetes mellitus with mild nonproliferative diabetic retinopathy without macular edema, bilateral: Secondary | ICD-10-CM | POA: Diagnosis not present

## 2024-04-11 DIAGNOSIS — M542 Cervicalgia: Secondary | ICD-10-CM | POA: Diagnosis not present

## 2024-04-17 ENCOUNTER — Ambulatory Visit: Payer: Medicare HMO | Admitting: Podiatry

## 2024-04-17 ENCOUNTER — Encounter: Payer: Self-pay | Admitting: Podiatry

## 2024-04-17 ENCOUNTER — Ambulatory Visit: Attending: Nurse Practitioner | Admitting: Audiologist

## 2024-04-17 DIAGNOSIS — M79675 Pain in left toe(s): Secondary | ICD-10-CM | POA: Diagnosis not present

## 2024-04-17 DIAGNOSIS — M79674 Pain in right toe(s): Secondary | ICD-10-CM

## 2024-04-17 DIAGNOSIS — B351 Tinea unguium: Secondary | ICD-10-CM

## 2024-04-17 DIAGNOSIS — E119 Type 2 diabetes mellitus without complications: Secondary | ICD-10-CM

## 2024-04-17 DIAGNOSIS — H903 Sensorineural hearing loss, bilateral: Secondary | ICD-10-CM | POA: Diagnosis not present

## 2024-04-17 NOTE — Progress Notes (Signed)
 This patient returns to my office for at risk foot care.  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 This patient is unable to cut nails herself since the patient cannot reach her nails.These nails are painful walking and wearing shoes.  This patient presents for at risk foot care today.  General Appearance  Alert, conversant and in no acute stress.  Vascular  Dorsalis pedis and posterior tibial  pulses are  weakly palpable  bilaterally.  Capillary return is within normal limits  bilaterally. Temperature is within normal limits  bilaterally.  Neurologic  Senn-Weinstein monofilament wire test within normal limits  bilaterally. Muscle power within normal limits bilaterally.  Nails Thick disfigured discolored nails with subungual debris  from hallux to fifth toes bilaterally. No evidence of bacterial infection or drainage bilaterally.  Orthopedic  No limitations of motion  feet .  No crepitus or effusions noted.  No bony pathology or digital deformities noted.  Skin  normotropic skin with no porokeratosis noted bilaterally.  No signs of infections or ulcers noted.     Onychomycosis  Pain in right toes  Pain in left toes  Consent was obtained for treatment procedures.   Mechanical debridement of nails 1-5  bilaterally performed with a nail nipper.  Filed with dremel without incident.    Return office visit    4 months                  Told patient to return for periodic foot care and evaluation due to potential at risk complications.   Helane Gunther DPM

## 2024-04-17 NOTE — Procedures (Signed)
  Outpatient Rehabilitation and [facility] [address] [place], [place] [address] [phone]  AUDIOLOGICAL EVALUATION  Name: Natalie Castro    Status: Outpatient DOB: 20-Feb-1941    Referent: Harriet Limber, FNP MRN: [id number] Date: 04/17/2024     Diagnosis: Sensorineural hearing loss, bilateral   HISTORY: Natalie Castro was seen for an audiological evaluation.   Natalie Castro notices increased difficulty hearing. She struggles to hear in noise or if someone is at a distance.  Natalie Castro reports a history of occupational noise exposure working at [employer] and other jobs. Natalie Castro notes no ear pain,  pressure, fullness, or tinnitus. She notes occasional dizziness when she first stands up or is walking, like she might fall over. She uses a walker. There is some family history of hearing loss, and no history of ear surgery or ear infections.          EVALUATION: Otoscopic inspection reveals a clear left ear canal with visible tympanic membrane and non-occluding cerumen blocking view of the right tympanic membrane.  Tympanometry was completed to assess middle ear status. Normal, Type A  tympanograms were obtained bilaterally.  Standard audiometric techniques were used to obtain thresholds under headphones. Speech reception thresholds are 40 dBHL on the left and 45 dBHL on the right using recorded spondee word lists. Word recognition was 80% at 80 dBHL on the left at and 76% at 85 dBHL on the right using recorded NU-6 word lists, in quiet.  A mild sloping to moderate sensorineural hearing loss is present bilaterally.    CONCLUSION:  Natalie Castro has a mild sloping to moderate high frequency sensorineural hearing loss bilaterally.  Word recognition is good in quiet at an elevated speech level bilaterally. Findings were reviewed with the patient.   RECOMMENDATIONS: Use of binaural amplification to assist in daily listening situations. A list of area hearing aid providers was given to the patient. Use of  communication strategies to improve communication in difficult listening situations.  Audiogram located in media tab. 40 minutes spent testing and counseling.   Burgess Caroline, Au.D., CCC-A Doctor of Audiology 04/17/2024  cc: Harriet Limber, FNP

## 2024-04-28 ENCOUNTER — Emergency Department (HOSPITAL_COMMUNITY)

## 2024-04-28 ENCOUNTER — Other Ambulatory Visit: Payer: Self-pay

## 2024-04-28 ENCOUNTER — Encounter (HOSPITAL_COMMUNITY): Payer: Self-pay | Admitting: Emergency Medicine

## 2024-04-28 ENCOUNTER — Emergency Department (HOSPITAL_COMMUNITY)
Admission: EM | Admit: 2024-04-28 | Discharge: 2024-04-28 | Disposition: A | Discharge status: Home / Self Care | Attending: Emergency Medicine | Admitting: Emergency Medicine

## 2024-04-28 DIAGNOSIS — Z950 Presence of cardiac pacemaker: Secondary | ICD-10-CM | POA: Diagnosis not present

## 2024-04-28 DIAGNOSIS — R000 Tachycardia, unspecified: Secondary | ICD-10-CM | POA: Diagnosis not present

## 2024-04-28 DIAGNOSIS — R55 Syncope and collapse: Secondary | ICD-10-CM | POA: Diagnosis not present

## 2024-04-28 DIAGNOSIS — E860 Dehydration: Secondary | ICD-10-CM | POA: Diagnosis not present

## 2024-04-28 DIAGNOSIS — Z794 Long term (current) use of insulin: Secondary | ICD-10-CM | POA: Diagnosis not present

## 2024-04-28 DIAGNOSIS — I700 Atherosclerosis of aorta: Secondary | ICD-10-CM | POA: Diagnosis not present

## 2024-04-28 DIAGNOSIS — R42 Dizziness and giddiness: Secondary | ICD-10-CM | POA: Diagnosis not present

## 2024-04-28 DIAGNOSIS — I10 Essential (primary) hypertension: Secondary | ICD-10-CM | POA: Diagnosis not present

## 2024-04-28 DIAGNOSIS — Z7901 Long term (current) use of anticoagulants: Secondary | ICD-10-CM | POA: Insufficient documentation

## 2024-04-28 DIAGNOSIS — R739 Hyperglycemia, unspecified: Secondary | ICD-10-CM | POA: Diagnosis not present

## 2024-04-28 LAB — CBC WITH DIFFERENTIAL/PLATELET
Abs Immature Granulocytes: 0.04 10*3/uL (ref 0.00–0.07)
Basophils Absolute: 0 10*3/uL (ref 0.0–0.1)
Basophils Relative: 0 %
Eosinophils Absolute: 0.1 10*3/uL (ref 0.0–0.5)
Eosinophils Relative: 1 %
HCT: 40.5 % (ref 36.0–46.0)
Hemoglobin: 12.5 g/dL (ref 12.0–15.0)
Immature Granulocytes: 1 %
Lymphocytes Relative: 26 %
Lymphs Abs: 2.2 10*3/uL (ref 0.7–4.0)
MCH: 28.8 pg (ref 26.0–34.0)
MCHC: 30.9 g/dL (ref 30.0–36.0)
MCV: 93.3 fL (ref 80.0–100.0)
Monocytes Absolute: 0.8 10*3/uL (ref 0.1–1.0)
Monocytes Relative: 9 %
Neutro Abs: 5.2 10*3/uL (ref 1.7–7.7)
Neutrophils Relative %: 63 %
Platelets: 193 10*3/uL (ref 150–400)
RBC: 4.34 MIL/uL (ref 3.87–5.11)
RDW: 13.3 % (ref 11.5–15.5)
WBC: 8.3 10*3/uL (ref 4.0–10.5)
nRBC: 0 % (ref 0.0–0.2)

## 2024-04-28 LAB — COMPREHENSIVE METABOLIC PANEL WITH GFR
ALT: 10 U/L (ref 0–44)
AST: 18 U/L (ref 15–41)
Albumin: 3.3 g/dL — ABNORMAL LOW (ref 3.5–5.0)
Alkaline Phosphatase: 73 U/L (ref 38–126)
Anion gap: 12 (ref 5–15)
BUN: 23 mg/dL (ref 8–23)
CO2: 24 mmol/L (ref 22–32)
Calcium: 9.1 mg/dL (ref 8.9–10.3)
Chloride: 104 mmol/L (ref 98–111)
Creatinine, Ser: 1.5 mg/dL — ABNORMAL HIGH (ref 0.44–1.00)
GFR, Estimated: 35 mL/min — ABNORMAL LOW (ref >60)
Glucose, Bld: 254 mg/dL — ABNORMAL HIGH (ref 70–99)
Potassium: 4.3 mmol/L (ref 3.5–5.1)
Sodium: 140 mmol/L (ref 135–145)
Total Bilirubin: 0.5 mg/dL (ref 0.0–1.2)
Total Protein: 7.7 g/dL (ref 6.5–8.1)

## 2024-04-28 LAB — TROPONIN I (HIGH SENSITIVITY)
Troponin I (High Sensitivity): 19 ng/L — ABNORMAL HIGH (ref <18)
Troponin I (High Sensitivity): 18 ng/L — ABNORMAL HIGH (ref <18)

## 2024-04-28 LAB — CBG MONITORING, ED: Glucose-Capillary: 264 mg/dL — ABNORMAL HIGH (ref 70–99)

## 2024-04-28 MED ORDER — SODIUM CHLORIDE 0.9 % IV BOLUS
500.0000 mL | Freq: Once | INTRAVENOUS | Status: AC
  Administered 2024-04-28: 500 mL via INTRAVENOUS

## 2024-04-28 NOTE — ED Notes (Signed)
 ED Provider at bedside.

## 2024-04-28 NOTE — Discharge Instructions (Signed)
 Please follow-up closely with your primary care doctor on an outpatient basis for reevaluation.  Return to the emergency department immediately for any new or worsening symptoms.

## 2024-04-28 NOTE — ED Notes (Signed)
 [employer] representative called and states pt's pacemaker is functioning well with no evidence of anything related to pacemaker of LOC. PA notified.

## 2024-04-28 NOTE — ED Triage Notes (Signed)
 Pt c/o feeling hot and dizzy at church and then passed out. Pt woke up vomiting.

## 2024-04-28 NOTE — ED Provider Notes (Signed)
 [place] EMERGENCY DEPARTMENT AT [facility] Provider Note   CSN: [id number] Arrival date & time: 04/28/24  1735     History  Chief Complaint  Patient presents with   Loss of Consciousness    Natalie Castro is a 83 y.o. female.  Patient is an 83 year old female who presents to the emergency department with a chief complaint of a syncopal event while at church.  Patient notes that she began feeling hot and dizzy and then syncopized.  Patient notes that when she woke up she did have an episode of vomiting.  She has no active complaints at this point to include chest pain, shortness of breath, abdominal pain, edema, dizziness, lightheadedness.  She does note that she has had some decreased p.o. intake over the past few days.   Loss of Consciousness      Home Medications Prior to Admission medications   Medication Sig Start Date End Date Taking? Authorizing Provider  anastrozole  (ARIMIDEX ) 1 MG tablet TAKE 1 TABLET(1 MG) BY MOUTH DAILY 06/28/23  Yes Paulett Boros, MD  apixaban  (ELIQUIS ) 5 MG TABS tablet Take 1 tablet (5 mg total) by mouth 2 (two) times daily. 02/17/22  Yes Marilyne Shu, NP  atorvastatin  (LIPITOR) 40 MG tablet Take 1 tablet (40 mg total) by mouth daily. Patient taking differently: Take 40 mg by mouth at bedtime. 02/17/22  Yes Marilyne Shu, NP  ferrous sulfate  325 (65 FE) MG EC tablet Take 1 tablet (325 mg total) by mouth daily with breakfast. 11/08/23  Yes Rosebud Confer, Rebekah M, PA-C  furosemide  (LASIX ) 40 MG tablet Take 1 tablet (40 mg) daily Monday, Wednesday, Friday. Take 2 tablets (80 mg total) all other days. 10/18/23  Yes Tammie Fall, MD  LANTUS  SOLOSTAR 100 UNIT/ML Solostar Pen Inject 10 Units into the skin at bedtime. Patient taking differently: Inject 25 Units into the skin at bedtime. 02/17/22  Yes Marilyne Shu, NP  losartan  (COZAAR ) 100 MG tablet Take 100 mg by mouth daily. 03/14/22  Yes [provider]  metFORMIN   (GLUCOPHAGE ) 1000 MG tablet TAKE (1) TABLET BY MOUTH TWICE DAILY. 02/17/22  Yes Marilyne Shu, NP  metoprolol  succinate (TOPROL -XL) 50 MG 24 hr tablet TAKE 1 TABLET(50 MG) BY MOUTH IN THE MORNING AND AT BEDTIME WITH OR IMMEDIATELY FOLLOWING A MEAL 12/18/23  Yes Tammie Fall, MD  potassium chloride  (KLOR-CON ) 10 MEQ tablet Take 10 mEq by mouth daily. 07/25/22  Yes [provider]  glucose blood test strip Use to check blood glucose fasting , before lunch and dinner and after your largest meal 02/17/22   Marilyne Shu, NP  Insulin  Pen Needle (BD AUTOSHIELD DUO) 30G X 5 MM MISC 30 gauge x 3/16" (pen needle,diabetic dual safty)    [provider]  Lancets (ONETOUCH ULTRASOFT) lancets Use to check glucose 4 x daily 02/17/22   Marilyne Shu, NP      Allergies    Penicillins    Review of Systems   Review of Systems  Cardiovascular:  Positive for syncope.  Neurological:  Positive for syncope.  All other systems reviewed and are negative.   Physical Exam Updated Vital Signs BP 139/86   Pulse 63   Temp 98.6 F (37 C) (Oral)   Resp 15   Ht 5\' 5"  (1.651 m)   Wt 97 kg   SpO2 93%   BMI 35.59 kg/m  Physical Exam Vitals and nursing note reviewed.  Constitutional:  Appearance: Normal appearance.  HENT:     Head: Normocephalic and atraumatic.     Nose: Nose normal.     Mouth/Throat:     Mouth: Mucous membranes are moist.  Eyes:     Extraocular Movements: Extraocular movements intact.     Conjunctiva/sclera: Conjunctivae normal.     Pupils: Pupils are equal, round, and reactive to light.  Cardiovascular:     Rate and Rhythm: Normal rate and regular rhythm.     Pulses: Normal pulses.     Heart sounds: Normal heart sounds. No murmur heard.    No gallop.  Pulmonary:     Effort: Pulmonary effort is normal. No respiratory distress.     Breath sounds: Normal breath sounds. No stridor. No wheezing, rhonchi or rales.  Abdominal:     General: Abdomen is flat.  Bowel sounds are normal. There is no distension.     Palpations: Abdomen is soft.     Tenderness: There is no abdominal tenderness. There is no guarding.  Musculoskeletal:        General: No swelling, tenderness, deformity or signs of injury. Normal range of motion.     Cervical back: Normal range of motion and neck supple.  Skin:    General: Skin is warm and dry.     Findings: No bruising or rash.  Neurological:     General: No focal deficit present.     Mental Status: She is alert and oriented to person, place, and time. Mental status is at baseline.     Cranial Nerves: No cranial nerve deficit.     Sensory: No sensory deficit.     Motor: No weakness.     Coordination: Coordination normal.     Gait: Gait normal.  Psychiatric:        Mood and Affect: Mood normal.        Behavior: Behavior normal.        Thought Content: Thought content normal.        Judgment: Judgment normal.     ED Results / Procedures / Treatments   Labs (all labs ordered are listed, but only abnormal results are displayed) Labs Reviewed  COMPREHENSIVE METABOLIC PANEL WITH GFR - Abnormal; Notable for the following components:      Result Value   Glucose, Bld 254 (*)    Creatinine, Ser 1.50 (*)    Albumin 3.3 (*)    GFR, Estimated 35 (*)    All other components within normal limits  CBG MONITORING, ED - Abnormal; Notable for the following components:   Glucose-Capillary 264 (*)    All other components within normal limits  TROPONIN I (HIGH SENSITIVITY) - Abnormal; Notable for the following components:   Troponin I (High Sensitivity) 19 (*)    All other components within normal limits  CBC WITH DIFFERENTIAL/PLATELET  URINALYSIS, ROUTINE W REFLEX MICROSCOPIC  TROPONIN I (HIGH SENSITIVITY)    EKG EKG Interpretation Date/Time:  Sunday Apr 28 2024 17:53:44 EDT Ventricular Rate:  59 PR Interval:  226 QRS Duration:  164 QT Interval:  490 QTC Calculation: 486 R Axis:   -81  Text Interpretation: A-V  dual-paced rhythm Confirmed by Guadalupe Lee ([id number]) on 04/28/2024 5:56:30 PM  Radiology CT Head Wo Contrast Result Date: 04/28/2024 CLINICAL DATA:  Syncopal episode EXAM: CT HEAD WITHOUT CONTRAST TECHNIQUE: Contiguous axial images were obtained from the base of the skull through the vertex without intravenous contrast. RADIATION DOSE REDUCTION: This exam was performed according to the departmental dose-optimization program  which includes automated exposure control, adjustment of the mA and/or kV according to patient size and/or use of iterative reconstruction technique. COMPARISON:  None Available. FINDINGS: Brain: No evidence of acute infarction, hemorrhage, hydrocephalus, extra-axial collection or mass lesion/mass effect. Mild chronic white matter ischemic changes are noted. Vascular: No hyperdense vessel or unexpected calcification. Skull: Normal. Negative for fracture or focal lesion. Sinuses/Orbits: No acute finding. Other: None. IMPRESSION: Chronic white matter ischemic changes without acute abnormality. Electronically Signed   By: Violeta Grey M.D.   On: 04/28/2024 19:25   DG Chest Port 1 View Result Date: 04/28/2024 CLINICAL DATA:  Syncope. EXAM: PORTABLE CHEST 1 VIEW COMPARISON:  Chest radiograph dated 10/03/2023. FINDINGS: Shallow inspiration. No focal consolidation, pleural effusion or pneumothorax. The cardiac silhouette is within limits. Atherosclerotic calcification of the aorta. Left pectoral pacemaker device. No acute osseous pathology. IMPRESSION: No active disease. Electronically Signed   By: Angus Bark M.D.   On: 04/28/2024 18:29    Procedures Procedures    Medications Ordered in ED Medications  sodium chloride  0.9 % bolus 500 mL (500 mLs Intravenous Bolus 04/28/24 1858)    ED Course/ Medical Decision Making/ A&P                                 Medical Decision Making Amount and/or Complexity of Data Reviewed Labs: ordered. Radiology: ordered.   This patient presents  to the ED for concern of syncope differential diagnosis includes CVA, TIA, dehydration, electrolyte derangement, acute kidney injury, pneumonia, intracranial hemorrhage, vasovagal syncope    Additional history obtained:  Additional history obtained from family External records from outside source obtained and reviewed including medical records   Lab Tests:  I Ordered, and personally interpreted labs.  The pertinent results include: No leukocytosis, no anemia, normal electrolytes, elevated creatinine, normal liver function, elevated troponin but flat   Imaging Studies ordered:  I ordered imaging studies including CT scan of head, chest x-ray I independently visualized and interpreted imaging which showed no acute intracranial process, no acute cardiopulmonary process I agree with the radiologist interpretation   Medicines ordered and prescription drug management:  I ordered medication including IV fluids for dehydration Reevaluation of the patient after these medicines showed that the patient improved I have reviewed the patients home medicines and have made adjustments as needed   Problem List / ED Course:  Patient is feeling much better at this time and is stable for discharge home.  Discussed with patient and family that all workup in the emergency department has been unremarkable.  Pacemaker was interrogated with no abnormal rhythms noted.  CT scan of the head demonstrated no acute changes and chest x-ray was unremarkable as well.  Patient was given IV fluids and was encouraged to increase her p.o. intake at home.  Patient was directed to follow-up closely with her primary care doctor on outpatient basis for reevaluation.  Strict return precautions were discussed for any new or worsening symptoms.  Patient voiced understanding of the plan and had no additional questions.   Social Determinants of Health:  None           Final Clinical Impression(s) / ED  Diagnoses Final diagnoses:  None    Rx / DC Orders ED Discharge Orders     None         Joandy Burget D, PA-C 04/28/24 2100    Guadalupe Lee, MD 04/29/24 1630

## 2024-05-01 ENCOUNTER — Inpatient Hospital Stay: Payer: Medicare Other

## 2024-05-01 ENCOUNTER — Inpatient Hospital Stay: Payer: Medicare Other | Attending: Hematology

## 2024-05-01 VITALS — BP 127/56 | HR 58 | Temp 99.0°F | Resp 20

## 2024-05-01 DIAGNOSIS — Z79811 Long term (current) use of aromatase inhibitors: Secondary | ICD-10-CM | POA: Insufficient documentation

## 2024-05-01 DIAGNOSIS — D0581 Other specified type of carcinoma in situ of right breast: Secondary | ICD-10-CM

## 2024-05-01 DIAGNOSIS — D0511 Intraductal carcinoma in situ of right breast: Secondary | ICD-10-CM | POA: Insufficient documentation

## 2024-05-01 DIAGNOSIS — E538 Deficiency of other specified B group vitamins: Secondary | ICD-10-CM | POA: Diagnosis not present

## 2024-05-01 DIAGNOSIS — Z170 Estrogen receptor positive status [ER+]: Secondary | ICD-10-CM | POA: Diagnosis not present

## 2024-05-01 DIAGNOSIS — D649 Anemia, unspecified: Secondary | ICD-10-CM

## 2024-05-01 DIAGNOSIS — D509 Iron deficiency anemia, unspecified: Secondary | ICD-10-CM | POA: Diagnosis not present

## 2024-05-01 DIAGNOSIS — D508 Other iron deficiency anemias: Secondary | ICD-10-CM

## 2024-05-01 LAB — COMPREHENSIVE METABOLIC PANEL WITH GFR
ALT: 9 U/L (ref 0–44)
AST: 16 U/L (ref 15–41)
Albumin: 3 g/dL — ABNORMAL LOW (ref 3.5–5.0)
Alkaline Phosphatase: 67 U/L (ref 38–126)
Anion gap: 11 (ref 5–15)
BUN: 16 mg/dL (ref 8–23)
CO2: 26 mmol/L (ref 22–32)
Calcium: 8.7 mg/dL — ABNORMAL LOW (ref 8.9–10.3)
Chloride: 102 mmol/L (ref 98–111)
Creatinine, Ser: 1.03 mg/dL — ABNORMAL HIGH (ref 0.44–1.00)
GFR, Estimated: 54 mL/min — ABNORMAL LOW (ref >60)
Glucose, Bld: 183 mg/dL — ABNORMAL HIGH (ref 70–99)
Potassium: 3.7 mmol/L (ref 3.5–5.1)
Sodium: 139 mmol/L (ref 135–145)
Total Bilirubin: 0.7 mg/dL (ref 0.0–1.2)
Total Protein: 6.9 g/dL (ref 6.5–8.1)

## 2024-05-01 LAB — CBC WITH DIFFERENTIAL/PLATELET
Abs Immature Granulocytes: 0.02 10*3/uL (ref 0.00–0.07)
Basophils Absolute: 0 10*3/uL (ref 0.0–0.1)
Basophils Relative: 0 %
Eosinophils Absolute: 0.1 10*3/uL (ref 0.0–0.5)
Eosinophils Relative: 2 %
HCT: 35.6 % — ABNORMAL LOW (ref 36.0–46.0)
Hemoglobin: 11.5 g/dL — ABNORMAL LOW (ref 12.0–15.0)
Immature Granulocytes: 0 %
Lymphocytes Relative: 33 %
Lymphs Abs: 2.1 10*3/uL (ref 0.7–4.0)
MCH: 29.6 pg (ref 26.0–34.0)
MCHC: 32.3 g/dL (ref 30.0–36.0)
MCV: 91.8 fL (ref 80.0–100.0)
Monocytes Absolute: 0.6 10*3/uL (ref 0.1–1.0)
Monocytes Relative: 9 %
Neutro Abs: 3.5 10*3/uL (ref 1.7–7.7)
Neutrophils Relative %: 56 %
Platelets: 209 10*3/uL (ref 150–400)
RBC: 3.88 MIL/uL (ref 3.87–5.11)
RDW: 13.3 % (ref 11.5–15.5)
WBC: 6.3 10*3/uL (ref 4.0–10.5)
nRBC: 0 % (ref 0.0–0.2)

## 2024-05-01 LAB — VITAMIN B12: Vitamin B-12: 316 pg/mL (ref 180–914)

## 2024-05-01 LAB — FERRITIN: Ferritin: 58 ng/mL (ref 11–307)

## 2024-05-01 LAB — IRON AND TIBC
Iron: 51 ug/dL (ref 28–170)
Saturation Ratios: 22 % (ref 10.4–31.8)
TIBC: 230 ug/dL — ABNORMAL LOW (ref 250–450)
UIBC: 179 ug/dL

## 2024-05-01 LAB — VITAMIN D 25 HYDROXY (VIT D DEFICIENCY, FRACTURES): Vit D, 25-Hydroxy: 66.21 ng/mL (ref 30–100)

## 2024-05-01 MED ORDER — CYANOCOBALAMIN 1000 MCG/ML IJ SOLN
1000.0000 ug | Freq: Once | INTRAMUSCULAR | Status: AC
  Administered 2024-05-01: 1000 ug via INTRAMUSCULAR
  Filled 2024-05-01: qty 1

## 2024-05-01 NOTE — Patient Instructions (Signed)
 [facility] [place] [facility]  Discharge Instructions: Thank you for choosing [facility] to provide your oncology and hematology care.  If you have a lab appointment with the Cancer Center - please note that after April 8th, 2024, all labs will be drawn in the cancer center.  You do not have to check in or register with the main entrance as you have in the past but will complete your check-in in the cancer center.  Wear comfortable clothing and clothing appropriate for easy access to any Portacath or PICC line.   We strive to give you quality time with your provider. You may need to reschedule your appointment if you arrive late (15 or more minutes).  Arriving late affects you and other patients whose appointments are after yours.  Also, if you miss three or more appointments without notifying the office, you may be dismissed from the clinic at the provider's discretion.      For prescription refill requests, have your pharmacy contact our office and allow 72 hours for refills to be completed.    Today you received the following B12 injection, return as scheduled.   To help prevent nausea and vomiting after your treatment, we encourage you to take your nausea medication as directed.  BELOW ARE SYMPTOMS THAT SHOULD BE REPORTED IMMEDIATELY: *FEVER GREATER THAN 100.4 F (38 C) OR HIGHER *CHILLS OR SWEATING *NAUSEA AND VOMITING THAT IS NOT CONTROLLED WITH YOUR NAUSEA MEDICATION *UNUSUAL SHORTNESS OF BREATH *UNUSUAL BRUISING OR BLEEDING *URINARY PROBLEMS (pain or burning when urinating, or frequent urination) *BOWEL PROBLEMS (unusual diarrhea, constipation, pain near the anus) TENDERNESS IN MOUTH AND THROAT WITH OR WITHOUT PRESENCE OF ULCERS (sore throat, sores in mouth, or a toothache) UNUSUAL RASH, SWELLING OR PAIN  UNUSUAL VAGINAL DISCHARGE OR ITCHING   Items with * indicate a potential emergency and should be followed up as soon as  possible or go to the Emergency Department if any problems should occur.  Please show the CHEMOTHERAPY ALERT CARD or IMMUNOTHERAPY ALERT CARD at check-in to the Emergency Department and triage nurse.  Should you have questions after your visit or need to cancel or reschedule your appointment, please contact [facility] [place] - A DEPT OF Eligha Bridegroom [facility] [phone]  and follow the prompts.  Office hours are 8:00 a.m. to 4:30 p.m. Monday - Friday. Please note that voicemails left after 4:00 p.m. may not be returned until the following business day.  We are closed weekends and major holidays. You have access to a nurse at all times for urgent questions. Please call the main number to the clinic [phone] and follow the prompts.  For any non-urgent questions, you may also contact your provider using MyChart. We now offer e-Visits for anyone 69 and older to request care online for non-urgent symptoms. For details visit [email or web address].   Also download the MyChart app! Go to the app store, search "MyChart", open the app, select [place], and log in with your MyChart username and password.

## 2024-05-01 NOTE — Progress Notes (Signed)
Patient tolerated B12 injection with no complaints voiced. Site clean and dry with no bruising or swelling noted at site. See MAR for details. Band aid applied.  Patient stable during and after injection. VSS with discharge and left in satisfactory condition with no s/s of distress noted. 

## 2024-05-06 LAB — METHYLMALONIC ACID, SERUM: Methylmalonic Acid, Quantitative: 148 nmol/L (ref 0–378)

## 2024-05-06 NOTE — Progress Notes (Deleted)
 Natalie Castro 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, a 83 y.o. female, returns for routine follow-up of her ***. Natalie Castro was last seen on {XX/XX/XXXX}.   At today's visit, she  reports feeling ***.  She denies any recent hospitalizations, surgeries, or changes in her  baseline health status.  Most recent mammogram on *** showed ***.  She denies any symptoms of recurrence such as new lumps, bone pain, chest pain, dyspnea, or abdominal pain.  She has no new headaches, seizures, or focal neurologic deficits.  No B symptoms such as fever, chills, night sweats, unintentional weight loss.  She continues to take *** ARIMIDEX :  Hot flashes, mood swings, weight gain, alopecia, and musculoskeletal pain *** TAMOXIFEN: Hot flashes, night sweats, abnormal vaginal bleeding/vaginal discharge, DVT/PE  She reports ***% energy and ***% appetite.  She is maintaining stable weight at  this time.   ASSESSMENT & PLAN:  1.  *** - *** - PLAN: ***   2.  *** - *** - PLAN: ***   3.  *** - *** - PLAN: ***   4.  *** - *** - PLAN: ***   PLAN SUMMARY: >> *** >> *** >> ***   REVIEW OF SYSTEMS: ***  Review of Systems - Oncology  PHYSICAL EXAM:   Performance status (ECOG): {CHL ONC MV:[id number]} *** There were no vitals filed for this visit. 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 Nunzio Castro 11/24/15   Dyspnea    Essential hypertension    History of pulmonary embolism 2017   unprovoked, long term anticoag with apixaban     Hyperlipidemia    Myoview  03/2021    Myoview  4/22: EF 50, no infarct or ischemia; low risk   Presence of permanent cardiac pacemaker    Type 2 diabetes mellitus (HCC)    Past Surgical History:  Procedure Laterality Date   BREAST BIOPSY Right 05/03/2022   DUCT ECTASIA WITH FOCAL PAPILLARY  EPITHELIAL HYPERPLASIA WITHOUT CYTOLOGIC ATYPIA-NEGATIVE FOR  MALIGNANCY   BREAST LUMPECTOMY Right 06/22/2022   CHOLECYSTECTOMY N/A 03/16/2022   Procedure: LAPAROSCOPIC CHOLECYSTECTOMY;  Surgeon: Natalie Bogus, MD;  Location: [facility] ORS;  Service: General;  Laterality: N/A;   COLONOSCOPY     3 SIMPLE ADENOMAS, AGE 89   COLONOSCOPY N/A 05/24/2018   Procedure: COLONOSCOPY;  Surgeon: Natalie Jubilee, MD;  Location: [facility] ENDO SUITE;  Service: Endoscopy;  Laterality: N/A;  1:00pm   ENDOSCOPIC RETROGRADE CHOLANGIOPANCREATOGRAPHY (ERCP) WITH PROPOFOL  N/A 03/15/2022   Procedure: ENDOSCOPIC RETROGRADE CHOLANGIOPANCREATOGRAPHY (ERCP) WITH PROPOFOL ;  Surgeon: Natalie Corporal, MD;  Location: [facility];   Service: Endoscopy;  Laterality: N/A;  with sphincterotomy and stone retrieval   EP IMPLANTABLE DEVICE N/A 11/24/2015   Procedure: Pacemaker Implant;  Surgeon: Natalie Needle, MD;  Location: [facility] INVASIVE CV LAB;  Service: Cardiovascular;  Laterality: N/A;   IR EXCHANGE BILIARY DRAIN  07/27/2021   IR EXCHANGE BILIARY DRAIN  08/23/2021   IR EXCHANGE BILIARY DRAIN  02/25/2022   IR PERC CHOLECYSTOSTOMY  07/08/2021   IR PERC CHOLECYSTOSTOMY  11/04/2021   IR RADIOLOGIST EVAL & MGMT  09/22/2021   JOINT REPLACEMENT     knees bilat.   LEFT HEART CATH AND CORONARY ANGIOGRAPHY N/A 07/02/2021   Procedure: LEFT HEART CATH AND CORONARY ANGIOGRAPHY;  Surgeon: Natalie Rusk, MD;  Location: [facility];  Service: Cardiovascular;  Laterality: N/A;   POLYPECTOMY  05/24/2018   Procedure: POLYPECTOMY;  Surgeon: Natalie Jubilee, MD;  Location: [facility];  Service: Endoscopy;;  ascending and hepatic flexure, transverse   REMOVAL OF STONES N/A 03/15/2022   Procedure: STONE AND SLUDGE EXTRACTION;  Surgeon: Natalie Corporal, MD;  Location: [facility];  Service: Endoscopy;  Laterality: N/A;   TEE WITHOUT CARDIOVERSION N/A 08/27/2021   Procedure: TRANSESOPHAGEAL ECHOCARDIOGRAM (TEE);  Surgeon: Natalie Lites, MD;  Location: [facility] ENDOSCOPY;  Service: Cardiovascular;  Laterality: N/A;   TUBAL LIGATION      SOCIAL HISTORY:  Social History   Socioeconomic History   Marital status: Widowed    Spouse name: Not on file   Number of children: Not on file   Years of education: Not on file   Highest education level: Not on file  Occupational History   Occupation: retired  Tobacco Use   Smoking status: Never   Smokeless tobacco: Never  Vaping Use   Vaping status: Never Used  Substance and Sexual Activity   Alcohol use: No    Alcohol/week: 0.0 standard drinks of alcohol   Drug use: No   Sexual activity: Yes    Birth control/protection: Post-menopausal  Other Topics Concern   Not on file  Social  History Narrative   MARRIED FOR 38 YRS. 5 KIDS: #4 PRESENT TODAY(AGE 57)   Social Drivers of Corporate investment banker Strain: Not on file  Food Insecurity: Not on file  Transportation Needs: Not on file  Physical Activity: Not on file  Stress: Not on file  Social Connections: Not on file  Intimate Partner Violence: Not on file    FAMILY HISTORY:  Family History  Problem Relation Age of Onset   Heart failure Mother    Hypertension Mother    Heart failure Sister    Hypertension Father    Cancer Sister    Dementia Sister    Colon cancer Neg Hx    Colon polyps Neg Hx     CURRENT MEDICATIONS:  Current Outpatient Medications  Medication Sig Dispense Refill   anastrozole  (ARIMIDEX ) 1  MG tablet TAKE 1 TABLET(1 MG) BY MOUTH DAILY 90 tablet 3   apixaban  (ELIQUIS ) 5 MG TABS tablet Take 1 tablet (5 mg total) by mouth 2 (two) times daily. 60 tablet 0   atorvastatin  (LIPITOR) 40 MG tablet Take 1 tablet (40 mg total) by mouth daily. (Patient taking differently: Take 40 mg by mouth at bedtime.) 30 tablet 0   ferrous sulfate  325 (65 FE) MG EC tablet Take 1 tablet (325 mg total) by mouth daily with breakfast. 90 tablet 3   furosemide  (LASIX ) 40 MG tablet Take 1 tablet (40 mg) daily Monday, Wednesday, Friday. Take 2 tablets (80 mg total) all other days. 140 tablet 3   glucose blood test strip Use to check blood glucose fasting , before lunch and dinner and after your largest meal 120 each 0   Insulin  Pen Castro (BD AUTOSHIELD DUO) 30G X 5 MM MISC 30 gauge x 3/16" (pen Castro,diabetic dual safty)     Lancets (ONETOUCH ULTRASOFT) lancets Use to check glucose 4 x daily 120 each 0   LANTUS  SOLOSTAR 100 UNIT/ML Solostar Pen Inject 10 Units into the skin at bedtime. (Patient taking differently: Inject 25 Units into the skin at bedtime.) 15 mL 0   losartan  (COZAAR ) 100 MG tablet Take 100 mg by mouth daily.     metFORMIN  (GLUCOPHAGE ) 1000 MG tablet TAKE (1) TABLET BY MOUTH TWICE DAILY. 60 tablet 0    metoprolol  succinate (TOPROL -XL) 50 MG 24 hr tablet TAKE 1 TABLET(50 MG) BY MOUTH IN THE MORNING AND AT BEDTIME WITH OR IMMEDIATELY FOLLOWING A MEAL 180 tablet 2   potassium chloride  (KLOR-CON ) 10 MEQ tablet Take 10 mEq by mouth daily.     No current facility-administered medications for this visit.    ALLERGIES:  Allergies  Allergen Reactions   Penicillins Hives, Itching and Other (See Comments)    Has tolerated ceftriaxone  7/22 Severe rash involving mucus membranes or skin necrosis    LABORATORY DATA:  I have reviewed the labs as listed.     Latest Ref Rng & Units 05/01/2024    2:46 PM 04/28/2024    6:09 PM 02/01/2024    1:52 PM  CBC  WBC 4.0 - 10.5 K/uL 6.3  8.3  7.1   Hemoglobin 12.0 - 15.0 g/dL 09.8  11.9  14.7   Hematocrit 36.0 - 46.0 % 35.6  40.5  37.2   Platelets 150 - 400 K/uL 209  193  195       Latest Ref Rng & Units 05/01/2024    2:46 PM 04/28/2024    6:09 PM 11/01/2023    1:15 PM  CMP  Glucose 70 - 99 mg/dL 829  562  130   BUN 8 - 23 mg/dL 16  23  21    Creatinine 0.44 - 1.00 mg/dL 8.65  7.84  6.96   Sodium 135 - 145 mmol/L 139  140  139   Potassium 3.5 - 5.1 mmol/L 3.7  4.3  3.7   Chloride 98 - 111 mmol/L 102  104  103   CO2 22 - 32 mmol/L 26  24  26    Calcium  8.9 - 10.3 mg/dL 8.7  9.1  8.8   Total Protein 6.5 - 8.1 g/dL 6.9  7.7  7.2   Total Bilirubin 0.0 - 1.2 mg/dL 0.7  0.5  0.5   Alkaline Phos 38 - 126 U/L 67  73  73   AST 15 - 41 U/L 16  18  13  ALT 0 - 44 U/L 9  10  10      DIAGNOSTIC IMAGING:  I have independently reviewed the scans and discussed with the patient. CT Head Wo Contrast Result Date: 04/28/2024 CLINICAL DATA:  Syncopal episode EXAM: CT HEAD WITHOUT CONTRAST TECHNIQUE: Contiguous axial images were obtained from the base of the skull through the vertex without intravenous contrast. RADIATION DOSE REDUCTION: This exam was performed according to the departmental dose-optimization program which includes automated exposure control, adjustment of  the mA and/or kV according to patient size and/or use of iterative reconstruction technique. COMPARISON:  None Available. FINDINGS: Brain: No evidence of acute infarction, hemorrhage, hydrocephalus, extra-axial collection or mass lesion/mass effect. Mild chronic white matter ischemic changes are noted. Vascular: No hyperdense vessel or unexpected calcification. Skull: Normal. Negative for fracture or focal lesion. Sinuses/Orbits: No acute finding. Other: None. IMPRESSION: Chronic white matter ischemic changes without acute abnormality. Electronically Signed   By: Violeta Grey M.D.   On: 04/28/2024 19:25   DG Chest Port 1 View Result Date: 04/28/2024 CLINICAL DATA:  Syncope. EXAM: PORTABLE CHEST 1 VIEW COMPARISON:  Chest radiograph dated 10/03/2023. FINDINGS: Shallow inspiration. No focal consolidation, pleural effusion or pneumothorax. The cardiac silhouette is within limits. Atherosclerotic calcification of the aorta. Left pectoral pacemaker device. No acute osseous pathology. IMPRESSION: No active disease. Electronically Signed   By: Angus Bark M.D.   On: 04/28/2024 18:29     WRAP UP:  All questions were answered. The patient knows to call the clinic with any problems, questions or concerns.  Medical decision making: ***  Time spent on visit: I spent {CHL ONC TIME VISIT - VWUJW:[id number]} counseling the patient face to face. The total time spent in the appointment was {CHL ONC TIME VISIT - AOZHY:[id number]} and more than 50% was on counseling.  Sonnie Dusky, PA-C  ***

## 2024-05-07 ENCOUNTER — Inpatient Hospital Stay: Payer: Medicare Other | Admitting: Physician Assistant

## 2024-05-08 DIAGNOSIS — K529 Noninfective gastroenteritis and colitis, unspecified: Secondary | ICD-10-CM | POA: Diagnosis not present

## 2024-05-08 DIAGNOSIS — Z794 Long term (current) use of insulin: Secondary | ICD-10-CM | POA: Diagnosis not present

## 2024-05-08 DIAGNOSIS — I442 Atrioventricular block, complete: Secondary | ICD-10-CM | POA: Diagnosis not present

## 2024-05-08 DIAGNOSIS — Z87898 Personal history of other specified conditions: Secondary | ICD-10-CM | POA: Diagnosis not present

## 2024-05-08 DIAGNOSIS — Z7689 Persons encountering health services in other specified circumstances: Secondary | ICD-10-CM | POA: Diagnosis not present

## 2024-05-08 DIAGNOSIS — Z950 Presence of cardiac pacemaker: Secondary | ICD-10-CM | POA: Diagnosis not present

## 2024-05-08 DIAGNOSIS — E7800 Pure hypercholesterolemia, unspecified: Secondary | ICD-10-CM | POA: Diagnosis not present

## 2024-05-08 DIAGNOSIS — E860 Dehydration: Secondary | ICD-10-CM | POA: Diagnosis not present

## 2024-05-08 DIAGNOSIS — E119 Type 2 diabetes mellitus without complications: Secondary | ICD-10-CM | POA: Diagnosis not present

## 2024-05-15 ENCOUNTER — Ambulatory Visit (INDEPENDENT_AMBULATORY_CARE_PROVIDER_SITE_OTHER): Payer: Medicare Other

## 2024-05-15 ENCOUNTER — Ambulatory Visit: Payer: Self-pay | Admitting: Internal Medicine

## 2024-05-15 DIAGNOSIS — I442 Atrioventricular block, complete: Secondary | ICD-10-CM | POA: Diagnosis not present

## 2024-05-15 LAB — CUP PACEART REMOTE DEVICE CHECK
Battery Remaining Longevity: 25 mo
Battery Remaining Percentage: 23 %
Battery Voltage: 2.93 V
Brady Statistic AP VP Percent: 60 %
Brady Statistic AP VS Percent: 1 %
Brady Statistic AS VP Percent: 39 %
Brady Statistic AS VS Percent: 1 %
Brady Statistic RA Percent Paced: 60 %
Brady Statistic RV Percent Paced: 99 %
Date Time Interrogation Session: 20250521020014
Implantable Lead Connection Status: 753985
Implantable Lead Connection Status: 753985
Implantable Lead Implant Date: 20161129
Implantable Lead Implant Date: 20161129
Implantable Lead Location: 753859
Implantable Lead Location: 753860
Implantable Lead Model: 5076
Implantable Pulse Generator Implant Date: 20161129
Lead Channel Impedance Value: 360 Ohm
Lead Channel Impedance Value: 480 Ohm
Lead Channel Pacing Threshold Amplitude: 0.75 V
Lead Channel Pacing Threshold Amplitude: 0.875 V
Lead Channel Pacing Threshold Pulse Width: 0.4 ms
Lead Channel Pacing Threshold Pulse Width: 0.4 ms
Lead Channel Sensing Intrinsic Amplitude: 10 mV
Lead Channel Sensing Intrinsic Amplitude: 3.5 mV
Lead Channel Setting Pacing Amplitude: 1.875
Lead Channel Setting Pacing Amplitude: 2 V
Lead Channel Setting Pacing Pulse Width: 0.4 ms
Lead Channel Setting Sensing Sensitivity: 4 mV
Pulse Gen Model: 2240
Pulse Gen Serial Number: 7826037

## 2024-05-16 ENCOUNTER — Encounter (INDEPENDENT_AMBULATORY_CARE_PROVIDER_SITE_OTHER): Payer: Self-pay | Admitting: *Deleted

## 2024-05-25 ENCOUNTER — Encounter: Payer: Self-pay | Admitting: Emergency Medicine

## 2024-05-25 ENCOUNTER — Ambulatory Visit (INDEPENDENT_AMBULATORY_CARE_PROVIDER_SITE_OTHER)

## 2024-05-25 ENCOUNTER — Other Ambulatory Visit: Payer: Self-pay

## 2024-05-25 ENCOUNTER — Ambulatory Visit
Admission: EM | Admit: 2024-05-25 | Discharge: 2024-05-25 | Disposition: A | Discharge status: Home / Self Care | Attending: Family Medicine | Admitting: Family Medicine

## 2024-05-25 DIAGNOSIS — R051 Acute cough: Secondary | ICD-10-CM | POA: Diagnosis not present

## 2024-05-25 DIAGNOSIS — I10 Essential (primary) hypertension: Secondary | ICD-10-CM

## 2024-05-25 DIAGNOSIS — I700 Atherosclerosis of aorta: Secondary | ICD-10-CM | POA: Diagnosis not present

## 2024-05-25 DIAGNOSIS — R058 Other specified cough: Secondary | ICD-10-CM | POA: Diagnosis not present

## 2024-05-25 DIAGNOSIS — Z950 Presence of cardiac pacemaker: Secondary | ICD-10-CM | POA: Diagnosis not present

## 2024-05-25 MED ORDER — BENZONATATE 100 MG PO CAPS: ORAL_CAPSULE | ORAL | 0 refills | Status: DC

## 2024-05-25 NOTE — ED Provider Notes (Signed)
 [facility]   [id number] 05/25/24 Arrival Time: 0920  ASSESSMENT & PLAN:  1. Acute cough   2. Elevated blood pressure reading with diagnosis of hypertension    I have personally viewed and independently interpreted the imaging studies ordered this visit. CXR: stable cardiomegaly; no acute changes.  Meds ordered this encounter  Medications   benzonatate (TESSALON) 100 MG capsule    Sig: Take 1 capsule by mouth every 8 (eight) hours for cough.    Dispense:  21 capsule    Refill:  0   OTC as needed.   Follow-up Information     Omie Bickers, MD.   Specialty: Internal Medicine Why: For follow up and to recheck your blood pressure. Contact information: [address] [place] [facility] [address] [phone]                  Discharge Instructions      Your blood pressure was noted to be elevated during your visit today. If you are currently taking medication for high blood pressure, please ensure you are taking this as directed. If you do not have a history of high blood pressure and your blood pressure remains persistently elevated, you may need to begin taking a medication at some point. You may return here within the next few days to recheck if unable to see your primary care provider or if you do not have a one.  BP (!) 162/67 (BP Location: Right Arm)   Pulse 70   Temp 99.5 F (37.5 C) (Oral)   Resp 20   SpO2 94%   BP Readings from Last 3 Encounters:  05/25/24 (!) 162/67  05/01/24 (!) 127/56  04/28/24 (!) 125/95       Reviewed expectations re: course of current medical issues. Questions answered. Outlined signs and symptoms indicating need for more acute intervention. Understanding verbalized. After Visit Summary given.   SUBJECTIVE: History from: Patient. Natalie Castro is a 83 y.o. female. Pt reports productive cough for last two weeks. Has been taking "diabetic tussin" with minimal change in symptoms. Denies any known fevers. Pt reports  was recently treated for UTI and finished abx on Wednesday. Denies: fever. Normal PO intake without n/v/d.  Increased blood pressure noted today. Reports that she is treated for HTN. Taking medications as directed.  OBJECTIVE:  Vitals:   05/25/24 0926  BP: (!) 162/67  Pulse: 70  Resp: 20  Temp: 99.5 F (37.5 C)  TempSrc: Oral  SpO2: 94%    General appearance: alert; no distress Eyes: PERRLA; EOMI; conjunctiva normal HENT: [place]; AT; with nasal congestion Neck: supple  Lungs: speaks full sentences without difficulty; unlabored; CTAB Extremities: no edema Skin: warm and dry Neurologic: normal gait Psychological: alert and cooperative; normal mood and affect   Imaging: DG Chest 2 View Result Date: 05/25/2024 CLINICAL DATA:  productive cough x 2 weeks EXAM: CHEST - 2 VIEW COMPARISON:  04/28/2024. FINDINGS: Cardiac silhouette enlarged. No evidence of pneumothorax or pleural effusion. No evidence of pulmonary edema. Aorta is calcified. There are thoracic degenerative changes. There is a left-sided pacer. IMPRESSION: Enlarged cardiac silhouette. Electronically Signed   By: Sydell Eva M.D.   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 Nunzio Belch 11/24/15   Dyspnea    Essential hypertension    History of pulmonary embolism 2017   unprovoked, long term anticoag with apixaban     Hyperlipidemia    Myoview  03/2021    Myoview  4/22: EF 50, no infarct or ischemia; low risk   Presence of permanent cardiac pacemaker    Type 2 diabetes mellitus (HCC)    Social History   Socioeconomic History   Marital status:  Widowed    Spouse name: Not on file   Number of children: Not on file   Years of education: Not on file   Highest education level: Not on file  Occupational History   Occupation: retired  Tobacco Use   Smoking status: Never   Smokeless tobacco: Never  Vaping Use   Vaping status: Never Used  Substance and Sexual Activity   Alcohol use: No    Alcohol/week: 0.0 standard drinks of alcohol   Drug use: No   Sexual activity: Yes    Birth control/protection: Post-menopausal  Other Topics Concern   Not on file  Social History Narrative   MARRIED FOR 56 YRS. 5 KIDS: #4 PRESENT TODAY(AGE 37)   Social Drivers of Corporate investment banker Strain: Not on file  Food Insecurity: Not on file  Transportation Needs: Not on file  Physical Activity: Not on file  Stress: Not on file  Social Connections: Not on file  Intimate Partner Violence: Not on file   Family History  Problem Relation Age of Onset   Heart failure Mother    Hypertension Mother    Heart failure Sister    Hypertension Father    Cancer Sister    Dementia Sister    Colon cancer Neg Hx    Colon polyps Neg Hx    Past Surgical History:  Procedure Laterality Date   BREAST BIOPSY Right 05/03/2022   DUCT ECTASIA WITH FOCAL PAPILLARY  EPITHELIAL HYPERPLASIA WITHOUT CYTOLOGIC ATYPIA-NEGATIVE FOR  MALIGNANCY   BREAST LUMPECTOMY Right 06/22/2022   CHOLECYSTECTOMY N/A 03/16/2022   Procedure: LAPAROSCOPIC CHOLECYSTECTOMY;  Surgeon: Awilda Bogus, MD;  Location: [facility];  Service: General;  Laterality: N/A;   COLONOSCOPY     3 SIMPLE ADENOMAS, AGE 85   COLONOSCOPY N/A 05/24/2018   Procedure: COLONOSCOPY;  Surgeon: Alyce Jubilee, MD;  Location: [facility];  Service: Endoscopy;  Laterality: N/A;  1:00pm   ENDOSCOPIC RETROGRADE CHOLANGIOPANCREATOGRAPHY (ERCP) WITH PROPOFOL  N/A 03/15/2022   Procedure: ENDOSCOPIC RETROGRADE CHOLANGIOPANCREATOGRAPHY (ERCP) WITH PROPOFOL ;  Surgeon: Ruby Corporal, MD;  Location: [facility];   Service: Endoscopy;  Laterality: N/A;  with sphincterotomy and stone retrieval   EP IMPLANTABLE DEVICE N/A 11/24/2015   Procedure: Pacemaker Implant;  Surgeon: Jolly Needle, MD;  Location: [facility] INVASIVE CV LAB;  Service: Cardiovascular;  Laterality: N/A;   IR EXCHANGE BILIARY DRAIN  07/27/2021   IR EXCHANGE BILIARY DRAIN  08/23/2021   IR EXCHANGE BILIARY DRAIN  02/25/2022   IR PERC CHOLECYSTOSTOMY  07/08/2021   IR PERC CHOLECYSTOSTOMY  11/04/2021   IR RADIOLOGIST EVAL & MGMT  09/22/2021   JOINT REPLACEMENT     knees bilat.   LEFT HEART CATH AND CORONARY ANGIOGRAPHY N/A 07/02/2021   Procedure: LEFT HEART CATH AND CORONARY ANGIOGRAPHY;  Surgeon: Lucendia Rusk, MD;  Location: [facility] INVASIVE CV  LAB;  Service: Cardiovascular;  Laterality: N/A;   POLYPECTOMY  05/24/2018   Procedure: POLYPECTOMY;  Surgeon: Alyce Jubilee, MD;  Location: [facility];  Service: Endoscopy;;  ascending and hepatic flexure, transverse   REMOVAL OF STONES N/A 03/15/2022   Procedure: STONE AND SLUDGE EXTRACTION;  Surgeon: Ruby Corporal, MD;  Location: [facility];  Service: Endoscopy;  Laterality: N/A;   TEE WITHOUT CARDIOVERSION N/A 08/27/2021   Procedure: TRANSESOPHAGEAL ECHOCARDIOGRAM (TEE);  Surgeon: Hazle Lites, MD;  Location: [facility] ENDOSCOPY;  Service: Cardiovascular;  Laterality: N/A;   TUBAL Natalie Oar, MD 05/25/24 1039

## 2024-05-25 NOTE — ED Triage Notes (Addendum)
 Pt reports productive cough for last two weeks. Has been taking "diabetic tussin" with minimal change in symptoms. Denies any known fevers. Pt reports was recently treated for UTI and finished abx on Wednesday.

## 2024-05-25 NOTE — Discharge Instructions (Signed)
 Your blood pressure was noted to be elevated during your visit today. If you are currently taking medication for high blood pressure, please ensure you are taking this as directed. If you do not have a history of high blood pressure and your blood pressure remains persistently elevated, you may need to begin taking a medication at some point. You may return here within the next few days to recheck if unable to see your primary care provider or if you do not have a one.  BP (!) 162/67 (BP Location: Right Arm)   Pulse 70   Temp 99.5 F (37.5 C) (Oral)   Resp 20   SpO2 94%   BP Readings from Last 3 Encounters:  05/25/24 (!) 162/67  05/01/24 (!) 127/56  04/28/24 (!) 125/95

## 2024-05-29 ENCOUNTER — Encounter (INDEPENDENT_AMBULATORY_CARE_PROVIDER_SITE_OTHER): Payer: Self-pay | Admitting: Gastroenterology

## 2024-05-29 ENCOUNTER — Ambulatory Visit (INDEPENDENT_AMBULATORY_CARE_PROVIDER_SITE_OTHER): Admitting: Gastroenterology

## 2024-05-29 VITALS — BP 128/67 | HR 75 | Temp 98.3°F | Ht 65.0 in | Wt 210.2 lb

## 2024-05-29 DIAGNOSIS — Z860100 Personal history of colon polyps, unspecified: Secondary | ICD-10-CM | POA: Insufficient documentation

## 2024-05-29 DIAGNOSIS — K529 Noninfective gastroenteritis and colitis, unspecified: Secondary | ICD-10-CM | POA: Diagnosis not present

## 2024-05-29 DIAGNOSIS — K9089 Other intestinal malabsorption: Secondary | ICD-10-CM | POA: Insufficient documentation

## 2024-05-29 DIAGNOSIS — K909 Intestinal malabsorption, unspecified: Secondary | ICD-10-CM | POA: Insufficient documentation

## 2024-05-29 DIAGNOSIS — E119 Type 2 diabetes mellitus without complications: Secondary | ICD-10-CM

## 2024-05-29 MED ORDER — PSYLLIUM 58.6 % PO PACK: 1.0000 | PACK | Freq: Two times a day (BID) | ORAL | 2 refills | Status: AC

## 2024-05-29 MED ORDER — CHOLESTYRAMINE 4 G PO PACK
4.0000 g | PACK | Freq: Every day | ORAL | 5 refills | Status: DC
Start: 2024-05-29 — End: 2024-09-26

## 2024-05-29 NOTE — Patient Instructions (Signed)
 It was very nice to meet you today, as dicussed with will plan for the following :  1) Ensure adequate fluid intake: Aim for 8 glasses of water  daily. Follow a high fiber diet: Include foods such as dates, prunes, pears, and kiwi. Use Metamucil twice a day. Start cholestyramine ,4 hours away all your medications  2) xray abdomen   3) stool samples ,3 of them

## 2024-05-29 NOTE — Progress Notes (Signed)
 Natalie Castro , M.D. Gastroenterology & Hepatology [facility] Gastroenterology [address] [place], [place] [address] Primary Care Physician: Omie Bickers, MD [address] [place] [address]  Chief Complaint: Chronic diarrhea  History of Present Illness: Natalie Castro is a 83 y.o. female with history of breast cancer, hypertension, hyperlipidemia, diabetes who presents for evaluation of chronic diarrhea  Patient reports since her cholecystectomy 2023 she never had regular bowel movements.  She would have 6-7 bowel movements daily even would wake up in the middle of the night.  Denies any hard stool or straining.  Denies any blood in stool.  Has a good appetite and not losing weight. The patient denies having any nausea, vomiting, fever, chills, hematochezia, melena, hematemesis, abdominal distention, abdominal pain, jaundice, pruritus or weight loss.  Last WGN:FAOZ Last Colonoscopy:2019  - Four 2 to 4 mm polyps in the distal transverse colon, at the hepatic flexure( 2) and in the ascending colon, removed with a cold snare. Resected and retrieved. - Redundant LEFT colon. - External and internal hemorrhoids.   FHx: neg for any gastrointestinal/liver disease, no malignancies Social: neg smoking, alcohol or illicit drug use  Labs from 04/2024 normal liver enzymes albumin 3.0 hemoglobin 11.5 platelet 209  CT head IMPRESSION: Chronic white matter ischemic changes without acute abnormality.  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 Nunzio Belch 11/24/15   Dyspnea    Essential hypertension    History of pulmonary embolism 2017   unprovoked, long term anticoag with apixaban     Hyperlipidemia    Myoview  03/2021    Myoview  4/22: EF 50, no infarct or  ischemia; low risk   Presence of permanent cardiac pacemaker    Type 2 diabetes mellitus (HCC)     Past Surgical History: Past Surgical History:  Procedure Laterality Date   BREAST BIOPSY Right 05/03/2022   DUCT ECTASIA WITH FOCAL PAPILLARY  EPITHELIAL HYPERPLASIA WITHOUT CYTOLOGIC ATYPIA-NEGATIVE FOR  MALIGNANCY   BREAST LUMPECTOMY Right 06/22/2022   CHOLECYSTECTOMY N/A 03/16/2022   Procedure: LAPAROSCOPIC CHOLECYSTECTOMY;  Surgeon: Awilda Bogus, MD;  Location: [facility];  Service: General;  Laterality: N/A;   COLONOSCOPY     3 SIMPLE ADENOMAS, AGE 48   COLONOSCOPY N/A 05/24/2018   Procedure: COLONOSCOPY;  Surgeon: Alyce Jubilee, MD;  [facility];  Service: Endoscopy;  Laterality: N/A;  1:00pm   ENDOSCOPIC RETROGRADE CHOLANGIOPANCREATOGRAPHY (ERCP) WITH PROPOFOL  N/A 03/15/2022   Procedure: ENDOSCOPIC RETROGRADE CHOLANGIOPANCREATOGRAPHY (ERCP) WITH PROPOFOL ;  Surgeon: Ruby Corporal, MD;  Location: [facility];  Service: Endoscopy;  Laterality: N/A;  with sphincterotomy and stone retrieval   EP IMPLANTABLE DEVICE N/A 11/24/2015   Procedure: Pacemaker Implant;  Surgeon: Jolly Needle, MD;  Location: [facility] IN[facility] CV LAB;  Service: Cardiovascular;  Laterality: N/A;   IR EXCHANGE BILIARY DRAIN  07/27/2021   IR EXCHANGE BILIARY DRAIN  08/23/2021   IR EXCHANGE BILIARY DRAIN  02/25/2022   IR PERC CHOLECYSTOSTOMY  07/08/2021   IR PERC CHOLECYSTOSTOMY  11/04/2021   IR RADIOLOGIST EVAL & MGMT  09/22/2021   JOINT REPLACEMENT     knees bilat.   LEFT HEART CATH AND CORONARY ANGIOGRAPHY N/A 07/02/2021   Procedure: LEFT HEART  CATH AND CORONARY ANGIOGRAPHY;  Surgeon: Lucendia Rusk, MD;  Location: [facility] INVASIVE CV LAB;  Service: Cardiovascular;  Laterality: N/A;   POLYPECTOMY  05/24/2018   Procedure: POLYPECTOMY;  Surgeon: Alyce Jubilee, MD;  [facility];  Service: Endoscopy;;  ascending and hepatic flexure, transverse   REMOVAL OF STONES N/A 03/15/2022   Procedure:  STONE AND SLUDGE EXTRACTION;  Surgeon: Ruby Corporal, MD;  Location: [facility];  Service: Endoscopy;  Laterality: N/A;   TEE WITHOUT CARDIOVERSION N/A 08/27/2021   Procedure: TRANSESOPHAGEAL ECHOCARDIOGRAM (TEE);  Surgeon: Hazle Lites, MD;  Location: [facility] ENDOSCOPY;  Service: Cardiovascular;  Laterality: N/A;   TUBAL LIGATION      Family History: Family History  Problem Relation Age of Onset   Heart failure Mother    Hypertension Mother    Heart failure Sister    Hypertension Father    Cancer Sister    Dementia Sister    Colon cancer Neg Hx    Colon polyps Neg Hx     Social History: Social History   Tobacco Use  Smoking Status Never  Smokeless Tobacco Never   Social History   Substance and Sexual Activity  Alcohol Use No   Alcohol/week: 0.0 standard drinks of alcohol   Social History   Substance and Sexual Activity  Drug Use No    Allergies: Allergies  Allergen Reactions   Penicillins Hives, Itching and Other (See Comments)    Has tolerated ceftriaxone  7/22 Severe rash involving mucus membranes or skin necrosis    Medications: Current Outpatient Medications  Medication Sig Dispense Refill   anastrozole  (ARIMIDEX ) 1 MG tablet TAKE 1 TABLET(1 MG) BY MOUTH DAILY 90 tablet 3   apixaban  (ELIQUIS ) 5 MG TABS tablet Take 1 tablet (5 mg total) by mouth 2 (two) times daily. 60 tablet 0   atorvastatin  (LIPITOR) 40 MG tablet Take 1 tablet (40 mg total) by mouth daily. (Patient taking differently: Take 40 mg by mouth at bedtime.) 30 tablet 0   benzonatate (TESSALON) 100 MG capsule Take 1 capsule by mouth every 8 (eight) hours for cough. 21 capsule 0   ferrous sulfate  325 (65 FE) MG EC tablet Take 1 tablet (325 mg total) by mouth daily with breakfast. 90 tablet 3   furosemide  (LASIX ) 40 MG tablet Take 1 tablet (40 mg) daily Monday, Wednesday, Friday. Take 2 tablets (80 mg total) all other days. 140 tablet 3   glucose blood test strip Use to check blood glucose fasting ,  before lunch and dinner and after your largest meal 120 each 0   Insulin  Pen Needle (BD AUTOSHIELD DUO) 30G X 5 MM MISC 30 gauge x 3/16" (pen needle,diabetic dual safty)     Lancets (ONETOUCH ULTRASOFT) lancets Use to check glucose 4 x daily 120 each 0   LANTUS  SOLOSTAR 100 UNIT/ML Solostar Pen Inject 10 Units into the skin at bedtime. (Patient taking differently: Inject 25 Units into the skin at bedtime.) 15 mL 0   losartan  (COZAAR ) 100 MG tablet Take 100 mg by mouth daily.     metFORMIN  (GLUCOPHAGE ) 1000 MG tablet TAKE (1) TABLET BY MOUTH TWICE DAILY. 60 tablet 0   metoprolol  succinate (TOPROL -XL) 50 MG 24 hr tablet TAKE 1 TABLET(50 MG) BY MOUTH IN THE MORNING AND AT BEDTIME WITH OR IMMEDIATELY FOLLOWING A MEAL 180 tablet 2   potassium chloride  (KLOR-CON ) 10 MEQ tablet Take 10 mEq by mouth daily.     No current facility-administered medications for this visit.  Review of Systems: GENERAL: negative for malaise, night sweats HEENT: No changes in hearing or vision, no nose bleeds or other nasal problems. NECK: Negative for lumps, goiter, pain and significant neck swelling RESPIRATORY: Negative for cough, wheezing CARDIOVASCULAR: Negative for chest pain, leg swelling, palpitations, orthopnea GI: SEE HPI MUSCULOSKELETAL: Negative for joint pain or swelling, back pain, and muscle pain. SKIN: Negative for lesions, rash HEMATOLOGY Negative for prolonged bleeding, bruising easily, and swollen nodes. ENDOCRINE: Negative for cold or heat intolerance, polyuria, polydipsia and goiter. NEURO: negative for tremor, gait imbalance, syncope and seizures. The remainder of the review of systems is noncontributory.   Physical Exam: BP 128/67   Pulse 75   Temp 98.3 F (36.8 C)   Ht 5\' 5"  (1.651 m)   Wt 210 lb 3.2 oz (95.3 kg)   BMI 34.98 kg/m  GENERAL: The patient is AO x3, in no acute distress. HEENT: Head is normocephalic and atraumatic. EOMI are intact. Mouth is well hydrated and without  lesions. NECK: Supple. No masses LUNGS: Clear to auscultation. No presence of rhonchi/wheezing/rales. Adequate chest expansion HEART: RRR, normal s1 and s2. ABDOMEN: Soft, nontender, no guarding, no peritoneal signs, and nondistended. BS +. No masses.    Imaging/Labs: as above     Latest Ref Rng & Units 05/01/2024    2:46 PM 04/28/2024    6:09 PM 02/01/2024    1:52 PM  CBC  WBC 4.0 - 10.5 K/uL 6.3  8.3  7.1   Hemoglobin 12.0 - 15.0 g/dL 16.1  09.6  04.5   Hematocrit 36.0 - 46.0 % 35.6  40.5  37.2   Platelets 150 - 400 K/uL 209  193  195    Lab Results  Component Value Date   IRON 51 05/01/2024   TIBC 230 (L) 05/01/2024   FERRITIN 58 05/01/2024    I personally reviewed and interpreted the available labs, imaging and endoscopic files.  Impression and Plan:   Natalie Castro is a 83 y.o. 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 difficile and GI PCR Fecal calprotectin to screen for inflammatory bowel disease  Abdominal x-ray to evaluate stool burden and ensure we are not dealing with overflow diarrhea.  If large amount is found we will give bowel purge  Metamucil twice daily  Clinical suspicion for bile acid  diarrhea will give cholestyramine trial  CREON samples given to see response as patient does have diabetes which could have component of exocrine pancreatic insufficiency  All questions were answered.      Corneisha Alvi Faizan Micole Delehanty, MD Gastroenterology and Hepatology [facility] Gastroenterology   This chart has been completed using [facility] Dictation software, and while attempts have been made to ensure accuracy , certain words and phrases may not be transcribed as  intended

## 2024-05-30 DIAGNOSIS — R197 Diarrhea, unspecified: Secondary | ICD-10-CM | POA: Diagnosis not present

## 2024-05-30 DIAGNOSIS — K909 Intestinal malabsorption, unspecified: Secondary | ICD-10-CM | POA: Diagnosis not present

## 2024-05-31 ENCOUNTER — Ambulatory Visit (HOSPITAL_COMMUNITY)
Admission: RE | Admit: 2024-05-31 | Discharge: 2024-05-31 | Disposition: A | Discharge status: Home / Self Care | Source: Ambulatory Visit | Attending: Gastroenterology | Admitting: Gastroenterology

## 2024-05-31 DIAGNOSIS — K909 Intestinal malabsorption, unspecified: Secondary | ICD-10-CM | POA: Insufficient documentation

## 2024-05-31 DIAGNOSIS — R197 Diarrhea, unspecified: Secondary | ICD-10-CM | POA: Diagnosis not present

## 2024-05-31 DIAGNOSIS — K529 Noninfective gastroenteritis and colitis, unspecified: Secondary | ICD-10-CM | POA: Diagnosis not present

## 2024-06-01 LAB — GI PROFILE, STOOL, PCR

## 2024-06-03 ENCOUNTER — Ambulatory Visit: Payer: Self-pay | Admitting: Internal Medicine

## 2024-06-03 DIAGNOSIS — A0472 Enterocolitis due to Clostridium difficile, not specified as recurrent: Secondary | ICD-10-CM

## 2024-06-03 LAB — C DIFFICILE, CYTOTOXIN B

## 2024-06-03 LAB — C DIFFICILE TOXINS A+B W/RFLX: C difficile Toxins A+B, EIA: NEGATIVE

## 2024-06-03 NOTE — Progress Notes (Unsigned)
 [facility] 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, a 83 y.o. female, returns for routine follow-up of her right breast DCIS and B12 deficiency. Jaelyne was last evaluated via telemedicine visit by Sheril Dines PA-C on 02/05/2024.  At today's visit, she  reports feeling ***. ***She denies any recent hospitalizations, surgeries, or changes in her  baseline health status. ***She reports ***% energy and ***% appetite.  She is maintaining stable weight at this time.  RIGHT BREAST DCIS: ***She denies any new breast lumps or axillary lymphadenopathy. ***She has chronic dyspnea on exertion and orthopnea in the setting of CHF. ***She denies any new onset bone pain, chest pain, dyspnea, or abdominal pain ***No new headaches, seizures, neurologic deficits. ***No B  symptoms. *** She continues to take *** daily Arimidex  and reports mild hot flashes without any other adverse effects.  ANEMIA: ***She continues to receive monthly B12 injections and take daily iron tablet. ***She has occasional scant rectal bleeding when wiping, but denies any gross hematochezia or melena. ***She has low energy that waxes and wanes. ***She denies any ice pica.   ASSESSMENT & PLAN:  1.  Right breast DCIS: - Mammogram (04/21/2022): Highly suspicious 2.4 cm mass in the right breast at 6:00.  0.6 cm mass and a dilated duct in the lateral areola right breast at 6:00.  No evidence of axillary adenopathy.  No evidence of left breast malignancy. - Biopsy (04/26/2022): Right breast subareolar biopsy with marked duct ectasia with focal papillary epithelial hyperplasia without cytological atypia.  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  Grade 1.  No evidence of invasion.  ER 100%, PR 100%, Ki-67 10%, HER2 2+ by IHC, negative by FISH. - Lumpectomy (06/22/2022): 3.1 x 2.2 x 1.2 cm DCIS, grade 2, intracystic papillary carcinoma with rare minute foci of microinvasion, margins negative.  ER/PR 100%.  HER2 2+ by IHC and negative by FISH.  Ki-67 10%.  pTis PNX. - Anastrozole  started on 07/04/2022.  Goal of treatment is 5 years. - She was reluctant to consider radiation. - Most recent mammogram (05/23/2023): BI-RADS Category 3, probably benign findings including new lumpectomy changes in right breast.  Asymmetry in upper central to outer right breast felt to be related to postsurgical change.  No obvious mammographic evidence of malignancy.  Repeat diagnostic right breast mammogram recommended for  6 months.   - Physical exam (***): Postsurgical nipple inversion and lumpectomy scar lateral border of right nipple.  No discrete nodules, masses, or lymphadenopathy. - Tolerating anastrozole  well outside of mild hot flashes *** - No "red  flag" symptoms per history/ROS today *** - Most recent labs (05/01/2024): Normal LFTs. - PLAN: Bilateral screening mammogram due now (June 2025).*** - Continue anastrozole  daily.*** - RTC in 6 months with repeat labs and office visit    2.  Bone health: - DEXA/bone density (05/25/2022): T-score -0.7, normal - Vitamin D  level from 05/01/2024 normal at 66.21.  Calcium  8.7 (corrected calcium  normal). - She is taking vitamin D  4000 units daily *** - PLAN: Continue vitamin D , but DECREASE to 2000 units daily ***  - She is due for repeat DEXA scan   3.  Mild normocytic anemia secondary to vitamin B12 and iron deficiencies - Labs from 11/01/2023 show Hgb 11.9/MCV 93.0.  Ferritin 37, iron saturation 30%.  Vitamin B12 significantly low at 61.  Normal folate.  Creatinine 1.02/GFR 55 - Failed to improve on oral B12 repletion.  Monthly B12 injections since November 2024. - Taking ferrous sulfate  325 mg daily since November 2024.   - Most recent labs (05/01/2024): Hgb 11.5/MCV 81.8, ferritin 58, iron saturation 22% with low TIBC 230.  Vitamin B12 improved at 316, MMA normal - No rectal bleeding or melena*** - Symptomatic with fatigue*** - PLAN: Continue monthly B12 injections.*** - Continue ferrous sulfate  325 mg daily.*** IV iron only if significantly symptomatic *** - Recheck iron panel at follow-up.  Will recheck B12/MMA annually (June 2026)  4.  Social/family history: - She lives at home with her daughter.  Uses rollator for ambulation secondary to arthritis.  She is able to do all her ADLs.  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  Non-smoker. - Sister had metastatic breast cancer in her 45s.   PLAN SUMMARY:*** >> Due for DEXA/bone density >> Due for bilateral screening mammogram >> Continue monthly B12 injections >> Labs in 6 months = CBC/D, CMP, vitamin D , ferritin, iron/TIBC >> OFFICE visit in 6 months (1 week after labs)    REVIEW OF SYSTEMS: ***   Review of Systems - Oncology  PHYSICAL EXAM:   Performance status (ECOG): {CHL ONC NG:[id number]} *** There were no vitals filed for this visit. 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 Nunzio Belch 11/24/15   Dyspnea    Essential hypertension    History of pulmonary embolism 2017   unprovoked, long term anticoag with apixaban     Hyperlipidemia    Myoview  03/2021    Myoview  4/22: EF 50, no infarct or ischemia; low risk   Presence of permanent cardiac pacemaker    Type 2 diabetes mellitus (HCC)    Past Surgical History:  Procedure Laterality Date   BREAST BIOPSY Right 05/03/2022   DUCT ECTASIA WITH FOCAL PAPILLARY  EPITHELIAL HYPERPLASIA WITHOUT CYTOLOGIC ATYPIA-NEGATIVE FOR  MALIGNANCY   BREAST LUMPECTOMY Right  06/22/2022   CHOLECYSTECTOMY N/A 03/16/2022   Procedure: LAPAROSCOPIC CHOLECYSTECTOMY;  Surgeon: Awilda Bogus, MD;  Location: [facility];  Service: General;  Laterality: N/A;   COLONOSCOPY     3 SIMPLE ADENOMAS, AGE 71   COLONOSCOPY N/A 05/24/2018   Procedure: COLONOSCOPY;  Surgeon: Alyce Jubilee, MD;  Location: [facility] ENDO SUITE;  Service: Endoscopy;  Laterality: N/A;  1:00pm   ENDOSCOPIC RETROGRADE CHOLANGIOPANCREATOGRAPHY (ERCP) WITH PROPOFOL  N/A 03/15/2022   Procedure: ENDOSCOPIC RETROGRADE CHOLANGIOPANCREATOGRAPHY (ERCP) WITH PROPOFOL ;  Surgeon: Ruby Corporal, MD;  Location: [facility];  Service: Endoscopy;  Laterality: N/A;  with sphincterotomy and stone retrieval   EP IMPLANTABLE DEVICE N/A 11/24/2015   Procedure: Pacemaker Implant;  Surgeon: Jolly Needle, MD;  Location: [facility] INVASIVE CV LAB;  Service:  Cardiovascular;  Laterality: N/A;   IR EXCHANGE BILIARY DRAIN  07/27/2021   IR EXCHANGE BILIARY DRAIN  08/23/2021   IR EXCHANGE BILIARY DRAIN  02/25/2022   IR PERC CHOLECYSTOSTOMY  07/08/2021   IR PERC CHOLECYSTOSTOMY  11/04/2021   IR RADIOLOGIST EVAL & MGMT  09/22/2021   JOINT REPLACEMENT     knees bilat.   LEFT HEART CATH AND CORONARY ANGIOGRAPHY N/A 07/02/2021   Procedure: LEFT HEART CATH AND CORONARY ANGIOGRAPHY;  Surgeon: Lucendia Rusk, MD;  Location: [facility] INVASIVE CV LAB;  Service: Cardiovascular;  Laterality: N/A;   POLYPECTOMY  05/24/2018   Procedure: POLYPECTOMY;  Surgeon: Alyce Jubilee, MD;  Location: [facility];  Service: Endoscopy;;  ascending and hepatic flexure, transverse   REMOVAL OF STONES N/A 03/15/2022   Procedure: STONE AND SLUDGE EXTRACTION;  Surgeon: Ruby Corporal, MD;  Location: [facility];  Service: Endoscopy;  Laterality: N/A;   TEE WITHOUT CARDIOVERSION N/A 08/27/2021   Procedure: TRANSESOPHAGEAL ECHOCARDIOGRAM (TEE);  Surgeon: Hazle Lites, MD;  Location: [facility] ENDOSCOPY;  Service: Cardiovascular;  Laterality: N/A;   TUBAL LIGATION      SOCIAL HISTORY:  Social History   Socioeconomic History   Marital status: Widowed    Spouse name: Not on file   Number of children: Not on file   Years of education: Not on file   Highest education level: Not on file  Occupational History   Occupation: retired  Tobacco Use   Smoking status: Never   Smokeless tobacco: Never  Vaping Use   Vaping status: Never Used  Substance and Sexual Activity   Alcohol use: No    Alcohol/week: 0.0 standard drinks of alcohol   Drug use: No   Sexual activity: Yes    Birth control/protection: Post-menopausal  Other Topics Concern   Not on file  Social History Narrative   MARRIED FOR 68 YRS. 5 KIDS: #4 PRESENT TODAY(AGE 75)   Social Drivers of Corporate investment banker Strain: Not on file  Food Insecurity: Not on file  Transportation Needs: Not on file  Physical  Activity: Not on file  Stress: Not on file  Social Connections: Not on file  Intimate Partner Violence: Not on file    FAMILY HISTORY:  Family History  Problem Relation Age of Onset   Heart failure Mother    Hypertension Mother    Heart failure Sister    Hypertension Father    Cancer Sister    Dementia Sister    Colon cancer Neg Hx    Colon polyps Neg Hx     CURRENT MEDICATIONS:  Current Outpatient Medications  Medication Sig Dispense Refill   anastrozole  (ARIMIDEX ) 1 MG tablet TAKE 1 TABLET(1 MG)  BY MOUTH DAILY 90 tablet 3   apixaban  (ELIQUIS ) 5 MG TABS tablet Take 1 tablet (5 mg total) by mouth 2 (two) times daily. 60 tablet 0   atorvastatin  (LIPITOR) 40 MG tablet Take 1 tablet (40 mg total) by mouth daily. (Patient taking differently: Take 40 mg by mouth at bedtime.) 30 tablet 0   benzonatate  (TESSALON ) 100 MG capsule Take 1 capsule by mouth every 8 (eight) hours for cough. 21 capsule 0   cholestyramine  (QUESTRAN ) 4 g packet Take 1 packet (4 g total) by mouth daily. Mix with 4-6 oz liquid.  Take other meds 1 hr before or 4-6 hr after cholestyramine . 30 packet 5   ferrous sulfate  325 (65 FE) MG EC tablet Take 1 tablet (325 mg total) by mouth daily with breakfast. 90 tablet 3   furosemide  (LASIX ) 40 MG tablet Take 1 tablet (40 mg) daily Monday, Wednesday, Friday. Take 2 tablets (80 mg total) all other days. 140 tablet 3   glucose blood test strip Use to check blood glucose fasting , before lunch and dinner and after your largest meal 120 each 0   Insulin  Pen Needle (BD AUTOSHIELD DUO) 30G X 5 MM MISC 30 gauge x 3/16" (pen needle,diabetic dual safty)     Lancets (ONETOUCH ULTRASOFT) lancets Use to check glucose 4 x daily 120 each 0   LANTUS  SOLOSTAR 100 UNIT/ML Solostar Pen Inject 10 Units into the skin at bedtime. (Patient taking differently: Inject 25 Units into the skin at bedtime.) 15 mL 0   losartan  (COZAAR ) 100 MG tablet Take 100 mg by mouth daily.     metFORMIN  (GLUCOPHAGE )  1000 MG tablet TAKE (1) TABLET BY MOUTH TWICE DAILY. 60 tablet 0   metoprolol  succinate (TOPROL -XL) 50 MG 24 hr tablet TAKE 1 TABLET(50 MG) BY MOUTH IN THE MORNING AND AT BEDTIME WITH OR IMMEDIATELY FOLLOWING A MEAL 180 tablet 2   potassium chloride  (KLOR-CON ) 10 MEQ tablet Take 10 mEq by mouth daily.     psyllium (METAMUCIL) 58.6 % packet Take 1 packet by mouth 2 (two) times daily. 60 packet 2   No current facility-administered medications for this visit.    ALLERGIES:  Allergies  Allergen Reactions   Penicillins Hives, Itching and Other (See Comments)    Has tolerated ceftriaxone  7/22 Severe rash involving mucus membranes or skin necrosis    LABORATORY DATA:  I have reviewed the labs as listed.     Latest Ref Rng & Units 05/01/2024    2:46 PM 04/28/2024    6:09 PM 02/01/2024    1:52 PM  CBC  WBC 4.0 - 10.5 K/uL 6.3  8.3  7.1   Hemoglobin 12.0 - 15.0 g/dL 95.1  88.4  16.6   Hematocrit 36.0 - 46.0 % 35.6  40.5  37.2   Platelets 150 - 400 K/uL 209  193  195       Latest Ref Rng & Units 05/01/2024    2:46 PM 04/28/2024    6:09 PM 11/01/2023    1:15 PM  CMP  Glucose 70 - 99 mg/dL 063  016  010   BUN 8 - 23 mg/dL 16  23  21    Creatinine 0.44 - 1.00 mg/dL 9.32  3.55  7.32   Sodium 135 - 145 mmol/L 139  140  139   Potassium 3.5 - 5.1 mmol/L 3.7  4.3  3.7   Chloride 98 - 111 mmol/L 102  104  103   CO2 22 - 32 mmol/L  26  24  26    Calcium  8.9 - 10.3 mg/dL 8.7  9.1  8.8   Total Protein 6.5 - 8.1 g/dL 6.9  7.7  7.2   Total Bilirubin 0.0 - 1.2 mg/dL 0.7  0.5  0.5   Alkaline Phos 38 - 126 U/L 67  73  73   AST 15 - 41 U/L 16  18  13    ALT 0 - 44 U/L 9  10  10      DIAGNOSTIC IMAGING:  I have independently reviewed the scans and discussed with the patient. DG Abd 2 Views Result Date: 05/31/2024 CLINICAL DATA:  Chronic diarrhea.  Assess stool burden. EXAM: ABDOMEN - 2 VIEW COMPARISON:  03/13/2022 CT FINDINGS: No small bowel dilatation or evidence of obstruction. Small to moderate volume of  stool throughout the colon. No abnormal rectal distention. No visible radiopaque calculi. Surgical clips in the right upper quadrant. IMPRESSION: Small to moderate volume of colonic stool. No bowel obstruction. Electronically Signed   By: Chadwick Colonel M.D.   On: 05/31/2024 23:26   DG Chest 2 View Result Date: 05/25/2024 CLINICAL DATA:  productive cough x 2 weeks EXAM: CHEST - 2 VIEW COMPARISON:  04/28/2024. FINDINGS: Cardiac silhouette enlarged. No evidence of pneumothorax or pleural effusion. No evidence of pulmonary edema. Aorta is calcified. There are thoracic degenerative changes. There is a left-sided pacer. IMPRESSION: Enlarged cardiac silhouette. Electronically Signed   By: Sydell Eva M.D.   On: 05/25/2024 09:53   CUP PACEART REMOTE DEVICE CHECK Result Date: 05/15/2024 Scheduled remote reviewed. Normal device function.  Presenting rhythm: AP/VP. 2 AMS episodes, longest 8 sec. Next remote 91 days. [facility], [facility]    WRAP UP:  All questions were answered. The patient knows to call the clinic with any problems, questions or concerns.  Medical decision making: ***  Time spent on visit: I spent {CHL ONC TIME VISIT - WUJWJ:[id number]} counseling the patient face to face. The total time spent in the appointment was {CHL ONC TIME VISIT - OZHYQ:[id number]} and more than 50% was on counseling.  Sonnie Dusky, PA-C  ***

## 2024-06-04 ENCOUNTER — Other Ambulatory Visit (HOSPITAL_COMMUNITY): Payer: Self-pay | Admitting: Physician Assistant

## 2024-06-04 ENCOUNTER — Inpatient Hospital Stay

## 2024-06-04 ENCOUNTER — Inpatient Hospital Stay: Attending: Hematology | Admitting: Physician Assistant

## 2024-06-04 VITALS — BP 176/66 | HR 59 | Temp 97.5°F | Resp 20 | Wt 211.6 lb

## 2024-06-04 DIAGNOSIS — Z1231 Encounter for screening mammogram for malignant neoplasm of breast: Secondary | ICD-10-CM | POA: Diagnosis not present

## 2024-06-04 DIAGNOSIS — E538 Deficiency of other specified B group vitamins: Secondary | ICD-10-CM | POA: Diagnosis not present

## 2024-06-04 DIAGNOSIS — D0581 Other specified type of carcinoma in situ of right breast: Secondary | ICD-10-CM | POA: Diagnosis not present

## 2024-06-04 DIAGNOSIS — Z853 Personal history of malignant neoplasm of breast: Secondary | ICD-10-CM

## 2024-06-04 DIAGNOSIS — D508 Other iron deficiency anemias: Secondary | ICD-10-CM | POA: Diagnosis not present

## 2024-06-04 DIAGNOSIS — Z79811 Long term (current) use of aromatase inhibitors: Secondary | ICD-10-CM

## 2024-06-04 DIAGNOSIS — D649 Anemia, unspecified: Secondary | ICD-10-CM

## 2024-06-04 LAB — CALPROTECTIN, FECAL: Calprotectin, Fecal: 32 ug/g (ref 0–120)

## 2024-06-04 MED ORDER — FERROUS SULFATE 325 (65 FE) MG PO TBEC: 325.0000 mg | DELAYED_RELEASE_TABLET | Freq: Every day | ORAL | 3 refills | Status: AC

## 2024-06-04 MED ORDER — CYANOCOBALAMIN 1000 MCG/ML IJ SOLN
1000.0000 ug | Freq: Once | INTRAMUSCULAR | Status: AC
  Administered 2024-06-04: 1000 ug via INTRAMUSCULAR
  Filled 2024-06-04: qty 1

## 2024-06-04 NOTE — Patient Instructions (Signed)
 [facility] at [facility] **VISIT SUMMARY & IMPORTANT INSTRUCTIONS **   You were seen today by Sheril Dines PA-C for your follow-up visit.    HISTORY OF BREAST CANCER Your physical exam and labs did not show any evidence of recurrent breast cancer at this time. You are due for your annual screening mammogram. Continue taking anastrozole  ("breast cancer pill") daily. You are also due for your bone density scan. (We check this every 2 years while you are on the breast cancer pill, because breast cancer pill can cause some decreased bone density and osteoporosis.)  ANEMIA You have mild anemia (low red blood cells). This is due to iron deficiency and vitamin B12 deficiency. Please continue taking iron pill (ferrous sulfate  325 mg) every day.  Take this with a glass of orange juice to help your body absorb it better. We will give vitamin B12 injections once a month.  FOLLOW-UP APPOINTMENT: Office visit in 6 months  ** Thank you for trusting me with your healthcare!  I strive to provide all of my patients with quality care at each visit.  If you receive a survey for this visit, I would be so grateful to you for taking the time to provide feedback.  Thank you in advance!  ~ Jaymee Tilson                   Dr. Paulett Boros   &   Sheril Dines, PA-C   - - - - - - - - - - - - - - - - - -    Thank you for choosing [facility] at [facility] to provide your oncology and hematology care.  To afford each patient quality time with our provider, please arrive at least 15 minutes before your scheduled appointment time.   If you have a lab appointment with the Cancer Center please come in thru the Main Entrance and check in at the main information desk.  You need to re-schedule your appointment should you arrive 10 or more minutes late.  We strive to give you quality time with our providers, and arriving late affects you and other patients whose  appointments are after yours.  Also, if you no show three or more times for appointments you may be dismissed from the clinic at the providers discretion.     Again, thank you for choosing [facility].  Our hope is that these requests will decrease the amount of time that you wait before being seen by our physicians.       _____________________________________________________________  Should you have questions after your visit to [facility], please contact our office at [phone] and follow the prompts.  Our office hours are 8:00 a.m. and 4:30 p.m. Monday - Friday.  Please note that voicemails left after 4:00 p.m. may not be returned until the following business day.  We are closed weekends and major holidays.  You do have access to a nurse 24-7, just call the main number to the clinic [phone] and do not press any options, hold on the line and a nurse will answer the phone.    For prescription refill requests, have your pharmacy contact our office and allow 72 hours.

## 2024-06-04 NOTE — Addendum Note (Signed)
 Addended by: Sheril Dines on: 06/04/2024 02:28 PM   Modules accepted: Orders

## 2024-06-04 NOTE — Patient Instructions (Signed)
 [facility] [place] - A DEPT OF [facility]  Discharge Instructions: Thank you for choosing [facility] to provide your oncology and hematology care.  If you have a lab appointment with the Cancer Center - please note that after April 8th, 2024, all labs will be drawn in the cancer center.  You do not have to check in or register with the main entrance as you have in the past but will complete your check-in in the cancer center.  Wear comfortable clothing and clothing appropriate for easy access to any Portacath or PICC line.   We strive to give you quality time with your provider. You may need to reschedule your appointment if you arrive late (15 or more minutes).  Arriving late affects you and other patients whose appointments are after yours.  Also, if you miss three or more appointments without notifying the office, you may be dismissed from the clinic at the provider's discretion.      For prescription refill requests, have your pharmacy contact our office and allow 72 hours for refills to be completed.    Today you received the following Vitamin B12 injection.      To help prevent nausea and vomiting after your treatment, we encourage you to take your nausea medication as directed.  BELOW ARE SYMPTOMS THAT SHOULD BE REPORTED IMMEDIATELY: *FEVER GREATER THAN 100.4 F (38 C) OR HIGHER *CHILLS OR SWEATING *NAUSEA AND VOMITING THAT IS NOT CONTROLLED WITH YOUR NAUSEA MEDICATION *UNUSUAL SHORTNESS OF BREATH *UNUSUAL BRUISING OR BLEEDING *URINARY PROBLEMS (pain or burning when urinating, or frequent urination) *BOWEL PROBLEMS (unusual diarrhea, constipation, pain near the anus) TENDERNESS IN MOUTH AND THROAT WITH OR WITHOUT PRESENCE OF ULCERS (sore throat, sores in mouth, or a toothache) UNUSUAL RASH, SWELLING OR PAIN  UNUSUAL VAGINAL DISCHARGE OR ITCHING   Items with * indicate a potential emergency and should be followed up as soon as possible or go  to the Emergency Department if any problems should occur.  Please show the CHEMOTHERAPY ALERT CARD or IMMUNOTHERAPY ALERT CARD at check-in to the Emergency Department and triage nurse.  Should you have questions after your visit or need to cancel or reschedule your appointment, please contact [facility] [place] - A DEPT OF [facility] [phone]  and follow the prompts.  Office hours are 8:00 a.m. to 4:30 p.m. Monday - Friday. Please note that voicemails left after 4:00 p.m. may not be returned until the following business day.  We are closed weekends and major holidays. You have access to a nurse at all times for urgent questions. Please call the main number to the clinic [phone] and follow the prompts.  For any non-urgent questions, you may also contact your provider using MyChart. We now offer e-Visits for anyone 63 and older to request care online for non-urgent symptoms. For details visit [email or web address].   Also download the MyChart app! Go to the app store, search "MyChart", open the app, select [place], and log in with your MyChart username and password.

## 2024-06-04 NOTE — Progress Notes (Signed)
 Natalie Castro presents today for injection per the provider's orders.  Vitamin B12 injection administration without incident; injection site WNL; see MAR for injection details.  Patient tolerated procedure well and without incident.  No questions or complaints noted at this time. Patient discharged ambulatory by wheelchair with family member in stable condition.

## 2024-06-10 ENCOUNTER — Other Ambulatory Visit (HOSPITAL_COMMUNITY): Payer: Self-pay

## 2024-06-10 MED ORDER — VANCOMYCIN HCL 125 MG PO CAPS
125.0000 mg | ORAL_CAPSULE | Freq: Four times a day (QID) | ORAL | 0 refills | Status: DC
  Filled 2024-06-10: qty 40, 10d supply, fill #0

## 2024-06-13 ENCOUNTER — Telehealth (INDEPENDENT_AMBULATORY_CARE_PROVIDER_SITE_OTHER): Payer: Self-pay | Admitting: Gastroenterology

## 2024-06-13 ENCOUNTER — Other Ambulatory Visit (HOSPITAL_COMMUNITY): Payer: Self-pay

## 2024-06-13 DIAGNOSIS — A0472 Enterocolitis due to Clostridium difficile, not specified as recurrent: Secondary | ICD-10-CM

## 2024-06-13 MED ORDER — VANCOMYCIN HCL 125 MG PO CAPS: 125.0000 mg | ORAL_CAPSULE | Freq: Four times a day (QID) | ORAL | 0 refills | Status: AC

## 2024-06-13 NOTE — Addendum Note (Signed)
 Addended by: Albertus Hughs on: 06/13/2024 01:11 PM   Modules accepted: Orders

## 2024-06-13 NOTE — Telephone Encounter (Signed)
 Pt contacted and verbalized understanding. Advised pt she could medication up before going out of town. Pt verbalized understanding

## 2024-06-13 NOTE — Telephone Encounter (Signed)
 Pt left voicemail stating that some medication was suppose to be called in to pharmacy on 05/29/24 visit. Pt states the pharmacy does not have anything for her. Please advise. Thank you  (Pt is going out of town tomorrow morning. Can pick up med this afternoon)

## 2024-06-13 NOTE — Telephone Encounter (Signed)
 Pt would like medication sent to [facility] on [address] Pharmacy updated in chart.  Thank you!

## 2024-06-13 NOTE — Telephone Encounter (Signed)
 Hi Natalie Castro  She had C Diff toxin positive and had prescribed her Vancomycin  to be taken my mouth for 10 days , 4 times daily   See my last order to  - [facility]   If that's not the pharmacy can you ask her and update it in the system?

## 2024-06-13 NOTE — Telephone Encounter (Signed)
 All done , please let patient pick it up

## 2024-06-27 ENCOUNTER — Ambulatory Visit (HOSPITAL_COMMUNITY)
Admission: RE | Admit: 2024-06-27 | Discharge: 2024-06-27 | Disposition: A | Discharge status: Home / Self Care | Source: Ambulatory Visit | Attending: Physician Assistant | Admitting: Physician Assistant

## 2024-06-27 ENCOUNTER — Other Ambulatory Visit: Payer: Self-pay | Admitting: Hematology

## 2024-06-27 DIAGNOSIS — D0581 Other specified type of carcinoma in situ of right breast: Secondary | ICD-10-CM

## 2024-06-27 DIAGNOSIS — Z853 Personal history of malignant neoplasm of breast: Secondary | ICD-10-CM | POA: Diagnosis not present

## 2024-06-27 DIAGNOSIS — Z79811 Long term (current) use of aromatase inhibitors: Secondary | ICD-10-CM | POA: Diagnosis not present

## 2024-06-27 DIAGNOSIS — R92333 Mammographic heterogeneous density, bilateral breasts: Secondary | ICD-10-CM | POA: Diagnosis not present

## 2024-06-27 DIAGNOSIS — R928 Other abnormal and inconclusive findings on diagnostic imaging of breast: Secondary | ICD-10-CM | POA: Diagnosis not present

## 2024-06-27 DIAGNOSIS — M8589 Other specified disorders of bone density and structure, multiple sites: Secondary | ICD-10-CM | POA: Diagnosis not present

## 2024-06-27 DIAGNOSIS — Z780 Asymptomatic menopausal state: Secondary | ICD-10-CM | POA: Diagnosis not present

## 2024-07-03 NOTE — Progress Notes (Signed)
 Remote pacemaker transmission.

## 2024-07-04 ENCOUNTER — Inpatient Hospital Stay: Attending: Hematology

## 2024-07-04 VITALS — BP 146/61 | HR 64 | Temp 98.0°F | Resp 20

## 2024-07-04 DIAGNOSIS — E538 Deficiency of other specified B group vitamins: Secondary | ICD-10-CM | POA: Insufficient documentation

## 2024-07-04 MED ORDER — CYANOCOBALAMIN 1000 MCG/ML IJ SOLN
1000.0000 ug | Freq: Once | INTRAMUSCULAR | Status: AC
  Administered 2024-07-04: 1000 ug via INTRAMUSCULAR
  Filled 2024-07-04: qty 1

## 2024-07-04 NOTE — Patient Instructions (Signed)

## 2024-07-04 NOTE — Progress Notes (Signed)
 Natalie Castro presents today for B12 injection per the provider's orders.  Stable during administration without incident; injection site WNL; see MAR for injection details.  Patient tolerated procedure well and without incident.  No questions or complaints noted at this time.

## 2024-07-26 DIAGNOSIS — I10 Essential (primary) hypertension: Secondary | ICD-10-CM | POA: Diagnosis not present

## 2024-07-26 DIAGNOSIS — E119 Type 2 diabetes mellitus without complications: Secondary | ICD-10-CM | POA: Diagnosis not present

## 2024-07-26 DIAGNOSIS — Z794 Long term (current) use of insulin: Secondary | ICD-10-CM | POA: Diagnosis not present

## 2024-08-01 DIAGNOSIS — E1122 Type 2 diabetes mellitus with diabetic chronic kidney disease: Secondary | ICD-10-CM | POA: Diagnosis not present

## 2024-08-01 DIAGNOSIS — E785 Hyperlipidemia, unspecified: Secondary | ICD-10-CM | POA: Diagnosis not present

## 2024-08-01 DIAGNOSIS — Z794 Long term (current) use of insulin: Secondary | ICD-10-CM | POA: Diagnosis not present

## 2024-08-01 DIAGNOSIS — I130 Hypertensive heart and chronic kidney disease with heart failure and stage 1 through stage 4 chronic kidney disease, or unspecified chronic kidney disease: Secondary | ICD-10-CM | POA: Diagnosis not present

## 2024-08-01 DIAGNOSIS — N1831 Chronic kidney disease, stage 3a: Secondary | ICD-10-CM | POA: Diagnosis not present

## 2024-08-01 DIAGNOSIS — Z950 Presence of cardiac pacemaker: Secondary | ICD-10-CM | POA: Diagnosis not present

## 2024-08-01 DIAGNOSIS — I10 Essential (primary) hypertension: Secondary | ICD-10-CM | POA: Diagnosis not present

## 2024-08-01 DIAGNOSIS — R0601 Orthopnea: Secondary | ICD-10-CM | POA: Diagnosis not present

## 2024-08-01 DIAGNOSIS — I5030 Unspecified diastolic (congestive) heart failure: Secondary | ICD-10-CM | POA: Diagnosis not present

## 2024-08-01 DIAGNOSIS — M5412 Radiculopathy, cervical region: Secondary | ICD-10-CM | POA: Diagnosis not present

## 2024-08-01 DIAGNOSIS — H9193 Unspecified hearing loss, bilateral: Secondary | ICD-10-CM | POA: Diagnosis not present

## 2024-08-01 DIAGNOSIS — Z86711 Personal history of pulmonary embolism: Secondary | ICD-10-CM | POA: Diagnosis not present

## 2024-08-05 ENCOUNTER — Inpatient Hospital Stay: Attending: Hematology

## 2024-08-05 VITALS — BP 123/54 | HR 70 | Temp 98.0°F | Resp 18

## 2024-08-05 DIAGNOSIS — E538 Deficiency of other specified B group vitamins: Secondary | ICD-10-CM | POA: Diagnosis not present

## 2024-08-05 MED ORDER — CYANOCOBALAMIN 1000 MCG/ML IJ SOLN
1000.0000 ug | Freq: Once | INTRAMUSCULAR | Status: AC
  Administered 2024-08-05 (×2): 1000 ug via INTRAMUSCULAR
  Filled 2024-08-05: qty 1

## 2024-08-05 NOTE — Patient Instructions (Signed)
 Vitamin B12 Injection What is this medication? Vitamin B12 (VAHY tuh min B12) prevents and treats low vitamin B12 levels in your body. It is used in people who do not get enough vitamin B12 from their diet or when their digestive tract does not absorb enough. Vitamin B12 plays an important role in maintaining the health of your nervous system and red blood cells. This medicine may be used for other purposes; ask your health care provider or pharmacist if you have questions. COMMON BRAND NAME(S): B-12 Compliance Kit, B-12 Injection Kit, Cyomin, Dodex , LA-12, Nutri-Twelve, Physicians EZ Use B-12, Primabalt, Vitamin Deficiency Injectable System - B12 What should I tell my care team before I take this medication? They need to know if you have any of these conditions: Kidney disease Leber's disease Megaloblastic anemia An unusual or allergic reaction to cyanocobalamin , cobalt, other medications, foods, dyes, or preservatives Pregnant or trying to get pregnant Breast-feeding How should I use this medication? This medication is injected into a muscle or deeply under the skin. It is usually given in a clinic or care team's office. However, your care team may teach you how to inject yourself. Follow all instructions. Talk to your care team about the use of this medication in children. Special care may be needed. Overdosage: If you think you have taken too much of this medicine contact a poison control center or emergency room at once. NOTE: This medicine is only for you. Do not share this medicine with others. What if I miss a dose? If you are given your dose at a clinic or care team's office, call to reschedule your appointment. If you give your own injections, and you miss a dose, take it as soon as you can. If it is almost time for your next dose, take only that dose. Do not take double or extra doses. What may interact with this medication? Alcohol Colchicine This list may not describe all possible  interactions. Give your health care provider a list of all the medicines, herbs, non-prescription drugs, or dietary supplements you use. Also tell them if you smoke, drink alcohol, or use illegal drugs. Some items may interact with your medicine. What should I watch for while using this medication? Visit your care team regularly. You may need blood work done while you are taking this medication. You may need to follow a special diet. Talk to your care team. Limit your alcohol intake and avoid smoking to get the best benefit. What side effects may I notice from receiving this medication? Side effects that you should report to your care team as soon as possible: Allergic reactions--skin rash, itching, hives, swelling of the face, lips, tongue, or throat Swelling of the ankles, hands, or feet Trouble breathing Side effects that usually do not require medical attention (report to your care team if they continue or are bothersome): Diarrhea This list may not describe all possible side effects. Call your doctor for medical advice about side effects. You may report side effects to FDA at 1-800-FDA-1088. Where should I keep my medication? Keep out of the reach of children. Store at room temperature between 15 and 30 degrees C (59 and 85 degrees F). Protect from light. Throw away any unused medication after the expiration date. NOTE: This sheet is a summary. It may not cover all possible information. If you have questions about this medicine, talk to your doctor, pharmacist, or health care provider.  2024 Elsevier/Gold Standard (2021-08-24 00:00:00)

## 2024-08-05 NOTE — Progress Notes (Signed)
 Patient tolerated B12 injection with no complaints voiced.  Site clean and dry with no bruising or swelling noted at site.  See MAR for details.  Band aid applied.  Patient stable during and after injection.  Vss with discharge and left in satisfactory condition with no s/s of distress noted. All follow ups as scheduled.   Natalie Castro

## 2024-08-07 ENCOUNTER — Telehealth: Payer: Self-pay

## 2024-08-07 NOTE — Telephone Encounter (Addendum)
 Alert received from [employer]:  Alert remote transmission: AF > burden 1 AMS x 1 d 13 hrs starting on 8/11.  Presents in AF on the 8/13 remote.  On OAC per EMR.  To triage due to presenting EGM in AF.   Outreach made to Pt.   Pt is on Eliquis  d/t history of PE's.  Historically has had episodes of atrial tachycardia, but no aflutter or afib.  Per Pt she has felt well.  Has not noticed any symptoms related to being out of rhythm.  She states she takes her Eliquis  as ordered.  Advised would schedule a repeat remote transmission in 7 days to reevaluate (Pt has scheduled remote in 7 days scheduled).  Advised would call Pt if any further concerns.  Will forward to Dr. Waddell as RICK.

## 2024-08-08 NOTE — Telephone Encounter (Signed)
 Continue eliquis . Lifelong at this point.

## 2024-08-14 ENCOUNTER — Ambulatory Visit (INDEPENDENT_AMBULATORY_CARE_PROVIDER_SITE_OTHER): Payer: Medicare Other

## 2024-08-14 DIAGNOSIS — I442 Atrioventricular block, complete: Secondary | ICD-10-CM | POA: Diagnosis not present

## 2024-08-14 LAB — CUP PACEART REMOTE DEVICE CHECK
Battery Remaining Longevity: 23 mo
Battery Remaining Percentage: 20 %
Battery Voltage: 2.92 V
Brady Statistic AP VP Percent: 59 %
Brady Statistic AP VS Percent: 1 %
Brady Statistic AS VP Percent: 41 %
Brady Statistic AS VS Percent: 1 %
Brady Statistic RA Percent Paced: 57 %
Brady Statistic RV Percent Paced: 99 %
Date Time Interrogation Session: 20250820020016
Implantable Lead Connection Status: 753985
Implantable Lead Connection Status: 753985
Implantable Lead Implant Date: 20161129
Implantable Lead Implant Date: 20161129
Implantable Lead Location: 753859
Implantable Lead Location: 753860
Implantable Lead Model: 5076
Implantable Pulse Generator Implant Date: 20161129
Lead Channel Impedance Value: 360 Ohm
Lead Channel Impedance Value: 550 Ohm
Lead Channel Pacing Threshold Amplitude: 0.75 V
Lead Channel Pacing Threshold Amplitude: 0.875 V
Lead Channel Pacing Threshold Pulse Width: 0.4 ms
Lead Channel Pacing Threshold Pulse Width: 0.4 ms
Lead Channel Sensing Intrinsic Amplitude: 12 mV
Lead Channel Sensing Intrinsic Amplitude: 3.5 mV
Lead Channel Setting Pacing Amplitude: 1.875
Lead Channel Setting Pacing Amplitude: 2 V
Lead Channel Setting Pacing Pulse Width: 0.4 ms
Lead Channel Setting Sensing Sensitivity: 4 mV
Pulse Gen Model: 2240
Pulse Gen Serial Number: 7826037

## 2024-08-14 NOTE — Telephone Encounter (Signed)
 PPM Scheduled remote reviewed. Normal device function.  Presenting rhythm: AF/AFL/VP, ongoing from 8/11 Pt asymptomatic per EPIC, on Eliquis  due to hx of PE's.    Called patient to make her aware that she continues in AF/flutter.  Unable to leave message on VM.

## 2024-08-15 NOTE — Telephone Encounter (Signed)
 Forwarding to Dr. Waddell as patient is still out of rhythm as of yesterdays transmission.  Previously asymptomatic.   Will reach out back out to the patient once Dr. Waddell reviews.

## 2024-08-18 ENCOUNTER — Ambulatory Visit: Payer: Self-pay | Admitting: Internal Medicine

## 2024-08-19 ENCOUNTER — Ambulatory Visit (INDEPENDENT_AMBULATORY_CARE_PROVIDER_SITE_OTHER): Admitting: Podiatry

## 2024-08-19 ENCOUNTER — Encounter: Payer: Self-pay | Admitting: Podiatry

## 2024-08-19 DIAGNOSIS — E119 Type 2 diabetes mellitus without complications: Secondary | ICD-10-CM | POA: Diagnosis not present

## 2024-08-19 DIAGNOSIS — B351 Tinea unguium: Secondary | ICD-10-CM | POA: Diagnosis not present

## 2024-08-19 DIAGNOSIS — M79675 Pain in left toe(s): Secondary | ICD-10-CM | POA: Diagnosis not present

## 2024-08-19 DIAGNOSIS — M79674 Pain in right toe(s): Secondary | ICD-10-CM

## 2024-08-19 NOTE — Progress Notes (Signed)
 This patient returns to my office for at risk foot care.  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 This patient is unable to cut nails herself since the patient cannot reach her nails.These nails are painful walking and wearing shoes.  This patient presents for at risk foot care today.  General Appearance  Alert, conversant and in no acute stress.  Vascular  Dorsalis pedis and posterior tibial  pulses are  weakly palpable  bilaterally.  Capillary return is within normal limits  bilaterally. Temperature is within normal limits  bilaterally.  Neurologic  Senn-Weinstein monofilament wire test within normal limits  bilaterally. Muscle power within normal limits bilaterally.  Nails Thick disfigured discolored nails with subungual debris  from hallux to fifth toes bilaterally. No evidence of bacterial infection or drainage bilaterally.  Orthopedic  No limitations of motion  feet .  No crepitus or effusions noted.  No bony pathology or digital deformities noted.  Skin  normotropic skin with no porokeratosis noted bilaterally.  No signs of infections or ulcers noted.     Onychomycosis  Pain in right toes  Pain in left toes  Consent was obtained for treatment procedures.   Mechanical debridement of nails 1-5  bilaterally performed with a nail nipper.  Filed with dremel without incident.    Return office visit    4 months                  Told patient to return for periodic foot care and evaluation due to potential at risk complications.   Helane Gunther DPM

## 2024-08-22 DIAGNOSIS — Z794 Long term (current) use of insulin: Secondary | ICD-10-CM | POA: Diagnosis not present

## 2024-08-22 DIAGNOSIS — I5030 Unspecified diastolic (congestive) heart failure: Secondary | ICD-10-CM | POA: Diagnosis not present

## 2024-08-22 DIAGNOSIS — I509 Heart failure, unspecified: Secondary | ICD-10-CM | POA: Diagnosis not present

## 2024-08-22 DIAGNOSIS — I110 Hypertensive heart disease with heart failure: Secondary | ICD-10-CM | POA: Diagnosis not present

## 2024-08-22 DIAGNOSIS — I10 Essential (primary) hypertension: Secondary | ICD-10-CM | POA: Diagnosis not present

## 2024-08-22 DIAGNOSIS — E1121 Type 2 diabetes mellitus with diabetic nephropathy: Secondary | ICD-10-CM | POA: Diagnosis not present

## 2024-09-05 ENCOUNTER — Inpatient Hospital Stay: Attending: Hematology

## 2024-09-05 VITALS — BP 133/55 | HR 70 | Temp 97.0°F | Resp 18

## 2024-09-05 DIAGNOSIS — E538 Deficiency of other specified B group vitamins: Secondary | ICD-10-CM | POA: Diagnosis not present

## 2024-09-05 MED ORDER — CYANOCOBALAMIN 1000 MCG/ML IJ SOLN
1000.0000 ug | Freq: Once | INTRAMUSCULAR | Status: AC
  Administered 2024-09-05: 1000 ug via INTRAMUSCULAR
  Filled 2024-09-05: qty 1

## 2024-09-05 NOTE — Patient Instructions (Signed)

## 2024-09-05 NOTE — Progress Notes (Signed)
 ROGUE PAUTLER presents today for B12 injection per the provider's orders.  Stable during administration without incident; injection site WNL; see MAR for injection details.  Patient tolerated procedure well and without incident.  No questions or complaints noted at this time.

## 2024-09-18 NOTE — Progress Notes (Signed)
 Remote PPM Transmission

## 2024-09-23 NOTE — Telephone Encounter (Signed)
 No change in treatment if asymptomatic.

## 2024-09-26 ENCOUNTER — Ambulatory Visit (INDEPENDENT_AMBULATORY_CARE_PROVIDER_SITE_OTHER): Admitting: Gastroenterology

## 2024-09-26 ENCOUNTER — Encounter (INDEPENDENT_AMBULATORY_CARE_PROVIDER_SITE_OTHER): Payer: Self-pay | Admitting: Gastroenterology

## 2024-09-26 VITALS — BP 114/74 | HR 70 | Temp 98.1°F | Ht 65.0 in | Wt 213.6 lb

## 2024-09-26 DIAGNOSIS — K529 Noninfective gastroenteritis and colitis, unspecified: Secondary | ICD-10-CM

## 2024-09-26 DIAGNOSIS — K909 Intestinal malabsorption, unspecified: Secondary | ICD-10-CM

## 2024-09-26 MED ORDER — COLESTIPOL HCL 1 G PO TABS: 1.0000 g | ORAL_TABLET | Freq: Two times a day (BID) | ORAL | 2 refills | Status: AC

## 2024-09-26 NOTE — Patient Instructions (Signed)
 You can stop questran  altogether We will start colestipol 1g twice daily to see how this helps with looser stools  Follow up 6 months  It was a pleasure to see you today. I want to create trusting relationships with patients and provide genuine, compassionate, and quality care. I truly value your feedback! please be on the lookout for a survey regarding your visit with me today. I appreciate your input about our visit and your time in completing this!    Tangy Drozdowski L. Tobi Leinweber, MSN, APRN, AGNP-C Adult-Gerontology Nurse Practitioner [facility] at [facility]

## 2024-09-26 NOTE — Progress Notes (Signed)
 Referring Provider: Shona Norleen PEDLAR, MD Primary Care Physician:  Shona Norleen PEDLAR, MD Primary GI Physician: Dr. Cinderella   Chief Complaint  Patient presents with   Follow-up    Pt arrives for follow up. Pt states she has had some diarrhea but not as much as she was. Pt was able to complete Vancomycin  regimen. Would like to know if we can give her Questran  in pill from.    HPI:   Natalie Castro is a 83 y.o. 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 Cinderella at that time, reported diarrhea since GB removal in 2023, 6-7 per day  Recommended starting metamucil, C diff, GI pathogen panel, fecal calprotectin,ab xray, cholestyramine , creon given   Labs on 6/5 positive for C diff toxin, GI pathogen panel and Calprotectin negative Patient prescribed vanc QID x10 days  Present:  Reports doing much better since vancomycin  therapy. Having 1 stool per day, usually formed. States occasionally she has looser stools but not often. She reports she cannot tolerate taking the questran  due to the consistency. She has some occasional abdominal pain, usually mid lower abdomen. Knows she cannot eat more greasy foods as these tend to bother her stomach and give her looser stools. She notes that defecation usually improves her pain. No rectal bleeding or melena. No nausea or vomiting. Appetite is good. No weight loss. Does not think she tried metamucil, daughter states she does not do well with anything she has to mix and drink.    Last ZHI:wnwz Last Colonoscopy:2019 - Four 2 to 4 mm polyps in the distal transverse colon, at the hepatic flexure( 2) and in the ascending colon, removed with a cold snare. Resected and retrieved. - Redundant LEFT colon. - External and internal hemorrhoids.  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 Kelsie 11/24/15   Dyspnea    Essential hypertension    History of pulmonary embolism 2017   unprovoked, long term anticoag with apixaban     Hyperlipidemia    Myoview  03/2021    Myoview  4/22: EF 50, no infarct or ischemia; low risk   Presence of permanent cardiac pacemaker    Type 2 diabetes mellitus (HCC)     Past Surgical History:  Procedure Laterality Date   BREAST BIOPSY Right 05/03/2022   DUCT ECTASIA WITH FOCAL PAPILLARY  EPITHELIAL HYPERPLASIA WITHOUT CYTOLOGIC ATYPIA-NEGATIVE FOR  MALIGNANCY   BREAST LUMPECTOMY Right 06/22/2022   CHOLECYSTECTOMY N/A 03/16/2022   Procedure: LAPAROSCOPIC CHOLECYSTECTOMY;  Surgeon: Kallie Manuelita BROCKS, MD;  Location: [facility];  Service: General;  Laterality: N/A;   COLONOSCOPY     3 SIMPLE ADENOMAS, AGE 63   COLONOSCOPY N/A 05/24/2018   Procedure: COLONOSCOPY;  Surgeon: Harvey Margo CROME, MD;  Location: [facility];  Service: Endoscopy;  Laterality: N/A;  1:00pm   ENDOSCOPIC RETROGRADE CHOLANGIOPANCREATOGRAPHY (ERCP) WITH PROPOFOL  N/A 03/15/2022   Procedure: ENDOSCOPIC RETROGRADE CHOLANGIOPANCREATOGRAPHY (ERCP) WITH PROPOFOL ;  Surgeon: Golda Claudis PENNER, MD;  Location: [facility];  Service: Endoscopy;  Laterality: N/A;  with sphincterotomy and stone retrieval   EP IMPLANTABLE DEVICE  N/A 11/24/2015   Procedure: Pacemaker Implant;  Surgeon: Lynwood Rakers, MD;  Location: [facility] INVASIVE CV LAB;  Service: Cardiovascular;  Laterality: N/A;   IR EXCHANGE BILIARY DRAIN  07/27/2021   IR EXCHANGE BILIARY DRAIN  08/23/2021   IR EXCHANGE BILIARY DRAIN  02/25/2022   IR PERC CHOLECYSTOSTOMY  07/08/2021   IR PERC CHOLECYSTOSTOMY  11/04/2021   IR RADIOLOGIST EVAL & MGMT  09/22/2021   JOINT REPLACEMENT     knees bilat.   LEFT HEART CATH AND CORONARY ANGIOGRAPHY N/A 07/02/2021   Procedure: LEFT  HEART CATH AND CORONARY ANGIOGRAPHY;  Surgeon: Dann Candyce RAMAN, MD;  Location: [facility] INVASIVE CV LAB;  Service: Cardiovascular;  Laterality: N/A;   POLYPECTOMY  05/24/2018   Procedure: POLYPECTOMY;  Surgeon: Harvey Margo CROME, MD;  Location: [facility];  Service: Endoscopy;;  ascending and hepatic flexure, transverse   REMOVAL OF STONES N/A 03/15/2022   Procedure: STONE AND SLUDGE EXTRACTION;  Surgeon: Golda Claudis PENNER, MD;  Location: [facility];  Service: Endoscopy;  Laterality: N/A;   TEE WITHOUT CARDIOVERSION N/A 08/27/2021   Procedure: TRANSESOPHAGEAL ECHOCARDIOGRAM (TEE);  Surgeon: Mona Vinie BROCKS, MD;  Location[facility] ENDOSCOPY;  Service: Cardiovascular;  Laterality: N/A;   TUBAL LIGATION      Current Outpatient Medications  Medication Sig Dispense Refill   anastrozole  (ARIMIDEX ) 1 MG tablet TAKE 1 TABLET(1 MG) BY MOUTH DAILY 90 tablet 3   apixaban  (ELIQUIS ) 5 MG TABS tablet Take 1 tablet (5 mg total) by mouth 2 (two) times daily. 60 tablet 0   atorvastatin  (LIPITOR) 40 MG tablet Take 1 tablet (40 mg total) by mouth daily. 30 tablet 0   benzonatate  (TESSALON ) 100 MG capsule Take 1 capsule by mouth every 8 (eight) hours for cough. 21 capsule 0   cholestyramine  (QUESTRAN ) 4 g packet Take 1 packet (4 g total) by mouth daily. Mix with 4-6 oz liquid.  Take other meds 1 hr before or 4-6 hr after cholestyramine . 30 packet 5   ferrous sulfate  325 (65 FE) MG EC tablet Take 1 tablet (325 mg total) by mouth daily with breakfast. 90 tablet 3   furosemide  (LASIX ) 40 MG tablet Take 1 tablet (40 mg) daily Monday, Wednesday, Friday. Take 2 tablets (80 mg total) all other days. 140 tablet 3   glucose blood test strip Use to check blood glucose fasting , before lunch and dinner and after your largest meal 120 each 0   Insulin  Pen Needle (BD AUTOSHIELD DUO) 30G X 5 MM MISC 30 gauge x 3/16 (pen needle,diabetic dual safty)     Lancets (ONETOUCH ULTRASOFT) lancets Use to check glucose 4 x daily 120 each 0    LANTUS  SOLOSTAR 100 UNIT/ML Solostar Pen Inject 10 Units into the skin at bedtime. (Patient taking differently: Inject 25 Units into the skin at bedtime.) 15 mL 0   losartan  (COZAAR ) 100 MG tablet Take 100 mg by mouth daily.     metFORMIN  (GLUCOPHAGE ) 1000 MG tablet TAKE (1) TABLET BY MOUTH TWICE DAILY. 60 tablet 0   metoprolol  succinate (TOPROL -XL) 50 MG 24 hr tablet TAKE 1 TABLET(50 MG) BY MOUTH IN THE MORNING AND AT BEDTIME WITH OR IMMEDIATELY FOLLOWING A MEAL 180 tablet 2   potassium chloride  (KLOR-CON ) 10 MEQ tablet Take 10 mEq by mouth daily.     No current facility-administered medications for this visit.    Allergies as of 09/26/2024 - Review Complete 09/26/2024  Allergen Reaction Noted   Penicillins Hives, Itching, and Other (See Comments)  Social History   Socioeconomic History   Marital status: Widowed    Spouse name: Not on file   Number of children: Not on file   Years of education: Not on file   Highest education level: Not on file  Occupational History   Occupation: retired  Tobacco Use   Smoking status: Never   Smokeless tobacco: Never  Vaping Use   Vaping status: Never Used  Substance and Sexual Activity   Alcohol use: No    Alcohol/week: 0.0 standard drinks of alcohol   Drug use: No   Sexual activity: Yes    Birth control/protection: Post-menopausal  Other Topics Concern   Not on file  Social History Narrative   MARRIED FOR 7 YRS. 5 KIDS: #4 PRESENT TODAY(AGE 30)   Social Drivers of Corporate investment banker Strain: Not on file  Food Insecurity: Not on file  Transportation Needs: Not on file  Physical Activity: Not on file  Stress: Not on file  Social Connections: Not on file    Review of systems General: negative for malaise, night sweats, fever, chills, weight loss Neck: Negative for lumps, goiter, pain and significant neck swelling Resp: Negative for cough, wheezing, dyspnea at rest CV: Negative for chest pain, leg swelling,  palpitations, orthopnea GI: denies melena, hematochezia, nausea, vomiting, diarrhea, constipation, dysphagia, odyonophagia, early satiety or unintentional weight loss. +loose stools +occasional abdominal discomfort  MSK: Negative for joint pain or swelling, back pain, and muscle pain. Derm: Negative for itching or rash Psych: Denies depression, anxiety, memory loss, confusion. No homicidal or suicidal ideation.  Heme: Negative for prolonged bleeding, bruising easily, and swollen nodes. Endocrine: Negative for cold or heat intolerance, polyuria, polydipsia and goiter. Neuro: negative for tremor, gait imbalance, syncope and seizures. The remainder of the review of systems is noncontributory.  Physical Exam: There were no vitals taken for this visit. General:   Alert and oriented. No distress noted. Pleasant and cooperative.  Head:  Normocephalic and atraumatic. Eyes:  Conjuctiva clear without scleral icterus. Mouth:  Oral mucosa pink and moist. Good dentition. No lesions. Heart: Normal rate and rhythm, s1 and s2 heart sounds present.  Lungs: Clear lung sounds in all lobes. Respirations equal and unlabored. Abdomen:  +BS, soft, non-tender and non-distended. No rebound or guarding. No HSM or masses noted. Derm: No palmar erythema or jaundice Msk:  Symmetrical without gross deformities. Normal posture. Extremities:  Without edema. Neurologic:  Alert and  oriented x4 Psych:  Alert and cooperative. Normal mood and affect.  Invalid input(s): 6 MONTHS   ASSESSMENT: JAZZLENE HUOT is a 83 y.o. female presenting today for follow up of chronic diarrhea and recent C diff  Labs in June positive for C diff, treated with 10 day course of vancomycin  with resolution. She is back to baseline with intermittent looser stools though notes she typically has 1 formed stool per day. She did not tolerate questran  due to consistence once mixed. We can try low dose colestipol in place of this, as I suspect there  is some aspect of bile acid malabsorption given looser stools began after GB removal a few years ago. She will make me aware if she has any issues with this.   PLAN:  -start colestipol 1g BID -avoid trigger foods   All questions were answered, patient verbalized understanding and is in agreement with plan as outlined above.   Follow Up: 6 months   Raynelle Fujikawa L. Randa Riss, MSN, APRN, AGNP-C Adult-Gerontology Nurse Practitioner [facility]

## 2024-09-27 NOTE — Telephone Encounter (Signed)
 Message sent to scheduling.  Pt due for yearly follow up.

## 2024-09-30 ENCOUNTER — Other Ambulatory Visit: Payer: Self-pay

## 2024-09-30 ENCOUNTER — Other Ambulatory Visit (HOSPITAL_COMMUNITY): Payer: Self-pay

## 2024-09-30 DIAGNOSIS — I442 Atrioventricular block, complete: Secondary | ICD-10-CM

## 2024-10-02 ENCOUNTER — Other Ambulatory Visit: Payer: Self-pay | Admitting: Internal Medicine

## 2024-10-02 DIAGNOSIS — I442 Atrioventricular block, complete: Secondary | ICD-10-CM

## 2024-10-02 MED ORDER — METOPROLOL SUCCINATE ER 50 MG PO TB24: 50.0000 mg | ORAL_TABLET | Freq: Two times a day (BID) | ORAL | 0 refills | Status: DC

## 2024-10-04 ENCOUNTER — Inpatient Hospital Stay: Attending: Hematology

## 2024-10-04 VITALS — BP 132/67 | HR 69 | Temp 97.0°F | Resp 20

## 2024-10-04 DIAGNOSIS — E538 Deficiency of other specified B group vitamins: Secondary | ICD-10-CM | POA: Insufficient documentation

## 2024-10-04 MED ORDER — CYANOCOBALAMIN 1000 MCG/ML IJ SOLN
1000.0000 ug | Freq: Once | INTRAMUSCULAR | Status: AC
  Administered 2024-10-04: 1000 ug via INTRAMUSCULAR
  Filled 2024-10-04: qty 1

## 2024-10-04 NOTE — Progress Notes (Signed)
 Patient tolerated injection with no complaints voiced.  Site clean and dry with no bruising or swelling noted at site.  See MAR for details.  Band aid applied.  Patient stable during and after injection.  Vss with discharge and left in satisfactory condition with no s/s of distress noted.

## 2024-10-04 NOTE — Patient Instructions (Signed)

## 2024-11-04 ENCOUNTER — Inpatient Hospital Stay: Attending: Hematology

## 2024-11-04 VITALS — BP 129/82 | HR 69 | Resp 19

## 2024-11-04 DIAGNOSIS — E538 Deficiency of other specified B group vitamins: Secondary | ICD-10-CM | POA: Diagnosis present

## 2024-11-04 MED ORDER — CYANOCOBALAMIN 1000 MCG/ML IJ SOLN
1000.0000 ug | Freq: Once | INTRAMUSCULAR | Status: AC
  Administered 2024-11-04: 1000 ug via INTRAMUSCULAR
  Filled 2024-11-04: qty 1

## 2024-11-04 NOTE — Patient Instructions (Signed)
 Vitamin B12 Injection What is this medication? Vitamin B12 (VAHY tuh min B12) prevents and treats low vitamin B12 levels in your body. It is used in people who do not get enough vitamin B12 from their diet or when their digestive tract does not absorb enough. Vitamin B12 plays an important role in maintaining the health of your nervous system and red blood cells. This medicine may be used for other purposes; ask your health care provider or pharmacist if you have questions. COMMON BRAND NAME(S): B-12 Compliance Kit, B-12 Injection Kit, Cyomin, Dodex , LA-12, Nutri-Twelve, Physicians EZ Use B-12, Primabalt, Vitamin Deficiency Injectable System - B12 What should I tell my care team before I take this medication? They need to know if you have any of these conditions: Kidney disease Leber's disease Megaloblastic anemia An unusual or allergic reaction to cyanocobalamin , cobalt, other medications, foods, dyes, or preservatives Pregnant or trying to get pregnant Breast-feeding How should I use this medication? This medication is injected into a muscle or deeply under the skin. It is usually given in a clinic or care team's office. However, your care team may teach you how to inject yourself. Follow all instructions. Talk to your care team about the use of this medication in children. Special care may be needed. Overdosage: If you think you have taken too much of this medicine contact a poison control center or emergency room at once. NOTE: This medicine is only for you. Do not share this medicine with others. What if I miss a dose? If you are given your dose at a clinic or care team's office, call to reschedule your appointment. If you give your own injections, and you miss a dose, take it as soon as you can. If it is almost time for your next dose, take only that dose. Do not take double or extra doses. What may interact with this medication? Alcohol Colchicine This list may not describe all possible  interactions. Give your health care provider a list of all the medicines, herbs, non-prescription drugs, or dietary supplements you use. Also tell them if you smoke, drink alcohol, or use illegal drugs. Some items may interact with your medicine. What should I watch for while using this medication? Visit your care team regularly. You may need blood work done while you are taking this medication. You may need to follow a special diet. Talk to your care team. Limit your alcohol intake and avoid smoking to get the best benefit. What side effects may I notice from receiving this medication? Side effects that you should report to your care team as soon as possible: Allergic reactions--skin rash, itching, hives, swelling of the face, lips, tongue, or throat Swelling of the ankles, hands, or feet Trouble breathing Side effects that usually do not require medical attention (report to your care team if they continue or are bothersome): Diarrhea This list may not describe all possible side effects. Call your doctor for medical advice about side effects. You may report side effects to FDA at 1-800-FDA-1088. Where should I keep my medication? Keep out of the reach of children. Store at room temperature between 15 and 30 degrees C (59 and 85 degrees F). Protect from light. Throw away any unused medication after the expiration date. NOTE: This sheet is a summary. It may not cover all possible information. If you have questions about this medicine, talk to your doctor, pharmacist, or health care provider.  2024 Elsevier/Gold Standard (2021-08-24 00:00:00)

## 2024-11-04 NOTE — Progress Notes (Signed)
 Patient tolerated B12 injection with no complaints voiced.  Site clean and dry with no bruising or swelling noted at site.  See MAR for details.  Band aid applied.  Patient stable during and after injection.  Vss with discharge and left in satisfactory condition with no s/s of distress noted. All follow ps as scheduled.  Jonelle Bann

## 2024-11-07 ENCOUNTER — Other Ambulatory Visit: Payer: Self-pay | Admitting: Internal Medicine

## 2024-11-07 DIAGNOSIS — I442 Atrioventricular block, complete: Secondary | ICD-10-CM

## 2024-11-13 ENCOUNTER — Ambulatory Visit: Payer: Medicare Other

## 2024-11-13 DIAGNOSIS — I442 Atrioventricular block, complete: Secondary | ICD-10-CM | POA: Diagnosis not present

## 2024-11-13 LAB — CUP PACEART REMOTE DEVICE CHECK
Battery Remaining Longevity: 18 mo
Battery Remaining Percentage: 17 %
Battery Voltage: 2.89 V
Brady Statistic AP VP Percent: 59 %
Brady Statistic AP VS Percent: 1 %
Brady Statistic AS VP Percent: 41 %
Brady Statistic AS VS Percent: 1 %
Brady Statistic RA Percent Paced: 44 %
Brady Statistic RV Percent Paced: 99 %
Date Time Interrogation Session: 20251119040019
Implantable Lead Connection Status: 753985
Implantable Lead Connection Status: 753985
Implantable Lead Implant Date: 20161129
Implantable Lead Implant Date: 20161129
Implantable Lead Location: 753859
Implantable Lead Location: 753860
Implantable Lead Model: 5076
Implantable Pulse Generator Implant Date: 20161129
Lead Channel Impedance Value: 340 Ohm
Lead Channel Impedance Value: 430 Ohm
Lead Channel Pacing Threshold Amplitude: 0.75 V
Lead Channel Pacing Threshold Amplitude: 0.875 V
Lead Channel Pacing Threshold Pulse Width: 0.4 ms
Lead Channel Pacing Threshold Pulse Width: 0.4 ms
Lead Channel Sensing Intrinsic Amplitude: 3.5 mV
Lead Channel Sensing Intrinsic Amplitude: 6 mV
Lead Channel Setting Pacing Amplitude: 1.875
Lead Channel Setting Pacing Amplitude: 2 V
Lead Channel Setting Pacing Pulse Width: 0.4 ms
Lead Channel Setting Sensing Sensitivity: 4 mV
Pulse Gen Model: 2240
Pulse Gen Serial Number: 7826037

## 2024-11-14 ENCOUNTER — Ambulatory Visit: Payer: Self-pay | Admitting: Internal Medicine

## 2024-11-15 NOTE — Progress Notes (Signed)
 Remote PPM Transmission

## 2024-12-04 ENCOUNTER — Inpatient Hospital Stay

## 2024-12-04 ENCOUNTER — Inpatient Hospital Stay: Attending: Hematology

## 2024-12-04 VITALS — BP 125/80 | HR 71 | Temp 97.6°F | Resp 18

## 2024-12-04 DIAGNOSIS — M85832 Other specified disorders of bone density and structure, left forearm: Secondary | ICD-10-CM | POA: Insufficient documentation

## 2024-12-04 DIAGNOSIS — N1831 Chronic kidney disease, stage 3a: Secondary | ICD-10-CM | POA: Diagnosis not present

## 2024-12-04 DIAGNOSIS — D0511 Intraductal carcinoma in situ of right breast: Secondary | ICD-10-CM | POA: Diagnosis present

## 2024-12-04 DIAGNOSIS — E538 Deficiency of other specified B group vitamins: Secondary | ICD-10-CM | POA: Diagnosis present

## 2024-12-04 DIAGNOSIS — Z79811 Long term (current) use of aromatase inhibitors: Secondary | ICD-10-CM | POA: Insufficient documentation

## 2024-12-04 DIAGNOSIS — D631 Anemia in chronic kidney disease: Secondary | ICD-10-CM | POA: Insufficient documentation

## 2024-12-04 DIAGNOSIS — Z170 Estrogen receptor positive status [ER+]: Secondary | ICD-10-CM | POA: Insufficient documentation

## 2024-12-04 DIAGNOSIS — D649 Anemia, unspecified: Secondary | ICD-10-CM

## 2024-12-04 DIAGNOSIS — D508 Other iron deficiency anemias: Secondary | ICD-10-CM

## 2024-12-04 DIAGNOSIS — D0581 Other specified type of carcinoma in situ of right breast: Secondary | ICD-10-CM

## 2024-12-04 LAB — COMPREHENSIVE METABOLIC PANEL WITH GFR
ALT: 30 U/L (ref 0–44)
AST: 31 U/L (ref 15–41)
Albumin: 3.7 g/dL (ref 3.5–5.0)
Alkaline Phosphatase: 80 U/L (ref 38–126)
Anion gap: 15 (ref 5–15)
BUN: 17 mg/dL (ref 8–23)
CO2: 21 mmol/L — ABNORMAL LOW (ref 22–32)
Calcium: 8.9 mg/dL (ref 8.9–10.3)
Chloride: 105 mmol/L (ref 98–111)
Creatinine, Ser: 1.07 mg/dL — ABNORMAL HIGH (ref 0.44–1.00)
GFR, Estimated: 52 mL/min — ABNORMAL LOW (ref >60)
Glucose, Bld: 257 mg/dL — ABNORMAL HIGH (ref 70–99)
Potassium: 4.1 mmol/L (ref 3.5–5.1)
Sodium: 141 mmol/L (ref 135–145)
Total Bilirubin: 0.5 mg/dL (ref 0.0–1.2)
Total Protein: 6.8 g/dL (ref 6.5–8.1)

## 2024-12-04 LAB — CBC WITH DIFFERENTIAL/PLATELET
Abs Immature Granulocytes: 0.06 K/uL (ref 0.00–0.07)
Basophils Absolute: 0 K/uL (ref 0.0–0.1)
Basophils Relative: 0 %
Eosinophils Absolute: 0.1 K/uL (ref 0.0–0.5)
Eosinophils Relative: 2 %
HCT: 38.1 % (ref 36.0–46.0)
Hemoglobin: 11.8 g/dL — ABNORMAL LOW (ref 12.0–15.0)
Immature Granulocytes: 1 %
Lymphocytes Relative: 28 %
Lymphs Abs: 2.1 K/uL (ref 0.7–4.0)
MCH: 28.6 pg (ref 26.0–34.0)
MCHC: 31 g/dL (ref 30.0–36.0)
MCV: 92.3 fL (ref 80.0–100.0)
Monocytes Absolute: 0.7 K/uL (ref 0.1–1.0)
Monocytes Relative: 9 %
Neutro Abs: 4.5 K/uL (ref 1.7–7.7)
Neutrophils Relative %: 60 %
Platelets: 226 K/uL (ref 150–400)
RBC: 4.13 MIL/uL (ref 3.87–5.11)
RDW: 13.9 % (ref 11.5–15.5)
WBC: 7.5 K/uL (ref 4.0–10.5)
nRBC: 0 % (ref 0.0–0.2)

## 2024-12-04 LAB — IRON AND TIBC
Iron: 40 ug/dL (ref 28–170)
Saturation Ratios: 17 % (ref 10.4–31.8)
TIBC: 245 ug/dL — ABNORMAL LOW (ref 250–450)
UIBC: 205 ug/dL

## 2024-12-04 LAB — FERRITIN: Ferritin: 102 ng/mL (ref 11–307)

## 2024-12-04 LAB — VITAMIN D 25 HYDROXY (VIT D DEFICIENCY, FRACTURES): Vit D, 25-Hydroxy: 66.23 ng/mL (ref 30–100)

## 2024-12-04 MED ORDER — CYANOCOBALAMIN 1000 MCG/ML IJ SOLN
1000.0000 ug | Freq: Once | INTRAMUSCULAR | Status: AC
  Administered 2024-12-04: 1000 ug via INTRAMUSCULAR
  Filled 2024-12-04: qty 1

## 2024-12-04 NOTE — Progress Notes (Signed)
 Natalie Castro presents today for injection per the provider's orders.  B12 administration without incident; injection site WNL; see MAR for injection details.  Patient tolerated procedure well and without incident.  No questions or complaints noted at this time.   Treatment given today per MD orders. Tolerated infusion without adverse affects. Vital signs stable. No complaints at this time. Discharged from clinic via wheelchair in stable condition. Alert and oriented x 3. F/U with [facility] as scheduled.

## 2024-12-04 NOTE — Patient Instructions (Signed)
 [facility] [place] - [facility]  Discharge Instructions: Thank you for choosing [facility] to provide your oncology and hematology care.  If you have a lab appointment with the Cancer Center - please note that after April 8th, 2024, all labs will be drawn in the cancer center.  You do not have to check in or register with the main entrance as you have in the past but will complete your check-in in the cancer center.  Wear comfortable clothing and clothing appropriate for easy access to any Portacath or PICC line.   We strive to give you quality time with your provider. You may need to reschedule your appointment if you arrive late (15 or more minutes).  Arriving late affects you and other patients whose appointments are after yours.  Also, if you miss three or more appointments without notifying the office, you may be dismissed from the clinic at the provider's discretion.      For prescription refill requests, have your pharmacy contact our office and allow 72 hours for refills to be completed.    Today you received B12 injection     BELOW ARE SYMPTOMS THAT SHOULD BE REPORTED IMMEDIATELY: *FEVER GREATER THAN 100.4 F (38 C) OR HIGHER *CHILLS OR SWEATING *NAUSEA AND VOMITING THAT IS NOT CONTROLLED WITH YOUR NAUSEA MEDICATION *UNUSUAL SHORTNESS OF BREATH *UNUSUAL BRUISING OR BLEEDING *URINARY PROBLEMS (pain or burning when urinating, or frequent urination) *BOWEL PROBLEMS (unusual diarrhea, constipation, pain near the anus) TENDERNESS IN MOUTH AND THROAT WITH OR WITHOUT PRESENCE OF ULCERS (sore throat, sores in mouth, or a toothache) UNUSUAL RASH, SWELLING OR PAIN  UNUSUAL VAGINAL DISCHARGE OR ITCHING   Items with * indicate a potential emergency and should be followed up as soon as possible or go to the Emergency Department if any problems should occur.  Please show the CHEMOTHERAPY ALERT CARD or IMMUNOTHERAPY ALERT CARD at check-in to  the Emergency Department and triage nurse.  Should you have questions after your visit or need to cancel or reschedule your appointment, please contact [facility] [place] - A DEPT OF [facility] [phone]  and follow the prompts.  Office hours are 8:00 a.m. to 4:30 p.m. Monday - Friday. Please note that voicemails left after 4:00 p.m. may not be returned until the following business day.  We are closed weekends and major holidays. You have access to a nurse at all times for urgent questions. Please call the main number to the clinic [phone] and follow the prompts.  For any non-urgent questions, you may also contact your provider using MyChart. We now offer e-Visits for anyone 57 and older to request care online for non-urgent symptoms. For details visit [email or web address].   Also download the MyChart app! Go to the app store, search "MyChart", open the app, select [place], and log in with your MyChart username and password.

## 2024-12-06 ENCOUNTER — Other Ambulatory Visit (HOSPITAL_COMMUNITY): Payer: Self-pay | Admitting: Nurse Practitioner

## 2024-12-06 DIAGNOSIS — R0609 Other forms of dyspnea: Secondary | ICD-10-CM

## 2024-12-09 ENCOUNTER — Ambulatory Visit (HOSPITAL_COMMUNITY)
Admission: RE | Admit: 2024-12-09 | Discharge: 2024-12-09 | Attending: Nurse Practitioner | Admitting: Nurse Practitioner

## 2024-12-09 DIAGNOSIS — Z950 Presence of cardiac pacemaker: Secondary | ICD-10-CM | POA: Diagnosis not present

## 2024-12-09 DIAGNOSIS — R0602 Shortness of breath: Secondary | ICD-10-CM | POA: Diagnosis not present

## 2024-12-09 DIAGNOSIS — E785 Hyperlipidemia, unspecified: Secondary | ICD-10-CM | POA: Diagnosis not present

## 2024-12-09 DIAGNOSIS — I083 Combined rheumatic disorders of mitral, aortic and tricuspid valves: Secondary | ICD-10-CM | POA: Diagnosis not present

## 2024-12-09 DIAGNOSIS — I110 Hypertensive heart disease with heart failure: Secondary | ICD-10-CM | POA: Diagnosis not present

## 2024-12-09 DIAGNOSIS — I509 Heart failure, unspecified: Secondary | ICD-10-CM | POA: Diagnosis not present

## 2024-12-09 DIAGNOSIS — R0609 Other forms of dyspnea: Secondary | ICD-10-CM | POA: Diagnosis not present

## 2024-12-09 DIAGNOSIS — E119 Type 2 diabetes mellitus without complications: Secondary | ICD-10-CM | POA: Diagnosis not present

## 2024-12-09 DIAGNOSIS — R0600 Dyspnea, unspecified: Secondary | ICD-10-CM | POA: Diagnosis present

## 2024-12-09 LAB — ECHOCARDIOGRAM COMPLETE
Area-P 1/2: 3.3 cm2
Calc EF: 52.1 %
MV M vel: 5.07 m/s
MV Peak grad: 102.8 mmHg
MV VTI: 2.23 cm2
Radius: 0.4 cm
S' Lateral: 3.5 cm
Single Plane A2C EF: 55.2 %
Single Plane A4C EF: 49.8 %

## 2024-12-09 NOTE — Progress Notes (Unsigned)
 [facility] [address] [place], [place] [address]   CLINIC:  Medical Oncology/Hematology  PCP:  Shona Norleen PEDLAR, MD 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, a 83 y.o. female, returns for routine follow-up of her right breast DCIS and B12 deficiency. Areyana was last seen  by Pleasant Barefoot PA-C on 06/04/2024.  At today's visit, she  reports feeling ***. She denies any recent hospitalizations, surgeries, or changes in her  baseline health status.*** She reports 25***% energy and 25***% appetite.   ***She is maintaining stable weight at this time.  RIGHT BREAST DCIS:  She denies any new breast lumps or axillary lymphadenopathy.*** ***No new onset aches/pains, headaches, or abdominal pain. ***She continues to take daily Arimidex  and reports mild hot flashes without any other adverse effects.  ANEMIA:  She continues to receive monthly B12 injections and take daily  iron tablet.*** She has occasional scant rectal bleeding when wiping, but denies any gross hematochezia or melena.*** She has low energy that waxes and wanes.*** She denies any ice pica.*** ***She has chronic dyspnea on exertion and orthopnea in the setting of CHF.   ASSESSMENT & PLAN:  1.  Right breast DCIS: - Mammogram (04/21/2022): Highly suspicious 2.4 cm mass in the right breast at 6:00.  0.6 cm mass and a dilated duct in the lateral areola right breast at 6:00.  No evidence of axillary adenopathy.  No evidence of left breast malignancy. - Biopsy (04/26/2022): Right breast subareolar biopsy with marked duct ectasia with focal papillary epithelial hyperplasia without cytological atypia.  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  Grade 1.  No evidence of invasion.  ER 100%, PR 100%, Ki-67 10%, HER2 2+ by IHC, negative by FISH. - Lumpectomy (06/22/2022): 3.1 x 2.2 x 1.2 cm DCIS, grade 2, intracystic papillary carcinoma with rare minute foci of microinvasion, margins negative.  ER/PR 100%.  HER2 2+ by IHC and negative by FISH.  Ki-67 10%.  pTis PNX. - Anastrozole  started on 07/04/2022.  Goal of treatment is 5 years. - She was reluctant to consider radiation. - Diagnostic mammogram (05/23/2023): BI-RADS Category 3, probably benign findings including new lumpectomy changes in right breast.  Asymmetry in upper central to outer right breast felt to be related to postsurgical change.  No obvious mammographic evidence of malignancy. - Repeat unilateral (3-month) diagnostic mammogram (12/14/2023): BI-RADS Category 3, probably benign.  Stable right breast asymmetry felt to be related to the  lumpectomy changes.  Radiologist gave recommendations for bilateral diagnostic mammography in 6 months. - Most recent bilateral diagnostic mammogram (06/27/2024): BI-RADS Category 2, benign.  Previously seen asymmetry in upper right breast is nearly completely  resolved, consistent with benign postsurgical changes. - Physical exam (***): Postsurgical nipple inversion and lumpectomy scar lateral border of right nipple.  No discrete nodules, masses, or lymphadenopathy. - Tolerating anastrozole  well outside of mild hot flashes *** - No red flag symptoms per history/ROS today *** - Most recent labs (12/04/2024): Normal LFTs. - PLAN: Bilateral screening mammogram due July 2026 (to be ordered after next visit) - Continue anastrozole  daily. - RTC in 6 months with repeat labs and office visit    2.  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  FRAX with 5.3% risk of major osteoporotic fracture and 1.2% risk of hip fracture in the next 10 years. - Most recent labs (12/04/2024) with normal vitamin D66.23, calcium  8.9. - She is taking vitamin D  4000 units daily*** - PLAN: Continue vitamin D  - No strong indication for starting treatment of osteopenia at this time, but we will repeat DEXA scan in July 2027 and consider starting Prolia at that time if T-score has dropped to <-2.0.  3.  Mild normocytic anemia # Vitamin B12 deficiency # Iron deficiency # Anemia of CKD stage IIIa - Labs from 11/01/2023 show Hgb 11.9/MCV 93.0.  Ferritin 37, iron saturation 30%.  Vitamin B12 significantly low at 61.  Normal folate.  Creatinine 1.02/GFR 55 - Failed to improve on oral B12 repletion.  Monthly B12 injections since November 2024.*** - Taking ferrous sulfate  325 mg daily since November 2024.  *** - Most recent labs (12/04/2024): Hgb 11.8/MCV 92.3, ferritin 102, iron saturation 17% with low TIBC 245.  - No rectal bleeding or melena*** - Symptomatic with fatigue*** - PLAN: Continue monthly B12 injections. - Continue ferrous sulfate  325 mg daily. - No indication for IV iron. - Recheck iron and B12 at follow-up.  4.  Social/family history: -  She lives at home with her daughter.  Uses rollator for ambulation secondary to arthritis.  She is able to do all her ADLs.  [place] and a [employer] and also did some farm work prior to retirement.  Non-smoker. - Sister had metastatic breast cancer in her 68s.   PLAN SUMMARY: >> Continue monthly B12 injections >> Labs in 6 months = CBC/D, CMP, vitamin D , ferritin, iron/TIBC, B12, MMA >> OFFICE visit in 6 months (1 week after labs) ***   REVIEW OF SYSTEMS: ***  Review of Systems  Constitutional:  Positive for fatigue. Negative for appetite change, chills, diaphoresis, fever and unexpected weight change.  HENT:   Negative for lump/mass and nosebleeds.   Eyes:  Negative for eye problems.  Respiratory:  Positive for cough and shortness of breath (with exertion). Negative for hemoptysis.   Cardiovascular:  Negative for chest pain, leg swelling and palpitations.  Gastrointestinal:  Positive for diarrhea (improving). Negative for abdominal pain, blood in stool, constipation, nausea and vomiting.  Genitourinary:  Negative for hematuria.   Skin: Negative.   Neurological:  Positive for dizziness, headaches and numbness. Negative for light-headedness.  Hematological:  Does not bruise/bleed easily.  Psychiatric/Behavioral:  Positive for sleep disturbance.     PHYSICAL EXAM: ***  Performance status (ECOG): 2 - Symptomatic, <50% confined to bed  There were no vitals filed for this visit.  Wt Readings from Last 3 Encounters:  09/26/24 213 lb 9.6 oz (96.9 kg)  06/04/24 211 lb 10.3 oz (96 kg)  05/29/24 210 lb 3.2 oz (95.3 kg)   Physical Exam Constitutional:      Appearance: Normal appearance. She is obese.  Cardiovascular:     Heart sounds: Normal heart sounds.  Pulmonary:     Breath sounds: Normal breath sounds.  Chest:       Comments: No discrete nodules, masses, or lymphadenopathy. Lymphadenopathy:     Upper Body:     Right upper body: No supraclavicular, axillary or  pectoral adenopathy.     Left upper body: No supraclavicular, axillary or pectoral adenopathy.  Neurological:     General: No focal deficit present.     Mental Status: Mental status is at baseline.  Psychiatric:        Behavior: Behavior normal. Behavior is cooperative.      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 Kelsie 11/24/15   Dyspnea    Essential hypertension    History of pulmonary embolism 2017   unprovoked, long term anticoag with apixaban     Hyperlipidemia    Myoview  03/2021    Myoview  4/22: EF 50, no infarct or ischemia; low risk   Presence of permanent cardiac pacemaker    Type 2 diabetes mellitus (HCC)    Past Surgical History:  Procedure Laterality Date   BREAST BIOPSY Right 05/03/2022   DUCT ECTASIA WITH FOCAL PAPILLARY  EPITHELIAL HYPERPLASIA WITHOUT CYTOLOGIC ATYPIA-NEGATIVE FOR  MALIGNANCY   BREAST LUMPECTOMY Right 06/22/2022   CHOLECYSTECTOMY N/A 03/16/2022   Procedure: LAPAROSCOPIC CHOLECYSTECTOMY;  Surgeon: Kallie Manuelita BROCKS, MD;  Location: [facility];  Service: General;  Laterality: N/A;   COLONOSCOPY     3 SIMPLE ADENOMAS, AGE 44   COLONOSCOPY N/A 05/24/2018   Procedure: COLONOSCOPY;  Surgeon: Harvey Margo CROME, MD;  Location: [facility];  Service: Endoscopy;  Laterality: N/A;  1:00pm   ENDOSCOPIC RETROGRADE CHOLANGIOPANCREATOGRAPHY (ERCP) WITH PROPOFOL  N/A 03/15/2022   Procedure: ENDOSCOPIC RETROGRADE CHOLANGIOPANCREATOGRAPHY (ERCP) WITH PROPOFOL ;  Surgeon: Golda Claudis PENNER, MD;  Location: [facility];  Service: Endoscopy;  Laterality: N/A;  with sphincterotomy and stone retrieval   EP IMPLANTABLE DEVICE N/A 11/24/2015   Procedure: Pacemaker Implant;  Surgeon: Lynwood Kelsie, MD;  Location: [facility] INVASIVE CV LAB;  Service: Cardiovascular;  Laterality: N/A;    IR EXCHANGE BILIARY DRAIN  07/27/2021   IR EXCHANGE BILIARY DRAIN  08/23/2021   IR EXCHANGE BILIARY DRAIN  02/25/2022   IR PERC CHOLECYSTOSTOMY  07/08/2021   IR PERC CHOLECYSTOSTOMY  11/04/2021   IR RADIOLOGIST EVAL & MGMT  09/22/2021   JOINT REPLACEMENT     knees bilat.   LEFT HEART CATH AND CORONARY ANGIOGRAPHY N/A 07/02/2021   Procedure: LEFT HEART CATH AND CORONARY ANGIOGRAPHY;  Surgeon: Dann Candyce RAMAN, MD;  Location: [facility] INVASIVE CV LAB;  Service: Cardiovascular;  Laterality: N/A;   POLYPECTOMY  05/24/2018   Procedure: POLYPECTOMY;  Surgeon: Harvey Margo CROME, MD;  Location: [facility];  Service: Endoscopy;;  ascending and hepatic flexure, transverse   REMOVAL OF STONES N/A 03/15/2022   Procedure: STONE AND SLUDGE EXTRACTION;  Surgeon: Golda Claudis PENNER, MD;  Location: [facility];  Service: Endoscopy;  Laterality: N/A;   TEE WITHOUT CARDIOVERSION N/A 08/27/2021   Procedure: TRANSESOPHAGEAL ECHOCARDIOGRAM (TEE);  Surgeon: Mona Vinie BROCKS, MD;  Location: [facility] ENDOSCOPY;  Service: Cardiovascular;  Laterality: N/A;   TUBAL LIGATION      SOCIAL HISTORY:  Social History   Socioeconomic History   Marital status: Widowed    Spouse name: Not on file   Number of children: Not on file   Years of education: Not on file   Highest education level: Not on file  Occupational History   Occupation: retired  Tobacco Use   Smoking status: Never   Smokeless tobacco: Never  Vaping Use   Vaping status: Never Used  Substance and Sexual Activity   Alcohol use: No    Alcohol/week: 0.0 standard drinks of alcohol   Drug use: No   Sexual activity: Yes    Birth control/protection: Post-menopausal  Other Topics Concern   Not on file  Social History Narrative   MARRIED FOR 78 YRS. 5 KIDS: #4 PRESENT TODAY(AGE 62)   Social Drivers of Health   Tobacco Use: Low Risk (10/04/2024)   Patient History    Smoking Tobacco Use: Never    Smokeless Tobacco Use: Never    Passive Exposure: Not on file   Financial Resource Strain: Not on file  Food Insecurity: Not on file  Transportation Needs: Not on file  Physical Activity: Not on file  Stress: Not on file  Social Connections: Not on file  Intimate Partner Violence: Not on file  Depression (PHQ2-9): Not on file  Alcohol Screen: Not on file  Housing: Not on file  Utilities: Not on file  Health Literacy: Not on file    FAMILY HISTORY:  Family History  Problem Relation Age of Onset   Heart failure Mother    Hypertension Mother    Heart failure Sister    Hypertension Father    Cancer Sister    Dementia Sister    Colon cancer Neg Hx    Colon polyps Neg Hx     CURRENT MEDICATIONS:  Current Outpatient Medications  Medication Sig Dispense Refill   anastrozole  (ARIMIDEX ) 1 MG tablet TAKE 1 TABLET(1 MG) BY MOUTH DAILY 90 tablet 3   apixaban  (ELIQUIS ) 5 MG TABS tablet Take 1 tablet (5 mg total) by mouth 2 (two) times daily. 60 tablet 0   atorvastatin  (LIPITOR) 40 MG tablet Take 1 tablet (40 mg total) by mouth daily. 30 tablet 0   colestipol  (COLESTID ) 1 g tablet Take 1 tablet (1 g total) by mouth 2 (two) times daily. 60 tablet 2   Cyanocobalamin  (B-12 COMPLIANCE INJECTION IJ) Inject as directed every 30 (thirty) days.     ferrous sulfate  325 (65 FE) MG EC tablet Take 1 tablet (325 mg total) by mouth daily with breakfast. 90 tablet 3   furosemide  (LASIX ) 40 MG tablet Take 1 tablet (40 mg) daily Monday, Wednesday, Friday. Take 2 tablets (80 mg total) all other days. 140 tablet 3   glucose blood test strip Use to check blood glucose fasting , before lunch and dinner and after your largest meal 120 each 0   Insulin  Pen Needle (BD AUTOSHIELD DUO) 30G X 5 MM MISC 30 gauge x 3/16 (pen needle,diabetic dual safty)     Lancets (ONETOUCH ULTRASOFT) lancets Use to check glucose 4 x daily 120 each 0   LANTUS  SOLOSTAR 100 UNIT/ML Solostar Pen Inject 10 Units into the skin at bedtime. (Patient taking differently: Inject 25 Units into the  skin at  bedtime.) 15 mL 0   losartan  (COZAAR ) 100 MG tablet Take 100 mg by mouth daily.     metFORMIN  (GLUCOPHAGE ) 1000 MG tablet TAKE (1) TABLET BY MOUTH TWICE DAILY. 60 tablet 0   metoprolol  succinate (TOPROL -XL) 50 MG 24 hr tablet TAKE 1 TABLET(50 MG) BY MOUTH IN THE MORNING AND AT BEDTIME WITH OR IMMEDIATELY FOLLOWING A MEAL 180 tablet 0   potassium chloride  (KLOR-CON ) 10 MEQ tablet Take 10 mEq by mouth daily.     No current facility-administered medications for this visit.    ALLERGIES:  Allergies  Allergen Reactions   Penicillins Hives, Itching and Other (See Comments)    Has tolerated ceftriaxone  7/22 Severe rash involving mucus membranes or skin necrosis    LABORATORY DATA:  I have reviewed the labs as listed.     Latest Ref Rng & Units 12/04/2024    2:22 PM 05/01/2024    2:46 PM 04/28/2024    6:09 PM  CBC  WBC 4.0 - 10.5 K/uL 7.5  6.3  8.3   Hemoglobin 12.0 - 15.0 g/dL 88.1  88.4  87.4   Hematocrit 36.0 - 46.0 % 38.1  35.6  40.5   Platelets 150 - 400 K/uL 226  209  193       Latest Ref Rng & Units 12/04/2024    2:22 PM 05/01/2024    2:46 PM 04/28/2024    6:09 PM  CMP  Glucose 70 - 99 mg/dL 742  816  745   BUN 8 - 23 mg/dL 17  16  23    Creatinine 0.44 - 1.00 mg/dL 8.92  8.96  8.49   Sodium 135 - 145 mmol/L 141  139  140   Potassium 3.5 - 5.1 mmol/L 4.1  3.7  4.3   Chloride 98 - 111 mmol/L 105  102  104   CO2 22 - 32 mmol/L 21  26  24    Calcium  8.9 - 10.3 mg/dL 8.9  8.7  9.1   Total Protein 6.5 - 8.1 g/dL 6.8  6.9  7.7   Total Bilirubin 0.0 - 1.2 mg/dL 0.5  0.7  0.5   Alkaline Phos 38 - 126 U/L 80  67  73   AST 15 - 41 U/L 31  16  18    ALT 0 - 44 U/L 30  9  10      DIAGNOSTIC IMAGING:  I have independently reviewed the scans and discussed with the patient. CUP PACEART REMOTE DEVICE CHECK Result Date: 11/13/2024 PPM Scheduled remote reviewed. Normal device function.  Presenting rhythm: AF-VP. Total AF burden 3.1%, however, has been 100% since August on trend. Known AF, on  Eliquis . Routing to triage for increase in AF from previous, now persistent. Next remote transmission per protocol. - CS, CVRS    WRAP UP:  All questions were answered. The patient knows to call the clinic with any problems, questions or concerns.  Medical decision making: Moderate  Time spent on visit: I spent 20 minutes counseling the patient face to face. The total time spent in the appointment was 30 minutes and more than 50% was on counseling.  Pleasant CHRISTELLA Barefoot, PA-C  ***

## 2024-12-09 NOTE — Progress Notes (Signed)
°  Echocardiogram 2D Echocardiogram has been performed.  Koleen KANDICE Popper, RDCS 12/09/2024, 11:35 AM

## 2024-12-10 ENCOUNTER — Inpatient Hospital Stay: Admitting: Physician Assistant

## 2024-12-10 VITALS — BP 132/69 | HR 70 | Temp 99.8°F | Resp 18 | Ht 65.0 in | Wt 214.0 lb

## 2024-12-10 DIAGNOSIS — D508 Other iron deficiency anemias: Secondary | ICD-10-CM | POA: Diagnosis not present

## 2024-12-10 DIAGNOSIS — Z79811 Long term (current) use of aromatase inhibitors: Secondary | ICD-10-CM

## 2024-12-10 DIAGNOSIS — D0581 Other specified type of carcinoma in situ of right breast: Secondary | ICD-10-CM

## 2024-12-10 DIAGNOSIS — D0511 Intraductal carcinoma in situ of right breast: Secondary | ICD-10-CM | POA: Diagnosis not present

## 2024-12-10 DIAGNOSIS — E538 Deficiency of other specified B group vitamins: Secondary | ICD-10-CM | POA: Diagnosis not present

## 2024-12-10 DIAGNOSIS — D649 Anemia, unspecified: Secondary | ICD-10-CM

## 2024-12-10 DIAGNOSIS — Z1231 Encounter for screening mammogram for malignant neoplasm of breast: Secondary | ICD-10-CM

## 2024-12-10 NOTE — Patient Instructions (Signed)
 [facility] at [facility] **VISIT SUMMARY & IMPORTANT INSTRUCTIONS **   You were seen today by Pleasant Barefoot PA-C for your follow-up visit.    HISTORY OF BREAST CANCER Your most recent mammogram, physical exam, and labs did not show any evidence of recurrent breast cancer at this time. Continue taking anastrozole  (breast cancer pill) daily. Your next mammogram will be due in July 2026 (after your next visit here at the [facility]).  OSTEOPENIA Your bone density scan showed increased weakness in your bones. This is due to your breast cancer treatment. You do not need treatment of your osteopenia at this time, but we will recheck a bone density scan in 2027 to make sure things are not getting worse.  ANEMIA You have mild anemia (low red blood cells). This is due to iron deficiency and vitamin B12 deficiency. You also have some mild chronic kidney disease which is contributing to your anemia. Please continue taking iron pill (ferrous sulfate  325 mg) every day.  Take this with a glass of orange juice to help your body absorb it better. Continue vitamin B12 injections once a month.  FOLLOW-UP APPOINTMENT: Office visit in 6 months  ** Thank you for trusting me with your healthcare!  I strive to provide all of my patients with quality care at each visit.  If you receive a survey for this visit, I would be so grateful to you for taking the time to provide feedback.  Thank you in advance!  ~ Beacher Every                                        Dr. Mickiel Davonna Pleasant Barefoot, PA-C      Delon Hope, NP   - - - - - - - - - - - - - - - - - -     Thank you for choosing [facility] at [facility] to provide your oncology and hematology care.  To afford each patient quality time with our provider, please arrive at least 15 minutes before your scheduled appointment time.   If you have a lab appointment with the Cancer Center please come  in thru the Main Entrance and check in at the main information desk.  You need to re-schedule your appointment should you arrive 10 or more minutes late.  We strive to give you quality time with our providers, and arriving late affects you and other patients whose appointments are after yours.  Also, if you no show three or more times for appointments you may be dismissed from the clinic at the providers discretion.     Again, thank you for choosing [facility].  Our hope is that these requests will decrease the amount of time that you wait before being seen by our physicians.       _____________________________________________________________  Should you have questions after your visit to [facility], please contact our office at [phone] and follow the prompts.  Our office hours are 8:00 a.m. and 4:30 p.m. Monday - Friday.  Please note that voicemails left after 4:00 p.m. may not be returned until the following business day.  We are closed weekends and major holidays.  You do have access to a nurse 24-7, just call the main number to the clinic [phone] and do not press any  options, hold on the line and a nurse will answer the phone.    For prescription refill requests, have your pharmacy contact our office and allow 72 hours.

## 2024-12-11 ENCOUNTER — Inpatient Hospital Stay: Admitting: Physician Assistant

## 2024-12-23 ENCOUNTER — Encounter: Payer: Self-pay | Admitting: *Deleted

## 2024-12-23 ENCOUNTER — Encounter: Payer: Self-pay | Admitting: Podiatry

## 2024-12-23 ENCOUNTER — Ambulatory Visit: Admitting: Podiatry

## 2024-12-23 VITALS — Ht 65.0 in | Wt 214.0 lb

## 2024-12-23 DIAGNOSIS — M79675 Pain in left toe(s): Secondary | ICD-10-CM | POA: Diagnosis not present

## 2024-12-23 DIAGNOSIS — M79674 Pain in right toe(s): Secondary | ICD-10-CM | POA: Diagnosis not present

## 2024-12-23 DIAGNOSIS — B351 Tinea unguium: Secondary | ICD-10-CM | POA: Diagnosis not present

## 2024-12-23 DIAGNOSIS — E119 Type 2 diabetes mellitus without complications: Secondary | ICD-10-CM | POA: Diagnosis not present

## 2024-12-23 NOTE — Progress Notes (Signed)
 This patient returns to my office for at risk foot care.  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 This patient is unable to cut nails herself since the patient cannot reach her nails.These nails are painful walking and wearing shoes.  This patient presents for at risk foot care today.  General Appearance  Alert, conversant and in no acute stress.  Vascular  Dorsalis pedis and posterior tibial  pulses are  weakly palpable  bilaterally.  Capillary return is within normal limits  bilaterally. Temperature is within normal limits  bilaterally.  Neurologic  Senn-Weinstein monofilament wire test within normal limits  bilaterally. Muscle power within normal limits bilaterally.  Nails Thick disfigured discolored nails with subungual debris  from hallux to fifth toes bilaterally. No evidence of bacterial infection or drainage bilaterally.  Orthopedic  No limitations of motion  feet .  No crepitus or effusions noted.  No bony pathology or digital deformities noted.  Skin  normotropic skin with no porokeratosis noted bilaterally.  No signs of infections or ulcers noted.     Onychomycosis  Pain in right toes  Pain in left toes  Consent was obtained for treatment procedures.   Mechanical debridement of nails 1-5  bilaterally performed with a nail nipper.  Filed with dremel without incident.    Return office visit    4 months                  Told patient to return for periodic foot care and evaluation due to potential at risk complications.   Helane Gunther DPM

## 2024-12-25 ENCOUNTER — Encounter: Payer: Self-pay | Admitting: Internal Medicine

## 2024-12-25 ENCOUNTER — Ambulatory Visit: Admitting: Internal Medicine

## 2024-12-25 VITALS — BP 110/70 | HR 70 | Ht 65.0 in | Wt 211.6 lb

## 2024-12-25 DIAGNOSIS — R0609 Other forms of dyspnea: Secondary | ICD-10-CM | POA: Diagnosis not present

## 2024-12-25 DIAGNOSIS — I442 Atrioventricular block, complete: Secondary | ICD-10-CM

## 2024-12-25 LAB — CUP PACEART INCLINIC DEVICE CHECK
Date Time Interrogation Session: 20251231215747
Implantable Lead Connection Status: 753985
Implantable Lead Connection Status: 753985
Implantable Lead Implant Date: 20161129
Implantable Lead Implant Date: 20161129
Implantable Lead Location: 753859
Implantable Lead Location: 753860
Implantable Lead Model: 5076
Implantable Pulse Generator Implant Date: 20161129
Pulse Gen Model: 2240
Pulse Gen Serial Number: 7826037

## 2024-12-25 MED ORDER — AMIODARONE HCL 200 MG PO TABS: 200.0000 mg | ORAL_TABLET | Freq: Two times a day (BID) | ORAL | 3 refills | Status: AC

## 2024-12-25 NOTE — Progress Notes (Unsigned)
 "     HPI Natalie Castro presents today for ongoing evaluation and management of her PPM. She is a pleasant 83 yo woman with a h/o HTN and DM who developed syncope and sob and was found to have CHB, and underwent PPM insertion.  She was hospitalized remotely with pulmonary emboli. No chest pain. No bleeding. She admits to dietary indiscretion. She has occaisional palpitations.  She does c/o sob. She does feel weak and this correlates with her developing rate controlled atrial fib.  Allergies[1]   Current Outpatient Medications  Medication Sig Dispense Refill   anastrozole  (ARIMIDEX ) 1 MG tablet TAKE 1 TABLET(1 MG) BY MOUTH DAILY 90 tablet 3   apixaban  (ELIQUIS ) 5 MG TABS tablet Take 1 tablet (5 mg total) by mouth 2 (two) times daily. 60 tablet 0   atorvastatin  (LIPITOR) 40 MG tablet Take 1 tablet (40 mg total) by mouth daily. 30 tablet 0   colestipol  (COLESTID ) 1 g tablet Take 1 tablet (1 g total) by mouth 2 (two) times daily. 60 tablet 2   Cyanocobalamin  (B-12 COMPLIANCE INJECTION IJ) Inject as directed every 30 (thirty) days.     ferrous sulfate  325 (65 FE) MG EC tablet Take 1 tablet (325 mg total) by mouth daily with breakfast. 90 tablet 3   furosemide  (LASIX ) 40 MG tablet Take 1 tablet (40 mg) daily Monday, Wednesday, Friday. Take 2 tablets (80 mg total) all other days. 140 tablet 3   glucose blood test strip Use to check blood glucose fasting , before lunch and dinner and after your largest meal 120 each 0   Insulin  Pen Needle (BD AUTOSHIELD DUO) 30G X 5 MM MISC 30 gauge x 3/16 (pen needle,diabetic dual safty)     Lancets (ONETOUCH ULTRASOFT) lancets Use to check glucose 4 x daily 120 each 0   LANTUS  SOLOSTAR 100 UNIT/ML Solostar Pen Inject 10 Units into the skin at bedtime. (Patient taking differently: Inject 25 Units into the skin at bedtime.) 15 mL 0   losartan  (COZAAR ) 100 MG tablet Take 100 mg by mouth daily.     metFORMIN  (GLUCOPHAGE ) 1000 MG tablet TAKE (1) TABLET BY MOUTH TWICE  DAILY. 60 tablet 0   metoprolol  succinate (TOPROL -XL) 50 MG 24 hr tablet TAKE 1 TABLET(50 MG) BY MOUTH IN THE MORNING AND AT BEDTIME WITH OR IMMEDIATELY FOLLOWING A MEAL 180 tablet 0   potassium chloride  (KLOR-CON ) 10 MEQ tablet Take 10 mEq by mouth daily.     No current facility-administered medications for this visit.     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 Kelsie 11/24/15   Dyspnea    Essential hypertension    History of pulmonary embolism 2017   unprovoked, long term anticoag with apixaban     Hyperlipidemia    Myoview  03/2021    Myoview  4/22: EF 50, no infarct or ischemia; low risk   Presence of permanent cardiac pacemaker    Type 2 diabetes mellitus (HCC)     ROS:   All systems reviewed and negative except as noted in the HPI.   Past Surgical History:  Procedure Laterality Date   BREAST BIOPSY Right 05/03/2022   DUCT ECTASIA WITH FOCAL PAPILLARY  EPITHELIAL HYPERPLASIA WITHOUT CYTOLOGIC ATYPIA-NEGATIVE FOR  MALIGNANCY  BREAST LUMPECTOMY Right 06/22/2022   CHOLECYSTECTOMY N/A 03/16/2022   Procedure: LAPAROSCOPIC CHOLECYSTECTOMY;  Surgeon: Kallie Manuelita BROCKS, MD;  Location: [facility];  Service: General;  Laterality: N/A;   COLONOSCOPY     3 SIMPLE ADENOMAS, AGE 61   COLONOSCOPY N/A 05/24/2018   Procedure: COLONOSCOPY;  Surgeon: Harvey Margo CROME, MD;  Location: [facility];  Service: Endoscopy;  Laterality: N/A;  1:00pm   ENDOSCOPIC RETROGRADE CHOLANGIOPANCREATOGRAPHY (ERCP) WITH PROPOFOL  N/A 03/15/2022   Procedure: ENDOSCOPIC RETROGRADE CHOLANGIOPANCREATOGRAPHY (ERCP) WITH PROPOFOL ;  Surgeon: Golda Claudis PENNER, MD;  Location: [facility];  Service: Endoscopy;  Laterality: N/A;  with sphincterotomy and stone retrieval   EP IMPLANTABLE DEVICE N/A 11/24/2015   Procedure: Pacemaker Implant;  Surgeon:  Lynwood Rakers, MD;  Location: [facility] INVASIVE CV LAB;  Service: Cardiovascular;  Laterality: N/A;   IR EXCHANGE BILIARY DRAIN  07/27/2021   IR EXCHANGE BILIARY DRAIN  08/23/2021   IR EXCHANGE BILIARY DRAIN  02/25/2022   IR PERC CHOLECYSTOSTOMY  07/08/2021   IR PERC CHOLECYSTOSTOMY  11/04/2021   IR RADIOLOGIST EVAL & MGMT  09/22/2021   JOINT REPLACEMENT     knees bilat.   LEFT HEART CATH AND CORONARY ANGIOGRAPHY N/A 07/02/2021   Procedure: LEFT HEART CATH AND CORONARY ANGIOGRAPHY;  Surgeon: Dann Candyce RAMAN, MD;  Location: [facility] INVASIVE CV LAB;  Service: Cardiovascular;  Laterality: N/A;   POLYPECTOMY  05/24/2018   Procedure: POLYPECTOMY;  Surgeon: Harvey Margo CROME, MD;  Location: [facility];  Service: Endoscopy;;  ascending and hepatic flexure, transverse   REMOVAL OF STONES N/A 03/15/2022   Procedure: STONE AND SLUDGE EXTRACTION;  Surgeon: Golda Claudis PENNER, MD;  Location: [facility];  Service: Endoscopy;  Laterality: N/A;   TEE WITHOUT CARDIOVERSION N/A 08/27/2021   Procedure: TRANSESOPHAGEAL ECHOCARDIOGRAM (TEE);  Surgeon: Mona Vinie BROCKS, MD;  Location: [facility] ENDOSCOPY;  Service: Cardiovascular;  Laterality: N/A;   TUBAL LIGATION       Family History  Problem Relation Age of Onset   Heart failure Mother    Hypertension Mother    Heart failure Sister    Hypertension Father    Cancer Sister    Dementia Sister    Colon cancer Neg Hx    Colon polyps Neg Hx      Social History   Socioeconomic History   Marital status: Widowed    Spouse name: Not on file   Number of children: Not on file   Years of education: Not on file   Highest education level: Not on file  Occupational History   Occupation: retired  Tobacco Use   Smoking status: Never   Smokeless tobacco: Never  Vaping Use   Vaping status: Never Used  Substance and Sexual Activity   Alcohol use: No    Alcohol/week: 0.0 standard drinks of alcohol   Drug use: No   Sexual activity: Yes    Birth control/protection:  Post-menopausal  Other Topics Concern   Not on file  Social History Narrative   MARRIED FOR 90 YRS. 5 KIDS: #4 PRESENT TODAY(AGE 67)   Social Drivers of Health   Tobacco Use: Low Risk (12/23/2024)   Patient History    Smoking Tobacco Use: Never    Smokeless Tobacco Use: Never    Passive Exposure: Not on file  Financial Resource Strain: Not on file  Food Insecurity: Not on file  Transportation Needs: Not on file  Physical Activity: Not on file  Stress: Not on file  Social Connections: Not on file  Intimate Partner Violence: Not on file  Depression (PHQ2-9): Low Risk (12/10/2024)   Depression (PHQ2-9)    PHQ-2 Score: 0  Alcohol Screen: Not on file  Housing: Not on file  Utilities: Not on file  Health Literacy: Not on file     BP 110/70 (BP Location: Left Arm, Patient Position: Sitting, Cuff Size: Large)   Pulse 70   Ht 5' 5 (1.651 m)   Wt 211 lb 9.6 oz (96 kg)   SpO2 97%   BMI 35.21 kg/m   Physical Exam:  Well appearing NAD HEENT: Unremarkable Neck:  No JVD, no thyromegally Lymphatics:  No adenopathy Back:  No CVA tenderness Lungs:  Clear with no wheezes HEART:  Regular rate rhythm, no murmurs, no rubs, no clicks Abd:  soft, positive bowel sounds, no organomegally, no rebound, no guarding Ext:  2 plus pulses, no edema, no cyanosis, no clubbing Skin:  No rashes no nodules Neuro:  CN II through XII intact, motor grossly intact  EKG - atrial fib with ventricular pacing  DEVICE  Normal device function.  See PaceArt for details. She has been in atrial fib since late August.   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 Her symptoms have worsened. See below. Heart block - she is asymptomatic s/p PPM insertion. Obesity - she is encouraged to lose weight. Atrial fib - we discussed the treatment options in detail. She is not a great candidate for dofetilide due to her QT. I will start her on amio 200 bid and plan a DCCV in 5-6 weeks. She is  strongly encouraged not to miss any of her meds.    Natalie Daschel Roughton,MD     [1]  Allergies Allergen Reactions   Penicillins Hives, Itching and Other (See Comments)    Has tolerated ceftriaxone  7/22 Severe rash involving mucus membranes or skin necrosis   "

## 2024-12-25 NOTE — Patient Instructions (Signed)
 Medication Instructions:  Your physician has recommended you make the following change in your medication:   Start amiodarone 200mg  twice daily  Lab Work: None ordered.  You may go to any [facility] Location for your lab work:  [facility] - [address] ([facility]) - [address] [phone] [address]   - [address]   [place]  - [address]   [place] - [address] - [address] [employer] [place]  - [address] ([facility]   If you have labs (blood work) drawn today and your tests are completely normal, you will receive your results only by: [employer] (if you have MyChart)  If you have any lab test that is abnormal or we need to change your treatment, we will call you or send a MyChart message to review the results.  Testing/Procedures:    Dear Natalie Castro  You are scheduled for a Cardioversion on Tuesday, February 3 with Dr. Kriste.  Please arrive at the [facility] (Main Entrance A) at [facility]: [address] [place], [place] [address] at 6:30 AM (This time is 1 hour(s) before your procedure to ensure your preparation).   Free valet parking service is available. You will check in at ADMITTING.   *Please Note: You will receive a call the day before your procedure to confirm the appointment time. That time may have changed from the original time based on the schedule for that day.*   DIET:  Nothing to eat or drink after midnight except a sip of water  with medications (see medication instructions below)  MEDICATION INSTRUCTIONS: !!IF ANY NEW MEDICATIONS ARE STARTED AFTER TODAY, PLEASE NOTIFY YOUR PROVIDER AS SOON AS POSSIBLE!!  FYI: Medications such as Semaglutide (Ozempic, Wegovy), Tirzepatide (Mounjaro, Zepbound), Dulaglutide (Trulicity), etc (GLP1 agonists) AND Canagliflozin (Invokana), Dapagliflozin  (Farxiga), Empagliflozin (Jardiance), Ertugliflozin (Steglatro), Bexagliflozin [employer]) or any combination with one of these drugs such as Invokamet (Canagliflozin/Metformin ), Synjardy (Empagliflozin/Metformin ), etc (SGLT2 inhibitors) must be held around the time of a procedure. This is not a comprehensive list of all of these drugs. Please review all of your medications and talk to your provider if you take any one of these. If you are not sure, ask your provider.       Continue taking your anticoagulant (blood thinner): Apixaban  (Eliquis ).  You will need to continue this after your procedure until you are told by your provider that it is safe to stop.    LABS:   Come to the lab at the [facility] ([address], [place], 1st Floor) between the hours of 8:00 am and 4:30 pm. You do NOT have to be fasting.  FYI:  For your safety, and to allow us  to monitor your vital signs accurately during the surgery/procedure we request: If you have artificial nails, gel coating, SNS etc, please have those removed prior to your surgery/procedure. Not having the nail coverings /polish removed may result in cancellation or delay of your surgery/procedure.  Your support person will be asked to wait in the waiting room during your procedure.  It is OK to have someone drop you off and come back when you are ready to be discharged.  You cannot drive after the procedure and will need someone to drive you home.  Bring your insurance cards.  *  Special Note: Every effort is made to have your procedure done on time. Occasionally there are emergencies that occur at the hospital that may cause delays. Please be patient if a delay does occur.      Follow-Up: At [facility], you and your health needs are our priority.  As part of our continuing mission to provide you with exceptional heart care, we have created designated Provider Care Teams.  These Care Teams include  your primary Cardiologist (physician) and Advanced Practice Providers (APPs -  Physician Assistants and Nurse Practitioners) who all work together to provide you with the care you need, when you need it.

## 2024-12-27 ENCOUNTER — Telehealth: Payer: Self-pay | Admitting: Internal Medicine

## 2024-12-27 NOTE — Telephone Encounter (Signed)
" °*  STAT* If patient is at the pharmacy, call can be transferred to refill team.   1. Which medications need to be refilled? (please list name of each medication and dose if known) furosemide  (LASIX ) 40 MG tablet    2. Would you like to learn more about the convenience, safety, & potential cost savings by using the [facility]? No    3. Are you open to using the [facility] (Type [facility]. No    4. Which pharmacy/location (including street and city if local pharmacy) is medication to be sent to?[facility] - [place], [place] - [address]    5. Do they need a 30 day or 90 day supply? 90 day   Pt is out of medication   "

## 2024-12-30 MED ORDER — FUROSEMIDE 40 MG PO TABS: ORAL_TABLET | ORAL | 3 refills | Status: AC

## 2024-12-31 ENCOUNTER — Telehealth: Payer: Self-pay | Admitting: Internal Medicine

## 2024-12-31 ENCOUNTER — Encounter: Payer: Self-pay | Admitting: Oncology

## 2024-12-31 NOTE — Telephone Encounter (Signed)
 Spoke with Sonny. Went over instructions including needing the labs. Will reach out to [place] office to see if they can do a EKG Nurse visit and labs the day of for patient. Sonny requested her sister Antoine [phone] be the contact for the next few weeks as Sonny will be out of town.

## 2024-12-31 NOTE — Telephone Encounter (Signed)
 Pt daughter asking when pt needs to have lab work done in preparation for cardioversion. Please advise.

## 2024-12-31 NOTE — Telephone Encounter (Signed)
 Pt scheduled for nurse visit on 01/02/25 at 4 pm.

## 2024-12-31 NOTE — Telephone Encounter (Signed)
 Returned call to [facility] to schedule nurse visit. No answer. Left msg to call back.

## 2025-01-02 ENCOUNTER — Ambulatory Visit: Attending: Cardiology | Admitting: *Deleted

## 2025-01-02 DIAGNOSIS — I442 Atrioventricular block, complete: Secondary | ICD-10-CM

## 2025-01-02 DIAGNOSIS — I10 Essential (primary) hypertension: Secondary | ICD-10-CM | POA: Diagnosis not present

## 2025-01-02 NOTE — Progress Notes (Signed)
 Pt started on Amiodarone  200 mg BID on 12/25/24. She still feels SOB when walking.

## 2025-01-03 LAB — CBC
Hematocrit: 39.5 % (ref 34.0–46.6)
Hemoglobin: 12.5 g/dL (ref 11.1–15.9)
MCH: 28.5 pg (ref 26.6–33.0)
MCHC: 31.6 g/dL (ref 31.5–35.7)
MCV: 90 fL (ref 79–97)
Platelets: 231 x10E3/uL (ref 150–450)
RBC: 4.38 x10E6/uL (ref 3.77–5.28)
RDW: 13.7 % (ref 11.7–15.4)
WBC: 8.5 x10E3/uL (ref 3.4–10.8)

## 2025-01-03 LAB — BASIC METABOLIC PANEL WITH GFR
BUN/Creatinine Ratio: 14 (ref 12–28)
BUN: 18 mg/dL (ref 8–27)
CO2: 22 mmol/L (ref 20–29)
Calcium: 8.9 mg/dL (ref 8.7–10.3)
Chloride: 103 mmol/L (ref 96–106)
Creatinine, Ser: 1.29 mg/dL — ABNORMAL HIGH (ref 0.57–1.00)
Glucose: 144 mg/dL — ABNORMAL HIGH (ref 70–99)
Potassium: 4 mmol/L (ref 3.5–5.2)
Sodium: 142 mmol/L (ref 134–144)
eGFR: 41 mL/min/1.73 — ABNORMAL LOW

## 2025-01-07 ENCOUNTER — Inpatient Hospital Stay: Attending: Hematology

## 2025-01-07 VITALS — BP 115/52 | HR 70 | Temp 97.6°F | Resp 20

## 2025-01-07 DIAGNOSIS — E538 Deficiency of other specified B group vitamins: Secondary | ICD-10-CM | POA: Diagnosis present

## 2025-01-07 MED ORDER — CYANOCOBALAMIN 1000 MCG/ML IJ SOLN
1000.0000 ug | Freq: Once | INTRAMUSCULAR | Status: AC
  Administered 2025-01-07: 1000 ug via INTRAMUSCULAR
  Filled 2025-01-07: qty 1

## 2025-01-07 NOTE — Progress Notes (Signed)
 Patient tolerated B12 injection with no complaints voiced.  Site clean and dry with no bruising or swelling noted at site.  See MAR for details.  Band aid applied.  Patient stable during and after injection.  Vss with discharge and left in satisfactory condition with no s/s of distress noted. All follow ups as scheduled.   Natalie Castro

## 2025-01-14 ENCOUNTER — Telehealth: Payer: Self-pay | Admitting: Student in an Organized Health Care Education/Training Program

## 2025-01-14 NOTE — Telephone Encounter (Signed)
-----   Message from [facility] April G sent at 01/07/2025  2:18 PM EST ----- Regarding: Appt Cassie - Do you schedule for [place]?? ----- Message ----- From: Almetta Donnice LABOR, MD Sent: 01/05/2025   8:47 AM EST To: Jayson KANDICE Sierras, MD; Reynaldo KANDICE Pinnix, LPN#  Thanks a ton, will get her taken care of.   Aldona can we get her f/u with me in [place]? Has DCCV scheduled 01/28/25 so can be after that.   Matt ----- Message ----- From: Sierras Jayson KANDICE, MD Sent: 01/02/2025   4:23 PM EST To: Donnice LABOR Almetta, MD; Reynaldo KANDICE Pinnix, LPN  Sent to me as DOD.  Long-term patient of Dr. Waddell recently evaluated in December 2025 and started on amiodarone  with plan for cardioversion over the next month.  He has now retired and it does not appear that she has a follow-up yet scheduled to continue ongoing therapy with EP.  Please schedule visit with Dr. Almetta.  ECG shows a ventricular paced rhythm with probable underlying atypical atrial flutter versus atrial tachycardia, heart rate controlled.

## 2025-01-14 NOTE — Telephone Encounter (Signed)
 Call x1; [email or web address] to schedule patient with Dr. Almetta ([address]) for follow up from 2/3 DCCV.

## 2025-01-15 ENCOUNTER — Encounter: Payer: Self-pay | Admitting: Oncology

## 2025-01-27 NOTE — Progress Notes (Signed)
 Pt. Called for pre-procedure instructions. Arrival time 0630 NPO after midnight explained. Instructed to take AM meds with sip of water  and confirmed blood thinner consistency. Instructed patient need for ride home and have responsible to be with them for 24 hrs.

## 2025-01-28 ENCOUNTER — Encounter (HOSPITAL_COMMUNITY): Payer: Self-pay | Admitting: Certified Registered Nurse Anesthetist

## 2025-01-28 ENCOUNTER — Ambulatory Visit (HOSPITAL_COMMUNITY)
Admission: RE | Admit: 2025-01-28 | Discharge: 2025-01-28 | Disposition: A | Source: Home / Self Care | Attending: Internal Medicine | Admitting: Internal Medicine

## 2025-01-28 ENCOUNTER — Encounter (HOSPITAL_COMMUNITY)
Admission: RE | Disposition: A | Discharge status: Home / Self Care | Payer: Self-pay | Source: Home / Self Care | Attending: Internal Medicine

## 2025-01-28 DIAGNOSIS — I4891 Unspecified atrial fibrillation: Secondary | ICD-10-CM

## 2025-01-28 NOTE — Plan of Care (Signed)
 Patient presented today for cardioversion but was found to be in normal sinus rhythm after her device was interrogated by the [facility] rep.  Findings were discussed with the patient and she was discharged in stable condition.  Thank you, [name], DO

## 2025-01-28 NOTE — Progress Notes (Signed)
 Pt arrives for cardioversion.  Found to be in NSR.  Natalie Castro, [employer] rep interrogated pacemaker.  Copy printed.  Pt informed and understands.

## 2025-01-30 ENCOUNTER — Encounter (HOSPITAL_COMMUNITY): Payer: Self-pay | Admitting: Internal Medicine

## 2025-02-10 ENCOUNTER — Inpatient Hospital Stay: Attending: Hematology

## 2025-03-10 ENCOUNTER — Inpatient Hospital Stay: Attending: Hematology

## 2025-04-07 ENCOUNTER — Inpatient Hospital Stay: Attending: Hematology

## 2025-04-23 ENCOUNTER — Ambulatory Visit: Admitting: Podiatry

## 2025-05-05 ENCOUNTER — Inpatient Hospital Stay: Attending: Hematology

## 2025-06-02 ENCOUNTER — Inpatient Hospital Stay: Attending: Hematology

## 2025-06-30 ENCOUNTER — Inpatient Hospital Stay

## 2025-06-30 ENCOUNTER — Ambulatory Visit (HOSPITAL_COMMUNITY)

## 2025-07-07 ENCOUNTER — Inpatient Hospital Stay: Admitting: Physician Assistant

## 2025-07-28 ENCOUNTER — Inpatient Hospital Stay
